# Patient Record
Sex: Female | Born: 1935
Health system: Southern US, Community
[De-identification: ages and names within clinical notes are randomized; demographics above are authoritative.]

## PROBLEM LIST (undated history)

## (undated) DIAGNOSIS — N1330 Unspecified hydronephrosis: Secondary | ICD-10-CM

## (undated) DIAGNOSIS — K922 Gastrointestinal hemorrhage, unspecified: Secondary | ICD-10-CM

## (undated) DIAGNOSIS — M549 Dorsalgia, unspecified: Secondary | ICD-10-CM

## (undated) DIAGNOSIS — I10 Essential (primary) hypertension: Secondary | ICD-10-CM

## (undated) DIAGNOSIS — I471 Supraventricular tachycardia, unspecified: Secondary | ICD-10-CM

## (undated) DIAGNOSIS — F32A Depression, unspecified: Secondary | ICD-10-CM

## (undated) DIAGNOSIS — G43909 Migraine, unspecified, not intractable, without status migrainosus: Secondary | ICD-10-CM

## (undated) DIAGNOSIS — E785 Hyperlipidemia, unspecified: Secondary | ICD-10-CM

## (undated) DIAGNOSIS — R159 Full incontinence of feces: Secondary | ICD-10-CM

## (undated) DIAGNOSIS — R918 Other nonspecific abnormal finding of lung field: Secondary | ICD-10-CM

## (undated) DIAGNOSIS — R51 Headache: Secondary | ICD-10-CM

## (undated) DIAGNOSIS — F419 Anxiety disorder, unspecified: Secondary | ICD-10-CM

## (undated) DIAGNOSIS — M25569 Pain in unspecified knee: Secondary | ICD-10-CM

## (undated) DIAGNOSIS — R519 Headache, unspecified: Secondary | ICD-10-CM

## (undated) DIAGNOSIS — F329 Major depressive disorder, single episode, unspecified: Secondary | ICD-10-CM

## (undated) DIAGNOSIS — L821 Other seborrheic keratosis: Secondary | ICD-10-CM

## (undated) DIAGNOSIS — M810 Age-related osteoporosis without current pathological fracture: Secondary | ICD-10-CM

## (undated) HISTORY — DX: Full incontinence of feces: R15.9

## (undated) HISTORY — DX: Depression, unspecified: F32.A

## (undated) HISTORY — PX: ABDOMINAL HYSTERECTOMY: SHX81

## (undated) HISTORY — DX: Dorsalgia, unspecified: M54.9

## (undated) HISTORY — PX: TOTAL KNEE ARTHROPLASTY: SHX125

## (undated) HISTORY — DX: Supraventricular tachycardia, unspecified: I47.10

## (undated) HISTORY — DX: Unspecified hydronephrosis: N13.30

## (undated) HISTORY — DX: Gastrointestinal hemorrhage, unspecified: K92.2

## (undated) HISTORY — DX: Other seborrheic keratosis: L82.1

## (undated) HISTORY — DX: Headache: R51

## (undated) HISTORY — DX: Major depressive disorder, single episode, unspecified: F32.9

## (undated) HISTORY — DX: Essential (primary) hypertension: I10

## (undated) HISTORY — PX: OTHER SURGICAL HISTORY: SHX169

## (undated) HISTORY — DX: Age-related osteoporosis without current pathological fracture: M81.0

## (undated) HISTORY — DX: Pain in unspecified knee: M25.569

## (undated) HISTORY — DX: Anxiety disorder, unspecified: F41.9

## (undated) HISTORY — DX: Hyperlipidemia, unspecified: E78.5

## (undated) HISTORY — PX: BLADDER REPAIR: SHX76

## (undated) HISTORY — DX: Other nonspecific abnormal finding of lung field: R91.8

## (undated) HISTORY — PX: BASAL CELL CARCINOMA EXCISION: SHX1214

## (undated) HISTORY — PX: CHOLECYSTECTOMY: SHX55

## (undated) HISTORY — DX: Headache, unspecified: R51.9

## (undated) HISTORY — DX: Supraventricular tachycardia: I47.1

## (undated) HISTORY — PX: APPENDECTOMY: SHX54

## (undated) HISTORY — DX: Migraine, unspecified, not intractable, without status migrainosus: G43.909

## (undated) HISTORY — PX: TONSILLECTOMY: SUR1361

---

## 2002-11-02 ENCOUNTER — Encounter: Payer: Self-pay | Admitting: Internal Medicine

## 2002-11-02 ENCOUNTER — Encounter: Admission: RE | Admit: 2002-11-02 | Discharge: 2002-11-02 | Payer: Self-pay | Admitting: Internal Medicine

## 2002-11-19 ENCOUNTER — Inpatient Hospital Stay (HOSPITAL_COMMUNITY): Admission: EM | Admit: 2002-11-19 | Discharge: 2002-11-23 | Payer: Self-pay

## 2002-11-20 ENCOUNTER — Encounter: Payer: Self-pay | Admitting: Surgery

## 2002-11-21 ENCOUNTER — Encounter: Payer: Self-pay | Admitting: Surgery

## 2002-11-22 ENCOUNTER — Encounter: Payer: Self-pay | Admitting: Surgery

## 2002-12-03 ENCOUNTER — Encounter: Admission: RE | Admit: 2002-12-03 | Discharge: 2002-12-03 | Payer: Self-pay | Admitting: Surgery

## 2002-12-03 ENCOUNTER — Encounter: Payer: Self-pay | Admitting: Surgery

## 2003-03-05 ENCOUNTER — Encounter: Payer: Self-pay | Admitting: Gastroenterology

## 2003-03-05 ENCOUNTER — Encounter: Payer: Self-pay | Admitting: Internal Medicine

## 2003-03-18 ENCOUNTER — Ambulatory Visit (HOSPITAL_COMMUNITY): Admission: RE | Admit: 2003-03-18 | Discharge: 2003-03-18 | Payer: Self-pay | Admitting: Gastroenterology

## 2003-04-04 ENCOUNTER — Ambulatory Visit (HOSPITAL_COMMUNITY): Admission: RE | Admit: 2003-04-04 | Discharge: 2003-04-04 | Payer: Self-pay | Admitting: Gastroenterology

## 2003-04-04 ENCOUNTER — Encounter: Payer: Self-pay | Admitting: Gastroenterology

## 2003-05-04 ENCOUNTER — Inpatient Hospital Stay (HOSPITAL_COMMUNITY): Admission: EM | Admit: 2003-05-04 | Discharge: 2003-05-06 | Payer: Self-pay | Admitting: Emergency Medicine

## 2003-05-20 ENCOUNTER — Ambulatory Visit (HOSPITAL_COMMUNITY): Admission: RE | Admit: 2003-05-20 | Discharge: 2003-05-20 | Payer: Self-pay | Admitting: Internal Medicine

## 2003-07-05 ENCOUNTER — Ambulatory Visit (HOSPITAL_COMMUNITY): Admission: RE | Admit: 2003-07-05 | Discharge: 2003-07-05 | Payer: Self-pay | Admitting: Gastroenterology

## 2003-07-08 ENCOUNTER — Inpatient Hospital Stay (HOSPITAL_COMMUNITY): Admission: AD | Admit: 2003-07-08 | Discharge: 2003-07-24 | Payer: Self-pay | Admitting: Surgery

## 2003-07-11 ENCOUNTER — Encounter (INDEPENDENT_AMBULATORY_CARE_PROVIDER_SITE_OTHER): Payer: Self-pay | Admitting: Specialist

## 2003-08-27 ENCOUNTER — Emergency Department (HOSPITAL_COMMUNITY): Admission: EM | Admit: 2003-08-27 | Discharge: 2003-08-28 | Payer: Self-pay | Admitting: Emergency Medicine

## 2003-10-07 ENCOUNTER — Inpatient Hospital Stay (HOSPITAL_COMMUNITY): Admission: EM | Admit: 2003-10-07 | Discharge: 2003-10-08 | Payer: Self-pay | Admitting: Emergency Medicine

## 2003-11-04 ENCOUNTER — Encounter: Admission: RE | Admit: 2003-11-04 | Discharge: 2003-11-04 | Payer: Self-pay | Admitting: Internal Medicine

## 2003-11-10 ENCOUNTER — Ambulatory Visit (HOSPITAL_COMMUNITY): Admission: RE | Admit: 2003-11-10 | Discharge: 2003-11-10 | Payer: Self-pay | Admitting: Internal Medicine

## 2004-02-27 ENCOUNTER — Ambulatory Visit: Payer: Self-pay | Admitting: Internal Medicine

## 2004-03-13 ENCOUNTER — Ambulatory Visit: Payer: Self-pay | Admitting: Internal Medicine

## 2004-03-18 ENCOUNTER — Ambulatory Visit: Payer: Self-pay | Admitting: Cardiology

## 2004-04-07 ENCOUNTER — Ambulatory Visit: Payer: Self-pay | Admitting: Internal Medicine

## 2004-04-16 ENCOUNTER — Ambulatory Visit: Payer: Self-pay | Admitting: Internal Medicine

## 2004-04-22 ENCOUNTER — Encounter: Admission: RE | Admit: 2004-04-22 | Discharge: 2004-04-22 | Payer: Self-pay | Admitting: Internal Medicine

## 2004-04-26 ENCOUNTER — Encounter: Admission: RE | Admit: 2004-04-26 | Discharge: 2004-04-26 | Payer: Self-pay | Admitting: Neurology

## 2004-04-28 ENCOUNTER — Ambulatory Visit: Payer: Self-pay | Admitting: Internal Medicine

## 2004-06-26 ENCOUNTER — Ambulatory Visit: Payer: Self-pay | Admitting: Internal Medicine

## 2004-07-12 ENCOUNTER — Inpatient Hospital Stay (HOSPITAL_COMMUNITY): Admission: EM | Admit: 2004-07-12 | Discharge: 2004-07-14 | Payer: Self-pay | Admitting: *Deleted

## 2004-07-23 ENCOUNTER — Ambulatory Visit: Payer: Self-pay | Admitting: Gastroenterology

## 2004-09-04 ENCOUNTER — Ambulatory Visit: Payer: Self-pay | Admitting: Gastroenterology

## 2004-09-23 ENCOUNTER — Other Ambulatory Visit: Admission: RE | Admit: 2004-09-23 | Discharge: 2004-09-23 | Payer: Self-pay | Admitting: Obstetrics and Gynecology

## 2004-10-08 ENCOUNTER — Ambulatory Visit: Payer: Self-pay | Admitting: Internal Medicine

## 2004-10-22 ENCOUNTER — Ambulatory Visit: Payer: Self-pay | Admitting: Licensed Clinical Social Worker

## 2004-11-05 ENCOUNTER — Ambulatory Visit: Payer: Self-pay | Admitting: Licensed Clinical Social Worker

## 2004-11-19 ENCOUNTER — Ambulatory Visit: Payer: Self-pay | Admitting: Internal Medicine

## 2004-12-15 ENCOUNTER — Encounter: Admission: RE | Admit: 2004-12-15 | Discharge: 2004-12-15 | Payer: Self-pay | Admitting: Internal Medicine

## 2004-12-28 ENCOUNTER — Encounter: Admission: RE | Admit: 2004-12-28 | Discharge: 2004-12-28 | Payer: Self-pay | Admitting: Internal Medicine

## 2004-12-31 ENCOUNTER — Ambulatory Visit: Payer: Self-pay | Admitting: Internal Medicine

## 2005-01-21 ENCOUNTER — Ambulatory Visit: Payer: Self-pay | Admitting: Internal Medicine

## 2005-02-08 ENCOUNTER — Ambulatory Visit: Payer: Self-pay | Admitting: Internal Medicine

## 2005-02-10 ENCOUNTER — Inpatient Hospital Stay (HOSPITAL_COMMUNITY): Admission: AD | Admit: 2005-02-10 | Discharge: 2005-02-11 | Payer: Self-pay | Admitting: Gastroenterology

## 2005-02-10 ENCOUNTER — Ambulatory Visit: Payer: Self-pay | Admitting: Gastroenterology

## 2005-02-11 ENCOUNTER — Ambulatory Visit: Payer: Self-pay | Admitting: Internal Medicine

## 2005-02-23 ENCOUNTER — Ambulatory Visit: Payer: Self-pay | Admitting: Cardiology

## 2005-02-26 ENCOUNTER — Ambulatory Visit: Payer: Self-pay | Admitting: Gastroenterology

## 2005-03-25 ENCOUNTER — Ambulatory Visit: Payer: Self-pay | Admitting: Gastroenterology

## 2005-03-29 ENCOUNTER — Ambulatory Visit: Payer: Self-pay | Admitting: Internal Medicine

## 2005-04-01 ENCOUNTER — Ambulatory Visit: Payer: Self-pay | Admitting: Internal Medicine

## 2005-05-03 ENCOUNTER — Ambulatory Visit: Payer: Self-pay | Admitting: Internal Medicine

## 2005-06-04 ENCOUNTER — Ambulatory Visit: Payer: Self-pay | Admitting: Internal Medicine

## 2005-08-04 ENCOUNTER — Ambulatory Visit: Payer: Self-pay | Admitting: Internal Medicine

## 2005-08-12 ENCOUNTER — Ambulatory Visit: Payer: Self-pay | Admitting: Internal Medicine

## 2005-08-17 ENCOUNTER — Emergency Department (HOSPITAL_COMMUNITY): Admission: EM | Admit: 2005-08-17 | Discharge: 2005-08-17 | Payer: Self-pay | Admitting: Emergency Medicine

## 2005-09-20 ENCOUNTER — Ambulatory Visit: Payer: Self-pay | Admitting: Internal Medicine

## 2005-10-27 ENCOUNTER — Ambulatory Visit: Payer: Self-pay | Admitting: Internal Medicine

## 2005-12-17 ENCOUNTER — Ambulatory Visit: Payer: Self-pay | Admitting: Internal Medicine

## 2006-01-10 ENCOUNTER — Encounter: Admission: RE | Admit: 2006-01-10 | Discharge: 2006-01-24 | Payer: Self-pay | Admitting: Sports Medicine

## 2006-01-10 ENCOUNTER — Ambulatory Visit: Payer: Self-pay | Admitting: Internal Medicine

## 2006-01-17 ENCOUNTER — Encounter: Admission: RE | Admit: 2006-01-17 | Discharge: 2006-01-17 | Payer: Self-pay | Admitting: Internal Medicine

## 2006-01-18 ENCOUNTER — Ambulatory Visit: Payer: Self-pay | Admitting: Internal Medicine

## 2006-01-25 ENCOUNTER — Encounter: Admission: RE | Admit: 2006-01-25 | Discharge: 2006-02-24 | Payer: Self-pay | Admitting: Sports Medicine

## 2006-02-10 ENCOUNTER — Ambulatory Visit: Payer: Self-pay | Admitting: Internal Medicine

## 2006-02-10 LAB — CONVERTED CEMR LAB
Folate: >20 ng/mL
Glucose, Bld: 95 mg/dL (ref 70–99)
HCT: 36.5 % (ref 36.0–46.0)
Platelets: 370 10*3/uL (ref 150–400)
TSH: 1.29 microintl units/mL (ref 0.35–5.50)
Total CK: 25 units/L (ref 7–177)
WBC: 5.4 10*3/uL (ref 4.5–10.5)

## 2006-02-24 ENCOUNTER — Ambulatory Visit: Payer: Self-pay | Admitting: Internal Medicine

## 2006-02-25 ENCOUNTER — Encounter: Admission: RE | Admit: 2006-02-25 | Discharge: 2006-03-29 | Payer: Self-pay | Admitting: Sports Medicine

## 2006-03-24 ENCOUNTER — Ambulatory Visit: Payer: Self-pay | Admitting: Internal Medicine

## 2006-04-19 HISTORY — PX: CATARACT EXTRACTION: SUR2

## 2006-04-26 DIAGNOSIS — I471 Supraventricular tachycardia, unspecified: Secondary | ICD-10-CM | POA: Insufficient documentation

## 2006-04-26 DIAGNOSIS — R42 Dizziness and giddiness: Secondary | ICD-10-CM | POA: Insufficient documentation

## 2006-04-26 DIAGNOSIS — E785 Hyperlipidemia, unspecified: Secondary | ICD-10-CM | POA: Insufficient documentation

## 2006-04-26 DIAGNOSIS — Z96659 Presence of unspecified artificial knee joint: Secondary | ICD-10-CM | POA: Insufficient documentation

## 2006-04-28 ENCOUNTER — Ambulatory Visit: Payer: Self-pay | Admitting: Internal Medicine

## 2006-06-07 ENCOUNTER — Ambulatory Visit: Payer: Self-pay | Admitting: Internal Medicine

## 2006-06-09 ENCOUNTER — Ambulatory Visit: Payer: Self-pay | Admitting: Internal Medicine

## 2006-06-11 ENCOUNTER — Emergency Department (HOSPITAL_COMMUNITY): Admission: EM | Admit: 2006-06-11 | Discharge: 2006-06-12 | Payer: Self-pay | Admitting: Emergency Medicine

## 2006-06-30 ENCOUNTER — Encounter
Admission: RE | Admit: 2006-06-30 | Discharge: 2006-09-28 | Payer: Self-pay | Admitting: Physical Medicine and Rehabilitation

## 2006-07-01 ENCOUNTER — Ambulatory Visit: Payer: Self-pay | Admitting: Physical Medicine and Rehabilitation

## 2006-07-04 ENCOUNTER — Ambulatory Visit (HOSPITAL_COMMUNITY): Admission: RE | Admit: 2006-07-04 | Discharge: 2006-07-04 | Payer: Self-pay | Admitting: Internal Medicine

## 2006-07-07 ENCOUNTER — Ambulatory Visit: Payer: Self-pay | Admitting: Internal Medicine

## 2006-08-01 ENCOUNTER — Ambulatory Visit (HOSPITAL_COMMUNITY)
Admission: RE | Admit: 2006-08-01 | Discharge: 2006-08-01 | Payer: Self-pay | Admitting: Physical Medicine and Rehabilitation

## 2006-08-08 ENCOUNTER — Encounter: Admission: RE | Admit: 2006-08-08 | Discharge: 2006-08-08 | Payer: Self-pay | Admitting: Internal Medicine

## 2006-08-20 ENCOUNTER — Ambulatory Visit: Payer: Self-pay | Admitting: Family Medicine

## 2006-08-22 ENCOUNTER — Ambulatory Visit: Payer: Self-pay | Admitting: Internal Medicine

## 2006-08-23 ENCOUNTER — Ambulatory Visit: Payer: Self-pay | Admitting: Physical Medicine and Rehabilitation

## 2006-08-24 ENCOUNTER — Ambulatory Visit: Payer: Self-pay | Admitting: Internal Medicine

## 2006-08-30 ENCOUNTER — Ambulatory Visit: Payer: Self-pay | Admitting: Internal Medicine

## 2006-08-30 LAB — CONVERTED CEMR LAB
ALT: 9 units/L (ref 0–40)
AST: 15 units/L (ref 0–37)
Albumin: 3.9 g/dL (ref 3.5–5.2)
Calcium: 9.6 mg/dL (ref 8.4–10.5)
Chloride: 101 meq/L (ref 96–112)
Eosinophils Absolute: 0.1 10*3/uL (ref 0.0–0.6)
Eosinophils Relative: 1.4 % (ref 0.0–5.0)
GFR calc non Af Amer: 58 mL/min
Glucose, Bld: 83 mg/dL (ref 70–99)
MCV: 97.2 fL (ref 78.0–100.0)
Platelets: 333 10*3/uL (ref 150–400)
RBC: 4.08 M/uL (ref 3.87–5.11)
WBC: 7.5 10*3/uL (ref 4.5–10.5)

## 2006-09-07 ENCOUNTER — Encounter: Payer: Self-pay | Admitting: Internal Medicine

## 2006-09-08 ENCOUNTER — Ambulatory Visit (HOSPITAL_BASED_OUTPATIENT_CLINIC_OR_DEPARTMENT_OTHER): Admission: RE | Admit: 2006-09-08 | Discharge: 2006-09-08 | Payer: Self-pay | Admitting: Urology

## 2006-09-16 ENCOUNTER — Encounter: Payer: Self-pay | Admitting: Internal Medicine

## 2006-09-19 ENCOUNTER — Telehealth: Payer: Self-pay | Admitting: Internal Medicine

## 2006-09-21 ENCOUNTER — Encounter: Payer: Self-pay | Admitting: Internal Medicine

## 2006-09-21 DIAGNOSIS — N1330 Unspecified hydronephrosis: Secondary | ICD-10-CM | POA: Insufficient documentation

## 2006-10-06 ENCOUNTER — Ambulatory Visit: Payer: Self-pay | Admitting: Internal Medicine

## 2006-10-06 ENCOUNTER — Encounter
Admission: RE | Admit: 2006-10-06 | Discharge: 2006-10-06 | Payer: Self-pay | Admitting: Physical Medicine and Rehabilitation

## 2006-10-06 DIAGNOSIS — R930 Abnormal findings on diagnostic imaging of skull and head, not elsewhere classified: Secondary | ICD-10-CM | POA: Insufficient documentation

## 2006-10-06 DIAGNOSIS — M545 Low back pain, unspecified: Secondary | ICD-10-CM | POA: Insufficient documentation

## 2006-10-06 LAB — CONVERTED CEMR LAB
Bilirubin Urine: NEGATIVE
Specific Gravity, Urine: 1.018 (ref 1.005–1.03)
Urine Glucose: NEGATIVE mg/dL
WBC, UA: NONE SEEN cells/hpf (ref <3)
pH: 6 (ref 5.0–8.0)

## 2006-10-07 ENCOUNTER — Encounter: Payer: Self-pay | Admitting: Internal Medicine

## 2006-10-11 ENCOUNTER — Encounter
Admission: RE | Admit: 2006-10-11 | Discharge: 2007-01-09 | Payer: Self-pay | Admitting: Physical Medicine & Rehabilitation

## 2006-10-13 ENCOUNTER — Ambulatory Visit: Payer: Self-pay | Admitting: Physical Medicine & Rehabilitation

## 2006-10-18 ENCOUNTER — Ambulatory Visit: Payer: Self-pay | Admitting: Internal Medicine

## 2006-10-26 ENCOUNTER — Encounter: Admission: RE | Admit: 2006-10-26 | Discharge: 2006-10-26 | Payer: Self-pay | Admitting: Internal Medicine

## 2006-11-02 ENCOUNTER — Encounter (INDEPENDENT_AMBULATORY_CARE_PROVIDER_SITE_OTHER): Payer: Self-pay | Admitting: *Deleted

## 2006-11-25 ENCOUNTER — Telehealth: Payer: Self-pay | Admitting: Internal Medicine

## 2006-11-30 ENCOUNTER — Telehealth: Payer: Self-pay | Admitting: Internal Medicine

## 2006-12-05 ENCOUNTER — Ambulatory Visit: Payer: Self-pay | Admitting: Physical Medicine & Rehabilitation

## 2006-12-14 ENCOUNTER — Ambulatory Visit: Payer: Self-pay | Admitting: Physical Medicine and Rehabilitation

## 2007-01-10 ENCOUNTER — Encounter
Admission: RE | Admit: 2007-01-10 | Discharge: 2007-04-10 | Payer: Self-pay | Admitting: Physical Medicine and Rehabilitation

## 2007-01-19 ENCOUNTER — Encounter: Admission: RE | Admit: 2007-01-19 | Discharge: 2007-01-19 | Payer: Self-pay | Admitting: Internal Medicine

## 2007-01-24 ENCOUNTER — Telehealth: Payer: Self-pay | Admitting: Internal Medicine

## 2007-02-03 ENCOUNTER — Ambulatory Visit: Payer: Self-pay | Admitting: Internal Medicine

## 2007-02-03 DIAGNOSIS — F411 Generalized anxiety disorder: Secondary | ICD-10-CM | POA: Insufficient documentation

## 2007-02-03 DIAGNOSIS — F329 Major depressive disorder, single episode, unspecified: Secondary | ICD-10-CM

## 2007-02-03 DIAGNOSIS — M25569 Pain in unspecified knee: Secondary | ICD-10-CM | POA: Insufficient documentation

## 2007-02-03 DIAGNOSIS — F32A Depression, unspecified: Secondary | ICD-10-CM | POA: Insufficient documentation

## 2007-02-06 LAB — CONVERTED CEMR LAB
Cholesterol: 149 mg/dL (ref 0–200)
Direct LDL: 69.2 mg/dL

## 2007-02-09 ENCOUNTER — Ambulatory Visit: Payer: Self-pay | Admitting: Physical Medicine and Rehabilitation

## 2007-02-14 ENCOUNTER — Telehealth (INDEPENDENT_AMBULATORY_CARE_PROVIDER_SITE_OTHER): Payer: Self-pay | Admitting: *Deleted

## 2007-02-15 ENCOUNTER — Ambulatory Visit: Payer: Self-pay | Admitting: Internal Medicine

## 2007-02-15 LAB — CONVERTED CEMR LAB
Blood in Urine, dipstick: NEGATIVE
Glucose, Urine, Semiquant: NEGATIVE
Ketones, urine, test strip: NEGATIVE
WBC Urine, dipstick: NEGATIVE
pH: 6

## 2007-02-16 ENCOUNTER — Encounter: Payer: Self-pay | Admitting: Internal Medicine

## 2007-02-21 ENCOUNTER — Encounter
Admission: RE | Admit: 2007-02-21 | Discharge: 2007-03-09 | Payer: Self-pay | Admitting: Physical Medicine and Rehabilitation

## 2007-03-27 ENCOUNTER — Encounter: Payer: Self-pay | Admitting: Internal Medicine

## 2007-03-31 ENCOUNTER — Ambulatory Visit: Payer: Self-pay | Admitting: Physical Medicine and Rehabilitation

## 2007-04-25 ENCOUNTER — Ambulatory Visit: Payer: Self-pay | Admitting: Internal Medicine

## 2007-04-25 DIAGNOSIS — R918 Other nonspecific abnormal finding of lung field: Secondary | ICD-10-CM | POA: Insufficient documentation

## 2007-04-28 ENCOUNTER — Encounter
Admission: RE | Admit: 2007-04-28 | Discharge: 2007-07-27 | Payer: Self-pay | Admitting: Physical Medicine and Rehabilitation

## 2007-05-02 ENCOUNTER — Encounter: Payer: Self-pay | Admitting: Internal Medicine

## 2007-05-02 ENCOUNTER — Telehealth: Payer: Self-pay | Admitting: Internal Medicine

## 2007-05-03 ENCOUNTER — Encounter: Payer: Self-pay | Admitting: Internal Medicine

## 2007-06-12 ENCOUNTER — Telehealth: Payer: Self-pay | Admitting: Internal Medicine

## 2007-06-29 ENCOUNTER — Ambulatory Visit: Payer: Self-pay | Admitting: Physical Medicine and Rehabilitation

## 2007-07-04 ENCOUNTER — Encounter
Admission: RE | Admit: 2007-07-04 | Discharge: 2007-07-12 | Payer: Self-pay | Admitting: Physical Medicine and Rehabilitation

## 2007-07-24 ENCOUNTER — Encounter
Admission: RE | Admit: 2007-07-24 | Discharge: 2007-10-22 | Payer: Self-pay | Admitting: Physical Medicine and Rehabilitation

## 2007-07-25 ENCOUNTER — Ambulatory Visit: Payer: Self-pay | Admitting: Internal Medicine

## 2007-07-26 ENCOUNTER — Ambulatory Visit: Payer: Self-pay | Admitting: Physical Medicine & Rehabilitation

## 2007-07-31 ENCOUNTER — Encounter
Admission: RE | Admit: 2007-07-31 | Discharge: 2007-07-31 | Payer: Self-pay | Admitting: Physical Medicine & Rehabilitation

## 2007-08-10 ENCOUNTER — Ambulatory Visit: Payer: Self-pay | Admitting: Internal Medicine

## 2007-08-10 ENCOUNTER — Telehealth: Payer: Self-pay | Admitting: Internal Medicine

## 2007-08-10 DIAGNOSIS — I10 Essential (primary) hypertension: Secondary | ICD-10-CM | POA: Insufficient documentation

## 2007-08-10 DIAGNOSIS — R519 Headache, unspecified: Secondary | ICD-10-CM | POA: Insufficient documentation

## 2007-08-10 DIAGNOSIS — R51 Headache: Secondary | ICD-10-CM

## 2007-08-10 LAB — CONVERTED CEMR LAB
Bacteria, UA: NONE SEEN
Bilirubin Urine: NEGATIVE
Glucose, Urine, Semiquant: NEGATIVE
Protein, U semiquant: NEGATIVE
RBC / HPF: NONE SEEN (ref <3)
Specific Gravity, Urine: 1.015
WBC, UA: NONE SEEN cells/hpf (ref <3)

## 2007-08-11 ENCOUNTER — Encounter: Payer: Self-pay | Admitting: Internal Medicine

## 2007-08-11 ENCOUNTER — Telehealth: Payer: Self-pay | Admitting: Internal Medicine

## 2007-08-14 ENCOUNTER — Telehealth: Payer: Self-pay | Admitting: Internal Medicine

## 2007-08-15 ENCOUNTER — Ambulatory Visit: Payer: Self-pay | Admitting: Internal Medicine

## 2007-08-18 LAB — CONVERTED CEMR LAB
Chloride: 105 meq/L (ref 96–112)
Creatinine, Ser: 0.9 mg/dL (ref 0.4–1.2)
GFR calc Af Amer: 78 mL/min
GFR calc non Af Amer: 65 mL/min
TSH: 1.99 microintl units/mL (ref 0.35–5.50)

## 2007-08-23 ENCOUNTER — Ambulatory Visit: Payer: Self-pay | Admitting: Physical Medicine and Rehabilitation

## 2007-08-31 ENCOUNTER — Ambulatory Visit: Payer: Self-pay | Admitting: Internal Medicine

## 2007-09-18 ENCOUNTER — Ambulatory Visit: Payer: Self-pay | Admitting: Physical Medicine and Rehabilitation

## 2007-09-28 ENCOUNTER — Encounter
Admission: RE | Admit: 2007-09-28 | Discharge: 2007-09-28 | Payer: Self-pay | Admitting: Physical Medicine & Rehabilitation

## 2007-09-28 ENCOUNTER — Ambulatory Visit: Payer: Self-pay | Admitting: Physical Medicine & Rehabilitation

## 2007-10-03 ENCOUNTER — Encounter: Payer: Self-pay | Admitting: Internal Medicine

## 2007-10-16 ENCOUNTER — Ambulatory Visit: Payer: Self-pay | Admitting: Internal Medicine

## 2007-10-19 ENCOUNTER — Encounter (INDEPENDENT_AMBULATORY_CARE_PROVIDER_SITE_OTHER): Payer: Self-pay | Admitting: *Deleted

## 2007-10-19 LAB — CONVERTED CEMR LAB
Basophils Relative: 0.8 % (ref 0.0–1.0)
Eosinophils Absolute: 0.2 10*3/uL (ref 0.0–0.7)
Eosinophils Relative: 2.8 % (ref 0.0–5.0)
HCT: 37.7 % (ref 36.0–46.0)
HDL: 63.2 mg/dL (ref >39.0)
Hemoglobin: 12.6 g/dL (ref 12.0–15.0)
MCV: 97.9 fL (ref 78.0–100.0)
Monocytes Absolute: 0.5 10*3/uL (ref 0.1–1.0)
Monocytes Relative: 7.8 % (ref 3.0–12.0)
Neutro Abs: 4.1 10*3/uL (ref 1.4–7.7)
RBC: 3.85 M/uL — ABNORMAL LOW (ref 3.87–5.11)
Total CHOL/HDL Ratio: 2.6
WBC: 6.5 10*3/uL (ref 4.5–10.5)

## 2007-10-24 ENCOUNTER — Encounter
Admission: RE | Admit: 2007-10-24 | Discharge: 2007-12-01 | Payer: Self-pay | Admitting: Physical Medicine and Rehabilitation

## 2007-10-25 ENCOUNTER — Ambulatory Visit: Payer: Self-pay | Admitting: Physical Medicine and Rehabilitation

## 2007-11-20 ENCOUNTER — Ambulatory Visit: Payer: Self-pay | Admitting: Internal Medicine

## 2007-11-20 DIAGNOSIS — R002 Palpitations: Secondary | ICD-10-CM | POA: Insufficient documentation

## 2007-11-21 ENCOUNTER — Encounter (INDEPENDENT_AMBULATORY_CARE_PROVIDER_SITE_OTHER): Payer: Self-pay | Admitting: *Deleted

## 2007-12-01 ENCOUNTER — Ambulatory Visit: Payer: Self-pay | Admitting: Physical Medicine and Rehabilitation

## 2007-12-01 ENCOUNTER — Ambulatory Visit: Payer: Self-pay

## 2007-12-01 ENCOUNTER — Encounter: Payer: Self-pay | Admitting: Internal Medicine

## 2007-12-11 ENCOUNTER — Telehealth (INDEPENDENT_AMBULATORY_CARE_PROVIDER_SITE_OTHER): Payer: Self-pay | Admitting: *Deleted

## 2008-01-18 ENCOUNTER — Ambulatory Visit: Payer: Self-pay | Admitting: Internal Medicine

## 2008-01-22 ENCOUNTER — Encounter: Admission: RE | Admit: 2008-01-22 | Discharge: 2008-01-22 | Payer: Self-pay | Admitting: Internal Medicine

## 2008-01-22 ENCOUNTER — Encounter (INDEPENDENT_AMBULATORY_CARE_PROVIDER_SITE_OTHER): Payer: Self-pay | Admitting: *Deleted

## 2008-01-22 LAB — HM MAMMOGRAPHY: HM Mammogram: NEGATIVE

## 2008-01-23 ENCOUNTER — Encounter (INDEPENDENT_AMBULATORY_CARE_PROVIDER_SITE_OTHER): Payer: Self-pay | Admitting: *Deleted

## 2008-01-23 ENCOUNTER — Encounter
Admission: RE | Admit: 2008-01-23 | Discharge: 2008-01-24 | Payer: Self-pay | Admitting: Physical Medicine and Rehabilitation

## 2008-01-23 LAB — CONVERTED CEMR LAB
CO2: 25 meq/L (ref 19–32)
Calcium: 9.7 mg/dL (ref 8.4–10.5)
Chloride: 106 meq/L (ref 96–112)
Creatinine, Ser: 1 mg/dL (ref 0.4–1.2)
Glucose, Bld: 86 mg/dL (ref 70–99)

## 2008-01-24 ENCOUNTER — Ambulatory Visit: Payer: Self-pay | Admitting: Physical Medicine and Rehabilitation

## 2008-01-24 ENCOUNTER — Telehealth: Payer: Self-pay | Admitting: Internal Medicine

## 2008-02-07 ENCOUNTER — Encounter: Payer: Self-pay | Admitting: Internal Medicine

## 2008-02-07 ENCOUNTER — Ambulatory Visit: Payer: Self-pay | Admitting: Internal Medicine

## 2008-03-04 ENCOUNTER — Encounter
Admission: RE | Admit: 2008-03-04 | Discharge: 2008-03-08 | Payer: Self-pay | Admitting: Physical Medicine & Rehabilitation

## 2008-03-04 ENCOUNTER — Ambulatory Visit: Payer: Self-pay | Admitting: Physical Medicine & Rehabilitation

## 2008-03-08 ENCOUNTER — Ambulatory Visit: Payer: Self-pay | Admitting: Physical Medicine & Rehabilitation

## 2008-03-12 ENCOUNTER — Telehealth (INDEPENDENT_AMBULATORY_CARE_PROVIDER_SITE_OTHER): Payer: Self-pay | Admitting: *Deleted

## 2008-04-23 ENCOUNTER — Encounter
Admission: RE | Admit: 2008-04-23 | Discharge: 2008-07-22 | Payer: Self-pay | Admitting: Physical Medicine and Rehabilitation

## 2008-04-24 ENCOUNTER — Ambulatory Visit: Payer: Self-pay | Admitting: Physical Medicine and Rehabilitation

## 2008-04-26 ENCOUNTER — Ambulatory Visit: Payer: Self-pay | Admitting: Internal Medicine

## 2008-05-07 ENCOUNTER — Telehealth (INDEPENDENT_AMBULATORY_CARE_PROVIDER_SITE_OTHER): Payer: Self-pay | Admitting: *Deleted

## 2008-05-09 ENCOUNTER — Encounter (INDEPENDENT_AMBULATORY_CARE_PROVIDER_SITE_OTHER): Payer: Self-pay | Admitting: *Deleted

## 2008-05-15 ENCOUNTER — Encounter: Payer: Self-pay | Admitting: Internal Medicine

## 2008-05-22 ENCOUNTER — Ambulatory Visit: Payer: Self-pay | Admitting: Physical Medicine and Rehabilitation

## 2008-06-17 ENCOUNTER — Telehealth (INDEPENDENT_AMBULATORY_CARE_PROVIDER_SITE_OTHER): Payer: Self-pay | Admitting: *Deleted

## 2008-06-19 ENCOUNTER — Ambulatory Visit: Payer: Self-pay | Admitting: Physical Medicine and Rehabilitation

## 2008-07-17 ENCOUNTER — Ambulatory Visit: Payer: Self-pay | Admitting: Internal Medicine

## 2008-07-18 ENCOUNTER — Telehealth: Payer: Self-pay | Admitting: Internal Medicine

## 2008-07-23 ENCOUNTER — Ambulatory Visit: Payer: Self-pay | Admitting: Internal Medicine

## 2008-07-25 ENCOUNTER — Encounter
Admission: RE | Admit: 2008-07-25 | Discharge: 2008-07-29 | Payer: Self-pay | Admitting: Physical Medicine and Rehabilitation

## 2008-07-29 ENCOUNTER — Telehealth (INDEPENDENT_AMBULATORY_CARE_PROVIDER_SITE_OTHER): Payer: Self-pay | Admitting: *Deleted

## 2008-07-29 ENCOUNTER — Ambulatory Visit: Payer: Self-pay | Admitting: Physical Medicine and Rehabilitation

## 2008-08-01 ENCOUNTER — Encounter
Admission: RE | Admit: 2008-08-01 | Discharge: 2008-08-01 | Payer: Self-pay | Admitting: Physical Medicine & Rehabilitation

## 2008-08-01 ENCOUNTER — Ambulatory Visit: Payer: Self-pay | Admitting: Physical Medicine & Rehabilitation

## 2008-08-06 ENCOUNTER — Encounter: Payer: Self-pay | Admitting: Internal Medicine

## 2008-08-13 ENCOUNTER — Encounter: Admission: RE | Admit: 2008-08-13 | Discharge: 2008-08-13 | Payer: Self-pay | Admitting: Neurological Surgery

## 2008-08-16 ENCOUNTER — Telehealth (INDEPENDENT_AMBULATORY_CARE_PROVIDER_SITE_OTHER): Payer: Self-pay | Admitting: *Deleted

## 2008-08-20 ENCOUNTER — Telehealth (INDEPENDENT_AMBULATORY_CARE_PROVIDER_SITE_OTHER): Payer: Self-pay | Admitting: *Deleted

## 2008-08-28 ENCOUNTER — Encounter: Payer: Self-pay | Admitting: Internal Medicine

## 2008-09-10 ENCOUNTER — Telehealth (INDEPENDENT_AMBULATORY_CARE_PROVIDER_SITE_OTHER): Payer: Self-pay | Admitting: *Deleted

## 2008-09-24 ENCOUNTER — Encounter: Payer: Self-pay | Admitting: Internal Medicine

## 2008-10-01 ENCOUNTER — Encounter: Admission: RE | Admit: 2008-10-01 | Discharge: 2008-10-01 | Payer: Self-pay | Admitting: Neurosurgery

## 2008-10-10 ENCOUNTER — Telehealth: Payer: Self-pay | Admitting: Internal Medicine

## 2008-11-01 ENCOUNTER — Telehealth (INDEPENDENT_AMBULATORY_CARE_PROVIDER_SITE_OTHER): Payer: Self-pay | Admitting: *Deleted

## 2008-11-13 ENCOUNTER — Encounter: Payer: Self-pay | Admitting: Internal Medicine

## 2008-11-21 ENCOUNTER — Telehealth (INDEPENDENT_AMBULATORY_CARE_PROVIDER_SITE_OTHER): Payer: Self-pay | Admitting: *Deleted

## 2008-11-25 ENCOUNTER — Telehealth: Payer: Self-pay | Admitting: Internal Medicine

## 2008-12-02 ENCOUNTER — Telehealth: Payer: Self-pay | Admitting: Internal Medicine

## 2008-12-04 ENCOUNTER — Encounter: Payer: Self-pay | Admitting: Internal Medicine

## 2008-12-05 ENCOUNTER — Ambulatory Visit: Payer: Self-pay | Admitting: Internal Medicine

## 2008-12-11 ENCOUNTER — Ambulatory Visit: Payer: Self-pay | Admitting: Internal Medicine

## 2008-12-16 LAB — CONVERTED CEMR LAB
ALT: 12 units/L (ref 0–35)
AST: 21 units/L (ref 0–37)
CO2: 30 meq/L (ref 19–32)
Calcium: 9.1 mg/dL (ref 8.4–10.5)
Chloride: 105 meq/L (ref 96–112)
Creatinine, Ser: 1 mg/dL (ref 0.4–1.2)
Glucose, Bld: 81 mg/dL (ref 70–99)
HDL: 62.2 mg/dL (ref >39.00)
Total CHOL/HDL Ratio: 2
Triglycerides: 212 mg/dL — ABNORMAL HIGH (ref 0.0–149.0)

## 2008-12-21 ENCOUNTER — Encounter: Payer: Self-pay | Admitting: Internal Medicine

## 2009-01-07 ENCOUNTER — Inpatient Hospital Stay (HOSPITAL_COMMUNITY): Admission: RE | Admit: 2009-01-07 | Discharge: 2009-01-13 | Payer: Self-pay | Admitting: Neurosurgery

## 2009-01-07 HISTORY — PX: OTHER SURGICAL HISTORY: SHX169

## 2009-01-20 ENCOUNTER — Telehealth (INDEPENDENT_AMBULATORY_CARE_PROVIDER_SITE_OTHER): Payer: Self-pay | Admitting: *Deleted

## 2009-01-24 ENCOUNTER — Encounter: Payer: Self-pay | Admitting: Internal Medicine

## 2009-01-27 ENCOUNTER — Encounter: Payer: Self-pay | Admitting: Internal Medicine

## 2009-02-11 ENCOUNTER — Ambulatory Visit: Payer: Self-pay | Admitting: Internal Medicine

## 2009-02-20 ENCOUNTER — Telehealth (INDEPENDENT_AMBULATORY_CARE_PROVIDER_SITE_OTHER): Payer: Self-pay | Admitting: *Deleted

## 2009-03-03 ENCOUNTER — Encounter: Payer: Self-pay | Admitting: Internal Medicine

## 2009-03-07 ENCOUNTER — Telehealth: Payer: Self-pay | Admitting: Internal Medicine

## 2009-03-11 ENCOUNTER — Telehealth: Payer: Self-pay | Admitting: Internal Medicine

## 2009-03-31 ENCOUNTER — Telehealth (INDEPENDENT_AMBULATORY_CARE_PROVIDER_SITE_OTHER): Payer: Self-pay | Admitting: *Deleted

## 2009-04-07 ENCOUNTER — Encounter: Payer: Self-pay | Admitting: Internal Medicine

## 2009-04-08 ENCOUNTER — Encounter: Admission: RE | Admit: 2009-04-08 | Discharge: 2009-04-08 | Payer: Self-pay | Admitting: Internal Medicine

## 2009-04-14 ENCOUNTER — Encounter (INDEPENDENT_AMBULATORY_CARE_PROVIDER_SITE_OTHER): Payer: Self-pay | Admitting: *Deleted

## 2009-05-13 ENCOUNTER — Ambulatory Visit: Payer: Self-pay | Admitting: Internal Medicine

## 2009-06-04 ENCOUNTER — Encounter: Payer: Self-pay | Admitting: Internal Medicine

## 2009-06-10 ENCOUNTER — Ambulatory Visit: Payer: Self-pay | Admitting: Internal Medicine

## 2009-06-12 LAB — CONVERTED CEMR LAB
ALT: 11 units/L (ref 0–35)
AST: 17 units/L (ref 0–37)
BUN: 16 mg/dL (ref 6–23)
Basophils Absolute: 0 10*3/uL (ref 0.0–0.1)
Calcium: 9.4 mg/dL (ref 8.4–10.5)
Cholesterol: 173 mg/dL (ref 0–200)
Eosinophils Relative: 1.3 % (ref 0.0–5.0)
GFR calc non Af Amer: 57.09 mL/min (ref >60)
Glucose, Bld: 86 mg/dL (ref 70–99)
HCT: 37.6 % (ref 36.0–46.0)
Lymphocytes Relative: 16.9 % (ref 12.0–46.0)
Monocytes Relative: 7.8 % (ref 3.0–12.0)
Platelets: 291 10*3/uL (ref 150.0–400.0)
Potassium: 4.4 meq/L (ref 3.5–5.1)
RDW: 14.2 % (ref 11.5–14.6)
Sodium: 142 meq/L (ref 135–145)
TSH: 1.53 microintl units/mL (ref 0.35–5.50)
Triglycerides: 159 mg/dL — ABNORMAL HIGH (ref 0.0–149.0)
WBC: 6.6 10*3/uL (ref 4.5–10.5)

## 2009-06-18 ENCOUNTER — Encounter: Payer: Self-pay | Admitting: Internal Medicine

## 2009-06-30 ENCOUNTER — Encounter: Payer: Self-pay | Admitting: Internal Medicine

## 2009-06-30 ENCOUNTER — Ambulatory Visit: Payer: Self-pay | Admitting: Family Medicine

## 2009-08-05 ENCOUNTER — Encounter (INDEPENDENT_AMBULATORY_CARE_PROVIDER_SITE_OTHER): Payer: Self-pay | Admitting: *Deleted

## 2009-08-14 ENCOUNTER — Encounter (INDEPENDENT_AMBULATORY_CARE_PROVIDER_SITE_OTHER): Payer: Self-pay | Admitting: *Deleted

## 2009-09-02 ENCOUNTER — Ambulatory Visit: Payer: Self-pay | Admitting: Gastroenterology

## 2009-09-02 DIAGNOSIS — R159 Full incontinence of feces: Secondary | ICD-10-CM | POA: Insufficient documentation

## 2009-09-02 DIAGNOSIS — Z860100 Personal history of colon polyps, unspecified: Secondary | ICD-10-CM | POA: Insufficient documentation

## 2009-09-02 DIAGNOSIS — K5730 Diverticulosis of large intestine without perforation or abscess without bleeding: Secondary | ICD-10-CM | POA: Insufficient documentation

## 2009-09-05 ENCOUNTER — Ambulatory Visit: Payer: Self-pay | Admitting: Internal Medicine

## 2009-09-12 ENCOUNTER — Ambulatory Visit: Payer: Self-pay | Admitting: Gastroenterology

## 2009-09-19 ENCOUNTER — Encounter: Admission: RE | Admit: 2009-09-19 | Discharge: 2009-09-19 | Payer: Self-pay | Admitting: Sports Medicine

## 2009-10-07 ENCOUNTER — Ambulatory Visit: Payer: Self-pay | Admitting: Internal Medicine

## 2009-10-07 DIAGNOSIS — L259 Unspecified contact dermatitis, unspecified cause: Secondary | ICD-10-CM | POA: Insufficient documentation

## 2009-10-15 ENCOUNTER — Ambulatory Visit: Payer: Self-pay | Admitting: Gastroenterology

## 2009-10-15 DIAGNOSIS — K5909 Other constipation: Secondary | ICD-10-CM | POA: Insufficient documentation

## 2009-10-17 ENCOUNTER — Telehealth: Payer: Self-pay | Admitting: Internal Medicine

## 2009-10-22 ENCOUNTER — Encounter: Payer: Self-pay | Admitting: Internal Medicine

## 2009-11-25 ENCOUNTER — Telehealth (INDEPENDENT_AMBULATORY_CARE_PROVIDER_SITE_OTHER): Payer: Self-pay | Admitting: *Deleted

## 2009-12-04 ENCOUNTER — Ambulatory Visit: Payer: Self-pay | Admitting: Internal Medicine

## 2009-12-10 ENCOUNTER — Encounter: Payer: Self-pay | Admitting: Internal Medicine

## 2009-12-18 HISTORY — PX: ROTATOR CUFF REPAIR: SHX139

## 2010-01-02 ENCOUNTER — Encounter: Payer: Self-pay | Admitting: Internal Medicine

## 2010-01-20 ENCOUNTER — Telehealth: Payer: Self-pay | Admitting: Gastroenterology

## 2010-01-21 ENCOUNTER — Ambulatory Visit: Payer: Self-pay | Admitting: Gastroenterology

## 2010-01-21 ENCOUNTER — Telehealth: Payer: Self-pay | Admitting: Gastroenterology

## 2010-01-21 DIAGNOSIS — K5900 Constipation, unspecified: Secondary | ICD-10-CM | POA: Insufficient documentation

## 2010-01-21 LAB — CONVERTED CEMR LAB
BUN: 15 mg/dL (ref 6–23)
Basophils Absolute: 0.2 10*3/uL — ABNORMAL HIGH (ref 0.0–0.1)
CO2: 31 meq/L (ref 19–32)
Chloride: 102 meq/L (ref 96–112)
Creatinine, Ser: 0.9 mg/dL (ref 0.4–1.2)
Eosinophils Absolute: 0.3 10*3/uL (ref 0.0–0.7)
MCHC: 34.5 g/dL (ref 30.0–36.0)
MCV: 98.6 fL (ref 78.0–100.0)
Monocytes Absolute: 0.7 10*3/uL (ref 0.1–1.0)
Neutrophils Relative %: 65 % (ref 43.0–77.0)
Platelets: 523 10*3/uL — ABNORMAL HIGH (ref 150.0–400.0)

## 2010-02-06 ENCOUNTER — Encounter: Payer: Self-pay | Admitting: Internal Medicine

## 2010-02-09 ENCOUNTER — Telehealth: Payer: Self-pay | Admitting: Gastroenterology

## 2010-02-09 ENCOUNTER — Ambulatory Visit: Payer: Self-pay | Admitting: Family Medicine

## 2010-02-10 LAB — CONVERTED CEMR LAB
BUN: 13 mg/dL (ref 6–23)
Basophils Relative: 0.3 % (ref 0.0–3.0)
Chloride: 100 meq/L (ref 96–112)
Eosinophils Absolute: 0.3 10*3/uL (ref 0.0–0.7)
Eosinophils Relative: 4.5 % (ref 0.0–5.0)
Glucose, Bld: 93 mg/dL (ref 70–99)
Lymphocytes Relative: 22.5 % (ref 12.0–46.0)
Neutrophils Relative %: 61.7 % (ref 43.0–77.0)
Potassium: 5 meq/L (ref 3.5–5.1)
RBC: 3.53 M/uL — ABNORMAL LOW (ref 3.87–5.11)
WBC: 6.4 10*3/uL (ref 4.5–10.5)

## 2010-03-10 ENCOUNTER — Ambulatory Visit: Payer: Self-pay | Admitting: Internal Medicine

## 2010-03-16 ENCOUNTER — Telehealth (INDEPENDENT_AMBULATORY_CARE_PROVIDER_SITE_OTHER): Payer: Self-pay | Admitting: *Deleted

## 2010-03-16 ENCOUNTER — Ambulatory Visit: Payer: Self-pay | Admitting: Family Medicine

## 2010-03-16 LAB — CONVERTED CEMR LAB
Bilirubin Urine: NEGATIVE
Ketones, urine, test strip: NEGATIVE

## 2010-04-09 ENCOUNTER — Encounter
Admission: RE | Admit: 2010-04-09 | Discharge: 2010-04-09 | Payer: Self-pay | Source: Home / Self Care | Attending: Internal Medicine | Admitting: Internal Medicine

## 2010-05-09 ENCOUNTER — Encounter: Payer: Self-pay | Admitting: Gastroenterology

## 2010-05-10 ENCOUNTER — Encounter: Payer: Self-pay | Admitting: Internal Medicine

## 2010-05-17 LAB — CONVERTED CEMR LAB
Basophils Relative: 0.7 % (ref 0.0–3.0)
Bilirubin Urine: NEGATIVE
Blood in Urine, dipstick: NEGATIVE
CO2: 30 meq/L (ref 19–32)
Calcium: 9.8 mg/dL (ref 8.4–10.5)
Eosinophils Absolute: 0.1 10*3/uL (ref 0.0–0.7)
Glucose, Urine, Semiquant: NEGATIVE
HCT: 37.9 % (ref 36.0–46.0)
Hemoglobin: 12.8 g/dL (ref 12.0–15.0)
Ketones, urine, test strip: NEGATIVE
Lymphocytes Relative: 19.5 % (ref 12.0–46.0)
Lymphs Abs: 1.5 10*3/uL (ref 0.7–4.0)
MCHC: 33.8 g/dL (ref 30.0–36.0)
Monocytes Relative: 8.4 % (ref 3.0–12.0)
Neutro Abs: 5.3 10*3/uL (ref 1.4–7.7)
Potassium: 4.2 meq/L (ref 3.5–5.1)
Protein, U semiquant: NEGATIVE
RBC: 3.78 M/uL — ABNORMAL LOW (ref 3.87–5.11)
Sodium: 139 meq/L (ref 135–145)
Urobilinogen, UA: NEGATIVE
pH: 6

## 2010-05-19 NOTE — Letter (Signed)
Summary: [facility]  [facility]   Imported By: Lanelle Bal 05/06/2009 11:34:19  _____________________________________________________________________  External Attachment:    Type:   Image     Comment:   External Document

## 2010-05-19 NOTE — Assessment & Plan Note (Signed)
Summary: discuss bp feel too low, falls asleep/alr   Vital Signs:  Patient profile:   77 year old female Height:      64 inches Weight:      155.2 pounds O2 Sat:      97 % on Room air Pulse rate:   83 / minute BP sitting:   142 / 70  Vitals Entered By: Shary Decamp (Sep 05, 2009 1:27 PM)  O2 Flow:  Room air CC: pt states that bp has been low (110-112/62-65), states that she was sitting @ table & just "fell asleep" Comments  - requesting refill on ultracet 37.5/325 1 qid (org rx'd by [employer] - pt states she does not go there) --- 90 day supply  - tramadol 50 1 every 6 hours --- 90 day supply ...Marland KitchenMarland KitchenShary Decamp  Sep 05, 2009 1:29 PM    History of Present Illness: occasionally she falls asleep when she plays cards with her friends, wonders if that is related with her blood pressure. Her ambulatory BP is usually 110-112/60  Pain management, she rarely needs any pain medication, sometimes take Tylenol. From time to time needs something stronger but she is confused about what she should take : ?ultracet   ? tramadol 50  She is confused because in the past she had 3 doctors prescribing medications for her  Current Medications (verified): 1)  Micardis 40 Mg  Tabs (Telmisartan) .Marland Kitchen.. 1 By Mouth Once Daily 2)  Cartia Xt 180 Mg Cp24 (Diltiazem Hcl Coated Beads) .... Take 1 Capsule By Mouth Once A Day 3)  Lipitor 40 Mg Tabs (Atorvastatin Calcium) .Marland Kitchen.. 1 By Mouth At Bedtime 4)  Premarin 0.3 Mg Tabs (Estrogens Conjugated) .... Take 1 Tablet By Mouth Once A Day 5)  Adult Aspirin Low Strength 81 Mg  Tbdp (Aspirin) .Marland Kitchen.. 1 Tab Once Daily 6)  Lidoderm 5 %  Ptch (Lidocaine) .Marland Kitchen.. 1 Patch As Directed (No More Than 3 Daily) - Do Not Use For More Than 12 Hours (For Knee Pain) 7)  Neurontin 100 Mg Caps (Gabapentin) .Marland Kitchen.. 1-2 By Mouth Three Times A Day and 3 At Bedtime As Needed 8)  Miralax  Powd (Polyethylene Glycol 3350) .... Per Pt Not Taking 9)  Citrucel 500 Mg  Tabs (Methylcellulose (Laxative))  .... As Needed Daily 10)  Calcium Carbonate-Vitamin D 600-400 Mg-Unit  Tabs (Calcium Carbonate-Vitamin D) .Marland Kitchen.. 1 Tab By Mouth  Every Morning and 2 By Mouth Every Evening 11)  One-Daily Multivitamins   Tabs (Multiple Vitamin) .... Take 1 Tablet By Mouth Once A Day 12)  Vitamin C 500 Mg Tabs (Ascorbic Acid) .... Take 1/2 Tablet By Mouth Once A Day 13)  Ginkgo Biloba 120 Mg Tabs (Ginkgo Biloba) .... Take 1 Tablet By Mouth Once A Day 14)  Zyrtec Allergy 10 Mg Tabs (Cetirizine Hcl) .... Take 1 Tablet By Mouth Once A Day As Needed 15)  Anusol-Hc 25 Mg Supp (Hydrocortisone Acetate) .... Take One Suppository Q.h.s.  Allergies (verified): 1)  ! Codeine 2)  ! Nortriptyline Hcl 3)  ! Amitriptyline Hcl 4)  ! Carisoprodol 5)  ! * Bextra 6)  ! Daypro 7)  ! Imitrex 8)  ! Mobic 9)  ! Vioxx 10)  ! Phenergan 11)  ! Metronidazole  Past History:  Past Medical History: HYPERLIPIDEMIA  HTN ANXIETY and DEPRESSION  PULMONARY NODULES.......................................................................Marland KitchenWert     - First seen by CT only 05/04/03, no change on f/u 08/08/06     - Macroscopic changes rml 04/27/07 > resolved July 23, 2008      - no further w/u  KNEE PAIN, RIGHT, CHRONIC  h/o SVT--sees Dr Clarene Reamer PAIN, LUMBAR, CHRONIC  H/O HYDRONEPHROSIS, RIGHT 2008 s/p urology eval/CT/cysto: observation/monitor Headache GI-----Hx of colon polyps 2004, Diverticulosis, Internal Hemorrhoids  Past Surgical History: Reviewed history from 09/02/2009 and no changes required. Hysterectomy TKR--R Cataract extraction 2008 Dr Hazle Quant exploratory lap 2005, R ovary removed Lumbar reconstructive surgery 01/07/2009 Appendectomy Cholecystectomy  Social History: Reviewed history from 06/10/2009 and no changes required. widow 1 son moved to a retirement home (05-2008) [place] , has 3 meals a day, has a cleaning service  Patient never smoked  ETOH-- not in 2 years  Review of Systems       denies  dizziness when she stands up No recent change on her BP meds the patient is eating and drinking fluids normally, she is actually eating healthier lately. She sleeps well no anxiety or depression  Physical Exam  General:  alert and well-developed.   Lungs:  normal respiratory effort, no intercostal retractions, no accessory muscle use, and normal breath sounds.   Heart:  normal rate, regular rhythm, no murmur, and no gallop.   Psych:  not anxious appearing and not depressed appearing.     Impression & Recommendations:  Problem # 1:  KNEE PAIN, RIGHT, CHRONIC (ICD-719.46) she rarely needs any pain medication, sometimes take Tylenol. From time to time needs something stronger but she is confused about what she should take : ?ultracet   ? tramadol 50  She is confused because in the past she had 3 doctors prescribing medications for her Plan: tylenol, see instructions if pain is sevre she will stick to tramadol for now, has several tablets left over, no prescription given today but she will call if she needs more. knows  not to get the medication from anybody else, pain management contract signed Her updated medication list for this problem includes:    Adult Aspirin Low Strength 81 Mg Tbdp (Aspirin) .Marland Kitchen... 1 tab once daily    Tylenol Extra Strength 500 Mg Tabs (Acetaminophen) .Marland Kitchen... As directed    Tramadol Hcl 50 Mg Tabs (Tramadol hcl) ..... One or half tablet every 6 hours as needed  Problem # 2:  HYPERTENSION (ICD-401.9) doubt her BP is over controlled, nevertheless I asked her to monitor her BP Her updated medication list for this problem includes:    Micardis 40 Mg Tabs (Telmisartan) .Marland Kitchen... 1 by mouth once daily    Cartia Xt 180 Mg Cp24 (Diltiazem hcl coated beads) .Marland Kitchen... Take 1 capsule by mouth once a day  BP today: 142/70 Prior BP: 100/62 (09/02/2009)  Labs Reviewed: K+: 4.4 (06/10/2009) Creat: : 1.0 (06/10/2009)   Chol: 173 (06/10/2009)   HDL: 79.80 (06/10/2009)   LDL: 61  (06/10/2009)   TG: 159.0 (06/10/2009)  Problem # 3:  BACK PAIN, LUMBAR, CHRONIC (ICD-724.2) see  #1 Her updated medication list for this problem includes:    Adult Aspirin Low Strength 81 Mg Tbdp (Aspirin) .Marland Kitchen... 1 tab once daily    Tylenol Extra Strength 500 Mg Tabs (Acetaminophen) .Marland Kitchen... As directed    Tramadol Hcl 50 Mg Tabs (Tramadol hcl) ..... One or half tablet every 6 hours as needed  Complete Medication List: 1)  Micardis 40 Mg Tabs (Telmisartan) .Marland Kitchen.. 1 by mouth once daily 2)  Cartia Xt 180 Mg Cp24 (Diltiazem hcl coated beads) .... Take 1 capsule by mouth once a day 3)  Lipitor 40 Mg Tabs (Atorvastatin calcium) .Marland KitchenMarland KitchenMarland Kitchen  1 by mouth at bedtime 4)  Premarin 0.3 Mg Tabs (Estrogens conjugated) .... Take 1 tablet by mouth once a day 5)  Adult Aspirin Low Strength 81 Mg Tbdp (Aspirin) .Marland Kitchen.. 1 tab once daily 6)  Lidoderm 5 % Ptch (Lidocaine) .Marland Kitchen.. 1 patch as directed (no more than 3 daily) - do not use for more than 12 hours (for knee pain) 7)  Neurontin 100 Mg Caps (Gabapentin) .Marland Kitchen.. 1-2 by mouth three times a day and 3 at bedtime as needed 8)  Miralax Powd (Polyethylene glycol 3350) .... Per pt not taking 9)  Citrucel 500 Mg Tabs (Methylcellulose (laxative)) .... As needed daily 10)  Calcium Carbonate-vitamin D 600-400 Mg-unit Tabs (Calcium carbonate-vitamin d) .Marland Kitchen.. 1 tab by mouth  every morning and 2 by mouth every evening 11)  One-daily Multivitamins Tabs (Multiple vitamin) .... Take 1 tablet by mouth once a day 12)  Vitamin C 500 Mg Tabs (Ascorbic acid) .... Take 1/2 tablet by mouth once a day 13)  Ginkgo Biloba 120 Mg Tabs (Ginkgo biloba) .... Take 1 tablet by mouth once a day 14)  Zyrtec Allergy 10 Mg Tabs (Cetirizine hcl) .... Take 1 tablet by mouth once a day as needed 15)  Anusol-hc 25 Mg Supp (Hydrocortisone acetate) .... Take one suppository q.h.s. 16)  Tylenol Extra Strength 500 Mg Tabs (Acetaminophen) .... As directed 17)  Tramadol Hcl 50 Mg Tabs (Tramadol hcl) .... One or half tablet  every 6 hours as needed  Patient Instructions: 1)  if you have mild to moderate pain, take Tylenol 500 mg one or 2 tablets every 6 hours as needed 2)  If the pain is more severe you can take tramadol 50mg  half or one tablet every 6 hours. 3)  Watch for drowsiness with the tramadol 4)  Continue checking her BP from time to time, call  if it is consistently lower than  110/60 or more than 140/85 Prescriptions: CARTIA XT 180 MG CP24 (DILTIAZEM HCL COATED BEADS) Take 1 capsule by mouth once a day  #90 x 1   Entered by:   Shary Decamp   Authorized by:   Nolon Rod. Girolamo Lortie MD   Signed by:   Shary Decamp on 09/05/2009   Method used:   Electronically to        [facility] # [phone]* (retail)       [address]       [place], [place]  [address]       Ph: [phone]       Fax: [phone]   RxID:   [id number]

## 2010-05-19 NOTE — Procedures (Signed)
Summary: Flexible Sigmoidoscopy   Flexible Sigmoidoscopy  Procedure date:  04/04/2003  Findings:      Results: Hemorrhoids.   Comments:      Location: [facility]  Patient Name: [name], [name] MRN: [id number] Procedure Procedures: Flexible Proctosigmoidoscopy CPT: [phone].    with Hemorrhoidal Banding.  Personnel: Endoscopist: Barbette Hair. Arlyce Dice, MD.  Indications  Therapeutic Intervention Reason for exam: Hemorrhoidal Banding.  History  Current Medications: Patient is not currently taking Coumadin.  Pre-Exam Physical: Performed Apr 04, 2003.  Exam Exam: Extent visualized: Sigmoid Colon. Extent of exam: 25 cm. ASA Classification: I. Tolerance: good.  Sedation Meds: Fentanyl 75 mcg. given IV. Versed 5 mg. given IV.  Findings HEMORRHOIDS: Internal. ICD9: Hemorrhoids, Internal: 455.0.  - Banding: Rectum. 5 bands were placed,  successful.   Assessment Abnormal examination, see findings above.  Diagnoses: 455.0: Hemorrhoids, Internal.   Events  Unplanned Intervention: No intervention was required.  Plans Patient Education: Patient given standard instructions for: Hemorrhoids.  Scheduling: Office Visit, to [employer]. Arlyce Dice, MD, around May 16, 2003.    This report was created from the original endoscopy report, which was reviewed and signed by the above listed endoscopist.

## 2010-05-19 NOTE — Letter (Signed)
Summary: CMN for [facility]  CMN for [facility]   Imported By: Sherian Rein 01/09/2010 15:15:07  _____________________________________________________________________  External Attachment:    Type:   Image     Comment:   External Document

## 2010-05-19 NOTE — Progress Notes (Signed)
Summary: BP elevated  Phone Note Call from Patient Call back at Home Phone [phone]   Caller: Patient Summary of Call: Pt states that she was told to call you if her BP was over 140/85. She states today it is 154/87 and she has very bad HA. Please advise if there is any change to be made. She has taken her BP med today. Army Fossa CMA  October 17, 2009 3:32 PM   Follow-up for Phone Call        continue monitoring her BP If her BP is consistently more than 140/85 or  if  more than 180/100 at any time: she is to let us know. Also to call us if the headache is intense or does not subside Destynee Stringfellow E. Delailah Spieth MD  October 17, 2009 4:27 PM   Additional Follow-up for Phone Call Additional follow up Details #1::        Spoke with pt she is aware of all instructions .Army Fossa CMA  October 17, 2009 4:29 PM

## 2010-05-19 NOTE — Letter (Signed)
Summary: doing well-----[facility]  [facility]   Imported By: Sherian Rein 12/25/2009 13:29:50  _____________________________________________________________________  External Attachment:    Type:   Image     Comment:   External Document

## 2010-05-19 NOTE — Assessment & Plan Note (Signed)
Summary: incontinence of bowels...as.    History of Present Illness Visit Type: Initial Visit Primary Provider: [name], MD Chief Complaint: Patient c/o years fecal incontinence and also c/o frequent dark black stools. She has alternating constipation and diarrhea as well as rectal discomfort.  History of Present Illness:   [name] is a pleasant 77 year old white female referred at the request of Dr. Drue Novel for evaluation of stool incontinence.  She had a similar problem 7 years ago for which she underwent band ligation.  This was successful in treating her symptoms.  She has a history of an adenomatous polyp that was  removed in 2004.  Followup colonoscopy at [facility] in January 2010 demonstrated internal hemorrhoids and diverticulosis.  She is taking fiber daily and MiraLax.  She complains of incontinence of stools requiring   a protective diaper.  She has difficulty cleaning herself and always has stool in the rectum when she urinates.  She does not have a spontaneous bowel movement.  She is without abdominal or rectal pain.  There is no history of bleeding though she claims that her stools are black.   GI Review of Systems    Reports acid reflux.      Denies abdominal pain, belching, bloating, chest pain, dysphagia with liquids, dysphagia with solids, heartburn, loss of appetite, nausea, vomiting, vomiting blood, weight loss, and  weight gain.      Reports black tarry stools, constipation, diarrhea, diverticulosis, hemorrhoids, irritable bowel syndrome, and  rectal pain.     Denies anal fissure, change in bowel habit, fecal incontinence, heme positive stool, jaundice, light color stool, liver problems, and  rectal bleeding.    Current Medications (verified): 1)  Micardis 40 Mg  Tabs (Telmisartan) .Marland Kitchen.. 1 By Mouth Once Daily 2)  Cartia Xt 180 Mg Cp24 (Diltiazem Hcl Coated Beads) .... Take 1 Capsule By Mouth Once A Day 3)  Lipitor 40 Mg Tabs (Atorvastatin Calcium) .Marland Kitchen.. 1 By Mouth At  Bedtime 4)  Premarin 0.3 Mg Tabs (Estrogens Conjugated) .... Take 1 Tablet By Mouth Once A Day 5)  Adult Aspirin Low Strength 81 Mg  Tbdp (Aspirin) .Marland Kitchen.. 1 Tab Once Daily 6)  Lidoderm 5 %  Ptch (Lidocaine) .Marland Kitchen.. 1 Patch As Directed (No More Than 3 Daily) - Do Not Use For More Than 12 Hours (For Knee Pain) 7)  Neurontin 100 Mg Caps (Gabapentin) .Marland Kitchen.. 1-2 By Mouth Three Times A Day and 3 At Bedtime As Needed 8)  Miralax  Powd (Polyethylene Glycol 3350) .Marland Kitchen.. 17grams in Liquid Once Daily 9)  Citrucel 500 Mg  Tabs (Methylcellulose (Laxative)) .... As Needed Daily 10)  Calcium Carbonate-Vitamin D 600-400 Mg-Unit  Tabs (Calcium Carbonate-Vitamin D) .Marland Kitchen.. 1 Tab By Mouth  Every Morning and 2 By Mouth Every Evening 11)  One-Daily Multivitamins   Tabs (Multiple Vitamin) .... Take 1 Tablet By Mouth Once A Day 12)  Vitamin C 500 Mg Tabs (Ascorbic Acid) .... Take 1/2 Tablet By Mouth Once A Day 13)  Ginkgo Biloba 120 Mg Tabs (Ginkgo Biloba) .... Take 1 Tablet By Mouth Once A Day 14)  Zyrtec Allergy 10 Mg Tabs (Cetirizine Hcl) .... Take 1 Tablet By Mouth Once A Day As Needed  Allergies (verified): 1)  ! Codeine 2)  ! Nortriptyline Hcl 3)  ! Amitriptyline Hcl 4)  ! Carisoprodol 5)  ! * Bextra 6)  ! Daypro 7)  ! Imitrex 8)  ! Mobic 9)  ! Vioxx 10)  ! Phenergan 11)  ! Metronidazole  Past History:  Past Medical History: Reviewed history from 08/28/2009 and no changes required. HYPERLIPIDEMIA  HTN ANXIETY and DEPRESSION  PULMONARY NODULES.......................................................................Marland KitchenWert     - First seen by CT only 05/04/03, no change on f/u 08/08/06     - Macroscopic changes rml 04/27/07 > resolved July 23, 2008      - no further w/u  KNEE PAIN, RIGHT, CHRONIC  h/o SVT--sees Dr Clarene Reamer PAIN, LUMBAR, CHRONIC  H/O HYDRONEPHROSIS, RIGHT 2008 s/p urology eval/CT/cysto: observation/monitor Headache Hx of colon polyps 2004 Diverticulosis Internal Hemorrhoids  Past Surgical  History: Hysterectomy TKR--R Cataract extraction 2008 Dr Hazle Quant exploratory lap 2005, R ovary removed Lumbar reconstructive surgery 01/07/2009 Appendectomy Cholecystectomy  Family History: colon ca--no breast ca-- mother , late in life father had lung cancer CORRECTION apparently had TB DM--M late in life bladder ca--Brother Family History of Heart Disease: Mother, Father Family History of Prostate Cancer: Father Family History of Colitis: Brother  Social History: Reviewed history from 06/10/2009 and no changes required. widow 1 son moved to a retirement home (05-2008) [place] , has 3 meals a day, has a cleaning service  Patient never smoked  ETOH-- not in 2 years  Review of Systems       The patient complains of arthritis/joint pain, back pain, skin rash, and urine leakage.  The patient denies allergy/sinus, anemia, anxiety-new, blood in urine, breast changes/lumps, change in vision, confusion, cough, coughing up blood, depression-new, fainting, fatigue, fever, headaches-new, hearing problems, heart murmur, heart rhythm changes, itching, menstrual pain, muscle pains/cramps, night sweats, nosebleeds, pregnancy symptoms, shortness of breath, sleeping problems, sore throat, swelling of feet/legs, swollen lymph glands, thirst - excessive , urination - excessive , urination changes/pain, vision changes, and voice change.         All other systems were reviewed and were negative   Vital Signs:  Patient profile:   77 year old female Height:      64 inches Weight:      154.50 pounds BMI:     26.62 BSA:     1.75 Pulse rate:   72 / minute Pulse rhythm:   regular BP sitting:   100 / 62  (left arm)  Vitals Entered By: Lamona Curl CMA Duncan Dull) (Sep 02, 2009 11:20 AM)  Physical Exam  Additional Exam:  On physical exam she is a well-developed well-nourished female  skin: anicter  HEENT: normocephalic; PEERLA; no nasal or orpharyngeal abnormalities neck:  supple nodes: no cervical adenopathy chest: clear cor:  no murmurs, gallops or rubs abd:  bowel sounds normoactive; no abdominal masses, tenderness, organomegaly rectal: no masses; stool heme negative; rectal tone is normal ext: no cyanosis, clubbing, or edema skeletal: no gross skeletal abnormalities neuro: alert, oriented x 3; no focal abnormalities    Impression & Recommendations:  Problem # 1:  FULL INCONTINENCE OF FECES (ICD-787.60) symptoms could be due to recurrent internal hemorrhoids.  At the same time, she is taking MiraLax daily which is causing loose stools.  Recommendations #1 discontinue MiraLax and continue fiber supplementation daily #2 Anusol HC suppositories #3 to consider repeat sigmoidoscopy and hemorrhoidal banding if symptoms have not improved on the above regimen  Problem # 2:  PERSONAL HISTORY OF COLONIC POLYPS (ICD-V12.72) Iinitial polypectomy 2004.  Colonoscopy 2010 negative for polyps.  No further surveillance required.  Problem # 3:  DIVERTICULOSIS OF COLON (ICD-562.10) Assessment: Comment Only  Patient Instructions: 1)  Copy sent to : [facility] Paz,MD 2)  Please schedule a follow up appointment  for 4 weeks 3)  The medication list was reviewed and reconciled.  All changed / newly prescribed medications were explained.  A complete medication list was provided to the patient / caregiver. Prescriptions: ANUSOL-HC 25 MG SUPP (HYDROCORTISONE ACETATE) take one suppository q.h.s.  #14 x 1   Entered and Authorized by:   Louis Meckel MD   Signed by:   Louis Meckel MD on 09/02/2009   Method used:   Electronically to        [facility] # [phone]* (retail)       [address]       [place], [place]  [address]       Ph: [phone]       Fax: [phone]   RxID:   [id number]

## 2010-05-19 NOTE — Assessment & Plan Note (Signed)
Summary: NP follow up - pulm nodules   Primary Provider/Referring Provider:  Drue Novel  CC:  yearly folow up for pulm nodules - states breathing is doing well overall. Marland Kitchen  History of Present Illness:  77 -year-old white female never smoker in for bilateral pulmonary nodules documented since 2005 and two left lower lobe nodules that could only be seen on CT scan dated April of 2008.    July 23, 2008 ov no new symptoms, no sob, cough,    May 13, 2009--Presents for yearly folow up for pulm nodules - states breathing is doing well overall .  She was seen last in 4/10 w/ no nodules seen on xray. Also she was in hospital during 9/10 , I reviewed this chest xray which also was neg for nodules. It has been felt since these nodules are not visible on plain film now 5 years since first detected  so most likely benign and no furhter cxr's needed. She has had no cough, hemoptysis, weight loss . Denies chest pain, dyspnea, orthopnea, hemoptysis, fever, n/v/d, edema, headache.    Medications Prior to Update: 1)  Micardis 40 Mg  Tabs (Telmisartan) .Marland Kitchen.. 1 By Mouth Once Daily 2)  Cartia Xt 180 Mg Cp24 (Diltiazem Hcl Coated Beads) .... Take 1 Capsule By Mouth Once A Day 3)  Lipitor 40 Mg Tabs (Atorvastatin Calcium) .Marland Kitchen.. 1 By Mouth At Bedtime 4)  Premarin 0.3 Mg Tabs (Estrogens Conjugated) .... Take 1 Tablet By Mouth Once A Day 5)  Adult Aspirin Low Strength 81 Mg  Tbdp (Aspirin) .Marland Kitchen.. 1 Tab Once Daily 6)  Lidoderm 5 %  Ptch (Lidocaine) .Marland Kitchen.. 1 Patch As Directed (No More Than 3 Daily) - Do Not Use For More Than 12 Hours 7)  Polyethylene Glycol 3350   Powd (Polyethylene Glycol 3350) .Marland KitchenMarland KitchenMarland Kitchen 17 Gms in Liquid Each Day 8)  Citrucel 500 Mg  Tabs (Methylcellulose (Laxative)) .... As Needed Daily 9)  Calcium 500/d   Tabs (Calcium Carbonate-Vitamin D Tabs) .Marland Kitchen.. 1 Qam 2 Qpm 10)  One-Daily Multivitamins   Tabs (Multiple Vitamin) 11)  Vitamin C 12)  Neurontin 100 Mg Caps (Gabapentin) .Marland Kitchen.. 1-2 By Mouth Three Times A Day 13)   Percocet 10-650 Mg Tabs (Oxycodone-Acetaminophen) .... One By Mouth Every 6 Hours P.r.n.  Current Medications (verified): 1)  Micardis 40 Mg  Tabs (Telmisartan) .Marland Kitchen.. 1 By Mouth Once Daily 2)  Cartia Xt 180 Mg Cp24 (Diltiazem Hcl Coated Beads) .... Take 1 Capsule By Mouth Once A Day 3)  Lipitor 40 Mg Tabs (Atorvastatin Calcium) .Marland Kitchen.. 1 By Mouth At Bedtime 4)  Premarin 0.3 Mg Tabs (Estrogens Conjugated) .... Take 1 Tablet By Mouth Once A Day 5)  Adult Aspirin Low Strength 81 Mg  Tbdp (Aspirin) .Marland Kitchen.. 1 Tab Once Daily 6)  Lidoderm 5 %  Ptch (Lidocaine) .Marland Kitchen.. 1 Patch As Directed (No More Than 3 Daily) - Do Not Use For More Than 12 Hours 7)  Miralax  Powd (Polyethylene Glycol 3350) .Marland Kitchen.. 17grams in Liquid Once Daily 8)  Citrucel 500 Mg  Tabs (Methylcellulose (Laxative)) .... As Needed Daily 9)  Calcium Carbonate-Vitamin D 600-400 Mg-Unit  Tabs (Calcium Carbonate-Vitamin D) .Marland Kitchen.. 1 Tab By Mouth  Every Morning and 2 By Mouth Every Evening 10)  One-Daily Multivitamins   Tabs (Multiple Vitamin) .... Take 1 Tablet By Mouth Once A Day 11)  Vitamin C 500 Mg Tabs (Ascorbic Acid) .... Take 1/2 Tablet By Mouth Once A Day 12)  Neurontin 100 Mg Caps (Gabapentin) .Marland Kitchen.. 1-2  By Mouth Three Times A Day 13)  Ultracet 37.5-325 Mg Tabs (Tramadol-Acetaminophen) .Marland Kitchen.. 1 Tab By Mouth Every 6-8 Hours As Needed 14)  Ginkgo Biloba 120 Mg Tabs (Ginkgo Biloba) .... Take 1 Tablet By Mouth Once A Day 15)  Zyrtec Allergy 10 Mg Tabs (Cetirizine Hcl) .... Take 1 Tablet By Mouth Once A Day As Needed 16)  Glucosamine-Chondroitin   Caps (Glucosamine-Chondroit-Vit C-Mn) .... Take 1 Capsule By Mouth Once A Day 17)  Amitriptyline Hcl 10 Mg Tabs (Amitriptyline Hcl) .... Take 1 Tablet By Mouth Once A Day  Allergies (verified): 1)  ! Codeine 2)  ! Nortriptyline Hcl 3)  ! Amitriptyline Hcl 4)  ! Carisoprodol 5)  ! * Bextra 6)  ! Daypro 7)  ! Imitrex 8)  ! Mobic 9)  ! Vioxx 10)  ! Phenergan 11)  ! Metronidazole  Past History:  Past  Medical History: Last updated: 02/11/2009 HYPERLIPIDEMIA  HTN ANXIETY and DEPRESSION  PULMONARY NODULES.......................................................................Marland KitchenWert     - First seen by CT only 05/04/03, no change on f/u 08/08/06     - Macroscopic changes rml 04/27/07 > resolved July 23, 2008  KNEE PAIN, RIGHT, CHRONIC  h/o SVT--sees Dr Clarene Reamer PAIN, LUMBAR, CHRONIC  H/O HYDRONEPHROSIS, RIGHT 2008 s/p urology eval/CT/cysto: observation/monitor Headache  Past Surgical History: Last updated: 02/11/2009 Hysterectomy TKR--R Cataract extraction 2008 Dr Hazle Quant exploratory lap 2005, R ovary removed Lumbar reconstructive surgery 01/07/2009  Family History: Last updated: 01/18/2008 colon ca--no breast ca-- mother , late in life MI--no father had lung cancer CORRECTION apparently had TB DM--M late in life bladder ca--Brother  Social History: Last updated: 07/17/2008 widow 1 son moved to a retirement home (05-2008), has 3 meals a day, has a cleaning service  Patient never smoked.   Risk Factors: Smoking Status: never (04/25/2007)  Review of Systems      See HPI  Vital Signs:  Patient profile:   77 year old female Height:      64 inches Weight:      166.25 pounds BMI:     28.64 O2 Sat:      96 % on Room air Temp:     97.4 degrees F oral Pulse rate:   72 / minute BP sitting:   126 / 74  (left arm) Cuff size:   regular  Vitals Entered By: Boone Master CNA (May 13, 2009 4:10 PM)  O2 Flow:  Room air CC: yearly folow up for pulm nodules - states breathing is doing well overall.  Is Patient Diabetic? No Comments Medications reviewed with patient Daytime contact number verified with patient. Boone Master CNA  May 13, 2009 4:09 PM    Physical Exam  Additional Exam:  Ambulatory healthy appearing in no acute distress. Afeb with normal vital signs wt  157 April 26, 2008 > 155  July 23, 2008 >>166 05/13/09 HEENT: nl dentition, turbinates, and  orophanx. Nl external ear canals without cough reflex Neck without JVD/Nodes/TM Lungs clear to A and P bilaterally without cough on insp or exp maneuvers RRR no s3 or murmur or increase in P2 no edema Abd soft and benign with nl excursion in the supine position. No bruits or organomegaly Ext warm without calf tenderness, cyanosis clubbing     Impression & Recommendations:  Problem # 1:  PULMONARY NODULE (ICD-518.89)  Not visible  on plain film now 5 years since first detected,  so most likely benign and no furhter cxr's needed.  may follow up  with pulmonary on as needed basis.   Orders: Est. Patient Level II (50093)  Medications Added to Medication List This Visit: 1)  Miralax Powd (Polyethylene glycol 3350) .Marland Kitchen.. 17grams in liquid once daily 2)  Calcium Carbonate-vitamin D 600-400 Mg-unit Tabs (Calcium carbonate-vitamin d) .Marland Kitchen.. 1 tab by mouth  every morning and 2 by mouth every evening 3)  One-daily Multivitamins Tabs (Multiple vitamin) .... Take 1 tablet by mouth once a day 4)  Vitamin C 500 Mg Tabs (Ascorbic acid) .... Take 1/2 tablet by mouth once a day 5)  Ultracet 37.5-325 Mg Tabs (Tramadol-acetaminophen) .Marland Kitchen.. 1 tab by mouth every 6-8 hours as needed 6)  Ginkgo Biloba 120 Mg Tabs (Ginkgo biloba) .... Take 1 tablet by mouth once a day 7)  Zyrtec Allergy 10 Mg Tabs (Cetirizine hcl) .... Take 1 tablet by mouth once a day as needed 8)  Glucosamine-chondroitin Caps (Glucosamine-chondroit-vit c-mn) .... Take 1 capsule by mouth once a day 9)  Amitriptyline Hcl 10 Mg Tabs (Amitriptyline hcl) .... Take 1 tablet by mouth once a day  Complete Medication List: 1)  Micardis 40 Mg Tabs (Telmisartan) .Marland Kitchen.. 1 by mouth once daily 2)  Cartia Xt 180 Mg Cp24 (Diltiazem hcl coated beads) .... Take 1 capsule by mouth once a day 3)  Lipitor 40 Mg Tabs (Atorvastatin calcium) .Marland Kitchen.. 1 by mouth at bedtime 4)  Premarin 0.3 Mg Tabs (Estrogens conjugated) .... Take 1 tablet by mouth once a day 5)  Adult  Aspirin Low Strength 81 Mg Tbdp (Aspirin) .Marland Kitchen.. 1 tab once daily 6)  Lidoderm 5 % Ptch (Lidocaine) .Marland Kitchen.. 1 patch as directed (no more than 3 daily) - do not use for more than 12 hours 7)  Miralax Powd (Polyethylene glycol 3350) .Marland Kitchen.. 17grams in liquid once daily 8)  Citrucel 500 Mg Tabs (Methylcellulose (laxative)) .... As needed daily 9)  Calcium Carbonate-vitamin D 600-400 Mg-unit Tabs (Calcium carbonate-vitamin d) .Marland Kitchen.. 1 tab by mouth  every morning and 2 by mouth every evening 10)  One-daily Multivitamins Tabs (Multiple vitamin) .... Take 1 tablet by mouth once a day 11)  Vitamin C 500 Mg Tabs (Ascorbic acid) .... Take 1/2 tablet by mouth once a day 12)  Neurontin 100 Mg Caps (Gabapentin) .Marland Kitchen.. 1-2 by mouth three times a day 13)  Ultracet 37.5-325 Mg Tabs (Tramadol-acetaminophen) .Marland Kitchen.. 1 tab by mouth every 6-8 hours as needed 14)  Ginkgo Biloba 120 Mg Tabs (Ginkgo biloba) .... Take 1 tablet by mouth once a day 15)  Zyrtec Allergy 10 Mg Tabs (Cetirizine hcl) .... Take 1 tablet by mouth once a day as needed 16)  Glucosamine-chondroitin Caps (Glucosamine-chondroit-vit c-mn) .... Take 1 capsule by mouth once a day 17)  Amitriptyline Hcl 10 Mg Tabs (Amitriptyline hcl) .... Take 1 tablet by mouth once a day  Patient Instructions: 1)  Follow up as needed basis.

## 2010-05-19 NOTE — Assessment & Plan Note (Signed)
Summary: yearly  /KDC   Vital Signs:  Patient profile:   77 year old female Height:      64 inches Weight:      160.13 pounds BMI:     27.59 Pulse rate:   70 / minute BP sitting:   120 / 62  Vitals Entered By: Kandice Hams (June 10, 2009 9:59 AM) CC: [id number]   History of Present Illness: HYPERLIPIDEMIA -- good medication compliance   HTN--  ambulatory BPs 126/65, good medication compliance   ANXIETY-- symptoms well controlled , on no meds   PULMONARY NODULES-- saw pulmonary 04-2009, cleared, no further testing   yearly-- chart reviewed   Allergies: 1)  ! Codeine 2)  ! Nortriptyline Hcl 3)  ! Amitriptyline Hcl 4)  ! Carisoprodol 5)  ! * Bextra 6)  ! Daypro 7)  ! Imitrex 8)  ! Mobic 9)  ! Vioxx 10)  ! Phenergan 11)  ! Metronidazole  Past History:  Past Medical History: HYPERLIPIDEMIA  HTN ANXIETY and DEPRESSION  PULMONARY NODULES.......................................................................Marland KitchenWert     - First seen by CT only 05/04/03, no change on f/u 08/08/06     - Macroscopic changes rml 04/27/07 > resolved July 23, 2008      - no further w/u  KNEE PAIN, RIGHT, CHRONIC  h/o SVT--sees Dr Clarene Reamer PAIN, LUMBAR, CHRONIC  H/O HYDRONEPHROSIS, RIGHT 2008 s/p urology eval/CT/cysto: observation/monitor Headache  Past Surgical History: Reviewed history from 02/11/2009 and no changes required. Hysterectomy TKR--R Cataract extraction 2008 Dr Hazle Quant exploratory lap 2005, R ovary removed Lumbar reconstructive surgery 01/07/2009  Family History: Reviewed history from 01/18/2008 and no changes required. colon ca--no breast ca-- mother , late in life MI--no father had lung cancer CORRECTION apparently had TB DM--M late in life bladder ca--Brother  Social History: widow 1 son moved to a retirement home (05-2008) [place] , has 3 meals a day, has a cleaning service  Patient never smoked  ETOH-- not in 2 years  Review of Systems General:  Denies  fever and weight loss. CV:  Denies chest pain or discomfort, palpitations, and swelling of feet. Resp:  Denies cough and shortness of breath. GI:  Denies bloody stools, nausea, and vomiting.  Physical Exam  General:  alert and well-developed.   Neck:  no masses, no thyromegaly, and normal carotid upstroke.   Lungs:  normal respiratory effort, no intercostal retractions, no accessory muscle use, and normal breath sounds.   Heart:  normal rate, regular rhythm, no murmur, and no gallop.   Abdomen:  soft, non-tender, no distention, and no masses.   Msk:  gait without difficulty some discomfort when she lies down at the table and gets up (from back pain) Extremities:  no edema in the lower extremities Psych:  not anxious appearing and not depressed appearing.     Impression & Recommendations:  Problem # 1:  PALPITATIONS (ICD-785.1) asx   Orders: TLB-TSH (Thyroid Stimulating Hormone) (01093-ATF)  Problem # 2:  HEALTH MAINTENANCE EXAM (ICD-V70.0) Td 2004 shingles shot 2007 pneumonia shot 2004 and 2009 had a flu shot    Colonoscopy: last one Dr Arlyce Dice, + polyp 11-04  colonoscopy 1 --2010 @ [place] : Hemorrhoids, diverticuli.  No polyps  sees gynecology [facility]  12-10 neg PAP per gyn   DEXA 2004 and 2009 (normal)  counseled : diet (has improved already) ,  exercise when able (had back surgery last year)    Problem # 3:  HYPERTENSION (ICD-401.9)  at goal  Her updated medication list for this problem includes:    Micardis 40 Mg Tabs (Telmisartan) .Marland Kitchen... 1 by mouth once daily    Cartia Xt 180 Mg Cp24 (Diltiazem hcl coated beads) .Marland Kitchen... Take 1 capsule by mouth once a day    BP today: 120/62 Prior BP: 126/74 (05/13/2009)  Labs Reviewed: K+: 3.9 (12/11/2008) Creat: : 1.0 (12/11/2008)   Chol: 153 (12/11/2008)   HDL: 62.20 (12/11/2008)   LDL: 71 (10/16/2007)   TG: 212.0 (12/11/2008)  Orders: TLB-BMP (Basic Metabolic Panel-BMET) (80048-METABOL) TLB-CBC Platelet -  w/Differential (85025-CBCD) Prescription Created Electronically [phone])  Problem # 4:  HYPERLIPIDEMIA (ICD-272.4)  last fasting lipid profile showed high triglycerides, she has improved her diet lately. labs Her updated medication list for this problem includes:    Lipitor 40 Mg Tabs (Atorvastatin calcium) .Marland Kitchen... 1 by mouth at bedtime  Orders: Venipuncture (60454) TLB-ALT (SGPT) (84460-ALT) TLB-AST (SGOT) (84450-SGOT) TLB-Lipid Panel (80061-LIPID)  Problem # 5:  ANXIETY (ICD-300.00) asymptomatic The following medications were removed from the medication list:    Amitriptyline Hcl 10 Mg Tabs (Amitriptyline hcl) .Marland Kitchen... Take 1 tablet by mouth once a day  Problem # 6:  BACK PAIN, LUMBAR, CHRONIC (ICD-724.2) currently well controlled  Complete Medication List: 1)  Micardis 40 Mg Tabs (Telmisartan) .Marland Kitchen.. 1 by mouth once daily 2)  Cartia Xt 180 Mg Cp24 (Diltiazem hcl coated beads) .... Take 1 capsule by mouth once a day 3)  Lipitor 40 Mg Tabs (Atorvastatin calcium) .Marland Kitchen.. 1 by mouth at bedtime 4)  Premarin 0.3 Mg Tabs (Estrogens conjugated) .... Take 1 tablet by mouth once a day 5)  Adult Aspirin Low Strength 81 Mg Tbdp (Aspirin) .Marland Kitchen.. 1 tab once daily 6)  Lidoderm 5 % Ptch (Lidocaine) .Marland Kitchen.. 1 patch as directed (no more than 3 daily) - do not use for more than 12 hours (for knee pain) 7)  Neurontin 100 Mg Caps (Gabapentin) .Marland Kitchen.. 1-2 by mouth three times a day and 3 at bedtime 8)  Miralax Powd (Polyethylene glycol 3350) .Marland Kitchen.. 17grams in liquid once daily 9)  Citrucel 500 Mg Tabs (Methylcellulose (laxative)) .... As needed daily 10)  Calcium Carbonate-vitamin D 600-400 Mg-unit Tabs (Calcium carbonate-vitamin d) .Marland Kitchen.. 1 tab by mouth  every morning and 2 by mouth every evening 11)  One-daily Multivitamins Tabs (Multiple vitamin) .... Take 1 tablet by mouth once a day 12)  Vitamin C 500 Mg Tabs (Ascorbic acid) .... Take 1/2 tablet by mouth once a day 13)  Ginkgo Biloba 120 Mg Tabs (Ginkgo biloba) ....  Take 1 tablet by mouth once a day 14)  Zyrtec Allergy 10 Mg Tabs (Cetirizine hcl) .... Take 1 tablet by mouth once a day as needed 15)  Glucosamine-chondroitin Caps (Glucosamine-chondroit-vit c-mn) .... Take 1 capsule by mouth once a day  Patient Instructions: 1)  Please schedule a follow-up appointment in 6 months .  Prescriptions: MICARDIS 40 MG  TABS (TELMISARTAN) 1 by mouth once daily  #90 x 1   Entered and Authorized by:   Elita Quick E. Jonavin Seder MD   Signed by:   Nolon Rod. Mukesh Kornegay MD on 06/10/2009   Method used:   Electronically to        [facility] # [id number]* (retail)       [address]       [place], [place]  [address]       Ph: [phone]       Fax: [phone]   RxID:   [id number]

## 2010-05-19 NOTE — Assessment & Plan Note (Signed)
Summary: constipation/[name]    History of Present Illness Visit Type: Follow-up Visit Primary GI MD: Melvia Heaps MD [facility] Primary Provider: [name], MD  Requesting Provider: na Chief Complaint: Constipation and lower abd pain  History of Present Illness:   77 YO FEMALE KNOWN TO DR. KAPLAN. SHE HAD A COLONOSCOPY IN JAN. 2010 NEGATIVE EXCEPT FOR DIVERTICULOSIS,AND HEMORRHOIDS. HX OF ADENOMATOUS POLYP 2004.   SHE HAD ROTATOR CUFF SURGERY MID SEPTEMBER, AND REQUIRED PAIN MEDS. SHE CONTINUED ON HER USUAL STOOL SOFTENER BUT BECAME CONSTIPATED. SHE CALLED AND WAS STARTED ON MIRALAX DAILY WHICH HAS NOT BEEN HELPING. SHE THEN TRIED FLEETS ENEMAS FOR 3 DAYS WITHOUT ANY RESPONSE. SHE HAS HAD SOME BUBBLING OF STOOL/LIQUID-BUT NO FORMED STOOL. SHE C/O INTERMITTENT SHARP PAINS ACROSS HER LOWER ABDOMEN,WORSE WITH MOVEMENT. NO FEVER, NO NAUSE,VOMITING. HAS BEEN EATING LESS AS SHE FEELS FULL.SHE TRIED DULCOLAX SUPP THE PAST 2 DAYS-AGAIN NO CHANGE OR STOOL.SHE FEELS IT HAS BEEN AT LEAST A WEEK SINCE A BM.   GI Review of Systems    Reports abdominal pain.     Location of  Abdominal pain: lower abdomen.    Denies acid reflux, belching, bloating, chest pain, dysphagia with liquids, dysphagia with solids, heartburn, loss of appetite, nausea, vomiting, vomiting blood, weight loss, and  weight gain.      Reports constipation.     Denies anal fissure, black tarry stools, change in bowel habit, diarrhea, diverticulosis, fecal incontinence, heme positive stool, hemorrhoids, irritable bowel syndrome, jaundice, light color stool, liver problems, rectal bleeding, and  rectal pain.    Current Medications (verified): 1)  Micardis 40 Mg  Tabs (Telmisartan) .Marland Kitchen.. 1 By Mouth Once Daily 2)  Cartia Xt 180 Mg Cp24 (Diltiazem Hcl Coated Beads) .... Take 1 Capsule By Mouth Once A Day 3)  Lipitor 40 Mg Tabs (Atorvastatin Calcium) .Marland Kitchen.. 1 By Mouth At Bedtime 4)  Premarin 0.3 Mg Tabs (Estrogens Conjugated) .... Take 1 Tablet By  Mouth Once A Day 5)  Adult Aspirin Low Strength 81 Mg  Tbdp (Aspirin) .Marland Kitchen.. 1 Tab Once Daily 6)  Lidoderm 5 %  Ptch (Lidocaine) .Marland Kitchen.. 1 Patch As Directed (No More Than 3 Daily) - Do Not Use For More Than 12 Hours (For Knee Pain) 7)  Neurontin 100 Mg Caps (Gabapentin) .Marland Kitchen.. 1-2 By Mouth Three Times A Day and 3 At Bedtime As Needed 8)  Citrucel 500 Mg  Tabs (Methylcellulose (Laxative)) .... As Needed Daily 9)  Calcium Carbonate-Vitamin D 600-400 Mg-Unit  Tabs (Calcium Carbonate-Vitamin D) .Marland Kitchen.. 1 Tab By Mouth  Every Morning and 2 By Mouth Every Evening 10)  One-Daily Multivitamins   Tabs (Multiple Vitamin) .... Take 1 Tablet By Mouth Once A Day 11)  Vitamin C 500 Mg Tabs (Ascorbic Acid) .... Take 1/2 Tablet By Mouth Once A Day 12)  Ginkgo Biloba 120 Mg Tabs (Ginkgo Biloba) .... Take 1 Tablet By Mouth Once A Day 13)  Zyrtec Allergy 10 Mg Tabs (Cetirizine Hcl) .... Take 1 Tablet By Mouth Once A Day As Needed 14)  Dulcolax 10 Mg Supp (Bisacodyl) .... As Needed 15)  Miralax  Powd (Polyethylene Glycol 3350) .... As Needed 16)  Stool Softener 250 Mg Caps (Docusate Sodium) .... As Needed 17)  Methocarbamol 500 Mg Tabs (Methocarbamol) .... One By Mouth Every Six Hours As Needed 18)  Ondansetron Hcl 4 Mg Tabs (Ondansetron Hcl) .... One Tablet By Mouth Every Six Hours As Needed 19)  Osteo Bi-Flex Adv Triple St  Tabs (Misc Natural Products) .... One  Tablet By Mouth Once Daily  Allergies (verified): 1)  ! Codeine 2)  ! Nortriptyline Hcl 3)  ! Amitriptyline Hcl 4)  ! Carisoprodol 5)  ! * Bextra 6)  ! Daypro 7)  ! Imitrex 8)  ! Mobic 9)  ! Vioxx 10)  ! Phenergan 11)  ! Metronidazole  Past History:  Past Medical History: Reviewed history from 09/05/2009 and no changes required. HYPERLIPIDEMIA  HTN ANXIETY and DEPRESSION  PULMONARY NODULES.......................................................................Marland KitchenWert     - First seen by CT only 05/04/03, no change on f/u 08/08/06     - Macroscopic  changes rml 04/27/07 > resolved July 23, 2008      - no further w/u  KNEE PAIN, RIGHT, CHRONIC  h/o SVT--sees Dr Clarene Reamer PAIN, LUMBAR, CHRONIC  H/O HYDRONEPHROSIS, RIGHT 2008 s/p urology eval/CT/cysto: observation/monitor Headache GI-----Hx of colon polyps 2004, Diverticulosis, Internal Hemorrhoids  Past Surgical History: Hysterectomy RIGHT ROTATOR CUFF 9/11 TKR--R Cataract extraction 2008 Dr Hazle Quant exploratory lap 2005, R ovary removed Lumbar reconstructive surgery 01/07/2009 Appendectomy Cholecystectomy Shoulder Surgery-Left  Tonsillectomy Bladder Repair  Family History: Reviewed history from 09/02/2009 and no changes required. colon ca--no breast ca-- mother , late in life father had lung cancer CORRECTION apparently had TB DM--M late in life bladder ca--Brother Family History of Heart Disease: Mother, Father Family History of Prostate Cancer: Father Family History of Colitis: Brother  Social History: Reviewed history from 06/10/2009 and no changes required. widow 1 son moved to a retirement home (05-2008) [place] , has 3 meals a day, has a cleaning service  Patient never smoked  ETOH-- not in 2 years  Review of Systems       The patient complains of arthritis/joint pain and back pain.  The patient denies allergy/sinus, anemia, anxiety-new, blood in urine, breast changes/lumps, change in vision, confusion, cough, coughing up blood, depression-new, fainting, fatigue, fever, headaches-new, hearing problems, heart murmur, heart rhythm changes, itching, menstrual pain, muscle pains/cramps, night sweats, nosebleeds, pregnancy symptoms, shortness of breath, skin rash, sleeping problems, sore throat, swelling of feet/legs, swollen lymph glands, thirst - excessive , urination - excessive , urination changes/pain, urine leakage, vision changes, and voice change.         SEE HPI  Vital Signs:  Patient profile:   77 year old female Height:      64 inches Weight:       153 pounds BMI:     26.36 BSA:     1.75 Temp:     98.2 degrees F oral Pulse rate:   76 / minute Pulse rhythm:   regular BP sitting:   116 / 60  (left arm) Cuff size:   regular  Vitals Entered By: Ok Anis CMA (January 21, 2010 2:07 PM)  Physical Exam  General:  Well developed, well nourished, no acute distress. Head:  Normocephalic and atraumatic. Eyes:  PERRLA, no icterus. Lungs:  Clear throughout to auscultation. Heart:  Regular rate and rhythm; no murmurs, rubs,  or bruits. Abdomen:  SOMEWHAT PROTUBERANT, BS+, NO FOCAL TENDERNESS, MILD GENERALIZED TENDERNESS, NO MASS OR HSM, NO TYMPANY Rectal:  NO IMPACTION,PASTY STOOL IN VAULT Extremities:  No clubbing, cyanosis, edema or deformities noted. Neurologic:  Alert and  oriented x4;  grossly normal neurologically. Psych:  Alert and cooperative. Normal mood and affect.   Impression & Recommendations:  Problem # 1:  ABDOMINAL DISTENTION Assessment New 77 YO FEMALE WITH  ACUTE CONSTIPATION X 2 WEEKS-ONSET POST SHOULDER SURGERY, ASSOCIATED LOWER ABDOMINAL CRAMPY PAIN,AND BLOATING/DISTENTION.  R/O HIGH IMPACTION,R/O ILEUS,R/O REFRACTORY CONSTIPATION.   PLAIN ABDOMINAL FILMS TODAY CBC,BMET TODAY INCREASE MIRALAX TO 3 DOSES DAILY UNTIL BOWELS MOVING-MAY NEED TO PURGE BOWELS IF X RAYS NEGATIVE. TYLENOL OK FOR SHOULDER PAIN,SHE IS OFF THE NARCOTIC.  Problem # 2:  CONSTIPATION (ICD-564.00) Assessment: Comment Only SEE ABOVE  Problem # 3:  PERSONAL HISTORY OF COLONIC POLYPS (ICD-V12.72) Assessment: Comment Only  Problem # 4:  DIVERTICULOSIS OF COLON (ICD-562.10) Assessment: Comment Only  Other Orders: T-Abdomen 2-view (74020TC) TLB-BMP (Basic Metabolic Panel-BMET) (80048-METABOL) TLB-CBC Platelet - w/Differential (85025-CBCD)  Patient Instructions: 1)  Get your labs drawn today in the basement and also go to the x-ray department in the basement. 2)  You can take Tylenol. 3)  Continue Miralax.  4)  Copy sent to : [name],  MD 5)  The medication list was reviewed and reconciled.  All changed / newly prescribed medications were explained.  A complete medication list was provided to the patient / caregiver.

## 2010-05-19 NOTE — Progress Notes (Signed)
Summary: constipation  ``11 Phone Note Call from Patient Call back at Work Phone [phone]   Caller: Patient Call For: Dr. Arlyce Dice Reason for Call: Talk to Nurse Summary of Call: "severely constipated from pain meds" and would like to speak w/ a nurse about it Initial call taken by: Vallarie Mare,  January 20, 2010 8:13 AM  Follow-up for Phone Call        Patient  had rotator cuff surgery on 01/05/10.  She states the pain meds she was on caused severe constipation. Has been unable to have any BM for 5 days.   She has been taking stool softeners, Miralax q HS, and the last 3 nights she has used Fleets enema.  Patient  is advised to try dulcolax supp this am and at HS until BM, she is also asked to increase her Miralax to three times a day until she has a BM then can titrate as needed.  Dr Arlyce Dice please advise if needs additional orders. Follow-up by: Darcey Nora RN, CGRN,  January 20, 2010 9:24 AM  Additional Follow-up for Phone Call Additional follow up Details #1::        ok Additional Follow-up by: Louis Meckel MD,  January 20, 2010 12:22 PM

## 2010-05-19 NOTE — Letter (Signed)
Summary: stable-- [facility]  [facility]   Imported By: Lanelle Bal 02/19/2010 15:54:07  _____________________________________________________________________  External Attachment:    Type:   Image     Comment:   External Document

## 2010-05-19 NOTE — Miscellaneous (Signed)
Summary: BONE DENSITY  Clinical Lists Changes  Orders: Added new Test order of T-Bone Densitometry (77080) - Signed Added new Test order of T-Lumbar Vertebral Assessment (77082) - Signed 

## 2010-05-19 NOTE — Assessment & Plan Note (Signed)
Summary: rash in axilla//lch   Vital Signs:  Patient profile:   77 year old female Height:      64 inches Weight:      157 pounds Temp:     98.0 degrees F oral Pulse rate:   72 / minute BP sitting:   138 / 62  (left arm)  Vitals Entered By: Jeremy Johann CMA (October 07, 2009 3:52 PM) CC:  red rash under left arm x1 1/2 week Comments REVIEWED MED LIST, PATIENT AGREED DOSE AND INSTRUCTION CORRECT    History of Present Illness: Rush for 10 days at the left axillary area Has used Polysporin and is not helping Initially was light red and now is dark red and   is increasing in size  ROS no fever No rash anywhere else No discharge or pruritus at the axila As far as the shoulder pain, she is status post evaluation with MRI, it showed a rotator cuff problem, she is waiting for a second opinion from another orthopedic doctor  Allergies: 1)  ! Codeine 2)  ! Nortriptyline Hcl 3)  ! Amitriptyline Hcl 4)  ! Carisoprodol 5)  ! * Bextra 6)  ! Daypro 7)  ! Imitrex 8)  ! Mobic 9)  ! Vioxx 10)  ! Phenergan 11)  ! Metronidazole  Past History:  Past Medical History: Reviewed history from 09/05/2009 and no changes required. HYPERLIPIDEMIA  HTN ANXIETY and DEPRESSION  PULMONARY NODULES.......................................................................Marland KitchenWert     - First seen by CT only 05/04/03, no change on f/u 08/08/06     - Macroscopic changes rml 04/27/07 > resolved July 23, 2008      - no further w/u  KNEE PAIN, RIGHT, CHRONIC  h/o SVT--sees Dr Clarene Reamer PAIN, LUMBAR, CHRONIC  H/O HYDRONEPHROSIS, RIGHT 2008 s/p urology eval/CT/cysto: observation/monitor Headache GI-----Hx of colon polyps 2004, Diverticulosis, Internal Hemorrhoids  Past Surgical History: Reviewed history from 09/02/2009 and no changes required. Hysterectomy TKR--R Cataract extraction 2008 Dr Hazle Quant exploratory lap 2005, R ovary removed Lumbar reconstructive surgery  01/07/2009 Appendectomy Cholecystectomy  Review of Systems      See HPI  Physical Exam  General:  alert and well-developed.   Neck:  no lymphadenopathies  Skin:  right axillary area normal Left axillary area: Has an irregular shape, 3x2 cm rash, deep red, slightly scaly, borders were defined   Impression & Recommendations:  Problem # 1:  DERMATITIS (ICD-692.9)  fungal dermatitis versus eczema see  prescriptions and instructions Her updated medication list for this problem includes:    Zyrtec Allergy 10 Mg Tabs (Cetirizine hcl) .Marland Kitchen... Take 1 tablet by mouth once a day as needed    Hydrocortisone 2.5 % Crea (Hydrocortisone) .Marland Kitchen... Apply twice a day for 10 days  Orders: Prescription Created Electronically [id number])  Complete Medication List: 1)  Micardis 40 Mg Tabs (Telmisartan) .Marland Kitchen.. 1 by mouth once daily 2)  Cartia Xt 180 Mg Cp24 (Diltiazem hcl coated beads) .... Take 1 capsule by mouth once a day 3)  Lipitor 40 Mg Tabs (Atorvastatin calcium) .Marland Kitchen.. 1 by mouth at bedtime 4)  Premarin 0.3 Mg Tabs (Estrogens conjugated) .... Take 1 tablet by mouth once a day 5)  Adult Aspirin Low Strength 81 Mg Tbdp (Aspirin) .Marland Kitchen.. 1 tab once daily 6)  Lidoderm 5 % Ptch (Lidocaine) .Marland Kitchen.. 1 patch as directed (no more than 3 daily) - do not use for more than 12 hours (for knee pain) 7)  Neurontin 100 Mg Caps (Gabapentin) .Marland Kitchen.. 1-2 by mouth three  times a day and 3 at bedtime as needed 8)  Miralax Powd (Polyethylene glycol 3350) .... Per pt not taking 9)  Citrucel 500 Mg Tabs (Methylcellulose (laxative)) .... As needed daily 10)  Calcium Carbonate-vitamin D 600-400 Mg-unit Tabs (Calcium carbonate-vitamin d) .Marland Kitchen.. 1 tab by mouth  every morning and 2 by mouth every evening 11)  One-daily Multivitamins Tabs (Multiple vitamin) .... Take 1 tablet by mouth once a day 12)  Vitamin C 500 Mg Tabs (Ascorbic acid) .... Take 1/2 tablet by mouth once a day 13)  Ginkgo Biloba 120 Mg Tabs (Ginkgo biloba) .... Take 1 tablet by  mouth once a day 14)  Zyrtec Allergy 10 Mg Tabs (Cetirizine hcl) .... Take 1 tablet by mouth once a day as needed 15)  Tylenol Extra Strength 500 Mg Tabs (Acetaminophen) .... As directed 16)  Hydrocortisone 2.5 % Crea (Hydrocortisone) .... Apply twice a day for 10 days 17)  Ketoconazole 2 % Crea (Ketoconazole) .... Apply twice a day for 10 days  Patient Instructions: 1)  mix  both creams 50-50 and applly them  twice a day for 10 days at the area of redness 2)  Call if not better Prescriptions: KETOCONAZOLE 2 % CREA (KETOCONAZOLE) apply twice a day for 10 days  #1 x 0   Entered and Authorized by:   Nolon Rod. Durenda Pechacek MD   Signed by:   Nolon Rod. Chrissy Ealey MD on 10/07/2009   Method used:   Electronically to        [facility] [phone]* (retail)       [address]       [place], [place]  [address]       Ph: [phone]       Fax: [phone]   RxID:   [id number] HYDROCORTISONE 2.5 % CREA (HYDROCORTISONE) apply twice a day for 10 days  #1 x 0   Entered and Authorized by:   Nolon Rod. Golden Emile MD   Signed by:   Nolon Rod. Tameem Pullara MD on 10/07/2009   Method used:   Electronically to        [facility] [address] [address] [phone]* (retail)       [address]       [place], [place]  [address]       Ph: [phone]       Fax: [phone]   RxID:   [id number]

## 2010-05-19 NOTE — Assessment & Plan Note (Signed)
Summary: 4 WEEK OR 1ST AVAIL PER ROBIN    History of Present Illness Visit Type: Follow-up Visit Primary GI MD: Melvia Heaps MD [facility] Primary Provider: [name], MD Chief Complaint: constipation, hemorrhoids, rectal pain History of Present Illness:   [name] has returned for followup of her incontinence.  After stopping her MiraLax and using his rectal suppositories her incontinence has completely subsided.  At this time she is complaining of constipation.  Constipation has been a lifelong problem.   GI Review of Systems      Denies abdominal pain, acid reflux, belching, bloating, chest pain, dysphagia with liquids, dysphagia with solids, heartburn, loss of appetite, nausea, vomiting, vomiting blood, weight loss, and  weight gain.      Reports constipation, hemorrhoids, and  rectal pain.     Denies anal fissure, black tarry stools, change in bowel habit, diarrhea, diverticulosis, fecal incontinence, heme positive stool, irritable bowel syndrome, jaundice, light color stool, liver problems, and  rectal bleeding.    Current Medications (verified): 1)  Micardis 40 Mg  Tabs (Telmisartan) .Marland Kitchen.. 1 By Mouth Once Daily 2)  Cartia Xt 180 Mg Cp24 (Diltiazem Hcl Coated Beads) .... Take 1 Capsule By Mouth Once A Day 3)  Lipitor 40 Mg Tabs (Atorvastatin Calcium) .Marland Kitchen.. 1 By Mouth At Bedtime 4)  Premarin 0.3 Mg Tabs (Estrogens Conjugated) .... Take 1 Tablet By Mouth Once A Day 5)  Adult Aspirin Low Strength 81 Mg  Tbdp (Aspirin) .Marland Kitchen.. 1 Tab Once Daily 6)  Lidoderm 5 %  Ptch (Lidocaine) .Marland Kitchen.. 1 Patch As Directed (No More Than 3 Daily) - Do Not Use For More Than 12 Hours (For Knee Pain) 7)  Neurontin 100 Mg Caps (Gabapentin) .Marland Kitchen.. 1-2 By Mouth Three Times A Day and 3 At Bedtime As Needed 8)  Citrucel 500 Mg  Tabs (Methylcellulose (Laxative)) .... As Needed Daily 9)  Calcium Carbonate-Vitamin D 600-400 Mg-Unit  Tabs (Calcium Carbonate-Vitamin D) .Marland Kitchen.. 1 Tab By Mouth  Every Morning and 2 By Mouth Every  Evening 10)  One-Daily Multivitamins   Tabs (Multiple Vitamin) .... Take 1 Tablet By Mouth Once A Day 11)  Vitamin C 500 Mg Tabs (Ascorbic Acid) .... Take 1/2 Tablet By Mouth Once A Day 12)  Ginkgo Biloba 120 Mg Tabs (Ginkgo Biloba) .... Take 1 Tablet By Mouth Once A Day 13)  Zyrtec Allergy 10 Mg Tabs (Cetirizine Hcl) .... Take 1 Tablet By Mouth Once A Day As Needed 14)  Tylenol Extra Strength 500 Mg Tabs (Acetaminophen) .... As Directed 15)  Hydrocortisone 2.5 % Crea (Hydrocortisone) .... Apply Twice A Day For 10 Days 16)  Ketoconazole 2 % Crea (Ketoconazole) .... Apply Twice A Day For 10 Days  Allergies (verified): 1)  ! Codeine 2)  ! Nortriptyline Hcl 3)  ! Amitriptyline Hcl 4)  ! Carisoprodol 5)  ! * Bextra 6)  ! Daypro 7)  ! Imitrex 8)  ! Mobic 9)  ! Vioxx 10)  ! Phenergan 11)  ! Metronidazole  Past History:  Past Medical History: Reviewed history from 09/05/2009 and no changes required. HYPERLIPIDEMIA  HTN ANXIETY and DEPRESSION  PULMONARY NODULES.......................................................................Marland KitchenWert     - First seen by CT only 05/04/03, no change on f/u 08/08/06     - Macroscopic changes rml 04/27/07 > resolved July 23, 2008      - no further w/u  KNEE PAIN, RIGHT, CHRONIC  h/o SVT--sees Dr Clarene Reamer PAIN, LUMBAR, CHRONIC  H/O HYDRONEPHROSIS, RIGHT 2008 s/p urology eval/CT/cysto:  observation/monitor Headache GI-----Hx of colon polyps 2004, Diverticulosis, Internal Hemorrhoids  Past Surgical History: Reviewed history from 09/02/2009 and no changes required. Hysterectomy TKR--R Cataract extraction 2008 Dr Hazle Quant exploratory lap 2005, R ovary removed Lumbar reconstructive surgery 01/07/2009 Appendectomy Cholecystectomy  Family History: Reviewed history from 09/02/2009 and no changes required. colon ca--no breast ca-- mother , late in life father had lung cancer CORRECTION apparently had TB DM--M late in life bladder ca--Brother Family  History of Heart Disease: Mother, Father Family History of Prostate Cancer: Father Family History of Colitis: Brother  Social History: Reviewed history from 06/10/2009 and no changes required. widow 1 son moved to a retirement home (05-2008) [place] , has 3 meals a day, has a cleaning service  Patient never smoked  ETOH-- not in 2 years  Review of Systems       The patient complains of allergy/sinus, arthritis/joint pain, back pain, and skin rash.  The patient denies anemia, anxiety-new, blood in urine, breast changes/lumps, change in vision, confusion, cough, coughing up blood, depression-new, fainting, fatigue, fever, headaches-new, hearing problems, heart murmur, heart rhythm changes, itching, menstrual pain, muscle pains/cramps, night sweats, nosebleeds, pregnancy symptoms, shortness of breath, sleeping problems, sore throat, swelling of feet/legs, swollen lymph glands, thirst - excessive , urination - excessive , urination changes/pain, urine leakage, vision changes, and voice change.    Vital Signs:  Patient profile:   77 year old female Height:      64 inches Weight:      156.50 pounds BMI:     26.96 Pulse rate:   72 / minute Pulse rhythm:   regular BP sitting:   120 / 76  (left arm) Cuff size:   regular  Vitals Entered By: June McMurray CMA Duncan Dull) (October 15, 2009 10:23 AM)   Impression & Recommendations:  Problem # 1:  FULL INCONTINENCE OF FECES (ICD-787.60) Assessment Improved  Problem # 2:  OTHER CONSTIPATION (ICD-564.09) Plan to continue fiber supplementation daily.  She was instructed to use MiraLax approximately every 3 days if she has not had a bowel movement.  Problem # 3:  PERSONAL HISTORY OF COLONIC POLYPS (ICD-V12.72)  Iinitial polypectomy 2004.  Colonoscopy 2010  negative for polyps.  No further surveillance required.  Patient Instructions: 1)  Copy sent to : [name], MD 2)  Please continue current medications.  3)  Please schedule a follow-up  appointment as needed.  4)  The medication list was reviewed and reconciled.  All changed / newly prescribed medications were explained.  A complete medication list was provided to the patient / caregiver.

## 2010-05-19 NOTE — Progress Notes (Signed)
Summary: abd pain   Phone Note Call from Patient Call back at Work Phone [phone]   Caller: Patient Call For: Dr. Arlyce Dice Reason for Call: Talk to Nurse Summary of Call: pt has called back regarding yesterday's phone call... pt says she is now having very sharp pains in her abd Initial call taken by: Vallarie Mare,  January 21, 2010 8:55 AM  Follow-up for Phone Call        Patient  attempted two dulcolax suppositories last night and took 2 doses of Miralax.  No BM.  Patient is having sharp pains in her abdomen and rectum.  Patient  is asking to be seen today.  She will come in and see Mike Gip PA today at 2:00  Follow-up by: Darcey Nora RN, CGRN,  January 21, 2010 9:08 AM

## 2010-05-19 NOTE — Procedures (Signed)
Summary: colonoscopy   Colonoscopy  Procedure date:  03/05/2003  Findings:      Results: Polyp.  Results: Hemorrhoids.     Results: Diverticulosis.       Location:  [facility].   Patient Name: [name], [name] MRN: [id number] Procedure Procedures: Colonoscopy CPT: 91478.    with Hot Biopsy(s)CPT: [id number].  Personnel: Endoscopist: Barbette Hair. Arlyce Dice, MD.  Referred By: [name], M.D.  Indications Symptoms: Abdominal pain / bloating.  History  Current Medications: Patient is not currently taking Coumadin.  Pre-Exam Physical: Performed Mar 05, 2003. Entire physical exam was normal.  Exam Exam: Extent of exam reached: Hepatic Flexure, extent intended: Cecum.  Incomplete due to inability to intubate. The cecum was identified by IC valve. Colon retroflexion performed. ASA Classification: II. Tolerance: fair, adequate exam.  Monitoring: Pulse and BP monitoring, Oximetry used. Supplemental O2 given. at 2 Liters.  Colon Prep Used Miralax for colon prep. Prep results: poor.  Sedation Meds: Fentanyl 100 mcg. given IV. Versed 10 mg. given IV. Unasyn 1.5 given IV.  Findings POLYP: Hepatic Flexure, Maximum size: 3 mm. Procedure:  hot biopsy, ICD9: Colon Polyps: 211.3.  - NOT SEEN ON EXAM: Cecum to Ascending Colon.  - DIVERTICULOSIS: Descending Colon to Sigmoid Colon. ICD9: Diverticulosis: 562.10. Comments: Diffuse diverticular changes.  HEMORRHOIDS: Internal. ICD9: Hemorrhoids, Internal: 455.0.   Assessment Abnormal examination, see findings above.  Diagnoses: 211.3: Colon Polyps.  562.10: Diverticulosis.  455.0: Hemorrhoids, Internal.   Events  Unplanned Interventions: No intervention was required.  Unplanned Events: There were no complications. Plans Medication Plan: Continue current medications. Anti-diarrheal: Lactulose 15cc QD, starting Mar 05, 2003   Patient Education: Patient given standard instructions for: Polyps.  Diverticulosis. Hemorrhoids.  Scheduling/Referral: Office Visit, to [employer]. Arlyce Dice, MD, around Apr 04, 2003.  Barium Enema, around Mar 19, 2003.    This report was created from the original endoscopy report, which was reviewed and signed by the above listed endoscopist.

## 2010-05-19 NOTE — Assessment & Plan Note (Signed)
Summary: 3 MONTH FOLLOWUP///SPH   Vital Signs:  Patient profile:   77 year old female Weight:      151.13 pounds O2 Sat:      93 % on Room air Pulse rate:   95 / minute Pulse rhythm:   regular BP sitting:   122 / 72  (left arm) Cuff size:   regular  Vitals Entered By: Army Fossa CMA (March 10, 2010 9:48 AM)  O2 Flow:  Room air CC: 3 month f/u- fasting  Comments c/o having a coughing. 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 1 By Mouth Once Daily 2)  Cartia Xt 180 Mg Cp24 (Diltiazem Hcl Coated Beads) .... Take 1 Capsule By Mouth Once A Day 3)  Lipitor 40 Mg Tabs (Atorvastatin Calcium) .Marland Kitchen.. 1 By Mouth At Bedtime 4)  Premarin 0.3 Mg Tabs (Estrogens Conjugated) .... Take 1 Tablet By Mouth Once A Day 5)  Adult Aspirin Low Strength 81 Mg  Tbdp (Aspirin) .Marland Kitchen.. 1 Tab Once Daily 6)  Lidoderm 5 %  Ptch (Lidocaine) .Marland Kitchen.. 1 Patch As Directed (No More Than 3 Daily) - Do Not Use For More Than 12 Hours (For Knee Pain) 7)  Neurontin 100 Mg Caps (Gabapentin) .Marland Kitchen.. 1-2 By Mouth Three Times A Day and 3 At Bedtime As Needed 8)  Calcium Carbonate-Vitamin D 600-400 Mg-Unit  Tabs (Calcium Carbonate-Vitamin D) .Marland Kitchen.. 1 Tab By Mouth  Every Morning and 2 By Mouth Every Evening 9)  One-Daily Multivitamins   Tabs (Multiple Vitamin) .... Take 1 Tablet By Mouth Once A Day 10)  Vitamin C 500 Mg Tabs  (Ascorbic Acid) .... Take 1/2 Tablet By Mouth Once A Day 11)  Ginkgo Biloba 120 Mg Tabs (Ginkgo Biloba) .... Take 1 Tablet By Mouth Once A Day 12)  Zyrtec Allergy 10 Mg Tabs (Cetirizine Hcl) .... Take 1 Tablet By Mouth Once A Day As Needed 13)  Stool Softener 250 Mg Caps (Docusate Sodium) .... As Needed 14)  Osteo Bi-Flex Adv Triple St  Tabs (Misc Natural Products) .... One Tablet By Mouth Once Daily  Allergies (verified): 1)  ! Codeine 2)  ! Nortriptyline Hcl 3)  ! Amitriptyline Hcl 4)  ! Carisoprodol 5)  ! * Bextra 6)  ! Daypro 7)  ! Imitrex 8)  ! Mobic 9)  ! Vioxx 10)  ! Phenergan 11)  ! Metronidazole  Past History:  Past Medical History: HYPERLIPIDEMIA  HTN ANXIETY and DEPRESSION  PULMONARY NODULES.......................................................................Marland KitchenWert     - First seen by CT only 05/04/03, no change on f/u 08/08/06     - Macroscopic changes rml 04/27/07 > resolved July 23, 2008      - no further w/u  KNEE PAIN, RIGHT, CHRONIC  BACK PAIN, LUMBAR, CHRONIC  h/o SVT--sees Dr Tenny Craw  H/O HYDRONEPHROSIS, RIGHT 2008 s/p urology eval/CT/cysto: observation/monitor Headache  GI-----Hx of colon polyps 2004, Diverticulosis, Internal Hemorrhoids  Past Surgical History: Hysterectomy RIGHT ROTATOR CUFF 9/11 TKR--R Cataract extraction 2008 Dr Hazle Quant exploratory lap 2005, R ovary removed Lumbar reconstructive surgery 01/07/2009 Appendectomy Cholecystectomy Shoulder Surgery-Left  Tonsillectomy Bladder Repair R rottator cuff 9-11  Social History: Reviewed history from 06/10/2009 and no changes required. widow 1 son moved to a retirement home (05-2008) [place] , has 3 meals a day, has a cleaning service  Patient never smoked  ETOH-- not in 2 years  Review of Systems ENT:  Complains of postnasal drainage and sore throat. Resp:  Denies chest pain with inspiration and shortness of breath. GI:  Denies diarrhea, nausea, and vomiting; BMs daily .  Physical  Exam  General:  alert and well-developed.  nontoxic Head:  face symmetric Ears:  R ear normal and L ear normal.   Nose:  mild congestion Mouth:  no redness or discharge Lungs:  normal respiratory effort, no intercostal retractions, no accessory muscle use, and normal breath sounds.  some rhonchi with cough only Heart:  normal rate, regular rhythm, no murmur, and no gallop.   Extremities:  no pretibial edema bilaterally  Skin:  left from the anal area in the buttock, she has a 1 cm boil, he is already draining a little, there is no cellulitis around it   Impression & Recommendations:  Problem # 1:  BRONCHITIS- ACUTE (ICD-466.0) Assessment New see instructions Doxycycline  Her updated medication list for this problem includes:    Vibramycin 100 Mg Caps (Doxycycline hyclate) ..... One by mouth twice a day for one week  Problem # 2:  CARBUNCLE AND FURUNCLE OF BUTTOCK (ICD-680.5) Assessment: New she has a boil in the left buttock, in a sterile fashion, we  lancet the boil,  hoping to get more discharge but  we only got some blood. It was cultured anyway. see Instructions  Problem # 3:  HYPERTENSION (ICD-401.9) at goal Her updated medication list for this problem includes:    Micardis 40 Mg Tabs (Telmisartan) .Marland Kitchen... 1 by mouth once daily    Cartia Xt 180 Mg Cp24 (Diltiazem hcl coated beads) .Marland Kitchen... Take 1 capsule by mouth once a day  BP today: 122/72 Prior BP: 110/80 (02/09/2010)  Labs Reviewed: K+: 5.0 (02/09/2010) Creat: : 1.0 (02/09/2010)   Chol: 173 (06/10/2009)   HDL: 79.80 (06/10/2009)   LDL: 61 (06/10/2009)   TG: 159.0 (06/10/2009)  Problem # 4:  HYPERLIPIDEMIA (ICD-272.4) at goal  Her updated medication list for this problem includes:    Lipitor 40 Mg Tabs (Atorvastatin calcium) .Marland Kitchen... 1 by mouth at bedtime  Labs Reviewed: SGOT: 17 (06/10/2009)   SGPT: 11 (06/10/2009)   HDL:79.80 (06/10/2009), 62.20 (12/11/2008)  LDL:61 (06/10/2009), 71 (10/16/2007)  Chol:173  (06/10/2009), 153 (12/11/2008)  Trig:159.0 (06/10/2009), 212.0 (12/11/2008)  Problem # 5:  KNEE PAIN, RIGHT, CHRONIC (ICD-719.46) we're managing her symptoms with Tylenol, neurontin   and  lidocaine patches. 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 1 tab once daily  Problem # 6:  time spent w/ pt >> 25 min due to explaining plan of care   Complete Medication List: 1)  Micardis 40 Mg Tabs (Telmisartan) .Marland Kitchen.. 1 by mouth once daily 2)  Cartia Xt 180 Mg Cp24 (Diltiazem hcl coated beads) .... Take 1 capsule by mouth once a day 3)  Lipitor 40 Mg Tabs (Atorvastatin calcium) .Marland Kitchen.. 1 by mouth at bedtime 4)  Premarin 0.3 Mg Tabs (Estrogens conjugated) .... Take 1 tablet by mouth once a day 5)  Adult Aspirin Low Strength 81 Mg Tbdp (Aspirin) .Marland Kitchen.. 1 tab once daily 6)  Lidoderm 5 % Ptch (Lidocaine) .Marland Kitchen.. 1 patch as directed (no more than 3 daily) - do not use for more than 12 hours (for knee pain) 7)  Neurontin 100 Mg Caps (Gabapentin) .Marland Kitchen.. 1-2 by mouth three times a day and 3 at bedtime as needed 8)  Calcium Carbonate-vitamin D 600-400 Mg-unit Tabs (Calcium carbonate-vitamin d) .Marland Kitchen.. 1 tab by mouth  every morning and 2 by mouth every evening 9)  One-daily Multivitamins Tabs (Multiple vitamin) .... Take 1 tablet by mouth once a day 10)  Vitamin C 500 Mg Tabs (Ascorbic acid) .... Take 1/2 tablet by mouth once a day 11)  Ginkgo Biloba 120 Mg Tabs (Ginkgo biloba) .... Take 1 tablet by mouth once a day 12)  Zyrtec Allergy 10 Mg Tabs (Cetirizine hcl) .... Take 1 tablet by mouth once a day as needed 13)  Stool Softener 250 Mg Caps (Docusate sodium) .... As needed 14)  Osteo Bi-flex Adv Triple St Tabs (Misc natural products) .... One tablet by mouth once  daily 15)  Vibramycin 100 Mg Caps (Doxycycline hyclate) .... One by mouth twice a day for one week  Other Orders: T-Culture, Wound (87070/[id number])   Patient Instructions: 1)  for  bronchitis: rest, fluids, Robitussin-DM, doxycycline ( an  antibiotic) 2)  you have a boil as well : for pain take tylenol 3)  The boil  most likely will  bleed, apply pressure with a gause  and the bleeding should subside. The antibiotic will help you fight the infection 4)  Call or go to the urgent care if: the bronchitis  symptoms get worse or if the boil gets worse ( fever, the area of infection increase  in size) 5)  Please schedule a follow-up appointment in 3 months .  Prescriptions: LIDODERM 5 %  PTCH (LIDOCAINE) 1 patch as directed (no more than 3 daily) - do not use for more than 12 hours (for knee pain)  #14month x 3   Entered and Authorized by:   Nolon Rod. Paz MD   Signed by:   Nolon Rod. Paz MD on 03/10/2010   Method used:   Print then Give to Patient   RxID:   [id number] VIBRAMYCIN 100 MG CAPS (DOXYCYCLINE HYCLATE) one by mouth twice a day for one week  #14 x 0   Entered and Authorized by:   Nolon Rod. Paz MD   Signed by:   Nolon Rod. Paz MD on 03/10/2010   Method used:   Print then Give to Patient   RxID:   [id number] PREMARIN 0.3 MG TABS (ESTROGENS CONJUGATED) Take 1 tablet by mouth once a day  #90 x 1   Entered by:   Army Fossa CMA   Authorized by:   Nolon Rod. Paz MD   Signed by:   Army Fossa CMA on 03/10/2010   Method used:   Electronically to        [facility] # [id number]* (retail)       [address]       [place], [place]  [address]       Ph: [phone]  Fax: [phone]   RxID:   [id number] CARTIA XT 180 MG CP24 (DILTIAZEM HCL COATED BEADS) Take 1 capsule by mouth once a day  #90 x 2   Entered by:   Army Fossa CMA   Authorized by:   Nolon Rod. Paz MD   Signed by:   Army Fossa CMA on 03/10/2010   Method used:   Electronically to        [facility] [address] # [id number]* (retail)       [address]       [place], [place]  [address]       Ph: [phone]       Fax: [phone]   RxID:   [id number]    Orders Added: 1)  T-Culture, Wound [[id number]] 2)  Est. Patient Level IV [[address]]

## 2010-05-19 NOTE — Letter (Signed)
Summary: [facility]  [facility]   Imported By: Lanelle Bal 06/23/2009 09:49:46  _____________________________________________________________________  External Attachment:    Type:   Image     Comment:   External Document

## 2010-05-19 NOTE — Letter (Signed)
Summary: Results Follow up Letter  [facility] at [facility]  [address] [place], [place] [address]   Phone: [phone]  Fax: [phone]    08/14/2009 MRN: [id number]  [name] [address] [place], [place]  [address]  Dear [name],  The following are the results of your recent test(s):  Test         Result     BONE DENSITY TEST       NORMAL     Please call our office if you have any questions or concerns. Alena Bills [phone]

## 2010-05-19 NOTE — Assessment & Plan Note (Signed)
Summary: DIARRHEA FOR 5 DAYS///SPH   Vital Signs:  Patient profile:   77 year old female Weight:      152 pounds BMI:     26.19 Temp:     98.5 degrees F oral Pulse rate:   74 / minute BP sitting:   110 / 80  (left arm)  Vitals Entered By: Doristine Devoid CMA (February 09, 2010 2:47 PM) CC: Diarrhea x5 days explosive and very watery along w/ stomach pains and gas, Immodium and pepto w/o improvement   History of Present Illness: 77 yo woman here today for diarrhea.  reports she had ileus after R rotator cuff surgery.  'it took weeks to get over that'.  now for last 5 days she has had diarrhea.  last night had 2 watery stools.  2 watery stools today.  last 2 days was stooling every hour.  denies fever.  having epigastric pain that improves w/ belching.  having lower abd pain, 'not as much as when i had the ileus'.  reports she has not had 'normal' sized stool since prior to surgery.  hx of IBS.  denies N/V.  some anorexia.    Current Medications (verified): 1)  Micardis 40 Mg  Tabs (Telmisartan) .Marland Kitchen.. 1 By Mouth Once Daily 2)  Cartia Xt 180 Mg Cp24 (Diltiazem Hcl Coated Beads) .... Take 1 Capsule By Mouth Once A Day 3)  Lipitor 40 Mg Tabs (Atorvastatin Calcium) .Marland Kitchen.. 1 By Mouth At Bedtime 4)  Premarin 0.3 Mg Tabs (Estrogens Conjugated) .... Take 1 Tablet By Mouth Once A Day 5)  Adult Aspirin Low Strength 81 Mg  Tbdp (Aspirin) .Marland Kitchen.. 1 Tab Once Daily 6)  Lidoderm 5 %  Ptch (Lidocaine) .Marland Kitchen.. 1 Patch As Directed (No More Than 3 Daily) - Do Not Use For More Than 12 Hours (For Knee Pain) 7)  Neurontin 100 Mg Caps (Gabapentin) .Marland Kitchen.. 1-2 By Mouth Three Times A Day and 3 At Bedtime As Needed 8)  Citrucel 500 Mg  Tabs (Methylcellulose (Laxative)) .... As Needed Daily 9)  Calcium Carbonate-Vitamin D 600-400 Mg-Unit  Tabs (Calcium Carbonate-Vitamin D) .Marland Kitchen.. 1 Tab By Mouth  Every Morning and 2 By Mouth Every Evening 10)  One-Daily Multivitamins   Tabs (Multiple Vitamin) .... Take 1 Tablet By Mouth Once A  Day 11)  Vitamin C 500 Mg Tabs (Ascorbic Acid) .... Take 1/2 Tablet By Mouth Once A Day 12)  Ginkgo Biloba 120 Mg Tabs (Ginkgo Biloba) .... Take 1 Tablet By Mouth Once A Day 13)  Zyrtec Allergy 10 Mg Tabs (Cetirizine Hcl) .... Take 1 Tablet By Mouth Once A Day As Needed 14)  Dulcolax 10 Mg Supp (Bisacodyl) .... As Needed 15)  Miralax  Powd (Polyethylene Glycol 3350) .... As Needed 16)  Stool Softener 250 Mg Caps (Docusate Sodium) .... As Needed 17)  Methocarbamol 500 Mg Tabs (Methocarbamol) .... One By Mouth Every Six Hours As Needed 18)  Ondansetron Hcl 4 Mg Tabs (Ondansetron Hcl) .... One Tablet By Mouth Every Six Hours As Needed 19)  Osteo Bi-Flex Adv Triple St  Tabs (Misc Natural Products) .... One Tablet By Mouth Once Daily  Allergies (verified): 1)  ! Codeine 2)  ! Nortriptyline Hcl 3)  ! Amitriptyline Hcl 4)  ! Carisoprodol 5)  ! * Bextra 6)  ! Daypro 7)  ! Imitrex 8)  ! Mobic 9)  ! Vioxx 10)  ! Phenergan 11)  ! Metronidazole  Review of Systems      See HPI  Physical  Exam  General:  suprisingly well appearing given complaints Mouth:  MMM Heart:  normal rate, regular rhythm, no murmur, and no gallop.   Abdomen:  soft, mildly distended, hyperactive bowel sounds, no TTP or rebound/guarding. Pulses:  +2 radial, PT   Impression & Recommendations:  Problem # 1:  DIARRHEA (ICD-787.91) Assessment New pt appears surprisingly well given her complaints.  no TTP on exam- makes diverticulitis unlikely.  check labs to r/o infxn or electrolyte abnormality.  suspicious that pt may have large stool ball that she is stooling around causing her BMs to be forceful and watery.  pt's sxs appear to be resolving as she is stooling less today than previous.  check abd xray.  will follow. Orders: Venipuncture (81191) TLB-CBC Platelet - w/Differential (85025-CBCD) TLB-BMP (Basic Metabolic Panel-BMET) (80048-METABOL) T-1 View Abdomen (KUB) (74000TC) Specimen Handling (47829)  Complete  Medication List: 1)  Micardis 40 Mg Tabs (Telmisartan) .Marland Kitchen.. 1 by mouth once daily 2)  Cartia Xt 180 Mg Cp24 (Diltiazem hcl coated beads) .... Take 1 capsule by mouth once a day 3)  Lipitor 40 Mg Tabs (Atorvastatin calcium) .Marland Kitchen.. 1 by mouth at bedtime 4)  Premarin 0.3 Mg Tabs (Estrogens conjugated) .... Take 1 tablet by mouth once a day 5)  Adult Aspirin Low Strength 81 Mg Tbdp (Aspirin) .Marland Kitchen.. 1 tab once daily 6)  Lidoderm 5 % Ptch (Lidocaine) .Marland Kitchen.. 1 patch as directed (no more than 3 daily) - do not use for more than 12 hours (for knee pain) 7)  Neurontin 100 Mg Caps (Gabapentin) .Marland Kitchen.. 1-2 by mouth three times a day and 3 at bedtime as needed 8)  Citrucel 500 Mg Tabs (Methylcellulose (laxative)) .... As needed daily 9)  Calcium Carbonate-vitamin D 600-400 Mg-unit Tabs (Calcium carbonate-vitamin d) .Marland Kitchen.. 1 tab by mouth  every morning and 2 by mouth every evening 10)  One-daily Multivitamins Tabs (Multiple vitamin) .... Take 1 tablet by mouth once a day 11)  Vitamin C 500 Mg Tabs (Ascorbic acid) .... Take 1/2 tablet by mouth once a day 12)  Ginkgo Biloba 120 Mg Tabs (Ginkgo biloba) .... Take 1 tablet by mouth once a day 13)  Zyrtec Allergy 10 Mg Tabs (Cetirizine hcl) .... Take 1 tablet by mouth once a day as needed 14)  Dulcolax 10 Mg Supp (Bisacodyl) .... As needed 15)  Miralax Powd (Polyethylene glycol 3350) .... As needed 16)  Stool Softener 250 Mg Caps (Docusate sodium) .... As needed 17)  Methocarbamol 500 Mg Tabs (Methocarbamol) .... One by mouth every six hours as needed 18)  Ondansetron Hcl 4 Mg Tabs (Ondansetron hcl) .... One tablet by mouth every six hours as needed 19)  Osteo Bi-flex Adv Triple St Tabs (Misc natural products) .... One tablet by mouth once daily  Patient Instructions: 1)  Follow up as needed 2)  Go to [address] to get your xray 3)  I'll call you with your xray and lab results 4)  Make sure you are drinking plenty of fluids 5)  The good news is that this appears to  be slowing down on it's own 6)  Use the immodium sparingly b/c you don't want to get constipated again 7)  Please eat as you are able 8)  Hang in there!!   Orders Added: 1)  Venipuncture [56213] 2)  TLB-CBC Platelet - w/Differential [85025-CBCD] 3)  TLB-BMP (Basic Metabolic Panel-BMET) [80048-METABOL] 4)  T-1 View Abdomen (KUB) [74000TC] 5)  Specimen Handling [99000] 6)  Est. Patient Level III [08657]

## 2010-05-19 NOTE — Letter (Signed)
Summary: [facility]: Immunoassay Fecal Occult Blood (iFOB) Order [facility]  [address] [place], [place] [address]   Phone: [phone]  Fax: [phone]      [facility]: Immunoassay Fecal Occult Blood (iFOB) Order Form   Sep 02, 2009 MRN: [id number]   Zyrah Wynder Jun 22, 1929   Physicican Name:Crislyn Willbanks  Diagnosis Code: BLOOD IN STOOLS     Merri Ray CMA (AAMA)

## 2010-05-19 NOTE — Progress Notes (Signed)
Summary: work-in request   Phone Note Call from Patient Call back at Work Phone [phone]   Caller: Patient Call For: Dr. Arlyce Dice Reason for Call: Talk to Nurse Summary of Call: pt reporting watery diarrhea for several days and she is supposed to go out of town tomorrow... will not wait to see Dr. Arlyce Dice... wants to see Amy today Initial call taken by: Vallarie Mare,  February 09, 2010 8:11 AM  Follow-up for Phone Call        Patient c/o several day so fdiarrhea.  She is leaving town in the am and wants to be seen today by Mike Gip PA.  I have advised the patient that there are no openings here today.  Amy Esterwood PA is not in the office this week.  Patient states that she will try her primary care she thanked me and hung up.   Follow-up by: Darcey Nora RN, CGRN,  February 09, 2010 8:24 AM

## 2010-05-19 NOTE — Progress Notes (Signed)
Summary: Inflammation on Vaginia  Phone Note Call from Patient Call back at Home Phone [phone]   Caller: Patient Summary of Call: Pt states that today is the last day of her ATB. She is having irriatition on the outside of vagina. She would like to know if there something she can take it for it. She also is still having the the chest congestion. She also states that she is having (L) Kidney pain. Pt would like to come in today. Marisue Ivan spoke with pt and is making her an appt to be seen with one of the physcians here.  Initial call taken by: Army Fossa CMA,  March 16, 2010 10:45 AM  Follow-up for Phone Call        Patient is coming to see Dr. Laury Axon @ 11:45. Follow-up by: Harold Barban,  March 16, 2010 11:36 AM

## 2010-05-19 NOTE — Progress Notes (Signed)
Summary: appt for  clearence [id number]  Phone Note Outgoing Call   Summary of Call: [place] [facility] request clearance for a right shoulder rotator cuff repair due for a ROV, please arrange for this week Jose E. Paz MD  November 25, 2009 9:13 AM   Follow-up for Phone Call        lmom ro schedule appt for clearance .Marland KitchenOkey Regal [place]  November 26, 2009 11:24 AM   patient returned call - patient scheduled appt [id number] .Marland KitchenOkey Regal [place]  November 28, 2009 11:10 AM

## 2010-05-19 NOTE — Letter (Signed)
Summary: [facility]  [facility]   Imported By: Lanelle Bal 06/26/2009 09:38:27  _____________________________________________________________________  External Attachment:    Type:   Image     Comment:   External Document

## 2010-05-19 NOTE — Letter (Signed)
Summary: [facility]  [facility]   Imported By: Lanelle Bal 10/28/2009 13:47:50  _____________________________________________________________________  External Attachment:    Type:   Image     Comment:   External Document

## 2010-05-19 NOTE — Assessment & Plan Note (Signed)
Summary: kidney,inflammed around vaginia//lch   Vital Signs:  Patient profile:   77 year old female Weight:      152.2 pounds Temp:     98.1 degrees F oral Pulse rate:   88 / minute Pulse rhythm:   regular BP sitting:   122 / 68  (right arm) Cuff size:   regular  Vitals Entered By: Almeta Monas CMA Duncan Dull) (March 16, 2010 11:52 AM) CC: x 1 day c/o left kidney pain and irritation to her vagina-no discharge   History of Present Illness: Pt here c/o vaginal itching since being on doxy and she still has bronchitis ---see last visit.   Pt also c/o pain L flank.   No dysuria or frequency.   No vaginal d/c.    Current Medications (verified): 1)  Micardis 40 Mg  Tabs (Telmisartan) .Marland Kitchen.. 1 By Mouth Once Daily 2)  Cartia Xt 180 Mg Cp24 (Diltiazem Hcl Coated Beads) .... Take 1 Capsule By Mouth Once A Day 3)  Lipitor 40 Mg Tabs (Atorvastatin Calcium) .Marland Kitchen.. 1 By Mouth At Bedtime 4)  Premarin 0.3 Mg Tabs (Estrogens Conjugated) .... Take 1 Tablet By Mouth Once A Day 5)  Adult Aspirin Low Strength 81 Mg  Tbdp (Aspirin) .Marland Kitchen.. 1 Tab Once Daily 6)  Lidoderm 5 %  Ptch (Lidocaine) .Marland Kitchen.. 1 Patch As Directed (No More Than 3 Daily) - Do Not Use For More Than 12 Hours (For Knee Pain) 7)  Neurontin 100 Mg Caps (Gabapentin) .Marland Kitchen.. 1-2 By Mouth Three Times A Day and 3 At Bedtime As Needed 8)  Calcium Carbonate-Vitamin D 600-400 Mg-Unit  Tabs (Calcium Carbonate-Vitamin D) .Marland Kitchen.. 1 Tab By Mouth  Every Morning and 2 By Mouth Every Evening 9)  One-Daily Multivitamins   Tabs (Multiple Vitamin) .... Take 1 Tablet By Mouth Once A Day 10)  Vitamin C 500 Mg Tabs (Ascorbic Acid) .... Take 1/2 Tablet By Mouth Once A Day 11)  Ginkgo Biloba 120 Mg Tabs (Ginkgo Biloba) .... Take 1 Tablet By Mouth Once A Day 12)  Zyrtec Allergy 10 Mg Tabs (Cetirizine Hcl) .... Take 1 Tablet By Mouth Once A Day As Needed 13)  Stool Softener 250 Mg Caps (Docusate Sodium) .... As Needed 14)  Osteo Bi-Flex Adv Triple St  Tabs (Misc Natural Products)  .... One Tablet By Mouth Once Daily 15)  Vibramycin 100 Mg Caps (Doxycycline Hyclate) .... One By Mouth Twice A Day For One Week 16)  Levaquin 500 Mg Tabs (Levofloxacin) .Marland Kitchen.. 1 By Mouth Once Daily 17)  Fluconazole 150 Mg Tabs (Fluconazole) .Marland Kitchen.. 1 By Mouth Once Daily X1 ,  May Repeat in 3 Days As Needed  Allergies (verified): 1)  ! Codeine 2)  ! Nortriptyline Hcl 3)  ! Amitriptyline Hcl 4)  ! Carisoprodol 5)  ! * Bextra 6)  ! Daypro 7)  ! Imitrex 8)  ! Mobic 9)  ! Vioxx 10)  ! Phenergan 11)  ! Metronidazole  Past History:  Past Medical History: Last updated: 03/10/2010 HYPERLIPIDEMIA  HTN ANXIETY and DEPRESSION  PULMONARY NODULES.......................................................................Marland KitchenWert     - First seen by CT only 05/04/03, no change on f/u 08/08/06     - Macroscopic changes rml 04/27/07 > resolved July 23, 2008      - no further w/u  KNEE PAIN, RIGHT, CHRONIC  BACK PAIN, LUMBAR, CHRONIC  h/o SVT--sees Dr Tenny Craw  H/O HYDRONEPHROSIS, RIGHT 2008 s/p urology eval/CT/cysto: observation/monitor Headache GI-----Hx of colon polyps 2004, Diverticulosis, Internal Hemorrhoids  Past Surgical History:  Last updated: 03/10/2010 Hysterectomy RIGHT ROTATOR CUFF 9/11 TKR--R Cataract extraction 2008 Dr Hazle Quant exploratory lap 2005, R ovary removed Lumbar reconstructive surgery 01/07/2009 Appendectomy Cholecystectomy Shoulder Surgery-Left  Tonsillectomy Bladder Repair R rottator cuff 9-11  Family History: Last updated: 09/02/2009 colon ca--no breast ca-- mother , late in life father had lung cancer CORRECTION apparently had TB DM--M late in life bladder ca--Brother Family History of Heart Disease: Mother, Father Family History of Prostate Cancer: Father Family History of Colitis: Brother  Social History: Last updated: 06/10/2009 widow 1 son moved to a retirement home (05-2008) [place] , has 3 meals a day, has a cleaning service  Patient never smoked    ETOH-- not in 2 years  Risk Factors: Smoking Status: never (04/25/2007)  Family History: Reviewed history from 09/02/2009 and no changes required. colon ca--no breast ca-- mother , late in life father had lung cancer CORRECTION apparently had TB DM--M late in life bladder ca--Brother Family History of Heart Disease: Mother, Father Family History of Prostate Cancer: Father Family History of Colitis: Brother  Social History: Reviewed history from 06/10/2009 and no changes required. widow 1 son moved to a retirement home (05-2008) [place] , has 3 meals a day, has a cleaning service  Patient never smoked  ETOH-- not in 2 years  Review of Systems      See HPI  Physical Exam  General:  Well-developed,well-nourished,in no acute distress; alert,appropriate and cooperative throughout examination Neck:  supple and full ROM.   Lungs:  R decreased breath sounds and L decreased breath sounds.   Heart:  normal rate.   Abdomen:  Bowel sounds positive,abdomen soft and non-tender without masses, organomegaly or hernias noted. Genitalia:  + ext irritation + white d/c Psych:  Oriented X3 and normally interactive.     Impression & Recommendations:  Problem # 1:  UTI (ICD-599.0)  The following medications were removed from the medication list:    Vibramycin 100 Mg Caps (Doxycycline hyclate) ..... One by mouth twice a day for one week Her updated medication list for this problem includes:    Levaquin 500 Mg Tabs (Levofloxacin) .Marland Kitchen... 1 by mouth once daily  Orders: T-Culture, Urine ([id number]) UA Dipstick w/o Micro (manual) (59563)  Encouraged to push clear liquids, get enough rest, and take acetaminophen as needed. To be seen in 10 days if no improvement, sooner if worse.  Problem # 2:  CANDIDIASIS, VAGINAL (ICD-112.1)  Her updated medication list for this problem includes:    Fluconazole 150 Mg Tabs (Fluconazole) .Marland Kitchen... 1 by mouth once daily x1 ,  may repeat in 3 days as  needed  Discussed treatment regimen and preventive measures.   Orders: UA Dipstick w/o Micro (manual) (87564)  Problem # 3:  BRONCHITIS- ACUTE (ICD-466.0) Assessment: Improved  The following medications were removed from the medication list:    Vibramycin 100 Mg Caps (Doxycycline hyclate) ..... One by mouth twice a day for one week Her updated medication list for this problem includes:    Levaquin 500 Mg Tabs (Levofloxacin) .Marland Kitchen... 1 by mouth once daily  Take antibiotics and other medications as directed. Encouraged to push clear liquids, get enough rest, and take acetaminophen as needed. To be seen in 5-7 days if no improvement, sooner if worse.  Orders: UA Dipstick w/o Micro (manual) (33295)  Problem # 4:  CARBUNCLE AND FURUNCLE OF BUTTOCK (ICD-680.5) Assessment: Improved  Orders: UA Dipstick w/o Micro (manual) (18841)  Complete Medication List: 1)  Micardis 40 Mg Tabs (Telmisartan) .Marland KitchenMarland KitchenMarland Kitchen  1 by mouth once daily 2)  Cartia Xt 180 Mg Cp24 (Diltiazem hcl coated beads) .... Take 1 capsule by mouth once a day 3)  Lipitor 40 Mg Tabs (Atorvastatin calcium) .Marland Kitchen.. 1 by mouth at bedtime 4)  Premarin 0.3 Mg Tabs (Estrogens conjugated) .... Take 1 tablet by mouth once a day 5)  Adult Aspirin Low Strength 81 Mg Tbdp (Aspirin) .Marland Kitchen.. 1 tab once daily 6)  Lidoderm 5 % Ptch (Lidocaine) .Marland Kitchen.. 1 patch as directed (no more than 3 daily) - do not use for more than 12 hours (for knee pain) 7)  Neurontin 100 Mg Caps (Gabapentin) .Marland Kitchen.. 1-2 by mouth three times a day and 3 at bedtime as needed 8)  Calcium Carbonate-vitamin D 600-400 Mg-unit Tabs (Calcium carbonate-vitamin d) .Marland Kitchen.. 1 tab by mouth  every morning and 2 by mouth every evening 9)  One-daily Multivitamins Tabs (Multiple vitamin) .... Take 1 tablet by mouth once a day 10)  Vitamin C 500 Mg Tabs (Ascorbic acid) .... Take 1/2 tablet by mouth once a day 11)  Ginkgo Biloba 120 Mg Tabs (Ginkgo biloba) .... Take 1 tablet by mouth once a day 12)  Zyrtec  Allergy 10 Mg Tabs (Cetirizine hcl) .... Take 1 tablet by mouth once a day as needed 13)  Stool Softener 250 Mg Caps (Docusate sodium) .... As needed 14)  Osteo Bi-flex Adv Triple St Tabs (Misc natural products) .... One tablet by mouth once daily 15)  Levaquin 500 Mg Tabs (Levofloxacin) .Marland Kitchen.. 1 by mouth once daily 16)  Fluconazole 150 Mg Tabs (Fluconazole) .Marland Kitchen.. 1 by mouth once daily x1 ,  may repeat in 3 days as needed Prescriptions: FLUCONAZOLE 150 MG TABS (FLUCONAZOLE) 1 by mouth once daily x1 ,  may repeat in 3 days as needed  #2 x 0   Entered and Authorized by:   Loreen Freud DO   Signed by:   Loreen Freud DO on 03/16/2010   Method used:   Electronically to        [facility] [address] [facility] [phone]* (retail)       [address]       [place], [place]  [address]       Ph: [phone]       Fax: [phone]   RxID:   [id number] LEVAQUIN 500 MG TABS (LEVOFLOXACIN) 1 by mouth once daily  #10 x 0   Entered and Authorized by:   Loreen Freud DO   Signed by:   Loreen Freud DO on 03/16/2010   Method used:   Electronically to        [facility] [address] [phone]* (retail)       [address]       [place], [place]  [address]       Ph: [phone]       Fax: [phone]   RxID:   [id number]    Orders Added: 1)  T-Culture, Urine [[id number]] 2)  Est. Patient Level III [69678] 3)  UA Dipstick w/o Micro (manual) [81002]    Laboratory Results   Urine Tests    Routine Urinalysis   Color: yellow Appearance: Cloudy Glucose: negative   (Normal Range: Negative) Bilirubin: negative   (Normal Range: Negative) Ketone: negative   (Normal Range: Negative) Spec. Gravity: 1.010   (Normal Range: 1.003-1.035) Blood: large   (Normal Range: Negative) pH: 5.0   (Normal Range: 5.0-8.0) Protein: 30   (Normal Range: Negative) Urobilinogen: 0.2   (Normal Range: 0-1) Nitrite: negative   (Normal Range:  Negative) Leukocyte Esterace: large   (Normal Range: Negative)

## 2010-05-19 NOTE — Letter (Signed)
Summary: New Patient letter  [facility]  [address] [place], [place] [address]   Phone: [phone]  Fax: [phone]       08/05/2009 MRN: [id number]  [name] [address] [place], [place]  [address]  Dear [name],  Welcome to the Gastroenterology Division at [facility].    You are scheduled to see Dr. Arlyce Dice on 09/02/2009 at 10:45AM on the 3rd floor at [facility], [address]  We ask that you try to arrive at our office 15 minutes prior to your appointment time to allow for check-in.  We would like you to complete the enclosed self-administered evaluation form prior to your visit and bring it with you on the day of your appointment.  We will review it with you.  Also, please bring a complete list of all your medications or, if you prefer, bring the medication bottles and we will list them.  Please bring your insurance card so that we may make a copy of it.  If your insurance requires a referral to see a specialist, please bring your referral form from your primary care physician.  Co-payments are due at the time of your visit and may be paid by cash, check or credit card.     Your office visit will consist of a consult with your physician (includes a physical exam), any laboratory testing he/she may order, scheduling of any necessary diagnostic testing (e.g. x-ray, ultrasound, CT-scan), and scheduling of a procedure (e.g. Endoscopy, Colonoscopy) if required.  Please allow enough time on your schedule to allow for any/all of these possibilities.    If you cannot keep your appointment, please call [phone] to cancel or reschedule prior to your appointment date.  This allows [place] the opportunity to schedule an appointment for another patient in need of care.  If you do not cancel or reschedule by 5 p.m. the business day prior to your appointment date, you will be charged a $50.00 late cancellation/no-show fee.    Thank you for  choosing [facility] for your medical needs.  We appreciate the opportunity to care for you.  Please visit [place] at our website  to learn more about our practice.                     Sincerely,                                                             The Gastroenterology Division

## 2010-05-19 NOTE — Assessment & Plan Note (Signed)
Summary: surgical clearance//lch   Vital Signs:  Patient profile:   77 year old female Weight:      153 pounds Pulse rate:   63 / minute Pulse rhythm:   regular BP sitting:   124 / 86  (left arm) Cuff size:   regular  Vitals Entered By: Army Fossa CMA (December 04, 2009 10:18 AM) CC: Surgical Clearance (Rotator Cuff) Comments Having pain in left hip when she is getting up and down.   History of Present Illness: here for pre-operative clearance to have  right shoulder surgery with Dr.Norrins--->  rotator cuff repair Overall feels well as far as her heart history, at some point she had  palpitation and a mitral valve prolapse, she used to see cardiology.   Current Medications (verified): 1)  Micardis 40 Mg  Tabs (Telmisartan) .Marland Kitchen.. 1 By Mouth Once Daily 2)  Cartia Xt 180 Mg Cp24 (Diltiazem Hcl Coated Beads) .... Take 1 Capsule By Mouth Once A Day 3)  Lipitor 40 Mg Tabs (Atorvastatin Calcium) .Marland Kitchen.. 1 By Mouth At Bedtime 4)  Premarin 0.3 Mg Tabs (Estrogens Conjugated) .... Take 1 Tablet By Mouth Once A Day 5)  Adult Aspirin Low Strength 81 Mg  Tbdp (Aspirin) .Marland Kitchen.. 1 Tab Once Daily 6)  Lidoderm 5 %  Ptch (Lidocaine) .Marland Kitchen.. 1 Patch As Directed (No More Than 3 Daily) - Do Not Use For More Than 12 Hours (For Knee Pain) 7)  Neurontin 100 Mg Caps (Gabapentin) .Marland Kitchen.. 1-2 By Mouth Three Times A Day and 3 At Bedtime As Needed 8)  Citrucel 500 Mg  Tabs (Methylcellulose (Laxative)) .... As Needed Daily 9)  Calcium Carbonate-Vitamin D 600-400 Mg-Unit  Tabs (Calcium Carbonate-Vitamin D) .Marland Kitchen.. 1 Tab By Mouth  Every Morning and 2 By Mouth Every Evening 10)  One-Daily Multivitamins   Tabs (Multiple Vitamin) .... Take 1 Tablet By Mouth Once A Day 11)  Vitamin C 500 Mg Tabs (Ascorbic Acid) .... Take 1/2 Tablet By Mouth Once A Day 12)  Ginkgo Biloba 120 Mg Tabs (Ginkgo Biloba) .... Take 1 Tablet By Mouth Once A Day 13)  Zyrtec Allergy 10 Mg Tabs (Cetirizine Hcl) .... Take 1 Tablet By Mouth Once A Day As  Needed 14)  Hydrocortisone 2.5 % Crea (Hydrocortisone) .... Apply Twice A Day For 10 Days 15)  Ketoconazole 2 % Crea (Ketoconazole) .... Apply Twice A Day For 10 Days 16)  Advil 200 Mg Caps (Ibuprofen) .... As Needed  Allergies: 1)  ! Codeine 2)  ! Nortriptyline Hcl 3)  ! Amitriptyline Hcl 4)  ! Carisoprodol 5)  ! * Bextra 6)  ! Daypro 7)  ! Imitrex 8)  ! Mobic 9)  ! Vioxx 10)  ! Phenergan 11)  ! Metronidazole  Past History:  Past Medical History: Reviewed history from 09/05/2009 and no changes required. HYPERLIPIDEMIA  HTN ANXIETY and DEPRESSION  PULMONARY NODULES.......................................................................Marland KitchenWert     - First seen by CT only 05/04/03, no change on f/u 08/08/06     - Macroscopic changes rml 04/27/07 > resolved July 23, 2008      - no further w/u  KNEE PAIN, RIGHT, CHRONIC  h/o SVT--sees Dr Clarene Reamer PAIN, LUMBAR, CHRONIC  H/O HYDRONEPHROSIS, RIGHT 2008 s/p urology eval/CT/cysto: observation/monitor Headache GI-----Hx of colon polyps 2004, Diverticulosis, Internal Hemorrhoids  Past Surgical History: Reviewed history from 09/02/2009 and no changes required. Hysterectomy TKR--R Cataract extraction 2008 Dr Hazle Quant exploratory lap 2005, R ovary removed Lumbar reconstructive surgery 01/07/2009 Appendectomy Cholecystectomy  Social History:  Reviewed history from 06/10/2009 and no changes required. widow 1 son moved to a retirement home (05-2008) [place] , has 3 meals a day, has a cleaning service  Patient never smoked  ETOH-- not in 2 years  Review of Systems General:  in general she is active, does not have a routine exercise program She is able to walk a long hallway at the place she lives several times a day without getting chest pain or dyspnea on exertion. She does not go upstairs mostly due to knee problems. CV:  Denies chest pain or discomfort, palpitations, and swelling of feet; no orthopnea. GI:  Denies abdominal  pain, bloody stools, nausea, and vomiting.  Physical Exam  General:  alert, well-developed, and well-nourished.   Neck:  no thyromegaly and normal carotid upstroke.   Lungs:  normal respiratory effort, no intercostal retractions, no accessory muscle use, and normal breath sounds.   Heart:  normal rate, regular rhythm, no murmur, and no gallop.   Abdomen:  soft, non-tender, no distention, and no masses.   Extremities:  no pretibial edema bilaterally  Psych:  Cognition and judgment appear intact. Alert and cooperative with normal attention span and concentration , not anxious appearing and not depressed appearing.     Impression & Recommendations:  Problem # 1:  PREOPERATIVE EXAMINATION (ICD-V72.84) the patient has history of DJD, needs surgery in her right shoulder The last objective evaluation of her heart was in 2003, at times she was having palpitation, Myoview was negative. Echocardiogram showed ejection fraction 65%, slightly decreased LV compliance. She has tolerated back surgery well last year plan: EKG today is at baseline Labs we'll clear her for surgery,to  continue with all her medications during the perioperative time    Orders: EKG w/ Interpretation (93000)  Problem # 2:  HYPERTENSION (ICD-401.9) at goal  Her updated medication list for this problem includes:    Micardis 40 Mg Tabs (Telmisartan) .Marland Kitchen... 1 by mouth once daily    Cartia Xt 180 Mg Cp24 (Diltiazem hcl coated beads) .Marland Kitchen... Take 1 capsule by mouth once a day  Orders: Venipuncture (04540) TLB-BMP (Basic Metabolic Panel-BMET) (80048-METABOL) TLB-CBC Platelet - w/Differential (85025-CBCD) Specimen Handling (98119) UA Dipstick w/o Micro (manual) (81002)  BP today: 124/86 Prior BP: 120/76 (10/15/2009)  Labs Reviewed: K+: 4.4 (06/10/2009) Creat: : 1.0 (06/10/2009)   Chol: 173 (06/10/2009)   HDL: 79.80 (06/10/2009)   LDL: 61 (06/10/2009)   TG: 159.0 (06/10/2009)  Problem # 3:  has some urinary  frecuency requests a UA  Complete Medication List: 1)  Micardis 40 Mg Tabs (Telmisartan) .Marland Kitchen.. 1 by mouth once daily 2)  Cartia Xt 180 Mg Cp24 (Diltiazem hcl coated beads) .... Take 1 capsule by mouth once a day 3)  Lipitor 40 Mg Tabs (Atorvastatin calcium) .Marland Kitchen.. 1 by mouth at bedtime 4)  Premarin 0.3 Mg Tabs (Estrogens conjugated) .... Take 1 tablet by mouth once a day 5)  Adult Aspirin Low Strength 81 Mg Tbdp (Aspirin) .Marland Kitchen.. 1 tab once daily 6)  Lidoderm 5 % Ptch (Lidocaine) .Marland Kitchen.. 1 patch as directed (no more than 3 daily) - do not use for more than 12 hours (for knee pain) 7)  Neurontin 100 Mg Caps (Gabapentin) .Marland Kitchen.. 1-2 by mouth three times a day and 3 at bedtime as needed 8)  Citrucel 500 Mg Tabs (Methylcellulose (laxative)) .... As needed daily 9)  Calcium Carbonate-vitamin D 600-400 Mg-unit Tabs (Calcium carbonate-vitamin d) .Marland Kitchen.. 1 tab by mouth  every morning and 2 by mouth  every evening 10)  One-daily Multivitamins Tabs (Multiple vitamin) .... Take 1 tablet by mouth once a day 11)  Vitamin C 500 Mg Tabs (Ascorbic acid) .... Take 1/2 tablet by mouth once a day 12)  Ginkgo Biloba 120 Mg Tabs (Ginkgo biloba) .... Take 1 tablet by mouth once a day 13)  Zyrtec Allergy 10 Mg Tabs (Cetirizine hcl) .... Take 1 tablet by mouth once a day as needed 14)  Hydrocortisone 2.5 % Crea (Hydrocortisone) .... Apply twice a day for 10 days 15)  Ketoconazole 2 % Crea (Ketoconazole) .... Apply twice a day for 10 days 16)  Advil 200 Mg Caps (Ibuprofen) .... As needed  Patient Instructions: 1)  Please schedule a follow-up appointment in 3 months .   Laboratory Results   Urine Tests   Date/Time Reported: December 04, 2009 12:57 PM   Routine Urinalysis   Color: yellow Appearance: Clear Glucose: negative   (Normal Range: Negative) Bilirubin: negative   (Normal Range: Negative) Ketone: negative   (Normal Range: Negative) Spec. Gravity: <1.005   (Normal Range: 1.003-1.035) Blood: negative   (Normal Range:  Negative) pH: 6.0   (Normal Range: 5.0-8.0) Protein: negative   (Normal Range: Negative) Urobilinogen: negative   (Normal Range: 0-1) Nitrite: negative   (Normal Range: Negative) Leukocyte Esterace: negative   (Normal Range: Negative)    Comments: Floydene Flock  December 04, 2009 12:57 PM

## 2010-06-09 ENCOUNTER — Other Ambulatory Visit: Payer: Self-pay | Admitting: Internal Medicine

## 2010-06-09 ENCOUNTER — Encounter: Payer: Self-pay | Admitting: Internal Medicine

## 2010-06-09 ENCOUNTER — Ambulatory Visit (INDEPENDENT_AMBULATORY_CARE_PROVIDER_SITE_OTHER): Payer: Medicare Other | Admitting: Internal Medicine

## 2010-06-09 DIAGNOSIS — I10 Essential (primary) hypertension: Secondary | ICD-10-CM

## 2010-06-09 DIAGNOSIS — E785 Hyperlipidemia, unspecified: Secondary | ICD-10-CM

## 2010-06-09 DIAGNOSIS — R51 Headache: Secondary | ICD-10-CM

## 2010-06-09 LAB — LIPID PANEL
HDL: 63.7 mg/dL (ref >39.00)
Total CHOL/HDL Ratio: 2

## 2010-06-09 LAB — AST: AST: 20 U/L (ref 0–37)

## 2010-06-09 LAB — ALT: ALT: 11 U/L (ref 0–35)

## 2010-06-10 ENCOUNTER — Telehealth: Payer: Self-pay | Admitting: Internal Medicine

## 2010-06-15 ENCOUNTER — Telehealth: Payer: Self-pay | Admitting: Internal Medicine

## 2010-06-16 NOTE — Progress Notes (Signed)
Summary: Ultram RX  Phone Note Call from Patient Call back at Home Phone [phone] Call back at Work Phone [phone]   Summary of Call: Patient left message on triage that she was seen yesterday and was to have prescription for Ultram sent to [facility] on [address]. She has checked with the pharmacy and they do not have it. Please advise. Initial call taken by: Lucious Groves CMA,  June 10, 2010 9:51 AM  Follow-up for Phone Call        i don't recall sending a Rx but if she needs tramadol 50mg  1 by mouth qid as needed ok to call #[phone], no RF. Ask about allergies to tramadol Jaquala Fuller E. Marimar Suber MD  June 10, 2010 2:06 PM   Additional Follow-up for Phone Call Additional follow up Details #1::        I spoke with the patient and she denies allergy to Tramadol. Additional Follow-up by: Lucious Groves CMA,  June 10, 2010 2:12 PM    New/Updated Medications: TRAMADOL HCL 50 MG TABS (TRAMADOL HCL) 1 by mouth qid as needed pain Prescriptions: TRAMADOL HCL 50 MG TABS (TRAMADOL HCL) 1 by mouth qid as needed pain  #60 x 0   Entered by:   Lucious Groves CMA   Authorized by:   Nolon Rod. Kyandra Mcclaine MD   Signed by:   Lucious Groves CMA on 06/10/2010   Method used:   Telephoned to ...       [facility] [facility] [phone]* (retail)       [address]       [place], [place]  [address]       Ph: [phone]       Fax: [phone]   RxID:   [id number]

## 2010-06-16 NOTE — Assessment & Plan Note (Signed)
Summary: 3 MONTH FU/SPH   Vital Signs:  Patient profile:   77 year old female Weight:      153.38 pounds Pulse rate:   81 / minute Pulse rhythm:   regular BP sitting:   124 / 84  (left arm) Cuff size:   regular  Vitals Entered By: Army Fossa CMA (June 09, 2010 10:12 AM) CC: 4 month f/u- fasting  Comments states that once a month her BP is elevated and she will have a HA. [facility]    History of Present Illness:  4 month f/u- fasting   HTN-- BP most of the time   ~124/65, sometimes gets elevated, never more than 140 good medication compliance   HA-- moderate to severe HAs  ~ 1 month, last x 1 -2 days, location is B (global) worse on the R? no associated photo or phonphobia sometimes has nausea , mild  recent HAs not the worse of her life, last HA 2 weeks ago no associated dizzines, diplopia, slurred speach   HYPERLIPIDEMIA -- good medication compliance   KNEE PAIN, BACK PAIN, LUMBAR, CHRONIC--- 0n neurontin, tylenol, lidoderm now also w/  shoulder poain, plans to see ortho      Current Medications (verified): 1)  Micardis 40 Mg  Tabs (Telmisartan) .Marland Kitchen.. 1 By Mouth Once Daily 2)  Cartia Xt 180 Mg Cp24 (Diltiazem Hcl Coated Beads) .... Take 1 Capsule By Mouth Once A Day 3)  Lipitor 40 Mg Tabs (Atorvastatin Calcium) .Marland Kitchen.. 1 By Mouth At Bedtime 4)  Premarin 0.3 Mg Tabs (Estrogens Conjugated) .... Take 1 Tablet By Mouth Once A Day 5)  Adult Aspirin Low Strength 81 Mg  Tbdp (Aspirin) .Marland Kitchen.. 1 Tab Once Daily 6)  Lidoderm 5 %  Ptch (Lidocaine) .Marland Kitchen.. 1 Patch As Directed (No More Than 3 Daily) - Do Not Use For More Than 12 Hours (For Knee Pain) 7)  Neurontin 100 Mg Caps (Gabapentin) .... 3 At Bedtime As Needed 8)  Calcium Carbonate-Vitamin D 600-400 Mg-Unit  Tabs (Calcium Carbonate-Vitamin D) .Marland Kitchen.. 1 Tab By Mouth  Every Morning and 2 By Mouth Every Evening 9)  One-Daily Multivitamins   Tabs (Multiple Vitamin) .... Take 1 Tablet By Mouth Once A Day 10)  Ginkgo Biloba  120 Mg Tabs (Ginkgo Biloba) .... Take 1 Tablet By Mouth Once A Day 11)  Zyrtec Allergy 10 Mg Tabs (Cetirizine Hcl) .... Take 1 Tablet By Mouth Once A Day As Needed 12)  Stool Softener 250 Mg Caps (Docusate Sodium) .... As Needed 13)  Osteo Bi-Flex Adv Triple St  Tabs (Misc Natural Products) .... One Tablet By Mouth Once Daily 14)  Fluconazole 150 Mg Tabs (Fluconazole) .Marland Kitchen.. 1 By Mouth Once Daily X1 ,  May Repeat in 3 Days As Needed  Allergies (verified): 1)  ! Codeine 2)  ! Nortriptyline Hcl 3)  ! Amitriptyline Hcl 4)  ! Carisoprodol 5)  ! * Bextra 6)  ! Daypro 7)  ! Imitrex 8)  ! Mobic 9)  ! Vioxx 10)  ! Phenergan 11)  ! Metronidazole  Past History:  Past Medical History: HYPERLIPIDEMIA  HTN ANXIETY and DEPRESSION  PULMONARY NODULES.......................................................................Marland KitchenWert     - First seen by CT only 05/04/03, no change on f/u 08/08/06     - Macroscopic changes rml 04/27/07 > resolved July 23, 2008      - no further w/u  KNEE PAIN, RIGHT, CHRONIC  BACK PAIN, LUMBAR, CHRONIC  h/o SVT--sees Dr Tenny Craw  H/O HYDRONEPHROSIS, RIGHT 2008 s/p  urology eval/CT/cysto: observation/monitor Headache GI-----Hx of colon polyps 2004, Diverticulosis, Internal Hemorrhoids  Past Surgical History: Reviewed history from 03/10/2010 and no changes required. Hysterectomy RIGHT ROTATOR CUFF 9/11 TKR--R Cataract extraction 2008 Dr Hazle Quant exploratory lap 2005, R ovary removed Lumbar reconstructive surgery 01/07/2009 Appendectomy Cholecystectomy Shoulder Surgery-Left  Tonsillectomy Bladder Repair R rottator cuff 9-11  Social History: Reviewed history from 06/10/2009 and no changes required. widow 1 son moved to a retirement home (05-2008) [place] , has 3 meals a day, has a cleaning service  Patient never smoked  ETOH-- not in 2 years  Review of Systems CV:  Denies chest pain or discomfort and swelling of feet. Resp:  Denies cough and wheezing. GI:   Denies bloody stools, diarrhea, nausea, and vomiting.  Physical Exam  General:  alert, well-developed, and well-nourished.   Head:  face symetric , no TTP @ sinus  Lungs:  normal respiratory effort, no intercostal retractions, and no accessory muscle use.  slightly  decreased BS Heart:  normal rate, regular rhythm, and no murmur.   Extremities:  no pretibial edema bilaterally  Neurologic:  alert & oriented X3.   speach fluent and clear face and motor symetric DTRs symetric  gait normal EOMI, pupils hyporeactive (had surgery)   Impression & Recommendations:  Problem # 1:  HYPERTENSION (ICD-401.9) at goal, occasionally SBP goes to 140 no change on meds Her updated medication list for this problem includes:    Micardis 40 Mg Tabs (Telmisartan) .Marland Kitchen... 1 by mouth once daily    Cartia Xt 180 Mg Cp24 (Diltiazem hcl coated beads) .Marland Kitchen... Take 1 capsule by mouth once a day  BP today: 124/84 Prior BP: 122/68 (03/16/2010)  Labs Reviewed: K+: 5.0 (02/09/2010) Creat: : 1.0 (02/09/2010)   Chol: 173 (06/10/2009)   HDL: 79.80 (06/10/2009)   LDL: 61 (06/10/2009)   TG: 159.0 (06/10/2009)  Problem # 2:  HEADACHE (ICD-784.0) i'm concerned about her repetitive HAs, dx w/  migraines years ago (in [place]) migraines? thunderclap? last CT head 2009, exssentially neg plan: neuro referal, labs , ER if symptoms severe  Her updated medication list for this problem includes:    Adult Aspirin Low Strength 81 Mg Tbdp (Aspirin) .Marland Kitchen... 1 tab once daily  Orders: TLB-Sedimentation Rate (ESR) (85652-ESR) Specimen Handling (16109) Neurology Referral (Neuro)  Problem # 3:  HYPERLIPIDEMIA (ICD-272.4) due for labs  Her updated medication list for this problem includes:    Lipitor 40 Mg Tabs (Atorvastatin calcium) .Marland Kitchen... 1 by mouth at bedtime    Labs Reviewed: SGOT: 17 (06/10/2009)   SGPT: 11 (06/10/2009)   HDL:79.80 (06/10/2009), 62.20 (12/11/2008)  LDL:61 (06/10/2009), 71 (10/16/2007)  Chol:173  (06/10/2009), 153 (12/11/2008)  Trig:159.0 (06/10/2009), 212.0 (12/11/2008)  Orders: Venipuncture (60454) TLB-Lipid Panel (80061-LIPID) TLB-ALT (SGPT) (84460-ALT) TLB-AST (SGOT) (84450-SGOT) Specimen Handling (09811)  Complete Medication List: 1)  Micardis 40 Mg Tabs (Telmisartan) .Marland Kitchen.. 1 by mouth once daily 2)  Cartia Xt 180 Mg Cp24 (Diltiazem hcl coated beads) .... Take 1 capsule by mouth once a day 3)  Lipitor 40 Mg Tabs (Atorvastatin calcium) .Marland Kitchen.. 1 by mouth at bedtime 4)  Premarin 0.3 Mg Tabs (Estrogens conjugated) .... Take 1 tablet by mouth once a day 5)  Adult Aspirin Low Strength 81 Mg Tbdp (Aspirin) .Marland Kitchen.. 1 tab once daily 6)  Lidoderm 5 % Ptch (Lidocaine) .Marland Kitchen.. 1 patch as directed (no more than 3 daily) - do not use for more than 12 hours (for knee pain) 7)  Neurontin 100 Mg Caps (Gabapentin) .... 3 at  bedtime as needed 8)  Calcium Carbonate-vitamin D 600-400 Mg-unit Tabs (Calcium carbonate-vitamin d) .Marland Kitchen.. 1 tab by mouth  every morning and 2 by mouth every evening 9)  One-daily Multivitamins Tabs (Multiple vitamin) .... Take 1 tablet by mouth once a day 10)  Ginkgo Biloba 120 Mg Tabs (Ginkgo biloba) .... Take 1 tablet by mouth once a day 11)  Zyrtec Allergy 10 Mg Tabs (Cetirizine hcl) .... Take 1 tablet by mouth once a day as needed 12)  Stool Softener 250 Mg Caps (Docusate sodium) .... As needed 13)  Osteo Bi-flex Adv Triple St Tabs (Misc natural products) .... One tablet by mouth once daily 14)  Fluconazole 150 Mg Tabs (Fluconazole) .Marland Kitchen.. 1 by mouth once daily x1 ,  may repeat in 3 days as needed  Patient Instructions: 1)  Please schedule a follow-up appointment in 4 months . Physical exam     Orders Added: 1)  Venipuncture [36415] 2)  TLB-Lipid Panel [80061-LIPID] 3)  TLB-ALT (SGPT) [84460-ALT] 4)  TLB-AST (SGOT) [84450-SGOT] 5)  TLB-Sedimentation Rate (ESR) [85652-ESR] 6)  Specimen Handling [99000] 7)  Est. Patient Level IV [84166] 8)  Neurology Referral [Neuro]

## 2010-06-25 NOTE — Progress Notes (Signed)
Summary: Tramadol and referral  Phone Note Call from Patient   Summary of Call: Patient left message on triage not to worry about her headaches. She notes that she does not want referral. She also notes that she has taken Tramadol in 2011 and it made her heart race. Patient notes that she will not be picking up that prescription. Per the patient, no further action required.   Please advise. Initial call taken by: Lucious Groves CMA,  June 15, 2010 10:11 AM  Follow-up for Phone Call         I still thinks that she needs to see neurology. Please go ahead with a referral if patient is in agreement Follow-up by: Jose E. Paz MD,  June 15, 2010 12:34 PM  Additional Follow-up for Phone Call Additional follow up Details #1::        Left message on voicemail to call back to office. Lucious Groves CMA  June 15, 2010 12:38 PM   I spoke with the patient and she will reconsider referral, and decided that she will go. Referral entered. Lucious Groves CMA  June 16, 2010 9:09 AM

## 2010-07-10 ENCOUNTER — Other Ambulatory Visit: Payer: Self-pay | Admitting: Specialist

## 2010-07-10 DIAGNOSIS — R51 Headache: Secondary | ICD-10-CM

## 2010-07-13 ENCOUNTER — Ambulatory Visit
Admission: RE | Admit: 2010-07-13 | Discharge: 2010-07-13 | Disposition: A | Discharge status: Home / Self Care | Payer: Medicare Other | Source: Ambulatory Visit | Attending: Specialist | Admitting: Specialist

## 2010-07-13 DIAGNOSIS — R51 Headache: Secondary | ICD-10-CM

## 2010-07-16 ENCOUNTER — Other Ambulatory Visit: Payer: Medicare Other

## 2010-07-24 LAB — ABO/RH: ABO/RH(D): O POS

## 2010-07-24 LAB — TYPE AND SCREEN
ABO/RH(D): O POS
Antibody Screen: NEGATIVE

## 2010-07-24 LAB — CBC
HCT: 33.9 % — ABNORMAL LOW (ref 36.0–46.0)
Hemoglobin: 11.7 g/dL — ABNORMAL LOW (ref 12.0–15.0)
MCV: 97.2 fL (ref 78.0–100.0)
RBC: 3.81 MIL/uL — ABNORMAL LOW (ref 3.87–5.11)
WBC: 11.1 10*3/uL — ABNORMAL HIGH (ref 4.0–10.5)
WBC: 8 10*3/uL (ref 4.0–10.5)

## 2010-07-24 LAB — CROSSMATCH

## 2010-07-24 LAB — BASIC METABOLIC PANEL
Chloride: 99 mEq/L (ref 96–112)
GFR calc non Af Amer: 53 mL/min — ABNORMAL LOW (ref >60)
Potassium: 3.9 mEq/L (ref 3.5–5.1)
Sodium: 133 mEq/L — ABNORMAL LOW (ref 135–145)

## 2010-08-20 ENCOUNTER — Other Ambulatory Visit: Payer: Self-pay | Admitting: Internal Medicine

## 2010-08-20 NOTE — Telephone Encounter (Signed)
Ok 90 and 1 RF 

## 2010-08-27 ENCOUNTER — Encounter: Payer: Self-pay | Admitting: Internal Medicine

## 2010-08-28 ENCOUNTER — Encounter: Payer: Self-pay | Admitting: Internal Medicine

## 2010-08-28 ENCOUNTER — Ambulatory Visit (INDEPENDENT_AMBULATORY_CARE_PROVIDER_SITE_OTHER)
Admission: RE | Admit: 2010-08-28 | Discharge: 2010-08-28 | Disposition: A | Discharge status: Home / Self Care | Payer: Medicare Other | Source: Ambulatory Visit | Attending: Internal Medicine | Admitting: Internal Medicine

## 2010-08-28 ENCOUNTER — Ambulatory Visit (INDEPENDENT_AMBULATORY_CARE_PROVIDER_SITE_OTHER): Payer: Medicare Other | Admitting: Internal Medicine

## 2010-08-28 VITALS — BP 108/62 | HR 69 | Wt 153.4 lb

## 2010-08-28 DIAGNOSIS — I10 Essential (primary) hypertension: Secondary | ICD-10-CM

## 2010-08-28 DIAGNOSIS — R0989 Other specified symptoms and signs involving the circulatory and respiratory systems: Secondary | ICD-10-CM

## 2010-08-28 DIAGNOSIS — R0600 Dyspnea, unspecified: Secondary | ICD-10-CM

## 2010-08-28 DIAGNOSIS — R0609 Other forms of dyspnea: Secondary | ICD-10-CM | POA: Insufficient documentation

## 2010-08-28 DIAGNOSIS — R0602 Shortness of breath: Secondary | ICD-10-CM

## 2010-08-28 DIAGNOSIS — R5383 Other fatigue: Secondary | ICD-10-CM

## 2010-08-28 DIAGNOSIS — R5381 Other malaise: Secondary | ICD-10-CM

## 2010-08-28 LAB — BASIC METABOLIC PANEL
BUN: 19 mg/dL (ref 6–23)
GFR: 58.25 mL/min — ABNORMAL LOW (ref >60.00)
Potassium: 4 mEq/L (ref 3.5–5.1)
Sodium: 142 mEq/L (ref 135–145)

## 2010-08-28 LAB — CBC WITH DIFFERENTIAL/PLATELET
Eosinophils Absolute: 0.1 10*3/uL (ref 0.0–0.7)
MCHC: 34.3 g/dL (ref 30.0–36.0)
MCV: 99.8 fl (ref 78.0–100.0)
Monocytes Absolute: 0.6 10*3/uL (ref 0.1–1.0)
Neutrophils Relative %: 66.1 % (ref 43.0–77.0)
Platelets: 286 10*3/uL (ref 150.0–400.0)
RDW: 13.5 % (ref 11.5–14.6)

## 2010-08-28 NOTE — Patient Instructions (Signed)
call anytime if your symptoms become severe, you have chest pain, swelling, fever, cough or severe palpitations.

## 2010-08-28 NOTE — Assessment & Plan Note (Signed)
Overcontrolled? Decrease micardis to 20mg  qd

## 2010-08-28 NOTE — Assessment & Plan Note (Addendum)
3-4 weeks history of dyspnea on exertion. She also has generalized fatigue/low energy Chart is reviewed, history of SVTs, abnormal CT of the chest before. She had a negative echocardiogram in 2009. Cardiovascular and lung exam normal, she's not volume overload. She reports blood pressure to be in the low side.  EKG today essentially normal and no different to previous EKGs. Symptoms happening in the setting of starting Topamax. Up-to-date is reviewed----> Topamax may cause fatigue, and actually dyspnea in 1-3% of patients. Differential diagnosis is large, including possible side effects from Topamax. Plan: Adjust blood pressure medicines Labs (unable to check a B12 or vitamin D per coding restrictions ) Chest x-ray Echocardiogram Reassess in 2 weeks, if not better, will see their discontinued Topamax (we'll have to discuss that with neurology)  and cardiology referral Recommend to call anytime if her symptoms become severe, she has chest pain, edema, fever, cough or severe palpitations.

## 2010-08-28 NOTE — Progress Notes (Signed)
  Subjective:    Patient ID: [name], female    DOB: 12-06-1929, 77 y.o.   MRN: [id number]  HPI She went to neurology for treatment of headaches, she started Flexeril and Topamax, they discontinue tramadol , Tylenol and any pain medications. They gave her local injections in the head. After all of the above approximately 3 weeks ago she started to notice dyspnea on exertion. States that she get short of breath and agitated with going up the stairs or even walking on her small apartment. Previously, she was able to do that without difficulty. She recognizes that she is having side effects from Topamax including "tingling all over", dizziness, occasional confusion and decreased concentration. Per neurology's recommendation she continue with Topamax and they are even increasing the dose. In the last few days, she also recognizes that the side effects are decreasing. Topamax is actually helping quite a bit with headaches.  Past Medical History  Diagnosis Date  . Hyperlipidemia   . Hypertension   . Anxiety and depression   . Pulmonary nodules     Wert. First seen by CT only 05/04/03, no change on f/u 08/08/06 -Macroscopic changes rml 04/27/07 > resolved april 6,2010 no further w/u   . Knee pain     right chronic  . Back pain     lumbar  . SVT (supraventricular tachycardia)     sees Dr.Ross  . Hydronephrosis     Right 2008 s/p urology eval CT/cysto: observation/monitor  . Headache   . GI (gastrointestinal bleed)     hx of colon polyps 2004, diverticulosis, internal hemmorhids   Past Surgical History  Procedure Date  . Abdominal hysterectomy   . Rotator cuff repair 9/11  . Total knee arthroplasty     right  . Cataract extraction 2008    Dr.Digby  . R ovary removed   . Lumbar reconstructive surgery 01/07/2009  . Appendectomy   . Cholecystectomy   . Shoulder surgery     lefet  . Tonsillectomy   . Bladder repair      Review of Systems No chest pain, some subjective  palpitations but when she checked her pulse is normal. No orthopnea.  No lower extremity edema, no weight gain. In fact she has lost some weight because she is eating better. No nausea, vomiting, diarrhea or blood in the stools. Occasionally he has a mild cough she thinks due to allergies, no fever.     Objective:   Physical Exam  Constitutional: She is oriented to person, place, and time. She appears well-developed and well-nourished. No distress.  HENT:  Head: Normocephalic and atraumatic.  Neck: Normal range of motion. Neck supple. No JVD present. No thyromegaly present.  Cardiovascular: Normal rate, regular rhythm and normal heart sounds.   No murmur heard. Pulmonary/Chest: Effort normal and breath sounds normal. No respiratory distress. She has no wheezes. She has no rales.  Abdominal: Soft. She exhibits no distension. There is no rebound.       Mild tenderness to palpation of the lower abdomen which is not uncommon for the patient.  Musculoskeletal: She exhibits no edema.  Neurological: She is alert and oriented to person, place, and time.  Skin: Skin is warm and dry.  Psychiatric:       Mildly anxious, at baseline          Assessment & Plan:

## 2010-08-31 ENCOUNTER — Telehealth: Payer: Self-pay | Admitting: *Deleted

## 2010-08-31 DIAGNOSIS — R0600 Dyspnea, unspecified: Secondary | ICD-10-CM

## 2010-08-31 NOTE — Telephone Encounter (Signed)
Message copied by Army Fossa on Mon Aug 31, 2010  2:50 PM ------      Message from: Donzetta Kohut      Created: Mon Aug 31, 2010  2:45 PM       Below is a request for an Echo that was sent as a message to me, but there are no Orders for this and need to be.            ----- Message -----         From: Wanda Plump, MD         Sent: 08/28/2010   5:29 PM           To: Donzetta Kohut            Please schedule a ECHOCARDIOGRAM   Dx DOE, see OV

## 2010-09-01 ENCOUNTER — Telehealth: Payer: Self-pay | Admitting: *Deleted

## 2010-09-01 DIAGNOSIS — R0600 Dyspnea, unspecified: Secondary | ICD-10-CM

## 2010-09-01 NOTE — Procedures (Signed)
NAMEBESSIE, BOYTE NO.:  [id number]   MEDICAL RECORD NO.:  [id number]           PATIENT TYPE:   LOCATION:                                 FACILITY:   PHYSICIAN:  Erick Colace, M.D.DATE OF BIRTH:  October 28, 1929   DATE OF PROCEDURE:  08/01/2008  DATE OF DISCHARGE:                               OPERATIVE REPORT   PROCEDURE:  This is a left L4-5 transforaminal lumbar epidural steroid  injection under fluoroscopic guidance.   INDICATIONS:  Lumbar radiculitis, left lower extremity L4.  She has had  good relief in the past with L4-5 transforaminal injection, last  performed in November 2009.  It started wear-off in the last month or  two.  The pain only partially relieved by medication management and  interfering with self-care and mobility, rated as 7/10.   DESCRIPTION:  Informed consent was obtained after describing risks and  benefits of the procedure with the patient.  These include bleeding,  bruising, infection, temporary or permanent paralysis.  She elects  proceed and has given written consent.  The patient was placed prone on  fluoroscopy table.  Betadine prep, sterile drape, 25-gauge 1-1/2-inch  needle was used to anesthetize the skin and subcutaneous tissue, 1%  lidocaine x2 mL and a 22-gauge 2-1/2 inch spinal needle was inserted  under fluoroscopic guidance, starting left L4-5 intervertebral foramen.  AP, lateral, and oblique images were utilized.  Omnipaque 180 times 1 mL  demonstrated no intravascular take and good epidural nerve root spread  followed by injection of 1 mL of 1% MPF lidocaine plus 1 mL of 10 mg/mL  dexamethasone.  The patient tolerated the procedure well.  Pre and post  injection vitals stable.  Post injection instructions given.  Followup  with Dr. Pamelia Hoit next month.      Erick Colace, M.D.  Electronically Signed     AEK/MEDQ  D:  08/01/2008 12:26:25  T:  08/02/2008 04:48:50  Job:  [id number]

## 2010-09-01 NOTE — Assessment & Plan Note (Signed)
Ms. Baptista is a 77 year old who was seen today in our [facility]. She was last seen by me on Aug 25, 2006 and she is  a patient of Dr. [name].   She is back in today for a refill of her medications. Since she was seen  last she has undergone multiple diagnostic tests as well as a procedure  to open up her right ureter which apparently remains stenotic at this  point.   She is currently following up with her primary care doctor as well as a  nephrologist.   She is back in today for management of her low back and leg pain.   She states her average pain is about an 8 on a scale of 10 without  medications. It gets down to about a 6 with the medications that she is  on.   At the last visit she was started on some Neurontin 100 mg up to 3 times  a day, as well as some Ultracet. She states that these seemed to have  helped quite a bit.   She does report her nephrologist would like her to discontinue her  Celebrex which has been prescribed through Dr. Leta Jungling office and she will  be discussing this further with Dr. Drue Novel next week. She does have  concerns about going off of this medication.   Overall her sleep is quite good.   Regarding her functional status she is able to stand and walk less than  typically 5 minutes at a time. She has noted over the last 3 years that  she is really unable to performed many tasks in the kitchen for any  length of time. She is tending to use frozen microwavable meals rather  than prepare them herself because she has difficultly standing for any  length of time due to the pain in her low back and lower extremities.   She has noticed some mild problems with forgetfulness which she  attributes possibly due to the pain medications and a slightly increased  unsteadiness which at this point is really not bothersome for her.   She has not had any falls.   Social and family history otherwise unchanged from last visit.   MEDICATIONS:   Provided by this clinic include;  1. Lidoderm on a p.r.n. basis 12 hours on 12 hours off.  2. Neurontin 100 mg up to 3 times a day.  3. Ultracet up to 3 times a day p.r.n.   PHYSICAL EXAMINATION:  Today, her blood pressure is 119/56, pulse 63,  respirations 16, 95% saturated on room air.  Well-developed, well-nourished female who appears her stated age and who  does not appear in any distress. She is oriented x3. Her speech is  clear. She follows commands without difficultly. Her affect is bright,  alert, cooperative.   She transitions from sitting-to-standing without much difficultly. Her  gait displays good balance overall. She has some difficultly with tandem  gait. She is able to perform a Romberg's test adequately.   She has limitations in lumbar motion, just mildly especially with  lateral flexion.  Lumbar extension, and flexion are actually quite good  for her age.   Her reflexes are 2 + at the patellar tendons and Achilles tendons. Her  motor strength is quite good with the exception of bilateral weakness in  her EHLs.   No sensory deficits are appreciated other than over the right thigh and  knee area.   No abnormal tone  is noted.   IMPRESSION:  1. Right L5 root encroachment.  2. Left L4 and potentially left L3 root encroachment.  3. A history of L5, S1 decompressive laminectomy and discectomy.  4. Bilateral lower extremity pain.  5. Status post right knee replacement.  6. Chronic low back pain.  7. Symptoms of stenosis.   PLAN:  We discussed various options in treating her pain. She has found  that the Neurontin has been quite beneficial. She is requesting 1 extra  dose a day. She is currently on 100 mg 3 time a day, and we will allow  her to take up to 4, but no more than that due to concern for problems  with dizziness and or gait instability.   We will refill her Ultracet as well up to 3 times per day for back and  leg pain.   We will also recommend she  follow up in physical therapy for a TENS unit  evaluation and as she is set up for an epidural steroid injection on  October 13, 2006. If she does well with this we may be able to discontinue  her Ultracet and Neurontin all together. We will continue to monitor her  balance, assess for assisted devices as needed. At this point she is  stable on these medications and is getting good relief with their use.   I will see her back in a month.           ______________________________  Brantley Stage, M.D.     DMK/MedQ  D:  09/28/2006 13:49:47  T:  09/28/2006 17:25:17  Job #:  [id number]   cc:   [name], MD  [phone] [address] [place], [place] [address]

## 2010-09-01 NOTE — Procedures (Signed)
[name], [name]             ACCOUNT NO.:  [id number]   MEDICAL RECORD NO.:  [id number]          PATIENT TYPE:  REC   LOCATION:  [facility]                          FACILITY:  [facility]   PHYSICIAN:  Erick Colace, M.D.DATE OF BIRTH:  [birth year]   DATE OF PROCEDURE:  07/31/2007  DATE OF DISCHARGE:                               OPERATIVE REPORT   PROCEDURE:  Bilateral L5 dorsal ramus injection, bilateral L4 medial  branch block, bilateral L3 medial branch block under fluoroscopic  guidance.  Lumbar fluoroscopic guidance and sacral fluoroscopic  guidance.   INDICATIONS:  Lumbar facet mediated pain, lumbar spondylosis.  Pain is  only partially responsive to medication management and other  conservative care and interferes with activities of daily living and  mobility.  She has had previous injection corresponding levels January 02, 2007 with 6 months improvement of symptoms.   Informed consent was obtained after describing risks and benefits of the  procedure with the patient.  These include bleeding, bruising,  infection, temporary or permanent paralysis.  She elects to proceed and  has given written consent.  The patient placed prone on fluoroscopy  table.  Betadine prep, sterile drape, 25-gauge inch and a half needle  was used to anesthetize the skin and subcutaneous tissue, 1% lidocaine  x2 mL.  A 22 gauge 3-1/2 inch spinal needle was inserted under  fluoroscopic guidance, first charting the left S1 SAP sacral ala  junction, bone contact made, confirmed with lateral imaging.  Omnipaque  180 x 0.5 mL demonstrated no intravascular uptake.  Then 0.5 mL with a  solution containing 1 mL of 40 mg/mL dexamethasone and 2 mL of 2% MPF  lidocaine were injected.  Then the left L5 SAP transverse junction  targeted bone contact made, confirmed with lateral imaging.  Omnipaque  180 x 0.5 mL demonstrated no intravascular uptake and 0.5 mL of  dexamethasone lidocaine solution were injected.   Then the C-arm was  moved into the lumbar region, left L4 SAP transverse junction targeted,  bone contact made confirmed with lateral imaging.  Omnipaque 180 x 0.5  mL demonstrated no intravascular uptake, then 0.5 mL of dexamethasone  lidocaine solution was injected.  The same procedure was done on the  right side at corresponding levels using same needle technique and  injectate.      Erick Colace, M.D.  Electronically Signed     AEK/MEDQ  D:  07/31/2007 11:26:24  T:  07/31/2007 12:07:51  Job:  [id number]   cc:   [name], MD  [phone] [address]  [place], [place] [address]   Brantley Stage, M.D.  Fax: [phone]

## 2010-09-01 NOTE — Assessment & Plan Note (Signed)
[facility]                             PULMONARY OFFICE NOTE   NAME:[name], [name]                    MRN:          [id number]  DATE:10/18/2006                            DOB:          May 30, 1929    REFERRING PHYSICIAN:  [name], MD   REASON FOR CONSULTATION:  Pulmonary nodules.   HISTORY:  This is a 77 year old white female never-smoker with chronic  back pain for whom a CT scan of the spine was done in 2005 indicating a  right middle lobe nodule.  This was repeated on April 21 with  documentation of no change on CT scan of the right middle lobe nodules,  but 2 tiny left lower lobe nodules were detected with recommendation  that followup CT scan be done in 6 to 12 months.  The patient denies any  pleuritic pain, fevers,chills, sweats, significant rheumatologic  disease, history of cancer, unintended weight loss, or dyspnea.   PAST MEDICAL HISTORY:  Significant for the problems as noted above.  She  is status post cholecystectomy and remote hysterectomy all for benign  reasons.   ALLERGIES:  An impressively long list of allergies, all inventoried on  our medication inventory face sheet.   MEDICATIONS:  Also recorded on the medication face sheet dated October 18, 2006, reviewed with the patient and correct as listed.   SOCIAL HISTORY:  She has never smoked.  She did grow up exposed to some  cigar smoke by her father.   FAMILY HISTORY:  Positive for emphysema in her brother, who was a  smoker, and also bladder cancer in her brother.  No lung cancer.   REVIEW OF SYSTEMS:  Taken in detail on the worksheet and negative,  except as outlined above.   PHYSICAL EXAMINATION:  This is a very healthy-appearing slightly anxious  white female in no acute distress.  VITAL SIGNS:  Stable vital signs.  HEENT:  Unremarkable.  Nasal turbinates normal.  Ear canals clear  bilaterally.  NECK:  Supple without cervical adenopathy or tenderness. Trachea is  midline.  No thyromegaly.  LUNGS:  The lung fields are perfectly clear bilaterally to auscultation  and percussion.  CARDIAC:  Regular rate and rhythm without murmur, gallop, or rub.  ABDOMEN:  Soft and benign.  EXTREMITIES:  Warm without calf tenderness, cyanosis, clubbing, or  edema.   Chest x-ray was reviewed from June 11, 2006 and is normal.  CT scan  of the abdomen with touch to the lower chest was reviewed from April 21  with the above findings.   IMPRESSION:  Multiple pulmonary nodules.  The 1 on the right was present  in 2005 and, therefore, can be declared radiographically benign.  The 2  on the left are so small they very well could have been missed depending  on how the slices were done in 2005.  This is a very low-risk situation  in this patient with minimum smoking exposure, and since there are  several different nodules to be concerned about, all I would do is  recommend a chest x-ray at 6 months and then yearly  thereafter.  If, in  the meantime, she needs to be scanned for other reasons, then certainly  I would include the lower lobes in the CT scan, but advised the patient  that it is not cost effective, nor worth the radiation exposure, to have  multiple CT scans done for the purpose of following up multiple nodules,  and explained why that is (multiple nodules by definition would not be  an early sign of lung cancer, which was the main concern that she had  today).     [name]. Lauren Sires, MD, [facility]  Electronically Signed    MBW/MedQ  DD: 10/18/2006  DT: 10/18/2006  Job #: [id number]   cc:   Lauren Ora, MD

## 2010-09-01 NOTE — Assessment & Plan Note (Signed)
[name] is a pleasant 77 year old widowed female who is followed in  our [facility] and Rehabilitative [facility] for chronic low back pain, bilateral  knee pain, shoulder pain, and left wrist pain.   She has arthritis in multiple joints.   She is status post right knee replacement as well and has had chronic  knee pain since then.   She is back in today for a brief recheck.  She has been using p.r.n.  Lidoderm, which she feels makes her heart rate intermittently, so she  has discontinued this.  She also use this Ultracet twice a day and  Neurontin up to twice a day.   Her average pain is about 3-4 on a scale of 10, moderately interfering  with activity levels.  Sleep is fair.  Pain is worse initially in the  morning and towards the end of the day.  Pain is worse with activities,  improves with rest, heat, exercise, pacing her activities, medications,  TENS unit, and knee injections.   She reports fair relief with the medications.   FUNCTIONAL STATUS:  She is able to walk about 30 minutes at a time.  She  is able to climb stairs and drive.  She is independent with self care  and needs assistance with heavier household tasks.   Denies problems controlling bowel or bladder.  Occasionally, has  problems with irritable bowel syndromes and abdominal pain related to  this, otherwise no changes.   PAST MEDICAL, SOCIAL, AND FAMILY HISTORY:  Unchanged.   She has had some recent mammograms done in annual check up Dr. Drue Novel.   PHYSICAL EXAMINATION:  VITAL SIGNS:  On exam today, her blood pressure  is 122/72, pulse 74, respirations 18, and 98% saturated on room air.  GENERAL:  She is a well-developed elderly female who does not appear in  any distress.  She is oriented x3.  Speech is clear.  Affect is bright.  She is alert, cooperative, and pleasant.  She follows commands easily.  NEURO:  Cranial nerves are grossly intact.  Coordination is intact.  Reflexes are 1+ at the patellar and Achilles  tendons.  Sensation is  intact.  Motor strength is 5/5 without focal deficit.  She has limitations in lumbar motion in all planes and very limited  extension with flat and lumbar lordosis.   IMPRESSION:  1. Lumbar and cervical spondylosis.  2. Lumbago.  3. Status post laminectomy at L5-S1 remotely.  4. History of facet arthropathy.  5. Right knee pain status post total knee replacement.  6. Intermediate bilateral lower extremity pain, which is most likely      neuropathic in nature, which has responded to epidural steroid      injection multiple times.  7. Bilateral shoulder pain and intermittent left wrist pain secondary      to degenerative joint disease currently followed by Dr. Josephine Igo for this.   PLAN:  She does not need refills on her medications today.  She will  give [place] a call when she needs refills, which should be in November.  We  will see her back her in 3 months.  Her last epidural injection was back  in June.  She  has been stable on these medications.  She has not had any significant  side effects and reports overall improvement in function and pain scores  with their use.           ______________________________  Brantley Stage, M.D.  DMK/MedQ  D:  01/24/2008 11:35:13  T:  01/25/2008 04:12:39  Job #:  [id number]   cc:   [name], MD  [phone] [address] [place], [place] [address]

## 2010-09-01 NOTE — Assessment & Plan Note (Signed)
[facility]                            CARDIOLOGY OFFICE NOTE   NAME:[name], [name]                    MRN:          [id number]  DATE:08/31/2007                            DOB:          1929-08-04    IDENTIFICATION:  The patient is a 76 year old woman who I have followed  periodically in clinic.  She was last seen back in May 2008.  She has a  history of palpitations and reported mitral valve prolapse.   Since seen, her biggest complaint was several weeks ago she was  complaining of some headaches.  She was seen by Dr. Drue Novel and found to be  hypertensive and placed on Micardis; she is actually on 40 per day and  feeling better now at her blood pressure is under better control.  She  did have some x-ray or radiologic evaluation done and told she had only  1 functioning kidney and blood work on [employer], though she said it  was okay.   She denies palpitations.  No dizziness.  No chest pressure.   CURRENT MEDICINES:  1. Aspirin 81.  2. Xanax 0.5.  3. Lipitor 40.  4. Premarin 0.3.  5. Cartia 180/240?  6. MiraLax.  7. Neurontin.  8. Calcium.  9. Multivitamin.  10.Vitamin C.  11.Ginkgo biloba.  12.Iron.  13.Omega-3.  14.Micardis.   PHYSICAL EXAM  The patient is in no distress.  Blood pressure 122/77.  Pulse is 81 and  regular.  Weight 154.  LUNGS:  Clear.  CARDIAC:  Regular rate rhythm.  S1 and S2.  No S3.  No significant  murmurs.  ABDOMEN:  Benign.  EXTREMITIES:  No edema.   TWELVE-LEAD EKG:  Normal sinus rhythm, 69 beats per minute.   IMPRESSION:  1. Palpitations.  Patient asymptomatic.  She has had them in the past,      does well on the [place], not really clear in the dose though.  Last      Holter monitor showed rare premature ventricular contractions and      premature atrial contractions; this was back in May 2008.  I would      keep on same regimen.  2. Hypertension with good control.  Followup per Dr. Drue Novel.  3.  Dyslipidemia.  Again, being followed by Dr. Drue Novel.  Her last LDL that      I have was excellent at 69.  Again, I will defer to Dr. [name].   I would not set a definite followup unless her symptoms change or  worsen.  She seems comfortable with this.     Pricilla Riffle, MD, [facility]  Electronically Signed    PVR/MedQ  DD: 08/31/2007  DT: 08/31/2007  Job #: [id number]   cc:   [name], MD

## 2010-09-01 NOTE — Assessment & Plan Note (Signed)
[name] is back in today and she states my back has been much  better, on October 14th it started feeling a lot better. She states  that she does continue to use her TENS, but is not taking as much  medication and she is not really even using the Flexeril at this point.   She underwent a medial branch block of her lumbar spine on 01/02/2007 by  Dr. Jess [name], and her pain scores have significantly improved since the  block. In clinic the month before, her pain scores were 7s and 8s, today  she is down to a 4 or 5 on a scale of 10, 4 in clinic at this time.   The pain is located today in the left low back region. It is described  as constant, aching, but not as intense as it had been. She does have  bilateral shoulder pain and right knee pain. She is status post right  knee replacement. She also complains of some hand osteoarthritis as well  today. The pain in the DIP of her right hand and somewhat at the [facility]  joint in the left hand.   The pain is typically worse with activities. It improves with rest,  pacing her activities, medications, TENS unit and injections.   She reports good relief with her current medications.   Medications prescribed this by clinic include: Ultracet 1 t.i.d. to  q.i.d. p.r.n. She states that she did not need to refill this  prescription last month. She takes it maybe once or twice a day now. She  does take Neurontin 100 mg about three times daily. She uses Lidoderm on  a p.r.n. basis and Flexeril. She had been on 5 mg up to 2 to 3 times a  day, however since the injections, she is really not using Flexeril at  all. However, when she was in more pain, she found it to somewhat  helpful.   Regarding her mobility, she is able to walk 20 to 30 minutes at a time.  She is independent with her self-care. She was able to have a 77th  birthday party. She invited 18 people to her home and was able to manage  that.   Her review of systems is otherwise  non-contributory. At this time, she  does admit to some anxiety and depression, but denies suicidal  ideations.   Past medical, social, and family history is otherwise unchanged. She  will be following up with Dr. Drue [name] for her medical problems. She saw him  last week and had some blood drawn. Apparently, there are some nodules  on her lung that will require x-rays every six months.   PHYSICAL EXAMINATION:  VITAL SIGNS:  Blood pressure 121/73, pulse 86,  respirations 18, 96% saturated on room.  GENERAL:  She is well developed, well nourished elderly female who  appears her stated age. She is oriented x3. Speech is clear. Affect is  bright. She is alert, cooperative, and pleasant. She follows commands  without any difficulty.   Transitioning from sit to stand is done with ease. Her gait in the room  is normal. Tandem gait, she has a little bit of trouble with. However,  she performs an adequate Romberg's test.   Limitations are noted in lumbar motion in all plains. Reflexes are  present at the Achilles tendon, slightly diminished at the patellar  tendon. She has a knee replacement on the right with a well healed scar.   Motor strength in the lower  extremities is good without focal deficit. A  sensory exam was not assessed today.   Examination of her hands bilaterally, she has some minimal tenderness at  the carpal metacarpal joint on the left. It has some exquisite  tenderness, especially in the right DIP of her index finger.   IMPRESSION:  1. Right L5 root encroachment per MRI.  2. Left L4 and potentially left L3 root encroachment.  3. Overall improvement in right leg pain continues to have some      achiness around the hip replacement, however she will follow back      up with her orthopedist for further evaluation.  4. Symptoms of lumbar stenosis.  5. Hand osteoarthritis.   PLAN:  We will set her up for occupational therapy to educate her on  some joint protection techniques  and equipment evaluations. Consider  modality such as paraffin back to help alleviate some of her hand pain.   [name] has done quite well after medial branch block on 9/15. She  has reduced the amount of Ultracet that she takes. She no longer takes  the Flexeril. In fact, she did not even fill her prescription for  Ultracet last visit. She has been engage in activities socially, having  a birthday party last month inviting 18 people. She has been able to  stay quite active despite some significant physical impairment.   She has not displayed any aberrant behavior in our clinic. She takes her  medications appropriately. We will see her back in a month. If she  continues to do well, I anticipate we can stretch our visits out to  about every 2 or 3 months.           ______________________________  [name], M.D.     DMK/MedQ  D:  02/10/2007 10:44:38  T:  02/11/2007 03:40:57  Job #:  [id number]   cc:   Lauren Ora, MD  [phone] [address] [place], [place] [address]

## 2010-09-01 NOTE — Assessment & Plan Note (Signed)
[name] is a 77 year old pleasant female who has been seen in our  pain and rehabilitative clinic for chronic low back pain and right knee  pain.   She states that she has recently seen a orthopedist, who told her that  she had a metal allergy to her prosthesis. Apparently, she saw Dr. Freddi Che.   She states that her average pain is about a 4 on a scale of 10. The pain  is localized to the right leg laterally and posteriorly around in the  distal right thigh, and also pain goes down through the knee to the  middle of the right calf.   Average pain is about a 4 on a scale of 10. It is worse with activity.  It improves at rest, ice, medication, TENS unit injections, and she is  getting good relief with her current medications.   Medications prescribed by our clinic include:  1. Ultracet 3 to 4 daily on a p.r.n. basis.  2. Neurontin 100 mg 3 to 4 daily.  3. Lidoderm p.r.n.  4. Flexeril p.r.n.   She basically takes her 1 Ultracet in the morning and 1 Ultracet after  dinner in the evening, and she takes a Neurontin in the morning, and 1  Neurontin before bed.   Her functional status is unchanged. She is able to walk about 20 minutes  at a time. She is able to climb stairs. She drives. She is independent  with her self-care. She needs some assistance with higher level of  activities.   Review of systems and health and history form are non-contributory.   There are no changes in past medical, social, or family history since  last visit.   PHYSICAL EXAMINATION:  VITAL SIGNS:  Blood pressure 114/77, pulse 67,  respirations 18, 97% saturated on room air.  GENERAL:  She is a well developed elderly female who appears her stated  age. She is oriented x3. Speech is clear. Affect is bright and alert.  She is cooperative and pleasant. She follows commands without  difficulty.   Transitioning from sit to stand is done without difficulty. Romberg's  test and tandem gait are slightly  compromised.   Her reflexes are 2+ at the patellar tendons, 0 to 1 at the Achilles  tendons bilaterally. Motor strength is in the 5/5 range. No focal  deficits are appreciated. She has intact sensation.   She does complain of left hand pain. She has tenderness at the carpal  metacarpal joint on the left.   There is some tenderness in the right DIP of her index finger as well.   IMPRESSION:  1. Right hand pain, most likely OA.  2. Right L5 root encroachment per MRI.  3. Possible left L4 and potential left L3 root encroachment as well.      May contribute intermittently to her left leg pain.  4. Right knee pain, may have some achiness around the knee      replacement.  5. Lumbar stenosis.  6. Hand osteoarthritis.   PLAN:  I would like her to follow up with one of our local hand  specialists for further evaluation. She recently participated in some  physical therapy for education, joint protection techniques, and  modalities. She would like to see a specialist at this point since she  is still having some discomfort.   We will refill her medications today as well. She is given Ultracet 1  p.o. t.i.d. to q.i.d. p.r.n., number of 120 with  1 refill and Flexeril 5  mg 1 p.o. b.i.d. to t.i.d. for back spasm, number of 80 with 1 refill.   We will see her back in two months.           ______________________________  Brantley Stage, M.D.     DMK/MedQ  D:  03/10/2007 13:46:05  T:  03/11/2007 02:17:18  Job #:  [id number]

## 2010-09-01 NOTE — Assessment & Plan Note (Signed)
[facility]                            CARDIOLOGY OFFICE NOTE   NAME:[name], [name]                    MRN:          [id number]  DATE:08/22/2006                            DOB:          December 28, 1929    IDENTIFICATION:  [name] is a 77 year old woman who I saw back a year  ago.  She has a history of palpitations and a history of mitral valve  prolapse.   In the interval, she notes some dizziness, no real palpitations.  She  attributes it to her Tramadol and is being followed by Dr. Pamelia [name]  for back pain.   The patient has knee surgery x3 last summer, still having knee problems.  Note is being evaluated for kidney abnormality, has urology appointment  tomorrow.   CURRENT MEDICATIONS:  1. Cartia XT 180.  2. Iron daily.  3. Omega-3 gel 1200 b.i.d.  4. Celebrex 200 daily.  5. Xanax daily.  6. Zoloft 50 daily.  7. Lipitor 40 daily.  8. Percocet p.r.n.  9. Tramadol daily.  10.Ciprofloxacin for UTI.   PHYSICAL EXAMINATION:  GENERAL:  The patient is in no distress.  VITAL SIGNS:  Blood pressure on arrival 138/78, orthostatics include  blood pressure laying 153/76 and pulse 50; sitting 149/76 and pulse 51;  standing at zero minutes 143/77 and pulse 54, at two minutes 140/78 and  pulse 56, at five minutes 141/77 and pulse 57.  The patient is  asymptomatic.  NECK:  JVP is normal.  No bruits.  LUNGS:  Clear.  CARDIAC:  Regular rate and rhythm.  S1 S2.  No S3, S4, murmurs, or  clicks.  ABDOMEN:  Benign.  EXTREMITIES:  No edema.   IMPRESSION:  1. Palpations, relatively asymptomatic.  Note, she has some dizziness,      does not meet criteria for orthostasis today.  Heart rate is a      little on the low side and I will make sure she does not have      significant bradycardia and set her up for a 24-hour Holter      monitor.  We did an EKG today that showed a sinus bradycardia at 50      beats per minute with normal conduction intervals.   Tentatively, I      will set followup for a year.  Note, again we will need to follow      up closely with her blood pressure through her primary physician.  2. History of mitral valve prolapse.  Again, no murmur on exam today      if any must be mild or intermittent.     Pricilla Riffle, MD, [facility]  Electronically Signed    PVR/MedQ  DD: 08/22/2006  DT: 08/22/2006  Job #: [id number]   cc:   Lauren Ora, MD

## 2010-09-01 NOTE — Assessment & Plan Note (Signed)
HISTORY:  [name] is a 77 year old elderly female who is followed in  our [facility].  She is back in today for a recheck.  She recently has undergone medial branch blocks by Dr. Erick Colace on July 31, 2007.  She reports some improvement in her  overall low back pain.  Her average pain is about 6/10.  The pain is  described as constant, tingling, aching, sharp and stabbing in nature.  She is, however, complaining of bilateral leg pain, worse on the left,  which radiates down the back of the leg to the foot.  Her sleep is fair.  Pain is typically with walking, bending, sitting and standing and  improves with rest, heat, pacing her activities, a TENS unit and  medications.  She gets some relief with her current medications that she  is on.   MEDICATIONS:  1. Provided by this clinic include Ultracet between one and four      daily.  2. Neurontin (she has discontinued last month because of headaches;      however, may not have been related to headaches).  Apparently she      had an increase in blood pressure for which she received a new      blood pressure medicine per Dr. Willow [name].  3. She also uses Lidoderm on a p.r.n. basis.  4. Flexeril 5 mg on a p.r.n. basis.   FUNCTIONAL STATUS:  She currently is living independently.  Is able to  walk 25 minutes at a time.  She is independent with stairs.  Is able to  drive.  She is independent with her self-care.  She does have some help  coming in to help clean her home.  She is considering moving to a  retirement home next summer.   REVIEW OF SYSTEMS:  She does report some tingling down her left leg.  Occasional dizziness.  No other significant changes in the review of  systems is noted.   PAST MEDICAL HISTORY:  Elevated blood pressure.  The patient reports  that Dr. Drue [name] recorded her blood pressure at 198/102 and she was placed  on Micardis a couple of weeks ago.   SOCIAL/FAMILY HISTORY:  Otherwise  unchanged.   PHYSICAL EXAMINATION:  VITAL SIGNS:  Blood pressure 150/84, pulse 65,  respirations 20, 96% saturation on room air.  GENERAL:  A well-developed elderly female, who does not appear in any  distress.  She does seem somewhat tired today.  She is oriented x3.  NEUROLOGIC:  Speech is clear.  Affect is bright.  She is alert,  cooperative and pleasant.  She follows commands without difficulty.  On  transitioning from sitting to standing is done with ease today.  Her  gait is normal in the room.  She has mild limitations in lumbar motion.  Her reflexes are diminished overall and her tone is normal.  No clonus  was noted.  She has good strength in the lower extremities.  She has  some tenderness in the left lumbar paraspinal muscles.   IMPRESSION:  1. Cervical and lumbar spondylosis.  2. Status post laminectomy at L5-S1 remotely.  3. History of lumbar facet arthropathy.  4. Right knee pain, status post replacement.  5. Intermittent bilateral lower extremity pain, most likely      neuropathic in nature.  6. Bilateral shoulder pain.  7. Left wrist pain, secondary to degenerative joint disease, currently      followed by Dr. Katy [name].  Sypher for this.   PLAN:  She apparently had done better on Neurontin with respect to her  pain.  She would like to go back on it.  She will let us know if she has  any further problems with her headaches regarding increasing her  Neurontin again.  I discussed how to titrate it back up.  She will  continue to use Ultracet on a p.r.n. basis, not more than one to three  tab daily.  Flexeril she uses on a p.r.n. basis as well.  For nights  that she cannot sleep very well, she will use 5 mg in the evening.  Will  tend to avoid NSAIDs with her, with her history of one kidney.  We  discussed possibly having her undergo epidural steroid injection next  month.  Will reassess her next month to see how she is doing.  The  Neurontin may help her with the leg  pain; however, will reassess in  another four to six weeks.  She is currently stable on the above  medications.  She takes her medications as prescribed.  No aberrant  behavior has been observed.  She is able to maintain a relatively  functional life style despite multiple physical impairments.   I encouraged her to continue to maintain contact with her primary care  physician, Dr. Drue [name].           ______________________________  Brantley Stage, M.D.     DMK/MedQ  D:  08/23/2007 12:08:52  T:  08/23/2007 12:36:43  Job #:  [id number]   cc:   Lauren Ora, MD  [phone] [address] [place], [place] [address]

## 2010-09-01 NOTE — Assessment & Plan Note (Signed)
[name] is a 77 year old woman who lives independently.  She was last  seen in our [facility] on May 21, 2008.  She is  seen for chronic pain complaints related to low back pain and status  post right knee replacement.   She is back in today requesting refill on her Ultracet and Neurontin.  She is requesting a slight escalation in her Ultracet from 3 times a day  to 4 times a day.   She states that she recently moved into a new apartment and over the  last month has been quite active with the move.  Her low back is  bothering her a bit more than it has in previous months.  She initially  was inquiring about a lumbar epidural steroid injection; however, she  states that her leg pain really is not her main problem at this time.   Her average pain is about 6 on a scale of 10, described as constant,  sharp, and achy in nature.  Pain is worse with walking, bending,  prolonged sitting, and standing; improves with rest, heat, pacing her  activities, medications, TENS unit, and injections.  She gets fair  relief with current medications.   Medications from this clinic include:  1. Lidoderm on a p.r.n. basis.  2. Ultracet 1 p.o. t.i.d. p.r.n. back pain or knee pain.  3. Neurontin 100 mg 1 p.o. t.i.d.   FUNCTIONAL STATUS:  __________.   She is able to walk about 20 minutes at a time.  She is able to climb  stairs and drive.  She is independent with self-care.   REVIEW OF SYSTEMS:  Positive for slight weight loss of 5 pounds with  increased activity over the last month.   Past medical, social, family history otherwise unchanged other than that  previously noted.   PHYSICAL EXAMINATION:  Blood pressure is 124/79, pulse 75, respiration  18, 98% saturated on room air.  She is a well-developed, well-nourished elderly female who does not  appear in any distress.  She is oriented x3.  Speech is clear.  Affect is bright.  She is alert,  cooperative, and pleasant.   She follows commands without difficulty and  answers questions appropriately.  Cranial nerves and coordination are grossly intact.  Reflexes are  slightly diminished in the lower extremities.  No abnormal tone is  noted.  No clonus is noted.  No tremors are appreciated.  She has normal  muscle bulk in the lower extremities.  Transitioning from sitting to standing is done with ease.  Gait is not  antalgic.  Tandem gait, Romberg's test are performed adequately.   IMPRESSION:  1. Lumbar and cervical spondylosis.  2. Lumbago.  3. Status post lumbar laminectomy L5-S1 approximately 40 years ago.      History of facet arthropathy.  4. Status post total knee replacement on the right with chronic right      knee pain.  5. Bilateral lower extremity neuropathic pain.   PLAN:  She reports overall improvement with the use of the Neurontin.  I  will continue that at 100 mg up to 3 times a day.  We will refill her  Ultracet 1 p.o. up to 4 times a day this month, anticipate back down to  3 times a day next month.   She takes her medications as prescribed.  She does not exhibit any  aberrant behavior with their use and reports overall good relief with  their use.  She continues  to be a relatively active 77 year old woman  despite significant arthritis.  We will see her back in a month.           ______________________________  Brantley Stage, M.D.     DMK/MedQ  D:  06/19/2008 12:45:02  T:  06/20/2008 03:09:47  Job #:  [id number]

## 2010-09-01 NOTE — Assessment & Plan Note (Signed)
[name] is a pleasant 77 year old woman who is followed in our [facility] for multiple chronic pain complaints including  low back pain, leg pain, right knee pain, and she has a history of hand  arthritis as well for which she sees Dr. Teressa Senter for intermittently.   She was last seen by me on January 24, 2008, and in the interim she has  undergone epidural steroid injection on March 04, 2008.  She reported  1/10 pain after the injection and seems to have done fairly well with  the exception of some possible gastroenteritis up until she returned  from a vacation out to [place] on March 30, 2008.  She also has  discontinued Neurontin in the interim as well.   She is back in today and complains of increased low back pain and  bilateral leg pain.  Her average pain is about 6 or 7 on a scale of 10.  Pain is worse in the morning and improves throughout the day.  She gets  fair relief with current meds.  Pain is worse with walking, bending,  sitting, and standing, is described as constant, tingling, aching  predominately in the left low back radiating down the left leg to the  ankle, also some right leg pain is noted as well in the same  distribution.   FUNCTIONAL STATUS:  She is able to walk 10-15 minutes at a time.  She  does use a cane but can walk without a cane as well.  She is independent  with all of her self care and is in the midst of attempting to sell her  house at this time as well.   Denies problems controlling bowel or bladder.  Denies depression,  anxiety, or suicidal ideation.   Denies any problems with limb swelling or any other constitutional, GI,  GU, cardiorespiratory symptoms.   Past medical, social, and family history are otherwise unchanged from  last visit.   MEDICATIONS:  She takes through this clinic include Ultracet up to 3  times a day.  She had discontinued her Neurontin a couple of months ago.   PHYSICAL EXAMINATION:  VITAL  SIGNS:  On exam today, her blood pressure  is 115/60, pulse 75, respirations 18, and 97% saturated on room air.  GENERAL:  She is a well-developed, well-nourished elderly female who  appears her stated age.  She is oriented x3.  Her speech is clear.  Her  affect is bright.  She is alert, cooperative, and pleasant.  She answers  questions appropriately.   Her cranial nerves and coordination are intact.  Her reflexes are  slightly diminished in both upper and lower extremities.  No abnormal  tone is noted.  No clonus is noted.  Sensation is intact in both upper  and lower extremities.  Her motor strength is excellent, 5/5, in hip  flexors, knee extensors, dorsiflexors, and plantar flexors.   Straight leg raise is negative.   Internal and external rotation of the hip is not painful for her.   Knee replacement is noted on the right.   Flexion, extension, and lateral flexion are limited and mildly increased  her low back pain with execution of these activity.   IMPRESSION:  1. Lumbar and cervical spondylosis.  2. Lumbago.  3. Status post laminectomy at L5-S1 remotely.  4. History of facet arthropathy.  5. Right knee status post total knee replacement with chronic pain.  6. Bilateral lower extremity neuropathic pain which initially  responded to left epidural steroid injection; however, seems to      have returned over the last couple of weeks now.  7. Chronic left wrist pain secondary to degenerative joint disease      currently followed by Dr. Josephine Igo.   PLAN:  We refilled the following medications for her today, Ultracet 1  p.o. t.i.d. p.r.n. back or leg pain #90 and we will also refill her  Neurontin 100 mg one p.o. b.i.d. for 5 days and then increase to t.i.d.  #90 with refill.  I will see her back in a month.  She has been stable  on the above medications in the past.  She has not had any problems with  oversedation or constipation from the use.  She takes her  medications as  prescribed and no aberrant behavior has been observed.  In the past, she  has done well on these medications and has reported overall improvement  in function and pain .  I will see her back in a month.           ______________________________  Brantley Stage, M.D.     DMK/MedQ  D:  04/24/2008 11:37:27  T:  04/25/2008 03:24:05  Job #:  [id number]   cc:   [name], MD  [phone] [address] [place], [place] [address]

## 2010-09-01 NOTE — Assessment & Plan Note (Signed)
[name] is a 77 year old female who is followed in our [facility].  She is back in today for a brief recheck and a  refill of her medications.  She is being followed in our [facility] for multiple pain complaints including occasional  cervicalgia and lumbago.  She is known to have cervical as well as  lumbar spondylosis.  She is status post laminectomy at L5-S1 remotely  and has a history of facet arthropathy, and is status post right knee  replacement and has had a chronic pain in the knee since the  replacement, as well as prior to the replacement.   She also has intermittent shoulder pain, as well as left wrist pain  secondary to degenerative joint disease and is followed by Dr. Teressa Senter  for this particular problem.   She was last seen by me on September 18, 2007.   In the interim, she has had an epidural steroid injection which she  states has helped her leg pain significantly.  The injection was done on  July 31, 2007.  At that time, she had an L4-5 translaminar epidural  steroid injection with right paramedian approach.  This has helped her  significantly.   She states she just returned back from a trip out of state to a wedding.   This is a big trip for her, and overall, she feels she did fairly well  with her traveling.   Her average pain today is about a 2 in the right knee and 4/10 in the  back.  Pain in the back is worse with flexing past 45 degrees.  Pain  interferes significantly with activity.  Her sleep is, however, fairly  good.  Pain improves with rest, ice, pacing her activities, medication,  her TENS unit, and injections.   She gets fair relief with current meds, which are prescribed by this  clinic.   MEDICATIONS:  Which are prescribed by our clinic include:  1. Ultracet, she takes 1-2 tablets a day.  2. Neurontin 100 mg, she takes 2-3 tablets per day.  3. P.r.n. Lidoderm and Flexeril.  She occasionally takes as well  not      more than once a day, however.   FUNCTIONAL STATUS:  She is walking at least 30 minutes either with or  without a cane.  She is able to climb stairs, and she does drive.  She  is independent with her self-care.   REVIEW OF SYSTEMS:  Negative.  She denies depression, anxiety, or  suicidal ideation.  Denies problems controlling bowel or bladder.  Review of systems is negative.   Past medical, social, and family history unchanged since last visit.   PHYSICAL EXAMINATION:  VITAL SIGNS:  Blood pressure is 124/57, pulse 79,  respiration 18, and 96% saturated on room air.  GENERAL:  She is a well-developed, well-nourished elderly female who  appears her stated age and does not appear in any distress.  She is  oriented x3.  Speech is clear.  Affect is bright, alert, and  cooperative.  NEUROLOGIC:  Cranial nerves are grossly intact.  EXTREMITIES:  Motor strength is 5/5 without focal deficits in upper or  lower extremities.  There is no abnormal tone.  No clonus is noted.  Reflexes are symmetric and intact in the upper and lower extremities.  Right knee examination reveals full extension and flexion to at least 90  degrees.  No significant tenderness around the right knee today.  Lumbar  spine is without tenderness as well.  She has approximately 45 degrees  of forward flexion.  She reports pain at end range.  She has very  limited extension.   IMPRESSION:  1. Lumbar and cervical spondylosis.  2. Lumbago.  3. Status post laminectomy at L5-S1 remotely.  4. History of facet arthropathy.  5. Right knee pain, status post total knee replacement.  6. Intermediate bilateral lower extremity pain, which is most likely      neuropathic in nature.  7. Bilateral shoulder pain and intermittent left wrist pain secondary      to degenerative joint disease currently followed by Dr. Josephine Igo for this.   PLAN:  We will refill her Ultracet today 1 tablet p.o. b.i.d. to t.i.d.,  #90.   She has had some complaints of some dizziness recently.  We will  have her decrease from 3 Ultracet a day down to 2, and we will also  decrease her Neurontin from 3 times a day down to twice a day as well.  If she continues to have problems with dizziness, we would like her to  follow back up with Dr. Drue Novel.  Apparently, she also was on some blood  pressure medications, which have been changed possibly about 2 months  ago per [name]'s statements.  We will see her back in a month.  She  has been stable, otherwise, on medications, which are prescribed by this  clinic.           ______________________________  Brantley Stage, M.D.     DMK/MedQ  D:  10/25/2007 12:42:01  T:  10/26/2007 08:35:14  Job #:  [id number]

## 2010-09-01 NOTE — Assessment & Plan Note (Signed)
[name] is a 77 year old woman who is followed in our [facility] for multiple chronic pain complaints including  lumbago, right knee pain, and bilateral hand pain.   She is back in today for a brief recheck.  She is currently using  Lidoderm, Flexeril, Neurontin, and Ultracet to help manage her pain.   She states her average pain is about a 4 or 5 on a scale of 10 and  overall she has been fairly functional.  Pain is described as dull,  aching, and constant in these areas.  She states her knee is about a 2  on a scale of 10.  At this point, her low back is the biggest problem  for her, which is about a 4.   She reports overall good sleep.  She reports that her pain moderately  interferes with her activity level.   Pain is typically exacerbated by walking, bending, sitting, standing,  and improves with rest, ice, pacing her activities, medications, TENS  unit, and knee injections.   She reports fair-to-good relief with current medications that are  prescribed through this clinic.   Functional status is as follows.  She is able to walk 30 minutes at a  time.  She is able to climb stairs and drive.  She is independent with  her self care, which includes feeding, dressing, bathing, toileting, and  meal prep.  She requires some assistance with heavier household tasks.   Regarding bowel and bladder status, she reports occasional incontinence  may be one per month with respect to bladder and once every 2 months  with respect to bowel.  She has a history of her bladder being tacked up  remotely.   REVIEW OF SYSTEMS:  Positive for occasional reflux.   Past medical, social, or family history otherwise unchanged.   MEDICATIONS:  1. Ultracet 1-2 tablets per day.  2. Neurontin 100 mg twice a day.  3. Lidoderm p.r.n.  4. Flexeril p.r.n.   PHYSICAL EXAMINATION:  VITAL SIGNS:  Blood pressure is 117/71, pulse 82,  respiration 18, and 96% saturated on room air.  GENERAL:  She is a well-developed, well-nourished elderly female who  does not appear in any distress.  She is oriented x3.  Her speech is  clear.  Her affect is bright.  She is alert, cooperative, and pleasant.   Her cranial nerves are grossly intact.  Her coordination is grossly  intact.   Intact sensory exam in the upper extremities, reflexes are 2+ at the  biceps, triceps, brachioradialis bilaterally, 2+ at the patellar tendons  bilaterally, and 0 at the ankles bilaterally.   Motor strength is good in the upper extremities without focal deficits  and also 5/5 strength in the lower extremities without focal deficits.   She transitioned without difficulty from sitting to standing.  Her gait  in the room is nonantalgic.  She has difficulty with tandem gait.  She  is able to perform Romberg test adequately.   She has full range of motion in her shoulder and neck.  Limitations are  noted in lumbar range of motion, mild tenderness in the lumbar  paraspinal musculature is noted as well.  She has crepitus at the right  knee, but full range of motion at this joint.   IMPRESSION:  1. Lumbar and cervical spondylosis.  2. Lumbago.  3. Status post laminectomy at L5-S1 remotely.  4. History of facet arthropathy.  5. Right knee pain, status post total knee replacement.  6. Intermittent bilateral lower extremity pain, most likely      neuropathic in nature.  Overall, improved since recent epidural.  7. Bilateral shoulder pain and intermittent left wrist pain secondary      to degenerative joint disease, currently followed by Dr. Josephine Igo for this.   PLAN:  During history, the patient also mentioned some issues being  evaluated by Dr. Tenny Craw, cardiologist for possible irregular heart beat.  In the interim, we will hold her Lidoderm until this is further  evaluated and okayed by her cardiologist.   We will refill her Ultracet 1 p.o. t.i.d. p.r.n. neck or back pain, #90  with 3  refills and Neurontin 100 mg 1 p.o. b.i.d., #60, 3 refills.  We  will see her back in 2 months.  She has been stable on the above  medications.  She has not had any complaints of oversedation or  constipation.  She has remained quite functional and active.  She has  not displayed any aberrant behavior with her pain medications and uses  them appropriately.           ______________________________  Brantley Stage, M.D.     DMK/MedQ  D:  12/01/2007 12:47:00  T:  12/02/2007 03:43:53  Job #:  [id number]

## 2010-09-01 NOTE — Assessment & Plan Note (Signed)
[name] is a 77 year old pleasant female who is followed in our pain  and rehabilitative clinic for chronic low back pain, right knee pain,  and bilateral hand pain.   She is back in today, states she has been following up with Dr. Teressa Senter,  she received an injection into her left wrist.  The pain did not improve  with the injection.   She states the Lidoderm seems to help the best.  She does use a short  splint as well as long-arm splint at night to assist in managing her  pain in her wrist and thumb.   Her average pain is about a 6 to an 8 on a scale of 10.  Her low back  bothers her as well, especially with activities, walking, bending,  sitting, standing.  It improves with rest, heat, pacing her activities,  medications and the TENS unit.   Pain is moderately affecting relationships with others, significantly  affecting enjoyment of life, and also significantly interferes with  general activities.   She is able to walk about 30 minutes at a time.  She is able to climb  stairs and drive.   FUNCTIONAL STATUS:  She is independent with her self care.  She needs  some assistance with higher level household tasks.  She admits to  anxiety, but denies depression or suicidal ideations.   She continues to follow up with her pulmonary doctor, her renal doctor,  as well as Dr. Drue Novel, her primary care physician.  No other changes in  past medical, social, or family history since last visit.   MEDICATIONS PROVIDED BY OUR CLINIC:  1. Ultracet which she takes once in the morning and once after dinner.  2. Neurontin 100 mg 1 p.o. nightly.  3. She uses [employer] to affected areas on a p.r.n. basis as well.   PHYSICAL EXAMINATION:  Blood pressure is 136/79, pulse 66, respirations  18, 94% saturated on room air.  She is a well-developed female who appears her stated age.  She is  oriented x3.  Speech is clear and her affect is bright.  She is alert,  cooperative and pleasant.  She follows  commands without difficulty.  Transitioning from sitting to standing is done with ease.  Gait in the  room is stable, non antalgic.  Limitations noted in lumbar motion in all  planes.  She has between 30 and 40 degrees of internal and external rotation in  both hips without discomfort.  She has intact reflexes in the patellar  and Achilles tendons.  No abnormal tone is noted.  No clonus is noted.  Motor strength is 5/5.   IMPRESSION:  1. Lumbago.  2. Bilateral hand osteoarthritis.  3. __________ left L4 and potential left L3 neural root encroachment.      May be contributing to her intermittent left leg pain.  4. Right knee pain, status post replacement.  5. Lumbar stenosis.   PLAN:  She does not need refills on her Ultracet which she takes twice a  day, nor her Neurontin which she takes once a day, 100 mg at night.  She  continues to use [employer] on her back and her knee intermittently for  relief of pain.  She also is using built up handles in some of her  utensils in the kitchen to assist in helping manage the pain in her  hands.  She ices her knee intermittently as well for knee pain.  She has  recently acquired a  back brace which she is going to start using as well  to help modify her low back pain.  She does not need refills on any of  her medications at this time.  She is, overall, staying functional.  Goals here are to continue to have her remain functional, living  independently without undue side effects from pain medications.  She is  tolerating the above medications without any problems.  She denies over  sedation or constipation or balance problems.  We will see her back in 2  months.           ______________________________  Brantley Stage, M.D.     DMK/MedQ  D:  05/01/2007 14:12:03  T:  05/01/2007 18:53:20  Job #:  [id number]   cc:   [name], MD  [phone] [address] [place], [place] [address]

## 2010-09-01 NOTE — Telephone Encounter (Signed)
Entered in correct order for ECHO

## 2010-09-01 NOTE — Assessment & Plan Note (Signed)
[name] is a 77 year old woman who has recently moved to a  retirement facility.  She has been rather active, doing some activities  related to her recent move quite a bit of bending and stooping.  She was  last seen by me on June 19, 2008.   She states that in the last 2 weeks, she has developed significant  bilateral leg pain now.  She initially was having problems mostly with  the right leg; however, now the left leg is involved.  She has pain  going down the left leg through the buttock, posterior thigh, and into  the calf and foot.  She states she is miserable, she is sleeping poorly,  and average pain is now about a 7 on a scale of 10.  Today in clinic,  she states she is at about an 8.  Pain is described as sharp, constant,  stabbing, and aching in nature.  Pain is typically worse with standing  and bending, improves with rest.  Medication and TENS unit helps a  little bit.  She has had some relief with injections in the past,  however, has not had an epidural steroid injection now since March 04, 2008.   FUNCTIONAL STATUS:  She uses a cane to walk.  She can walk about 20  minutes at a time.  She is able to climb stairs and drive.  She is  independent with self-care.   Denies any problems with respect to review of systems today.   Medications prescribed throughout our clinic include;  1. Neurontin 100 mg 3 times a day.  2. Ultracet 1 p.o. q.i.d.  3. Flexeril 5 mg 1 p.o. b.i.d.  4. P.r.n. Lidoderm.   No changes in past medical, social, or family history since last visit.  She did state that she has an appointment coming up with Dr. Danielle [name].  She was referred by Dr. Drue [name] for evaluation.   PHYSICAL EXAMINATION:  Today, blood pressure is 123/78, pulse 66,  respirations 18, 96% saturated on room air.  She is well-developed, well-  nourished elderly female who appears somewhat more restless today.  She  is sitting and standing throughout our interview.   She is  oriented x3.  Speech is clear.  Affect is overall bright.  She is  alert, cooperative, and pleasant.  Follows commands without difficulty  and answers my questions appropriately.   Cranial nerves and coordination are grossly intact.  Her reflexes are 2+  at the patellar tendons, 1+ at the Achilles tendon.  Sensation decreased  in area over the right anterior thigh.   Motor strength is good in both lower extremities without obvious focal  deficit today.  Straight leg raise is negative.   She has limitations in lumbar motion in all planes.   IMPRESSION:  1. New left lower extremity symptoms consistent with neuropathic      pain/sciatica.  2. History of lumbar and cervical spondylosis.  3. Lumbago.  4. Status post lumbar laminectomy at L5-S1 approximately 4 years ago.      Last MRI showed evidence of right L5 root irritation, history of L3-      L4 disk protrusion and Facet disease and compression of left L3 and      L4 root.  Although MRI noted some left-sided involvement, she had      not had much problem with left lower extremity pain until the last      couple of weeks.   PLAN:  We will refill her Neurontin today at 100 mg 1-2 p.o. t.i.d.  She  does not need a refill on her Ultracet at this time.  She continues to  use Lidoderm and Flexeril on a p.r.n. basis as well.   I reviewed the risks and benefits of using Neurontin with her.  She has  been on it for a while.  I have increased her dose slightly in light of  her leg pain.   She has an appointment with Dr. Danielle [name] in the next week or so.  We will  also get her set up for an epidural steroid injection which is at her  request today.  We will see her back next month.           ______________________________  Brantley Stage, M.D.     DMK/MedQ  D:  07/31/2008 08:22:16  T:  07/31/2008 23:41:08  Job #:  [id number]   cc:   [name], MD  [phone] [address] [place], [place] [address]

## 2010-09-01 NOTE — Assessment & Plan Note (Signed)
REASON FOR VISIT:  Ms. Boak is a pleasant, 77 year old, widowed female  who is being followed in our Pain and Rehabilitative Clinic for multiple  pain complaints.  She is a patient of Dr. [name].  She is back in  today for refill of her medications.  She is being seen in our clinic  for bilateral shoulder pain, low back pain, leg pain and right knee  pain.  Her average pain is about a 6/10.  Her legs have significantly  improved.  Her main complaint now is just some left buttock pain and  some central low back pain.   She recently underwent medial branch block on January 02, 2007, and  notes an overall improvement.  The pain score prior to the block were in  the 7 and 8 range.  After the block, we were between 3 and 4  approximately 3 days after her block.   She reports overall good sleep.  Her pain is typically worse in the  morning and limits her from standing for much more than 20 minutes at a  time.  Pain is particularly worse with prolonged sitting, bending or  standing.  It improves with rest, ice, medications, TENS unit,  injections as well as pacing her activities.   She is able to climb stairs and drive.  She would like to be able to  stand longer to prepare better meals for herself.  She is otherwise  independent with her self-care and enjoys playing bridge with her  friends.  She admits to some depression.  She denies suicidal ideation.   Past medical history, social history and family history otherwise  noncontributory at this visit.   MEDICATIONS:  (Provided by the clinic)  1. Lidoderm on a p.r.n. basis.  2. Ultracet up to 3 tablets a day.  3. Neurontin up to 100 mg three to four times a day.   PHYSICAL EXAMINATION:  VITAL SIGNS:  Blood pressure 115/63, pulse 73,  respirations 16, O2 saturations 97% on room air.  GENERAL:  She is well-developed, well-nourished female who appears her  stated age.  Her speech is clear.  Her affect is bright.  She is alert,  cooperative and pleasant.  She is oriented x3.  She follows commands  without difficulty.  NEUROLOGIC:  She transitions from sitting to standing slowly.  Her gait  in the room is stable.  Tandem gait is performed with a little bit of  difficulty, however, Romberg test is performed adequately.  She has  limitations in forward flexion which is somewhat painful for her.  Extension does not bother her at this point.  Reflexes are 2+ at the  patellar tendon, 1+ at Achilles tendon.  No new sensory deficits are  appreciated.  Motor strength is in the 5/5 range without focal deficits.  She has some tenderness over the medial side of the right knee.   IMPRESSION:  1. Right L5 root encroachment for magnetic resonance imaging.  2. Left L4 and potentially left L3 root encroachment.  3. Overall improvement in right leg pain.  Left leg also improved.  4. Status post right knee replacement with some medial knee pain      persistent.  5. Symptoms of lumbar stenosis.   PLAN:  Will refill the above medications for her.  I encouraged her to  continue to use her TENS unit.  The Ultracet and Neurontin have not made  her sedated.  She does notice as she is not busy, she  is able to take a  nap easier now, however, she does not feel that she is impaired at all.  She also has been trialed in the last month on a little Flexeril.  She  finds it to be quite helpful with her low back.  She is on 5 mg up to  two times a day.   At this point, will hold off on any further injection.  Her pain scores  have improved.  Her leg pain currently is not as big an issue as it had  been.  She still has some residual low back pain which I believe may be  partly related to some discogenic issues.  She has been stable on the  above medications and has not had any significant side-effects from them  and is able to maintain a functional lifestyle independently and  engaging in social activities as well.  We will see her back in 1  month.           ______________________________  Brantley Stage, M.D.     DMK/MedQ  D:  01/13/2007 14:03:39  T:  01/13/2007 19:29:39  Job #:  [id number]   cc:   [name], MD  [phone] [place], [place] [address]

## 2010-09-01 NOTE — Op Note (Signed)
NAMERYNN, MARKIEWICZ             ACCOUNT NO.:  [id number]   MEDICAL RECORD NO.:  [id number]          PATIENT TYPE:  AMB   LOCATION:  [facility]                         [facility]ACILITY[facility]  [facility]   PHYSICIAN:  Excell Seltzer. Annabell Howells, M.D.    DATE OF BIRTH:  1930/03/11   DATE OF PROCEDURE:  09/08/2006  DATE OF DISCHARGE:                               OPERATIVE REPORT   PROCEDURE:  Cystoscopy with right retrograde pyelogram and right  ureteroscopy.   PREOPERATIVE DIAGNOSIS:  Right hydronephrosis.   POSTOPERATIVE DIAGNOSIS:  Right hydronephrosis secondary to ureteral  strictures/possible ureteral ligation.   SURGEON:  Excell Seltzer. Annabell Howells, M.D.   ANESTHESIA:  General.   SPECIMENS:  None.   COMPLICATIONS:  None.   INDICATIONS:  Mrs. Stull  is a 77 year old white female who was being  evaluated for pain complaint in the left back.  She had a CT scan as  part of the workup and was found to have right hydronephrosis with  marked renal atrophy, with dilation of the ureter into the pelvis.  A  previous CT scan in 2005 did not reveal this finding.  No obvious  evidence of stone or extrinsic lesions were noted on the CT scan and  since 2005, she had undergone a right ovarian cyst removal since the CT  in 2005.  She undergone a right ovarian cyst removal exploratory  laparotomy for adhesiolysis, but no other intra-abdominal surgery.  It  was felt that maybe a possible cause of her obstruction, but it was felt  that we need to rule out a neoplastic process   FINDINGS OF THE PROCEDURE:  The patient was taken operating room where a  general anesthetic was induced.  She was given Cipro preoperatively.  She was placed in the dorsolithotomy position.  Her perineum and  genitalia were prepped with Betadine solution.  The usual sterile  fashion.  Cystoscopy was performed using the 22-French scope and 12- and  70-degree lenses.  Examination revealed a normal urethra with the  exception of a couple of small benign polyps  at the bladder neck.  The  examination of bladder revealed some changes in the trigone consistent  with cystitis cystica.  No tumors or stones were seen.  Ureteral  orifices were normal anatomic position.   The right ureteral orifice was cannulated with a 5-French open-end  catheter and contrast was instilled in retrograde fashion.  This  demonstrated a blind ending ureteral segment, approximately 5-6 cm in  length.  No contrast was noted to flow into the dilated proximal ureter.   After the retrograde ureteroscopy was performed, the 6-French short  ureteroscope was advanced over a wire into the ureter and then up to the  level of obstruction.  At this point a small dimple could be identified  in the blind in the ureter that was likely the lumen.  However, there  was otherwise complete obliteration.  No evidence of neoplastic changes  or stones were seen.     After completion of ureteroscopy, the bladder was drained.  The patient  was taken down from lithotomy position.  Her anesthetic was  reversed and  she was moved recovery room in stable condition.   At this point she is asymptomatic because of atrophied hydronephrotic  kidney and no further intervention is indicated unless she  develops  pain or infection at which point a percutaneous approach to drain the  kidney would be indicated.  She will return to see me 2-3 weeks for  evaluation.      Excell Seltzer. Annabell Howells, M.D.  Electronically Signed     JJW/MEDQ  D:  09/08/2006  T:  09/08/2006  Job:  [id number]   cc:   [name], MD  [phone] [address] [place], [place] [address]   Barbette Hair. Arlyce Dice, MD,FACG  [address]  [place]  [place] [address]

## 2010-09-01 NOTE — Procedures (Signed)
NAMEZAYLYNN, [name]             ACCOUNT NO.:  [id number]   MEDICAL RECORD NO.:  [id number]          PATIENT TYPE:  REC   LOCATION:  [facility]                          FACILITY:  [facility]   PHYSICIAN:  Erick Colace, M.D.DATE OF BIRTH:  15-May-1929   DATE OF PROCEDURE:  DATE OF DISCHARGE:                               OPERATIVE REPORT   PROCEDURE PERFORMED:  Medial branch blocks under fluoroscopic guidance;  bilateral lumbar 5 dorsal ramus injection, bilateral lumbar 4 medial  branch block and bilateral lumbar 3 medial branch block under  fluoroscopic guidance.   PHYSICIAN:  Erick Colace, M.D.   INDICATIONS FOR THE PROCEDURE:  Lumbar facet medial pain.  The pain is  only partially responsive to medication management as well as other  conservative care, and interferes with ADLs and mobility.   DESCRIPTION OF THE PROCEDURE:  Informed consent was obtained after  describing the risks and benefits to the patient; these include  bleeding, bruising, infection, loss of bowel or bladder function, and/or  temporary or permanent paralysis.  She elected to proceed and has given  written consent.   The patient was placed prone on the fluoroscopy table; and a Betadine  prep and  sterile drape were done.  A 25-gauge inch-and-a-half needle  was used to anesthetize the skin and subcu tissue.  One percent  Lidocaine x2 cc was given.  Then a 22-gauge 3.5-inch spinal needle was  inserted.  First starting in the left S1 scaphocephalic sacral ala  junction bone contact was made confirmed with lateral imaging.  Omnipaque 180 x 0.5 mL demonstrated no intravascular uptake.  Then 0.5  mL of the solution containing 4 mg/cc of dexamethasone and 2 mL of 2%  lidocaine was injected.  Next, the left L5 scaphalocephalic transverse  process junction was targeted and bone was contact made.  This was  confirmed with lateral imaging.  Omnipaque 180 x 0.5 mL demonstrated no  intravascular uptake.  Then 0.5 mL  of the dexamethasone-lidocaine  solution was injected, but in the left L4 scaphalocephalic transverse  process junction was targeted and bone contact made.  This was confirmed  with lateral imaging with Omnipaque 180 x 0.5 mL demonstrating no  intravascular uptake, then 0.5 mL of the dexamethasone-lidocaine  solution was injected.  Preinjection pain level was 8/10 postinjection  it was 0/10.   The patient is to return to see Dr. Pamelia Hoit, who will make follow up  assessment and possibly order repeat medial branch blocks as needed.      Erick Colace, M.D.  Electronically Signed     AEK/MEDQ  D:  01/02/2007 14:16:04  T:  01/03/2007 03:01:30  Job:  [id number]   cc:   Brantley Stage, M.D.  Fax: [phone]   [name], MD  [phone] [address] [place], [place] [address]

## 2010-09-01 NOTE — Assessment & Plan Note (Signed)
[name] is a 77 year old woman who lives independently.  She has been  followed in our Pain and Rehabilitative Clinic for multiple chronic  pain, complaints including lumbago, status post knee replacement with  chronic pain.   She was last seen by me on April 24, 2008.  She is back in today for a  brief recheck.  She states she does not need any refills on her pain  medication at this time.   Her average pain is about 4 on a scale of 10, moderately interfering  with activity.  Sleep is fair.  Pain is worse with walking, bending, and  standing.  Improves with rest, heat, pacing her activities, medication,  and TENS unit.  Pain is localized to the low back and radiates down both  lower extremities posteriorly, and she does have bilateral knee pain as  well.  Pain is described as constant, sharp, tingling, aching, variably  in nature.   She gets fair relief with current meds.   Medications prescribed through this clinic include the following; p.r.n.  Lidoderm patches, Flexeril 5 mg few days to t.i.d. p.r.n., Ultracet 1  p.o. t.i.d., and Neurontin 100 mg 1 p.o. t.i.d.  However, she states  that she has been taking it only twice a day.   Functional status is as follows.  She is able to walk 20 minutes at a  time.  She can climb stairs and drives.  She is independent with self-  care.  She is planning to move [place], which is retirement  home on February 16.   She occasionally gets some tingling down her left leg; otherwise, denies  any problems with respect to her review of systems.   Reports that she recently had colonoscopy within the last month over at  [facility], Dr. Mechele Claude.   She has also has some basal-cell carcinoma removed from her face by Dr.  Irene Limbo about 1 week ago.   No other changes in the past medical, social, or family history.   PHYSICAL EXAMINATION:  Blood pressure is 122/75, pulse 74, respiration  18, and 96% saturated on room air.   She is a well developed, well  nourished elderly female, who has few small bandages on her face, one  over just laterally to her right naris and one just anterior to her  right ear.   She is oriented x3.  Her speech is clear.  Her affect is bright.  She is  alert, cooperative, and pleasant.  She follows commands without any  difficulty and answers questions appropriately.   Her cranial nerves and coordination are grossly intact.  Her reflexes  are diminished in the lower extremities.  Her motor strength is 5/5  without focal deficits.  She reports no sensory deficits with light  touch in the lower extremities today.   She has limitations in lumbar motion in all planes.  She has no pain  with internal or external rotation at the hips.  She does have some  tenderness along the right knee, especially along the right medial  aspect of the right knee along the joint line and into the proximal  aspect of the anterior tibia.  She has a well-healed anterior scar over  the right knee status post replacement.  She does have crepitus with  flexion and extension of the right knee joint.  She has some mild  tenderness on the left less so than on the right however.  No effusion  is noted  in either knee.  No instability is appreciated in either knee.   IMPRESSION:  1. Lumbar and cervical spondylosis.  2. Lumbago.  3. Status post laminectomy at L5-S1 remotely.  4. History of facet arthropathy.  5. Right knee status post total knee replacement with chronic right      knee pain.  6. Bilateral lower extremity neuropathic pain, which has responded      briefly in the past to a bit of epidural steroid injection.  7. Chronic left wrist pain secondary to degenerative joint disease.      It is currently followed by Dr. Teressa Senter, hand surgeon.   PLAN:  At the last month, I had written her Neurontin for her to take 1  p.o. b.i.d. for 5 days and increasing it to t.i.d.  However, she stayed  on a b.i.d.  dosing schedule.  Apparently, she misread the prescription  information.  She takes her medication currently at 9:15 a.m. and about  10:30 p.m.  We would like her to take this extra dose sometime after  lunch and may next month add one more dose if she seems to be tolerating  the medication without problems of oversedation or gait instability or  edema.   She does not need a refill on her Ultracet today.  She is tolerating  this well without problems of constipation, gait instability, and  dizziness.  I will not refill her Flexeril.  We will hold off on this  particular medication unless she has some acute muscle spasms that we  might need to address   She continues to use Lidoderm on a p.r.n. basis as well.   Ms. Tweten has appropriate pill counts with her Ultracet and has taken  this medication appropriately, and no evidence of aberrant behavior with  its use.  She reports, overall, fair relief with the use of the  medication.   We will see her back in 1 month, may consider titrating her Neurontin up  1 more pills to 100 mg 4 times a day at the next visit.           ______________________________  Brantley Stage, M.D.     DMK/MedQ  D:  05/22/2008 11:28:34  T:  05/23/2008 00:51:47  Job #:  [id number]

## 2010-09-01 NOTE — Procedures (Signed)
NAMEKERSTON, LANDECK NO.:  [id number]   MEDICAL RECORD NO.:  [id number]          PATIENT TYPE:  REC   LOCATION:  [facility]                          FACILITY:  [facility]   PHYSICIAN:  Erick Colace, M.D.DATE OF BIRTH:  [birth year]   DATE OF PROCEDURE:  DATE OF DISCHARGE:  07/31/2007                               OPERATIVE REPORT   PROCEDURE:  This is L4-5 translaminar lumbar epidural steroid injection,  right paramedian approach.   INDICATIONS:  Lumbar radiculitis, history of L5-S1 laminectomy in the  past, has had good results with prior L4-5 epidural last done on August  2008.  Pain is only partially responsive to medication management and  other conservative care and interferes with ADLs and mobility.   Informed consent was obtained after describing the risks and benefits of  the procedure with the patient.  These include bleeding, bruising,  infection, temporary or permanent paralysis.  She elects to proceed and  has given written consent.  The patient placed prone on the fluoroscopy  table.  Betadine prep, sterilely draped.  A 25-gauge instep needle was  used to anesthetize the skin and subcutaneous tissue, 1% lidocaine x2  mL, then an 18-gauge [place] needle was inserted under fluoroscopic  guidance targeting the inferior lamina of right L4.  Bone contact made,  needle redirected inferiorly and medially, and loss-of-resistance  technique employed using 50% saline water mix.  Under lateral imaging,  positive loss of resistance was obtained, confirmed with Omnipaque 180  under live fluoro and demonstrated good epidural spread followed by  injection of 2 mL of 1% lidocaine plus 2 mL of 40 mg/mL Depo-Medrol.  The patient tolerated the procedure well.  Pre and post-injection vitals  were stable.  Post-injection instructions were given.  She will follow  up with Dr. Pamelia Hoit in 1 month.      Erick Colace, M.D.  Electronically Signed     AEK/MEDQ  D:   09/28/2007 11:41:41  T:  09/29/2007 01:37:36  Job:  [id number]

## 2010-09-01 NOTE — Assessment & Plan Note (Signed)
HISTORY:  The patient is a 77 year old widowed white female who is being  seen in our pain and rehabilitative clinic for multiple pain complaints  including bilateral shoulder pain, low back pain, leg pain, right knee  pain, and some new bilateral intermittent flank pain.  She states her  average pain in all these areas is about a 5 on a scale of 10.  When she  is specifically asked about various areas, she states her low back is  about a 7 on a scale of 10, and her legs are about a 3 on a scale of 10.   She recently underwent epidural steroid injection and has noted overall  improvement in her legs; however, she continues to have low back pain  complaints as her chief complaint today.   She states that the back interferes quite a bit with her activity level  and also in her enjoyment of life.  She sleeps well; however, she  recently had a TENS unit evaluation and she found the TENS unit to be  quite helpful and she would like to continue to use this unit to help  manage her pain.   She gets fair to good relief with the current medications that she is  on.  She is able to walk about 25 minutes at a time.  She is able to  climb stairs.  She is independent with her self-care and she does have  some help coming into the home to help with cleaning.  She denies any  problems with suicidal ideation.  Admits to some depression, trouble  walking.  She reports some dizziness when she turns quickly and some  intermittent abdominal pain.  I referred back to her primary care  physician for this issue.   Past medical, social and family history otherwise unchanged since last  visit.   CURRENT MEDICATIONS:  1. Lidoderm 5% up to three patches, 12 hours on and 12 hours off.  2. Ultracet one p.o. t.i.d.  3. Neurontin 100 mg three to four times a day.   PHYSICAL EXAMINATION:  On exam today, her blood pressure is 142/83,  pulse 66, respirations 18, 96% saturate on room air.  She is a well-  developed, well-nourished female who appears her stated age.  She does  not appear in any distress.  She is oriented x 3.  Her speech is clear.  Her affect is bright.  She is alert, cooperative and pleasant.  She  follows commands without difficulty.  She transitions from sitting to  standing easily.  Gait in the room is nonantalgic.  Romberg's test is  performed adequately.  She has just a small amount of trouble with  tandem gait.  She reports discomfort with forward flexion as well as  mildly with extension.  Pinprick sensation down her right flank and left  flank reveals decreased sensation on the right at approximately T11.  Her motor strength; however, is quite good in upper and lower  extremities.  Sensation otherwise is grossly intact.  Reflexes are  symmetric and intact,  2+ at patellar and Achilles tendons.  No abnormal tone is appreciated.   IMPRESSION:  1. Right L5 root encroachment per magnetic resonance imaging.  2. Left L4 and potentially left L3 root encroachment.  3. History of L5-S1 decompression laminectomy and diskectomy.  4. Bilateral lower extremity pain, appears to be overall improved      after epidural injection down to a 3 on a scale of 10.  5.  Status post right knee replacement.  6. Chronic low back pain, 7 on a scale of 10 currently.  7. Some symptoms of lumbar stenosis.   PLAN:  With respect to her treatment plan, we will continue Ultracet,  Neurontin t.i.d., and Lidoderm.  She has done quite well TENS unit.  We  will have her continue to use that.  She does use topical cream on her  shoulders intermittently.  We will hold off on further epidural  injections at this point, may consider medial branch block to help with  the low back pain now that the leg pain seems to be somewhat overall  improved.  She would like to pursue this after being given multiple  options with respect to her back.  We will have her follow up with Dr.  Wynn Banker for medial branch  block trial and I will see her back in a  month.           ______________________________  Brantley Stage, M.D.     DMK/MedQ  D:  12/15/2006 13:27:14  T:  12/16/2006 10:20:58  Job #:  [id number]

## 2010-09-01 NOTE — Telephone Encounter (Signed)
Removed order

## 2010-09-01 NOTE — Assessment & Plan Note (Signed)
[name] is a 77 -year-old pleasant female who lives  independently, is followed in our pain and rehabilitative clinic for  multiple chronic pain complaints which include lumbago, right knee pain  status post right knee replacement, bilateral hand pain worse in the  left wrist, and her chief complaint today is increased bilateral  shoulder pain.   She states the pain in her left hand is fairly constant.  It bothers her  at night as well as during the day.  She has had two injections through  Dr. Stark [name] office which have really not given her much relief.   She states her average pain is between a 5-6 on a scale of 10.  The pain  is described as sharp, dull, stabbing in nature.  Her pain interferes  moderately to occasionally significantly with her activity level.  Back  is worse in the morning for her.  Her right knee aches more toward the  end of the day and her left wrist aches a good part of the day.  It  seems to be a little bit worse with activity.  It seems activity may  provoke most of her pain, although she does have some pain even with  rest.  She uses moist heat and tries to pace her activities.  Intermittently uses a TENS unit and also the medications to help control  her pain.  She reports between fair a nd good relief most of the time  with these interventions.   MEDICATIONS:  Provided by our clinic include:  1. Ultracet which she is able to take between 1-4 times a day.  She      typically averages 1-2 a day.  2. Neurontin had been prescribed up to 3-4 times a day, however, she      is only taking 100 mg at night currently.  3. She also uses Lidoderm on a p.r.n. basis and occasionally takes a      Flexeril but takes this rarely.   FUNCTIONAL STATUS:  She lives independently and is able to walk between  30-40 minutes.  She is able to climb stairs and she drives.  She is  independent with her self care and also some higher level activities.  She is active  socially and plays bridge with a group of other women.  She admits to some occasional muscle spasm in her back.  She denies  numbness, tingling, or weakness.  She denies depression, anxiety, or  suicidal ideation.  She apparently has been weaned down off of her  antidepressant and has been switched to Xanax instead and she feels  overall better from this medication change.   PAST MEDICAL HISTORY:  Otherwise no other changes.   SOCIAL HISTORY:  Unchanged from our last visit.   FAMILY HISTORY:  Unchanged from our last visit.   She was last seen May 01, 2007.   PHYSICAL EXAMINATION:  VITAL SIGNS:  Her blood pressure is 125/77.  She  has a pulse of 6 8, respirations 18, 97% saturated on room air.  GENERAL:  She is an elderly female who appears her stated age and does  not appear in any distress.  She is well developed and well nourished.  MENTAL STATUS:  She is oriented x3 and her speech is clear.  Her affect  is bright.  She is alert.  She is cooperative and pleasant and she  follows commands easily.  MUSCULOSKELETAL:  She transitions without difficulty from sitting to  standing.  Her gait in the room is stable and non-antalgic.  Tandem gait  and Romberg test give her some trouble.  She has limitations in cervical  range of motion mildly as well as lumbar range.  She has full shoulder  range.  NEUROLOGIC:  Reveals intact reflexes which are symmetric in the upper  and lower extremities.  There is no abnormal tone.  No clonus is noted.  She has diminished sensation over the left region of the deltoid  otherwise intact in both upper extremities.  Her motor strength is quite  good without focal deficit.  She has some tenderness along the dorsal  aspect of her right wrist on the radial side.   IMPRESSION:  1. Cervical and lumbar spondylosis.  2. Status post laminectomy L5-S1 remotely.  3. History of facet arthropathy.  4. Right knee pain, status post replacement.  5. Bilateral  shoulder pain.  6. Left wrist pain most likely degenerative in nature, currently      followed by Dr. Teressa [name] for this, however, given that she has      continued pain in the evening as well as throughout the day and      diminished sensation over the left deltoid in the C5-6 dermatome,      may consider neuropathic element to this pain as well.   Would like her to increase her Neurontin slightly over the next few  weeks.  She currently takes just 100 mg at night, would like to see her  slowly increase it to up to 3-4 times a day and next month I may give  her a little bigger dose in the evening to see if we can improve her  pain at night.   No prescriptions were written for her today.  She does not need refills  on Ultracet, Neurontin, Lidoderm, nor Flexeril.   She has taken her medications as prescribed.  She does not display  aberrant behavior with their use.  She is monitored for problems with  over sedation or constipation and these have not been an issue for her  so far.   I encouraged her to continue to maintain contact with her primary care  doctor and I will see her back in a month.   She is able to maintain an independent lifestyle and remains relatively  active despite multiple pain complaints.           ______________________________  [name], M.D.     DMK/MedQ  D:  06/30/2007 12:21:06  T:  06/30/2007 19:21:32  Job #:  [id number]   cc:   Lauren Ora, MD  [phone] [address] [place], [place] [address]

## 2010-09-01 NOTE — Assessment & Plan Note (Signed)
[name] is a 77 year old white widowed female who is the patient of  Dr. Willow [name].  She was last seen on July 28, 2006.  She is being seen  in our pain and rehabilitative clinic for chronic low back pain, and  bilateral lower extremity pain.  At the last clinic visit an MRI was  ordered to evaluate her lumbar spine is a source for her bilateral leg  pain; and I received a call from Dr. Benard [name] stating that the MRI showed  massive hydronephrosis on the right, and hydroureter recommending  further evaluation.  A call was placed over to Dr. Leta [name] office, and I  spoke with his nurse, [name], who notified Dr. Drue [name].   [name] states that she has been referred to her urologist and is  currently undergoing a workup to determine the cause for her right-sided  hydronephrosis.  I do not have any notes regarding this current workup.  She is back in today stating that her pain is about an 8 to a 6 on a  scale of 10; predominately in the left lobe, back, and buttocks region,  down the posterior thigh and the left leg, and down the right leg and  left leg in the calf to his feet.  She is able to walk between 30 and 40  minutes of the time.  She describes her pain as dull and aching in  nature, worse with walking, bending, standing, and occasionally sitting;  improves with rest, getting some relief with the current meds that have  been prescribed.   She is able to climb stairs.  She is able to drive.  She admits to some  depression and anxiety.  Denies suicidal ideation.  Admits to some bowel  and bladder problems.   SOCIAL AND FAMILY HISTORY:  Otherwise unchanged.   MEDICATIONS CURRENTLY PRESCRIBED FROM THIS CLINIC:  1. Lidoderm on the p.r.n. basis.  2. Ultracet 1-2 p.o. q.p.m.   PHYSICAL EXAMINATION:  VITAL SIGNS:  On exam today, her blood pressure  is 128/78, pulse 64, respirations 16, O2 saturation 97% on room air.  GENERAL:  She is a well-developed, well-nourished female who appears  her  stated age.  NEUROLOGIC:  Her affect is bright and alert.  She is cooperative and  pleasant.  She is oriented times three.  Her speech is clear.  She  follows commands without any problems.  She transitions from sit-to-  stand somewhat stiffly.  Her gait in the room is stable, however, she is  able to perform Robert's test adequately.  Reflexes are 2+ of the  patellar tendons, 1+ at the Achilles' tendon on the right, and 2+ at the  Achilles' tendon on the left.  Decreased sensation was noted in the L2-3  dermatome.  Motor strength, however, for the most part is good in both  lower extremities.  EHL slightly weaker in both lower extremities.   MRI done on August 01, 2006, of the lumbar spine without contrast showed  an L3 for left-ward protrusion extending into the foramina with facette  arthropathy left L4 and potentially left L3 root encroachment seen, as  well as right L5 nerve root encroachment.  Right L4 nerve root exits  without difficulty.  She has post surgical changes at L5 and S1 with the  compressive laminectomy and diskectomy.  The results of this was  reviewed with her as well today.   IMPRESSION:  1. Right L5 root encroachment.  2. Left L4 and potentially  left L3 root encroachment.  3. History of L5-S1 the compressive laminectomy and diskectomy.  4. Bilateral lower extremity pain.  5. Chronic low back pain.  6. Occasional symptoms of stenosis.   PLAN:  I encouraged her to follow up with her family doctor as well as  urologist regarding her hydronephrosis.  For her leg pain and low back  pain.  We will continue her on some Ultracet 1 p.o.  t.i.d. #90 and will  trial her on Neurontin starting with 100 mg at night for one week, then  b.i.d. x1 week, and then t.i.d.  She will let me know she  has any problems regarding this.  I reviewed the risk and benefits of  these medications with her.  She would like to trial them.  I will see  her back in the month.            ______________________________  [name], M.D.     DMK/MedQ  D:  08/25/2006 13:43:25  T:  08/25/2006 14:47:35  Job #:  [id number]   cc:   Lauren Ora, MD  [phone] [address] [place], [place] [address]

## 2010-09-01 NOTE — Procedures (Signed)
NAMEHILDRETH, ORSAK NO.:  [id number]   MEDICAL RECORD NO.:  [id number]          PATIENT TYPE:  REC   LOCATION:  [facility]                          FACILITY:  [facility]   PHYSICIAN:  Erick Colace, M.D.DATE OF BIRTH:  1929/05/13   DATE OF PROCEDURE:  12/05/2006  DATE OF DISCHARGE:                               OPERATIVE REPORT   PROCEDURE:  L4-5 translaminar epidural steroid injection.   INDICATION:  Lower extremity pain due to lumbar radiculitis.   The pain is only partially responsive to medication management.   Informed consent was obtained after describing risks and benefits of the  procedure to patient.  These include bleeding, bruising, infection.  She  elects to proceed and has given written consent.  Patient states her  pain is averaging 6/10.   Patient placed prone on the fluoroscopy table, Betadine prep, sterile  drape.  A 25-gauge 1-1/2-inch needle was used to anesthetize the skin  and subcu tissue.  Lidocaine 1% x2 mL.  Then an 18 gauge 3-1/2-inch  Hustead needle was inserted under fluoroscopic guidance.  Inferior  lamina of L4.  Contacted needle, redirected inferiorly followed by loss  of resistance technique which was obtained.  A small amount of heme.  Needle was slightly withdrawn and then Omnipaque 180 x1 mL demonstrated  good epidural spread, no further heme and then a solution containing 1.5  mL of 40 mg/mL Depo-Medrol plus 1.5 mL of 1% lidocaine was injected.  Patient tolerated the procedure well.  Pre/post injection vitals stable.  Post injection instructions given.  Post injection pain level 0/10.  Patient ambulated without difficulty and will be seen again in 1 month.      Erick Colace, M.D.  Electronically Signed     AEK/MEDQ  D:  12/05/2006 09:06:19  T:  12/05/2006 11:59:45  Job:  [id number]

## 2010-09-01 NOTE — Assessment & Plan Note (Signed)
[name] follows up today.  I performed a left L4-L5 transforaminal  lumbar epidural steroid injection on March 04, 2008.  She had marked  relief in the left lower extremity pain.  She went from a 5/10 to 0/10  and this lasted for about 2 days when her pain went up to 1/10.  She  did, however, complain of some generalized weakness starting about 2  days post injection.  She has had no falls.  She did have some cramping  and diarrhea.  She does not have any fever.  She has not taken any new  medicines nor has she abruptly stopped any medications.   She has not had any problems with self care but because she felt a bit  unsteady, she has been using a cane.   She has not had any episodes like this with prior injections.  The  previous injection was a translaminar injection.  We used 10 mg of  dexamethasone, which is equivalent to 40 of Depo-Medrol compared to the  80 of Depo-Medrol used during the last injection.   PHYSICAL EXAMINATION:  VITAL SIGNS:  Blood pressure 126/61, pulse 80,  respiratory rate 18, and O2 sat 97% on room air.  GENERAL:  In no acute distress.  Mood and affect appropriate.  MUSCULOSKELETAL:  Her lower extremity strength is 5/5 bilateral hip  flexors, knee extensors, and ankle dorsiflexors.  Her gait is normal.  She has a negative Romberg.  She has full range of motion in the lower  extremities.  She has good deep tendon reflexes.  Upper extremity  strength is normal.  Neck range of motion is normal.  Her back range of  motion is 75% forward flexion and 50% of extension.   IMPRESSION:  1. Lumbosacral neuritis.  Radiculitis improved after transforaminal      injection.  She had better results with the transforaminal approach      rather than translaminar approach.  2. Generalized weakness.  No focal neurologic deficits.  She does not      look acutely ill.  I question whether she may have had a      gastrointestinal virus given she was having some abdominal  cramping      and frequent bowel movements.  I have instructed her, if this      persists, to follow up with her primary physician.  She states that      her gastrointestinal symptoms have improved.  She still feels a bit      weak though.   I did check her injection site.  There was no evidence of infection.  I  do not think that this represents a complication from her injection.  She will follow with Dr. Pamelia Hoit next visit and also follow up with  Dr. Drue Novel if need be.      Erick Colace, M.D.  Electronically Signed     AEK/MedQ  D:  03/08/2008 14:59:21  T:  03/09/2008 02:52:06  Job #:  [id number]   cc:   Brantley Stage, M.D.  Fax: [phone]   [name], MD  [phone] [address] [place], [place] [address]

## 2010-09-01 NOTE — Assessment & Plan Note (Signed)
[name] is a 77 year old elderly female who is followed in  our pain and rehabilitative clinic.  She is back in today for a brief  recheck and does not need refill on any of her medications at this time.  She is requesting an epidural steroid injection.  She has recently  undergone some medial branch blocks on July 31, 2007, which improved  her back pain overall.  However, she has been having quite a bit of leg  pain, especially on the right recently.   She is known to have a knee replacement on that side but also has  significant lumbar spondylosis and is status post a laminectomy  remotely.   Pain radiates from the low back down to the right knee and the area does  throb.  She also has bilateral lower extremity pain which radiates down  the back of both legs to the feet.  The pain is constant in this area,  especially in the low back and buttock region, and she gets tingling in  both lower extremities and the pain can be stabbing in nature.   Her average pain is about 5 on a scale of 10.  Currently in the clinic  it is a 4.  She states that it interferes significantly with her  activity level.  Pain is worse in the morning and worse with walking,  bending, sitting.  Better with rest, ice, pacing her activities,  medications and the TENS unit.  She gets fair relief with current  medications.   FUNCTIONAL STATUS:  She is able to walk about 30 minutes at a time.  She  is able to climb stairs and drive.  She does use a cane for longer  distances and when she first wakes up in the morning.  She is  independent with her self-care and is currently living independently.   REVIEW OF SYSTEMS:  Denies depression/anxiety.  Denies suicidal  ideation.  Has intermittent problems with bowel and bladder.  Does  report today some right foot itching.   SOCIAL AND FAMILY HISTORY:  Are otherwise unchanged.   MEDICATIONS:  Which are prescribed by this clinic include:  1. Ultracet which  she takes currently one tablet daily.  2. Neurontin 100 mg which she takes three times a day.  3. Lidoderm 5% p.r.n.  4. Flexeril 5 mg once a day at night.   EXAMINATION:  Blood pressure is 114/71, pulse 68, respirations 20, 96%  saturated on room air.   She is a well-developed elderly female who does not appear in any  distress.  She is oriented x3.  Her speech is clear, her affect is  bright. She is alert, cooperative, and pleasant and she follows commands  without any difficulty.   She transitions easily from sitting to standing.  Her gait is stable in  the room.  Tandem gait, Romberg's test are performed adequately.   Her reflexes are diminished at the right knee, 1+ at the left knee, and  1+ at each ankle.  Her strength is good in both lower extremities, 5/5  at hip flexors, knee extensors, dorsiflexors, plantar flexors, EHL.   IMPRESSION:  1. Cervical and lumbar spondylosis.  2. Lumbago.  3. Status post laminectomy at L5-S1 remotely.  4. History of facet arthropathy.  5. Right knee pain status post replacement.  6. Intermittent bilateral lower extremity pain which is most likely      neuropathic in nature.  7. Bilateral shoulder pain intermittently and intermittent  left wrist      pain secondary to degenerative joint disease followed by Dr. Josephine Igo for this.   PLAN:  She currently is only taking one Ultracet a day.  I suggested she  take it a little more often, up to two or three times, even four times a  day if she has significant pain which is interfering with her function.   She does not need refills on any of her medications at this time.  She  is requesting to be set up for an epidural steroid injection with Dr.  Wynn Banker again.  We will go ahead and do that for her, see if we can  help her out with some of this bilateral lower extremity pain that she  experiences.  Will see her back in a month.   She does plan to be traveling over the next month as  well.           ______________________________  Brantley Stage, M.D.     DMK/MedQ  D:  09/18/2007 13:15:56  T:  09/18/2007 13:53:22  Job #:  [id number]

## 2010-09-01 NOTE — Assessment & Plan Note (Signed)
[name] is a 77 year old pleasant female who is followed in our Pain  and Rehabilitation Clinic for chronic low back pain, right knee pain and  bilateral thumb pain.   She recently followed up with Dr. Teressa Senter who confirmed she has  degenerative changes in her wrist as well as her [facility] joint.  Apparently  he felt she had [place] stage 4 CMC arthrosis.  He recommended for  increase pain a thumb injection.   Today she is requesting another back injection however on further  discussion she states her back is about a 2 or 3 on a scale of 10 and  actually overall is doing fairly well with respect to her back.  She  does get some pain mainly when she is flexing forward, making the bed,  unloading the dishwasher and doing laundry when she is in more of a  forward flex position.  Extension really does not bother her.   Right knee pain is between a 5 and a 6 on a scale of 10.  Her thumb pain  is about a 7 or an 8 which is her biggest problem today.   Her sleep is good.  She is getting good relief with the medications  prescribed by our clinic.  Pain is typically worse with walking,  bending, activity, standing.  Improves with rest, heat, medications,  TENS unit and injections.   Medications provided by our clinic include Ultracet, she takes 1 in the  morning and 1 in the afternoon.  Neurontin 100 mg which she takes once  at night 100 mg tablet.  She uses Lidoderm as needed and Flexeril as  needed not more than once a day.   She is able to walk between 20 and 30 minutes at a time.  She is  independent in her ability to climb stairs and drive.  She is  independent with her self care and is able to perform higher level  household tasks including grocery shopping and mail prep and she also  engages in social activity such as playing bridge and she complains  bitterly that her thumb pain is making it difficult for her to hold her  cards.   She admits to some depression but denies suicidal  ideation.  She has  some intermittent abdominal complaints which she follows up with Dr.  [name] for.   Past medical, social, family history otherwise unchanged.  She is on an  aspirin a day.   EXAMINATION:  Her blood pressure is 131/74, pulse 70, respirations 16,  94% saturated on room air.  She is a well developed, well nourished  female who appears her stated age and does not appear in any distress.  She is oriented x 3.  Speech is clear, affect is bright.  She is alert,  cooperative and pleasant.  Follows commands without any difficulty.  Transitioning from sitting to standing is done quite easily.  No pain  behaviors are displayed in clinic.  She does however appear a little  stiff when she first gets up however her gait deepens out quickly.  Gait  is stable.  She is able to perform tandem gait as well as Romberg's test  adequately.  Lumbar motion is actually quite good with respect to  forward flexion and extension today.  She really does not complain of  pain with extension.  She has some discomfort at end range with forward  flexion.  Lateral flexion does not bother her as well.  Her reflexes  are  symmetric and intact at the patellar and Achilles tendon.  No abnormal  tone is noted.  Motor strength is 5/5 at hip flexors, knee extensors,  dorsiflexors and plantar flexors.  She does continue to have tenderness  over her [facility] joint bilaterally.  A little bit of fluid is noted possibly  as well.   IMPRESSION:  1. Left CMC hand OA.  Plan:  We will have her follow-up with Dr.      Teressa Senter.  Apparently they had planned a possible injection for her.  2. Right L5 root encroachment per MRI.  3. Possible left L4 and potential left L3 root encroachment which is      probably contributing somewhat to her intermittent left leg pain.  4. Right knee pain.  Some achiness especially around the knee      replacement.  5. Lumbar stenosis.   PLAN:  She does not need a refill on her Ultracet,  Neurontin or Lidoderm  today.  She has been using these medications sparingly and  appropriately.  Ultracet not more than twice a day, Neurontin once a day  at night and Lidoderm on an as needed basis.  She is using her left hand  splint at night which does provide some stability around the [facility] joint  for her which was fabricated over at [facility].  We will have her follow-up with Dr. Teressa Senter as planned.  In  her case she is lacking kidney function in one kidney and I inclined to  minimize amount of medications she takes orally and I think a small  amount of steroid to help manage her thumb arthritis is appropriate.           ______________________________  Brantley Stage, M.D.     DMK/MedQ  D:  04/03/2007 10:51:22  T:  04/03/2007 13:34:09  Job #:  [id number]   cc:   [name], MD  [phone] [address] [place], [place] [address]

## 2010-09-01 NOTE — Procedures (Signed)
Lauren, [name] NO.:  [id number]   MEDICAL RECORD NO.:  [id number]          PATIENT TYPE:  REC   LOCATION:  [facility]PC                          FACILITY:  [facility]   PHYSICIAN:  Erick Colace, M.D.DATE OF BIRTH:  1930-04-01   DATE OF PROCEDURE:  DATE OF DISCHARGE:                               OPERATIVE REPORT   DATE 10/27/06 This is L4-5 translaminar epidural steroid injection, right  paramedian approach under fluoroscopic guidance.   INDICATIONS:  Bilateral lower extremity radicular pain due to lumbar  spinal stenosis.   Pain is only partial response to medication management.   Informed consent was obtained after describing risks and benefits of  procedure to the patient.  These include bleeding, bruising, infection,  loss of bowel or bladder function, temporary or permanent paralysis.  She elects to proceed and has given written consent.  The patient placed  prone on fluoroscopy table.  Betadine prep, sterile drape.  25 gauge  inch and half needle was used to anesthetize skin and subcu tissue 1%  lidocaine x2 ccs.  Then an 18 gauge Hustead needle was inserted under  fluoroscopic guidance right paramedian L4-5 approach.  Loss of  resistance obtained confirmed.  Omnipaque 180 x 0.5 mL under live  fluoro, followed by injection of 2 mL of 1% MPF lidocaine, 2 mL of 40 mg  were per mL Depo-Medrol.  The patient tolerated the procedure well.  Pre  injection pain level 3/10, she has pain up to 7/10 with activity.  Post  injection pain level 0/10.  She will follow up with Dr. Pamelia Hoit.      Erick Colace, M.D.  Electronically Signed     AEK/MEDQ  D:  10/27/2006 14:52:16  T:  10/28/2006 10:00:14  Job:  [id number]   cc:   Brantley Stage, M.D.  Fax: [phone]

## 2010-09-01 NOTE — Assessment & Plan Note (Signed)
[name] is a 77 year old elderly female who is followed in our pain  and rehabilitative clinic for multiple chronic pain complaints including  lumbago, right knee pain status post right knee replacement, bilateral  hand pain worse on the left, and her chief complaint today is low back  and leg pain, and she is requesting another epidural steroid injection.   Her last epidural steroid injection was done November 25, 2006, by Dr.  Wynn Banker.   She states her average pain is between a 3 and a 5 on a scale of 10,  moderately interferes with her general activity.  Sleep is good.  She  gets fair relief with current medications that she is on.  Pain is  localized to the shoulders, the right knee especially.  She has some  mild groin pain, mostly on the right, and some pain that radiates from  the low back down the backs of both legs.   She is getting fair relief with the current medications.   Medications provided by our clinic include:  1. Ultracet.  She is currently taking one p.o. nightly.  2. Neurontin, she is taking 100 mg three times a day.  3. Lidoderm p.r.n.  4. Flexeril on a p.r.n. basis as well.   FUNCTIONAL STATUS:  She is able to walk 30 minutes at a time.  She  is  able to drive.  She is able to climb stairs.  She is independent with  her self-care, including meal prep, shopping.   Admits to intermittent tingling in the lower extremities.  Denies  depression, anxiety or suicidal ideation.  Denies problems controlling  bladder.  Admits to occasional diarrhea and constipation, occasional  abdominal pain.   She does complain of some intermittent nasal drainage.   PAST MEDICAL HISTORY:  Unchanged.  She has one functioning kidney.   Social and family history are unchanged.   EXAM TODAY:  Blood pressure is 130/80, pulse 70, respirations 18, 97%  saturated on room air.  She is a well-developed, well-nourished, elderly female who appears a  bit younger than her stated age.   She is oriented x3.  Speech is clear.  Affect is bright.  She is alert, cooperative and pleasant.  She follows  commands without any difficulty.  Transitioning from sitting to standing is done with ease.  Gait in the  room is non-antalgic.  She has limitations in lumbar motion in all  planes.  Reflexes are 1+ at the patellar and Achilles tendons.  Her strength is  good without focal deficits.   IMPRESSION:  1. Cervical and lumbar spondylosis.  2. Status post laminectomy at L5-S1 remotely.  3. History of facet arthropathy.  4. Right knee pain, status post replacement.  5. Intermittent bilateral lower extremity pain.  6. Bilateral shoulder pain.  7. Left wrist pain secondary to degenerative joint disease, currently      followed by Dr. Teressa Senter for this.   PLAN:  Since she increased her Neurontin she has not noticed a  significant improvement in the pain in her left wrist.  She believes she  has done a bit better with the Ultracet.  In light of that we will  switch her medications to Neurontin 100 mg two tablets p.o. nightly and  Ultracet one tablet p.o. t.i.d., as well as the p.r.n. Lidoderm.  She is  set up for an epidural injection within the next week.  We will see her  back in a month as she has been stable  on the above medications, she is  not experiencing any problems with oversedation or constipation from  these medications, and she is able to maintain a relatively functional  lifestyle despite multiple physical impairments.           ______________________________  Brantley Stage, M.D.     DMK/MedQ  D:  07/26/2007 15:00:27  T:  07/26/2007 15:17:02  Job #:  [id number]

## 2010-09-01 NOTE — Telephone Encounter (Signed)
Addended by: Army Fossa on: 09/01/2010 03:14 PM   Modules accepted: Orders

## 2010-09-01 NOTE — Procedures (Signed)
NAMESHARY, LAMOS NO.:  [id number]   MEDICAL RECORD NO.:  [id number]         PATIENT TYPE:  AECP   LOCATION:                                 FACILITY:   PHYSICIAN:  Erick Colace, M.D.DATE OF BIRTH:  09/18/1929   DATE OF PROCEDURE:  03/03/2008  DATE OF DISCHARGE:                               OPERATIVE REPORT   PROCEDURE:  Left L4-L5 transforaminal lumbar epidural steroid injection  under fluoroscopic guidance.   INDICATIONS:  Left lumbosacral radiculitis.  Pain is only partially  responsive to medication management and other conservative care.  Pain  interferes with ambulation and household duties.   Informed consent was obtained after describing risks and benefits of the  procedure with the patient.  These include bleeding, bruising, and  infection.  She elects to proceed and has given written consent.  The  patient placed prone on fluoroscopy table.  Betadine prep, sterilely  draped.  A 25-gauge, 1-1/2-inch needle was used to anesthetize the skin  and subcutaneous tissue, 1% lidocaine x2 mL, and 22-gauge, 3-1/2-inch  spinal needle was inserted under fluoroscopic guidance first starting at  the left L4-L5 intervertebral foramen.  AP, lateral, and oblique imaging  utilized.  Omnipaque 180 under live fluoro demonstrated good nerve root  and epidural spread followed by injection of 1 mL of 10 mg/mL  dexamethasone and 2 mL of 1% MPF lidocaine.  The patient tolerated the  procedure well.  Pre and post injection vitals stable.  Follow up with  Dr. Pamelia Hoit.      Erick Colace, M.D.  Electronically Signed     AEK/MEDQ  D:  03/04/2008 13:33:45  T:  03/05/2008 03:40:55  Job:  [id number]   cc:   [name], MD  [phone] [place], [place] [address]

## 2010-09-04 NOTE — Discharge Summary (Signed)
NAME:  [name], [name]                       ACCOUNT NO.:  [id number]   MEDICAL RECORD NO.:  [id number]                   PATIENT TYPE:  INP   LOCATION:  0442                                 FACILITY:  [facility]   PHYSICIAN:  Anselm Pancoast. Zachery Dakins, M.D.          DATE OF BIRTH:  21-May-1929   DATE OF ADMISSION:  10/07/2003  DATE OF DISCHARGE:  10/08/2003                                 DISCHARGE SUMMARY   DISCHARGING DIAGNOSES:  Chronic constipation, partial small bowel  obstruction relieved with enemas and NG suction.   HISTORY:  [name] is a 77 year old female, who was operated on by  Dr. Vedia Coffer approximately 3 months earlier with a laparotomy and lysis of  adhesions for reoccurring chronic symptoms of partial small bowel  obstruction.  At surgery, a cyst was removed, but she did not have dense  adhesions and started having bowel function the first postop day.  She is on  multiple chronic medications for cardiac __________ type symptoms and over  the last 3-4 days has had symptoms of cramping, bloating and presented to  the emergency room where she was seen by the ER physician.  They obtained a  flat and upright abdominal film that showed significant stool throughout the  colon and had distended small bowel and thought this was possibly a small  bowel recurring obstruction.  I was on-call and asked to see her in Dr.  Eliberto Ivory absence.  On examination she was not acutely tender.  A review of  the abdomen showed that she had obvious solid stool showed that she had  obvious solid stool throughout her colon and a little bit of small bowel  dilatation.  I recommended that we start her on intravenous fluids, and I  sent her to the floor for a for a couple of enemas, thinking that we would  need to place an NG tube.  When she got to the floor she had had the enemas  and she was not more nauseated and tried to talk me out of putting an NG.  With the good results with the enemas and she  had not vomited the following  morning, we started her on a liquid diet, which she tolerated and desired to  be released that evening.  I repeated a KUB the morning of the 21st and this  was improved and it was thought that she could be released, but instructed  her to continue with basically a full liquid diet for approximately 48  hours.  She has multiple medications; one is Skelaxin 800 mg for headaches  and muscle spasms, and I think that is causing her to have very sluggish  bowels and would recommend that she discuss this with her medical doctor,  and will need chronic laxatives to prevent her from becoming constipated or  possibly decreasing the Skelaxin, which is for headaches and muscle spasm.  She will see her doctor, who I think is Dr.  __________ , and she is also  seeing a gastroenterologist and I think it is really more of a chronic  motility problem than actually due to adhesions.  She will return to see Dr.  Magnus Ivan if she is having other problems,  but she is over three months from  her surgery and actually had been released by him.                                               Anselm Pancoast. Zachery Dakins, M.D.    WJW/MEDQ  D:  10/22/2003  T:  10/22/2003  Job:  [id number]

## 2010-09-04 NOTE — Assessment & Plan Note (Signed)
[facility]                                   ON-CALL NOTE   NAME:HENSONCleda, Lauren                    MRN:          [id number]  DATE:01/15/2006                            DOB:          [birth year]    PHONE CALL NOTE.   PHONE NUMBER:  [phone]   PRIMARY CARE DOCTOR:  [name], MD   The patient is a 77 year old female who underwent total knee replacement in  March 2007.  Apparently she has done very well since that time and has had  significant pain.  She has also had lots of nausea, vomiting, and dizziness  on narcotics.  She recently saw Dr. Drue Novel and her oxycodone  was decreased  from 20 to 10; however, today her nausea and vomiting continued to get  worse.  Tonight she started having significant shortness of breath which she  described as worse lying flat.  She had not noticed a lot of edema.   Over the phone it was difficult to assess the severity of the patient's  illness.  It sounds like she has had multiple visits with her primary care  doctor since this surgery for somatic complaints.  However, the new  shortness of breath with orthopnea was relatively concerning. I think both  heart failure, pulmonary embolus, and even potentially pneumonia should be  considered.  I told the patient that I would recommend that she be seen in  the emergency department.  It is currently 5 a.m. and I heard from her on  September 29 at roughly 1 a.m.  We will see if she comes to the emergency  department, ultimately.   Regardless, she should have followup with Dr. Drue Novel, in the near future in the  outpatient setting.            ______________________________  Marrian Salvage. Freida Busman, MD      LAA/MedQ  DD:  01/15/2006  DT:  01/17/2006  Job #:  [id number]   cc:   [name], MD

## 2010-09-04 NOTE — H&P (Signed)
NAMEVENERA, [name] NO.:  [id number]   MEDICAL RECORD NO.:  [id number]          PATIENT TYPE:  INP   LOCATION:  0103                         FACILITY:  [facility]   PHYSICIAN:  Angelia Mould. Derrell Lolling, M.D.DATE OF BIRTH:  1929-07-30   DATE OF ADMISSION:  07/12/2004  DATE OF DISCHARGE:                                HISTORY & PHYSICAL   CHIEF COMPLAINT:  Abdominal distention, abdominal pain, vomiting.   HISTORY OF PRESENT ILLNESS:  This is a 77 year old white female who presents  to the emergency room with a 12-16 hour history of abdominal distention,  three episodes of brownish bilious vomiting, and some diarrhea.  She states  that after she ate lunch on Saturday, she became distended.  She was  somewhat anorexia but had a peanut butter sandwich and some chicken and rice  soup for supper and after that, she vomited three times and became more  distended.  She stated that she was having steady, centralized abdominal  pain which got worse.  She had had some solid bowel movements earlier in the  day on Saturday and then had 2 or 3 loose stools after 8:00 p.m. last night.  She came to the emergency room and has been evaluated by Dr. Mariel Aloe.  He suspects she has a small bowel obstruction and I was called to see her.   At this time, she states that her pain has completely resolved and she feels  fine but she is still distended.   PAST MEDICAL HISTORY:  1.  Total abdominal hysterectomy for endometriosis through a Pfannenstiel      incision.  2.  Laparoscopic cholecystectomy in the 1980s in [place], [place].  3.  Hospitalization August 2004 for small bowel obstruction which resolved.  4.  Hospitalization January 2005 for small bowel obstruction which resolved.  5.  Laparotomy with lysis of adhesions by Dr. Carman Ching in March 2005      but she was told that she had very minimal adhesions.  A right ovarian      cyst was removed at that time.  6.  She saw Dr.  Zachery Dakins in June 2005 for abdominal distention which      resolved within 24 hours.  7.  She is followed by Dr. Melvia Heaps, has had several colonoscopies.  8.  Chronic constipation and motility disorder.  9.  Tonsillectomy.  10. Pilonidal cystectomy in the past.  11. Laminectomy in 1970.  12. Bladder suspension in 1993.  13. Surgery on her right thumb 1998.  14. Cervical lymph node biopsy in 1998.  15. She has had three skin cancers, four colonoscopies.  16. History of an irregular heartbeat.  17. History of fibrocystic breast disease.  18. History of TMJ syndrome.  19. History of diverticulosis.  20. Left carpal tunnel syndrome.   CURRENT MEDICATIONS:  1.  Omeprazole 20 mg a day.  2.  Etodolac (generic Lodine) 500 mg a day.  3.  Klonopin 0.5 mg at night as needed.  4.  Lipitor 40 mg a day.  5.  Cartia XT 180 mg caplet daily.  6.  MiraLax 17 ounces daily.  7.  Darvocet N-100 1-2 tablets twice daily as needed for knee pain.  8.  She takes amoxicillin when she has dental work.  9.  Calcium, vitamin D, multivitamins, fish oil.  10. Tavist Sinus.  11. Anusol suppositories.  12. Baby aspirin daily.   MEDICATION ALLERGIES:  CODEINE causes nausea and dizziness;  NORTRIPTYLINE  causes hallucinations; AMITRIPTYLINE minor hallucinations;  CARISOPRODOL  causes nightmares and skin rash;  Bextra causes pruritus;  DAYPRO causes  abdominal pain; IMITREX causes rash;  FLAGYL causes abdominal pain; MOBIC  causes facial redness; VIOXX causes a rash on the chest and elsewhere.   SOCIAL HISTORY:  The patient was recently widowed in October 2005.  Her  husband had been residing in a skilled nursing facility for some time.  She  lives in [place] and lives alone.  Denies the use of tobacco.  Drinks  alcohol rarely.   FAMILY HISTORY:  Mother deceased, had breast cancer, had a pacemaker, had  diabetes.  Father deceased, had coronary artery disease and lung cancer.  One brother has bladder  cancer.   REVIEW OF SYSTEMS:  Fifteen review of systems is performed and is  noncontributory except as described above.   PHYSICAL EXAMINATION:  Pleasant older woman who appears fit for her age.  She is no distress.  Temperature 97.2, blood pressure 125/67, pulse 69,  respirations 20, oxygen saturation 96-98% on room air.  EYES:  Sclerae clear.  Extraocular movements intact.  EARS, NOSE, MOUTH, THROAT:  Nose, throat , tongue, and oropharynx without  lesions.  NECK:  Supple, nontender.  No adenopathy.  No thyroid mass.  No jugular  venous distention.  LUNGS:  Clear to auscultation.  No chest wall tenderness.  BREASTS:  Not examined.  HEART:  Regular rate and rhythm.  No murmur.  Radial and femoral pulses are  palpable.  No peripheral edema.  ABDOMEN:  Clearly distended.  Bowel sounds are hypoactive, not obstructive  sounding in nature.  The abdomen is soft and really not tender.  There is  certainly no guarding or peritoneal signs.  Well-healed upper abdominal  laparoscopic scars.  Well-healed lower midline scar.  Well-healed  Pfannenstiel scar.  No hernia.  No masses noted.  No inguinal, mass, or  adenopathy either.  EXTREMITIES:  She moves all four extremities well without pain or deformity.  NEUROLOGIC:  No gross motor or sensory deficits.   ADMISSION DATA:  Chest x-ray shows no active disease.  Abdominal x-rays show  distended small bowel with differential air fluid levels suggesting a  partial small bowel obstruction.  There is a little bit of air in the colon  but not much.  Clips are noted in the right upper quadrant from previous  cholecystectomy.   Hemoglobin 12.6, white blood cell count 15,000.  Potassium 3.5.  Complete  metabolic panel otherwise normal.   ASSESSMENT:  1.  Recurrent abdominal distention.  Objectively, this is most likely a      partial small bowel obstruction although I cannot rule out that this is     a manifestation of a motility disorder.  2.   Relative hypokalemia.  3.  History of chronic constipation and motility disorder.  4.  History of laparotomy for lysis of adhesions for bowel obstruction.  5.  Status post cholecystectomy.  6.  Status post hysterectomy for endometriosis.  7.  Vague history of cardiac arrhythmias.  8.  History of hyperlipidemia.   PLAN:  1.  The  patient will be admitted, placed at bowel rest, IV hydration and      replacement of potassium.  2  2.  She has refused the nasogastric tube stating that it gave her nosebleed      last time.  Although I think it would be ideal to have a nasogastric      tube, I think that we can reasonably forego that at this time since she      does not really seem to have much abdominal tenderness.  3.  We will repeat her lab work and x-ray tomorrow morning and decide      whether she needs a laparotomy or not depending on clinical trends.      HMI/MEDQ  D:  07/12/2004  T:  07/12/2004  Job:  [id number]   cc:   Wanda Plump, MD [facility]  [phone] [place], [place] [address]   Barbette Hair. Arlyce Dice, M.D. [facility]

## 2010-09-04 NOTE — H&P (Signed)
[name], [name] NO.:  [id number]   MEDICAL RECORD NO.:  [id number]          PATIENT TYPE:  OUT   LOCATION:  XRAY                         FACILITY:  [facility]   PHYSICIAN:  Barbette Hair. Arlyce Dice, M.D. [facility] OF BIRTH:  [birth year]   DATE OF ADMISSION:  02/10/2005  DATE OF DISCHARGE:                                HISTORY & PHYSICAL   CHIEF COMPLAINT:  Abdominal pain.   [name] is a 77 year old white female admitted with small bowel  obstruction.  She has a history of recurrent small bowel obstructions and  was admitted several times in the past with spontaneous resolution.  She  underwent a laparoscopy on one occasion, though no adhesions were seen.  Over the last month she has been complaining of increasing abdominal  distention.  Over the last 12 hours she has had a worsening distention,  abdominal pain and vomiting.  Today an abdominal x-ray demonstrated a small  bowel obstruction.   PAST MEDICAL HISTORY:  Pertinent for arthritis and headaches.  She has  mitral valve regurgitation.  She is status post hysterectomy, appendectomy,  cholecystectomy, tonsillectomy, laminectomy, bladder suspension surgery and  right thumb tendon surgery.   FAMILY HISTORY:  Pertinent for mother with breast cancer.   MEDICATIONS:  Klonopin, Lipitor, Miralax, Claritin, baby aspirin, Prilosec,  Xanax, atenolol, Premarin and Lodine.   She is allergic to CELEBREX, CODEINE, NORTRIPTYLINE, AMITRIPTYLINE, BEXTRA,  DAYPRO, IMITREX, METRONIDAZOLE, MOBIC and VIOXX.   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   REVIEW OF SYSTEMS:  Positive for joint and back pain on exam.   PHYSICAL EXAMINATION:  VITAL SIGNS:  Pulse 70, blood pressure 130/70, weight  148.  HEENT:  Within normal limits.  CHEST:  Clear.  CARDIAC:  There is a 1/6 holosystolic murmur at the left sternal border.  ABDOMEN:  Distended and tympanitic.  Bowel sounds are decreased.  There is  mild left lower quadrant  tenderness without guarding or rebound.  There are  no abdominal masses or organomegaly.   IMPRESSION:  Small bowel obstruction.   RECOMMENDATIONS:  1.  Admit for NG tube decompression and IV hydration.  2.  Surgical consult.      Barbette Hair. Arlyce Dice, M.D. [facility]  Electronically Signed     RDK/MEDQ  D:  02/10/2005  T:  02/10/2005  Job:  [id number]   cc:   Wanda Plump, MD [facility]  [phone] [address] [place], [place] [address]

## 2010-09-04 NOTE — Assessment & Plan Note (Signed)
[facility]                                 ON-CALL NOTE   NAME:[name], [name]                    MRN:          [id number]  DATE:06/11/2006                            DOB:          1929/06/02    PHONE NUMBER: [phone].   Patient of Dr. Drue Novel.   Phone call came at 8:07 p.m. on June 11, 2006.   Ms. Sze has been on Tamiflu since Thursday from Dr. Drue Novel for apparent  flu. She now calls me because she has been having trouble breathing all  day, read on the package insert that breathing problems can be  associated with Tamiflu.  I am listening to her and she sounds like she  is having trouble speaking because of her breathing but she says she is  doing better now than she was earlier in the day. She asked if she  should just hold off on the Tamiflu.   PLAN:  I told her that it is unlikely that the Tamiflu is causing the  breathing problem and more likely that she has a more serious  respiratory infection. I told her that she needed emergency room  evaluation tonight. I suggested she call 911 and she was going to get a  neighbor to bring her. That may be reasonable but I told her if she  called 911 they could get some oxygen on her that may give her some  immediate relief so I think she was going to do that.     Karie Schwalbe, MD  Electronically Signed    RIL/MedQ  DD: 06/11/2006  DT: 06/11/2006  Job #: [id number]   cc:   [name], MD

## 2010-09-04 NOTE — H&P (Signed)
NAME:  [name], [name]                       ACCOUNT NO.:  [id number]   MEDICAL RECORD NO.:  [id number]                   PATIENT TYPE:  INP   LOCATION:  0356                                 FACILITY:  [facility]   PHYSICIAN:  Titus Dubin. Alwyn Ren, M.D. [facility]         DATE OF BIRTH:  March 04, 1930   DATE OF ADMISSION:  05/04/2003  DATE OF DISCHARGE:                                HISTORY & PHYSICAL   ADMISSION HISTORY & PHYSICAL   HISTORY:  [name] is a 77 year old white female, admitted with small  bowel obstruction clinically, with ileus on abdominal films.  She began to  have abdominal pain on January 12 which she described as diffuse throughout  the abdomen as if there were bricks in my belly.  This was associated with  bloating and generalized soreness. She had occasional lower abdominal pain  which was sharp.   She had had a root canal on January 10 and was placed on Darvocet. This  caused some dizziness, and she questions whether there was some relationship  of the Darvocet to the abdominal pain.  She took Darvocet intermittently  from January 10 to January 12 and then switched to Advil. She was having the  abdominal pain prior to starting the Advil.   On January 12, she had loose-to-watery stools x8.  She took Pepto Bismol  without relief.  She contacted Dr. Macon Large office and was told by the  nurse to take Mylanta.  Because of persistence of the pain, she came to the  emergency room and was admitted at 3 a.m. on January 15.   Significantly, she had a small bowel obstruction in July, 2004, and was  followed by Dr. Abigail Miyamoto.  He had recommended Milk of Magnesia as  needed for constipation at the time of discharge. She believes that he told  her that surgery would be necessary if there was recurrence.   PAST MEDICAL HISTORY:  Past medical history is complicated.  She has had a  total abdominal hysterectomy for endometriosis.  She has a history of an  ovarian cyst.  She  has had a tonsillectomy, pilonidal cystectomy,  cholecystectomy.  She had a laminectomy in 1970, bladder suspension in 1993.  She had surgery on the right thumb in 1998.  She had cervical lymph nodes  removed from the neck in 1998.  She had some  nodules  removed from her  right shoulder.  She has had three skin cancers.  She has had four  colonoscopies; the most recent one was November, 2004, and she is due for  repeat in 2009.   Medical problems include an irregular heartbeat, slight valvular  insufficiency.  She had fibrocystic breast disease.  She had an aspiration  of the left breast previously.  She has TMJ syndrome.  She has had  diverticulosis and diverticulitis.  She has been hospitalized on three  occasions, in 2000, 2001, and 2004, for abdominal pain  with small bowel  obstruction.  She had carpal tunnel syndrome on the left.  She has  __________  related TMJ, stress migraine.  She wears orthotics for foot  pain.   She is on:  1. Protonix 40 mg daily.  2. Lactulose 3 teaspoons daily.  3. Detrol LA 4 mg daily.  4. Generic Lodine 500 mg daily.  5. Cenestin 0.3 from her gynecologist.  6. Lipitor 40 mg daily.  7. Cartia XT 80 mg daily.  8. Skelaxin 800 mg daily.  9. She takes amoxicillin prior to dental work.  10.      She is on multivitamins, calcium, vitamin D, omega-3 fatty acids.  11.      Claritin.   She is intolerant or allergic to CELEBREX, CODEINE, NORTRIPTYLINE,  AMITRIPTYLINE, CARISOPRODOL, BEXTRA, DAYPRO,  IMITREX, METRONIDAZOLE, MOBIC,  VIOXX.   FAMILY HISTORY:  Pacemaker in her mother.  Her mother had breast cancer and  mastectomy and died at 68.  She may have had diabetes late in life.  Father  had coronary artery disease in his 10s, cancer of the lung.  One brother had  bladder cancer.   REVIEW OF SYMPTOMS:  As outlined above.   PHYSICAL EXAM:  At this time, temperature is 97.4, pulse 74, respiratory  rate 20, blood pressure 128/68, and O2 sat is 98% on  room air.  HEENT:  Tongue is slightly dry.  She has no lymphadenopathy about the head,  neck or axilla.  NECK:  She has no carotid bruits.  Thyroid is normal to palpation.  CHEST:  There are minimal rales, but chest is clear.  HEART:  She has a grade-1, AS systolic murmur, and rhythm is regular.  ABDOMEN:  Bowel sounds are hyperactive, and abdomen is distended but soft.  EXTREMITIES:  Pedal pulses intact.  There is no edema.  NEUROLOGIC:  There are no neural/neuropsychiatric findings.  BREASTS/PELVIC/RECTAL:  Deferred, but stool cards will be monitored in the  hospital.   White count was 13,000 with 88% neutrophils.  Lipase was 17.  Amylase was  normal.  Glucose 133.  Urine revealed a few bacteria.   At this time, she has recurrent small bowel obstruction which is resolving  clinically.  CT of the abdomen and chest will be performed. Urine culture  and sensitivity will also be collected.  Dr. Magnus Ivan will be consulted.   Dr. Karma Greaser will review the medications; specifically, if Lodine is  contraindicated if she is on Protonix.  There may be another blood pressure  pill which may be effective without potential constipation as might be seen  with the calcium channel blockers.                                               Titus Dubin. Alwyn Ren, M.D. [facility]    WFH/MEDQ  D:  05/04/2003  T:  05/04/2003  Job:  [id number]   cc:   Barbette Hair. Arlyce Dice, M.D. [facility]   Wanda Plump, MD [facility]  [phone] [place], [place] [address]   Abigail Miyamoto, M.D.  [address]  [place]  [place] [address]  Fax: [phone]

## 2010-09-04 NOTE — H&P (Signed)
NAME:  [name]                       ACCOUNT NO.:  [id number]   MEDICAL RECORD NO.:  [id number]                   PATIENT TYPE:  INP   LOCATION:  0442                                 FACILITY:  [facility]   PHYSICIAN:  Anselm Pancoast. Zachery Dakins, M.D.          DATE OF BIRTH:  Apr 29, 1929   DATE OF ADMISSION:  10/07/2003  DATE OF DISCHARGE:                                HISTORY & PHYSICAL   CHIEF COMPLAINT:  Abdominal pain, nausea and vomiting.   HISTORY:  [name] is a 77 year old female who was operated on by Dr.  Magnus Ivan about three months ago for laparotomy for lysis of adhesions for  chronic recurring symptoms of partial small bowel obstruction.  At surgery,  a cyst was removed, but she did not have extensive adhesions and started  having bowel function on the first postoperative day and was released  shortly afterwards.  She is on multiple chronic medications for cardiac and  kind of musculoskeletal type of symptoms.   MEDICATIONS:  1. Omeprazole 20 mg a day.  2. __________ generic 500 mg a day.  3. Senestren 0.3 mg hormone daily.  4. Lipitor 40 mg a day.  5. Cartia XT 180 mg tablets 1 per day.  6. Skelaxin 800 mg a day for headaches and muscle spasm.  7. Lactulose 1-2 tablespoons p.r.n. for chronic constipation.   Patient was seen in the emergency room.  X-rays are obtained which showed  significant stool throughout the colon and dilating the small bowel.  I was  on call and was asked to see the patient in Dr. Eliberto Ivory absence.   On examination, she was not acutely tender.  On review of the x-rays with  her, it was obvious that she has solid stool throughout her colon and a  little bit of small bowel dilatation.  I recommended starting her on IV  fluids, send her up on the floor for a couple of enemas, and then I would  reassess these before an NG tube was placed, which the ER physician had  recommended.   I have now seen her approximately eight hours later,  and she says that she  has passed a large amount of stool with the two enemas.  She is feeling  better.  She has not vomited since she has been here.  Her abdomen was a  little crampy but is definitely not acutely distended.  I think we can go  ahead and start a clear-liquid diet.  Dr. Drue Novel, her medical doctor, is going  to need to review her medications and see if any of these are attributed to  this kind of chronic constipation/motility problem.   PAST MEDICAL HISTORY:  Please refer to the old chart.   PAST SURGICAL HISTORY:  Please refer to the old chart.   ALLERGIES:  Please refer to the old chart.   PHYSICAL EXAMINATION:  VITAL SIGNS:  In the ER, temperature was 96.4, blood  pressure 150/84, pulse 69, respirations 18.  GENERAL:  She is an adequately hydrated female who appears maybe younger  than her stated age in no acute distress.  Good breath sounds bilaterally.  CARDIAC:  Normal sinus rhythm.  ABDOMEN:  A little bloated but not acutely distended.  A well-healed midline  incision and a moderate amount of stool in her rectum and throughout her  colon on her KUB.   PLAN:  Patient has been started on IVs with soapsuds enemas, and I am going  to plan on repeating a KUB and upright abdomen in the morning.  Hopefully,  if we can clear out the constipation, her bowel function will function, and  she can be released on a liquid diet.  After her colon is completely  cleansed out and reassessed, the laxatives that are needed, I would fear  that the Skelaxin, which is a muscle relaxer, is probably contributing to  her motility problem.                                               Anselm Pancoast. Zachery Dakins, M.D.    WJW/MEDQ  D:  10/07/2003  T:  10/07/2003  Job:  [id number]

## 2010-09-04 NOTE — H&P (Signed)
NAME:  [name], [name]                       ACCOUNT NO.:  [id number]   MEDICAL RECORD NO.:  [id number]                   PATIENT TYPE:  INP   LOCATION:  5725                                 FACILITY:  [facility]   PHYSICIAN:  Abigail Miyamoto, M.D.              DATE OF BIRTH:  October 10, 1929   DATE OF ADMISSION:  07/08/2003  DATE OF DISCHARGE:                                HISTORY & PHYSICAL   CHIEF COMPLAINT:  Abdominal distention.   HISTORY:  [name] is a 77 year old female who has had a history  over the past five years of multiple small bowel obstructions.  They have  always improved with conservative management and NG tube placement, however,  the episodes have been coming on more frequently.  She was admitted in  January of this year and the NG tube was placed at that time, it resolved.  Now she presents again with nausea, vomiting and abdominal distention.  It  has been slowly getting worse but she is still passing some flatus.  She is  here today at the office for evaluation.  She denies chest pain or shortness  of breath.  She is able to keep some liquids down and has not thrown up  since approximately three to four days ago.  Her pain is crampy in nature.   PAST MEDICAL HISTORY:  1. Irregular heart beats.  2. Fibrocystic breast disease  3. A history of an ovarian cyst.   PAST SURGICAL HISTORY:  1. Tonsillectomy.  2. Pilonidal cystectomy.  3. Hysterectomy.  4. Appendectomy.  5. Surgery for endometriosis.  6. Bladder suspension.  7. Cholecystectomy.  8. Surgery on her right thumb.  9. Surgery on her back.  10.      Removal of skin cancer.   MEDICATIONS:  Protonix, etodolac, Cenestin, Lipitor, Cartia XT, Skelaxin,  Ultracet and Analpram.   ALLERGIES:  1. FLAGYL  2. CODEINE.  3. VIOXX.  4. IMITREX.  5. CELEBREX.  6. DAYPRO.  7. NORTRIPTYLINE.  8. __________ .  9. BEXTRA.   SOCIAL HISTORY:  She does not smoke, does not drink alcohol.   FAMILY  HISTORY:  Again, she currently lives alone.   REVIEW OF SYSTEMS:  Otherwise, unremarkable, as sited on the chart.   PHYSICAL EXAMINATION:  CONSTITUTIONAL: Reveals a thin, well-developed, well-  nourished female, in no acute distress.  VITAL SIGNS: Weight is 146 pounds, blood pressure 102/69, pulse is 84.  GENERALLY: She is minimally uncomfortable.  LUNGS: Clear to auscultation bilaterally with normal respiratory effort.  CARDIOVASCULAR: Regular rate and rhythm with no murmurs.  There is no  peripheral edema.  ABDOMEN: Diffusely distended and mildly tender.  There are minimal bowel  sounds.  There are no hernias.  EXTREMITIES: Warm and well perfused.   X-RAY DATA:  The patient has x-rays from last week which shows her to have a  diffusely distended small bowel.   IMPRESSION/PLAN:  This  is a patient with recurrent partial small bowel  obstruction and at this point she continues to remain diffusely distended.  She is feeling somewhat better but is still diffusely distended so the plan  at this point will to be to admit her to the hospital for NG decompression  and planned exploratory laparotomy later this week to lyse adhesions.                                                Abigail Miyamoto, M.D.    DB/MEDQ  D:  07/08/2003  T:  07/09/2003  Job:  [id number]

## 2010-09-04 NOTE — Group Therapy Note (Signed)
[name] is a 77 year old widowed white female who was referred by Dr.  [name] for management of right knee pain.   [name] states she has a history of right knee pain dating back to  2005 or 2006.  She has had multiple evaluations over the years with  rheumatologist and orthopedic surgeons, including Dr. Lequita Halt.  She  eventually underwent right total knee replacement by Dr. Freddi Che on  November 15, 2005.   Prior to her surgery, she states her knee pain was fairly constant, and  she continues to have right knee pain, which is a bit more intermittent  since the surgery.  She states that her pain in the morning is about a  0/10 to 1/10.  Toward the evening, it gets up to about a 5/10.  Activity  seems to increase her discomfort.  She also reports bilateral calf  discomfort, which comes and goes as well.  She states the pain seems  like a shooting pain.  With prolonged sitting, she feels a stiffness in  the knee.  The pain interferes a lot with her general activity and  moderately with enjoyment of life.   Her sleep is fair.   She is able to walk about 20 minutes at a time.  She is able to drive.  She is able to climb stairs.  She is independent with her self care.  She lives independently.  She is able to grocery shop and do higher  level household activities.   Admits to intermittent bladder and bowel problems.  Admits to some  depression.  Denies suicidal ideation.   For pain management in the past, she has used Percocet and OxyContin as  well as Tylenol.  She has reduced her narcotics over the last month or  so and has been able to manage her pain with just Extra Strength  Tylenol.   She brings in an extensive medical history, which is attached to the  chart today.  Her past medical history is remarkable for some cardiac  problems, recently treated for depression.   PAST SURGICAL HISTORY:  Remarkable for tonsillectomy, pilonidal cyst  removal, endometriosis,  hysterectomy, appendectomy, laminectomy in 1970,  bladder suspension in 1993, cholecystectomy in 1994.  Right thumb tendon  operation in 1998.  Nodules removed from left side of the neck in 1998.  Four skin cancers removed in 2001, 2002, and 2005, all basal cell  cancers.  She had adhesions from her intestines.  Right ovary with cyst  removed in 2005.  History of phlebitis in right wrist.  Status post  arthroscopic surgery to the right knee January of 2006 and February  2007.  Cataracts removed March 2007 in the right, January 2008 in the  left eye.  Right total knee replacement in July 2007.   Apparently, she has had a stress test in 2003, which is okay.  Colonoscopy, last 1 was in 2004 with non-cancerous polyps.  Bone density  in 2005 was normal and in 2008 was normal.   MEDICATIONS:  1. Celebrex 200 mg 1 to 2 per day.  2. Xanax 0.5 one per day.  3. Zoloft 50 mg 1 per day.  4. Lipitor 40 mg 1 per day.  5. Premarin 0.3 per day.  6. Diltiaz 1 per day.  7. MiraLax p.r.n.   She takes multiple over-the-counter medicines including  1. An aspirin a day.  2. Citrucel.  3. Calcium.  4. Multivitamin.  5. Claritin.  6. Gingko biloba.  7. Ben gay.  8. Iron supplementation.   The patient is widowed.  She lives alone.   FAMILY HISTORY:  History of heart disease, lung disease, diabetes.  Brother with alcohol abuse.  Father with tuberculosis.   EXAM:  Blood pressure is 133/68, pulse 70, respirations 16, 97%  saturation on room air.  She is well-developed, well-nourished elderly female who appears her  stated age.  She does not appear in any distress.  She is oriented x3.  Her affect is bright, alert.  She is cooperative and pleasant.  Her  speech is clear.  She follows commands without any difficulty.  She transitions from sitting to standing independently.  No pain  behaviors are displayed.  She does not have an antalgic gait.  She has a  normal stride length.  Tandem gait and  Romberg test are performed  adequately.  Lumbar motion is assessed.  She has fairly good range of  motion with forward flexion as well as extension without pain.  Plantar  flexion is performed adequately as well.  She has a well-healed scar  over her lumbar spine.  Reflexes are 2+ at the patellar tendons, 1+ at the right Achilles  tendon, 2+ at the left Achilles tendon.  EHL strength is diminished  bilaterally.  The rest of her motor exam of the lower extremity is  within normal limits without pain.  Internal and external rotation at  both hips does not increase the pain in her hip or in her low back.  She  does have some crepitus with flexion and extension of the right knee.  She has good range of motion in the right knee status post knee  replacement.  She has a well-healed scar over the anterior patellar  region.  No abnormal tone is noted in the lower extremities.  No abnormal  movements are appreciated.   IMPRESSION:  1. A several year history of right knee pain, now status post total      knee replacement on July of 2007 by Dr. Wyline Mood.  2. Bilateral calf pain, which is intermittent, appearing to be worse      with walking.  3. Status post laminectomy in the 1970s.   PLAN:  [name]'s pain for the most part is fairly well-controlled at  this time.  The mornings are quite good.  Her pain scores are 0-1.  Toward the afternoon, she starts to increase her pain scores a bit and,  in the early evening, she is about a 5/10.  Her pain does vary from day  to day.  Some days she has less pain.  It seems to get worse when she is  more active.  She feels it is fairly well-controlled for the most part  with Extra Strength Tylenol.  Nursing staff counseled her today on  appropriate doses of Tylenol.  Would like to trial her on Ultracet 1 to  2 tablets in the late afternoon and early evening for her bad days.  We went over topical agents, which she may trial, such as Biofreeze and   Lidoderm.  Apparently, she has tried Lidoderm in the past, but would  like to trial again.  She is currently not interested in strong narcotic  medication at this time.  She is using ice and heating pads  appropriately.   In light of [name] previous and remote low back surgery, calf  pain, would like to obtain lumbar flexion and extension to rule out any  type  of a nerve root irritation here.  May consider imaging studies.  However, her pain  scores are fairly low at this time.  Will obtain flexion and extension  films of her lumbar spine.  She states she has had a disk removed in the  past.  Would like to plain radiographs initially.  Will see her back in  a month.           ______________________________  Brantley Stage, M.D.     DMK/MedQ  D:  07/01/2006 11:59:37  T:  07/02/2006 16:27:05  Job #:  [id number]   cc:   [name], MD  [phone] [address] [place], [place] [address]

## 2010-09-04 NOTE — Assessment & Plan Note (Signed)
[name] is a 77 year old, widowed, white female who is a patient of  Dr. [name].  She was sent for management of right leg pain.  She was  seen for the first time on July 01, 2006 in our [facility].  She was trialed on Lidoderm and 1-2 tablets of Ultracet in the  evening for her leg pain.   She is back in today and states that the Lidoderm did seem to help quite  a bit.  However, she is not sure whether or not this may caused some  heart racing.  She states the Ultracet was quite helpful at night.  Her  average pain is about a 6 on a scale of 10.  She states that this  morning she woke up with some increased back pain at about 4:30 and some  radiation down her left leg which is new for her.   Her ambulation is limited to about 1/2 of an hour.  She remains  frustrated with her limitations.  She would like to be able to shop and  stand longer than that.  She reports pain gets worse with activities  such as bending and standing, typically improves with rest, heat, and  pacing her activities as well as medication.  She is getting a little  relief with the meds.  She is independent with her self-care.  She  admits to some numbness around the area of the joint replacement.   PAST MEDICAL HISTORY:  No changes since last visit.   SOCIAL HISTORY:  No changes since last visit.   FAMILY HISTORY:  No changes since last visit.   OTHER INFORMATION:  She does state she had a fall at home, she tripped  on something on the floor and bruised her right shoulder.  No loss of  consciousness.  She feels it is getting better.  She did not get an x-  ray.  Overall, she is not having any problems with the shoulder.   MEDICATIONS:  Medications prescribed by this clinic include:  1. Lidoderm 5%, 12 hours on, 12 hours off.  2. Ultracet 1-2 tablets p.o. q. afternoon as needed.   EXAM:  Blood pressure is 136/59, pulse 65, respirations 16, 96% saturate  on room air.  She is a well  developed, well nourished female who appears  her stated age and does not appear in any distress.  She is oriented x3.  Her speech is clear.  Her affect is bright, alert, cooperative, and  pleasant.  She follows commands without difficulty.  She transitions  from sitting to standing without difficulty.  Her gait in the room is  fairly symmetric.  She is able to tandem walk several steps.  She is  able to perform a Romberg's test without difficulty.  Forward flexion  and extension do not exacerbate any pain at this point.  She has a  healing bruise over her right shoulder.  She has good range of motion  and without pain.  Cervical range of motion is unchanged and without  discomfort as well.  Reflexes in the lower extremities are 2+ at patella  tendons and 1+ at the right Achilles, 2+ at the Achilles tendon.  She  has decreased sensation in the right thigh in the L2-3 dermatome  starting at the upper thigh region to the knee.  Motor strength is well  preserved in both lower extremities, 5/5 at the hip flexors and the  extensors, dorsiflexors, plantar flexors.  EHL with slightly weak in  both lower extremities.   X-RAYS:  X-rays of her lumbar spine were reviewed with her.  These were  done on July 04, 2006; show previous laminectomy defect at L5-S1 as  well as mild degenerative disc disease at L2-3, L3-4, L5-S1.  A bony  spurring particularly was noted at L2-3.   IMPRESSION:  1. Several-year history of right lower extremity pain, status post      total knee replacement, July 2007, Dr. Wyline Mood.  2. Bilateral calf pain which is intermittent, worse with walking.  3. Status post laminectomy in the 1970s.   PLAN:  We will obtain a lumbar MRI to rule out this is the cause for  lower extremity pain, possibly disc involvement or lumbar spurring.  May  consider trialing her on Neurontin to improve right lower extremity  pain.  We will see her back in a month and review treatment options and   review the MRI with her at that time.           ______________________________  Brantley Stage, M.D.     DMK/MedQ  D:  07/28/2006 13:15:48  T:  07/28/2006 14:12:02  Job #:  [id number]

## 2010-09-04 NOTE — Op Note (Signed)
NAME:  [name], [name]                       ACCOUNT NO.:  [id number]   MEDICAL RECORD NO.:  [id number]                   PATIENT TYPE:  INP   LOCATION:  5732                                 FACILITY:  [facility]   PHYSICIAN:  Abigail Miyamoto, M.D.              DATE OF BIRTH:  Jan 15, 1930   DATE OF PROCEDURE:  07/11/2003  DATE OF DISCHARGE:                                 OPERATIVE REPORT   PREOPERATIVE DIAGNOSIS:  Small-bowel obstruction.   POSTOPERATIVE DIAGNOSIS:  Small-bowel obstruction.   PROCEDURE:  1. Exploratory laparotomy.  2. Right oophorectomy.   SURGEON:  Abigail Miyamoto, M.D.   ASSISTANT:  Sheppard Plumber. Earlene Plater, M.D.   ANESTHESIA:  General endotracheal anesthesia.   ESTIMATED BLOOD LOSS:  Minimal.   INDICATIONS:  [name] is a 77 year old female who keeps presenting  with recurrent small-bowel obstructions. They have continued to be relieved  with conservative management however, because of their continued frequency,  the decision has been made to proceed with exploratory laparotomy and lysis  of adhesions.  The patient also has been known to have right ovarian cyst,  therefore we will remove her right ovary.   FINDINGS:  The patient was found to have minimal adhesions.  No definite  point of stricturing was identified.  No other abnormalities were seen in  the entire small or large bowel.  The right ovary did contain a very large  cyst and was removed.   PROCEDURE IN DETAIL:  The patient was brought to the operating room,  identified as [name].  She was placed supine on the operating room  table and general anesthesia was induced.  Her abdomen was then prepped and  draped in the usual sterile fashion.  Using a #10 blade, a midline incision  was then created below the umbilicus.  The incision was carried down through  the fascia with the electrocautery.  The peritoneum was then opened the  entire length of the incision.  Upon entering the abdomen,  the small bowel  was completely eviscerated.  This was easily done and an adhesion may have  been snapped, eviscerating the bowel but none was identified.  The small  bowel was run from the ligament of Treitz to the terminal ileum and cecum  which were easily identified and the bowel was mildly injected proximally  but normal throughout with no evidence of obstructing lesions, masses or  other adhesions.  The colon was likewise examined and although she had some  mild adhesions in the left lower quadrant, no obstructions were identified  as well.  The patient's right ovary was examined and found to contain a very  large cyst.  The ovary was dissected free and several adhesions were taken  down with the electrocautery.  The vessels to the ovary were identified and  clipped with, were clamped with Kelly clamps, cut and tied with 2-0 silk  ties.  The entire ovary was then  completely removed and sent to pathology  for identification.  The pedicle was closed with a 2-0 Vicryl suture  ligature.  The pelvis was then thoroughly irrigated with normal saline.  Again, the small bowel was run from the ligament of Treitz to the terminal  ileum and found to be patent without evidence of any abnormalities.  At this point, the midline fascia was closed with a running #1 PDS suture.  The skin was irrigated and closed with skin staples.  The patient tolerated  the procedure well.  All counts were correct at the end of the procedure.  The patient was then extubated in the operating room and taken in stable  condition to the recovery room.                                               Abigail Miyamoto, M.D.    DB/MEDQ  D:  07/11/2003  T:  07/12/2003  Job:  [id number]

## 2010-09-04 NOTE — H&P (Signed)
NAMESHAMICKA, INGA NO.:  [id number]   MEDICAL RECORD NO.:  [id number]                   PATIENT TYPE:  EMS   LOCATION:  ED                                   [facility]ACILITY[facility]   PHYSICIAN:  Abigail Miyamoto, M.D.              DATE OF BIRTH:  1929-09-04   DATE OF ADMISSION:  11/19/2002  DATE OF DISCHARGE:                                HISTORY & PHYSICAL   CHIEF COMPLAINT:  Abdominal pain, nausea, and vomiting.   HISTORY:  Ms. Pal is a very pleasant 77 year old patient of Wanda Plump,  MD [facility] who presents with less than 24 hours of crampy abdominal pain,  nausea, and vomiting.  She reports pain came on early this morning.  She did  have four bowel movements and also had nausea and throwing up.  She reports  the pain as crampy and diffuse in her abdomen.  Since coming to the  emergency room she has not passed flatus.  She denies any chest pain,  fevers, or shortness of breath.  She has had no dysuria.  She also denies  jaundice.   REVIEW OF SYSTEMS:  Otherwise unremarkable.   PAST MEDICAL HISTORY:  1. Hypercholesterolemia.  2. Leaky heart valves by her report.  3. Irregular heartbeat.   PAST SURGICAL HISTORY:  1. Tonsillectomy.  2. Pilonidal cystectomy.  3. Hysterectomy.  4. Appendectomy.  5. Surgery for endometriosis.  6. Bladder suspension.  7. Cholecystectomy.  8. Surgery on her right thumb.  9. Surgery on her back.  10.      Removal of skin cancers.   MEDICATIONS:  1. Protonix.  2. Etodolac.  3. Cenestin.  4. Lipitor.  5. Cartia XT.  6. Skelaxin.  7. Ultracet.  8. Analpram.   ALLERGIES:  She reports intolerance to multiple medications including  FLAGYL, CODEINE, VIOXX, IMITREX, CELEBREX, DAYPRO, NORTRIPTYLINE,  CARISOPRODOL, BEXTRA.   SOCIAL HISTORY:  She does not smoke.  She does not drink alcohol.   FAMILY HISTORY:  She currently lives alone.  She does not smoke, does not  drink alcohol.   REVIEW OF SYSTEMS:   Otherwise unremarkable.   PHYSICAL EXAMINATION:  GENERAL:  Well-developed, well-nourished female in no  acute distress.  VITAL SIGNS:  Temperature 97.1, blood pressure 143/87, heart rate 86,  respiratory rate 18.  HEENT:  Eyes:  She is anicteric.  Pupils are reactive bilaterally.  Ear,  nose, mouth, and throat:  Her external ears and nose are normal.  Hearing  appears normal.  Oropharynx is clear.  NECK:  Supple.  There is no adenopathy.  There is no thyromegaly.  LUNGS:  Clear to auscultation bilaterally with normal effort.  CARDIOVASCULAR:  Regular rate and rhythm without murmur.  There is no  peripheral edema.  ABDOMEN:  Mildly distended, soft.  There is mild to moderate tenderness  throughout.  There are no hernias.  No rebound or frank  peritonitis.  EXTREMITIES:  Warm and well perfused.   LABORATORY DATA:  The patient has an elevated white count of 13.5 with 91%  neutrophils, hemoglobin 13.8, hematocrit 46.2.  PT and PTT are normal.  She  has normal amylase and lipase, normal liver function tests.  BUN and  creatinine are 15 and 0.9.  X-rays:  The patient has abdominal x-rays  showing dilated loops of small bowel with positive air fluid levels.  There  is no free air.  There is air in the colon.   ASSESSMENT/PLAN:  This is a 77 year old female with a partial small bowel  obstruction based on abdominal x-rays and history.  I suspect this is  secondary to adhesions given her previous surgical history.  At this point  we will attempt conservative management with a nasogastric tube  decompression and bowel rest.  We will get x-rays in the morning and repeat  her laboratory data.  If she worsens or does not improve on x-ray  examination we will proceed to the operating room for exploratory  laparotomy.                                               Abigail Miyamoto, M.D.    DB/MEDQ  D:  11/19/2002  T:  11/20/2002  Job:  [id number]   cc:   Wanda Plump, MD [facility]  [phone] [address]  [place], [place] [address]

## 2010-09-04 NOTE — Discharge Summary (Signed)
   [name], [name]                         ACCOUNT NO.:  [id number]   MEDICAL RECORD NO.:  [id number]                   PATIENT TYPE:  INP   LOCATION:  0357                                 FACILITY:  [facility]   PHYSICIAN:  Abigail Miyamoto, M.D.              DATE OF BIRTH:  1930-03-17   DATE OF ADMISSION:  11/19/2002  DATE OF DISCHARGE:  11/23/2002                                 DISCHARGE SUMMARY   DISCHARGE DIAGNOSIS:  Small bowel obstruction.   OTHER DIAGNOSES:  1. Nausea.  2. History of hypercholesterolemia.   HISTORY:  [name] is a 77 year old female who presented to the  emergency department with a less the 24 hour history of crampy abdominal  pain, nausea and vomiting.  She had some bowel movements prior to admission.  She has had previous small bowel obstructions in the past.   PHYSICAL EXAMINATION:  Soft abdomen which was distended and with mild  diffuse tenderness.  She also has a mildly elevated white count, and  abdominal x-ray showing her to have distended loops of small bowel and air  fluid levels.   HOSPITAL COURSE:  The patient was admitted and a nasogastric tube was  placed.  The decision was made to proceed with conservative management and  IV rehydration.  Over the next several days she began feeling better.  Her  abdominal x-ray showed decrease in the bowel obstruction.  Fleet's enemas  did help clear stool out of the colon.  By her third hospital day, she began  having regular bowel movements and the x-ray showed the small bowel  obstruction to be resolving.  Therefore, the NG tube was removed.  She was  started on clear liquids which she tolerated well.  By November 23, 2002, she  was doing well.  She had no abdominal pain.  She was passing flatus.  Her  abdominal x-ray showed resolved obstruction.  Therefore, the decision was  made to discharge her home.   DISCHARGE DIET:  She will stay on liquids for several days.   DISCHARGE MEDICATIONS:  1. She will resume her home medications.  2. She will take milk of magnesia as needed for constipation.   She may shower.   She will follow up in my office in one week for re-check.                                               Abigail Miyamoto, M.D.    DB/MEDQ  D:  11/30/2002  T:  11/30/2002  Job:  [id number]

## 2010-09-04 NOTE — Discharge Summary (Signed)
NAME:  [name], [name]                       ACCOUNT NO.:  [id number]   MEDICAL RECORD NO.:  [id number]                   PATIENT TYPE:  INP   LOCATION:  0356                                 FACILITY:  [facility]   PHYSICIAN:  Rene Paci, M.D. [facility]          DATE OF BIRTH:  11/18/1929   DATE OF ADMISSION:  05/04/2003  DATE OF DISCHARGE:  05/06/2003                                 DISCHARGE SUMMARY   DISCHARGE DIAGNOSES:  1. Abdominal pain, nausea, vomiting, probable early small-bowel obstruction,     clinically resolved.  2. History of small-bowel obstruction, July 2004.  3. Hypertension.  4. Hypercholesterolemia.  5. History of gastroesophageal reflux disease.   DISCHARGE MEDICATIONS:  1. Protonix 40 mg p.o. daily.  2. Cenestin 0.3 mg p.o. daily.  3. Lipitor 40 mg p.o. daily.  4. Cartia XT 180 mg p.o. daily.  5. Darvocet or Ultram 1-2 p.o. q.4-6h. p.r.n. pain.  6. The patient is instructed to discontinue her Lodine as taken prior to     admission secondary to questionable history of ulcer disease per patient     report.  7. The patient is also instructed to hold lactulose when having diarrhea.   HOSPITAL FOLLOW-UP:  With her GI physician, Dr. Barnet Pall, [facility], as  previously scheduled for the end of this month.  She is also to contact her  primary care physician, Dr. Wanda Plump for follow-up in the next 2-3 months  or as previously scheduled.   CONDITION ON DISCHARGE:  Medically stable, tolerating p.o., minimized  abdominal pain.  Stable for discharge home.   HOSPITAL COURSE:  #1 - ABDOMINAL PAIN WITH NAUSEA AND VOMITING:  The patient  is a pleasant 77 year old white female who was having increased abdominal  distention with nausea and pain several days before admission and developed  vomiting on the day of admission.  A telephone call to her GI physician  recommended Mylanta over the telephone, but as the patient had no relief  with this she came to the emergency  room for evaluation of her pain.  Abdominal films were unremarkable but clinically by symptoms she was felt to  have an early partial small-bowel obstruction.  She was made n.p.o. for  several hours and then slowly advanced on clears which she tolerated without  problem.  Her pain was very well controlled with a single dose of Demerol  and then with Ultram as needed.  She was advanced to a regular diet and  within 48 hours of admission she felt back to her baseline with minimized  abdominal pain, resolution of her nausea, vomiting, and distention, though  still with occasional diarrhea which she reports is chronic.  She follows  with Dr. Arlyce Dice for these issues and has scheduled follow-up with him in  the next 2 weeks.  No surgical consultations were obtained as the patient  never developed full evidence of obstruction.  All of the patient's  other  medical issues remained stable during this hospitalization, and medications  were not changed except as listed above.                                               Rene Paci, M.D. [facility]    VL/MEDQ  D:  05/06/2003  T:  05/06/2003  Job:  [id number]

## 2010-09-04 NOTE — Discharge Summary (Signed)
NAME:  [name], [name]                       ACCOUNT NO.:  [id number]   MEDICAL RECORD NO.:  [id number]                   PATIENT TYPE:  INP   LOCATION:  5732                                 FACILITY:  [facility]   PHYSICIAN:  Abigail Miyamoto, M.D.              DATE OF BIRTH:  July 18, 1929   DATE OF ADMISSION:  07/08/2003  DATE OF DISCHARGE:  07/24/2003                                 DISCHARGE SUMMARY   DISCHARGE DIAGNOSES:  1. Small-bowel obstruction status post exploratory laparotomy and right     salpingo-oophorectomy for right ovarian cyst.  2. Atelectasis.  3. Phlebitis.   SUMMARY OF HISTORY:  [name] is a 77 year old female with a past  history of small-bowel obstruction with multiple episodes of nausea and  vomiting, which resolved and subsequently presented back with extended  abdomen.  She was seen by Dr. Melvia Heaps who ordered x-rays showing  abdominal distention.  She reported she was having crampy abdominal pain and  extension.  After seeing her in my office, decided to admit her to the  hospital.   HOSPITAL COURSE:  The patient was admitted.  A nasogastric tube was placed,  and she was started on IV rehydration.  Over the next several days, her  distention decreased.  She began having bowel movements and remained  nontender.  Given her multiple recurrent episodes, decision was made to  proceed to operating room for exploratory laparotomy.  She also had known  complex ovarian cyst, and decision made at the time to remove her ovaries.   The patient was taken to the operating room on July 11, 2003, where she  underwent exploratory laparotomy and right oophorectomy.  She was found to  have non-dilated bowel with minimal adhesions.  Postoperatively she was  taken to a regular surgical floor.  She remained there the rest of  hospitalization.   On postop day #1, her nasogastric tube was removed.  On postoperative day  #2, she was passing flatus.  She did have  a fever thought to be secondary to  atelectasis, and pulmonary toilet was initiated.  Over the next several  days, she did have some mild distention and postoperative ileus.  Her diet  was held at liquids, and Dulcolax suppositories were given .  On  postoperative day #5, her potassium was 3.7, creatinine 1.0.  A KUB did show  minimal atelectasis as well as an ileus.  The pathology just revealed a  benign cyst.  She continued to remain distended.  She had bowel movements,  so she was kept on liquids over the next several days.   By July 20, 2003, she continued to feel better, and her diet was advanced.  By July 22, 2003, she was tolerating a regular diet.  She was having some  mild hip pain, was having normal bowel movements.  I did add Toradol for her  pain.  We did check Dopplers  to rule out DVT.  Dopplers were negative.   By July 24, 2003, she was having multiple bowel movements.  Her abdomen was  soft and nontender.  She was tolerating a regular diet, and decision was  made to discharge her to home.   DISCHARGE DIET:  She will stay on a soft diet.   DISCHARGE ACTIVITY:  She is to do no heavy lifting greater than 20 pounds  for four weeks.   DISCHARGE WOUND CARE:  She may shower.   DISCHARGE MEDICATIONS:  She will resume her home medications. She will take  Vicodin and Advil for pain and Phenergan for nausea.   DISCHARGE FOLLOW UP:  She will follow up in my office in one week.                                                Abigail Miyamoto, M.D.    DB/MEDQ  D:  08/20/2003  T:  08/21/2003  Job:  [id number]

## 2010-09-04 NOTE — Discharge Summary (Signed)
NAMECLAUDELL, [name]             ACCOUNT NO.:  [id number]   MEDICAL RECORD NO.:  [id number]          PATIENT TYPE:  INP   LOCATION:  5729                         FACILITY:  [facility]   PHYSICIAN:  Iva Boop, M.D. LHCDATE OF BIRTH:  07-Feb-1930   DATE OF ADMISSION:  02/10/2005  DATE OF DISCHARGE:  02/11/2005                                 DISCHARGE SUMMARY   ADMISSION DIAGNOSES:  1.  Recurrent small bowel obstruction in a patient with history of      intermittent small bowel obstruction, status post exploratory laparotomy      in March 2005 with lysis of adhesions and removal of right ovarian cysts      by Dr. Rayburn Ma.  2.  Status post hysterectomy in 1964.  3.  Status post appendectomy in 1964.  4.  Exploratory surgery secondary to endometriosis.  5.  Status post bladder suspension in 1993.  6.  Status post cholecystectomy in 1994.  7.  Status post excision of skin cancers.  8.  Status post spinal laminectomy, lumbosacral spine, 1970.  9.  Status post tonsillectomy in the 1930s.  10. Status post right knee surgery.  11. Status post tendon surgery on the right thumb in 1998.  12. Status post removal of benign neck nodule.  13. History of hyperlipidemia.  14. Patient provided history of cardiac arrhythmia, though no electronic      documentation as to specific type.  15. Chronic constipation and intestinal hypomotility, status post multiple      screening colonoscopies.  16. History of TMJ syndrome.  17. Fibrocystic breast disease.  18. History of left carpal tunnel syndrome.  19. Multiple medication intolerances and possibly allergies. These include      METRONIDAZOLE, CODEINE, VIOXX, IMITREX, CELEBREX, DAYPRO, NORTRIPTYLINE,      BEXTRA, CARISOPRODOL, and MOBIC.   DISCHARGE DIAGNOSIS:  Small bowel obstruction, symptoms rapidly resolved.   CONSULTATIONS:  None.   PROCEDURES:  None.   BRIEF HISTORY:  [name] is a 77 year old lady who has had multiple small  bowel  obstructions that have generally been treated by the surgeons during  prior hospitalizations. She was seen in the office by Dr. Arlyce Dice on the 25th  of October with 12 hours of abdominal distention, pain and vomiting  consistent with bowel obstruction. X-ray confirmed a small bowel obstruction  and she was admitted for supportive care.   LABORATORIES:  Hemoglobin 12.5, hematocrit 37, MCV 97.2, platelet count  385,000. White blood cell count 9.3. Sodium 133, potassium 3.5, glucose 95,  BUN 17, creatinine 1.2. Chloride 100, CO2 26, calcium 8.9.  Urinalysis was  negative.   IMAGING STUDIES:  Initial acute abdominal series with chest film showed a  small bowel obstructive pattern with air-fluid levels, the small bowel and  decompressed colon. Also seen were mild bibasilar atelectasis. A follow-up  single view abdomen showed improving small bowel dilatation and gas noted in  non-dilated colon.   HOSPITAL COURSE:  The patient was admitted to the hospital. She was begged  not to have an NG tube placed and we complied with that. She was given a  tap  water enema that evening. It did not result in any bowel movement, but did  result in increased flatus. By the time she was admitted onto the floor, a  few hours after having been at Dr. Marzetta Board office, the nausea and pain was  somewhat improved and therefore we did hold off on placing the NG tube. Over  the course of that afternoon and into the evening, and into the following  morning, she had no recurrent nausea or vomiting. She was having some  persistent, albeit much diminished pain in the suprapubic region. She was  tolerating sips of clear liquids. Thus the NG tube was not required in order  to settle down her symptoms. Follow-up x-ray did confirm that the bowel  obstructive pattern was improving.   A urinalysis failed to confirm any evidence for a urinary tract infection.   DIET:  Advanced to full liquid and then to a heart healthy diet,  all of  which she tolerated. She was supported with IV fluids, but did not require  significant use of available antiemetics and IV analgesics.   The patient was in stable condition. Her symptoms had continued to improve  and was pretty much resolved by the afternoon of the 26th of October. She  felt that she was ready to go home and Dr. Leone Payor discharged her home. She  was advised to call Dr. Marzetta Board office for a follow-up appointment within  the next two to four weeks, which would be late November.   MEDICATIONS AT DISCHARGE:  1.  Lodine 500 mg daily.  2.  Xanax 0.5 mg p.o. q.h.s.  3.  Lipitor 40 mg daily.  4.  Atenolol 50 mg one-half tablet daily.  5.  Premarin 0.3 mg daily.  6.  MiraLax 17 ounces once or two daily.  7.  Darvocet one to two tablets up to four times a day as needed.  8.  Citrucel one dose daily.  9.  Protonix 40 mg daily. This replaces Prilosec.  10. Calcium with vitamin D 500 mg t.i.d.      Jennye Moccasin, P.A. [facility]      Iva Boop, M.D. [facility]  Electronically Signed    SG/MEDQ  D:  03/30/2005  T:  03/31/2005  Job:  [id number]   cc:   Wanda Plump, MD [facility]  [phone] [address] [place], [place] [address]

## 2010-09-10 ENCOUNTER — Ambulatory Visit (HOSPITAL_COMMUNITY): Payer: Medicare Other | Attending: Internal Medicine | Admitting: Radiology

## 2010-09-10 DIAGNOSIS — R0600 Dyspnea, unspecified: Secondary | ICD-10-CM

## 2010-09-10 DIAGNOSIS — I059 Rheumatic mitral valve disease, unspecified: Secondary | ICD-10-CM | POA: Insufficient documentation

## 2010-09-10 DIAGNOSIS — R0602 Shortness of breath: Secondary | ICD-10-CM

## 2010-09-10 DIAGNOSIS — E785 Hyperlipidemia, unspecified: Secondary | ICD-10-CM | POA: Insufficient documentation

## 2010-09-10 DIAGNOSIS — I10 Essential (primary) hypertension: Secondary | ICD-10-CM | POA: Insufficient documentation

## 2010-09-10 DIAGNOSIS — I079 Rheumatic tricuspid valve disease, unspecified: Secondary | ICD-10-CM | POA: Insufficient documentation

## 2010-09-21 ENCOUNTER — Encounter: Payer: Self-pay | Admitting: Internal Medicine

## 2010-09-21 ENCOUNTER — Ambulatory Visit (INDEPENDENT_AMBULATORY_CARE_PROVIDER_SITE_OTHER): Payer: Medicare Other | Admitting: Internal Medicine

## 2010-09-21 DIAGNOSIS — R0600 Dyspnea, unspecified: Secondary | ICD-10-CM

## 2010-09-21 DIAGNOSIS — M2550 Pain in unspecified joint: Secondary | ICD-10-CM

## 2010-09-21 DIAGNOSIS — R0609 Other forms of dyspnea: Secondary | ICD-10-CM

## 2010-09-21 DIAGNOSIS — I10 Essential (primary) hypertension: Secondary | ICD-10-CM

## 2010-09-21 DIAGNOSIS — G43909 Migraine, unspecified, not intractable, without status migrainosus: Secondary | ICD-10-CM | POA: Insufficient documentation

## 2010-09-21 NOTE — Assessment & Plan Note (Signed)
micardis was decreased, BP remains at goal. No change

## 2010-09-21 NOTE — Progress Notes (Signed)
  Subjective:    Patient ID: [name], female    DOB: 04/02/1930, 77 y.o.   MRN: [id number]  HPI Followup from last office visit, she was seen with shortness of breath, workup negative. Overall feels about the same, slightly better?.  Past Medical History  Diagnosis Date  . Hyperlipidemia   . Hypertension   . Anxiety and depression   . Pulmonary nodules     Wert. First seen by CT only 05/04/03, no change on f/u 08/08/06 -Macroscopic changes rml 04/27/07 > resolved april 6,2010 no further w/u   . Knee pain     right chronic  . Back pain     lumbar  . SVT (supraventricular tachycardia)     sees Dr.Ross  . Hydronephrosis     Right 2008 s/p urology eval CT/cysto: observation/monitor  . Headache   . GI (gastrointestinal bleed)     hx of colon polyps 2004, diverticulosis, internal hemmorhids   Past Surgical History  Procedure Date  . Abdominal hysterectomy   . Rotator cuff repair 9/11  . Total knee arthroplasty     right  . Cataract extraction 2008    Dr.Digby  . R ovary removed   . Lumbar reconstructive surgery 01/07/2009  . Appendectomy   . Cholecystectomy   . Shoulder surgery     lefet  . Tonsillectomy   . Bladder repair       Review of Systems Developed bilateral-posterior  neck pain, right shoulder pain ~2 days ago. Denies fevers, headaches, injury. No bladder or bowel incontinence No paresthesias although she has some tingling in the feet and hand since she started Topamax    Objective:   Physical Exam Alert, oriented, no apparent distress Lungs clear consultation bilateral Range of motion of the neck slightly limited due to pain. Palpation of the cervical spine causes no pain. Palpation of the shoulders and trapezoid areas caused no distress, actually made the pain feel better. Shoulder range of motion normal Neurological exam extremities his strength and DTRs normal. No lower extremity edema        Assessment & Plan:

## 2010-09-21 NOTE — Assessment & Plan Note (Signed)
Sx well controlled at pressent , actually better w/  topamax, f/u w/ Dr Neale Burly

## 2010-09-21 NOTE — Assessment & Plan Note (Signed)
Long h/o chronic pain (back, knee) Presents today w/ neck pain, no red flag sx Recent CBC normal Plans to see ortho: agree, to call me if F, HA

## 2010-09-21 NOTE — Assessment & Plan Note (Signed)
See previous entry, w/u neg;deconditioning?   pt wonders if needs to see pulmonary instead of cards; I agreed to refer to Pulmonary

## 2010-09-25 ENCOUNTER — Ambulatory Visit (INDEPENDENT_AMBULATORY_CARE_PROVIDER_SITE_OTHER): Payer: Medicare Other | Admitting: Internal Medicine

## 2010-09-25 ENCOUNTER — Encounter: Payer: Self-pay | Admitting: Internal Medicine

## 2010-09-25 DIAGNOSIS — J984 Other disorders of lung: Secondary | ICD-10-CM

## 2010-09-25 DIAGNOSIS — R05 Cough: Secondary | ICD-10-CM | POA: Insufficient documentation

## 2010-09-25 DIAGNOSIS — R059 Cough, unspecified: Secondary | ICD-10-CM | POA: Insufficient documentation

## 2010-09-25 DIAGNOSIS — R51 Headache: Secondary | ICD-10-CM

## 2010-09-25 NOTE — Progress Notes (Deleted)
Subjective:     Patient ID: [name], female   DOB: Dec 24, 1929, 77 y.o.   MRN: [id number]  HPI       - Assessment & Plan Note           Review of Systems     Objective:   Physical Exam     Assessment:     ***    Plan:     ***

## 2010-09-25 NOTE — Progress Notes (Signed)
Subjective:     Patient ID: [name], female   DOB: 1929-08-05, 77 y.o.   MRN: [id number]  HPI  75 yowf  never smoker in for bilateral pulmonary nodules documented since 2005 and two left lower lobe nodules that could only be seen on CT scan dated April of 2008.   April 26, 2008 ov with indolent onset intermittent new cough assoc with nasal congestion and sensaton of pnds with some itching sneezing but no wheezing some better with clariton. Imp was upper airway cough syndrome rec  1) try zyrtec at bedtime to see if that works better for your cough  2) GERD diet 4) Delsym works great for cough  5) next step is to add prilosec for up to 6 weeks Take one 30-60 min before first meal of the day  6) CHANGE FOLLOW UP TO 3 MONTHS WITH CXR   Improved so did not return  09/25/2010 ov/Thaxton Pelley  Cc cough recurred off zyrtec x 3 months due to concern re ha  But w/in a week of so of stopping zyrtec cough all day,  Mostly dry, does not awaken her at bedtime or exac early in am, mild nasal congestion but no purulent nasal secretions.  Pt denies any significant sore throat, dysphagia, itching, sneezing,  fever, chills, sweats, unintended wt loss, pleuritic or exertional cp, hempoptysis, orthopnea pnd or leg swelling.    Also denies any obvious fluctuation of symptoms with weather or environmental changes or other aggravating or alleviating factors.      Current Allergies   ! CODEINE  ! NORTRIPTYLINE HCL  ! AMITRIPTYLINE HCL  ! CARISOPRODOL  ! * BEXTRA  ! DAYPRO  ! IMITREX  ! MOBIC  ! VIOXX  ! PHENERGAN  ! METRONIDAZOLE   Past Medical History:  HYPERLIPIDEMIA  HTN  ANXIETY and DEPRESSION  PULMONARY NODULES.......................................................................Marland KitchenWert  - First seen by CT only 05/04/03, no change on f/u 08/08/06 and nl cxr 08/28/10 - Macroscopic changes rml 04/27/07  KNEE PAIN, RIGHT, CHRONIC  h/o SVT--sees Dr Clarene Reamer PAIN, LUMBAR, CHRONIC  H/O  HYDRONEPHROSIS, RIGHT 2008 s/p urology eval/CT/cysto: observation/monitor  Headache .............................................................Marland KitchenNeale Burly  Family History:  colon ca--no  breast ca-- mother , late in life  MI--no  father had lung cancer CORRECTION apparently had TB  DM--M late in life  bladder ca--Brother    Social History:   widow  1 son  lives by herself  Patient never smoked.          Review of Systems     Objective:   Physical Exam Ambulatory healthy appearing in no acute distress.  Afeb with normal vital signs wt 157 > 157 April 26, 2008 > 157  09/25/2010  HEENT: nl dentition, turbinates, and orophanx. Nl external ear canals without cough reflex  Neck without JVD/Nodes/TM  Lungs clear to A and P bilaterally without cough on insp or exp maneuvers  RRR no s3 or murmur or increase in P2  Abd soft and benign with nl excursion in the supine position. No bruits or organomegaly  Ext warm without calf tenderness, cyanosis clubbing or edema  Skin warm and dry without lesions       Comparison: 01/09/2009 and earlier.  Findings: Interval postoperative changes to the lumbar spine.  Chronic mild scarring or atelectasis at the lung bases, more so on  the right. Stable lung volumes and clear lungs otherwise. No  pneumothorax, pulmonary edema or pleural effusion. Stable mild  cardiomegaly. Other mediastinal contours are within  normal limits.  Chronic degenerative or post traumatic deformity to the distal  right clavicle. Surgical clips the right upper quadrant.  IMPRESSION:  No acute cardiopulmonary abnormality.  Assessment:         Plan:

## 2010-09-25 NOTE — Assessment & Plan Note (Signed)
Cxr reviewed from 08/28/10 > no nodules,  No further f/u planned

## 2010-09-25 NOTE — Assessment & Plan Note (Signed)
Classic Upper airway cough syndrome, so named because it's frequently impossible to sort out how much is  CR/sinusitis with freq throat clearing (which can be related to primary GERD)   vs  causing  secondary (" extra esophageal")  GERD from wide swings in gastric pressure that occur with throat clearing, often  promoting self use of mint and menthol lozenges that reduce the lower esophageal sphincter tone and exacerbate the problem further in a cyclical fashion.   These are the same pts who not infrequently have failed to tolerate ace inhibitors,  dry powder inhalers or biphosphonates or report having reflux symptoms that don't respond to standard doses of PPI , and are easily confused as having aecopd or asthma flares,   For now makes the most sense to restart Zyrtec to see if HA worse and if so return for alternative rx.  Genella Rife rx also reviewed as this is frequently assoc with UACS though not necessarily the cause  F/u can be prn

## 2010-09-25 NOTE — Patient Instructions (Signed)
1) try zyrtec at bedtime to see if that works better for your cough  2) GERD (REFLUX)  is an extremely common cause of respiratory symptoms, many times with no significant heartburn at all.    It can be treated with medication, but also with lifestyle changes including avoidance of late meals, excessive alcohol, smoking cessation, and avoid fatty foods, chocolate, peppermint, colas, red wine, and acidic juices such as orange juice.  NO MINT OR MENTHOL PRODUCTS SO NO COUGH DROPS  USE SUGARLESS CANDY INSTEAD (jolley ranchers or Stover's)  NO OIL BASED VITAMINS  4) Delsym works great for cough  5) Acid reflux is to cough what oxygen is to fire so take prilosec Take 30-60 min before first meal of the day until cough is completely better for at least a couple of weeks   If you are satisfied with your treatment plan let your doctor know and he/she can either refill your medications or you can return here when your prescription runs out.     If in any way you are not 100% satisfied,  please tell us.  If 100% better, tell your friends!

## 2010-09-25 NOTE — Assessment & Plan Note (Signed)
Strongly doubt zyrtec contributing to the pattern/ severity/frequency of headaches but if worsens on this next step from our perspective is trial of chlortrimeton on nasal steroids vs astepro

## 2010-10-01 ENCOUNTER — Other Ambulatory Visit: Payer: Self-pay | Admitting: Internal Medicine

## 2010-10-12 ENCOUNTER — Encounter: Payer: Medicare Other | Admitting: Internal Medicine

## 2010-10-26 ENCOUNTER — Ambulatory Visit: Payer: Medicare Other | Admitting: Internal Medicine

## 2010-10-27 ENCOUNTER — Encounter: Payer: Medicare Other | Admitting: Internal Medicine

## 2010-11-16 ENCOUNTER — Telehealth: Payer: Self-pay | Admitting: *Deleted

## 2010-11-16 NOTE — Telephone Encounter (Signed)
I spoke w/ pt she states that Dr.Freeman gave her the results of the NCS and said he had no further recommendations. She has been using gabapentin-- she states that it helps for the most part.

## 2010-11-16 NOTE — Telephone Encounter (Signed)
THX!

## 2010-11-19 ENCOUNTER — Other Ambulatory Visit: Payer: Self-pay | Admitting: Internal Medicine

## 2010-11-23 ENCOUNTER — Encounter (HOSPITAL_BASED_OUTPATIENT_CLINIC_OR_DEPARTMENT_OTHER): Payer: Self-pay | Admitting: *Deleted

## 2010-11-23 ENCOUNTER — Emergency Department (INDEPENDENT_AMBULATORY_CARE_PROVIDER_SITE_OTHER): Payer: Medicare Other

## 2010-11-23 ENCOUNTER — Encounter: Payer: Self-pay | Admitting: Family

## 2010-11-23 ENCOUNTER — Other Ambulatory Visit: Payer: Self-pay

## 2010-11-23 ENCOUNTER — Emergency Department (HOSPITAL_BASED_OUTPATIENT_CLINIC_OR_DEPARTMENT_OTHER)
Admission: EM | Admit: 2010-11-23 | Discharge: 2010-11-23 | Disposition: A | Discharge status: Other Acute Inpatient Hospital | Payer: Medicare Other | Source: Home / Self Care | Attending: Emergency Medicine | Admitting: Emergency Medicine

## 2010-11-23 ENCOUNTER — Telehealth: Payer: Self-pay | Admitting: Internal Medicine

## 2010-11-23 ENCOUNTER — Ambulatory Visit (INDEPENDENT_AMBULATORY_CARE_PROVIDER_SITE_OTHER): Payer: Medicare Other | Admitting: Family

## 2010-11-23 ENCOUNTER — Inpatient Hospital Stay (HOSPITAL_COMMUNITY)
Admission: AD | Admit: 2010-11-23 | Discharge: 2010-11-25 | DRG: 312 | Disposition: A | Discharge status: Home / Self Care | Payer: Medicare Other | Source: Other Acute Inpatient Hospital | Attending: Family Medicine | Admitting: Family Medicine

## 2010-11-23 DIAGNOSIS — I959 Hypotension, unspecified: Secondary | ICD-10-CM | POA: Insufficient documentation

## 2010-11-23 DIAGNOSIS — I10 Essential (primary) hypertension: Secondary | ICD-10-CM | POA: Diagnosis present

## 2010-11-23 DIAGNOSIS — R51 Headache: Secondary | ICD-10-CM

## 2010-11-23 DIAGNOSIS — E876 Hypokalemia: Secondary | ICD-10-CM | POA: Diagnosis present

## 2010-11-23 DIAGNOSIS — R110 Nausea: Secondary | ICD-10-CM | POA: Insufficient documentation

## 2010-11-23 DIAGNOSIS — E785 Hyperlipidemia, unspecified: Secondary | ICD-10-CM | POA: Insufficient documentation

## 2010-11-23 DIAGNOSIS — R42 Dizziness and giddiness: Secondary | ICD-10-CM | POA: Insufficient documentation

## 2010-11-23 DIAGNOSIS — T465X5A Adverse effect of other antihypertensive drugs, initial encounter: Secondary | ICD-10-CM | POA: Diagnosis present

## 2010-11-23 DIAGNOSIS — Z79899 Other long term (current) drug therapy: Secondary | ICD-10-CM | POA: Insufficient documentation

## 2010-11-23 DIAGNOSIS — D649 Anemia, unspecified: Secondary | ICD-10-CM | POA: Diagnosis present

## 2010-11-23 DIAGNOSIS — Z96659 Presence of unspecified artificial knee joint: Secondary | ICD-10-CM

## 2010-11-23 DIAGNOSIS — I9589 Other hypotension: Principal | ICD-10-CM | POA: Diagnosis present

## 2010-11-23 DIAGNOSIS — F341 Dysthymic disorder: Secondary | ICD-10-CM | POA: Diagnosis present

## 2010-11-23 DIAGNOSIS — R609 Edema, unspecified: Secondary | ICD-10-CM | POA: Insufficient documentation

## 2010-11-23 DIAGNOSIS — M25569 Pain in unspecified knee: Secondary | ICD-10-CM | POA: Insufficient documentation

## 2010-11-23 LAB — COMPREHENSIVE METABOLIC PANEL
ALT: 7 U/L (ref 0–35)
CO2: 22 mEq/L (ref 19–32)
Calcium: 9.6 mg/dL (ref 8.4–10.5)
Creatinine, Ser: 1 mg/dL (ref 0.50–1.10)
GFR calc Af Amer: >60 mL/min (ref >60)
GFR calc non Af Amer: 54 mL/min — ABNORMAL LOW (ref >60)
Glucose, Bld: 100 mg/dL — ABNORMAL HIGH (ref 70–99)

## 2010-11-23 LAB — TROPONIN I: Troponin I: <0.3 ng/mL (ref <0.30)

## 2010-11-23 LAB — DIFFERENTIAL
Eosinophils Relative: 3 % (ref 0–5)
Lymphocytes Relative: 26 % (ref 12–46)
Lymphs Abs: 1.5 10*3/uL (ref 0.7–4.0)
Monocytes Absolute: 0.6 10*3/uL (ref 0.1–1.0)
Monocytes Relative: 11 % (ref 3–12)

## 2010-11-23 LAB — URINALYSIS, ROUTINE W REFLEX MICROSCOPIC
Glucose, UA: NEGATIVE mg/dL
Ketones, ur: NEGATIVE mg/dL
Leukocytes, UA: NEGATIVE
Specific Gravity, Urine: 1.013 (ref 1.005–1.030)
pH: 6 (ref 5.0–8.0)

## 2010-11-23 LAB — CBC
HCT: 34.9 % — ABNORMAL LOW (ref 36.0–46.0)
Hemoglobin: 11.6 g/dL — ABNORMAL LOW (ref 12.0–15.0)
MCV: 96.9 fL (ref 78.0–100.0)
RBC: 3.6 MIL/uL — ABNORMAL LOW (ref 3.87–5.11)
WBC: 5.7 10*3/uL (ref 4.0–10.5)

## 2010-11-23 LAB — PHOSPHORUS: Phosphorus: 4.1 mg/dL (ref 2.3–4.6)

## 2010-11-23 MED ORDER — SODIUM CHLORIDE 0.9 % IV BOLUS (SEPSIS)
500.0000 mL | Freq: Once | INTRAVENOUS | Status: AC
  Administered 2010-11-23: 500 mL via INTRAVENOUS

## 2010-11-23 MED ORDER — SODIUM CHLORIDE 0.9 % IV SOLN: Freq: Once | INTRAVENOUS | Status: DC

## 2010-11-23 NOTE — ED Notes (Signed)
Pt sts she has "felt swimmy headed since Saturday" and c/o posterior headache 5/10. Pt denies visual disturbances.

## 2010-11-23 NOTE — Assessment & Plan Note (Signed)
77 yr old female with 3 day history of dizziness, nausea and hypotension.  She appears clinically dehydrated.  She will need iv hydration and evaluation for any possible underlying source of infection as her hypotension could be due to an early sepsis picture.  She has abdominal bloating an a history of hydronephrosis, so a GU etiology is possible although she is asymptomatic for a urinary standpoint.  She does not have any transportation available.  I have advised her to go to the ED downstairs.  Report was provided to Dr. Preston Fleeting- covering MD in the ER.  25 minutes spent with patient and on coordination of care.

## 2010-11-23 NOTE — ED Notes (Signed)
Report given to Celso Sickle at [facility] bed [phone]. [facility] arrived department and are readying pt for transport.

## 2010-11-23 NOTE — Progress Notes (Signed)
Subjective:    Patient ID: [name], female    DOB: Feb 28, 1930, 77 y.o.   MRN: [id number]  HPI  Ms. Sellin is a 77 yr old female who presents today with chief complaint of hypotension.  Pt reports that she has not taken any blood pressure medication in 3 days due to hypotension and dizziness.  She reports associated + nausea x 3 days.    She took one zofran several hours ago without improvement.  Denies vomitting.  She had diarrhea on Friday but none since.  Denies fever, dysuria, urinary frequency, abdominal pain or low back pain.  She does have + HA.  Denies sick contacts.  She reports that she is appetite is fair and drinking (sips of water).  Denies chest pain or shortness of breath.    Review of Systems See HPI  Past Medical History  Diagnosis Date  . Hyperlipidemia   . Hypertension   . Anxiety and depression   . Pulmonary nodules     Wert. First seen by CT only 05/04/03, no change on f/u 08/08/06 -Macroscopic changes rml 04/27/07 > resolved april 6,2010 no further w/u   . Knee pain     right chronic  . Back pain     lumbar  . SVT (supraventricular tachycardia)     sees Dr.Ross  . Hydronephrosis     Right 2008 s/p urology eval CT/cysto: observation/monitor  . Headache   . GI (gastrointestinal bleed)     hx of colon polyps 2004, diverticulosis, internal hemmorhids    History   Social History  . Marital Status: Widowed    Spouse Name: N/A    Number of Children: N/A  . Years of Education: N/A   Occupational History  . Not on file.   Social History Main Topics  . Smoking status: Never Smoker   . Smokeless tobacco: Never Used  . Alcohol Use: No  . Drug Use: Not on file  . Sexually Active: Not on file   Other Topics Concern  . Not on file   Social History Narrative   Moved to a retirement home 05/2008 [place], has 3 meals a day, has a cleaning service    Past Surgical History  Procedure Date  . Abdominal hysterectomy   . Rotator cuff repair 9/11    . Total knee arthroplasty     right  . Cataract extraction 2008    Dr.Digby  . R ovary removed   . Lumbar reconstructive surgery 01/07/2009  . Appendectomy   . Cholecystectomy   . Shoulder surgery     lefet  . Tonsillectomy   . Bladder repair     Family History  Problem Relation Age of Onset  . Colon cancer Neg Hx   . Breast cancer Mother     later in life  . Lung cancer Father     apparently had TB  . Diabetes Mother     late in life  . Heart disease Mother   . Heart disease Father   . Prostate cancer Father   . Colitis Brother     Allergies  Allergen Reactions  . Amitriptyline Hcl     REACTION: hallucinations  . Carisoprodol     REACTION: nightmares,rash,tachycardia  . Codeine     REACTION: "deathly ill" nauesa, dizzy,weak  . Meloxicam     REACTION: sensitive to sun; facial redness; may cause severe abd pain  . Metronidazole     REACTION: abd pain  . Nortriptyline  Hcl     REACTION: hallucinations  . Oxaprozin     REACTION: abd pain  . Promethazine Hcl     REACTION: spastic arms and legs flailing while awake and unusual behavior while sleep - walking all over house during night  . Rofecoxib     REACTION: rash  . Sumatriptan     REACTION: rash    Current Outpatient Prescriptions on File Prior to Visit  Medication Sig Dispense Refill  . aspirin 81 MG tablet Take 81 mg by mouth daily.        Marland Kitchen atorvastatin (LIPITOR) 40 MG tablet TAKE 1 TABLET BY MOUTH EVERY NIGHT AT BEDTIME  90 tablet  1  . BEPREVE 1.5 % SOLN as needed.      . Calcium Carbonate-Vitamin D (CALCIUM 600+D) 600-400 MG-UNIT per tablet Take 1 tablet by mouth daily.        Jennette Banker Sodium 30-100 MG CAPS Take by mouth.        . diltiazem (CARDIZEM CD) 180 MG 24 hr capsule Take 180 mg by mouth daily.        Marland Kitchen gabapentin (NEURONTIN) 100 MG capsule 3 at bedtime.       . Ginkgo Biloba 120 MG TABS Take by mouth daily.        Marland Kitchen lidocaine (LIDODERM) 5 % Place 1 patch onto the skin daily.  Remove & Discard patch within 12 hours or as directed by MD       . methocarbamol (ROBAXIN) 500 MG tablet Take 1 tablet by mouth Every 8 hours.      . Misc Natural Products (OSTEO BI-FLEX ADV DOUBLE ST PO) Take by mouth.        . multivitamin (THERAGRAN) per tablet Take 1 tablet by mouth daily.        Marland Kitchen PREMARIN 0.3 MG tablet TAKE 1 TABLET BY MOUTH DAILY  90 tablet  0  . topiramate (TOPAMAX) 100 MG tablet Take 100 mg by mouth daily.        Marland Kitchen MICARDIS 40 MG tablet TAKE 1 TABLET BY MOUTH EVERY DAY  90 tablet  1    BP 86/56  Pulse 66  Temp(Src) 97.6 F (36.4 C) (Oral)  Resp 16  Wt 159 lb 0.6 oz (72.14 kg)       Objective:   Physical Exam  Constitutional: She is oriented to person, place, and time.       Pale appearing elderly woman- looks tired and weak but in NAD.  HENT:  Head: Normocephalic and atraumatic.  Cardiovascular: Normal rate, regular rhythm, S1 normal and S2 normal.   No murmur heard. Pulmonary/Chest: Effort normal and breath sounds normal. No respiratory distress. She has no wheezes. She has no rales. She exhibits no tenderness.  Abdominal: She exhibits distension. There is no tenderness. There is no guarding.       Hyperactive bowel sounds   Musculoskeletal: She exhibits no edema.  Neurological: She is alert and oriented to person, place, and time. Coordination normal.       Steady even gait.  Skin: Skin is warm and dry.  Psychiatric: She has a normal mood and affect. Her behavior is normal. Judgment and thought content normal.          Assessment & Plan:

## 2010-11-23 NOTE — Patient Instructions (Signed)
Please go directly downstairs to the ED- they are expecting you.  Continue to hold your micardis until further instructions. Follow up with Dr. Drue Novel after you are sent home from the ED/hospital.

## 2010-11-23 NOTE — Telephone Encounter (Signed)
spoke with patient and she stated her BP has been 112/88 and she stated she feels like it is too low since she is  having dizziness, she wanted to be seen today if possible...Marland KitchenMarland KitchenScheduled appt with Melissa today at 3:15 to evaluate the patients BP.    KP

## 2010-11-23 NOTE — ED Notes (Signed)
IV was charted "removed" per documentation purposes only. IV is infact intact upon transfer to [facility].

## 2010-11-23 NOTE — ED Notes (Signed)
Sent from her MD's office with hypotension. Pt c.o headache, dizzy and nauseated x 2 days.

## 2010-11-23 NOTE — ED Provider Notes (Signed)
History    Scribed for Forbes Cellar, MD, the patient was seen in room MH07/MH07. This chart was scribed by Clarita Crane. This patient's care was started at 4:58PM.  CSN: [id number] Arrival date & time: 11/23/2010  4:24 PM  Chief Complaint  Patient presents with  . Hypotension   HPI Patient is a female referred to the ED by PCP with c/o hypotension, constant dizziness described as lightheadedness, nausea and HA which she localizes to occipital region onset 2 days ago and persistent since with associated mild weakness and mild congestion which she attributes to allergies. Denies vomiting, chest pain, SOB, fever, chills, abdominal pain, diarrhea, dysuria, urinary frequency. States current symptoms are similar to those experienced with previous episodes of hypertension and hypotension. Patient repots her blood pressure is usually measured in 120's/50's but at today's visit blood pressure was measured significantly lower than normal. Reports she has been compliant with medications and denies any recent changes in medications. States she is careful taking appropriate amount of her medications. Reports h/o multiple back surgeries, left knee replacement, abdominal hysterectomy, rotator cuff repair, cholecystectomy, hyperlipidemia, anxiety. States she is an occasional drinker, non-smoker and denies drug abuse.  PCP- Dr.Paz- [place]  PAST MEDICAL HISTORY:  Past Medical History  Diagnosis Date  . Hyperlipidemia   . Hypertension   . Anxiety and depression   . Pulmonary nodules     [place]ert. First seen by CT only 05/04/03, no change on f/u 08/08/06 -Macroscopic changes rml 04/27/07 > resolved april 6,2010 no further w/u   . Knee pain     right chronic  . Back pain     lumbar  . SVT (supraventricular tachycardia)     sees Dr.Ross  . Hydronephrosis     Right 2008 s/p urology eval CT/cysto: observation/monitor  . Headache   . GI (gastrointestinal bleed)     hx of colon polyps 2004, diverticulosis, internal  hemmorhids    PAST SURGICAL HISTORY:  Past Surgical History  Procedure Date  . Abdominal hysterectomy   . Rotator cuff repair 9/11  . Total knee arthroplasty     right  . Cataract extraction 2008    Dr.Digby  . R ovary removed   . Lumbar reconstructive surgery 01/07/2009  . Appendectomy   . Cholecystectomy   . Shoulder surgery     lefet  . Tonsillectomy   . Bladder repair     MEDICATIONS:  Previous Medications   ASPIRIN 81 MG TABLET    Take 81 mg by mouth daily.     ATORVASTATIN (LIPITOR) 40 MG TABLET    TAKE 1 TABLET BY MOUTH EVERY NIGHT AT BEDTIME   BEPREVE 1.5 % SOLN    Place 1 drop into both eyes as needed. Itchy eyes   CALCIUM CARBONATE-VITAMIN D (CALCIUM 600+D) 600-400 MG-UNIT PER TABLET    Take 1.5 tablets by mouth 2 (two) times daily.    CASANTHRANOL-DOCUSATE SODIUM 30-100 MG CAPS    Take 2 capsules by mouth daily.    CETIRIZINE (ZYRTEC) 10 MG TABLET    Take 10 mg by mouth daily.     DILTIAZEM (CARDIZEM CD) 180 MG 24 HR CAPSULE    Take 180 mg by mouth daily.     GABAPENTIN (NEURONTIN) 100 MG CAPSULE    Take 200-300 mg by mouth 3 (three) times daily as needed.    GINKGO BILOBA 120 MG TABS    Take 1 tablet by mouth daily.    LIDOCAINE (LIDODERM) 5 %    Place  1 patch onto the skin daily. Remove & Discard patch within 12 hours or as directed by MD    METHOCARBAMOL (ROBAXIN) 500 MG TABLET    Take 500 mg by mouth 2 (two) times daily.    MICARDIS 40 MG TABLET    TAKE 1 TABLET BY MOUTH EVERY DAY   MISC NATURAL PRODUCTS (OSTEO BI-FLEX ADV DOUBLE ST PO)    Take 1 capsule by mouth daily.    MULTIVITAMIN (THERAGRAN) PER TABLET    Take 1 tablet by mouth daily.     PREMARIN 0.3 MG TABLET    TAKE 1 TABLET BY MOUTH DAILY   SODIUM CHLORIDE (OCEAN) 0.65 % NASAL SPRAY    Place 1 spray into the nose as needed. Dry nose    TOPIRAMATE (TOPAMAX) 100 MG TABLET    Take 100 mg by mouth at bedtime.      ALLERGIES:  Allergies as of 11/23/2010 - Review Complete 11/23/2010  Allergen Reaction  Noted  . Amitriptyline hcl  10/07/2006  . Carisoprodol  10/07/2006  . Codeine  10/07/2006  . Meloxicam  10/07/2006  . Metronidazole  10/07/2006  . Nortriptyline hcl  10/07/2006  . Oxaprozin  10/07/2006  . Promethazine hcl  10/07/2006  . Rofecoxib  10/07/2006  . Sumatriptan  10/07/2006     FAMILY HISTORY:  Family History  Problem Relation Age of Onset  . Colon cancer Neg Hx   . Breast cancer Mother     later in life  . Lung cancer Father     apparently had TB  . Diabetes Mother     late in life  . Heart disease Mother   . Heart disease Father   . Prostate cancer Father   . Colitis Brother      SOCIAL HISTORY: History   Social History  . Marital Status: Widowed    Spouse Name: N/A    Number of Children: N/A  . Years of Education: N/A   Social History Main Topics  . Smoking status: Never Smoker   . Smokeless tobacco: Never Used  . Alcohol Use: No  . Drug Use: None  . Sexually Active: None   Other Topics Concern  . None   Social History Narrative   Moved to a retirement home 05/2008 [place], has 3 meals a day, has a cleaning service     Review of Systems 10 Systems reviewed and are negative for acute change except as noted in the HPI.  Physical Exam  BP 135/59  Pulse 57  Temp(Src) 97.7 F (36.5 C) (Oral)  Resp 20  SpO2 98%  Physical Exam  Nursing note and vitals reviewed. Constitutional: She is oriented to person, place, and time. She appears well-developed and well-nourished.       Bradycardic.   HENT:  Head: Normocephalic and atraumatic.       Mm dry  Eyes: Conjunctivae are normal. Pupils are equal, round, and reactive to light.  Neck: Neck supple.  Cardiovascular: Normal rate and regular rhythm.  Exam reveals no gallop and no friction rub.   No murmur heard. Pulmonary/Chest: Effort normal and breath sounds normal. She has no wheezes. She has no rales.       No rhonchi  Abdominal: Soft. Bowel sounds are normal. She exhibits no  distension. There is no tenderness. There is no rebound and no guarding.  Musculoskeletal: Normal range of motion. She exhibits edema (1+ pitting, bilaterally).  Neurological: She is alert and oriented to person, place, and time.  No cranial nerve deficit or sensory deficit. Coordination normal.       No pronator drift. No facial droop.   Skin: Skin is warm and dry. There is pallor.  Psychiatric: She has a normal mood and affect. Her behavior is normal.    ED Course  Procedures  OTHER DATA REVIEWED: Nursing notes, vital signs, and past medical records reviewed. Previous medical records reviewed and considered All labs/vitals reviewed and considered  MRI HEAD WITHOUT CONTRAST  (Performed on 07/13/2010 and read by Dr. Dineen Kid. Clark) Comparison: MRI 04/26/2004  Findings: Ventricle size is normal. Mild hyperintensity in the  frontal white matter bilaterally is unchanged and likely related to  chronic ischemic change. No acute infarct.  Ventricle size is normal. Negative for mass or edema. Negative  for hemorrhage or fluid collection.  Paranasal sinuses and mastoid sinuses are clear.  IMPRESSION:  Age appropriate atrophy and changes in the white matter. No acute  abnormality and no cause for headaches is identified.   DIAGNOSTIC STUDIES:   LABS / RADIOLOGY: Results for orders placed during the hospital encounter of 11/23/10  CBC      Component Value Range   WBC 5.7  4.0 - 10.5 (K/uL)   RBC 3.60 (*) 3.87 - 5.11 (MIL/uL)   Hemoglobin 11.6 (*) 12.0 - 15.0 (g/dL)   HCT 44.0 (*) 10.2 - 46.0 (%)   MCV 96.9  78.0 - 100.0 (fL)   MCH 32.2  26.0 - 34.0 (pg)   MCHC 33.2  30.0 - 36.0 (g/dL)   RDW 72.5  36.6 - 44.0 (%)   Platelets 222  150 - 400 (K/uL)  DIFFERENTIAL      Component Value Range   Neutrophils Relative 59  43 - 77 (%)   Neutro Abs 3.3  1.7 - 7.7 (K/uL)   Lymphocytes Relative 26  12 - 46 (%)   Lymphs Abs 1.5  0.7 - 4.0 (K/uL)   Monocytes Relative 11  3 - 12 (%)   Monocytes  Absolute 0.6  0.1 - 1.0 (K/uL)   Eosinophils Relative 3  0 - 5 (%)   Eosinophils Absolute 0.2  0.0 - 0.7 (K/uL)   Basophils Relative 1  0 - 1 (%)   Basophils Absolute 0.0  0.0 - 0.1 (K/uL)  COMPREHENSIVE METABOLIC PANEL      Component Value Range   Sodium 141  135 - 145 (mEq/L)   Potassium 3.6  3.5 - 5.1 (mEq/L)   Chloride 108  96 - 112 (mEq/L)   CO2 22  19 - 32 (mEq/L)   Glucose, Bld 100 (*) 70 - 99 (mg/dL)   BUN 22  6 - 23 (mg/dL)   Creatinine, Ser 3.47  0.50 - 1.10 (mg/dL)   Calcium 9.6  8.4 - 42.5 (mg/dL)   Total Protein 6.1  6.0 - 8.3 (g/dL)   Albumin 3.5  3.5 - 5.2 (g/dL)   AST 18  0 - 37 (U/L)   ALT 7  0 - 35 (U/L)   Alkaline Phosphatase 87  39 - 117 (U/L)   Total Bilirubin 0.2 (*) 0.3 - 1.2 (mg/dL)   GFR calc non Af Amer 54 (*) >60 (mL/min)   GFR calc Af Amer >60  >60 (mL/min)  MAGNESIUM      Component Value Range   Magnesium 1.8  1.5 - 2.5 (mg/dL)  PHOSPHORUS      Component Value Range   Phosphorus 4.1  2.3 - 4.6 (mg/dL)  TROPONIN I  Component Value Range   Troponin I <0.30  <0.30 (ng/mL)  CK TOTAL AND CKMB      Component Value Range   Total CK PENDING  7 - 177 (U/L)   CK, MB 2.1  0.3 - 4.0 (ng/mL)   Relative Index PENDING  0.0 - 2.5   URINALYSIS, ROUTINE W REFLEX MICROSCOPIC      Component Value Range   Color, Urine YELLOW  YELLOW    Appearance CLEAR  CLEAR    Specific Gravity, Urine 1.013  1.005 - 1.030    pH 6.0  5.0 - 8.0    Glucose, UA NEGATIVE  NEGATIVE (mg/dL)   Hgb urine dipstick NEGATIVE  NEGATIVE    Bilirubin Urine NEGATIVE  NEGATIVE    Ketones, ur NEGATIVE  NEGATIVE (mg/dL)   Protein, ur NEGATIVE  NEGATIVE (mg/dL)   Urobilinogen, UA 0.2  0.0 - 1.0 (mg/dL)   Nitrite NEGATIVE  NEGATIVE    Leukocytes, UA NEGATIVE  NEGATIVE    Dg Chest 2 View  11/23/2010  *RADIOLOGY REPORT*  Clinical Data: Dizziness.  CHEST - 2 VIEW  Comparison: Plain film chest 08/28/2010.  Findings: Mild linear atelectasis or scar in the right lung base is identified.  Lungs  otherwise clear.  Heart size is normal.  No pneumothorax or pleural effusion.  Postoperative change of resection of the distal right clavicle noted.  IMPRESSION: No acute disease.  Original Report Authenticated By: [id number]   Ct Head Wo Contrast  11/23/2010  *RADIOLOGY REPORT*  Clinical Data: Hypotension, occipital headache  CT HEAD WITHOUT CONTRAST  Technique:  Contiguous axial images were obtained from the base of the skull through the vertex without contrast.  Comparison: MRI brain dated 07/13/2010  Findings: No evidence of parenchymal hemorrhage or extra-axial fluid collection. No mass lesion, mass effect, or midline shift.  No CT evidence of acute infarction.  Mild global cortical atrophy.  No ventriculomegaly.  Subcortical white matter and periventricular small vessel ischemic changes.  Intracranial atherosclerosis.  The visualized paranasal sinuses are essentially clear. The mastoid air cells are unopacified.  No evidence of calvarial fracture.  IMPRESSION: No acute intracranial abnormality.  Atrophy with small vessel ischemic changes and intracranial atherosclerosis.  Original Report Authenticated By: Charline Bills, M.D.    Date: 11/23/2010  Rate: 50  Rhythm: sinus bradycardia  QRS Axis: normal  Intervals: normal  ST/T Wave abnormalities: nonspecific T wave changes  Conduction Disutrbances:none  Narrative Interpretation:   Old EKG Reviewed: unchanged old rate 68  PROCEDURES:  ED COURSE / COORDINATION OF CARE: 4:53PM-Patient vital signs noted and evaluated in room. Blood Pressure-116/72 mmHg, normal. Oxygen saturation 98% on room air, normal. 4:54PM-Explained plan of treatment in ED and patient consents to current plan. 6:38PM- Consult completed with patient's cardiologist- Dr. Drue Novel 6:46PM- Patient informed of information discussed with inpatient triad hospitalist Dr. Beverly Gust. Admit to tele. Informed of intent to admit to [facility]. Patient agrees with plan of action at this  time.   MDM: Differential Diagnosis: Broad ddx incl orthostasis, infectious, cardiac, neuro, medication effect. Suspect more dehydration/medication effects. Pt stable at this time s/p 500cc bolus of fluids. Admit for further evaluation and w/u  PLAN: Transfer The patient is to return the emergency department if there is any worsening of symptoms. I have reviewed the discharge instructions with the patient/family   CONDITION ON DISCHARGE: Gaurded   MEDICATIONS GIVEN IN THE E.D.  Medications  sodium chloride 0.9 % bolus 500 mL (500 mL Intravenous Given 11/23/10 1726)  sodium chloride (OCEAN) 0.65 % nasal spray (not administered)    I personally performed the services described in this documentation, which was scribed in my presence. The recorded information has been reviewed and considered. Forbes Cellar, MD        Forbes Cellar, MD 11/23/10 [phone]

## 2010-11-23 NOTE — ED Notes (Signed)
MD at bedside. 

## 2010-11-24 ENCOUNTER — Inpatient Hospital Stay (HOSPITAL_COMMUNITY): Payer: Medicare Other

## 2010-11-24 DIAGNOSIS — R42 Dizziness and giddiness: Secondary | ICD-10-CM

## 2010-11-24 LAB — DIFFERENTIAL
Basophils Absolute: 0 10*3/uL (ref 0.0–0.1)
Basophils Relative: 1 % (ref 0–1)
Eosinophils Absolute: 0.2 10*3/uL (ref 0.0–0.7)
Eosinophils Relative: 3 % (ref 0–5)
Lymphocytes Relative: 29 % (ref 12–46)

## 2010-11-24 LAB — COMPREHENSIVE METABOLIC PANEL
ALT: 7 U/L (ref 0–35)
AST: 16 U/L (ref 0–37)
Albumin: 2.9 g/dL — ABNORMAL LOW (ref 3.5–5.2)
Alkaline Phosphatase: 75 U/L (ref 39–117)
Chloride: 110 mEq/L (ref 96–112)
Potassium: 3.4 mEq/L — ABNORMAL LOW (ref 3.5–5.1)
Sodium: 140 mEq/L (ref 135–145)
Total Bilirubin: 0.3 mg/dL (ref 0.3–1.2)
Total Protein: 5.5 g/dL — ABNORMAL LOW (ref 6.0–8.3)

## 2010-11-24 LAB — CARDIAC PANEL(CRET KIN+CKTOT+MB+TROPI)
CK, MB: 2.4 ng/mL (ref 0.3–4.0)
Relative Index: INVALID (ref 0.0–2.5)
Relative Index: INVALID (ref 0.0–2.5)
Relative Index: INVALID (ref 0.0–2.5)
Total CK: 44 U/L (ref 7–177)
Troponin I: <0.3 ng/mL (ref <0.30)
Troponin I: <0.3 ng/mL (ref <0.30)
Troponin I: <0.3 ng/mL (ref <0.30)

## 2010-11-24 LAB — CBC
HCT: 32.6 % — ABNORMAL LOW (ref 36.0–46.0)
MCHC: 32.8 g/dL (ref 30.0–36.0)
Platelets: 202 10*3/uL (ref 150–400)
RDW: 12.7 % (ref 11.5–15.5)
WBC: 5.5 10*3/uL (ref 4.0–10.5)

## 2010-11-24 MED ORDER — GADOBENATE DIMEGLUMINE 529 MG/ML IV SOLN
14.0000 mL | Freq: Once | INTRAVENOUS | Status: AC | PRN
  Administered 2010-11-24: 14 mL via INTRAVENOUS

## 2010-11-24 NOTE — H&P (Signed)
[name], [name] NO.:  [id number]  MEDICAL RECORD NO.:  [id number]  LOCATION:  1443                         FACILITY:  [facility]  PHYSICIAN:  Talmage Nap, MD  DATE OF BIRTH:  11/18/1929  DATE OF ADMISSION:  11/23/2010 DATE OF DISCHARGE:                             HISTORY & PHYSICAL   PRIMARY CARE PHYSICIAN:  [place].  History obtainable from the patient.  CHIEF COMPLAINT:  Dizziness and headache for about 3 days' duration.  HISTORY OF PRESENT ILLNESS:  The patient is a 77 year old very pleasant Caucasian female with history of hypertension and hyperlipidemia as well as history of SVT, was [facility] [place] to [facility] with complaint of dizziness as well as headache and the patient was also found to be hypotensive.  The patient claimed that 3 days prior to presenting to the hospital, she had been in stable health until she developed dizziness, which according to the patient was generalized and that was associated with headache.  At the time, the patient experienced these symptoms.  She took her blood pressure and her blood pressure was on the low side of normal.  The dizziness and the headache have persisted.  She claims she repeated her blood pressure intake and systolic was 80 mmHg.  This time around, she felt nauseated, but she denied any vomiting.  She denied any chest pain.  She denied any shortness of breath.  She denied any systemic symptoms.  No fever.  No chills.  No rigor.  She also denied any history of slurred speech or unsteady gait.  She initially wanted to follow with her primary care physician, but she was unable to make an appointment and subsequently presented [facility].  At [facility], the patient was found to be hypotensive with blood pressure of 85/58.  She was given IV fluid and subsequently transferred to [facility] for further evaluation and stabilization.  At  the time the patient was seen by me at [facility], she had mild headache with dizzy spell, which has reduced more in intensity and also complained about nausea, but she denied any vomiting.  She denied any fever.  She denied any chills.  She denied any rigor.  She denied any history of slurred speech or unsteady gait. After evaluating the patient, she was advised to be admitted for further workup.  PAST MEDICAL HISTORY:  Positive for, 1. Hyperlipidemia. 2. Hypertension. 3. Anxiety disorder. 4. Depression. 5. Pulmonary nodule. 6. Knee pain. 7. Back pain. 8. History of SVT. 9. History of recurrent headaches. 10.Hydronephrosis. 11.History of GI bleed secondary to polyps and diverticulosis as well     as internal hemorrhoids.  PAST SURGICAL HISTORY: 1. Abdominal hysterectomy. 2. Rotator cuff repair. 3. Total knee arthroplasty. 4. Cataract extraction. 5. Right oophorectomy. 6. Lumbar reconstructive surgery. 7. Appendectomy. 8. Cholecystectomy. 9. Tonsillectomy. 10.Bladder repair surgery. 11.Shoulder surgery.  PREADMISSION MEDICATIONS: 1. Aspirin 81 mg 1 p.o. daily. 2. Atorvastatin 40 mg 1 p.o. at bedtime. 3. Bepreve 1.5% solution 1 drop both eyes p.r.n. for itchy eye. 4. Calcium carbonate with vitamin D (calcium 600 plus D) one and half     tablets taken  2 times daily. 5. Casanthranol-docusate sodium 30/100 two caps p.o. daily. 6. Cetirizine (Zyrtec) 10 mg 1 p.o. daily. 7. Diltiazem (Cardizem CD) 180 mg 1 p.o. daily. 8. Gabapentin (Neurontin) 100 mg 2-3 tablets daily p.r.n. 9. Ginkgo biloba 120 mg p.o. daily. 10.Lidoderm (5%) apply to the skin q.12 h. and subsequently removed. 11.Methocarbamol (Robaxin) 500 mg 1 p.o. twice a day. 12.Micardis 40 mg 1 p.o. every day. 13.Miscellaneous natural products (Osteo Bi-Flex ADV SSD) 1 p.o.     daily. 14.Multivitamin (Theragran) 1 p.o. daily. 15.Premarin 0.3 mg 1 p.o. daily. 16.Sodium chloride 0.65% nasal spray, 1 spray each  nostril p.r.n. 17.Topiramate (Topamax) 100 mg 1 p.o. daily at bedtime.  ALLERGIES: 1. AMITRIPTYLINE. 2. CARISOPRODOL. 3. CODEINE. 4. MELOXICAM. 5. METRONIDAZOLE. 6. NORTRIPTYLINE. 7. OXAPROZIN. 8. PROMETHAZINE. 9. ROFECOXIB. 10.SOMATROPIN.  SOCIAL HISTORY:  Negative for alcohol or tobacco use.  FAMILY HISTORY:  Mother had breast cancer and father had lung cancer, both had deceased.  Mother was also said to be diabetic and also had coronary artery disease.  Brother has colitis.  REVIEW OF SYSTEMS:  The patient still complains about slight headache with dizzy spell and nausea.  She denies any vomiting.  She denies any slurred speech.  She denies any cough.  No vomiting.  No fever.  No chills.  No rigor.  No chest pain or no shortness of breath.  Denies any PND or orthopnea.  No abdominal discomfort.  No diarrhea or hematochezia.  No dysuria or hematuria.  No swelling of the lower extremity.  Complained of excessive thirst.  No intolerance to heat or cold and no neuropsychiatric disorder.  PHYSICAL EXAMINATION:  GENERAL:  Very pleasant elderly lady, not in any respiratory distress, dehydrated. VITAL SIGNS:  Blood pressure is 126/80, temperature is 97.7, pulse 62, respiratory rate 20, saturating 99% on room air. HEENT:  Pupils are reactive to light and extraocular muscles are intact. NECK:  No jugular venous distention.  No carotid bruit.  No lymphadenopathy. CHEST:  Clear to auscultation. HEART:  Sounds are 1 and 2. ABDOMEN:  Soft, nontender.  Liver, spleen, kidney not palpable.  Bowel sounds are positive. EXTREMITIES:  No pedal edema. NEUROLOGIC:  Nonfocal. MUSCULOSKELETAL SYSTEM:  Show arthritic changes in the knees and the feet. NEUROPSYCHIATRIC EVALUATION:  Unremarkable. SKIN:  Shows decreased turgor.  LABORATORY DATA:  Chemistry shows sodium of 141, potassium of 3.6, chloride of 108 with a bicarb of 22, BUN is 22, creatinine is 1.00, glucose is 100, phosphorus is  4.1, magnesium level is 1.8.  LFT essentially normal.  First set of cardiac markers, troponin I less than 0.30.  Hematological indices showed WBC of 5.7, hemoglobin 11.6, hematocrit of 34.9, MCV of 96.9 with a platelet count of 222.  Normal differential.  Urinalysis unremarkable.  IMAGING STUDIES:  CT of the head without contrast and it showed no evidence of parenchymal hemorrhage or extra-axial fluid, no mass lesion or mass effect seen.  There is atrophy with small vessel ischemic changes and intracranial atherosclerosis.  Chest x-ray was essentially normal.  IMPRESSION: 1. Dizziness, etiology unknown. 2. Headache. 3. Hypotension, BP now stable. 4. Dehydration. 5. Hyperlipidemia. 6. Anxiety disorder. 7. Depression. 8. History of pulmonary nodule.  PLAN:  Admit the patient to Telemetry.  The patient will be slowly rehydrated with half-normal saline IV to go at rate of 65 cc an hour. She will be on aspirin 81 mg p.o. daily, morphine 2 mg IV q.4 p.r.n. for headache, and also be on Topamax 100 mg p.o.  at bedtime.  Since the patient's initial blood pressure was low at 86/56 and now stabilized at 126/80, the dose of Micardis will be reduced to 20 mg p.o. daily.  GI prophylaxis will be done with Protonix 40 mg IV q.24 and DVT prophylaxis with TED stockings.  Further workup to be done on this patient will include cardiac enzymes q.6 x3.  CBC, CMP, and magnesium will be repeated in the a.m.  Imaging studies to be ordered will include MRI and MRA of the head and neck, 2-D echo, and carotid duplex.  The patient will be reevaluated with lab result.  She will also be evaluated on a daily basis.     Talmage Nap, MD     CN/MEDQ  D:  11/23/2010  T:  11/24/2010  Job:  [id number]  Electronically Signed by Talmage Nap  on 11/24/2010 12:08:36 PM

## 2010-11-25 LAB — IRON AND TIBC
Iron: 94 ug/dL (ref 42–135)
UIBC: 134 ug/dL

## 2010-11-25 LAB — BASIC METABOLIC PANEL
BUN: 18 mg/dL (ref 6–23)
Calcium: 9.1 mg/dL (ref 8.4–10.5)
Chloride: 111 mEq/L (ref 96–112)
Creatinine, Ser: 1 mg/dL (ref 0.50–1.10)
GFR calc Af Amer: >60 mL/min (ref >60)

## 2010-11-25 LAB — FERRITIN: Ferritin: 41 ng/mL (ref 10–291)

## 2010-11-26 NOTE — Discharge Summary (Signed)
[name], LEMME NO.:  [id number]  MEDICAL RECORD NO.:  [id number]  LOCATION:  1443                         FACILITY:  [facility]  PHYSICIAN:  Brendia Sacks, MD    DATE OF BIRTH:  01-07-1930  DATE OF ADMISSION:  11/23/2010 DATE OF DISCHARGE:  11/25/2010                              DISCHARGE SUMMARY   PRIMARY CARE PHYSICIAN:  [name], MD, [name]  CONDITION ON DISCHARGE:  Improved.  DISPOSITION:  Return to independent living.  DISCHARGE DIAGNOSES: 1. Dizziness and hypotension, resolved. 2. Hypokalemia, resolved. 3. Headache, resolved.  HISTORY OF PRESENT ILLNESS:  This is a 77 year old woman who was seen at [facility] for dizziness and headache.  She was found to be hypotensive there and transferred to [facility] for further evaluation. Blood pressure at that time was 85/58.  She was given fluids and sent to [facility] for further evaluation and treatment.  HOSPITAL COURSE:  The patient was admitted and treated with IV fluids. Her hypotension has resolved.  It is felt to be multifactorial including dehydration as well as antihypertensive medications.  She notes that she just recently stopped her Micardis, as this had been causing her blood pressure to go too low.  Her orthostatics were negative today and she has been ambulating around the room without difficulty.  She was assessed by Physical Therapy and not felt to have any outpatient needs. Her workup here in the hospital was negative and she appears to be stable for discharge at this time.  Presumed etiology for her dizziness and hypotension was dehydration and antihypertensive medication.  Her headache has resolved during this hospitalization and she is stable for discharge home.  CONSULTATIONS:  None.  PROCEDURES: 1. CT of the head, August 6th:  No acute intracranial abnormality. 2. Chest x-ray, August 6th:  No acute disease. 3. MRI of the brain, August 7th:  No acute  infarct. 4. MRA of the head and neck:  No evidence for hemodynamically     significant stenosis involving either the carotid bifurcation or     involving the vertebral arteries.  ANCILLARY STUDIES:  Bilateral carotid ultrasound, preliminary report negative for significant ICA stenosis.  Bilateral vertebral flow was antegrade.  Review of 2-D echocardiogram done in May 2012 showed a left ventricular ejection fraction of 55% to 60%.  PERTINENT LABORATORY STUDIES: 1. TSH was within normal limits. 2. CBC was notable for a hemoglobin of 10.7. 3. Basic metabolic panel essentially unremarkable. 4. Cardiac enzymes were negative.  The patient had no evidence of     bleeding.  EKG independently reviewed showed no acute disease.  Initial EKG showed sinus bradycardia with a rate of 50 and repeat EKG showed sinus bradycardia with a rate of 51.  PHYSICAL EXAMINATION ON DISCHARGE:  GENERAL:  The patient is feeling well.  She has been ambulating in the room with no dizziness.  Her orthostatics are negative. VITAL SIGNS:  Temperature is 97.4, pulse 58, respirations 18, blood pressure 114/73, saturating 97% on room air. CARDIOVASCULAR:  Regular rhythm.  No murmur, rub, gallop.  No lower extremity edema. RESPIRATORY:  Clear to auscultation bilaterally.  No wheezes, rales, or rhonchi.  DISCHARGE INSTRUCTIONS:  The patient will be discharged home back to independent living today.  DIET:  Heart-healthy diet.  ACTIVITIES:  Unrestricted.  FOLLOWUP:  Follow up with Dr. Drue Novel in 1-2 weeks.  DISCHARGE MEDICATIONS: 1. Aspirin 81 mg p.o. daily. 2. Biofreeze gel as needed 4 times a day for knee pain. 3. Calcium carbonate and vitamin D 1-2 tablets p.o. b.i.d. 4. Cartia XT 180 mg p.o. daily. 5. Ginkgo biloba 120 mg p.o. daily. 6. Lidocaine patch applied to each knee and a half on the left thumb     at night, 12 hours on and 12 hours off. 7. Lipitor 40 mg p.o. daily. 8. Multivitamin half tablet p.o.  b.i.d. 9. Neurontin 100 mg 1-3 capsules p.o. q.i.d. as directed by primary     care physician. 10.Osteo Bi-Flex over-the-counter as directed. 11.Premarin 0.3 mg 1 tablet p.o. daily. 12.Psyllium husk 500 mg 2 tablets p.o. daily. 13.Robaxin 500 mg p.o. every 8 hours. 14.Topamax 100 mg p.o. daily. 15.Zyrtec 10 mg p.o. daily as needed.  Discontinue Micardis.  Things to follow up in the outpatient setting, 1. Continue to control blood pressure.  Discussing with the patient,     her heart rate is always in the 50s to 60s and she has been on     Cartia for greater than 8 years with her pulse being in this range.     At this point then I would continue her on this as she uses this to     treat SVT.  I discussed this with her and her husband.  Time coordinating discharge is 35 minutes.     Brendia Sacks, MD     DG/MEDQ  D:  11/25/2010  T:  11/26/2010  Job:  [id number]  cc:   [name], MD [phone] [place], [place] [address]  Electronically Signed by Brendia Sacks  on 11/26/2010 05:56:56 PM

## 2010-11-30 ENCOUNTER — Telehealth: Payer: Self-pay | Admitting: Internal Medicine

## 2010-11-30 NOTE — Telephone Encounter (Signed)
Pt will need separate appt.

## 2010-12-02 NOTE — Telephone Encounter (Signed)
Dr Drue Novel says patient will need separate appt for hosp followup---put her in for Tues 8/21 at 3:15---LMOM for her to call and resch if that is not a good time for her

## 2010-12-08 ENCOUNTER — Ambulatory Visit: Payer: Medicare Other | Admitting: Internal Medicine

## 2010-12-11 ENCOUNTER — Ambulatory Visit (INDEPENDENT_AMBULATORY_CARE_PROVIDER_SITE_OTHER): Payer: Medicare Other | Admitting: Internal Medicine

## 2010-12-11 ENCOUNTER — Encounter: Payer: Self-pay | Admitting: Internal Medicine

## 2010-12-11 DIAGNOSIS — Z8679 Personal history of other diseases of the circulatory system: Secondary | ICD-10-CM

## 2010-12-11 DIAGNOSIS — R51 Headache: Secondary | ICD-10-CM

## 2010-12-11 DIAGNOSIS — I10 Essential (primary) hypertension: Secondary | ICD-10-CM

## 2010-12-11 LAB — CBC WITH DIFFERENTIAL/PLATELET
Eosinophils Relative: 2 % (ref 0–5)
HCT: 37.7 % (ref 36.0–46.0)
Hemoglobin: 12 g/dL (ref 12.0–15.0)
Lymphocytes Relative: 25 % (ref 12–46)
Lymphs Abs: 1.5 10*3/uL (ref 0.7–4.0)
MCV: 99 fL (ref 78.0–100.0)
Monocytes Absolute: 0.6 10*3/uL (ref 0.1–1.0)
Monocytes Relative: 10 % (ref 3–12)
RBC: 3.81 MIL/uL — ABNORMAL LOW (ref 3.87–5.11)
RDW: 13.2 % (ref 11.5–15.5)
WBC: 6 10*3/uL (ref 4.0–10.5)

## 2010-12-11 NOTE — Assessment & Plan Note (Signed)
Cont w/ CCBs

## 2010-12-11 NOTE — Progress Notes (Signed)
Subjective:    Patient ID: [name], female    DOB: 21-Dec-1929, 77 y.o.   MRN: [id number]  HPI Admitted to the hospital for a couple of days, discharge 11-25-10. She presented with dizziness and headache for 3 days, at the ER was found to have a blood pressure of 85/58, wasRx IV fluids and then admitted. Discharge diagnoses: 1. Dizziness and hypotension, resolved.  2. Hypokalemia, resolved.  3. Headache, resolved.   PROCEDURES:   1. CT of the head, August 6th:  No acute intracranial abnormality.   2. Chest x-ray, August 6th:  No acute disease.   3. MRI of the brain, August 7th:  No acute infarct.   4. MRA of the head and neck:  No evidence for hemodynamically  significant stenosis involving either the carotid bifurcation or   involving the vertebral arteries.  Normal carotid ultrasound. TSH normal, anemia panel essentially negative, BMP, cardiac enzymes negative. Patient was recommended to discontinue Micardis   Past Medical History  Diagnosis Date  . Hyperlipidemia   . Hypertension   . Anxiety and depression   . Pulmonary nodules     Wert. First seen by CT only 05/04/03, no change on f/u 08/08/06 -Macroscopic changes rml 04/27/07 > resolved april 6,2010 no further w/u   . Knee pain     right chronic  . Back pain     lumbar  . SVT (supraventricular tachycardia)     sees Dr.Ross  . Hydronephrosis     Right 2008 s/p urology eval CT/cysto: observation/monitor  . Headache   . GI (gastrointestinal bleed)     hx of colon polyps 2004, diverticulosis, internal hemmorhids        Review of Systems Since she left the hospital, she is doing better. She did discontinue Micardis. Headache is essentially resolved Weakness is at baseline. Appetite is very good, she is doing her physical therapy without any problems. Denies any nausea, vomiting, diarrhea. No blood in the stools.    Objective:   Physical Exam  Constitutional: She is oriented to person, place, and time. She  appears well-developed and well-nourished.  HENT:  Head: Normocephalic and atraumatic.  Eyes:       Not pale   Cardiovascular: Normal rate, regular rhythm and normal heart sounds.   No murmur heard. Pulmonary/Chest: Effort normal. No respiratory distress. She has no wheezes. She has no rales.  Musculoskeletal: She exhibits no edema.  Neurological: She is alert and oriented to person, place, and time.  Psychiatric: She has a normal mood and affect. Her behavior is normal. Judgment and thought content normal.          Assessment & Plan:    aspirin 81 MG tablet (Taking)  Take 81 mg by mouth daily.    atorvastatin (LIPITOR) 40 MG tablet (Taking)  TAKE 1 TABLET BY MOUTH EVERY NIGHT AT BEDTIME   BEPREVE 1.5 % SOLN (Taking)  Place 1 drop into both eyes as needed. Itchy eyes   Calcium Carbonate-Vitamin D (CALCIUM 600+D) 600-400 MG-UNIT per tablet (Taking)  Take 1.5 tablets by mouth 2 (two) times daily.    Casanthranol-Docusate Sodium 30-100 MG CAPS (Taking)  Take 2 capsules by mouth daily.    cetirizine (ZYRTEC) 10 MG tablet (Taking)  Take 10 mg by mouth daily.    diltiazem (CARDIZEM CD) 180 MG 24 hr capsule (Taking)  Take 180 mg by mouth daily.    gabapentin (NEURONTIN) 100 MG capsule (Taking)  Take 200-300 mg by mouth 3 (three)  times daily as needed.    Ginkgo Biloba 120 MG TABS (Taking)  Take 1 tablet by mouth daily.    lidocaine (LIDODERM) 5 % (Taking)  Place 1 patch onto the skin daily. Remove & Discard patch within 12 hours or as directed by MD    methocarbamol (ROBAXIN) 500 MG tablet (Taking)  Take 500 mg by mouth 2 (two) times daily.    Misc Natural Products (OSTEO BI-FLEX ADV DOUBLE ST PO) (Taking)  Take 1 capsule by mouth daily.    multivitamin (THERAGRAN) per tablet (Taking)  Take 1 tablet by mouth daily.    PREMARIN 0.3 MG tablet (Taking)  TAKE 1 TABLET BY MOUTH DAILY   sodium chloride (OCEAN) 0.65 % nasal spray (Taking)  Place 1 spray into the nose as needed. Dry nose     topiramate (TOPAMAX) 100 MG tablet (Taking)  Take 50 mg by mouth at bedtime.

## 2010-12-11 NOTE — Assessment & Plan Note (Signed)
Essentially resolved. 

## 2010-12-11 NOTE — Assessment & Plan Note (Addendum)
S/p admission d/t hypotension, BP still in the low side despotre stopping ARBs Unclear why she is requiring less meds Hg was slt low @ the hospital  Plan: Cont w/ CCBs given h/o SVT CBC See instructions

## 2010-12-11 NOTE — Patient Instructions (Signed)
Check the  blood pressure daily , be sure it is between 105/60 and  140/85. If it is consistently higher or lower, let me know

## 2010-12-17 ENCOUNTER — Other Ambulatory Visit: Payer: Self-pay | Admitting: Internal Medicine

## 2010-12-17 NOTE — Telephone Encounter (Signed)
Rx Done . 

## 2010-12-23 ENCOUNTER — Encounter: Payer: Medicare Other | Admitting: Internal Medicine

## 2010-12-29 NOTE — Telephone Encounter (Signed)
Was seen on 12/11/2010

## 2010-12-31 ENCOUNTER — Other Ambulatory Visit: Payer: Self-pay | Admitting: Internal Medicine

## 2011-02-17 ENCOUNTER — Other Ambulatory Visit: Payer: Self-pay | Admitting: Internal Medicine

## 2011-03-09 ENCOUNTER — Other Ambulatory Visit: Payer: Self-pay | Admitting: Obstetrics and Gynecology

## 2011-03-09 DIAGNOSIS — N644 Mastodynia: Secondary | ICD-10-CM

## 2011-03-19 ENCOUNTER — Encounter: Payer: Self-pay | Admitting: Internal Medicine

## 2011-03-19 ENCOUNTER — Ambulatory Visit (INDEPENDENT_AMBULATORY_CARE_PROVIDER_SITE_OTHER): Payer: Medicare Other | Admitting: Internal Medicine

## 2011-03-19 DIAGNOSIS — M549 Dorsalgia, unspecified: Secondary | ICD-10-CM

## 2011-03-19 DIAGNOSIS — R0600 Dyspnea, unspecified: Secondary | ICD-10-CM

## 2011-03-19 DIAGNOSIS — E785 Hyperlipidemia, unspecified: Secondary | ICD-10-CM

## 2011-03-19 DIAGNOSIS — Z0000 Encounter for general adult medical examination without abnormal findings: Secondary | ICD-10-CM | POA: Insufficient documentation

## 2011-03-19 DIAGNOSIS — M1990 Unspecified osteoarthritis, unspecified site: Secondary | ICD-10-CM | POA: Insufficient documentation

## 2011-03-19 DIAGNOSIS — R0609 Other forms of dyspnea: Secondary | ICD-10-CM

## 2011-03-19 DIAGNOSIS — F329 Major depressive disorder, single episode, unspecified: Secondary | ICD-10-CM

## 2011-03-19 LAB — LIPID PANEL
HDL: 47 mg/dL (ref >39.00)
VLDL: 44.4 mg/dL — ABNORMAL HIGH (ref 0.0–40.0)

## 2011-03-19 LAB — LDL CHOLESTEROL, DIRECT: Direct LDL: 60.7 mg/dL

## 2011-03-19 NOTE — Progress Notes (Signed)
Subjective:    Patient ID: [name], female    DOB: 1930-03-25, 77 y.o.   MRN: [id number]  HPI Here for Medicare AWV: 1. Risk factors based on Past M, S, F history: reviewed 2. Physical Activities:active at home, no regular exercise  3. Depression/mood:  No current problems w/ her mood 4. Hearing:  No problems noted or reported 5. ADL's:  Independent, still drives  6. Fall Risk: no recent, counseled  7. home Safety: does feelsafe at home  8. Height, weight, &visual acuity: see VS, has cataract surgery, doing well  9. Counseling: provided 10. Labs ordered based on risk factors: if needed  11. Referral Coordination: if needed 12.  Care Plan, see assessment and plan  13.   Cognitive Assessment: cognition wnl  In addition, today we discussed the following: HAs-- well controlled w/ topamax Back pain, sciatica-- on neurontin, sx relatively well controlled  SVT--  Good med compliance w/ cartia   Past Medical History  Diagnosis Date  . Hyperlipidemia   . Hypertension   . Anxiety and depression   . Pulmonary nodules     [place]. First seen by CT only 05/04/03, no change on f/u 08/08/06 -Macroscopic changes rml 04/27/07 > resolved april 6,2010 no further w/u   . Knee pain     right chronic  . Back pain     lumbar  . SVT (supraventricular tachycardia)     sees Dr.Ross  . Hydronephrosis     Right 2008 s/p urology eval CT/cysto: observation/monitor  . Headache   . GI (gastrointestinal bleed)     hx of colon polyps 2004, diverticulosis, internal hemmorhids   Past Surgical History  Procedure Date  . Abdominal hysterectomy   . Rotator cuff repair 9/-2011  . Total knee arthroplasty     right  . Cataract extraction 2008    Dr.Digby  . R ovary removed   . Lumbar reconstructive surgery 01/07/2009  . Appendectomy   . Cholecystectomy   . Tonsillectomy   . Bladder repair    History   Social History  . Marital Status: Widowed    Spouse Name: N/A    Number of Children: 1  .  Years of Education: N/A   Occupational History  . retired     Social History Main Topics  . Smoking status: Never Smoker   . Smokeless tobacco: Never Used  . Alcohol Use: Yes     rarely  . Drug Use: No  . Sexually Active: Not on file   Other Topics Concern  . Not on file   Social History Narrative   Moved to a retirement home 05/2008 [place], has 3 meals a day, has a cleaning service---Son lives in [place]   Family History  Problem Relation Age of Onset  . Colon cancer Neg Hx   . Breast cancer Mother     later in life  . Lung cancer Father     apparently had TB  . Diabetes Mother     late in life  . Heart disease Mother   . Heart disease Father   . Prostate cancer Father   . Colitis Brother      Review of Systems No palpitations, chest pain or shortness of breath. Occasional dyspnea on exertion after singing  for one hour or after walking from one extreme of his assisted living facility to the other. No orthopnea, lower extremity edema. Normal echocardiogram 08/2010. No nausea, vomiting, diarrhea. No blood in the stools. She  does have chronic constipation.     Objective:   Physical Exam  Constitutional: She is oriented to person, place, and time. She appears well-developed and well-nourished. No distress.  HENT:  Head: Normocephalic and atraumatic.  Cardiovascular: Normal rate.   No murmur heard. Pulmonary/Chest: Breath sounds normal. No respiratory distress. She has no wheezes. She has no rales.  Abdominal: Soft. Bowel sounds are normal. She exhibits no distension. There is no tenderness. There is no rebound and no guarding.  Musculoskeletal: She exhibits no edema.  Neurological: She is alert and oriented to person, place, and time.  Skin: She is not diaphoretic.  Psychiatric: She has a normal mood and affect. Her behavior is normal. Judgment and thought content normal.       Assessment & Plan:  Occasional dyspnea on exertion, review of systems  negative, abnormal echocardiogram recently. Likely deconditioning. Recommend observation, keep active, will let me know if symptoms increase

## 2011-03-19 NOTE — Assessment & Plan Note (Signed)
Chronic, parking permit signed

## 2011-03-19 NOTE — Assessment & Plan Note (Addendum)
Td 2004 shingles shot 2007 pneumonia shot 2004 and 2009 had a flu shot this year  Colonoscopy w/  Dr Arlyce Dice, + polyp 11-04  colonoscopy 04-2008 @ [facility] : Hemorrhoids, diverticuli.  No polyps  sees gynecology, HRT was d/c , to have a MMG soon  DEXA 2004 and 2009 (normal), will order another in 2013  counseled : diet (has improved already) ,  exercise when able (had back surgery last year)

## 2011-03-20 ENCOUNTER — Other Ambulatory Visit: Payer: Self-pay | Admitting: Internal Medicine

## 2011-03-21 NOTE — Assessment & Plan Note (Signed)
Not an issue at present.  

## 2011-03-21 NOTE — Assessment & Plan Note (Signed)
good medication compliance  

## 2011-03-23 ENCOUNTER — Other Ambulatory Visit: Payer: Self-pay | Admitting: Obstetrics and Gynecology

## 2011-03-23 ENCOUNTER — Ambulatory Visit
Admission: RE | Admit: 2011-03-23 | Discharge: 2011-03-23 | Disposition: A | Discharge status: Home / Self Care | Payer: Medicare Other | Source: Ambulatory Visit | Attending: Obstetrics and Gynecology | Admitting: Obstetrics and Gynecology

## 2011-03-23 DIAGNOSIS — N644 Mastodynia: Secondary | ICD-10-CM

## 2011-03-26 ENCOUNTER — Other Ambulatory Visit: Payer: Self-pay | Admitting: Internal Medicine

## 2011-03-26 NOTE — Telephone Encounter (Signed)
Last filled 03-10-10 # 3 month 3 refills, last OV 03-19-11

## 2011-03-29 ENCOUNTER — Other Ambulatory Visit: Payer: Self-pay | Admitting: Internal Medicine

## 2011-03-30 NOTE — Telephone Encounter (Signed)
Duplicate message already sent for physician approval.

## 2011-03-30 NOTE — Telephone Encounter (Signed)
Last OV 03-19-11, last filled 03-10-10 3 month 3 refills.

## 2011-03-31 MED ORDER — LIDOCAINE 5 % EX PTCH: 1.0000 | MEDICATED_PATCH | CUTANEOUS | Status: DC

## 2011-03-31 NOTE — Telephone Encounter (Signed)
Done see other note 

## 2011-03-31 NOTE — Telephone Encounter (Signed)
Okay to refill six-month

## 2011-03-31 NOTE — Telephone Encounter (Signed)
Per dr Drue Novel ok with 6 refills

## 2011-06-07 ENCOUNTER — Other Ambulatory Visit: Payer: Self-pay | Admitting: Internal Medicine

## 2011-06-07 MED ORDER — DILTIAZEM HCL ER COATED BEADS 180 MG PO CP24: 180.0000 mg | ORAL_CAPSULE | Freq: Every day | ORAL | Status: DC

## 2011-06-07 NOTE — Telephone Encounter (Signed)
Refill done.  

## 2011-08-17 ENCOUNTER — Other Ambulatory Visit: Payer: Self-pay | Admitting: Internal Medicine

## 2011-08-18 NOTE — Telephone Encounter (Signed)
Refill done.  

## 2011-09-17 ENCOUNTER — Ambulatory Visit (INDEPENDENT_AMBULATORY_CARE_PROVIDER_SITE_OTHER): Payer: Medicare Other | Admitting: Internal Medicine

## 2011-09-17 VITALS — BP 140/80 | HR 61 | Temp 97.6°F | Wt 154.0 lb

## 2011-09-17 DIAGNOSIS — K649 Unspecified hemorrhoids: Secondary | ICD-10-CM

## 2011-09-17 DIAGNOSIS — R42 Dizziness and giddiness: Secondary | ICD-10-CM

## 2011-09-17 DIAGNOSIS — E785 Hyperlipidemia, unspecified: Secondary | ICD-10-CM

## 2011-09-17 DIAGNOSIS — I10 Essential (primary) hypertension: Secondary | ICD-10-CM

## 2011-09-17 MED ORDER — HYDROCORTISONE ACE-PRAMOXINE 1-1 % RE CREA: TOPICAL_CREAM | Freq: Two times a day (BID) | RECTAL | Status: AC

## 2011-09-17 MED ORDER — DILTIAZEM HCL ER COATED BEADS 120 MG PO CP24: 120.0000 mg | ORAL_CAPSULE | Freq: Every day | ORAL | Status: DC

## 2011-09-17 NOTE — Progress Notes (Signed)
  Subjective:    Patient ID: [name], female    DOB: March 03, 1930, 78 y.o.   MRN: [id number]  HPI Routine visit, several concerns. On 09/04/2011 she woke up dizzy, described as imbalance, not spinning. When she was talking with other people her "head felt cloudy".  Doesn't recall if the dizziness was worse with head motion. Symptoms lasted 24 hours, symptoms have not come back. Since the incident, she is feeling slightly more imbalance than her baseline.  Also complained of itching, burning and some soreness at the perianal area. She has a history of hemorrhoids, occasionally sees drops of blood in the toilet paper  She continue with shoulder pain on the left, already assessed by orthopedic surgery.   Past Medical History  Diagnosis Date  . Hyperlipidemia   . Hypertension   . Anxiety and depression   . Pulmonary nodules     Wert. First seen by CT only 05/04/03, no change on f/u 08/08/06 -Macroscopic changes rml 04/27/07 > resolved april 6,2010 no further w/u   . Knee pain     right chronic  . Back pain     lumbar  . SVT (supraventricular tachycardia)     sees Dr.Ross  . Hydronephrosis     Right 2008 s/p urology eval CT/cysto: observation/monitor  . Headache   . GI (gastrointestinal bleed)     hx of colon polyps 2004, diverticulosis, internal hemmorhids   Past Surgical History  Procedure Date  . Abdominal hysterectomy   . Rotator cuff repair 9/-2011  . Total knee arthroplasty     right  . Cataract extraction 2008    Dr.Digby  . R ovary removed   . Lumbar reconstructive surgery 01/07/2009  . Appendectomy   . Cholecystectomy   . Tonsillectomy   . Bladder repair      Review of Systems  denies any recent head injury. No headaches, diplopia, slurred speech, facial paresthesias, motor deficits. No chest pain or palpitations Denies abdominal pain. Ambulatory BPs under excellent control, in fact sometimes her systolic BP is around 110. Good compliance with all  medicines. On Topamax for migraines, migraines well-controlled     Objective:   Physical Exam General -- alert, well-developed, and well-nourished.   Neck --no thyromegaly , normal carotid pulse, no bruit Lungs -- normal respiratory effort, no intercostal retractions, no accessory muscle use, and normal breath sounds.   Heart-- normal rate, regular rhythm, no murmur, and no gallop.   Abdomen--soft, non-tender, no distention, no masses, no HSM, no guarding, and no rigidity.   Extremities-- no pretibial edema bilaterally Rectal--  external appearance normal, digital rectal exam show no mass; no stools ,mucus or blood found Neurologic-- alert & oriented X3 , speech and gait are normal. Memory normal. Strength and DTRs symmetric. External ocular movements are intact. Psych-- Cognition and judgment appear intact. Alert and cooperative with normal attention span and concentration.  not anxious appearing and not depressed appearing.      Assessment & Plan:  Today , I spent more than 45 min with the patient, >50% of the time counseling, and  reviewing the chart and labs

## 2011-09-17 NOTE — Assessment & Plan Note (Signed)
Will recheck labs 

## 2011-09-17 NOTE — Patient Instructions (Addendum)
analpram twice a day for one week, then as needed. Get the over-the-counter ointment NUPERCAINAL   and use it as needed for itching and discomfort Use baby wipes -------------------------- Please come back fasting: FLP, ast alt --- dx  high cholesterol BMP, CBC--- dx  hypertension ------------------------ Change Cartia to 120 mg daily. Check the  blood pressure 2 or 3 times a week, be sure it is between 110/60 and 140/85. If it is consistently higher or lower, let me know    Transient Ischemic Attack A transient ischemic attack (TIA) is a "warning stroke" that causes stroke-like symptoms. Unlike a stroke, a TIA does not cause permanent damage to the brain. The symptoms of a TIA can happen very fast and do not last long. It is important to know the symptoms of a TIA and what to do. This can help prevent a major stroke or death. CAUSES   A TIA is caused by a temporary blockage in an artery in the brain or neck (carotid artery). The blockage does not allow the brain to get the blood supply it needs and can cause different symptoms. The blockage can be caused by either:   A blood clot.   Fatty buildup (plaque) in a neck or brain artery.  SYMPTOMS  TIA symptoms are the same as a stroke but are temporary. Symptoms can include sudden:  Numbness or weakness on one side of the body. Especially to the:   Face.   Arm.   Leg.   Trouble speaking, thinking, or confusion.   Change in vision, such as trouble seeing in one or both eyes.   Dizziness, loss of balance, or difficulty walking.   Severe headache.  ANY OF THESE SYMPTOMS MAY REPRESENT A SERIOUS PROBLEM THAT IS AN EMERGENCY. Do not wait to see if the symptoms will go away. Get medical help at once. Call your local emergency services (911 in U[place] IMMEDIATELY. DO NOT drive yourself to the hospital. RISK FACTORS Risk factors can increase the risk of developing a TIA. These can include.   High blood pressure (hypertension).   High  cholesterol (hyperlipidemia).   Heart disease (atherosclerosis).   Smoking.   Diabetes.   Abnormal heart rhythm (atrial fibrillation).   Family history of a stroke or heart attack.   Use of oral contraceptives (especially when combined with smoking).  DIAGNOSIS   A TIA can be diagnosed based on your:   Symptoms.   History.   Risk factors.   Tests that can help diagnose the symptoms of a TIA include:   CT or MRI scan. These tests can provide detailed images of the brain.   Carotid ultrasound. This test looks to see if there are blockages in the carotid arteries of your neck.   Arteriography. A thin, small flexible tube (catheter) is inserted through a small cut (incision) in your groin. The catheter is threaded to your carotid or vertebral artery. A dye is then injected into the catheter. The dye highlights the arteries in your brain and allows your caregiver to look for narrowing or blockages that can cause a TIA.  TREATMENT  Based on the cause of a TIA, treatment options can vary. Treatment is important to help prevent a stroke. Treatment options can include:  Medication. Such as:   Clot-busting medicine.   Anti-platelet medicine.   Blood pressure medicine.   Blood thinner medicine.   Surgery:   Carotid endarterectomy. The carotid arteries are the arteries that supply the head and neck with oxygenated  blood. This surgery can help remove fatty deposits (plaque) in the carotid arteries.   Angioplasty and stenting. This surgery uses a balloon to dilate a blocked artery in the brain. A stent is a small, metal mesh tube that can help keep an artery open  HOME CARE INSTRUCTIONS   It is important to take all medicine as told by your caregiver. If the medicine has side effects that affect you negatively, tell your caregiver right away. Do not stop taking medicine unless told by your caregiver. Some medicines may need to be changed to better treat your condition.   Do not  smoke. Talk to your caregiver on how to quit smoking.   Eat a diet high in fruits, vegetables and lean meat. Avoid a high fat, high salt diet. A dietician can you help you make healthy food choices.   Maintain a healthy weight. Develop an exercise plan approved by your caregiver.  SEEK IMMEDIATE MEDICAL CARE IF:   You develop weakness or numbness on one side of your body.   You have problems thinking, speaking, or feel confused.   You have vision changes.   You feel dizzy, have trouble walking, or lose your balance.   You develop a severe headache.  MAKE SURE YOU:   Understand these instructions.   Will watch your condition.   Will get help right away if you are not doing well or get worse.  Document Released: 01/13/2005 Document Revised: 03/25/2011 Document Reviewed: 05/29/2009 [employer] Patient Information 2012 [place], [place].

## 2011-09-17 NOTE — Assessment & Plan Note (Addendum)
Episodes of dizziness, description fits TIA, other considerations are overtreatment of hypertension, bradycardia. Will adjust BP meds. MRA neck 8-12 was ok Plan: EKG--- bradycardia without acute changes Cardiovascular risk control CT head Echocardiogram ER if symptoms resurface

## 2011-09-17 NOTE — Assessment & Plan Note (Signed)
Perianal discomfort likely due to to hemorrhoids. She reports a history of internal hemorrhoids, last colonoscopy was one-2010. Plan: analpram, nupercainal

## 2011-09-17 NOTE — Assessment & Plan Note (Addendum)
BP today 140/80, patient reports ambulatory BPs as low as 110.  See dizziness--->  question of over treatment of hypertension and bradycardia.

## 2011-09-19 ENCOUNTER — Encounter: Payer: Self-pay | Admitting: Internal Medicine

## 2011-09-20 ENCOUNTER — Telehealth: Payer: Self-pay | Admitting: Internal Medicine

## 2011-09-20 NOTE — Telephone Encounter (Signed)
Noted , send my note from last week to Dr Neale Burly please

## 2011-09-20 NOTE — Telephone Encounter (Signed)
In reference to your Order for CT Head, patient just called and asked that I cancel that order.  Patient's states she already has a Insurance account manager, Dr. Neale Burly, and that she has an appointment with him today, and will have him order any necessary testing.

## 2011-09-21 ENCOUNTER — Other Ambulatory Visit: Payer: Medicare Other

## 2011-09-22 NOTE — Telephone Encounter (Signed)
Last office note was faxed to Dr. Neale Burly.

## 2011-09-29 ENCOUNTER — Ambulatory Visit (HOSPITAL_COMMUNITY): Payer: Medicare Other | Attending: Cardiovascular Disease | Admitting: Radiology

## 2011-09-29 DIAGNOSIS — I498 Other specified cardiac arrhythmias: Secondary | ICD-10-CM | POA: Insufficient documentation

## 2011-09-29 DIAGNOSIS — R42 Dizziness and giddiness: Secondary | ICD-10-CM | POA: Insufficient documentation

## 2011-09-29 DIAGNOSIS — I10 Essential (primary) hypertension: Secondary | ICD-10-CM | POA: Insufficient documentation

## 2011-09-29 DIAGNOSIS — G459 Transient cerebral ischemic attack, unspecified: Secondary | ICD-10-CM | POA: Insufficient documentation

## 2011-09-29 DIAGNOSIS — E785 Hyperlipidemia, unspecified: Secondary | ICD-10-CM | POA: Insufficient documentation

## 2011-09-29 NOTE — Progress Notes (Signed)
Echocardiogram performed.  

## 2011-10-22 ENCOUNTER — Other Ambulatory Visit (INDEPENDENT_AMBULATORY_CARE_PROVIDER_SITE_OTHER): Payer: Medicare Other

## 2011-10-22 DIAGNOSIS — E7800 Pure hypercholesterolemia, unspecified: Secondary | ICD-10-CM

## 2011-10-22 DIAGNOSIS — I10 Essential (primary) hypertension: Secondary | ICD-10-CM

## 2011-10-22 LAB — CBC WITH DIFFERENTIAL/PLATELET
Basophils Absolute: 0 10*3/uL (ref 0.0–0.1)
Eosinophils Absolute: 0.1 10*3/uL (ref 0.0–0.7)
HCT: 38.5 % (ref 36.0–46.0)
Hemoglobin: 12.8 g/dL (ref 12.0–15.0)
Lymphs Abs: 1.2 10*3/uL (ref 0.7–4.0)
MCHC: 33.3 g/dL (ref 30.0–36.0)
MCV: 98.9 fl (ref 78.0–100.0)
Neutro Abs: 3.2 10*3/uL (ref 1.4–7.7)
RDW: 14.1 % (ref 11.5–14.6)

## 2011-10-22 LAB — BASIC METABOLIC PANEL
CO2: 25 mEq/L (ref 19–32)
Calcium: 9.8 mg/dL (ref 8.4–10.5)
Chloride: 108 mEq/L (ref 96–112)
Sodium: 142 mEq/L (ref 135–145)

## 2011-10-22 LAB — LDL CHOLESTEROL, DIRECT: Direct LDL: 72.8 mg/dL

## 2011-10-22 LAB — ALT: ALT: 12 U/L (ref 0–35)

## 2011-10-22 NOTE — Progress Notes (Signed)
Labs only

## 2011-10-26 ENCOUNTER — Encounter: Payer: Self-pay | Admitting: *Deleted

## 2011-12-08 ENCOUNTER — Telehealth: Payer: Self-pay | Admitting: Internal Medicine

## 2011-12-08 NOTE — Telephone Encounter (Signed)
Please advise 

## 2011-12-08 NOTE — Telephone Encounter (Signed)
Pt states she is having outpatient shoulder surgery with Dr. Ranell Patrick at [facility] and she needs a letter from Dr. Drue Novel stating she is okay to have the surgery. I advised pt that she may need an OV for clearance, but that would be up to Dr. Drue Novel.

## 2011-12-09 NOTE — Telephone Encounter (Signed)
Discussed with pt. Please schedule pt on 8.27.13 @1pm  for surgical clearance appt.

## 2011-12-09 NOTE — Telephone Encounter (Signed)
Schedule a OV please

## 2011-12-14 ENCOUNTER — Ambulatory Visit (INDEPENDENT_AMBULATORY_CARE_PROVIDER_SITE_OTHER): Payer: Medicare Other | Admitting: Internal Medicine

## 2011-12-14 ENCOUNTER — Encounter: Payer: Self-pay | Admitting: Internal Medicine

## 2011-12-14 VITALS — BP 120/70 | HR 69 | Temp 98.0°F | Wt 157.0 lb

## 2011-12-14 DIAGNOSIS — E785 Hyperlipidemia, unspecified: Secondary | ICD-10-CM

## 2011-12-14 DIAGNOSIS — I10 Essential (primary) hypertension: Secondary | ICD-10-CM

## 2011-12-14 DIAGNOSIS — Z01818 Encounter for other preprocedural examination: Secondary | ICD-10-CM

## 2011-12-14 DIAGNOSIS — R42 Dizziness and giddiness: Secondary | ICD-10-CM

## 2011-12-14 NOTE — Assessment & Plan Note (Signed)
Well-controlled, no change 

## 2011-12-14 NOTE — Patient Instructions (Addendum)
We are clearing you for surgery, will let your surgeon know

## 2011-12-14 NOTE — Progress Notes (Signed)
  Subjective:    Patient ID: [name], female    DOB: 08-Nov-1929, 78 y.o.   MRN: [id number]  HPI Here for surgical clearance Patient needs left shoulder arthroscopy. Complains of a constant ache, motion of the shoulder make the pain quite severe; having a hard time sleeping Currently doing well, good compliance with medicines. She denies chest pain or shortness of breath, she has some dyspnea on exertion which is nothing new to her. No orthopnea. No edema. Denies any nausea, vomiting, diarrhea. Denies any cough or wheezing. She was seen with dizziness a few months ago, she is asymptomatic since, reports she saw neurology, got good reports, office visit notes not available at this time    Past Medical History  Diagnosis Date  . Hyperlipidemia   . Hypertension   . Anxiety and depression   . Pulmonary nodules     Wert. First seen by CT only 05/04/03, no change on f/u 08/08/06 -Macroscopic changes rml 04/27/07 > resolved april 6,2010 no further w/u   . Knee pain     right chronic  . Back pain     lumbar  . SVT (supraventricular tachycardia)     sees Dr.Ross  . Hydronephrosis     Right 2008 s/p urology eval CT/cysto: observation/monitor  . Headache   . GI (gastrointestinal bleed)     hx of colon polyps 2004, diverticulosis, internal hemmorhids   Past Surgical History  Procedure Date  . Abdominal hysterectomy   . Rotator cuff repair 9/-2011  . Total knee arthroplasty     right  . Cataract extraction 2008    Dr.Digby  . R ovary removed   . Lumbar reconstructive surgery 01/07/2009  . Appendectomy   . Cholecystectomy   . Tonsillectomy   . Bladder repair    History   Social History  . Marital Status: Widowed    Spouse Name: N/A    Number of Children: 1  . Years of Education: N/A   Occupational History  . retired     Social History Main Topics  . Smoking status: Never Smoker   . Smokeless tobacco: Never Used  . Alcohol Use: Yes     rarely  . Drug Use: No  .  Sexually Active: Not on file   Other Topics Concern  . 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 No apparent distress.  Neck --no JVD Lungs -- normal respiratory effort, no intercostal retractions, no accessory muscle use, and normal breath sounds.   Heart-- normal rate, regular rhythm, no murmur, and no gallop.   Abdomen--soft, non-tender, no distention, no masses, no HSM, no guarding, and no rigidity.   Extremities-- no pretibial edema bilaterally  Neurologic-- alert & oriented X3 and strength normal in all extremities. Psych-- Cognition and judgment appear intact. Alert and cooperative with normal attention span and concentration.  not anxious appearing and not depressed appearing.        Assessment & Plan:

## 2011-12-14 NOTE — Assessment & Plan Note (Addendum)
78 year old lady with history of hypertension high cholesterol, needs a left shoulder arthroscopy. Chart is reviewed, had a normal echocardiogram 09/2011. MRA of the neck 11-2010 was normal Recent labs normal EKG 08/2011 showed sinus bradycardia. I think she is an acceptable risk for surgery given the amount of pain she has. We'll clear her for surgery. Okay to stop aspirin few  days prior to the procedure if needed . She takes a supplement for  dry eye syndrome called Hydroeye, according to the patient it has some "blood thinner properties" so  I recommend to stop that before surgery. Will CC note to ortho

## 2011-12-14 NOTE — Assessment & Plan Note (Addendum)
Based on last cholesterol panel, she started niacin. Reports good compliance and tolerance

## 2011-12-14 NOTE — Assessment & Plan Note (Signed)
No further episodes, echocardiogram normal. Was seen by neurology, will try to get records

## 2011-12-19 HISTORY — PX: SHOULDER SURGERY: SHX246

## 2012-01-17 ENCOUNTER — Telehealth: Payer: Self-pay

## 2012-01-17 NOTE — Telephone Encounter (Signed)
Call pt back to advise pt what doctor recommended. Pt states understanding.    MW

## 2012-01-17 NOTE — Telephone Encounter (Signed)
Pt states been constipated since surgery on 01/03/12. Pt states has had a lot of percusets.Pt states taken stool softners, supositories, and a lot of prune juice and nothing is working. Pt states in a lot of pain and weak. Plz advise     MW

## 2012-01-17 NOTE — Telephone Encounter (Signed)
Needs to discuss pain with her surgeon,  Percocet may be causing the constipation. Recommend MiraLax 17 g with water everyday to help with constipation. If abdominal pain, nausea vomiting:   ER

## 2012-02-03 ENCOUNTER — Other Ambulatory Visit: Payer: Self-pay | Admitting: Internal Medicine

## 2012-02-04 NOTE — Telephone Encounter (Signed)
Refill done.  

## 2012-02-14 ENCOUNTER — Other Ambulatory Visit: Payer: Self-pay | Admitting: Obstetrics and Gynecology

## 2012-02-14 DIAGNOSIS — Z1231 Encounter for screening mammogram for malignant neoplasm of breast: Secondary | ICD-10-CM

## 2012-02-25 ENCOUNTER — Other Ambulatory Visit (HOSPITAL_COMMUNITY): Payer: Self-pay | Admitting: Orthopedic Surgery

## 2012-02-25 DIAGNOSIS — M25561 Pain in right knee: Secondary | ICD-10-CM

## 2012-02-29 ENCOUNTER — Ambulatory Visit (HOSPITAL_COMMUNITY): Payer: Medicare Other

## 2012-02-29 ENCOUNTER — Encounter (HOSPITAL_COMMUNITY): Payer: Medicare Other

## 2012-03-13 ENCOUNTER — Telehealth: Payer: Self-pay | Admitting: Internal Medicine

## 2012-03-13 MED ORDER — PSYLLIUM 0.52 G PO CAPS: ORAL_CAPSULE | ORAL | Status: DC

## 2012-03-13 NOTE — Telephone Encounter (Signed)
Patient left msg on triage line stating she would like rx for metamucil sent to [facility] on [address]. CB# [phone]

## 2012-03-13 NOTE — Telephone Encounter (Signed)
Refill done.  

## 2012-03-13 NOTE — Telephone Encounter (Signed)
Metamucil is not on pt's current med list. OK to send rx to pharmacy?

## 2012-03-13 NOTE — Telephone Encounter (Signed)
She probably needs a prescription to get this OTC covered by her insurance. Call: Metamucil : capsule 1 or 2  by mouth daily with lots of fluids, #100, 1RF

## 2012-03-23 ENCOUNTER — Ambulatory Visit (HOSPITAL_COMMUNITY)
Admission: RE | Admit: 2012-03-23 | Discharge: 2012-03-23 | Disposition: A | Discharge status: Home / Self Care | Payer: Medicare Other | Source: Ambulatory Visit | Attending: Orthopedic Surgery | Admitting: Orthopedic Surgery

## 2012-03-23 ENCOUNTER — Encounter (HOSPITAL_COMMUNITY)
Admission: RE | Admit: 2012-03-23 | Discharge: 2012-03-23 | Disposition: A | Discharge status: Home / Self Care | Payer: Medicare Other | Source: Ambulatory Visit | Attending: Orthopedic Surgery | Admitting: Orthopedic Surgery

## 2012-03-23 DIAGNOSIS — Z96659 Presence of unspecified artificial knee joint: Secondary | ICD-10-CM | POA: Insufficient documentation

## 2012-03-23 DIAGNOSIS — M25569 Pain in unspecified knee: Secondary | ICD-10-CM | POA: Insufficient documentation

## 2012-03-23 DIAGNOSIS — M25469 Effusion, unspecified knee: Secondary | ICD-10-CM | POA: Insufficient documentation

## 2012-03-23 DIAGNOSIS — M25561 Pain in right knee: Secondary | ICD-10-CM

## 2012-03-23 MED ORDER — TECHNETIUM TC 99M MEDRONATE IV KIT
25.0000 | PACK | Freq: Once | INTRAVENOUS | Status: AC | PRN
  Administered 2012-03-23: 25 via INTRAVENOUS

## 2012-03-27 ENCOUNTER — Other Ambulatory Visit: Payer: Self-pay | Admitting: Internal Medicine

## 2012-03-27 ENCOUNTER — Ambulatory Visit
Admission: RE | Admit: 2012-03-27 | Discharge: 2012-03-27 | Disposition: A | Discharge status: Home / Self Care | Payer: Medicare Other | Source: Ambulatory Visit | Attending: Obstetrics and Gynecology | Admitting: Obstetrics and Gynecology

## 2012-03-27 DIAGNOSIS — Z1231 Encounter for screening mammogram for malignant neoplasm of breast: Secondary | ICD-10-CM

## 2012-04-05 ENCOUNTER — Encounter: Payer: Self-pay | Admitting: *Deleted

## 2012-04-05 ENCOUNTER — Ambulatory Visit (INDEPENDENT_AMBULATORY_CARE_PROVIDER_SITE_OTHER)
Admission: RE | Admit: 2012-04-05 | Discharge: 2012-04-05 | Disposition: A | Discharge status: Home / Self Care | Payer: Medicare Other | Source: Ambulatory Visit | Attending: Internal Medicine | Admitting: Internal Medicine

## 2012-04-05 ENCOUNTER — Encounter: Payer: Self-pay | Admitting: Internal Medicine

## 2012-04-05 ENCOUNTER — Ambulatory Visit (INDEPENDENT_AMBULATORY_CARE_PROVIDER_SITE_OTHER): Payer: Medicare Other | Admitting: Internal Medicine

## 2012-04-05 VITALS — BP 110/70 | HR 73 | Temp 98.1°F | Wt 157.0 lb

## 2012-04-05 DIAGNOSIS — R609 Edema, unspecified: Secondary | ICD-10-CM

## 2012-04-05 DIAGNOSIS — R61 Generalized hyperhidrosis: Secondary | ICD-10-CM

## 2012-04-05 DIAGNOSIS — R062 Wheezing: Secondary | ICD-10-CM

## 2012-04-05 LAB — CBC WITH DIFFERENTIAL/PLATELET
Basophils Absolute: 0 10*3/uL (ref 0.0–0.1)
Eosinophils Relative: 2.8 % (ref 0.0–5.0)
MCV: 94.1 fl (ref 78.0–100.0)
Monocytes Absolute: 0.5 10*3/uL (ref 0.1–1.0)
Monocytes Relative: 7.6 % (ref 3.0–12.0)
Neutrophils Relative %: 68.1 % (ref 43.0–77.0)
Platelets: 501 10*3/uL — ABNORMAL HIGH (ref 150.0–400.0)
WBC: 6.9 10*3/uL (ref 4.5–10.5)

## 2012-04-05 NOTE — Progress Notes (Signed)
Subjective:    Patient ID: [name], female    DOB: Dec 07, 1929, 78 y.o.   MRN: [id number]  HPI Acute visit, several issues: --Complains of sweats, day or night, "every 6 hours",  different from previous hot flashes; she used to take Premarin up to a year ago. Symptoms are going on since about September, the only thing she is doing different is she is getting Ultram as needed. --10 days history of swelling in the ankles, worse at the end of the day, right slightly worse than left. --10 days history of wheezing, no history of asthma or tobacco abuse. --Long history of "urine odor" on and off for a while.  Past Medical History  Diagnosis Date  . Hyperlipidemia   . Hypertension   . Anxiety and depression   . Pulmonary nodules     Wert. First seen by CT only 05/04/03, no change on f/u 08/08/06 -Macroscopic changes rml 04/27/07 > resolved april 6,2010 no further w/u   . Knee pain     right chronic  . Back pain     lumbar  . SVT (supraventricular tachycardia)     sees Dr.Ross  . Hydronephrosis     Right 2008 s/p urology eval CT/cysto: observation/monitor  . Headache   . GI (gastrointestinal bleed)     hx of colon polyps 2004, diverticulosis, internal hemmorhids   Past Surgical History  Procedure Date  . Abdominal hysterectomy   . Rotator cuff repair 9/-2011  . Total knee arthroplasty     right  . Cataract extraction 2008    Dr.Digby  . R ovary removed   . Lumbar reconstructive surgery 01/07/2009  . Appendectomy   . Cholecystectomy   . Tonsillectomy   . Bladder repair   . Shoulder surgery 12-2011    LEFT   History   Social History  . Marital Status: Widowed    Spouse Name: N/A    Number of Children: 1  . Years of Education: N/A   Occupational History  . retired     Social History Main Topics  . Smoking status: Never Smoker   . Smokeless tobacco: Never Used  . Alcohol Use: Yes     Comment: rarely  . Drug Use: No  . Sexually Active: Not on file   Other  Topics Concern  . 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 No vaginal discharge, dysuria or gross hematuria. Did have surgery in her shoulder 12-2011.     Objective:   Physical Exam General -- alert, well-developed, and well-nourished.  Healthy-appearing, vital signs stable Neck --no LADs, no JVD at 45 degrees HEENT -- TMs normal, throat w/o redness, face symmetric and not tender to palpation, nose not congested Lungs -- normal respiratory effort, no intercostal retractions, no accessory muscle use, and normal breath sounds.   Heart-- normal rate, regular rhythm, no murmur, and no gallop.   Abdomen--soft, non-tender, no distention, no masses, no HSM, no guarding, and no rigidity.  No CVA tenderness Extremities-- trace peri-ankle edema bilaterally Psych-- Cognition and judgment appear intact. Alert and cooperative with normal attention span and concentration.  not anxious appearing and not depressed appearing.       Assessment & Plan:   Sweats, Healthy-appearing  78 year old lady with sweats on and off, no fever chills, no weight loss. Symptoms are temporarily related to Ultram. Plan: Chest x-ray  , CBC Hold Ultram, see instructions  UTI? Check a UA dip ---> (-)  Wheezing, lower extremity edema, Cardiovascular exam is normal, no evidence of volume overloaded, she does have mild  ankle edema. Plan:  chest x-ray (? Cardiac asthma ) Leg elevation Observation  Today , I spent more than 25  min with the patient, 3 acute issues

## 2012-04-05 NOTE — Patient Instructions (Addendum)
Please get your x-ray at the other [facility]  office located at: [address] [place], across from [facility].  Please go to the basement, this is a walk-in facility, they are open from 8:30 to 5:30 PM. Phone number [phone]. --- Leg elevation at least one hour in the morning and one of in the afternoon Low-salt diet ---- Hold ultram for 2 weeks, see if that to stop the sweats --- Come back in a month for re-evaluation

## 2012-04-07 ENCOUNTER — Encounter: Payer: Self-pay | Admitting: *Deleted

## 2012-04-20 ENCOUNTER — Encounter: Payer: Self-pay | Admitting: Internal Medicine

## 2012-04-20 ENCOUNTER — Ambulatory Visit (INDEPENDENT_AMBULATORY_CARE_PROVIDER_SITE_OTHER): Payer: Medicare Other | Admitting: Internal Medicine

## 2012-04-20 VITALS — BP 122/80 | HR 75 | Temp 98.0°F | Wt 154.0 lb

## 2012-04-20 DIAGNOSIS — E538 Deficiency of other specified B group vitamins: Secondary | ICD-10-CM

## 2012-04-20 DIAGNOSIS — R5381 Other malaise: Secondary | ICD-10-CM

## 2012-04-20 DIAGNOSIS — R5383 Other fatigue: Secondary | ICD-10-CM

## 2012-04-20 DIAGNOSIS — N390 Urinary tract infection, site not specified: Secondary | ICD-10-CM

## 2012-04-20 DIAGNOSIS — D649 Anemia, unspecified: Secondary | ICD-10-CM

## 2012-04-20 DIAGNOSIS — Z1321 Encounter for screening for nutritional disorder: Secondary | ICD-10-CM

## 2012-04-20 LAB — HEMOGLOBIN: Hemoglobin: 12.6 g/dL (ref 12.0–15.0)

## 2012-04-20 LAB — BASIC METABOLIC PANEL
BUN: 16 mg/dL (ref 6–23)
CO2: 25 mEq/L (ref 19–32)
Chloride: 109 mEq/L (ref 96–112)
Creatinine, Ser: 1 mg/dL (ref 0.4–1.2)

## 2012-04-20 LAB — URINALYSIS
Bilirubin Urine: NEGATIVE
Ketones, ur: NEGATIVE
Total Protein, Urine: NEGATIVE
pH: 5.5 (ref 5.0–8.0)

## 2012-04-20 LAB — FOLATE: Folate: >24.8 ng/mL (ref >5.9)

## 2012-04-20 LAB — TSH: TSH: 1.64 u[IU]/mL (ref 0.35–5.50)

## 2012-04-20 MED ORDER — PSYLLIUM 0.52 G PO CAPS: ORAL_CAPSULE | ORAL | Status: DC

## 2012-04-20 NOTE — Progress Notes (Addendum)
  Subjective:    Patient ID: [name], female    DOB: 10-18-1929, 79 y.o.   MRN: [id number]  HPI Acute visit, her chief complaint today is fatigue. She describes her symptoms as tiredness, "too tired to do anything, I didn't do much during the holidays", "dragging ". Symptoms started after she was last seen here 04/05/2012. She denies chest pain or shortness or breath. Only gets dyspnea on exertion if she overexert, she is okay with regular activities. She was also seen with edema, that is resolved after she discontinue tramadol. She was recently seen with sweats, see last visit, sweats are decreasing but is still there. No cough. No dysuria gross hematuria No claudication per se. Had one episode of diarrhea around 04/06/2012, no further GI symptoms  Past Medical History  Diagnosis Date  . Hyperlipidemia   . Hypertension   . Anxiety and depression   . Pulmonary nodules     Wert. First seen by CT only 05/04/03, no change on f/u 08/08/06 -Macroscopic changes rml 04/27/07 > resolved april 6,2010 no further w/u   . Knee pain     right chronic  . Back pain     lumbar  . SVT (supraventricular tachycardia)     sees Dr.Ross  . Hydronephrosis     Right 2008 s/p urology eval CT/cysto: observation/monitor  . Headache   . GI (gastrointestinal bleed)     hx of colon polyps 2004, diverticulosis, internal hemmorhids   Past Surgical History  Procedure Date  . Abdominal hysterectomy   . Rotator cuff repair 9/-2011  . Total knee arthroplasty     right  . Cataract extraction 2008    Dr.Digby  . R ovary removed   . Lumbar reconstructive surgery 01/07/2009  . Appendectomy   . Cholecystectomy   . Tonsillectomy   . Bladder repair   . Shoulder surgery 12-2011    LEFT     Review of Systems See HPI    Objective:   Physical Exam General -- alert, well-developed, and well-nourished.   Lungs -- normal respiratory effort, no intercostal retractions, no accessory muscle use, and normal  breath sounds.   Heart-- normal rate, regular rhythm, no murmur, and no gallop.   Extremities-- no pretibial edema bilaterally Neurologic-- alert & oriented X3 and strength normal in all extremities. PHQ 9---> 3 Psych-- Cognition and judgment appear intact. Alert and cooperative with normal attention span and concentration.  not anxious appearing and not depressed appearing.      Assessment & Plan:  Fatigue, Chief complaint today is fatigue. PHQ 9 showed minimal depression . Recent chest x-ray was normal. She had mild anemia. Etiology of fatigue not clear. Plan: Reassess anemia by checking a hemoglobin, iron, ferritin, B12-folic acid to r/o deficiency  History of sweats, slightly better.  UTI? Still thinks she has a UTI, "strong  Urine odor" we order a UA at the last visit but sample was not large enough. Recheck a UA

## 2012-04-21 ENCOUNTER — Encounter: Payer: Self-pay | Admitting: *Deleted

## 2012-05-03 ENCOUNTER — Other Ambulatory Visit: Payer: Self-pay | Admitting: Internal Medicine

## 2012-05-04 NOTE — Telephone Encounter (Signed)
done

## 2012-05-04 NOTE — Telephone Encounter (Signed)
Ok to refill? Last OV 1.2.14 Last filled 12.12.12

## 2012-05-08 ENCOUNTER — Ambulatory Visit: Payer: Medicare Other | Admitting: Internal Medicine

## 2012-05-12 ENCOUNTER — Encounter: Payer: Self-pay | Admitting: Internal Medicine

## 2012-05-19 ENCOUNTER — Encounter: Payer: Self-pay | Admitting: Internal Medicine

## 2012-05-19 ENCOUNTER — Ambulatory Visit (INDEPENDENT_AMBULATORY_CARE_PROVIDER_SITE_OTHER): Payer: Medicare Other | Admitting: Internal Medicine

## 2012-05-19 VITALS — BP 112/70 | HR 70 | Temp 98.1°F | Wt 154.0 lb

## 2012-05-19 DIAGNOSIS — R5383 Other fatigue: Secondary | ICD-10-CM

## 2012-05-19 DIAGNOSIS — R5381 Other malaise: Secondary | ICD-10-CM

## 2012-05-19 NOTE — Progress Notes (Signed)
  Subjective:    Patient ID: [name], female    DOB: 1929/06/03, 79 y.o.   MRN: [id number]  HPI Followup from previous visit. Since the last time she was here, she is feeling better. Medications are essentially the same. She feels more energetic. Sleeping well. Swelling is well-controlled with leg elevation.  Past Medical History  Diagnosis Date  . Hyperlipidemia   . Hypertension   . Anxiety and depression   . Pulmonary nodules     Wert. First seen by CT only 05/04/03, no change on f/u 08/08/06 -Macroscopic changes rml 04/27/07 > resolved april 6,2010 no further w/u   . Knee pain     right chronic  . Back pain     lumbar  . SVT (supraventricular tachycardia)     sees Dr.Ross  . Hydronephrosis     Right 2008 s/p urology eval CT/cysto: observation/monitor  . Headache   . GI (gastrointestinal bleed)     hx of colon polyps 2004, diverticulosis, internal hemmorhids   Past Surgical History  Procedure Date  . Abdominal hysterectomy   . Rotator cuff repair 9/-2011  . Total knee arthroplasty     right  . Cataract extraction 2008    Dr.Digby  . R ovary removed   . Lumbar reconstructive surgery 01/07/2009  . Appendectomy   . Cholecystectomy   . Tonsillectomy   . Bladder repair   . Shoulder surgery 12-2011    LEFT   History   Social History  . Marital Status: Widowed    Spouse Name: N/A    Number of Children: 1  . Years of Education: N/A   Occupational History  . retired     Social History Main Topics  . Smoking status: Never Smoker   . Smokeless tobacco: Never Used  . Alcohol Use: Yes     Comment: rarely  . Drug Use: No  . Sexually Active: Not on file   Other Topics Concern  . Not on file   Social History Narrative   Moved to a retirement home 05/2008 [place], has 3 meals a day, has a cleaning service---Son lives in [place]      Review of Systems     Objective:   Physical Exam General -- alert, well-developed, and well-nourished.     Lungs -- normal respiratory effort, no intercostal retractions, no accessory muscle use, and normal breath sounds.   Heart-- normal rate, regular rhythm, no murmur, and no gallop.    Psych-- Cognition and judgment appear intact. Alert and cooperative with normal attention span and concentration.  not anxious appearing and not depressed appearing.       Assessment & Plan:   Fatigue, Recent labs normal. She feels better. No further eval at this point. UTI? Recent UA was negative, she still complains of strong urine odor from time to time. Recommend observation

## 2012-05-19 NOTE — Patient Instructions (Addendum)
Please come back in 5-6 months fasting for a physical exam

## 2012-06-03 ENCOUNTER — Other Ambulatory Visit: Payer: Self-pay | Admitting: Internal Medicine

## 2012-06-05 NOTE — Telephone Encounter (Signed)
Refill done.  

## 2012-06-09 ENCOUNTER — Ambulatory Visit: Payer: Medicare Other | Admitting: Internal Medicine

## 2012-10-23 ENCOUNTER — Other Ambulatory Visit: Payer: Self-pay | Admitting: Internal Medicine

## 2012-10-23 NOTE — Telephone Encounter (Signed)
Refill done. Verified with Lindsay Carson CMA 

## 2012-10-31 ENCOUNTER — Encounter: Payer: Medicare Other | Admitting: Internal Medicine

## 2012-11-01 ENCOUNTER — Ambulatory Visit (INDEPENDENT_AMBULATORY_CARE_PROVIDER_SITE_OTHER): Payer: Medicare PPO | Admitting: Internal Medicine

## 2012-11-01 ENCOUNTER — Encounter: Payer: Self-pay | Admitting: Internal Medicine

## 2012-11-01 DIAGNOSIS — M79609 Pain in unspecified limb: Secondary | ICD-10-CM

## 2012-11-01 NOTE — Progress Notes (Addendum)
  Subjective:    Patient ID: [name], female    DOB: February 04, 1930, 79 y.o.   MRN: [id number]  HPI Acute visit: 2 months history of pain and swelling at the R 4th finger (PIP), 2 weeks history of similar symptoms at the L 2nd finger (PIP). She has not taken any specific medications but interestingly she will start Celebrex for an unrelated pain. Reports that she saw her kidney doctor recently and she was given the okay to take Celebrex.  Past Medical History  Diagnosis Date  . Hyperlipidemia   . Hypertension   . Anxiety and depression   . Pulmonary nodules     Wert. First seen by CT only 05/04/03, no change on f/u 08/08/06 -Macroscopic changes rml 04/27/07 > resolved april 6,2010 no further w/u   . Knee pain     right chronic  . Back pain     lumbar  . SVT (supraventricular tachycardia)     sees Dr.Ross  . Hydronephrosis     Right 2008 s/p urology eval CT/cysto: observation/monitor  . Headache   . GI (gastrointestinal bleed)     hx of colon polyps 2004, diverticulosis, internal hemmorhids   Past Surgical History  Procedure Laterality Date  . Abdominal hysterectomy    . Rotator cuff repair  9/-2011  . Total knee arthroplasty      right  . Cataract extraction  2008    Dr.Digby  . R ovary removed    . Lumbar reconstructive surgery  01/07/2009  . Appendectomy    . Cholecystectomy    . Tonsillectomy    . Bladder repair    . Shoulder surgery  12-2011    LEFT      Review of Systems Denies any injury. No fever or chills. She is scheduled to see Dr. Amanda Pea 11-16-2012 for consideration of surgery at the base of the right thumb. Denies trigger  phenomenon    Objective:   Physical Exam BP 135/70  Pulse 70  Temp(Src) 97.5 F (36.4 C) (Oral)  Wt 151 lb 12.8 oz (68.856 kg)  BMI 25.66 kg/m2  SpO2 97%  General -- alert, well-developed, NAD .   Extremities--  Hands and wrists: No synovitis on exam, changes and deformities consistent with DJD. The joints in question  showed no warmness-redness but they are slt more tender that other PIPs. also mild tenderness at the flexor tendons of those digits. Psych-- Cognition and judgment appear intact. Alert and cooperative with normal attention span and concentration.  not anxious appearing and not depressed appearing.      Assessment & Plan:  Finger pain. Pain likely a combination of DJD and tendinitis. I don't think she has a  inflammatory arthritis. Recommend conservative treatment, she will start celebrex, okay by her nephrologist. Recommend to discuss above symptoms with her hand surgeon Dr. Amanda Pea in the upcoming visit, she may benefit from a local injection.  Addendum: Note from Dr. Briant Cedar 10/31/2012 reviewed: He indeed ok  Celebrex, he also strongly recommended avoid ACE-ARBs  while taking Celebrex.

## 2012-11-01 NOTE — Patient Instructions (Addendum)
Please keep your appointment in few weeks

## 2012-11-20 ENCOUNTER — Encounter: Payer: Self-pay | Admitting: Nephrology

## 2012-11-28 ENCOUNTER — Other Ambulatory Visit: Payer: Self-pay | Admitting: Internal Medicine

## 2012-11-28 NOTE — Telephone Encounter (Signed)
Ok for one year supply per verbal order Dr. Drue Novel. Pt. Requests 160 capsules able to give 11 month supply with requested quantity. Orders placed per Dr. Drue Novel.

## 2012-12-02 ENCOUNTER — Other Ambulatory Visit: Payer: Self-pay | Admitting: Internal Medicine

## 2012-12-04 ENCOUNTER — Other Ambulatory Visit: Payer: Self-pay | Admitting: Internal Medicine

## 2012-12-04 NOTE — Telephone Encounter (Signed)
Refill done for one month per protocol.

## 2012-12-04 NOTE — Telephone Encounter (Signed)
Cleared with Dr. Drue Novel ok to send 90 day supply. Spoke with pharmacy asked to cancel 30 day supply sent in this morning. rx resent.

## 2012-12-05 ENCOUNTER — Other Ambulatory Visit (INDEPENDENT_AMBULATORY_CARE_PROVIDER_SITE_OTHER): Payer: Medicare PPO

## 2012-12-05 ENCOUNTER — Telehealth: Payer: Self-pay | Admitting: Internal Medicine

## 2012-12-05 DIAGNOSIS — R51 Headache: Secondary | ICD-10-CM

## 2012-12-05 NOTE — Telephone Encounter (Signed)
Orders entered, lmovm to return call to office.

## 2012-12-05 NOTE — Telephone Encounter (Signed)
Phone call from the PA at Dr. Randon Goldsmith office. Patient presents there for a routine eye check, she complained of headache x 1 month, she is tender at the temple areas. No visual disturbances, eye exam is essentially negative. They request further eval for headaches Plan: Enter a order for CBC, sedimentation rate, BMP--- dx HA,  she will walk in today for labs. Schedule a visit for tomorrow afternoon. Advise patient, ER if symptoms severe, fever, visual disturbances

## 2012-12-05 NOTE — Telephone Encounter (Signed)
Message left on voicemail, patient is returning call [phone], [phone]

## 2012-12-05 NOTE — Telephone Encounter (Signed)
Attempted to contact pt. Again on mobile and home phone line. lmovm to return call to office.

## 2012-12-06 ENCOUNTER — Ambulatory Visit (INDEPENDENT_AMBULATORY_CARE_PROVIDER_SITE_OTHER): Payer: Medicare PPO | Admitting: Internal Medicine

## 2012-12-06 ENCOUNTER — Encounter: Payer: Self-pay | Admitting: Internal Medicine

## 2012-12-06 VITALS — BP 110/60 | HR 62 | Temp 98.4°F | Wt 147.2 lb

## 2012-12-06 DIAGNOSIS — R51 Headache: Secondary | ICD-10-CM

## 2012-12-06 LAB — BASIC METABOLIC PANEL
CO2: 27 mEq/L (ref 19–32)
Calcium: 9.8 mg/dL (ref 8.4–10.5)
Creatinine, Ser: 1 mg/dL (ref 0.4–1.2)
GFR: 54.08 mL/min — ABNORMAL LOW (ref >60.00)
Sodium: 138 mEq/L (ref 135–145)

## 2012-12-06 LAB — CBC
HCT: 37.3 % (ref 36.0–46.0)
MCV: 97 fl (ref 78.0–100.0)
RBC: 3.84 Mil/uL — ABNORMAL LOW (ref 3.87–5.11)
RDW: 13.4 % (ref 11.5–14.6)

## 2012-12-06 LAB — SEDIMENTATION RATE: Sed Rate: 8 mm/hr (ref 0–22)

## 2012-12-06 NOTE — Patient Instructions (Addendum)
Please call if the headache gets intense or you have any problems with your vision. We will call you when labs become available.

## 2012-12-06 NOTE — Progress Notes (Signed)
Subjective:    Patient ID: [name], female    DOB: [birth year], 79 y.o.   MRN: [id number]  HPI Acute visit, went to see her ophthalmology yesterday and was referred for eval of headaches. The patient reports 2 months history of headaches, located in the right temple and behind the right eye, decreased with Tylenol, pain is steady and never more than 5/10 in intensity. Pain is different that her regular migraines, she takes Topamax regularly. No  left-sided symptoms. The eye exam yesterday is reportedly normal.  Past Medical History  Diagnosis Date  . Hyperlipidemia   . Hypertension   . Anxiety and depression   . Pulmonary nodules     Wert. First seen by CT only 05/04/03, no change on f/u 08/08/06 -Macroscopic changes rml 04/27/07 > resolved april 6,2010 no further w/u   . Knee pain     right chronic  . Back pain     lumbar  . SVT (supraventricular tachycardia)     sees Dr.Ross  . Hydronephrosis     Right 2008 s/p urology eval CT/cysto: observation/monitor  . Headache(784.0)   . GI (gastrointestinal bleed)     hx of colon polyps 2004, diverticulosis, internal hemmorhids   Past Surgical History  Procedure Laterality Date  . Abdominal hysterectomy    . Rotator cuff repair  9/-2011  . Total knee arthroplasty      right  . Cataract extraction  2008    Dr.Digby  . R ovary removed    . Lumbar reconstructive surgery  01/07/2009  . Appendectomy    . Cholecystectomy    . Tonsillectomy    . Bladder repair    . Shoulder surgery  12-2011    LEFT   History   Social History  . Marital Status: Widowed    Spouse Name: N/A    Number of Children: 1  . Years of Education: N/A   Occupational History  . retired     Social History Main Topics  . Smoking status: Never Smoker   . Smokeless tobacco: Never Used  . Alcohol Use: Yes     Comment: rarely  . Drug Use: No  . Sexual Activity: Not on file   Other Topics Concern  . Not on file   Social History Narrative   Moved  to a retirement home 05/2008 [place], has 3 meals a day, has a Pensions consultant    Son lives in [place]     Review of Systems Not taking any new medications No fever or chills. Some weight loss, on purpose, eating healthier . Denies visual disturbances specifically no diplopia, amaurosis fugax. Neck pain at baseline or better, Celebrex is helping. No sinus pain, mild sinus congestion and clear discharge from the nose from time to time. No facial numbness or slurred speech. No myalgias per se, occasionally pain in her buttocks/hips.    Objective:   Physical Exam General -- alert, well-developed, NAD.  Neck -- normal carotid pulse  HEENT-- Not pale. TMs normal, throat symmetric, no redness or discharge. Face symmetric, sinuses not tender to palpation. Nose not congested. Good temporal pulse bilaterally, R temple slt tender w/o swelling  Extremities-- no pretibial edema bilaterally , muscle mass no TTP on the extremities  Neurologic-- alert & oriented X3. Speech, gait normal.EOMI, strength normal in all extremities.  Psych-- Cognition and judgment appear intact. Alert and cooperative with normal attention span and concentration. not anxious appearing and not depressed appearing.  Assessment & Plan:

## 2012-12-06 NOTE — Assessment & Plan Note (Addendum)
Persisting headache as described as a history of present illness. Headache may represent tensional HA, T. Arteritis, cluster HA etc PMR is another possible issue. CBC, sedimentation rate and BMP pending. Further advice w/  result

## 2012-12-06 NOTE — Telephone Encounter (Signed)
discussed with patient, verbalized understanding.  

## 2012-12-07 ENCOUNTER — Encounter: Payer: Self-pay | Admitting: Internal Medicine

## 2012-12-20 ENCOUNTER — Encounter: Payer: Self-pay | Admitting: Internal Medicine

## 2012-12-20 ENCOUNTER — Ambulatory Visit (INDEPENDENT_AMBULATORY_CARE_PROVIDER_SITE_OTHER): Payer: Medicare PPO | Admitting: Internal Medicine

## 2012-12-20 VITALS — BP 116/72 | HR 60 | Temp 98.5°F | Wt 146.8 lb

## 2012-12-20 DIAGNOSIS — I10 Essential (primary) hypertension: Secondary | ICD-10-CM

## 2012-12-20 DIAGNOSIS — M25569 Pain in unspecified knee: Secondary | ICD-10-CM

## 2012-12-20 DIAGNOSIS — Z0000 Encounter for general adult medical examination without abnormal findings: Secondary | ICD-10-CM

## 2012-12-20 DIAGNOSIS — M549 Dorsalgia, unspecified: Secondary | ICD-10-CM

## 2012-12-20 DIAGNOSIS — R51 Headache: Secondary | ICD-10-CM

## 2012-12-20 DIAGNOSIS — Z23 Encounter for immunization: Secondary | ICD-10-CM

## 2012-12-20 DIAGNOSIS — Z13820 Encounter for screening for osteoporosis: Secondary | ICD-10-CM

## 2012-12-20 DIAGNOSIS — Z8679 Personal history of other diseases of the circulatory system: Secondary | ICD-10-CM

## 2012-12-20 DIAGNOSIS — E785 Hyperlipidemia, unspecified: Secondary | ICD-10-CM

## 2012-12-20 LAB — AST: AST: 20 U/L (ref 0–37)

## 2012-12-20 LAB — LIPID PANEL
Cholesterol: 162 mg/dL (ref 0–200)
HDL: 53.5 mg/dL (ref >39.00)
Triglycerides: 210 mg/dL — ABNORMAL HIGH (ref 0.0–149.0)
VLDL: 42 mg/dL — ABNORMAL HIGH (ref 0.0–40.0)

## 2012-12-20 LAB — ALT: ALT: 11 U/L (ref 0–35)

## 2012-12-20 MED ORDER — LIDOCAINE 5 % EX PTCH: 1.0000 | MEDICATED_PATCH | CUTANEOUS | Status: DC

## 2012-12-20 NOTE — Assessment & Plan Note (Addendum)
H/o sciatic pain on and off, on Neurontin as needed, is doing well and does not require Neurontin frequently

## 2012-12-20 NOTE — Assessment & Plan Note (Signed)
Seems well controlled, continue with same medications, recent BMP normal

## 2012-12-20 NOTE — Patient Instructions (Addendum)
Get your blood work before you leave  Next visit in 6 months  for a routine office visit Please make an appointment before you leave the office today (or call few weeks in advance) ----- Fall Prevention and Home Safety Falls cause injuries and can affect all age groups. It is possible to use preventive measures to significantly decrease the likelihood of falls. There are many simple measures which can make your home safer and prevent falls. OUTDOORS  Repair cracks and edges of walkways and driveways.  Remove high doorway thresholds.  Trim shrubbery on the main path into your home.  Have good outside lighting.  Clear walkways of tools, rocks, debris, and clutter.  Check that handrails are not broken and are securely fastened. Both sides of steps should have handrails.  Have leaves, snow, and ice cleared regularly.  Use sand or salt on walkways during winter months.  In the garage, clean up grease or oil spills. BATHROOM  Install night lights.  Install grab bars by the toilet and in the tub and shower.  Use non-skid mats or decals in the tub or shower.  Place a plastic non-slip stool in the shower to sit on, if needed.  Keep floors dry and clean up all water on the floor immediately.  Remove soap buildup in the tub or shower on a regular basis.  Secure bath mats with non-slip, double-sided rug tape.  Remove throw rugs and tripping hazards from the floors. BEDROOMS  Install night lights.  Make sure a bedside light is easy to reach.  Do not use oversized bedding.  Keep a telephone by your bedside.  Have a firm chair with side arms to use for getting dressed.  Remove throw rugs and tripping hazards from the floor. KITCHEN  Keep handles on pots and pans turned toward the center of the stove. Use back burners when possible.  Clean up spills quickly and allow time for drying.  Avoid walking on wet floors.  Avoid hot utensils and knives.  Position shelves so  they are not too high or low.  Place commonly used objects within easy reach.  If necessary, use a sturdy step stool with a grab bar when reaching.  Keep electrical cables out of the way.  Do not use floor polish or wax that makes floors slippery. If you must use wax, use non-skid floor wax.  Remove throw rugs and tripping hazards from the floor. STAIRWAYS  Never leave objects on stairs.  Place handrails on both sides of stairways and use them. Fix any loose handrails. Make sure handrails on both sides of the stairways are as long as the stairs.  Check carpeting to make sure it is firmly attached along stairs. Make repairs to worn or loose carpet promptly.  Avoid placing throw rugs at the top or bottom of stairways, or properly secure the rug with carpet tape to prevent slippage. Get rid of throw rugs, if possible.  Have an electrician put in a light switch at the top and bottom of the stairs. OTHER FALL PREVENTION TIPS  Wear low-heel or rubber-soled shoes that are supportive and fit well. Wear closed toe shoes.  When using a stepladder, make sure it is fully opened and both spreaders are firmly locked. Do not climb a closed stepladder.  Add color or contrast paint or tape to grab bars and handrails in your home. Place contrasting color strips on first and last steps.  Learn and use mobility aids as needed. Install an electrical   emergency response system.  Turn on lights to avoid dark areas. Replace light bulbs that burn out immediately. Get light switches that glow.  Arrange furniture to create clear pathways. Keep furniture in the same place.  Firmly attach carpet with non-skid or double-sided tape.  Eliminate uneven floor surfaces.  Select a carpet pattern that does not visually hide the edge of steps.  Be aware of all pets. OTHER HOME SAFETY TIPS  Set the water temperature for 120 F (48.8 C).  Keep emergency numbers on or near the telephone.  Keep smoke  detectors on every level of the home and near sleeping areas. Document Released: 03/26/2002 Document Revised: 10/05/2011 Document Reviewed: 06/25/2011 [employer] Patient Information 2014 [employer].      

## 2012-12-20 NOTE — Assessment & Plan Note (Addendum)
Headache is better, on Topamax, to see neurology soon.

## 2012-12-20 NOTE — Assessment & Plan Note (Signed)
Good compliance with meds, labs 

## 2012-12-20 NOTE — Assessment & Plan Note (Addendum)
Td 2004 and today shingles shot 2007 pneumonia shot 2004 and 2009  Colonoscopy w/  Dr Arlyce Dice, + polyp 11-04  colonoscopy 04-2008 @ [facility] : Hemorrhoids, diverticuli.  No polyps  sees gynecology mmg 01-2012 neg DEXA 2004 and 2009 (normal)-------------------will order one today counseled : diet , exercise

## 2012-12-20 NOTE — Assessment & Plan Note (Signed)
Needs a RF on brand only lidoderm patches-- done

## 2012-12-20 NOTE — Progress Notes (Signed)
Subjective:    Patient ID: [name], female    DOB: 1929/08/28, 79 y.o.   MRN: [id number]  HPI Here for Medicare AWV:  1. Risk factors based on Past M, S, F history: reviewed  2. Physical Activities:active at home, no regular exercise  3. Depression/mood: Neg screening  4. Hearing: No problems noted or reported  5. ADL's: Independent, still drives  6. Fall Risk: no recent, counseled  7. home Safety: does feelsafe at home  8. Height, weight, &visual acuity: see VS, has cataract surgery, needs new glasses, will see eye doctor 9. Counseling: provided  10. Labs ordered based on risk factors: if needed  11. Referral Coordination: if needed  12. Care Plan, see assessment and plan  13. Cognitive Assessment: cognition wnl , Motor skills appropriate for age.  In addition, today we discussed the following DJD, on Celebrex which really helps, no apparent side effects, followup by nephrology who monitored her kidney function. History of headaches, good compliance with Topamax, will see neurology soon. High cholesterol, good medication compliance. History of SVTs, has  palpitations on and off and would like to see Dr. Tenny Craw. History of sciatic pain, on Neurontin, she takes that medication as needed, not requiring much at all lately.  Past Medical History  Diagnosis Date  . Hyperlipidemia   . Hypertension   . Anxiety and depression   . Pulmonary nodules     Wert. First seen by CT only 05/04/03, no change on f/u 08/08/06 -Macroscopic changes rml 04/27/07 > resolved april 6,2010 no further w/u   . Knee pain     right chronic  . Back pain     lumbar  . SVT (supraventricular tachycardia)     sees Dr.Ross  . Hydronephrosis     Right 2008 s/p urology eval CT/cysto: observation/monitor  . Headache(784.0)   . GI (gastrointestinal bleed)     hx of colon polyps 2004, diverticulosis, internal hemmorhids   Past Surgical History  Procedure Laterality Date  . Abdominal hysterectomy    .  Rotator cuff repair  9/-2011  . Total knee arthroplasty      right  . Cataract extraction  2008    Dr.Digby  . R ovary removed    . Lumbar reconstructive surgery  01/07/2009  . Appendectomy    . Cholecystectomy    . Tonsillectomy    . Bladder repair    . Shoulder surgery  12-2011    LEFT   Family History  Problem Relation Age of Onset  . Colon cancer Neg Hx   . Breast cancer Mother     later in life  . Lung cancer Father     apparently had TB  . Diabetes Mother     late in life  . Heart disease Mother   . Heart disease Father   . Prostate cancer Father   . Colitis Brother    History   Social History  . Marital Status: Widowed    Spouse Name: N/A    Number of Children: 1  . Years of Education: N/A   Occupational History  . retired     Social History Main Topics  . Smoking status: Never Smoker   . Smokeless tobacco: Never Used  . Alcohol Use: Yes     Comment: rarely  . Drug Use: No  . Sexual Activity: Not on file   Other Topics Concern  . Not on file   Social History Narrative   Moved to a retirement  home 05/2008 [address], has 3 meals a day, has a Pensions consultant    Son lives in [place]    Review of Systems Denies nausea, vomiting, diarrhea, blood in the stools or change in the color of the stools. No chest pain, shortness or breath or lower extremity edema. No dysuria or gross hematuria.     Objective:   Physical Exam BP 116/72  Pulse 60  Temp(Src) 98.5 F (36.9 C)  Wt 146 lb 12.8 oz (66.588 kg)  BMI 24.82 kg/m2  SpO2 98% General -- alert, well-developed, NAD.  Neck --no thyromegaly , normal carotid pulse  HEENT-- Not pale.  Lungs -- normal respiratory effort, no intercostal retractions, no accessory muscle use, and normal breath sounds.  Heart-- normal rate, regular rhythm, no murmur.  Abdomen-- Not distended, good bowel sounds,soft, non-tender. Extremities-- no pretibial edema bilaterally  Neurologic-- alert & oriented X3. Speech,  gait normal, strength normal in all extremities.  Psych-- Cognition and judgment appear intact. Alert and cooperative with normal attention span and concentration. not anxious appearing and not depressed appearing.      Assessment & Plan:

## 2012-12-20 NOTE — Assessment & Plan Note (Signed)
Reports occasional palpitations without red flag symptoms, would like to see Dr. Tenny Craw again, referral will be entered.

## 2012-12-21 ENCOUNTER — Encounter: Payer: Self-pay | Admitting: Internal Medicine

## 2012-12-22 ENCOUNTER — Encounter: Payer: Self-pay | Admitting: *Deleted

## 2012-12-26 ENCOUNTER — Other Ambulatory Visit: Payer: Self-pay | Admitting: Internal Medicine

## 2012-12-26 MED ORDER — DILTIAZEM HCL ER COATED BEADS 120 MG PO CP24: ORAL_CAPSULE | ORAL | Status: DC

## 2012-12-26 NOTE — Telephone Encounter (Signed)
rx refilled per protocol. DJR  

## 2013-01-24 ENCOUNTER — Other Ambulatory Visit: Payer: Self-pay | Admitting: Internal Medicine

## 2013-02-19 ENCOUNTER — Encounter: Payer: Self-pay | Admitting: Internal Medicine

## 2013-02-19 ENCOUNTER — Ambulatory Visit (INDEPENDENT_AMBULATORY_CARE_PROVIDER_SITE_OTHER): Payer: Medicare PPO | Admitting: Internal Medicine

## 2013-02-19 ENCOUNTER — Encounter (INDEPENDENT_AMBULATORY_CARE_PROVIDER_SITE_OTHER): Payer: Self-pay

## 2013-02-19 VITALS — BP 139/79 | HR 61 | Ht 64.0 in | Wt 149.0 lb

## 2013-02-19 DIAGNOSIS — R002 Palpitations: Secondary | ICD-10-CM

## 2013-02-19 DIAGNOSIS — I10 Essential (primary) hypertension: Secondary | ICD-10-CM

## 2013-02-19 DIAGNOSIS — E785 Hyperlipidemia, unspecified: Secondary | ICD-10-CM

## 2013-02-19 NOTE — Patient Instructions (Signed)
Continue same medications    Follow Up as needed

## 2013-02-19 NOTE — Progress Notes (Signed)
HPI Patient is  An 79 yo with a history of HTN, HL and palpitations.  I saw her in 2009  Holter monitor showed rare PVCs   One spell of memory problems about 1 1/2 years ago  Seen by priary MD ? Stroke  Neuroligsist was not convinced Feels HR increasei when she is walking  Also notes racing when lays down Echo in 2013 normal LV function  Trivial regurg. Allergies  Allergen Reactions  . Amitriptyline Hcl     REACTION: hallucinations  . Carisoprodol     REACTION: nightmares,rash,tachycardia  . Codeine     REACTION: "deathly ill" nauesa, dizzy,weak  . Meloxicam     REACTION: sensitive to sun; facial redness; may cause severe abd pain  . Metronidazole     REACTION: abd pain  . Nortriptyline Hcl     REACTION: hallucinations  . Oxaprozin     REACTION: abd pain  . Promethazine Hcl     REACTION: spastic arms and legs flailing while awake and unusual behavior while sleep - walking all over house during night  . Rofecoxib     REACTION: rash  . Sumatriptan     REACTION: rash    Current Outpatient Prescriptions  Medication Sig Dispense Refill  . Acetaminophen (TYLENOL 8 HOUR PO) Take 500 mg by mouth as needed.      . Ascorbic Acid (VITAMIN C PO) Take 1 tablet by mouth 2 (two) times daily.       Marland Kitchen aspirin 81 MG tablet Take 81 mg by mouth daily.        Marland Kitchen atorvastatin (LIPITOR) 40 MG tablet TAKE 1 TABLET BY MOUTH EVERY NIGHT AT BEDTIME  90 tablet  0  . BEPREVE 1.5 % SOLN Place 1 drop into both eyes as needed. Itchy eyes      . Biotin 10 MG TABS Take by mouth. One tab daily      . calcium gluconate 500 MG tablet Take 500 mg by mouth 2 (two) times daily.      . celecoxib (CELEBREX) 200 MG capsule Take 200 mg by mouth daily.      . cetirizine (ZYRTEC) 10 MG tablet Take 10 mg by mouth. As needed      . cyclobenzaprine (FLEXERIL) 5 MG tablet Take as directed per pt      . diltiazem (CARTIA XT) 180 MG 24 hr capsule Take 180 mg by mouth daily.      Marland Kitchen gabapentin (NEURONTIN) 100 MG capsule Take  200-300 mg by mouth 3 (three) times daily as needed.       . lidocaine (LIDODERM) 5 % Place 1 patch onto the skin daily. Remove & Discard patch within 12 hours or as directed by MD  90 patch  2  . METAMUCIL 0.52 G capsule TAKE 1 OR 2 CAPSULES BY MOUTH DAILY  160 capsule  4  . methocarbamol (ROBAXIN) 500 MG tablet Take 500 mg by mouth 3 (three) times daily.      . multivitamin (THERAGRAN) per tablet Take 1 tablet by mouth daily.        . NON FORMULARY 2 capsules 2 (two) times daily. HydroEye (pt. States she gets from Dr. Lucretia Kern EYES      . sodium chloride (OCEAN) 0.65 % nasal spray Place 1 spray into the nose as needed. Dry nose       . topiramate (TOPAMAX) 25 MG capsule Take 75 mg by mouth at bedtime.       No current facility-administered  medications for this visit.    Past Medical History  Diagnosis Date  . Hyperlipidemia   . Hypertension   . Anxiety and depression   . Pulmonary nodules     Wert. First seen by CT only 05/04/03, no change on f/u 08/08/06 -Macroscopic changes rml 04/27/07 > resolved april 6,2010 no further w/u   . Knee pain     right chronic  . Back pain     lumbar  . SVT (supraventricular tachycardia)     sees Dr.Andrena Margerum  . Hydronephrosis     Right 2008 s/p urology eval CT/cysto: observation/monitor  . Headache(784.0)   . GI (gastrointestinal bleed)     hx of colon polyps 2004, diverticulosis, internal hemmorhids    Past Surgical History  Procedure Laterality Date  . Abdominal hysterectomy    . Rotator cuff repair  9/-2011  . Total knee arthroplasty      right  . Cataract extraction  2008    Dr.Digby  . R ovary removed    . Lumbar reconstructive surgery  01/07/2009  . Appendectomy    . Cholecystectomy    . Tonsillectomy    . Bladder repair    . Shoulder surgery  12-2011    LEFT    Family History  Problem Relation Age of Onset  . Colon cancer Neg Hx   . Breast cancer Mother     later in life  . Lung cancer Father     apparently had TB  . Diabetes  Mother     late in life  . Heart disease Mother   . Heart disease Father   . Prostate cancer Father   . Colitis Brother     History   Social History  . Marital Status: Widowed    Spouse Name: N/A    Number of Children: 1  . Years of Education: N/A   Occupational History  . retired     Social History Main Topics  . Smoking status: Never Smoker   . Smokeless tobacco: Never Used  . Alcohol Use: Yes     Comment: rarely  . Drug Use: No  . Sexual Activity: Not on file   Other Topics Concern  . Not on file   Social History Narrative   Moved to a retirement home 05/2008 [place], has 3 meals a day, has a Pensions consultant    Son lives in [place]    Review of Systems:  All systems reviewed.  They are negative to the above problem except as previously stated.  Vital Signs: BP 139/79  Pulse 61  Ht 5\' 4"  (1.626 m)  Wt 149 lb (67.586 kg)  BMI 25.56 kg/m2  Physical Exam Patient is in NAD HEENT:  Normocephalic, atraumatic. EOMI, PERRLA.  Neck: JVP is normal.  No bruits.  Lungs: clear to auscultation. No rales no wheezes.  Heart: Regular rate and rhythm. Normal S1, S2. No S3.   No significant murmurs. PMI not displaced.  Abdomen:  Supple, nontender. Normal bowel sounds. No masses. No hepatomegaly.  Extremities:   Good distal pulses throughout. No lower extremity edema.  Musculoskeletal :moving all extremities.  Neuro:   alert and oriented x3.  CN II-XII grossly intact.  EKG  SR 61  Assessment and Plan:  1.  Heart racing  I am not convinced this represents an arrhythmia like afib  It is too predictable when it occurs  Prob SR/ST. I recomm she check her carotid pulse gently if it happens again.  Continue  ASA  2.  HTn  Adquate control  3.  HL  LDL is very good  Trig a little high  Watch carbs  Echo good a few years ago. Stay active  Stay hydrated  F/u is prn.

## 2013-03-06 ENCOUNTER — Other Ambulatory Visit: Payer: Self-pay | Admitting: Internal Medicine

## 2013-03-06 NOTE — Telephone Encounter (Signed)
Atorvastatin refilled per protocol 

## 2013-05-09 ENCOUNTER — Telehealth: Payer: Self-pay | Admitting: *Deleted

## 2013-05-09 DIAGNOSIS — N959 Unspecified menopausal and perimenopausal disorder: Secondary | ICD-10-CM

## 2013-05-09 NOTE — Telephone Encounter (Signed)
Patient called and stated that she would like to have her bone density test done at the [facility] of [place] but they told her she needed a referral.

## 2013-05-09 NOTE — Telephone Encounter (Signed)
Done

## 2013-05-09 NOTE — Telephone Encounter (Signed)
Any referral or order must be entered by the provider. Forwarded to Dr. Larose Kells.

## 2013-05-09 NOTE — Addendum Note (Signed)
Addended by: Kathlene November E on: 05/09/2013 05:16 PM   Modules accepted: Orders

## 2013-05-24 ENCOUNTER — Other Ambulatory Visit: Payer: Self-pay

## 2013-05-24 DIAGNOSIS — Z1231 Encounter for screening mammogram for malignant neoplasm of breast: Secondary | ICD-10-CM

## 2013-06-19 ENCOUNTER — Ambulatory Visit (INDEPENDENT_AMBULATORY_CARE_PROVIDER_SITE_OTHER): Payer: Medicare PPO | Admitting: Internal Medicine

## 2013-06-19 ENCOUNTER — Encounter: Payer: Self-pay | Admitting: Internal Medicine

## 2013-06-19 VITALS — BP 106/60 | HR 69 | Temp 98.0°F | Wt 147.0 lb

## 2013-06-19 DIAGNOSIS — I10 Essential (primary) hypertension: Secondary | ICD-10-CM

## 2013-06-19 DIAGNOSIS — Z0000 Encounter for general adult medical examination without abnormal findings: Secondary | ICD-10-CM

## 2013-06-19 LAB — BASIC METABOLIC PANEL
BUN: 22 mg/dL (ref 6–23)
CO2: 22 mEq/L (ref 19–32)
Calcium: 9.6 mg/dL (ref 8.4–10.5)
Chloride: 108 mEq/L (ref 96–112)
Creatinine, Ser: 1 mg/dL (ref 0.4–1.2)
GFR: 57.16 mL/min — AB (ref >60.00)
Glucose, Bld: 87 mg/dL (ref 70–99)
POTASSIUM: 3.8 meq/L (ref 3.5–5.1)
Sodium: 137 mEq/L (ref 135–145)

## 2013-06-19 NOTE — Patient Instructions (Signed)
Get your blood work before you leave   Next visit is for a physical exam in 6 months , fasting Please make an appointment    

## 2013-06-19 NOTE — Progress Notes (Signed)
Pre visit review using our clinic review tool, if applicable. No additional management support is needed unless otherwise documented below in the visit note. 

## 2013-06-19 NOTE — Assessment & Plan Note (Addendum)
Well-controlled, on daily Celebrex, check a BMP to monitor her renal fx

## 2013-06-19 NOTE — Progress Notes (Signed)
Subjective:    Patient ID: [name], female    DOB: 1929/05/18, 80 y.o.   MRN: [id number]  DOS:  06/19/2013 Type of  visit: ROV In general feeling well. Medication list reviewed, she's taking Detrol as prescribed by gynecology and doing great. Recently had trigger finger surgery. Taking BP medication with good control , nl amb BPs. Taking Celebrex daily without apparent GI side effects. Saw cardiology, felt to be stable, followup when necessary. Has been unable to get a bone density test at by her insurance    ROS  No  CP, SOB No palpitations, no lower extremity edema Denies  nausea, vomiting diarrhea Denies  blood in the stools  No anxiety, depression (Moved from a retirement community to her own condominium and feels great)        Past Medical History  Diagnosis Date  . Hyperlipidemia   . Hypertension   . Anxiety and depression   . Pulmonary nodules     Wert. First seen by CT only 05/04/03, no change on f/u 08/08/06 -Macroscopic changes rml 04/27/07 > resolved april 6,2010 no further w/u   . Knee pain     right chronic  . Back pain     lumbar  . SVT (supraventricular tachycardia)     sees Dr.Ross  . Hydronephrosis     Right 2008 s/p urology eval CT/cysto: observation/monitor  . Headache(784.0)   . GI (gastrointestinal bleed)     hx of colon polyps 2004, diverticulosis, internal hemmorhids    Past Surgical History  Procedure Laterality Date  . Abdominal hysterectomy    . Rotator cuff repair  9/-2011  . Total knee arthroplasty      right  . Cataract extraction  2008    Dr.Digby  . R ovary removed    . Lumbar reconstructive surgery  01/07/2009  . Appendectomy    . Cholecystectomy    . Tonsillectomy    . Bladder repair    . Shoulder surgery  12-2011    LEFT  . Trigger finger surgery      L thumb (2014), R ring finger 05-2013    History   Social History  . Marital Status: Widowed    Spouse Name: N/A    Number of Children: 1  . Years of  Education: N/A   Occupational History  . retired     Social History Main Topics  . Smoking status: Never Smoker   . Smokeless tobacco: Never Used  . Alcohol Use: Yes     Comment: rarely  . Drug Use: No  . Sexual Activity: Not on file   Other Topics Concern  . Not on file   Social History Narrative   Moved from a retirement home to a condominium 04-2013, lives by herself    Son lives in [place]        Medication List       This list is accurate as of: 06/19/13  1:57 PM.  Always use your most recent med list.               aspirin 81 MG tablet  Take 81 mg by mouth daily.     atorvastatin 40 MG tablet  Commonly known as:  LIPITOR  TAKE 1 TABLET BY MOUTH ONCE EVERY NIGHT AT BEDTIME     BEPREVE 1.5 % Soln  Generic drug:  Bepotastine Besilate  Place 1 drop into both eyes as needed. Itchy eyes     Biotin 10 MG  Tabs  Take by mouth. One tab daily     calcium gluconate 500 MG tablet  Take 500 mg by mouth 2 (two) times daily.     CARTIA XT 180 MG 24 hr capsule  Generic drug:  diltiazem  Take 180 mg by mouth daily.     celecoxib 200 MG capsule  Commonly known as:  CELEBREX  Take 200 mg by mouth daily.     cetirizine 10 MG tablet  Commonly known as:  ZYRTEC  Take 10 mg by mouth. As needed     cyclobenzaprine 5 MG tablet  Commonly known as:  FLEXERIL  Take by mouth. Take as directed per Dr Domingo Cocking, uses it rarely for HAs     DETROL PO  Take by mouth.     gabapentin 100 MG capsule  Commonly known as:  NEURONTIN  Take 200-300 mg by mouth 3 (three) times daily as needed.     HYDROcodone-acetaminophen 5-325 MG per tablet  Commonly known as:  NORCO/VICODIN     lidocaine 5 %  Commonly known as:  LIDODERM  Place 1 patch onto the skin daily. Remove & Discard patch within 12 hours or as directed by MD     METAMUCIL 0.52 G capsule  Generic drug:  psyllium  TAKE 1 OR 2 CAPSULES BY MOUTH DAILY     methocarbamol 500 MG tablet  Commonly known as:  ROBAXIN  Take  500 mg by mouth 3 (three) times daily.     multivitamin per tablet  Take 1 tablet by mouth daily.     NON FORMULARY  2 capsules 2 (two) times daily. HydroEye (pt. States she gets from Dr. Doy Mince EYES     sodium chloride 0.65 % nasal spray  Commonly known as:  OCEAN  Place 1 spray into the nose as needed. Dry nose     topiramate 25 MG capsule  Commonly known as:  TOPAMAX  Take 75 mg by mouth at bedtime.     TYLENOL 8 HOUR PO  Take 500 mg by mouth as needed.     VITAMIN C PO  Take 1 tablet by mouth 2 (two) times daily.           Objective:   Physical Exam BP 106/60  Pulse 69  Temp(Src) 98 F (36.7 C)  Wt 147 lb (66.679 kg)  SpO2 94%  General -- alert, well-developed, NAD.  Lungs -- normal respiratory effort, no intercostal retractions, no accessory muscle use, and normal breath sounds.  Heart-- normal rate, regular rhythm, no murmur.   Extremities-- no pretibial edema bilaterally  Neurologic--  alert & oriented X3. Speech normal, gait normal, strength normal in all extremities.  Psych-- Cognition and judgment appear intact. Cooperative with normal attention span and concentration. No anxious or depressed appearing.       Assessment & Plan:

## 2013-06-19 NOTE — Assessment & Plan Note (Signed)
Since the last time she was here, she saw gynecology. Was prescribed Detrol with good results. Was unable to get a bone density test due to insurance constraints, we agreed to continue taking calcium and vitamin D.

## 2013-06-20 ENCOUNTER — Telehealth: Payer: Self-pay | Admitting: Internal Medicine

## 2013-06-20 NOTE — Telephone Encounter (Signed)
Relevant patient education mailed to patient.  

## 2013-06-21 ENCOUNTER — Encounter: Payer: Self-pay | Admitting: *Deleted

## 2013-07-13 ENCOUNTER — Ambulatory Visit (INDEPENDENT_AMBULATORY_CARE_PROVIDER_SITE_OTHER): Payer: Medicare PPO | Admitting: Physician Assistant

## 2013-07-13 ENCOUNTER — Ambulatory Visit: Payer: Medicare PPO

## 2013-07-13 ENCOUNTER — Encounter: Payer: Self-pay | Admitting: Physician Assistant

## 2013-07-13 VITALS — BP 112/67 | HR 81 | Temp 98.6°F | Resp 16 | Ht 64.0 in | Wt 148.2 lb

## 2013-07-13 DIAGNOSIS — J208 Acute bronchitis due to other specified organisms: Secondary | ICD-10-CM | POA: Insufficient documentation

## 2013-07-13 DIAGNOSIS — J209 Acute bronchitis, unspecified: Secondary | ICD-10-CM

## 2013-07-13 MED ORDER — BENZONATATE 100 MG PO CAPS: 100.0000 mg | ORAL_CAPSULE | Freq: Two times a day (BID) | ORAL | Status: DC | PRN

## 2013-07-13 NOTE — Progress Notes (Signed)
Patient presents to clinic today c/o sinus pressure, cough, and post-nasal drip x 2 days.  Denies fever, chills, pleuritic chest pain.  Endorses mild dry cough. Patient denies recent travel or sick contact.  Is currently on Keflex by her Surgeon for cellulitis that is resolving.  Has one week left of antibiotic.  Past Medical History  Diagnosis Date  . Hyperlipidemia   . Hypertension   . Anxiety and depression   . Pulmonary nodules     Wert. First seen by CT only 05/04/03, no change on f/u 08/08/06 -Macroscopic changes rml 04/27/07 > resolved april 6,2010 no further w/u   . Knee pain     right chronic  . Back pain     lumbar  . SVT (supraventricular tachycardia)     sees Dr.Ross  . Hydronephrosis     Right 2008 s/p urology eval CT/cysto: observation/monitor  . Headache(784.0)   . GI (gastrointestinal bleed)     hx of colon polyps 2004, diverticulosis, internal hemmorhids    Current Outpatient Prescriptions on File Prior to Visit  Medication Sig Dispense Refill  . Acetaminophen (TYLENOL 8 HOUR PO) Take 500 mg by mouth as needed.      . Ascorbic Acid (VITAMIN C PO) Take 1 tablet by mouth 2 (two) times daily.       Marland Kitchen aspirin 81 MG tablet Take 81 mg by mouth daily.        Marland Kitchen atorvastatin (LIPITOR) 40 MG tablet TAKE 1 TABLET BY MOUTH ONCE EVERY NIGHT AT BEDTIME  90 tablet  1  . BEPREVE 1.5 % SOLN Place 1 drop into both eyes as needed. Itchy eyes      . Biotin 10 MG TABS Take by mouth. One tab daily      . calcium gluconate 500 MG tablet Take 500 mg by mouth 2 (two) times daily.      . celecoxib (CELEBREX) 200 MG capsule Take 200 mg by mouth daily.      . cetirizine (ZYRTEC) 10 MG tablet Take 10 mg by mouth. As needed      . cyclobenzaprine (FLEXERIL) 5 MG tablet Take by mouth. Take as directed per Dr Domingo Cocking, uses it rarely for HAs      . diltiazem (CARTIA XT) 180 MG 24 hr capsule Take 180 mg by mouth daily.      Marland Kitchen gabapentin (NEURONTIN) 100 MG capsule Take 200-300 mg by mouth 3 (three)  times daily as needed.       Marland Kitchen HYDROcodone-acetaminophen (NORCO/VICODIN) 5-325 MG per tablet       . lidocaine (LIDODERM) 5 % Place 1 patch onto the skin daily. Remove & Discard patch within 12 hours or as directed by MD  90 patch  2  . METAMUCIL 0.52 G capsule TAKE 1 OR 2 CAPSULES BY MOUTH DAILY  160 capsule  4  . methocarbamol (ROBAXIN) 500 MG tablet Take 500 mg by mouth 3 (three) times daily.      . multivitamin (THERAGRAN) per tablet Take 1 tablet by mouth daily.        . NON FORMULARY 2 capsules 2 (two) times daily. HydroEye (pt. States she gets from Dr. Doy Mince EYES      . sodium chloride (OCEAN) 0.65 % nasal spray Place 1 spray into the nose as needed. Dry nose       . Tolterodine Tartrate (DETROL PO) Take by mouth.      . topiramate (TOPAMAX) 25 MG capsule Take 75 mg by mouth at  bedtime.       No current facility-administered medications on file prior to visit.    Allergies  Allergen Reactions  . Amitriptyline Hcl     REACTION: hallucinations  . Carisoprodol     REACTION: nightmares,rash,tachycardia  . Codeine     REACTION: "deathly ill" nauesa, dizzy,weak  . Meloxicam     REACTION: sensitive to sun; facial redness; may cause severe abd pain  . Metronidazole     REACTION: abd pain  . Nortriptyline Hcl     REACTION: hallucinations  . Oxaprozin     REACTION: abd pain  . Promethazine Hcl     REACTION: spastic arms and legs flailing while awake and unusual behavior while sleep - walking all over house during night  . Rofecoxib     REACTION: rash  . Sumatriptan     REACTION: rash    Family History  Problem Relation Age of Onset  . Colon cancer Neg Hx   . Breast cancer Mother     later in life  . Lung cancer Father     apparently had TB  . Diabetes Mother     late in life  . Heart disease Mother   . Heart disease Father   . Prostate cancer Father   . Colitis Brother     History   Social History  . Marital Status: Widowed    Spouse Name: N/A    Number of  Children: 1  . Years of Education: N/A   Occupational History  . retired     Social History Main Topics  . Smoking status: Never Smoker   . Smokeless tobacco: Never Used  . Alcohol Use: Yes     Comment: rarely  . Drug Use: No  . Sexual Activity: None   Other Topics Concern  . None   Social History Narrative   Moved from a retirement home to a condominium 04-2013, lives by herself    Son lives in [place] - See HPI.  All other ROS are negative.  BP 112/67  Pulse 81  Temp(Src) 98.6 F (37 C) (Oral)  Resp 16  Ht 5\' 4"  (1.626 m)  Wt 148 lb 4 oz (67.246 kg)  BMI 25.43 kg/m2  SpO2 96%  Physical Exam  Vitals reviewed. Constitutional: She is oriented to person, place, and time and well-developed, well-nourished, and in no distress.  HENT:  Head: Normocephalic and atraumatic.  Right Ear: External ear normal.  Left Ear: External ear normal.  Nose: Nose normal.  Mouth/Throat: Oropharynx is clear and moist. No oropharyngeal exudate.  TM within normal limits bilaterally.  Eyes: Conjunctivae are normal. Pupils are equal, round, and reactive to light.  Neck: Neck supple.  Cardiovascular: Normal rate, regular rhythm, normal heart sounds and intact distal pulses.   Pulmonary/Chest: Effort normal and breath sounds normal. No respiratory distress. She has no wheezes. She has no rales. She exhibits no tenderness.  Lymphadenopathy:    She has no cervical adenopathy.  Neurological: She is alert and oriented to person, place, and time.  Skin: Skin is warm and dry. No rash noted.  Psychiatric: Affect normal.   Recent Results (from the past 2160 hour(s))  BASIC METABOLIC PANEL     Status: Abnormal   Collection Time    06/19/13  9:58 AM      Result Value Ref Range   Sodium 137  135 - 145 mEq/L   Potassium 3.8  3.5 - 5.1 mEq/L  Chloride 108  96 - 112 mEq/L   CO2 22  19 - 32 mEq/L   Glucose, Bld 87  70 - 99 mg/dL   BUN 22  6 - 23 mg/dL   Creatinine, Ser 1.0   0.4 - 1.2 mg/dL   Calcium 9.6  8.4 - 10.5 mg/dL   GFR 57.16 (*) >60.00 mL/min   Assessment/Plan: Viral bronchitis Seems viral etiology.  Increase fluids.  Rest. Saline nasal spray. Probiotic.  Rx [employer]  Humidifier in the bedroom.  Continue Keflex as prescribed by Surgeon.

## 2013-07-13 NOTE — Progress Notes (Signed)
Pre visit review using our clinic review tool, if applicable. No additional management support is needed unless otherwise documented below in the visit note/SLS  

## 2013-07-13 NOTE — Patient Instructions (Signed)
Increase fluid intake.  Rest.  Saline nasal spray.  Tessalon Perles for cough. Continue Plain Mucinex.  Humidifier in the bedroom. Continue Keflex given to you by surgeon for skin infection. Please call or return to clinic if symptoms do not begin to improve.  Acute Bronchitis Bronchitis is when the airways that extend from the windpipe into the lungs get red, puffy, and painful (inflamed). Bronchitis often causes thick spit (mucus) to develop. This leads to a cough. A cough is the most common symptom of bronchitis. In acute bronchitis, the condition usually begins suddenly and goes away over time (usually in 2 weeks). Smoking, allergies, and asthma can make bronchitis worse. Repeated episodes of bronchitis may cause more lung problems. HOME CARE  Rest.  Drink enough fluids to keep your pee (urine) clear or pale yellow (unless you need to limit fluids as told by your doctor).  Only take over-the-counter or prescription medicines as told by your doctor.  Avoid smoking and secondhand smoke. These can make bronchitis worse. If you are a smoker, think about using nicotine gum or skin patches. Quitting smoking will help your lungs heal faster.  Reduce the chance of getting bronchitis again by:  Washing your hands often.  Avoiding people with cold symptoms.  Trying not to touch your hands to your mouth, nose, or eyes.  Follow up with your doctor as told. GET HELP IF: Your symptoms do not improve after 1 week of treatment. Symptoms include:  Cough.  Fever.  Coughing up thick spit.  Body aches.  Chest congestion.  Chills.  Shortness of breath.  Sore throat. GET HELP RIGHT AWAY IF:   You have an increased fever.  You have chills.  You have severe shortness of breath.  You have bloody thick spit (sputum).  You throw up (vomit) often.  You lose too much body fluid (dehydration).  You have a severe headache.  You faint. MAKE SURE YOU:   Understand these  instructions.  Will watch your condition.  Will get help right away if you are not doing well or get worse. Document Released: 09/22/2007 Document Revised: 12/06/2012 Document Reviewed: 09/26/2012 [facility] Patient Information 2014 [place].

## 2013-07-13 NOTE — Assessment & Plan Note (Signed)
Seems viral etiology.  Increase fluids.  Rest. Saline nasal spray. Probiotic.  Rx [employer]  Humidifier in the bedroom.  Continue Keflex as prescribed by Surgeon.

## 2013-07-16 ENCOUNTER — Telehealth: Payer: Self-pay | Admitting: Internal Medicine

## 2013-07-16 NOTE — Telephone Encounter (Signed)
Patient called and stated that she is still not feeling all the way better. Patient states that she has mucus in her chest and when she coughs nothings comes up.Patient wanted to see if there is something else that can be called in for her. Please advise.

## 2013-07-16 NOTE — Telephone Encounter (Signed)
Patient informed to continue Keflex ABX, Benzonatate, Mucinex [plain-No D and/or DM, Saline spray & increased fluids, as the cough will be the last thing that subsides but to call back if new and/or worsening symptoms [i.e. Fever, SOB]; pt understood & agreed/SLS

## 2013-07-23 ENCOUNTER — Ambulatory Visit (INDEPENDENT_AMBULATORY_CARE_PROVIDER_SITE_OTHER): Payer: Medicare PPO | Admitting: Internal Medicine

## 2013-07-23 ENCOUNTER — Encounter: Payer: Self-pay | Admitting: Internal Medicine

## 2013-07-23 VITALS — BP 137/81 | HR 69 | Temp 98.6°F | Wt 146.0 lb

## 2013-07-23 DIAGNOSIS — J209 Acute bronchitis, unspecified: Secondary | ICD-10-CM

## 2013-07-23 DIAGNOSIS — J208 Acute bronchitis due to other specified organisms: Secondary | ICD-10-CM

## 2013-07-23 MED ORDER — AZITHROMYCIN 250 MG PO TABS: ORAL_TABLET | ORAL | Status: DC

## 2013-07-23 MED ORDER — BENZONATATE 100 MG PO CAPS: 100.0000 mg | ORAL_CAPSULE | Freq: Two times a day (BID) | ORAL | Status: DC | PRN

## 2013-07-23 MED ORDER — LIDOCAINE 5 % EX PTCH: MEDICATED_PATCH | CUTANEOUS | Status: DC

## 2013-07-23 NOTE — Patient Instructions (Signed)
Rest, fluids , tylenol  For cough, take Mucinex DM twice a day as needed ; you can also take tessalon perles  For congestion use OTC Nasocort: 2 nasal sprays on each side of the nose daily until you feel better   Take the antibiotic as prescribed  (zithromax) Call if no better in few days Call anytime if the symptoms are severe  Next visit by 12-2013 (physical exam)

## 2013-07-23 NOTE — Progress Notes (Signed)
Subjective:    Patient ID: [name], female    DOB: [birth year], 80 y.o.   MRN: [id number]  DOS:  07/23/2013 Type of  visit: Acute visit  Symptoms started 07/11/2013, 2 days later she was seen here with chest congestion and cough. She was recommended to continue taking Keflex prescribed elsewhere for cellulitis. Currently taking also Mucinex and Tessalon Perles which seemed to help.  She has developed some anterior chest pain with cough only.   ROS Denies fever chills. No sinus pain but has some clear nasal discharge. Mild nausea, no vomiting or diarrhea  Past Medical History  Diagnosis Date  . Hyperlipidemia   . Hypertension   . Anxiety and depression   . Pulmonary nodules     Wert. First seen by CT only 05/04/03, no change on f/u 08/08/06 -Macroscopic changes rml 04/27/07 > resolved april 6,2010 no further w/u   . Knee pain     right chronic  . Back pain     lumbar  . SVT (supraventricular tachycardia)     sees Dr.Ross  . Hydronephrosis     Right 2008 s/p urology eval CT/cysto: observation/monitor  . Headache(784.0)   . GI (gastrointestinal bleed)     hx of colon polyps 2004, diverticulosis, internal hemmorhids    Past Surgical History  Procedure Laterality Date  . Abdominal hysterectomy    . Rotator cuff repair  9/-2011  . Total knee arthroplasty      right  . Cataract extraction  2008    Dr.Digby  . R ovary removed    . Lumbar reconstructive surgery  01/07/2009  . Appendectomy    . Cholecystectomy    . Tonsillectomy    . Bladder repair    . Shoulder surgery  12-2011    LEFT  . Trigger finger surgery      L thumb (2014), R ring finger 05-2013    History   Social History  . Marital Status: Widowed    Spouse Name: N/A    Number of Children: 1  . Years of Education: N/A   Occupational History  . retired     Social History Main Topics  . Smoking status: Never Smoker   . Smokeless tobacco: Never Used  . Alcohol Use: Yes     Comment: rarely  .  Drug Use: No  . Sexual Activity: Not on file   Other Topics Concern  . Not on file   Social History Narrative   Moved from a retirement home to a condominium 04-2013, lives by herself    Son lives in [place]        Medication List       This list is accurate as of: 07/23/13 11:59 PM.  Always use your most recent med list.               aspirin 81 MG tablet  Take 81 mg by mouth daily.     atorvastatin 40 MG tablet  Commonly known as:  LIPITOR  TAKE 1 TABLET BY MOUTH ONCE EVERY NIGHT AT BEDTIME     azithromycin 250 MG tablet  Commonly known as:  ZITHROMAX Z-PAK  As directed     benzonatate 100 MG capsule  Commonly known as:  TESSALON  Take 1 capsule (100 mg total) by mouth 2 (two) times daily as needed for cough.     BEPREVE 1.5 % Soln  Generic drug:  Bepotastine Besilate  Place 1 drop into both eyes as needed.  Itchy eyes     Biotin 10 MG Tabs  Take by mouth. One tab daily     calcium gluconate 500 MG tablet  Take 500 mg by mouth 2 (two) times daily.     CARTIA XT 180 MG 24 hr capsule  Generic drug:  diltiazem  Take 180 mg by mouth daily.     celecoxib 200 MG capsule  Commonly known as:  CELEBREX  Take 200 mg by mouth daily.     cetirizine 10 MG tablet  Commonly known as:  ZYRTEC  Take 10 mg by mouth. As needed     cyclobenzaprine 5 MG tablet  Commonly known as:  FLEXERIL  Take by mouth. Take as directed per Dr Domingo Cocking, uses it rarely for HAs     DETROL PO  Take by mouth.     gabapentin 100 MG capsule  Commonly known as:  NEURONTIN  Take 200-300 mg by mouth 3 (three) times daily as needed.     lidocaine 5 %  Commonly known as:  LIDODERM  APPLY ONE PATCH AS DIRECTED FOR KNEE PAIN, NO MORE THEN 3 DAILY FOR 12 HOURS     methocarbamol 500 MG tablet  Commonly known as:  ROBAXIN  Take 500 mg by mouth 3 (three) times daily.     multivitamin per tablet  Take 1 tablet by mouth daily.     NON FORMULARY  2 capsules 2 (two) times daily. HydroEye (pt.  States she gets from Dr. Doy Mince EYES     sodium chloride 0.65 % nasal spray  Commonly known as:  OCEAN  Place 1 spray into the nose as needed. Dry nose     topiramate 25 MG capsule  Commonly known as:  TOPAMAX  Take 75 mg by mouth at bedtime.     TYLENOL 8 HOUR PO  Take 500 mg by mouth as needed.     VITAMIN C PO  Take 1 tablet by mouth 2 (two) times daily.           Objective:   Physical Exam BP 137/81  Pulse 69  Temp(Src) 98.6 F (37 C)  Wt 146 lb (66.225 kg)  SpO2 98%  General -- alert, well-developed, NAD.   HEENT-- Not pale. TMs normal, throat symmetric, no redness or discharge. Face symmetric, sinuses not tender to palpation. Nose slt congested. Lungs -- normal respiratory effort, no intercostal retractions, no accessory muscle use, and + Large airway congestion with cough, few rhonchi. No crackles or wheezing Heart-- normal rate, regular rhythm, no murmur.   Psych-- Cognition and judgment appear intact. Cooperative with normal attention span and concentration. No anxious or depressed appearing.       Assessment & Plan:   Bronchitis, See instructions

## 2013-07-25 ENCOUNTER — Other Ambulatory Visit: Payer: Self-pay | Admitting: Internal Medicine

## 2013-07-30 ENCOUNTER — Telehealth: Payer: Self-pay | Admitting: *Deleted

## 2013-07-30 NOTE — Telephone Encounter (Signed)
Prior authorization paperwork for Lidocaine 5% patch faxed. Awaiting response. JG//CMA

## 2013-07-30 NOTE — Telephone Encounter (Signed)
4.13.15  [facility] [address] [place] [place]  4.13.15  Pt also called to check status of Prior Auth paperwork for Lidocaine patches.  Informed pt of status.

## 2013-08-02 ENCOUNTER — Ambulatory Visit (INDEPENDENT_AMBULATORY_CARE_PROVIDER_SITE_OTHER): Payer: Medicare PPO | Admitting: Nurse Practitioner

## 2013-08-02 ENCOUNTER — Encounter: Payer: Self-pay | Admitting: Nurse Practitioner

## 2013-08-02 ENCOUNTER — Telehealth: Payer: Self-pay | Admitting: Internal Medicine

## 2013-08-02 VITALS — BP 116/68 | HR 73 | Temp 98.4°F | Ht 64.0 in | Wt 149.0 lb

## 2013-08-02 DIAGNOSIS — J989 Respiratory disorder, unspecified: Secondary | ICD-10-CM

## 2013-08-02 MED ORDER — DOXYCYCLINE HYCLATE 100 MG PO TABS: 100.0000 mg | ORAL_TABLET | Freq: Two times a day (BID) | ORAL | Status: DC

## 2013-08-02 NOTE — Telephone Encounter (Signed)
Prior authorization for Lidocaine 5% patch denied. JG//CMA

## 2013-08-02 NOTE — Telephone Encounter (Signed)
Spoke with patient who states that she finished her Z-pak last Thursday. Is still experiencing cough and chest tightness. States no wheezing. Advised to come in. Patient scheduled for this afternoon.

## 2013-08-02 NOTE — Patient Instructions (Signed)
Sometimes it takes several weeks to regain energy & feel better after a viral illness.  Given your persistent fatigue & productive cough, we wil try 1 more antibiotic that will cover pneumonia.  Please follow up with dr Larose Kells. He may want to order chest xray if you are not better. Sip fluids, Rest. Take mucinex to help break up congestion. Perform several deep breathing exercises daily to help inflate lungs & clear secretions. Feel better.

## 2013-08-02 NOTE — Progress Notes (Signed)
Subjective:     [name] is a 80 y.o. female who presents for follow up of cough & chest pain. Symptoms began 1 month ago. Symptoms have been waxing & waning.  She finished azithromycin 6 days ago. She took keflex several week ago for cellulitis on hand. Additionally, she is using mucinex & tesslon pearles. She has trouble describing what symptoms are worse & which ones are better. She is still coughing, c/o sore chest, and extreme fatigue.  The following portions of the patient's history were reviewed and updated as appropriate: allergies, current medications, past medical history, past social history, past surgical history and problem list.  Review of Systems Pertinent items are noted in HPI.    Objective:    BP 116/68  Pulse 73  Temp(Src) 98.4 F (36.9 C) (Oral)  Ht 5\' 4"  (1.626 m)  Wt 149 lb (67.586 kg)  BMI 25.56 kg/m2  SpO2 97% General appearance: alert, cooperative, appears stated age and fatigued Head: Normocephalic, without obvious abnormality, atraumatic Eyes: negative findings: lids and lashes normal and conjunctivae and sclerae normal Ears: R TM nml, yellow fluid L TM Throat: lips, mucosa, and tongue normal; teeth and gums normal Lungs: clear to auscultation bilaterally and cough sounds congested Heart: regular rate and rhythm, S1, S2 normal, no murmur, click, rub or gallop Lymph nodes: Cervical, supraclavicular, and axillary nodes normal.    Assessment:    respiratory illness  DD: pneumonia, bronchitis, post-viral pneumonitis   Plan:   Doxy X 7 d mucinex Fluids & rest F/u 2 weeks. Follow up as needed for persistent, worsening cough, or appearance of new symptoms.

## 2013-08-02 NOTE — Telephone Encounter (Signed)
4.16.15  Pt is calling in stating that she is not feeling any better after office visit on 4.13.  Please advise.

## 2013-08-02 NOTE — Progress Notes (Signed)
Pre visit review using our clinic review tool, if applicable. No additional management support is needed unless otherwise documented below in the visit note. 

## 2013-08-03 NOTE — Telephone Encounter (Signed)
Let pt know if not done already

## 2013-08-03 NOTE — Telephone Encounter (Signed)
Called and left message for patient to please return call. JG//CMA 

## 2013-08-10 ENCOUNTER — Other Ambulatory Visit: Payer: Self-pay | Admitting: Physician Assistant

## 2013-08-10 NOTE — Telephone Encounter (Signed)
Rx request Denied, pt needs appointment w/PCP/SLS

## 2013-08-13 ENCOUNTER — Telehealth: Payer: Self-pay | Admitting: Internal Medicine

## 2013-08-13 DIAGNOSIS — R059 Cough, unspecified: Secondary | ICD-10-CM

## 2013-08-13 DIAGNOSIS — R05 Cough: Secondary | ICD-10-CM

## 2013-08-13 NOTE — Telephone Encounter (Signed)
Caller name:Marshay G Fath Relation to MM:[id number] Call back Luther:  Reason for call: Patient called stating that she still has chest congestion and possible may need an x-ray. Patient was seen on 08/02/2013 and 07/24/2014 for coughing and chest congestion. Please advise.

## 2013-08-13 NOTE — Telephone Encounter (Signed)
Please enter a order chest x-ray -- dx cough If she's feeling worse, has fever, chills or difficulty breathing-- needs to be seen If  she is not back to normal by next week let us know

## 2013-08-14 NOTE — Telephone Encounter (Signed)
Xray ordered. Pt notified verbalized understanding

## 2013-08-15 ENCOUNTER — Ambulatory Visit
Admission: RE | Admit: 2013-08-15 | Discharge: 2013-08-15 | Disposition: A | Discharge status: Home / Self Care | Payer: Medicare PPO | Source: Ambulatory Visit | Attending: Internal Medicine | Admitting: Internal Medicine

## 2013-08-15 DIAGNOSIS — R059 Cough, unspecified: Secondary | ICD-10-CM

## 2013-08-15 DIAGNOSIS — R05 Cough: Secondary | ICD-10-CM

## 2013-08-17 ENCOUNTER — Encounter: Payer: Self-pay | Admitting: Internal Medicine

## 2013-08-17 ENCOUNTER — Ambulatory Visit (INDEPENDENT_AMBULATORY_CARE_PROVIDER_SITE_OTHER): Payer: Medicare PPO | Admitting: Internal Medicine

## 2013-08-17 VITALS — BP 102/57 | HR 78 | Temp 98.0°F | Wt 147.0 lb

## 2013-08-17 DIAGNOSIS — J209 Acute bronchitis, unspecified: Secondary | ICD-10-CM

## 2013-08-17 DIAGNOSIS — J208 Acute bronchitis due to other specified organisms: Secondary | ICD-10-CM

## 2013-08-17 MED ORDER — PREDNISONE 10 MG PO TABS: 20.0000 mg | ORAL_TABLET | Freq: Every day | ORAL | Status: DC

## 2013-08-17 MED ORDER — AZELASTINE HCL 0.1 % NA SOLN: 2.0000 | Freq: Two times a day (BID) | NASAL | Status: DC

## 2013-08-17 NOTE — Progress Notes (Signed)
Pre visit review using our clinic review tool, if applicable. No additional management support is needed unless otherwise documented below in the visit note. 

## 2013-08-17 NOTE — Progress Notes (Signed)
Subjective:    Patient ID: [name], female    DOB: 10/01/1929, 80 y.o.   MRN: [id number]  DOS:  08/17/2013 Type of  visit:  Followup Symptoms started ~ 07/11/2013, has been seen 3 times since then, here for followup. Status post a Z-Pak, doxycycline. CXR 08-14-13 (-)  ROS Since she was here, cont w/ on and off right ear ache, feels tired, occasionally coughs and has chest congestion. + Postnasal dripping. Denies fever, chills. Nasal discharge is clear, no sputum production. BPs are slightly low today, has good appetite and good intake of fluids and food. Feels slightly dizzy. No  nausea, vomiting. Had diarrhea one time yesterday but no blood in the stools.  Past Medical History  Diagnosis Date  . Hyperlipidemia   . Hypertension   . Anxiety and depression   . Pulmonary nodules     [place]ert. First seen by CT only 05/04/03, no change on f/u 08/08/06 -Macroscopic changes rml 04/27/07 > resolved april 6,2010 no further w/u   . Knee pain     right chronic  . Back pain     lumbar  . SVT (supraventricular tachycardia)     sees Dr.Ross  . Hydronephrosis     Right 2008 s/p urology eval CT/cysto: observation/monitor  . Headache(784.0)   . GI (gastrointestinal bleed)     hx of colon polyps 2004, diverticulosis, internal hemmorhids    Past Surgical History  Procedure Laterality Date  . Abdominal hysterectomy    . Rotator cuff repair  9/-2011  . Total knee arthroplasty      right  . Cataract extraction  2008    Dr.Digby  . R ovary removed    . Lumbar reconstructive surgery  01/07/2009  . Appendectomy    . Cholecystectomy    . Tonsillectomy    . Bladder repair    . Shoulder surgery  12-2011    LEFT  . Trigger finger surgery      L thumb (2014), R ring finger 05-2013    History   Social History  . Marital Status: Widowed    Spouse Name: N/A    Number of Children: 1  . Years of Education: N/A   Occupational History  . retired     Social History Main Topics  .  Smoking status: Never Smoker   . Smokeless tobacco: Never Used  . Alcohol Use: Yes     Comment: rarely  . Drug Use: No  . Sexual Activity: Not on file   Other Topics Concern  . Not on file   Social History Narrative   Moved from a retirement home to a condominium 04-2013, lives by herself    Son lives in [place]        Medication List       This list is accurate as of: 08/17/13  5:20 PM.  Always use your most recent med list.               aspirin 81 MG tablet  Take 81 mg by mouth daily.     atorvastatin 40 MG tablet  Commonly known as:  LIPITOR  TAKE 1 TABLET BY MOUTH ONCE EVERY NIGHT AT BEDTIME     azelastine 0.1 % nasal spray  Commonly known as:  ASTELIN  Place 2 sprays into both nostrils 2 (two) times daily.     BEPREVE 1.5 % Soln  Generic drug:  Bepotastine Besilate  Place 1 drop into both eyes as needed. Itchy eyes  Biotin 10 MG Tabs  Take by mouth. One tab daily     calcium gluconate 500 MG tablet  Take 500 mg by mouth 2 (two) times daily.     CARTIA XT 120 MG 24 hr capsule  Generic drug:  diltiazem  TAKE 1 CAPSULE BY MOUTH EVERY DAY     celecoxib 200 MG capsule  Commonly known as:  CELEBREX  Take 200 mg by mouth daily.     cetirizine 10 MG tablet  Commonly known as:  ZYRTEC  Take 10 mg by mouth. As needed     cyclobenzaprine 5 MG tablet  Commonly known as:  FLEXERIL  Take by mouth. Take as directed per Dr Domingo Cocking, uses it rarely for HAs     DETROL PO  Take by mouth.     gabapentin 100 MG capsule  Commonly known as:  NEURONTIN  Take 200-300 mg by mouth 3 (three) times daily as needed.     lidocaine 5 %  Commonly known as:  LIDODERM  APPLY ONE PATCH AS DIRECTED FOR KNEE PAIN, NO MORE THEN 3 DAILY FOR 12 HOURS     methocarbamol 500 MG tablet  Commonly known as:  ROBAXIN  Take 500 mg by mouth 3 (three) times daily.     multivitamin per tablet  Take 1 tablet by mouth daily.     NASAL SPRAY NA  Place into the nose. OTC     NON  FORMULARY  2 capsules 2 (two) times daily. HydroEye (pt. States she gets from Dr. Doy Mince EYES     predniSONE 10 MG tablet  Commonly known as:  DELTASONE  Take 2 tablets (20 mg total) by mouth daily. x 5 days     topiramate 25 MG capsule  Commonly known as:  TOPAMAX  Take 75 mg by mouth at bedtime.     TYLENOL 8 HOUR PO  Take 500 mg by mouth every 4 (four) hours.     VITAMIN C PO  Take 1 tablet by mouth 2 (two) times daily.           Objective:   Physical Exam BP 102/57  Pulse 78  Temp(Src) 98 F (36.7 C)  Wt 147 lb (66.679 kg)  SpO2 97% General -- alert, well-developed, NAD.  HEENT-- Not pale. TMs normal, throat symmetric, no redness or discharge. Face symmetric, sinuses not tender to palpation. Nose slt  congested. Lungs -- normal respiratory effort, no intercostal retractions, no accessory muscle use, and normal breath sounds; few ronchi w/  Cough only.  Heart-- normal rate, regular rhythm, no murmur.  Extremities-- no pretibial edema bilaterally  Neurologic--  alert & oriented X3. Speech normal, gait normal, strength normal in all extremities. Psych-- Cognition and judgment appear intact. Cooperative with normal attention span and concentration. No anxious or depressed appearing.      Assessment & Plan:  Viral syndrome, Patient afebrile, symptoms are gradually resolving but not back to baseline. Plan: Low dose of steroids to decrease the mucus burden, Rx postnasal dripping w/ Astelin and Flonase. Call if no better in 2 or 3 weeks  BP slightly low today, Good po intake, observe for now

## 2013-08-17 NOTE — Patient Instructions (Signed)
Astelin nasal spray twice a day until better Flonase OTC nasal spray once daily until better Prednisone for 5 days Continue taking OTC Mucinex DM   Next routine visit should be by September 2015,  please make an appointment if you don't have one

## 2013-08-22 ENCOUNTER — Telehealth: Payer: Self-pay | Admitting: *Deleted

## 2013-08-22 NOTE — Telephone Encounter (Signed)
Caller name:  Nupur Relation to pt:  self Call back number:  [phone] [facility] [address]  Reason for call:  Pt called because her lidocaine (LIDODERM) 5 % patch was denied by [facility] due to being prescribed for her back.  Pt said she has never used these for her back, they are for her right knee for arthritis.  She asked that we call [employer] [phone] and tell them it is for her right knee for arthritis.  Pt also states she did not take her last dose of predniSONE (DELTASONE) 10 MG tablet yesterday.  She states she was dizzy Sunday and Monday so she thought that may be what was causing her to be so dizzy.  She was still extremely dizzy yesterday and has been some today but not as bad.  Please advise. bw

## 2013-08-23 NOTE — Telephone Encounter (Signed)
Patient called and made aware.  She stated understanding.  No further questions or concerns voiced at this time.

## 2013-08-23 NOTE — Telephone Encounter (Signed)
Spoke with the pt and she stated that she was feeling much better.  She stated that she don't think she will be dizzy today.  She feels the dizziness came from the Prednisone, which she stopped.  She still has the nose spray which she don't know how long she is to take it.  Please advise.//AB/CMA

## 2013-08-23 NOTE — Telephone Encounter (Signed)
Called [employer].  Spoke with representative, Raquel Sarna.  New prior authorization for the Lidocaine 5% patch was submitted for chronic right knee pain.  Turn around time reported for request was 24-72 hours.  Reference # [id number].  Awaiting response.  PENDING

## 2013-08-23 NOTE — Telephone Encounter (Signed)
thx

## 2013-08-23 NOTE — Telephone Encounter (Signed)
Called and talked to the pt regarding her Lidocaine 5% patch.  Pt stated that [facility] will not cover the Lidocaine patch because on the prior auth if stated that she was using the patch for back pain.  Informed the pt that the prior auth was done,but I was able to tell what was on the prior auth form,but I will have someone to call her back regarding this.  Pt agreed.//AB/CMA

## 2013-08-28 NOTE — Telephone Encounter (Signed)
[name], CMA aware that office is awaiting response from [facility].

## 2013-09-01 ENCOUNTER — Other Ambulatory Visit: Payer: Self-pay | Admitting: Internal Medicine

## 2013-09-19 ENCOUNTER — Telehealth: Payer: Self-pay | Admitting: Internal Medicine

## 2013-09-19 NOTE — Telephone Encounter (Signed)
Please check on. I see that a prior Josem Kaufmann has been worked.

## 2013-09-19 NOTE — Telephone Encounter (Signed)
Caller name:Gracianna Henckel Relation to FK:[id number] Call back number: [phone] or [phone] Pharmacy:  Reason for call: Patient called stating that she needs documentation showing that she uses Lidocaine patches for her knee and why.  Patient would like for [place] to fax it to [employer] [phone] and Ref number [id number]. Please advise

## 2013-09-21 NOTE — Telephone Encounter (Signed)
Letter printed. Awaiting signature. JG//CMA

## 2013-09-21 NOTE — Telephone Encounter (Signed)
Would you like for me to write a letter for the insurance company? Please advise. JG//CMA

## 2013-09-21 NOTE — Telephone Encounter (Signed)
Please prin a letter:  To whom it may concern [name] is a patient of mine, she suffers from chronic right knee pain, she is allergic to a number of medications including the following.  Allergies  Allergen Reactions  . Amitriptyline Hcl     REACTION: hallucinations  . Carisoprodol     REACTION: nightmares,rash,tachycardia  . Codeine     REACTION: "deathly ill" nauesa, dizzy,weak  . Meloxicam     REACTION: sensitive to sun; facial redness; may cause severe abd pain  . Metronidazole     REACTION: abd pain  . Nortriptyline Hcl     REACTION: hallucinations  . Oxaprozin     REACTION: abd pain  . Promethazine Hcl     REACTION: spastic arms and legs flailing while awake and unusual behavior while sleep - walking all over house during night  . Rofecoxib     REACTION: rash  . Sumatriptan     REACTION: rash   As you can see, she is unable to take  NSAIDs or elavil which is great for chronic pain. In my opinion is appropriate to continue using  Lidoderm patches (patient request brand only)

## 2013-09-24 NOTE — Telephone Encounter (Signed)
Signed letter faxed to [facility]. JG//CMA

## 2013-10-03 ENCOUNTER — Ambulatory Visit
Admission: RE | Admit: 2013-10-03 | Discharge: 2013-10-03 | Disposition: A | Discharge status: Home / Self Care | Payer: Medicare PPO | Source: Ambulatory Visit

## 2013-10-03 ENCOUNTER — Encounter (INDEPENDENT_AMBULATORY_CARE_PROVIDER_SITE_OTHER): Payer: Self-pay

## 2013-10-03 DIAGNOSIS — Z1231 Encounter for screening mammogram for malignant neoplasm of breast: Secondary | ICD-10-CM

## 2013-12-05 ENCOUNTER — Other Ambulatory Visit: Payer: Self-pay

## 2013-12-05 ENCOUNTER — Telehealth: Payer: Self-pay

## 2013-12-05 MED ORDER — DILTIAZEM HCL ER COATED BEADS 120 MG PO CP24: ORAL_CAPSULE | ORAL | Status: DC

## 2013-12-05 NOTE — Telephone Encounter (Signed)
Caller name:Taylyn Relation to pt: Call back number:[phone] [phone]  Reason for call: [name] called and said she wanted to change pharmacies and she would also like her CARTIA XT 120 MG 24 hr capsule called in for 90 day supply because it saves her money

## 2013-12-05 NOTE — Telephone Encounter (Signed)
[facility] [address] [facility] with 1 RF.

## 2013-12-06 ENCOUNTER — Other Ambulatory Visit: Payer: Self-pay

## 2013-12-06 ENCOUNTER — Telehealth: Payer: Self-pay | Admitting: *Deleted

## 2013-12-06 MED ORDER — ATORVASTATIN CALCIUM 40 MG PO TABS: ORAL_TABLET | ORAL | Status: DC

## 2013-12-06 NOTE — Telephone Encounter (Signed)
[facility]

## 2013-12-06 NOTE — Telephone Encounter (Signed)
Caller name:  Tache Relation to pt:  self Call back number: [phone]  [facility] [address]  Reason for call:  Pt called requesting refill on  atorvastatin (LIPITOR) 40 MG tablet  Last filled 09/05/2013, #90, no refills Last OV 08/17/2013  Pt request prescription to be for 90 day supply.

## 2013-12-26 ENCOUNTER — Ambulatory Visit (INDEPENDENT_AMBULATORY_CARE_PROVIDER_SITE_OTHER): Payer: Medicare PPO | Admitting: Internal Medicine

## 2013-12-26 ENCOUNTER — Encounter: Payer: Self-pay | Admitting: Internal Medicine

## 2013-12-26 VITALS — BP 127/72 | HR 63 | Temp 97.8°F | Ht 64.0 in | Wt 155.0 lb

## 2013-12-26 DIAGNOSIS — I10 Essential (primary) hypertension: Secondary | ICD-10-CM

## 2013-12-26 DIAGNOSIS — Z0000 Encounter for general adult medical examination without abnormal findings: Secondary | ICD-10-CM

## 2013-12-26 DIAGNOSIS — E785 Hyperlipidemia, unspecified: Secondary | ICD-10-CM

## 2013-12-26 DIAGNOSIS — K5900 Constipation, unspecified: Secondary | ICD-10-CM

## 2013-12-26 LAB — CBC WITH DIFFERENTIAL/PLATELET
BASOS ABS: 0 10*3/uL (ref 0.0–0.1)
Basophils Relative: 0.4 % (ref 0.0–3.0)
EOS PCT: 1.8 % (ref 0.0–5.0)
Eosinophils Absolute: 0.1 10*3/uL (ref 0.0–0.7)
HCT: 35.5 % — ABNORMAL LOW (ref 36.0–46.0)
Hemoglobin: 11.7 g/dL — ABNORMAL LOW (ref 12.0–15.0)
LYMPHS PCT: 12.5 % (ref 12.0–46.0)
Lymphs Abs: 0.9 10*3/uL (ref 0.7–4.0)
MCHC: 33.1 g/dL (ref 30.0–36.0)
MCV: 95.9 fl (ref 78.0–100.0)
MONOS PCT: 7.4 % (ref 3.0–12.0)
Monocytes Absolute: 0.5 10*3/uL (ref 0.1–1.0)
Neutro Abs: 5.6 10*3/uL (ref 1.4–7.7)
Neutrophils Relative %: 77.9 % — ABNORMAL HIGH (ref 43.0–77.0)
PLATELETS: 231 10*3/uL (ref 150.0–400.0)
RBC: 3.7 Mil/uL — ABNORMAL LOW (ref 3.87–5.11)
RDW: 13.7 % (ref 11.5–15.5)
WBC: 7.2 10*3/uL (ref 4.0–10.5)

## 2013-12-26 LAB — BASIC METABOLIC PANEL
BUN: 19 mg/dL (ref 6–23)
CALCIUM: 9.4 mg/dL (ref 8.4–10.5)
CO2: 26 mEq/L (ref 19–32)
Chloride: 106 mEq/L (ref 96–112)
Creatinine, Ser: 1 mg/dL (ref 0.4–1.2)
GFR: 57.09 mL/min — ABNORMAL LOW (ref >60.00)
GLUCOSE: 86 mg/dL (ref 70–99)
Potassium: 3.9 mEq/L (ref 3.5–5.1)
Sodium: 139 mEq/L (ref 135–145)

## 2013-12-26 NOTE — Assessment & Plan Note (Addendum)
Td 2014 shingles shot 2007  pneumonia shot 2004 and 2009  prevnar-- just got a flu shot , likes to wait Colonoscopy w/ Dr Deatra Ina, + polyp 11-04  colonoscopy 04-2008 @ [facility] : Hemorrhoids, diverticuli. No polyps  sees gynecology  mmg [phone] neg, reports had a MMG 2015 normal (10-03-13 per [facility] report, no records) DEXA 2004 and 2009 (normal), ordered one 2014 but not done due to insurance counseled : diet , exercise

## 2013-12-26 NOTE — Assessment & Plan Note (Addendum)
Good compliance of medication, well-controlled per last FLP, she is not fasting, labs on RTC

## 2013-12-26 NOTE — Assessment & Plan Note (Addendum)
controlled, check a BMP

## 2013-12-26 NOTE — Patient Instructions (Signed)
Get your blood work before you leave   Use Fleet enema today and if not successful repeated the enema tomorrow morning. If that is not helping, call the office.  Once you get a bowel movement, start taking MiraLax 17 g every day, Drink plenty of fluids.  Please come back to the office for a check up in 2 weeks    Fall Prevention and Home Safety Falls cause injuries and can affect all age groups. It is possible to use preventive measures to significantly decrease the likelihood of falls. There are many simple measures which can make your home safer and prevent falls. OUTDOORS  Repair cracks and edges of walkways and driveways.  Remove high doorway thresholds.  Trim shrubbery on the main path into your home.  Have good outside lighting.  Clear walkways of tools, rocks, debris, and clutter.  Check that handrails are not broken and are securely fastened. Both sides of steps should have handrails.  Have leaves, snow, and ice cleared regularly.  Use sand or salt on walkways during winter months.  In the garage, clean up grease or oil spills. BATHROOM  Install night lights.  Install grab bars by the toilet and in the tub and shower.  Use non-skid mats or decals in the tub or shower.  Place a plastic non-slip stool in the shower to sit on, if needed.  Keep floors dry and clean up all water on the floor immediately.  Remove soap buildup in the tub or shower on a regular basis.  Secure bath mats with non-slip, double-sided rug tape.  Remove throw rugs and tripping hazards from the floors. BEDROOMS  Install night lights.  Make sure a bedside light is easy to reach.  Do not use oversized bedding.  Keep a telephone by your bedside.  Have a firm chair with side arms to use for getting dressed.  Remove throw rugs and tripping hazards from the floor. KITCHEN  Keep handles on pots and pans turned toward the center of the stove. Use back burners when possible.  Clean up  spills quickly and allow time for drying.  Avoid walking on wet floors.  Avoid hot utensils and knives.  Position shelves so they are not too high or low.  Place commonly used objects within easy reach.  If necessary, use a sturdy step stool with a grab bar when reaching.  Keep electrical cables out of the way.  Do not use floor polish or wax that makes floors slippery. If you must use wax, use non-skid floor wax.  Remove throw rugs and tripping hazards from the floor. STAIRWAYS  Never leave objects on stairs.  Place handrails on both sides of stairways and use them. Fix any loose handrails. Make sure handrails on both sides of the stairways are as long as the stairs.  Check carpeting to make sure it is firmly attached along stairs. Make repairs to worn or loose carpet promptly.  Avoid placing throw rugs at the top or bottom of stairways, or properly secure the rug with carpet tape to prevent slippage. Get rid of throw rugs, if possible.  Have an electrician put in a light switch at the top and bottom of the stairs. OTHER FALL PREVENTION TIPS  Wear low-heel or rubber-soled shoes that are supportive and fit well. Wear closed toe shoes.  When using a stepladder, make sure it is fully opened and both spreaders are firmly locked. Do not climb a closed stepladder.  Add color or contrast paint or  tape to grab bars and handrails in your home. Place contrasting color strips on first and last steps.  Learn and use mobility aids as needed. Install an electrical emergency response system.  Turn on lights to avoid dark areas. Replace light bulbs that burn out immediately. Get light switches that glow.  Arrange furniture to create clear pathways. Keep furniture in the same place.  Firmly attach carpet with non-skid or double-sided tape.  Eliminate uneven floor surfaces.  Select a carpet pattern that does not visually hide the edge of steps.  Be aware of all pets. OTHER HOME SAFETY  TIPS  Set the water temperature for 120 F (48.8 C).  Keep emergency numbers on or near the telephone.  Keep smoke detectors on every level of the home and near sleeping areas. Document Released: 03/26/2002 Document Revised: 10/05/2011 Document Reviewed: 06/25/2011 [facility] Patient Information 2015 [place], [place]. This information is not intended to replace advice given to you by your health care provider. Make sure you discuss any questions you have with your health care provider.

## 2013-12-26 NOTE — Assessment & Plan Note (Addendum)
On and off constipation for years, currently having a episode of constipation and fecal impaction. Unclear if recent initiation of tramadol is triggering the  problem. Plan: For the short-term Will recommend Fleet enema daily x2 or warm water enema If unable to empty, will consider admission for further management For the long-term will recommend miralax, RTC 2 weeks , linzess ? GI ref?

## 2013-12-26 NOTE — Progress Notes (Signed)
Subjective:    Patient ID: [name], female    DOB: 1929-10-30, 80 y.o.   MRN: [id number]  DOS:  12/26/2013 Type of visit - description :   Here for Medicare AWV:   1. Risk factors based on Past M, S, F history: reviewed   2. Physical Activities: active at home, no regular exercise   3. Depression/mood: Neg screening   4. Hearing: No problems noted or reported   5. ADL's: Independent, still drives   6. Fall Risk: no recent, counseled   7. home Safety: does feelsafe at home   8. Height, weight, &visual acuity: see VS, sees eye doctor regulalrly 9. Counseling: provided   10. Labs ordered based on risk factors: if needed   11. Referral Coordination: if needed   12. Care Plan, see assessment and plan   13. Cognitive Assessment: cognition wnl , Motor skills appropriate for age.  In addition, today we discussed the following DJD, sx under better control at this time , on Ultram for few days High cholesterol, on Lipitor, good compliance. Hypertension, SVT--on calcium channel blockers, no SVT symptoms, BP today is satisfactory. Also complained of constipation, this is a life long problem, on and off. Time she has been unable to have a BM despite using laxatives for the last 2 or 3 days. Feels like something is "blocking" they rectum. Denies pencil like  stools, appetite remains normal. Unclear  if Ultram is causing this episode of constipation (is an on and off issue)   ROS Denies nausea, vomiting. No blood in the stools, no abdominal pain and some bloating. No dysuria, gross hematuria or difficulty urinating. No vaginal discharge, bleeding.  Past Medical History  Diagnosis Date  . Hyperlipidemia   . Hypertension   . Anxiety and depression   . Pulmonary nodules     Wert. First seen by CT only 05/04/03, no change on f/u 08/08/06 -Macroscopic changes rml 04/27/07 > resolved april 6,2010 no further w/u   . Knee pain     right chronic  . Back pain     lumbar  . SVT  (supraventricular tachycardia)     sees Dr.Ross  . Hydronephrosis     Right 2008 s/p urology eval CT/cysto: observation/monitor  . Headache(784.0)   . GI (gastrointestinal bleed)     hx of colon polyps 2004, diverticulosis, internal hemmorhids  . Migraine     on topamax , per neuro    Past Surgical History  Procedure Laterality Date  . Abdominal hysterectomy    . Rotator cuff repair  9/-2011  . Total knee arthroplasty      right  . Cataract extraction  2008    Dr.Digby  . R ovary removed    . Lumbar reconstructive surgery  01/07/2009  . Appendectomy    . Cholecystectomy    . Tonsillectomy    . Bladder repair    . Shoulder surgery  12-2011    LEFT  . Trigger finger surgery      L thumb (2014), R ring finger 05-2013    History   Social History  . Marital Status: Widowed    Spouse Name: N/A    Number of Children: 1  . Years of Education: N/A   Occupational History  . retired     Social History Main Topics  . Smoking status: Never Smoker   . Smokeless tobacco: Never Used  . Alcohol Use: Yes     Comment: rarely  . Drug Use:  No  . Sexual Activity: Not on file   Other Topics Concern  . Not on file   Social History Narrative   Moved from a retirement home to a condominium 04-2013, lives by herself    Son lives in [place]     Family History  Problem Relation Age of Onset  . Colon cancer Neg Hx   . Breast cancer Mother     later in life  . Lung cancer Father     apparently had TB  . Diabetes Mother     late in life  . Heart disease Mother     late onset  . Heart disease Father     late onset  . Prostate cancer Father   . Colitis Brother       Medication List       This list is accurate as of: 12/26/13 11:59 PM.  Always use your most recent med list.               aspirin 81 MG tablet  Take 81 mg by mouth daily.     atorvastatin 40 MG tablet  Commonly known as:  LIPITOR  TAKE 1 TABLET BY MOUTH AT BEDTIME     azelastine 0.1 % nasal spray    Commonly known as:  ASTELIN  Place 2 sprays into both nostrils 2 (two) times daily.     Biotin 10 MG Tabs  Take by mouth. One tab daily     calcium gluconate 500 MG tablet  Take 500 mg by mouth 2 (two) times daily.     celecoxib 200 MG capsule  Commonly known as:  CELEBREX  Take 200 mg by mouth daily.     cetirizine 10 MG tablet  Commonly known as:  ZYRTEC  Take 10 mg by mouth. As needed     cyclobenzaprine 5 MG tablet  Commonly known as:  FLEXERIL  Take by mouth. Take as directed per Dr Domingo Cocking, uses it rarely for HAs     DETROL PO  Take by mouth.     diltiazem 120 MG 24 hr capsule  Commonly known as:  CARTIA XT  TAKE 1 CAPSULE BY MOUTH EVERY DAY     gabapentin 100 MG capsule  Commonly known as:  NEURONTIN  Take 200-300 mg by mouth 3 (three) times daily as needed.     methocarbamol 500 MG tablet  Commonly known as:  ROBAXIN  Take 500 mg by mouth 3 (three) times daily.     multivitamin per tablet  Take 1 tablet by mouth daily.     NASAL SPRAY NA  Place into the nose. OTC     NON FORMULARY  2 capsules 2 (two) times daily. HydroEye (pt. States she gets from Dr. Doy Mince EYES     STOOL SOFTENER/LAXATIVE PO  Take 2 tablets by mouth at bedtime.     topiramate 25 MG capsule  Commonly known as:  TOPAMAX  Take 75 mg by mouth at bedtime.     traMADol 50 MG tablet  Commonly known as:  ULTRAM  Take 1 tablet by mouth every 12 (twelve) hours as needed.     TYLENOL 8 HOUR PO  Take 500 mg by mouth every 4 (four) hours.     VITAMIN C PO  Take 1 tablet by mouth 2 (two) times daily.           Objective:   Physical Exam BP 127/72  Pulse 63  Temp(Src) 97.8 F (36.6 C) (  Oral)  Ht 5\' 4"  (1.626 m)  Wt 155 lb (70.308 kg)  BMI 26.59 kg/m2  SpO2 98% General -- alert, well-developed, NAD.  Neck --no thyromegaly  Lungs -- normal respiratory effort, no intercostal retractions, no accessory muscle use, and normal breath sounds.  Heart-- normal rate, regular  rhythm, no murmur.  Abdomen-- Not distended, good bowel sounds,soft, non-tender. No rebound or rigidity.   Rectal-- No external abnormalities noted.  sphincter tone slt decreased? She has watery-brown stools, noted a hard round stool just at the end of the rectum No blood. Extremities-- no pretibial edema bilaterally  Neurologic--  alert & oriented X3. Speech normal, gait appropriate for age, strength symmetric and appropriate for age.  Psych-- Cognition and judgment appear intact. Cooperative with normal attention span and concentration. No anxious or depressed appearing.       Assessment & Plan:

## 2013-12-26 NOTE — Progress Notes (Signed)
Pre visit review using our clinic review tool, if applicable. No additional management support is needed unless otherwise documented below in the visit note. 

## 2013-12-27 ENCOUNTER — Telehealth: Payer: Self-pay

## 2013-12-27 NOTE — Telephone Encounter (Signed)
thx

## 2013-12-27 NOTE — Telephone Encounter (Signed)
Spoke with Pt about her fecal impaction, the fleet enema did work and Pt states she is feeling much better.

## 2014-01-07 ENCOUNTER — Ambulatory Visit (INDEPENDENT_AMBULATORY_CARE_PROVIDER_SITE_OTHER): Payer: Medicare PPO | Admitting: Internal Medicine

## 2014-01-07 ENCOUNTER — Encounter: Payer: Self-pay | Admitting: Internal Medicine

## 2014-01-07 VITALS — BP 122/64 | HR 70 | Temp 97.8°F | Wt 152.0 lb

## 2014-01-07 DIAGNOSIS — K5900 Constipation, unspecified: Secondary | ICD-10-CM

## 2014-01-07 DIAGNOSIS — D649 Anemia, unspecified: Secondary | ICD-10-CM

## 2014-01-07 LAB — IRON: Iron: 58 ug/dL (ref 42–145)

## 2014-01-07 LAB — HEMOGLOBIN: HEMOGLOBIN: 12.3 g/dL (ref 12.0–15.0)

## 2014-01-07 LAB — FERRITIN: Ferritin: 62 ng/mL (ref 10.0–291.0)

## 2014-01-07 NOTE — Progress Notes (Signed)
Subjective:    Patient ID: [name], female    DOB: 1929/08/08, 80 y.o.   MRN: [id number]  DOS:  01/07/2014 Type of visit - description : Followup  Interval history: Was recently seen with constipation, improve after a Fleet enema, she's not taking Metamucil,Still having bowel movements but not daily.    ROS Denies fever, chills. Appetite normal. No nausea or vomiting.   Past Medical History  Diagnosis Date  . Hyperlipidemia   . Hypertension   . Anxiety and depression   . Pulmonary nodules     Wert. First seen by CT only 05/04/03, no change on f/u 08/08/06 -Macroscopic changes rml 04/27/07 > resolved april 6,2010 no further w/u   . Knee pain     right chronic  . Back pain     lumbar  . SVT (supraventricular tachycardia)     sees Dr.Ross  . Hydronephrosis     Right 2008 s/p urology eval CT/cysto: observation/monitor  . Headache(784.0)   . GI (gastrointestinal bleed)     hx of colon polyps 2004, diverticulosis, internal hemmorhids  . Migraine     on topamax , per neuro    Past Surgical History  Procedure Laterality Date  . Abdominal hysterectomy    . Rotator cuff repair  9/-2011  . Total knee arthroplasty      right  . Cataract extraction  2008    Dr.Digby  . R ovary removed    . Lumbar reconstructive surgery  01/07/2009  . Appendectomy    . Cholecystectomy    . Tonsillectomy    . Bladder repair    . Shoulder surgery  12-2011    LEFT  . Trigger finger surgery      L thumb (2014), R ring finger 05-2013    History   Social History  . Marital Status: Widowed    Spouse Name: N/A    Number of Children: 1  . Years of Education: N/A   Occupational History  . retired     Social History Main Topics  . Smoking status: Never Smoker   . Smokeless tobacco: Never Used  . Alcohol Use: Yes     Comment: rarely  . Drug Use: No  . Sexual Activity: Not on file   Other Topics Concern  . Not on file   Social History Narrative   Moved from a retirement home  to a condominium 04-2013, lives by herself    Son lives in [place]        Medication List       This list is accurate as of: 01/07/14 11:59 PM.  Always use your most recent med list.               aspirin 81 MG tablet  Take 81 mg by mouth daily.     atorvastatin 40 MG tablet  Commonly known as:  LIPITOR  TAKE 1 TABLET BY MOUTH AT BEDTIME     azelastine 0.1 % nasal spray  Commonly known as:  ASTELIN  Place 2 sprays into both nostrils 2 (two) times daily.     Biotin 10 MG Tabs  Take by mouth. One tab daily     calcium gluconate 500 MG tablet  Take 500 mg by mouth 2 (two) times daily.     celecoxib 200 MG capsule  Commonly known as:  CELEBREX  Take 200 mg by mouth daily.     cetirizine 10 MG tablet  Commonly known as:  ZYRTEC  Take 10 mg by mouth. As needed     cyclobenzaprine 5 MG tablet  Commonly known as:  FLEXERIL  Take by mouth. Take as directed per Dr Domingo Cocking, uses it rarely for HAs     DETROL PO  Take by mouth.     diltiazem 120 MG 24 hr capsule  Commonly known as:  CARTIA XT  TAKE 1 CAPSULE BY MOUTH EVERY DAY     gabapentin 100 MG capsule  Commonly known as:  NEURONTIN  Take 200-300 mg by mouth 3 (three) times daily as needed.     methocarbamol 500 MG tablet  Commonly known as:  ROBAXIN  Take 500 mg by mouth 3 (three) times daily.     multivitamin per tablet  Take 1 tablet by mouth daily.     NASAL SPRAY NA  Place into the nose. OTC     NON FORMULARY  2 capsules 2 (two) times daily. HydroEye (pt. States she gets from Dr. Doy Mince EYES     STOOL SOFTENER/LAXATIVE PO  Take 2 tablets by mouth at bedtime.     topiramate 25 MG capsule  Commonly known as:  TOPAMAX  Take 75 mg by mouth at bedtime.     TYLENOL 8 HOUR PO  Take 500 mg by mouth every 4 (four) hours.     VITAMIN C PO  Take 1 tablet by mouth 2 (two) times daily.           Objective:   Physical Exam BP 122/64  Pulse 70  Temp(Src) 97.8 F (36.6 C) (Oral)  Wt 152 lb  (68.947 kg)  SpO2 98% General -- alert, well-developed, NAD.     Abdomen-- Not distended, good bowel sounds,soft, non-tender. Extremities-- no pretibial edema bilaterally  Neurologic--  alert & oriented X3. Speech normal, gait appropriate for age, strength symmetric and appropriate for age.  Psych-- Cognition and judgment appear intact. Cooperative with normal attention span and concentration. No anxious or depressed appearing.      Assessment & Plan:     Mild anemia noted @ last labs, check a hemoglobin, iron and ferritin.

## 2014-01-07 NOTE — Patient Instructions (Addendum)
Get your blood work before you leave    Please come back to the office in 3 months for a routine check up, fasting   Start taking MiraLax 17 g every day, Drink plenty of fluids.  Metamucil daily  If you get constipated use a fleet enema  Call if symptoms severe, fever, abdominal pain, nausea, vomiting

## 2014-01-07 NOTE — Assessment & Plan Note (Addendum)
Recently seen with impaction, it resolved. She is not taking MiraLax as recommended. She is not taking any pain medications except for Celebrex Plan:  Miralax, Metamucil. Drink plenty of fluids Consider linzess-Lactulose, consider d/c CCBs although she has been taking them   for long time.

## 2014-01-07 NOTE — Progress Notes (Signed)
Pre visit review using our clinic review tool, if applicable. No additional management support is needed unless otherwise documented below in the visit note. 

## 2014-01-09 ENCOUNTER — Ambulatory Visit: Payer: Medicare PPO | Admitting: Internal Medicine

## 2014-02-26 ENCOUNTER — Encounter: Payer: Self-pay | Admitting: Internal Medicine

## 2014-03-11 ENCOUNTER — Other Ambulatory Visit: Payer: Self-pay

## 2014-03-13 ENCOUNTER — Other Ambulatory Visit: Payer: Self-pay | Admitting: Internal Medicine

## 2014-03-18 ENCOUNTER — Other Ambulatory Visit: Payer: Self-pay

## 2014-04-16 ENCOUNTER — Encounter: Payer: Self-pay | Admitting: Internal Medicine

## 2014-04-16 ENCOUNTER — Ambulatory Visit (INDEPENDENT_AMBULATORY_CARE_PROVIDER_SITE_OTHER): Payer: Medicare PPO | Admitting: Internal Medicine

## 2014-04-16 VITALS — BP 133/79 | HR 70 | Temp 97.6°F | Ht 64.0 in | Wt 151.0 lb

## 2014-04-16 DIAGNOSIS — E785 Hyperlipidemia, unspecified: Secondary | ICD-10-CM

## 2014-04-16 DIAGNOSIS — G44219 Episodic tension-type headache, not intractable: Secondary | ICD-10-CM

## 2014-04-16 DIAGNOSIS — K5900 Constipation, unspecified: Secondary | ICD-10-CM

## 2014-04-16 LAB — LIPID PANEL
Cholesterol: 144 mg/dL (ref 0–200)
HDL: 37.9 mg/dL — ABNORMAL LOW (ref >39.00)
LDL CALC: 71 mg/dL (ref 0–99)
NonHDL: 106.1
Total CHOL/HDL Ratio: 4
Triglycerides: 176 mg/dL — ABNORMAL HIGH (ref 0.0–149.0)
VLDL: 35.2 mg/dL (ref 0.0–40.0)

## 2014-04-16 LAB — AST: AST: 20 U/L (ref 0–37)

## 2014-04-16 LAB — ALT: ALT: 9 U/L (ref 0–35)

## 2014-04-16 NOTE — Progress Notes (Signed)
Subjective:    Patient ID: [name], female    DOB: [birth year], 80 y.o.   MRN: [id number]  DOS:  04/16/2014 Type of visit - description : rov Interval history: Taking MiraLAX for constipation, from time to time she has an accident with leaking stools which are loose. Complain of hip pain on and off on the left. X-ray? Medications reviewed, good compliance without apparent side effects Due for a FLP.   ROS Denies fever chills No nausea, vomiting, blood in the stools or abdominal pain  Past Medical History  Diagnosis Date  . Hyperlipidemia   . Hypertension   . Anxiety and depression   . Pulmonary nodules     Wert. First seen by CT only 05/04/03, no change on f/u 08/08/06 -Macroscopic changes rml 04/27/07 > resolved april 6,2010 no further w/u   . Knee pain     right chronic  . Back pain     lumbar  . SVT (supraventricular tachycardia)     sees Dr.Ross  . Hydronephrosis     Right 2008 s/p urology eval CT/cysto: observation/monitor  . Headache(784.0)   . GI (gastrointestinal bleed)     hx of colon polyps 2004, diverticulosis, internal hemmorhids  . Migraine     on topamax , per neuro    Past Surgical History  Procedure Laterality Date  . Abdominal hysterectomy    . Rotator cuff repair  9/-2011  . Total knee arthroplasty      right  . Cataract extraction  2008    Dr.Digby  . R ovary removed    . Lumbar reconstructive surgery  01/07/2009  . Appendectomy    . Cholecystectomy    . Tonsillectomy    . Bladder repair    . Shoulder surgery  12-2011    LEFT  . Trigger finger surgery      L thumb (2014), R ring finger 05-2013  . Basal cell carcinoma excision      History   Social History  . Marital Status: Widowed    Spouse Name: N/A    Number of Children: 1  . Years of Education: N/A   Occupational History  . retired     Social History Main Topics  . Smoking status: Never Smoker   . Smokeless tobacco: Never Used  . Alcohol Use: Yes     Comment:  rarely  . Drug Use: No  . Sexual Activity: Not on file   Other Topics Concern  . Not on file   Social History Narrative   Moved from a retirement home to a condominium 04-2013, lives by herself    Son lives in [place]        Medication List       This list is accurate as of: 04/16/14  7:05 PM.  Always use your most recent med list.               aspirin 81 MG tablet  Take 81 mg by mouth daily.     atorvastatin 40 MG tablet  Commonly known as:  LIPITOR  TAKE 1 TABLET BY MOUTH AT BEDTIME     azelastine 0.1 % nasal spray  Commonly known as:  ASTELIN  Place 2 sprays into both nostrils 2 (two) times daily.     Biotin 10 MG Tabs  Take by mouth. One tab daily     calcium gluconate 500 MG tablet  Take 500 mg by mouth 2 (two) times daily.  celecoxib 200 MG capsule  Commonly known as:  CELEBREX  Take 200 mg by mouth daily.     cetirizine 10 MG tablet  Commonly known as:  ZYRTEC  Take 10 mg by mouth. As needed     cyclobenzaprine 5 MG tablet  Commonly known as:  FLEXERIL  Take by mouth. Take as directed per Dr Domingo Cocking, uses it rarely for HAs     DETROL PO  Take by mouth.     diltiazem 120 MG 24 hr capsule  Commonly known as:  CARTIA XT  TAKE 1 CAPSULE BY MOUTH EVERY DAY     gabapentin 100 MG capsule  Commonly known as:  NEURONTIN  Take 200-300 mg by mouth 3 (three) times daily as needed.     methocarbamol 500 MG tablet  Commonly known as:  ROBAXIN  Take 500 mg by mouth 3 (three) times daily.     multivitamin per tablet  Take 1 tablet by mouth daily.     NASAL SPRAY NA  Place into the nose. OTC     NON FORMULARY  2 capsules 2 (two) times daily. HydroEye (pt. States she gets from Dr. Doy Mince EYES     polyethylene glycol packet  Commonly known as:  MIRALAX / GLYCOLAX  Take 8 g by mouth daily.     topiramate 25 MG capsule  Commonly known as:  TOPAMAX  Take 75 mg by mouth at bedtime.     TYLENOL 8 HOUR PO  Take 500 mg by mouth every 4 (four)  hours.     VITAMIN C PO  Take 1 tablet by mouth 2 (two) times daily.           Objective:   Physical Exam BP 133/79 mmHg  Pulse 70  Temp(Src) 97.6 F (36.4 C) (Oral)  Ht 5\' 4"  (1.626 m)  Wt 151 lb (68.493 kg)  BMI 25.91 kg/m2  SpO2 95% General -- alert, well-developed, NAD.  Lungs -- normal respiratory effort, no intercostal retractions, no accessory muscle use, and normal breath sounds.  Heart-- normal rate, regular rhythm, no murmur.  Abdomen-- Not distended, good bowel sounds,soft, non-tender.  Extremities-- no pretibial edema bilaterally  Neurologic--  alert & oriented X3. Speech normal, gait appropriate for age, strength symmetric and appropriate for age.  Psych-- Cognition and judgment appear intact. Cooperative with normal attention span and concentration. No anxious or depressed appearing.        Assessment & Plan:  Hip pain, recommend to discuss with ortho

## 2014-04-16 NOTE — Progress Notes (Signed)
Pre visit review using our clinic review tool, if applicable. No additional management support is needed unless otherwise documented below in the visit note. 

## 2014-04-16 NOTE — Assessment & Plan Note (Signed)
Currently taking MiraLAX daily and occasionally has loose stools and stools leaks. She's not taking Metamucil due to abdominal pain. Other medications that could affect her GI tract are Cardizem and OTC calcium. Plan: Decrease MiraLAX to half dose, see if that works

## 2014-04-16 NOTE — Assessment & Plan Note (Signed)
Continue with Lipitor, check a FLP, AST, ALT

## 2014-04-16 NOTE — Assessment & Plan Note (Signed)
Currently on Topamax, hardly ever has any problems

## 2014-04-16 NOTE — Patient Instructions (Signed)
Get your blood work before you leave   Take half of MiraLAX dose daily, call if the problems with your stools continue.   Please come back to the office in 6 months  for a routine check up

## 2014-04-22 ENCOUNTER — Telehealth: Payer: Self-pay | Admitting: Internal Medicine

## 2014-04-22 NOTE — Telephone Encounter (Signed)
Caller name: Chariti Relation to pt: self Call back number: [phone] Pharmacy:  Reason for call:   Patient wanted to know if Dr. Larose Kells wanted her to have the pneumonia inj?

## 2014-04-22 NOTE — Telephone Encounter (Signed)
Pt is wanting advice on Prevnar 13, if she should have it or not. Please advise.

## 2014-04-22 NOTE — Telephone Encounter (Signed)
Yes, I do recommend her to proceed with a Prevnar

## 2014-04-23 NOTE — Telephone Encounter (Signed)
Please inform Pt, she can have the new pneumonia shot, Prevnar 13. She can make a nurse visit anytime and receive that.

## 2014-04-23 NOTE — Telephone Encounter (Signed)
Left message for patient to return my call.

## 2014-04-30 ENCOUNTER — Ambulatory Visit: Payer: Medicare PPO

## 2014-05-30 ENCOUNTER — Other Ambulatory Visit: Payer: Self-pay | Admitting: Internal Medicine

## 2014-06-05 LAB — HM PAP SMEAR: HM Pap smear: NORMAL

## 2014-09-02 ENCOUNTER — Other Ambulatory Visit: Payer: Self-pay

## 2014-09-02 DIAGNOSIS — Z1231 Encounter for screening mammogram for malignant neoplasm of breast: Secondary | ICD-10-CM

## 2014-09-04 ENCOUNTER — Other Ambulatory Visit: Payer: Self-pay | Admitting: Internal Medicine

## 2014-09-18 LAB — HM MAMMOGRAPHY: HM Mammogram: NORMAL

## 2014-10-07 ENCOUNTER — Ambulatory Visit
Admission: RE | Admit: 2014-10-07 | Discharge: 2014-10-07 | Disposition: A | Discharge status: Home / Self Care | Payer: Medicare PPO | Source: Ambulatory Visit

## 2014-10-07 DIAGNOSIS — Z1231 Encounter for screening mammogram for malignant neoplasm of breast: Secondary | ICD-10-CM

## 2014-10-14 ENCOUNTER — Ambulatory Visit (INDEPENDENT_AMBULATORY_CARE_PROVIDER_SITE_OTHER): Payer: Medicare PPO | Admitting: Internal Medicine

## 2014-10-14 ENCOUNTER — Encounter: Payer: Self-pay | Admitting: Internal Medicine

## 2014-10-14 VITALS — BP 126/78 | HR 63 | Temp 97.7°F | Ht 64.0 in | Wt 150.0 lb

## 2014-10-14 DIAGNOSIS — K5900 Constipation, unspecified: Secondary | ICD-10-CM

## 2014-10-14 DIAGNOSIS — E785 Hyperlipidemia, unspecified: Secondary | ICD-10-CM

## 2014-10-14 DIAGNOSIS — M1990 Unspecified osteoarthritis, unspecified site: Secondary | ICD-10-CM | POA: Diagnosis not present

## 2014-10-14 LAB — BASIC METABOLIC PANEL
BUN: 17 mg/dL (ref 6–23)
CO2: 24 mEq/L (ref 19–32)
CREATININE: 0.97 mg/dL (ref 0.40–1.20)
Calcium: 9.6 mg/dL (ref 8.4–10.5)
Chloride: 109 mEq/L (ref 96–112)
GFR: 58.34 mL/min — AB (ref >60.00)
Glucose, Bld: 88 mg/dL (ref 70–99)
Potassium: 4.2 mEq/L (ref 3.5–5.1)
Sodium: 140 mEq/L (ref 135–145)

## 2014-10-14 LAB — CBC WITH DIFFERENTIAL/PLATELET
Basophils Absolute: 0.1 10*3/uL (ref 0.0–0.1)
Basophils Relative: 1.3 % (ref 0.0–3.0)
Eosinophils Absolute: 0.2 10*3/uL (ref 0.0–0.7)
Eosinophils Relative: 2.1 % (ref 0.0–5.0)
HCT: 38.5 % (ref 36.0–46.0)
HEMOGLOBIN: 12.6 g/dL (ref 12.0–15.0)
LYMPHS PCT: 13.4 % (ref 12.0–46.0)
Lymphs Abs: 1 10*3/uL (ref 0.7–4.0)
MCHC: 32.6 g/dL (ref 30.0–36.0)
MCV: 96.4 fl (ref 78.0–100.0)
Monocytes Absolute: 0.7 10*3/uL (ref 0.1–1.0)
Monocytes Relative: 8.7 % (ref 3.0–12.0)
NEUTROS ABS: 5.8 10*3/uL (ref 1.4–7.7)
NEUTROS PCT: 74.5 % (ref 43.0–77.0)
Platelets: 275 10*3/uL (ref 150.0–400.0)
RBC: 4 Mil/uL (ref 3.87–5.11)
RDW: 14.3 % (ref 11.5–15.5)
WBC: 7.7 10*3/uL (ref 4.0–10.5)

## 2014-10-14 NOTE — Progress Notes (Signed)
Subjective:    Patient ID: TNYA ADES, female    DOB: 13-Mar-1930, 81 y.o.   MRN: [id number]  DOS:  10/14/2014  Type of visit - description : ros Interval history: Chronic constipation, taking half dose of MiraLAX and is still having diarrhea and occasionally fecal incontinence. DJD, continue with pain at the fifth right finger, on Celebrex. Headaches, currently not an issue, to see neurology in a couple of months Dry eye continued to bother her Med list reviewed, good compliance,   Review of Systems No blood in the stools, no abd  pain  Past Medical History  Diagnosis Date  . Hyperlipidemia   . Hypertension   . Anxiety and depression   . Pulmonary nodules     Wert. First seen by CT only 05/04/03, no change on f/u 08/08/06 -Macroscopic changes rml 04/27/07 > resolved april 6,2010 no further w/u   . Knee pain     right chronic  . Back pain     lumbar  . SVT (supraventricular tachycardia)     sees Dr.Ross  . Hydronephrosis     Right 2008 s/p urology eval CT/cysto: observation/monitor  . Headache(784.0)   . GI (gastrointestinal bleed)     hx of colon polyps 2004, diverticulosis, internal hemmorhids  . Migraine     on topamax , per neuro    Past Surgical History  Procedure Laterality Date  . Abdominal hysterectomy    . Rotator cuff repair  9/-2011  . Total knee arthroplasty      right  . Cataract extraction  2008    Dr.Digby  . R ovary removed    . Lumbar reconstructive surgery  01/07/2009  . Appendectomy    . Cholecystectomy    . Tonsillectomy    . Bladder repair    . Shoulder surgery  12-2011    LEFT  . Trigger finger surgery      L thumb (2014), R ring finger 05-2013  . Basal cell carcinoma excision      History   Social History  . Marital Status: Widowed    Spouse Name: N/A  . Number of Children: 1  . Years of Education: N/A   Occupational History  . retired     Social History Main Topics  . Smoking status: Never Smoker   . Smokeless tobacco:  Never Used  . Alcohol Use: Yes     Comment: rarely  . Drug Use: No  . Sexual Activity: Not on file   Other Topics Concern  . Not on file   Social History Narrative   Moved from a retirement home to a condominium 04-2013, lives by herself    Son lives in [place]        Medication List       This list is accurate as of: 10/14/14  5:22 PM.  Always use your most recent med list.               aspirin 81 MG tablet  Take 81 mg by mouth daily.     atorvastatin 40 MG tablet  Commonly known as:  LIPITOR  Take 1 tablet (40 mg total) by mouth at bedtime.     azelastine 0.1 % nasal spray  Commonly known as:  ASTELIN  Place 2 sprays into both nostrils 2 (two) times daily.     Biotin 10 MG Tabs  Take by mouth. One tab daily     calcium gluconate 500 MG tablet  Take 500  mg by mouth 2 (two) times daily.     celecoxib 200 MG capsule  Commonly known as:  CELEBREX  Take 200 mg by mouth daily.     cetirizine 10 MG tablet  Commonly known as:  ZYRTEC  Take 10 mg by mouth. As needed     DETROL PO  Take by mouth.     diltiazem 120 MG 24 hr capsule  Commonly known as:  CARDIZEM CD  TAKE 1 CAPSULE BY MOUTH EVERY DAY     gabapentin 100 MG capsule  Commonly known as:  NEURONTIN  Take 200-300 mg by mouth 3 (three) times daily as needed.     methocarbamol 500 MG tablet  Commonly known as:  ROBAXIN  Take 500 mg by mouth 3 (three) times daily.     multivitamin per tablet  Take 1 tablet by mouth daily.     NON FORMULARY  2 capsules 2 (two) times daily. HydroEye (pt. States she gets from Dr. Doy Mince EYES     polyethylene glycol packet  Commonly known as:  MIRALAX / GLYCOLAX  Take 8 g by mouth daily.     topiramate 25 MG capsule  Commonly known as:  TOPAMAX  Take 50 mg by mouth at bedtime.     TYLENOL 8 HOUR PO  Take 500 mg by mouth every 4 (four) hours.     VITAMIN C PO  Take 1 tablet by mouth 2 (two) times daily.           Objective:   Physical Exam BP  126/78 mmHg  Pulse 63  Temp(Src) 97.7 F (36.5 C) (Oral)  Ht 5\' 4"  (1.626 m)  Wt 150 lb (68.04 kg)  BMI 25.73 kg/m2  SpO2 97%  General:   Well developed, well nourished . NAD.  HEENT:  Normocephalic . Face symmetric, atraumatic Lungs:  CTA B Normal respiratory effort, no intercostal retractions, no accessory muscle use. Heart: RRR,  no murmur.  No pretibial edema bilaterally  MSK: Hands with changes consistent with DJD but no actual synovitis Skin: Not pale. Not jaundice Neurologic:  alert & oriented X3.  Speech normal, gait appropriate for age and unassisted Psych--  Cognition and judgment appear intact.  Cooperative with normal attention span and concentration.  Behavior appropriate. No anxious or depressed appearing.       Assessment & Plan:   Dry eye, follow-up by ophthalmology, recommend to discontinue Astelin as it may contribute to her dry eye syndrome    Hyperlipidemia, well-controlled per last FLP.

## 2014-10-14 NOTE — Patient Instructions (Signed)
Go to the lab.

## 2014-10-14 NOTE — Progress Notes (Signed)
Pre visit review using our clinic review tool, if applicable. No additional management support is needed unless otherwise documented below in the visit note. 

## 2014-10-14 NOTE — Assessment & Plan Note (Signed)
  DJD, on daily Celebrex, will check a BMP. Reports persistent finger pain, recommend to see orthopedic surgery

## 2014-10-14 NOTE — Assessment & Plan Note (Signed)
Constipation, History of chronic constipation, now MiraLAX even at half dose is causing occasional diarrhea with fecal incontinence. Request a GI referral, will arrange, likes to see a new GI

## 2014-11-04 DIAGNOSIS — R51 Headache: Secondary | ICD-10-CM | POA: Diagnosis not present

## 2014-11-04 DIAGNOSIS — G43719 Chronic migraine without aura, intractable, without status migrainosus: Secondary | ICD-10-CM | POA: Diagnosis not present

## 2014-11-04 DIAGNOSIS — Z79899 Other long term (current) drug therapy: Secondary | ICD-10-CM | POA: Diagnosis not present

## 2014-11-06 DIAGNOSIS — H01001 Unspecified blepharitis right upper eyelid: Secondary | ICD-10-CM | POA: Diagnosis not present

## 2014-11-06 DIAGNOSIS — H01002 Unspecified blepharitis right lower eyelid: Secondary | ICD-10-CM | POA: Diagnosis not present

## 2014-11-06 DIAGNOSIS — H524 Presbyopia: Secondary | ICD-10-CM | POA: Diagnosis not present

## 2014-11-06 DIAGNOSIS — H01004 Unspecified blepharitis left upper eyelid: Secondary | ICD-10-CM | POA: Diagnosis not present

## 2014-11-06 DIAGNOSIS — H01005 Unspecified blepharitis left lower eyelid: Secondary | ICD-10-CM | POA: Diagnosis not present

## 2014-11-06 DIAGNOSIS — H04123 Dry eye syndrome of bilateral lacrimal glands: Secondary | ICD-10-CM | POA: Diagnosis not present

## 2014-11-13 DIAGNOSIS — L820 Inflamed seborrheic keratosis: Secondary | ICD-10-CM | POA: Diagnosis not present

## 2014-11-13 DIAGNOSIS — L821 Other seborrheic keratosis: Secondary | ICD-10-CM | POA: Diagnosis not present

## 2014-11-13 DIAGNOSIS — D485 Neoplasm of uncertain behavior of skin: Secondary | ICD-10-CM | POA: Diagnosis not present

## 2014-11-13 DIAGNOSIS — Z85828 Personal history of other malignant neoplasm of skin: Secondary | ICD-10-CM | POA: Diagnosis not present

## 2014-11-16 DIAGNOSIS — N189 Chronic kidney disease, unspecified: Secondary | ICD-10-CM | POA: Diagnosis not present

## 2014-11-16 DIAGNOSIS — N3281 Overactive bladder: Secondary | ICD-10-CM | POA: Diagnosis not present

## 2014-11-16 DIAGNOSIS — I129 Hypertensive chronic kidney disease with stage 1 through stage 4 chronic kidney disease, or unspecified chronic kidney disease: Secondary | ICD-10-CM | POA: Diagnosis not present

## 2014-11-16 DIAGNOSIS — M1990 Unspecified osteoarthritis, unspecified site: Secondary | ICD-10-CM | POA: Diagnosis not present

## 2014-11-16 DIAGNOSIS — G629 Polyneuropathy, unspecified: Secondary | ICD-10-CM | POA: Diagnosis not present

## 2014-11-16 DIAGNOSIS — E785 Hyperlipidemia, unspecified: Secondary | ICD-10-CM | POA: Diagnosis not present

## 2014-11-16 DIAGNOSIS — Z6826 Body mass index (BMI) 26.0-26.9, adult: Secondary | ICD-10-CM | POA: Diagnosis not present

## 2014-11-16 DIAGNOSIS — G43909 Migraine, unspecified, not intractable, without status migrainosus: Secondary | ICD-10-CM | POA: Diagnosis not present

## 2014-11-16 DIAGNOSIS — M62838 Other muscle spasm: Secondary | ICD-10-CM | POA: Diagnosis not present

## 2014-11-26 DIAGNOSIS — H04123 Dry eye syndrome of bilateral lacrimal glands: Secondary | ICD-10-CM | POA: Diagnosis not present

## 2014-11-26 DIAGNOSIS — H01002 Unspecified blepharitis right lower eyelid: Secondary | ICD-10-CM | POA: Diagnosis not present

## 2014-11-26 DIAGNOSIS — H524 Presbyopia: Secondary | ICD-10-CM | POA: Diagnosis not present

## 2014-11-26 DIAGNOSIS — H01004 Unspecified blepharitis left upper eyelid: Secondary | ICD-10-CM | POA: Diagnosis not present

## 2014-11-26 DIAGNOSIS — H01005 Unspecified blepharitis left lower eyelid: Secondary | ICD-10-CM | POA: Diagnosis not present

## 2014-11-26 DIAGNOSIS — H01001 Unspecified blepharitis right upper eyelid: Secondary | ICD-10-CM | POA: Diagnosis not present

## 2014-11-27 ENCOUNTER — Encounter: Payer: Self-pay | Admitting: Gastroenterology

## 2014-12-19 ENCOUNTER — Other Ambulatory Visit: Payer: Self-pay

## 2014-12-19 MED ORDER — DILTIAZEM HCL ER COATED BEADS 120 MG PO CP24: 120.0000 mg | ORAL_CAPSULE | Freq: Every day | ORAL | Status: DC

## 2014-12-19 MED ORDER — ATORVASTATIN CALCIUM 40 MG PO TABS: 40.0000 mg | ORAL_TABLET | Freq: Every day | ORAL | Status: DC

## 2014-12-20 ENCOUNTER — Encounter: Payer: Self-pay | Admitting: Gastroenterology

## 2015-01-07 ENCOUNTER — Other Ambulatory Visit: Payer: Self-pay | Admitting: Nurse Practitioner

## 2015-01-07 DIAGNOSIS — N644 Mastodynia: Secondary | ICD-10-CM

## 2015-01-10 ENCOUNTER — Ambulatory Visit
Admission: RE | Admit: 2015-01-10 | Discharge: 2015-01-10 | Disposition: A | Discharge status: Home / Self Care | Payer: Medicare PPO | Source: Ambulatory Visit | Attending: Nurse Practitioner | Admitting: Nurse Practitioner

## 2015-01-10 DIAGNOSIS — N644 Mastodynia: Secondary | ICD-10-CM | POA: Diagnosis not present

## 2015-01-16 DIAGNOSIS — R1084 Generalized abdominal pain: Secondary | ICD-10-CM | POA: Diagnosis not present

## 2015-01-16 DIAGNOSIS — K5900 Constipation, unspecified: Secondary | ICD-10-CM | POA: Diagnosis not present

## 2015-01-16 DIAGNOSIS — R194 Change in bowel habit: Secondary | ICD-10-CM | POA: Diagnosis not present

## 2015-01-16 DIAGNOSIS — R159 Full incontinence of feces: Secondary | ICD-10-CM | POA: Diagnosis not present

## 2015-01-29 DIAGNOSIS — H04123 Dry eye syndrome of bilateral lacrimal glands: Secondary | ICD-10-CM | POA: Diagnosis not present

## 2015-02-10 ENCOUNTER — Ambulatory Visit: Payer: Medicare PPO | Admitting: Gastroenterology

## 2015-02-10 DIAGNOSIS — H01002 Unspecified blepharitis right lower eyelid: Secondary | ICD-10-CM | POA: Diagnosis not present

## 2015-02-10 DIAGNOSIS — H01004 Unspecified blepharitis left upper eyelid: Secondary | ICD-10-CM | POA: Diagnosis not present

## 2015-02-10 DIAGNOSIS — H04123 Dry eye syndrome of bilateral lacrimal glands: Secondary | ICD-10-CM | POA: Diagnosis not present

## 2015-02-10 DIAGNOSIS — H01001 Unspecified blepharitis right upper eyelid: Secondary | ICD-10-CM | POA: Diagnosis not present

## 2015-02-10 DIAGNOSIS — H01005 Unspecified blepharitis left lower eyelid: Secondary | ICD-10-CM | POA: Diagnosis not present

## 2015-03-24 DIAGNOSIS — K5900 Constipation, unspecified: Secondary | ICD-10-CM | POA: Diagnosis not present

## 2015-04-16 ENCOUNTER — Telehealth: Payer: Self-pay | Admitting: Behavioral Health

## 2015-04-16 ENCOUNTER — Encounter: Payer: Self-pay | Admitting: Behavioral Health

## 2015-04-16 NOTE — Addendum Note (Signed)
Addended by: Eduard Roux E on: 04/16/2015 04:19 PM   Modules accepted: Orders, Medications

## 2015-04-16 NOTE — Telephone Encounter (Signed)
Pre-Visit Call completed with patient and chart updated.   Pre-Visit Info documented in Specialty Comments under SnapShot.    

## 2015-04-16 NOTE — Telephone Encounter (Signed)
Patient returned your call about her appt tomorrow

## 2015-04-16 NOTE — Telephone Encounter (Signed)
Unable to reach patient at time of Pre-Visit Call.  Left message for patient to return call when available.    

## 2015-04-17 ENCOUNTER — Encounter: Payer: Self-pay | Admitting: Internal Medicine

## 2015-04-17 ENCOUNTER — Ambulatory Visit (INDEPENDENT_AMBULATORY_CARE_PROVIDER_SITE_OTHER): Payer: Medicare PPO | Admitting: Internal Medicine

## 2015-04-17 VITALS — BP 108/64 | HR 64 | Temp 97.4°F | Ht 65.0 in | Wt 152.4 lb

## 2015-04-17 DIAGNOSIS — I10 Essential (primary) hypertension: Secondary | ICD-10-CM | POA: Diagnosis not present

## 2015-04-17 DIAGNOSIS — Z23 Encounter for immunization: Secondary | ICD-10-CM | POA: Diagnosis not present

## 2015-04-17 DIAGNOSIS — Z0000 Encounter for general adult medical examination without abnormal findings: Secondary | ICD-10-CM

## 2015-04-17 DIAGNOSIS — Z09 Encounter for follow-up examination after completed treatment for conditions other than malignant neoplasm: Secondary | ICD-10-CM | POA: Insufficient documentation

## 2015-04-17 DIAGNOSIS — N189 Chronic kidney disease, unspecified: Secondary | ICD-10-CM

## 2015-04-17 DIAGNOSIS — E785 Hyperlipidemia, unspecified: Secondary | ICD-10-CM | POA: Diagnosis not present

## 2015-04-17 DIAGNOSIS — R109 Unspecified abdominal pain: Secondary | ICD-10-CM

## 2015-04-17 LAB — LIPID PANEL
CHOL/HDL RATIO: 3
CHOLESTEROL: 127 mg/dL (ref 0–200)
HDL: 36.8 mg/dL — ABNORMAL LOW (ref >39.00)
LDL Cholesterol: 52 mg/dL (ref 0–99)
NonHDL: 90.07
TRIGLYCERIDES: 192 mg/dL — AB (ref 0.0–149.0)
VLDL: 38.4 mg/dL (ref 0.0–40.0)

## 2015-04-17 LAB — CBC WITH DIFFERENTIAL/PLATELET
Basophils Absolute: 0 10*3/uL (ref 0.0–0.1)
Basophils Relative: 0.6 % (ref 0.0–3.0)
Eosinophils Absolute: 0.2 10*3/uL (ref 0.0–0.7)
Eosinophils Relative: 4.1 % (ref 0.0–5.0)
HCT: 38 % (ref 36.0–46.0)
Hemoglobin: 12.5 g/dL (ref 12.0–15.0)
Lymphocytes Relative: 22 % (ref 12.0–46.0)
Lymphs Abs: 1.3 10*3/uL (ref 0.7–4.0)
MCHC: 32.9 g/dL (ref 30.0–36.0)
MCV: 95.3 fl (ref 78.0–100.0)
MONOS PCT: 7.5 % (ref 3.0–12.0)
Monocytes Absolute: 0.4 10*3/uL (ref 0.1–1.0)
NEUTROS ABS: 3.9 10*3/uL (ref 1.4–7.7)
Neutrophils Relative %: 65.8 % (ref 43.0–77.0)
PLATELETS: 318 10*3/uL (ref 150.0–400.0)
RBC: 3.99 Mil/uL (ref 3.87–5.11)
RDW: 14.4 % (ref 11.5–15.5)
WBC: 6 10*3/uL (ref 4.0–10.5)

## 2015-04-17 LAB — COMPREHENSIVE METABOLIC PANEL
ALBUMIN: 4.1 g/dL (ref 3.5–5.2)
ALT: 9 U/L (ref 0–35)
AST: 20 U/L (ref 0–37)
Alkaline Phosphatase: 77 U/L (ref 39–117)
BILIRUBIN TOTAL: 0.5 mg/dL (ref 0.2–1.2)
BUN: 18 mg/dL (ref 6–23)
CALCIUM: 9.8 mg/dL (ref 8.4–10.5)
CO2: 25 meq/L (ref 19–32)
CREATININE: 0.98 mg/dL (ref 0.40–1.20)
Chloride: 109 mEq/L (ref 96–112)
GFR: 57.58 mL/min — ABNORMAL LOW (ref >60.00)
Glucose, Bld: 105 mg/dL — ABNORMAL HIGH (ref 70–99)
Potassium: 4.4 mEq/L (ref 3.5–5.1)
Sodium: 141 mEq/L (ref 135–145)
Total Protein: 6.5 g/dL (ref 6.0–8.3)

## 2015-04-17 LAB — URINALYSIS, ROUTINE W REFLEX MICROSCOPIC
Bilirubin Urine: NEGATIVE
HGB URINE DIPSTICK: NEGATIVE
Ketones, ur: NEGATIVE
Leukocytes, UA: NEGATIVE
NITRITE: NEGATIVE
RBC / HPF: NONE SEEN (ref 0–?)
Specific Gravity, Urine: 1.01 (ref 1.000–1.030)
TOTAL PROTEIN, URINE-UPE24: NEGATIVE
Urine Glucose: NEGATIVE
Urobilinogen, UA: 0.2 (ref 0.0–1.0)
WBC, UA: NONE SEEN (ref 0–?)
pH: 6.5 (ref 5.0–8.0)

## 2015-04-17 NOTE — Progress Notes (Signed)
Pre visit review using our clinic review tool, if applicable. No additional management support is needed unless otherwise documented below in the visit note. 

## 2015-04-17 NOTE — Patient Instructions (Signed)
Get your blood work before you leave   Next X visit in 4 months, nonfasting   Fall Prevention and Home Safety Falls cause injuries and can affect all age groups. It is possible to use preventive measures to significantly decrease the likelihood of falls. There are many simple measures which can make your home safer and prevent falls. OUTDOORS  Repair cracks and edges of walkways and driveways.  Remove high doorway thresholds.  Trim shrubbery on the main path into your home.  Have good outside lighting.  Clear walkways of tools, rocks, debris, and clutter.  Check that handrails are not broken and are securely fastened. Both sides of steps should have handrails.  Have leaves, snow, and ice cleared regularly.  Use sand or salt on walkways during winter months.  In the garage, clean up grease or oil spills. BATHROOM  Install night lights.  Install grab bars by the toilet and in the tub and shower.  Use non-skid mats or decals in the tub or shower.  Place a plastic non-slip stool in the shower to sit on, if needed.  Keep floors dry and clean up all water on the floor immediately.  Remove soap buildup in the tub or shower on a regular basis.  Secure bath mats with non-slip, double-sided rug tape.  Remove throw rugs and tripping hazards from the floors. BEDROOMS  Install night lights.  Make sure a bedside light is easy to reach.  Do not use oversized bedding.  Keep a telephone by your bedside.  Have a firm chair with side arms to use for getting dressed.  Remove throw rugs and tripping hazards from the floor. KITCHEN  Keep handles on pots and pans turned toward the center of the stove. Use back burners when possible.  Clean up spills quickly and allow time for drying.  Avoid walking on wet floors.  Avoid hot utensils and knives.  Position shelves so they are not too high or low.  Place commonly used objects within easy reach.  If necessary, use a sturdy  step stool with a grab bar when reaching.  Keep electrical cables out of the way.  Do not use floor polish or wax that makes floors slippery. If you must use wax, use non-skid floor wax.  Remove throw rugs and tripping hazards from the floor. STAIRWAYS  Never leave objects on stairs.  Place handrails on both sides of stairways and use them. Fix any loose handrails. Make sure handrails on both sides of the stairways are as long as the stairs.  Check carpeting to make sure it is firmly attached along stairs. Make repairs to worn or loose carpet promptly.  Avoid placing throw rugs at the top or bottom of stairways, or properly secure the rug with carpet tape to prevent slippage. Get rid of throw rugs, if possible.  Have an electrician put in a light switch at the top and bottom of the stairs. OTHER FALL PREVENTION TIPS  Wear low-heel or rubber-soled shoes that are supportive and fit well. Wear closed toe shoes.  When using a stepladder, make sure it is fully opened and both spreaders are firmly locked. Do not climb a closed stepladder.  Add color or contrast paint or tape to grab bars and handrails in your home. Place contrasting color strips on first and last steps.  Learn and use mobility aids as needed. Install an electrical emergency response system.  Turn on lights to avoid dark areas. Replace light bulbs that burn out immediately.  immediately. Get light switches that glow.  Arrange furniture to create clear pathways. Keep furniture in the same place.  Firmly attach carpet with non-skid or double-sided tape.  Eliminate uneven floor surfaces.  Select a carpet pattern that does not visually hide the edge of steps.  Be aware of all pets. OTHER HOME SAFETY TIPS  Set the water temperature for 120 F (48.8 C).  Keep emergency numbers on or near the telephone.  Keep smoke detectors on every level of the home and near sleeping areas. Document Released: 03/26/2002 Document Revised:  10/05/2011 Document Reviewed: 06/25/2011 ExitCare Patient Information 2015 [employer]. This information is not intended to replace advice given to you by your health care provider. Make sure you discuss any questions you have with your health care provider.   Preventive Care for Adults Ages 65 and over  Blood pressure check.** / Every 1 to 2 years.  Lipid and cholesterol check.**/ Every 5 years beginning at age 20.  Lung cancer screening. / Every year if you are aged 55-80 years and have a 30-pack-year history of smoking and currently smoke or have quit within the past 15 years. Yearly screening is stopped once you have quit smoking for at least 15 years or develop a health problem that would prevent you from having lung cancer treatment.  Fecal occult blood test (FOBT) of stool. / Every year beginning at age 50 and continuing until age 75. You may not have to do this test if you get a colonoscopy every 10 years.  Flexible sigmoidoscopy** or colonoscopy.** / Every 5 years for a flexible sigmoidoscopy or every 10 years for a colonoscopy beginning at age 50 and continuing until age 75.  Hepatitis C blood test.** / For all people born from 1945 through 1965 and any individual with known risks for hepatitis C.  Abdominal aortic aneurysm (AAA) screening.** / A one-time screening for ages 65 to 75 years who are current or former smokers.  Skin self-exam. / Monthly.  Influenza vaccine. / Every year.  Tetanus, diphtheria, and acellular pertussis (Tdap/Td) vaccine.** / 1 dose of Td every 10 years.  Varicella vaccine.** / Consult your health care provider.  Zoster vaccine.** / 1 dose for adults aged 60 years or older.  Pneumococcal 13-valent conjugate (PCV13) vaccine.** / Consult your health care provider.  Pneumococcal polysaccharide (PPSV23) vaccine.** / 1 dose for all adults aged 65 years and older.  Meningococcal vaccine.** / Consult your health care provider.  Hepatitis A  vaccine.** / Consult your health care provider.  Hepatitis B vaccine.** / Consult your health care provider.  Haemophilus influenzae type b (Hib) vaccine.** / Consult your health care provider. **Family history and personal history of risk and conditions may change your health care provider's recommendations. Document Released: 06/01/2001 Document Revised: 04/10/2013 Document Reviewed: 08/31/2010 ExitCare Patient Information 2015 [employer]. This information is not intended to replace advice given to you by your health care provider. Make sure you discuss any questions you have with your health care provider.   

## 2015-04-17 NOTE — Assessment & Plan Note (Signed)
HTN: Seems well-controlled Hyperlipidemia: Continue statins, check FLP Anxiety depression: Controlled at that this time CKD: Solitary kidney, check a BMP, UA to rule out proteinuria. Flank pain: We'll check a renal  ultrasound. Has a atrophic right kidney. DJD: On chronic Celebrex,  reminded her to keep herself hydrated to prevent kidney damage. Fecal incontinence,  per GI RTC 4 months

## 2015-04-17 NOTE — Progress Notes (Signed)
Subjective:    Patient ID: [name], female    DOB: 1929/10/20, 81 y.o.   MRN: [id number]  DOS:  04/17/2015 Type of visit - description : CPX Here for Medicare AWV:  1. Risk factors based on Past M, S, F history: reviewed 2. Physical Activities:  Active w/ home chores, occ takes a walk 3. Depression/mood: neg screening  4. Hearing:  No problems noted or reported  5. ADL's: independent, drives  6. Fall Risk: no recent falls, prevention discussed , see AVS 7. home Safety: does feel safe at home  8. Height, weight, & visual acuity: see VS, sees eye doctor regulalrly 9. Counseling: provided 10. Labs ordered based on risk factors: if needed  11. Referral Coordination: if needed 12. Care Plan, see assessment and plan , written personalized plan provided , see AVS 13. Cognitive Assessment: motor skills and cognition appropriate for age 10. Care team updated  15. End-of-life care discussed , has a HC POA  In addition, today we discussed the following: Palpitations: Currently asymptomatic, good compliance w/ CCBs High cholesterol: Good compliance with Lipitor Complain of pain at the right flank, different in her low back pain, for the last 3 months, usually present every morning, fades away as the day goes by, change with certain positions. She is concerned because she has a nonfunctioning kidney on the right side.   Review of Systems  Constitutional: No fever. No chills. No unexplained wt changes. No unusual sweats  HEENT: No dental problems, no ear discharge, no facial swelling, no voice changes. No eye discharge, no eye  redness , no  intolerance to light   Respiratory: No wheezing , no  difficulty breathing. No cough , no mucus production  Cardiovascular: No CP, no leg swelling , no  Palpitations  GI: no nausea, no vomiting, no diarrhea , continue with constipation, recently saw GI. No blood in the stools. No dysphagia, no odynophagia    Endocrine: No polyphagia, no  polyuria , no polydipsia  GU: No dysuria, gross hematuria, difficulty urinating. No urinary urgency, no frequency.  Musculoskeletal: Chronic pains at baseline, severe in the hands, on Celebrex. Has a new right flank pain. See above  Skin: No change in the color of the skin, palor , no  Rash  Allergic, immunologic: No environmental allergies , no  food allergies  Neurological: No dizziness no  syncope. No headaches. No diplopia, no slurred, no slurred speech, no motor deficits, no facial  Numbness  Hematological: No enlarged lymph nodes, no easy bruising , no unusual bleedings  Psychiatry: No suicidal ideas, no hallucinations, no beavior problems, no confusion.  No unusual/severe anxiety, no depression  Past Medical History  Diagnosis Date  . Hyperlipidemia   . Hypertension   . Anxiety and depression   . Pulmonary nodules     Wert. First seen by CT only 05/04/03, no change on f/u 08/08/06 -Macroscopic changes rml 04/27/07 > resolved april 6,2010 no further w/u   . Knee pain     right chronic  . Back pain     lumbar  . SVT (supraventricular tachycardia) (HCC)     sees Dr.Ross  . Hydronephrosis     Right 2008 s/p urology eval CT/cysto: observation/monitor  . Headache(784.0)   . GI (gastrointestinal bleed)     hx of colon polyps 2004, diverticulosis, internal hemmorhids  . Migraine     on topamax , per neuro  . Seborrheic keratosis     Left inferior posterior flank  .  Fecal incontinence     Dr. Michail Sermon, likely due to loose stools in conjunction with pelvic muscle laxity from aging, recommended to stop Miralax    Past Surgical History  Procedure Laterality Date  . Abdominal hysterectomy    . Rotator cuff repair  9/-2011  . Total knee arthroplasty      right  . Cataract extraction  2008    Dr.Digby  . R ovary removed    . Lumbar reconstructive surgery  01/07/2009  . Appendectomy    . Cholecystectomy    . Tonsillectomy    . Bladder repair    . Shoulder surgery  12-2011     LEFT  . Trigger finger surgery      L thumb (2014), R ring finger 05-2013  . Basal cell carcinoma excision      Social History   Social History  . Marital Status: Widowed    Spouse Name: N/A  . Number of Children: 1  . Years of Education: N/A   Occupational History  . retired     Social History Main Topics  . Smoking status: Never Smoker   . Smokeless tobacco: Never Used  . Alcohol Use: Yes     Comment: rarely  . Drug Use: No  . Sexual Activity: Not on file   Other Topics Concern  . Not on file   Social History Narrative   Moved from a retirement home to a condominium 04-2013, lives by herself , drives    Son lives in [place]     Family History  Problem Relation Age of Onset  . Colon cancer Neg Hx   . Breast cancer Mother     later in life  . Diabetes Mother     late in life  . Heart disease Mother     late onset  . Lung cancer Father     apparently had TB  . Heart disease Father     late onset  . Prostate cancer Father   . Colitis Brother   . Cancer Brother       Medication List       This list is accurate as of: 04/17/15  6:06 PM.  Always use your most recent med list.               aspirin 81 MG tablet  Take 81 mg by mouth daily.     atorvastatin 40 MG tablet  Commonly known as:  LIPITOR  Take 1 tablet (40 mg total) by mouth at bedtime.     azelastine 0.1 % nasal spray  Commonly known as:  ASTELIN  Place 2 sprays into both nostrils 2 (two) times daily.     calcium gluconate 500 MG tablet  Take 500 mg by mouth 2 (two) times daily.     celecoxib 200 MG capsule  Commonly known as:  CELEBREX  Take 200 mg by mouth daily.     COLACE PO  Take by mouth as needed.     cyclobenzaprine 5 MG tablet  Commonly known as:  FLEXERIL  Take 5 mg by mouth.     DETROL PO  Take by mouth.     diltiazem 120 MG 24 hr capsule  Commonly known as:  CARDIZEM CD  Take 1 capsule (120 mg total) by mouth daily.     gabapentin 100 MG capsule  Commonly  known as:  NEURONTIN  Take 200-300 mg by mouth 3 (three) times daily as needed.  LOTEMAX 0.5 % Gel  Generic drug:  Loteprednol Etabonate  Place 1 drop into both eyes as needed.     methocarbamol 500 MG tablet  Commonly known as:  ROBAXIN  Take 500 mg by mouth 3 (three) times daily.     multivitamin per tablet  Take 1 tablet by mouth daily.     NON FORMULARY  2 capsules 2 (two) times daily. HydroEye (pt. States she gets from Dr. Doy Mince EYES     PROBIOTIC DAILY PO  Take by mouth.     SOOTHE 0.6-0.6 % Soln  Generic drug:  Propylene Glycol-Glycerin  Apply to eye as needed.     SYSTANE OP  Apply to eye at bedtime.     topiramate 25 MG capsule  Commonly known as:  TOPAMAX  Take 25 mg by mouth at bedtime.     TYLENOL 8 HOUR PO  Take 500 mg by mouth every 4 (four) hours.     VITAMIN C PO  Take 1 tablet by mouth 2 (two) times daily.           Objective:   Physical Exam BP 108/64 mmHg  Pulse 64  Temp(Src) 97.4 F (36.3 C) (Oral)  Ht 5\' 5"  (1.651 m)  Wt 152 lb 6.4 oz (69.128 kg)  BMI 25.36 kg/m2  SpO2 97% General:   Well developed, well nourished . NAD.  HEENT:  Normocephalic . Face symmetric, atraumatic Lungs:  CTA B Normal respiratory effort, no intercostal retractions, no accessory muscle use. Heart: RRR,  no murmur.  no pretibial edema bilaterally  Abdomen:  Not distended, soft, non-tender. No rebound or rigidity. No CVA tenderness. No mass Skin: Not pale. Not jaundice Neurologic:  alert & oriented X3.  Speech normal, gait appropriate for age and unassisted Psych--  Cognition and judgment appear intact.  Cooperative with normal attention span and concentration.  Behavior appropriate. No anxious or depressed appearing.    Assessment & Plan:   Assessment HTN Hyperlipidemia Anxiety, depression Renal --CKD - solitary functioning L kidney; used to see nephrology --R kidney atrophy , hydronephrosis, non functioning,  sees urology, had w/u  including a CT- cystoscopy performed 2008  MSK:  --On chronic celebrex , needs renal fx and UA (r/o proteinuria) per renal recommendation --DJD, severe @ the hands --On-off back - neck pain on  , flexeril prn   Palpitations,? SVT, saw Dr. Harrington Challenger 2014, on cardiazem Migraines, on Topamax-gabapentin, sees neurology (Dr Domingo Cocking)   Fecal incontinence, GI Dr. Michail Sermon Pulmonary nodules Wert. First seen by CT only 05/04/03, no change on f/u 08/08/06 -Macroscopic changes rml 04/27/07 > resolved april 6,2010 no further w/u  [place]  Sees Dermatology    PLAN HTN: Seems well-controlled Hyperlipidemia: Continue statins, check FLP Anxiety depression: Controlled at that this time CKD: Solitary kidney, check a BMP, UA to rule out proteinuria. Flank pain: We'll check a renal  ultrasound. Has a atrophic right kidney. DJD: On chronic Celebrex,  reminded her to keep herself hydrated to prevent kidney damage. Fecal incontinence,  per GI RTC 4 months

## 2015-04-17 NOTE — Assessment & Plan Note (Signed)
Td 2014; shingles shot 2007 ; pneumonia shot 2004 and 2009 ; prevnar-- today Had a flu shot Colonoscopy w/ Dr Deatra Ina, + polyp 11-04  colonoscopy 04-2008 @ [facility] : Hemorrhoids, diverticuli. No polyps   Pap-- 06/05/14 w/ Dr. Dian Queen at [facility]; normal; pt. reported MMG-- 01/10/15 w/ the [place]; bi-rads category 2-benign   DEXA 2004 and 2009 (normal), ordered one 2014 but not done due to insurance, having a new insurance next year, we'll try again in few months counseled : diet , exercise

## 2015-04-18 ENCOUNTER — Ambulatory Visit (HOSPITAL_BASED_OUTPATIENT_CLINIC_OR_DEPARTMENT_OTHER)
Admission: RE | Admit: 2015-04-18 | Discharge: 2015-04-18 | Disposition: A | Discharge status: Home / Self Care | Payer: Medicare PPO | Source: Ambulatory Visit | Attending: Internal Medicine | Admitting: Internal Medicine

## 2015-04-18 DIAGNOSIS — R109 Unspecified abdominal pain: Secondary | ICD-10-CM | POA: Insufficient documentation

## 2015-04-18 DIAGNOSIS — N1339 Other hydronephrosis: Secondary | ICD-10-CM | POA: Insufficient documentation

## 2015-04-18 DIAGNOSIS — N189 Chronic kidney disease, unspecified: Secondary | ICD-10-CM | POA: Insufficient documentation

## 2015-04-18 DIAGNOSIS — N261 Atrophy of kidney (terminal): Secondary | ICD-10-CM | POA: Insufficient documentation

## 2015-04-22 ENCOUNTER — Telehealth: Payer: Self-pay | Admitting: Internal Medicine

## 2015-04-22 NOTE — Telephone Encounter (Signed)
Caller name: Alliah   Relationship to patient: Self   Can be reached: [phone]  Reason for call: Pt called in to check the status of her labs from the other day. She would like to be called on her cell (phone number provided) when they are in.

## 2015-04-22 NOTE — Telephone Encounter (Signed)
LMOM informing Pt of Dr. Larose Kells recommendations, instructed Pt to call if she has any further questions or concerns.

## 2015-04-22 NOTE — Telephone Encounter (Signed)
Spoke with Pt, informed her of US renal and lab results. Pt verbalized understanding, but wanted to know what she can do for her back pain that she stated she discussed at her office visit last week. Informed her I would send message to Dr. Larose Kells for advice.

## 2015-04-22 NOTE — Telephone Encounter (Signed)
In addition to Celebrex, she can take Tylenol 500 mg 2 tablets 3 times a day as needed. If that is not helping, come back to the office next month

## 2015-04-22 NOTE — Telephone Encounter (Signed)
Notes Recorded by Damita Dunnings, CMA on 04/22/2015 at 9:44 AM Letter printed and mailed to Pt. Notes Recorded by Colon Branch, MD on 04/21/2015 at 10:02 AM Send a letter Mardene Celeste, your cholesterol is under excellent control, other tests are within normal. Good results, continue taking the same medications

## 2015-04-24 ENCOUNTER — Other Ambulatory Visit: Payer: Self-pay | Admitting: Internal Medicine

## 2015-06-06 ENCOUNTER — Telehealth: Payer: Self-pay | Admitting: Internal Medicine

## 2015-06-09 NOTE — Telephone Encounter (Signed)
error:[id number] ° °

## 2015-06-10 ENCOUNTER — Other Ambulatory Visit: Payer: Self-pay | Admitting: Internal Medicine

## 2015-07-03 ENCOUNTER — Other Ambulatory Visit: Payer: Self-pay

## 2015-07-04 ENCOUNTER — Ambulatory Visit (INDEPENDENT_AMBULATORY_CARE_PROVIDER_SITE_OTHER): Payer: Medicare Other | Admitting: Internal Medicine

## 2015-07-04 ENCOUNTER — Ambulatory Visit (HOSPITAL_BASED_OUTPATIENT_CLINIC_OR_DEPARTMENT_OTHER)
Admission: RE | Admit: 2015-07-04 | Discharge: 2015-07-04 | Disposition: A | Discharge status: Home / Self Care | Payer: Medicare Other | Source: Ambulatory Visit | Attending: Internal Medicine | Admitting: Internal Medicine

## 2015-07-04 ENCOUNTER — Encounter: Payer: Self-pay | Admitting: Internal Medicine

## 2015-07-04 VITALS — BP 118/68 | HR 80 | Temp 97.7°F | Ht 65.0 in | Wt 154.0 lb

## 2015-07-04 DIAGNOSIS — M5134 Other intervertebral disc degeneration, thoracic region: Secondary | ICD-10-CM | POA: Insufficient documentation

## 2015-07-04 DIAGNOSIS — I10 Essential (primary) hypertension: Secondary | ICD-10-CM

## 2015-07-04 DIAGNOSIS — M159 Polyosteoarthritis, unspecified: Secondary | ICD-10-CM

## 2015-07-04 DIAGNOSIS — E785 Hyperlipidemia, unspecified: Secondary | ICD-10-CM | POA: Diagnosis not present

## 2015-07-04 DIAGNOSIS — M150 Primary generalized (osteo)arthritis: Secondary | ICD-10-CM | POA: Insufficient documentation

## 2015-07-04 DIAGNOSIS — M810 Age-related osteoporosis without current pathological fracture: Secondary | ICD-10-CM | POA: Insufficient documentation

## 2015-07-04 DIAGNOSIS — Z09 Encounter for follow-up examination after completed treatment for conditions other than malignant neoplasm: Secondary | ICD-10-CM

## 2015-07-04 DIAGNOSIS — Z780 Asymptomatic menopausal state: Secondary | ICD-10-CM | POA: Insufficient documentation

## 2015-07-04 DIAGNOSIS — M8949 Other hypertrophic osteoarthropathy, multiple sites: Secondary | ICD-10-CM

## 2015-07-04 NOTE — Progress Notes (Signed)
Subjective:    Patient ID: [name], female    DOB: Jan 19, 1930, 82 y.o.   MRN: [id number]  DOS:  07/04/2015 Type of visit - description : Routine office visit Interval history:  Her main concern today is pain, continue with pain at the right thoracic spine, no radiation, worse early in the morning when she stands up, worse with moving her torso, no radiation.  Review of Systems  Denies fever chills or weight loss No nausea or vomiting. No dysuria or gross hematuria. Past Medical History  Diagnosis Date  . Hyperlipidemia   . Hypertension   . Anxiety and depression   . Pulmonary nodules     Wert. First seen by CT only 05/04/03, no change on f/u 08/08/06 -Macroscopic changes rml 04/27/07 > resolved april 6,2010 no further w/u   . Knee pain     right chronic  . Back pain     lumbar  . SVT (supraventricular tachycardia) (HCC)     sees Dr.Ross  . Hydronephrosis     Right 2008 s/p urology eval CT/cysto: observation/monitor  . Headache(784.0)   . GI (gastrointestinal bleed)     hx of colon polyps 2004, diverticulosis, internal hemmorhids  . Migraine     on topamax , per neuro  . Seborrheic keratosis     Left inferior posterior flank  . Fecal incontinence     Dr. Michail Sermon, likely due to loose stools in conjunction with pelvic muscle laxity from aging, recommended to stop Miralax    Past Surgical History  Procedure Laterality Date  . Abdominal hysterectomy    . Rotator cuff repair  9/-2011  . Total knee arthroplasty      right  . Cataract extraction  2008    Dr.Digby  . R ovary removed    . Lumbar reconstructive surgery  01/07/2009  . Appendectomy    . Cholecystectomy    . Tonsillectomy    . Bladder repair    . Shoulder surgery  12-2011    LEFT  . Trigger finger surgery      L thumb (2014), R ring finger 05-2013  . Basal cell carcinoma excision      Social History   Social History  . Marital Status: Widowed    Spouse Name: N/A  . Number of Children: 1  .  Years of Education: N/A   Occupational History  . retired     Social History Main Topics  . Smoking status: Never Smoker   . Smokeless tobacco: Never Used  . Alcohol Use: Yes     Comment: rarely  . Drug Use: No  . Sexual Activity: Not on file   Other Topics Concern  . Not on file   Social History Narrative   Moved from a retirement home to a condominium 04-2013, lives by herself , drives    Son lives in [place]        Medication List       This list is accurate as of: 07/04/15 10:48 AM.  Always use your most recent med list.               aspirin 81 MG tablet  Take 81 mg by mouth daily.     atorvastatin 40 MG tablet  Commonly known as:  LIPITOR  Take 1 tablet (40 mg total) by mouth at bedtime.     azelastine 0.1 % nasal spray  Commonly known as:  ASTELIN  Place 2 sprays into both nostrils 2 (  two) times daily.     calcium gluconate 500 MG tablet  Take 500 mg by mouth 2 (two) times daily. Reported on 07/04/2015     celecoxib 200 MG capsule  Commonly known as:  CELEBREX  Take 200 mg by mouth daily.     COLACE PO  Take by mouth as needed.     cyclobenzaprine 5 MG tablet  Commonly known as:  FLEXERIL  Take 5 mg by mouth daily as needed. Reported on 07/04/2015     DETROL PO  Take by mouth.     diltiazem 120 MG 24 hr capsule  Commonly known as:  CARDIZEM CD  Take 1 capsule (120 mg total) by mouth daily.     gabapentin 100 MG capsule  Commonly known as:  NEURONTIN  Take 200-300 mg by mouth 3 (three) times daily as needed.     LOTEMAX 0.5 % Gel  Generic drug:  Loteprednol Etabonate  Place 1 drop into both eyes as needed.     methocarbamol 500 MG tablet  Commonly known as:  ROBAXIN  Take 500 mg by mouth 3 (three) times daily.     multivitamin per tablet  Take 1 tablet by mouth daily.     NON FORMULARY  2 capsules 2 (two) times daily. HydroEye (pt. States she gets from Dr. Doy Mince EYES     PROBIOTIC DAILY PO  Take by mouth.     SOOTHE 0.6-0.6 %  Soln  Generic drug:  Propylene Glycol-Glycerin  Apply to eye as needed.     SYSTANE OP  Apply to eye at bedtime.     topiramate 25 MG capsule  Commonly known as:  TOPAMAX  Take 25 mg by mouth at bedtime. Reported on 07/04/2015     TYLENOL 8 HOUR PO  Take 500 mg by mouth every 4 (four) hours.     VITAMIN C PO  Take 1 tablet by mouth 2 (two) times daily.           Objective:   Physical Exam BP 118/68 mmHg  Pulse 80  Temp(Src) 97.7 F (36.5 C) (Oral)  Ht 5\' 5"  (1.651 m)  Wt 154 lb (69.854 kg)  BMI 25.63 kg/m2  SpO2 95% General:   Well developed, well nourished . NAD.  HEENT:  Normocephalic . Face symmetric, atraumatic Lungs:  CTA B Normal respiratory effort, no intercostal retractions, no accessory muscle use. Heart: RRR,  no murmur.  No pretibial edema bilaterally  MSK: No actual CVA tenderness, she is slightly TTP at the right paraspinal muscles distally. Skin: Not pale. Not jaundice Neurologic:  alert & oriented X3.  Speech normal, gait appropriate for age and unassisted Psych--  Cognition and judgment appear intact.  Cooperative with normal attention span and concentration.  Behavior appropriate. No anxious or depressed appearing.      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 SVT, saw Dr. Harrington Challenger 2014, on cardiazem Migraines, on Topamax-gabapentin, sees neurology (Dr Domingo Cocking)   Fecal incontinence, GI Dr. Michail Sermon Pulmonary nodules Wert. First seen by CT only 05/04/03, no change on f/u  08/08/06 -Macroscopic changes rml  04/27/07 > resolved april 6,2010 no further w/u  [facility]  Sees Dermatology    PLAN Hyperlipidemia: Well controlled, no change. HTN-Last BMP satisfactory Flank pain, thoracic pain: Likely MSK, w/u few months ago >>> UA and [place] were stable/ (-). Recommend a x-ray and see orthopedic surgery. See instructions Also due for a DEXA, see commments from last visit. RTC 6 months

## 2015-07-04 NOTE — Patient Instructions (Signed)
  GO TO THE FRONT DESK Schedule your next appointment for a  Follow up   When?   6 months Fasting?  no    STOP BY THE FIRST FLOOR:  get the XR     For back pain: Continue Celebrex, gabapentin, Robaxin. You can also take Tylenol as needed Please see your orthopedic doctor.

## 2015-07-04 NOTE — Progress Notes (Signed)
Pre visit review using our clinic review tool, if applicable. No additional management support is needed unless otherwise documented below in the visit note. 

## 2015-07-05 NOTE — Assessment & Plan Note (Signed)
Hyperlipidemia: Well controlled, no change. HTN-Last BMP satisfactory Flank pain, thoracic pain: Likely MSK, w/u few months ago >>> UA and [place] were stable/ (-). Recommend a x-ray and see orthopedic surgery. See instructions Also due for a DEXA, see commments from last visit. RTC 6 months

## 2015-07-07 ENCOUNTER — Telehealth: Payer: Self-pay | Admitting: Internal Medicine

## 2015-07-07 MED ORDER — ALENDRONATE SODIUM 70 MG PO TABS: 70.0000 mg | ORAL_TABLET | ORAL | Status: DC

## 2015-07-07 NOTE — Telephone Encounter (Signed)
Spoke w/ Pt, informed her of x-ray results and bone density results. Instructed her to still F/U w/ her Ortho MD. She agreed. Also, instructed her to start Fosamax she will take 1 tablet weekly for osteoporosis as well as calcium and Vitamin D supplements. Informed her how medication should be taken, on an empty stomach w/ full glass of water, and to remain sitting upright for at least 30 minutes. Pt verbalized understanding.

## 2015-07-07 NOTE — Addendum Note (Signed)
Addended byDamita Dunnings D on: 07/07/2015 09:45 AM   Modules accepted: Orders

## 2015-07-07 NOTE — Telephone Encounter (Signed)
Notes Recorded by Damita Dunnings, CMA on 07/07/2015 at 9:47 AM Letter and Rx printed and mailed to Pt. ------  Notes Recorded by Damita Dunnings, CMA on 07/07/2015 at 9:42 AM Tried calling Pt, requested to speak with her, and she disconnected. ------  Notes Recorded by Colon Branch, MD on 07/06/2015 at 11:26 AM Please call the patient: X-ray of the spine was okay. She does have osteoporosis, in addition to daily calcium and vitamin D supplements I recommended Fosamax 70 mg 1 tablet weekly on an empty stomach, with fluids. Please discuss with the patient the rest of the precautions, send a prescription, if needed arrange, visit to discuss other potential treatments.

## 2015-07-07 NOTE — Telephone Encounter (Signed)
Relation to [id number] Call back number: best # home [phone]   Reason for call:  Patient inquiring about bone density and x ray results

## 2015-07-11 NOTE — Telephone Encounter (Signed)
Please advise 

## 2015-07-11 NOTE — Telephone Encounter (Signed)
LMOM informing Pt that okay to take Fosamax per Dr. Larose Kells. Instructed her to call if she has any questions or concerns.

## 2015-07-11 NOTE — Telephone Encounter (Signed)
Fortunately, her kidney function is good. Okay to take Fosamax

## 2015-07-11 NOTE — Telephone Encounter (Addendum)
Patient wanted to ensure alendronate (FOSAMAX) 70 MG tablet will not harm the 1 kidney she has left. Please advise patient directly in the event if theres any additional questions. Best [phone]

## 2015-08-18 ENCOUNTER — Ambulatory Visit: Payer: Medicare PPO | Admitting: Internal Medicine

## 2015-09-01 ENCOUNTER — Other Ambulatory Visit: Payer: Self-pay

## 2015-09-01 DIAGNOSIS — Z1231 Encounter for screening mammogram for malignant neoplasm of breast: Secondary | ICD-10-CM

## 2015-09-10 ENCOUNTER — Encounter: Payer: Self-pay | Admitting: Internal Medicine

## 2015-09-10 ENCOUNTER — Ambulatory Visit (INDEPENDENT_AMBULATORY_CARE_PROVIDER_SITE_OTHER): Payer: Medicare Other | Admitting: Internal Medicine

## 2015-09-10 ENCOUNTER — Inpatient Hospital Stay (HOSPITAL_COMMUNITY)
Admission: EM | Admit: 2015-09-10 | Discharge: 2015-09-13 | DRG: 390 | Disposition: A | Discharge status: Home / Self Care | Payer: Medicare Other | Attending: Internal Medicine | Admitting: Internal Medicine

## 2015-09-10 ENCOUNTER — Encounter (HOSPITAL_COMMUNITY): Payer: Self-pay

## 2015-09-10 ENCOUNTER — Inpatient Hospital Stay (HOSPITAL_COMMUNITY): Payer: Medicare Other

## 2015-09-10 ENCOUNTER — Emergency Department (HOSPITAL_COMMUNITY): Payer: Medicare Other

## 2015-09-10 VITALS — BP 116/72 | HR 60 | Temp 98.0°F | Resp 20 | Wt 140.0 lb

## 2015-09-10 DIAGNOSIS — Z7982 Long term (current) use of aspirin: Secondary | ICD-10-CM | POA: Diagnosis not present

## 2015-09-10 DIAGNOSIS — K56609 Unspecified intestinal obstruction, unspecified as to partial versus complete obstruction: Secondary | ICD-10-CM

## 2015-09-10 DIAGNOSIS — Z85828 Personal history of other malignant neoplasm of skin: Secondary | ICD-10-CM | POA: Diagnosis not present

## 2015-09-10 DIAGNOSIS — Z886 Allergy status to analgesic agent status: Secondary | ICD-10-CM | POA: Diagnosis not present

## 2015-09-10 DIAGNOSIS — F329 Major depressive disorder, single episode, unspecified: Secondary | ICD-10-CM | POA: Diagnosis present

## 2015-09-10 DIAGNOSIS — Z801 Family history of malignant neoplasm of trachea, bronchus and lung: Secondary | ICD-10-CM

## 2015-09-10 DIAGNOSIS — Z90710 Acquired absence of both cervix and uterus: Secondary | ICD-10-CM | POA: Diagnosis not present

## 2015-09-10 DIAGNOSIS — K56600 Partial intestinal obstruction, unspecified as to cause: Secondary | ICD-10-CM

## 2015-09-10 DIAGNOSIS — Z833 Family history of diabetes mellitus: Secondary | ICD-10-CM

## 2015-09-10 DIAGNOSIS — K5669 Other intestinal obstruction: Secondary | ICD-10-CM | POA: Diagnosis not present

## 2015-09-10 DIAGNOSIS — D72829 Elevated white blood cell count, unspecified: Secondary | ICD-10-CM | POA: Diagnosis present

## 2015-09-10 DIAGNOSIS — Z8249 Family history of ischemic heart disease and other diseases of the circulatory system: Secondary | ICD-10-CM

## 2015-09-10 DIAGNOSIS — Z803 Family history of malignant neoplasm of breast: Secondary | ICD-10-CM

## 2015-09-10 DIAGNOSIS — R109 Unspecified abdominal pain: Secondary | ICD-10-CM | POA: Insufficient documentation

## 2015-09-10 DIAGNOSIS — R918 Other nonspecific abnormal finding of lung field: Secondary | ICD-10-CM | POA: Diagnosis not present

## 2015-09-10 DIAGNOSIS — Z79899 Other long term (current) drug therapy: Secondary | ICD-10-CM | POA: Diagnosis not present

## 2015-09-10 DIAGNOSIS — Z888 Allergy status to other drugs, medicaments and biological substances status: Secondary | ICD-10-CM | POA: Diagnosis not present

## 2015-09-10 DIAGNOSIS — F411 Generalized anxiety disorder: Secondary | ICD-10-CM

## 2015-09-10 DIAGNOSIS — Z885 Allergy status to narcotic agent status: Secondary | ICD-10-CM

## 2015-09-10 DIAGNOSIS — Z86010 Personal history of colonic polyps: Secondary | ICD-10-CM | POA: Diagnosis not present

## 2015-09-10 DIAGNOSIS — I10 Essential (primary) hypertension: Secondary | ICD-10-CM | POA: Diagnosis not present

## 2015-09-10 DIAGNOSIS — F419 Anxiety disorder, unspecified: Secondary | ICD-10-CM | POA: Diagnosis present

## 2015-09-10 DIAGNOSIS — E785 Hyperlipidemia, unspecified: Secondary | ICD-10-CM | POA: Diagnosis present

## 2015-09-10 DIAGNOSIS — Z0189 Encounter for other specified special examinations: Secondary | ICD-10-CM

## 2015-09-10 DIAGNOSIS — M1990 Unspecified osteoarthritis, unspecified site: Secondary | ICD-10-CM | POA: Diagnosis present

## 2015-09-10 DIAGNOSIS — Z96651 Presence of right artificial knee joint: Secondary | ICD-10-CM | POA: Diagnosis present

## 2015-09-10 LAB — CBC
HEMATOCRIT: 40.1 % (ref 36.0–46.0)
Hemoglobin: 13.1 g/dL (ref 12.0–15.0)
MCH: 31.6 pg (ref 26.0–34.0)
MCHC: 32.7 g/dL (ref 30.0–36.0)
MCV: 96.6 fL (ref 78.0–100.0)
PLATELETS: 268 10*3/uL (ref 150–400)
RBC: 4.15 MIL/uL (ref 3.87–5.11)
RDW: 13.7 % (ref 11.5–15.5)
WBC: 12.1 10*3/uL — AB (ref 4.0–10.5)

## 2015-09-10 LAB — COMPREHENSIVE METABOLIC PANEL
ALT: 10 U/L — ABNORMAL LOW (ref 14–54)
AST: 18 U/L (ref 15–41)
Albumin: 4.1 g/dL (ref 3.5–5.0)
Alkaline Phosphatase: 101 U/L (ref 38–126)
Anion gap: 8 (ref 5–15)
BILIRUBIN TOTAL: 0.9 mg/dL (ref 0.3–1.2)
BUN: 16 mg/dL (ref 6–20)
CO2: 24 mmol/L (ref 22–32)
CREATININE: 0.92 mg/dL (ref 0.44–1.00)
Calcium: 9.2 mg/dL (ref 8.9–10.3)
Chloride: 104 mmol/L (ref 101–111)
GFR calc Af Amer: >60 mL/min (ref >60)
GFR, EST NON AFRICAN AMERICAN: 56 mL/min — AB (ref >60)
Glucose, Bld: 117 mg/dL — ABNORMAL HIGH (ref 65–99)
POTASSIUM: 4.1 mmol/L (ref 3.5–5.1)
Sodium: 136 mmol/L (ref 135–145)
TOTAL PROTEIN: 7.3 g/dL (ref 6.5–8.1)

## 2015-09-10 LAB — URINALYSIS, ROUTINE W REFLEX MICROSCOPIC
Bilirubin Urine: NEGATIVE
Glucose, UA: NEGATIVE mg/dL
Hgb urine dipstick: NEGATIVE
KETONES UR: NEGATIVE mg/dL
NITRITE: NEGATIVE
PROTEIN: NEGATIVE mg/dL
Specific Gravity, Urine: 1.024 (ref 1.005–1.030)
pH: 5.5 (ref 5.0–8.0)

## 2015-09-10 LAB — LIPASE, BLOOD: Lipase: 18 U/L (ref 11–51)

## 2015-09-10 LAB — URINE MICROSCOPIC-ADD ON

## 2015-09-10 LAB — I-STAT CG4 LACTIC ACID, ED
LACTIC ACID, VENOUS: 1.55 mmol/L (ref 0.5–2.0)
Lactic Acid, Venous: 1.6 mmol/L (ref 0.5–2.0)

## 2015-09-10 MED ORDER — ONDANSETRON HCL 4 MG PO TABS: 4.0000 mg | ORAL_TABLET | Freq: Four times a day (QID) | ORAL | Status: DC | PRN

## 2015-09-10 MED ORDER — RISAQUAD PO CAPS
1.0000 | ORAL_CAPSULE | Freq: Every day | ORAL | Status: DC
  Administered 2015-09-12 – 2015-09-13 (×2): 1 via ORAL
  Filled 2015-09-10 (×3): qty 1

## 2015-09-10 MED ORDER — DILTIAZEM HCL ER COATED BEADS 120 MG PO CP24
120.0000 mg | ORAL_CAPSULE | Freq: Every day | ORAL | Status: DC
Start: 2015-09-11 — End: 2015-09-13
  Administered 2015-09-12 – 2015-09-13 (×2): 120 mg via ORAL
  Filled 2015-09-10 (×3): qty 1

## 2015-09-10 MED ORDER — DIATRIZOATE MEGLUMINE & SODIUM 66-10 % PO SOLN
90.0000 mL | Freq: Once | ORAL | Status: AC
  Administered 2015-09-11: 90 mL via NASOGASTRIC
  Filled 2015-09-10: qty 90

## 2015-09-10 MED ORDER — HYDRALAZINE HCL 20 MG/ML IJ SOLN: 10.0000 mg | INTRAMUSCULAR | Status: DC | PRN

## 2015-09-10 MED ORDER — ONDANSETRON HCL 4 MG/2ML IJ SOLN: 4.0000 mg | Freq: Four times a day (QID) | INTRAMUSCULAR | Status: DC | PRN

## 2015-09-10 MED ORDER — IOPAMIDOL (ISOVUE-300) INJECTION 61%
100.0000 mL | Freq: Once | INTRAVENOUS | Status: AC | PRN
  Administered 2015-09-10: 100 mL via INTRAVENOUS

## 2015-09-10 MED ORDER — ASPIRIN EC 81 MG PO TBEC
81.0000 mg | DELAYED_RELEASE_TABLET | Freq: Every day | ORAL | Status: DC
  Administered 2015-09-12: 81 mg via ORAL
  Filled 2015-09-10 (×4): qty 1

## 2015-09-10 MED ORDER — HYDROMORPHONE HCL 1 MG/ML IJ SOLN
0.5000 mg | INTRAMUSCULAR | Status: DC | PRN
  Administered 2015-09-11 – 2015-09-13 (×5): 0.5 mg via INTRAVENOUS
  Filled 2015-09-10 (×5): qty 1

## 2015-09-10 MED ORDER — DIATRIZOATE MEGLUMINE & SODIUM 66-10 % PO SOLN: 15.0000 mL | ORAL | Status: DC | PRN

## 2015-09-10 MED ORDER — HEPARIN SODIUM (PORCINE) 5000 UNIT/ML IJ SOLN
5000.0000 [IU] | Freq: Three times a day (TID) | INTRAMUSCULAR | Status: DC
  Administered 2015-09-11 – 2015-09-13 (×7): 5000 [IU] via SUBCUTANEOUS
  Filled 2015-09-10 (×11): qty 1

## 2015-09-10 MED ORDER — ACETAMINOPHEN 650 MG RE SUPP: 650.0000 mg | Freq: Four times a day (QID) | RECTAL | Status: DC | PRN

## 2015-09-10 MED ORDER — ADULT MULTIVITAMIN W/MINERALS CH
1.0000 | ORAL_TABLET | Freq: Every day | ORAL | Status: DC
Start: 2015-09-11 — End: 2015-09-13
  Administered 2015-09-12 – 2015-09-13 (×2): 1 via ORAL
  Filled 2015-09-10 (×3): qty 1

## 2015-09-10 MED ORDER — ACETAMINOPHEN 325 MG PO TABS
650.0000 mg | ORAL_TABLET | Freq: Four times a day (QID) | ORAL | Status: DC | PRN
  Administered 2015-09-13: 650 mg via ORAL
  Filled 2015-09-10: qty 2

## 2015-09-10 MED ORDER — ATORVASTATIN CALCIUM 40 MG PO TABS
40.0000 mg | ORAL_TABLET | Freq: Every day | ORAL | Status: DC
  Administered 2015-09-12: 40 mg via ORAL
  Filled 2015-09-10 (×4): qty 1

## 2015-09-10 MED ORDER — SODIUM CHLORIDE 0.9 % IV SOLN
INTRAVENOUS | Status: AC
  Administered 2015-09-11 (×2): via INTRAVENOUS

## 2015-09-10 MED ORDER — ZOLPIDEM TARTRATE 5 MG PO TABS
5.0000 mg | ORAL_TABLET | Freq: Every evening | ORAL | Status: DC | PRN
  Filled 2015-09-10: qty 1

## 2015-09-10 MED ORDER — ONDANSETRON HCL 4 MG/2ML IJ SOLN
4.0000 mg | Freq: Once | INTRAMUSCULAR | Status: AC
  Administered 2015-09-10: 4 mg via INTRAVENOUS
  Filled 2015-09-10: qty 2

## 2015-09-10 MED ORDER — FAMOTIDINE IN NACL 20-0.9 MG/50ML-% IV SOLN
20.0000 mg | Freq: Every day | INTRAVENOUS | Status: DC
  Administered 2015-09-11 – 2015-09-12 (×3): 20 mg via INTRAVENOUS
  Filled 2015-09-10 (×3): qty 50

## 2015-09-10 MED ORDER — MORPHINE SULFATE (PF) 4 MG/ML IV SOLN
4.0000 mg | Freq: Once | INTRAVENOUS | Status: AC
  Administered 2015-09-10: 4 mg via INTRAVENOUS
  Filled 2015-09-10: qty 1

## 2015-09-10 NOTE — Progress Notes (Signed)
Pre visit review using our clinic review tool, if applicable. No additional management support is needed unless otherwise documented below in the visit note. 

## 2015-09-10 NOTE — Patient Instructions (Signed)
Please go to ER now for evaluation  Please continue all other medications as before  Please have the pharmacy call with any other refills you may need.  Please keep your appointments with your specialists as you may have planned

## 2015-09-10 NOTE — ED Provider Notes (Signed)
  Face-to-face evaluation   History: She complains of pain and swelling in the abdomen and yesterday. She's been bothered by constipation, and has been using various laxative and stool softeners. Today she had multiple bowel movements.  Physical exam: Alert, calm, cooperative. Abdomen is distended. Abdomen is soft and nontender to palpation.  Note- 22:00- patient is drinking fluids and eating crackers, which were not ordered or recommended by her emergency department providers. I asked her to stop eating and drinking.  Medical screening examination/treatment/procedure(s) were conducted as a shared visit with non-physician practitioner(s) and myself.  I personally evaluated the patient during the encounter  Daleen Bo, MD 09/11/15 [phone]

## 2015-09-10 NOTE — ED Provider Notes (Signed)
CSN: [id number]     Arrival date & time 09/10/15  1807 History   First MD Initiated Contact with Patient 09/10/15 1901     Chief Complaint  Patient presents with  . Abdominal Pain  . Diarrhea  . Emesis  . abdominal distention      (Consider location/radiation/quality/duration/timing/severity/associated sxs/prior Treatment) HPI  Blood pressure 132/56, pulse 89, temperature 98.2 F (36.8 C), temperature source Oral, resp. rate 16, SpO2 97 %.  [name] is a 82 y.o. female complaining of onset of abdominal pain with abdominal distention after eating lunch yesterday. States that the pain was severe throughout the night, kept her up from sleep she developed nonbloody, nonbilious, non-coffee ground emesis this afternoon and reports multiple episodes of nonbloody, non-melanotic diarrhea. She denies fever, chills, change in urination. She states that she is passing flatus but less so than normal.Has history of abdominal hysterectomy and they also took her appendix out, she also had lysis of adhesions with a ureteral injury.  Past Medical History  Diagnosis Date  . Hyperlipidemia   . Hypertension   . Anxiety and depression   . Pulmonary nodules     Wert. First seen by CT only 05/04/03, no change on f/u 08/08/06 -Macroscopic changes rml 04/27/07 > resolved april 6,2010 no further w/u   . Knee pain     right chronic  . Back pain     lumbar  . SVT (supraventricular tachycardia) (HCC)     sees Dr.Ross  . Hydronephrosis     Right 2008 s/p urology eval CT/cysto: observation/monitor  . Headache(784.0)   . GI (gastrointestinal bleed)     hx of colon polyps 2004, diverticulosis, internal hemmorhids  . Migraine     on topamax , per neuro  . Seborrheic keratosis     Left inferior posterior flank  . Fecal incontinence     Dr. Michail Sermon, likely due to loose stools in conjunction with pelvic muscle laxity from aging, recommended to stop Miralax   Past Surgical History  Procedure Laterality  Date  . Abdominal hysterectomy    . Rotator cuff repair  9/-2011  . Total knee arthroplasty      right  . Cataract extraction  2008    Dr.Digby  . R ovary removed    . Lumbar reconstructive surgery  01/07/2009  . Appendectomy    . Cholecystectomy    . Tonsillectomy    . Bladder repair    . Shoulder surgery  12-2011    LEFT  . Trigger finger surgery      L thumb (2014), R ring finger 05-2013  . Basal cell carcinoma excision     Family History  Problem Relation Age of Onset  . Colon cancer Neg Hx   . Breast cancer Mother     later in life  . Diabetes Mother     late in life  . Heart disease Mother     late onset  . Lung cancer Father     apparently had TB  . Heart disease Father     late onset  . Prostate cancer Father   . Colitis Brother   . Cancer Brother    Social History  Substance Use Topics  . Smoking status: Never Smoker   . Smokeless tobacco: Never Used  . Alcohol Use: Yes     Comment: rarely   OB History    No data available     Review of Systems  10 systems reviewed and found  to be negative, except as noted in the HPI.  Allergies  Amitriptyline hcl; Carisoprodol; Codeine; Meloxicam; Metronidazole; Nortriptyline hcl; Oxaprozin; Promethazine hcl; Rofecoxib; Sumatriptan; and Tramadol  Home Medications   Prior to Admission medications   Medication Sig Start Date End Date Taking? Authorizing Provider  acetaminophen (TYLENOL) 500 MG tablet Take 500 mg by mouth every 6 (six) hours as needed for moderate pain.   Yes Historical Provider, MD  Ascorbic Acid (VITAMIN C PO) Take 1 tablet by mouth 2 (two) times daily.    Yes Historical Provider, MD  aspirin 81 MG tablet Take 81 mg by mouth at bedtime.    Yes Historical Provider, MD  atorvastatin (LIPITOR) 40 MG tablet Take 1 tablet (40 mg total) by mouth at bedtime. 06/10/15  Yes Colon Branch, MD  celecoxib (CELEBREX) 200 MG capsule Take 200 mg by mouth daily.   Yes Historical Provider, MD  desoximetasone (TOPICORT)  [id number] % cream 1 APPLICATION APPLY ON THE SKIN TWICE A DAY APPLY TO RASH ON HANDS TWICE DAILY AS NEEDED FOR RASH 09/02/15  Yes Historical Provider, MD  diltiazem (CARDIZEM CD) 120 MG 24 hr capsule Take 1 capsule (120 mg total) by mouth daily. 04/24/15  Yes Colon Branch, MD  gabapentin (NEURONTIN) 100 MG capsule Take 200-300 mg by mouth 3 (three) times daily as needed (pain).    Yes Historical Provider, MD  multivitamin [facility]) per tablet Take 1 tablet by mouth daily.     Yes Historical Provider, MD  NON FORMULARY Take 2 capsules by mouth 2 (two) times daily. Hydro Eye   Yes Historical Provider, MD  Probiotic Product (PROBIOTIC DAILY PO) Take 1 capsule by mouth daily.    Yes Historical Provider, MD  pseudoephedrine-acetaminophen (TYLENOL SINUS) 30-500 MG TABS tablet Take 1 tablet by mouth every 4 (four) hours as needed (sinus congestion).   Yes Historical Provider, MD  tamsulosin (FLOMAX) 0.4 MG CAPS capsule Take 0.4 mg by mouth once a week.   Yes Historical Provider, MD  tolterodine (DETROL LA) 4 MG 24 hr capsule Take 4 mg by mouth daily. 08/27/15  Yes Historical Provider, MD  alendronate (FOSAMAX) 70 MG tablet Take 1 tablet (70 mg total) by mouth every 7 (seven) days. Take with a full glass of water on an empty stomach. 07/07/15   Colon Branch, MD  azelastine (ASTELIN) 0.1 % nasal spray Place 2 sprays into both nostrils 2 (two) times daily. 08/17/13   Colon Branch, MD  calcium gluconate 500 MG tablet Take 500 mg by mouth 2 (two) times daily. Reported on 07/04/2015    Historical Provider, MD   BP 119/67 mmHg  Pulse 81  Temp(Src) 98.2 F (36.8 C) (Oral)  Resp 15  SpO2 94% Physical Exam  Constitutional: She is oriented to person, place, and time. She appears well-developed and well-nourished. No distress.  HENT:  Head: Normocephalic.  Mouth/Throat: Oropharynx is clear and moist.  Eyes: Conjunctivae and EOM are normal.  Neck: Normal range of motion.  Cardiovascular: Normal rate, regular rhythm and intact  distal pulses.   Pulmonary/Chest: Effort normal. No stridor.  Abdominal: Soft. She exhibits distension. She exhibits no mass. There is tenderness. There is no rebound and no guarding.  Severely distended abdomen with some normoactive and some tinkling bowel sounds.  Musculoskeletal: Normal range of motion.  Neurological: She is alert and oriented to person, place, and time.  Psychiatric: She has a normal mood and affect.  Nursing note and vitals reviewed.  ED Course  Procedures (including critical care time) Labs Review Labs Reviewed  COMPREHENSIVE METABOLIC PANEL - Abnormal; Notable for the following:    Glucose, Bld 117 (*)    ALT 10 (*)    GFR calc non Af Amer 56 (*)    All other components within normal limits  CBC - Abnormal; Notable for the following:    WBC 12.1 (*)    All other components within normal limits  URINALYSIS, ROUTINE W REFLEX MICROSCOPIC (NOT AT [facility]) - Abnormal; Notable for the following:    APPearance CLOUDY (*)    Leukocytes, UA SMALL (*)    All other components within normal limits  URINE MICROSCOPIC-ADD ON - Abnormal; Notable for the following:    Squamous Epithelial / LPF 0-5 (*)    Bacteria, UA FEW (*)    Casts HYALINE CASTS (*)    All other components within normal limits  LIPASE, BLOOD  I-STAT CG4 LACTIC ACID, ED  I-STAT CG4 LACTIC ACID, ED    Imaging Review Ct Abdomen Pelvis W Contrast  09/10/2015  CLINICAL DATA:  Abdominal distention with generalized abdominal pain, nausea, vomiting and diarrhea since yesterday. EXAM: CT ABDOMEN AND PELVIS WITH CONTRAST TECHNIQUE: Multidetector CT imaging of the abdomen and pelvis was performed using the standard protocol following bolus administration of intravenous contrast. CONTRAST:  176mL ISOVUE-300 IOPAMIDOL (ISOVUE-300) INJECTION 61% COMPARISON:  08/08/2006 FINDINGS: Lung bases demonstrate several bilateral pulmonary nodules with slight overall increase in size compared to the pre exams and some which are  new. The largest nodule is over the right middle lobe measuring 6 mm. Minimal focal scarring over the right middle lobe. No effusion. Abdominal images demonstrate evidence previous cholecystectomy. There are a few sub cm liver hypodensities too small to characterize but likely cysts. Minimal prominence of the central intrahepatic ducts likely due to patient's previous cholecystectomy. Common bile duct is within normal. The spleen, pancreas and adrenal glands are within normal. Left kidney is normal. Atrophic multi cystic right kidney unchanged. Ureters are within normal. Minimal calcified plaque of the abdominal aorta. Evidence of previous appendectomy. The stomach is within normal. Air and stool present throughout the colon. There is moderate diverticulosis of the sigmoid colon without active inflammation. There are multiple dilated small bowel loops measuring up to 3.9 cm in diameter. Definite transition point is identified. There is no significant free fluid focal inflammatory change. No evidence of free peritoneal air. Pelvic images demonstrate the bladder and rectum to be within normal. Surgical absence of the uterus. There is fusion hardware over the lumbar spine. Degenerative change of the spine hips. IMPRESSION: Multiple dilated small bowel loops measuring up to 3.9 cm in diameter with air in stool throughout the colon as findings likely represent early/partial small bowel obstruction. No definite transition point as etiology may be adhesions. Colonic diverticulosis without active inflammation. Multiple pulmonary nodules the lung bases with slight interval progression. Largest nodule measures 6 mm over the right middle lobe. Recommend chest CT for complete evaluation of the lungs. Several small liver hypodensities likely cysts or hemangiomas. Chronic atrophic polycystic right kidney. Electronically Signed   By: Marin Olp M.D.   On: 09/10/2015 22:03   I have personally reviewed and evaluated these  images and lab results as part of my medical decision-making.   EKG Interpretation None      MDM   Final diagnoses:  Partial small bowel obstruction ([place])  Pulmonary nodules/lesions, multiple    Filed Vitals:   09/10/15 1823 09/10/15 2149  BP: 132/56 119/67  Pulse: 89 81  Temp: 98.2 F (36.8 C)   TempSrc: Oral   Resp: 16 15  SpO2: 97% 94%    Medications  diatrizoate meglumine-sodium (GASTROGRAFIN) 66-10 % solution 15 mL (not administered)  diatrizoate meglumine-sodium (GASTROGRAFIN) 66-10 % solution 90 mL (not administered)  morphine 4 MG/ML injection 4 mg (4 mg Intravenous Given 09/10/15 2002)  ondansetron (ZOFRAN) injection 4 mg (4 mg Intravenous Given 09/10/15 2002)  iopamidol (ISOVUE-300) 61 % injection 100 mL (100 mLs Intravenous Contrast Given 09/10/15 2128)    [name] is 82 y.o. female presenting with Abdominal distention, single episode of emesis with multiple episodes of diarrhea. She has prior lysis of adhesions. Patient with no peritoneal sounds but she is severely uncomfortable  Blood work reassuring with a mild leukocytosis and a normal lactic acid. Patient is improved after morphine.   Unfortunately, this patient received broth and crackers from a technician who was not aware that she was made nothing by mouth.  CT shows multiple small bowel loops dilated up to 4 cm.   Case discussed with general surgery Dr. Donne Hazel to have advised that we initiate DS by mouth protocol and that she have a medical admission, they will follow her in the morning. Of note, there are multiple pulmonary lung nodules in the lung bases with slight interval progression, they suggest a chest CT for further evaluation of the lungs, I feel this can be done on the floor.  Discussed case with Dr. Myna Hidalgo who accepts admission.     Monico Blitz, PA-C 09/10/15 2249  Daleen Bo, MD 09/11/15 [phone]

## 2015-09-10 NOTE — Assessment & Plan Note (Signed)
Etiology unclear, diff includes psbo, acute gastroenteritis, acute diverticulitis, or evven c diff, will need labs, and likely CT abd/pelvis for further evaluation, pt referred to [facility] ER

## 2015-09-10 NOTE — H&P (Signed)
History and Physical    JALITZA RAIMONDI [id number] DOB: Jun 14, 1929 DOA: 09/10/2015  PCP: Kathlene November, MD   Patient coming from: Home   Chief Complaint: Abdominal pain, distension, N/V/D  HPI: ELPIDA MONGAR is a 82 y.o. female with medical history significant for hypertension, hyperlipidemia, osteoarthritis, and pulmonary nodules who presents to the ED with 1 day of generalized abdominal pain with distention and nausea, vomiting, and loose stools. Patient reports pain in her usual state of health until yesterday afternoon when she experienced acute onset of abdominal pain with nausea and vomiting. Symptoms have continued to progress since that time and abdominal distention has developed. She reports passing several loose stools since onset of symptoms, with most recent several hours ago. She continues to pass flatus. She denies any hematemesis, melena, or hematochezia. She has experienced similar symptoms previously which necessitated adhesiolysis approximately 10 years ago. She has remote history of abdominal hysterectomy, appendectomy, and cholecystectomy. She denies any fevers, chills, chest pain, or palpitations. She denies any dysuria or increased urinary urgency or frequency.  ED Course: Upon arrival to the ED, patient is found to be afebrile, saturating adequately on room air, and with vital signs stable. CMP is unremarkable and CBC features a leukocytosis to 12,100. Lactic acid is reassuring at 1.55. CT of the abdomen and pelvis was obtained with demonstration of dilated small bowel loops, likely representing an early/partial SBO without any definitive transition point. Incidental notation is made of slight interval growth and multiple pulmonary nodules. NG tube was placed for decompression. Symptomatic care was provided with IV Zosyn and morphine. General surgery was consulted by the EDP and recommended admission to the medicine service with SBO protocol. Patient has remained  hemodynamically stable in the ED and will be admitted for ongoing evaluation and management of suspected early/partial SBO.  Review of Systems:  All other systems reviewed and apart from HPI, are negative.  Past Medical History  Diagnosis Date  . Hyperlipidemia   . Hypertension   . Anxiety and depression   . Pulmonary nodules     Wert. First seen by CT only 05/04/03, no change on f/u 08/08/06 -Macroscopic changes rml 04/27/07 > resolved april 6,2010 no further w/u   . Knee pain     right chronic  . Back pain     lumbar  . SVT (supraventricular tachycardia) (HCC)     sees Dr.Ross  . Hydronephrosis     Right 2008 s/p urology eval CT/cysto: observation/monitor  . Headache(784.0)   . GI (gastrointestinal bleed)     hx of colon polyps 2004, diverticulosis, internal hemmorhids  . Migraine     on topamax , per neuro  . Seborrheic keratosis     Left inferior posterior flank  . Fecal incontinence     Dr. Michail Sermon, likely due to loose stools in conjunction with pelvic muscle laxity from aging, recommended to stop Miralax    Past Surgical History  Procedure Laterality Date  . Abdominal hysterectomy    . Rotator cuff repair  9/-2011  . Total knee arthroplasty      right  . Cataract extraction  2008    Dr.Digby  . R ovary removed    . Lumbar reconstructive surgery  01/07/2009  . Appendectomy    . Cholecystectomy    . Tonsillectomy    . Bladder repair    . Shoulder surgery  12-2011    LEFT  . Trigger finger surgery      L thumb (2014), R  ring finger 05-2013  . Basal cell carcinoma excision       reports that she has never smoked. She has never used smokeless tobacco. She reports that she drinks alcohol. She reports that she does not use illicit drugs.  Allergies  Allergen Reactions  . Amitriptyline Hcl     REACTION: hallucinations  . Carisoprodol     REACTION: nightmares,rash,tachycardia  . Codeine     REACTION: "deathly ill" nauesa, dizzy,weak  . Meloxicam     REACTION:  sensitive to sun; facial redness; may cause severe abd pain  . Metronidazole     REACTION: abd pain  . Nortriptyline Hcl     REACTION: hallucinations  . Oxaprozin     REACTION: abd pain  . Promethazine Hcl     REACTION: spastic arms and legs flailing while awake and unusual behavior while sleep - walking all over house during night  . Rofecoxib     REACTION: rash  . Sumatriptan     REACTION: rash  . Tramadol Other (See Comments)    Dizziness and constipation    Family History  Problem Relation Age of Onset  . Colon cancer Neg Hx   . Breast cancer Mother     later in life  . Diabetes Mother     late in life  . Heart disease Mother     late onset  . Lung cancer Father     apparently had TB  . Heart disease Father     late onset  . Prostate cancer Father   . Colitis Brother   . Cancer Brother      Prior to Admission medications   Medication Sig Start Date End Date Taking? Authorizing Provider  acetaminophen (TYLENOL) 500 MG tablet Take 500 mg by mouth every 6 (six) hours as needed for moderate pain.   Yes Historical Provider, MD  Ascorbic Acid (VITAMIN C PO) Take 1 tablet by mouth 2 (two) times daily.    Yes Historical Provider, MD  aspirin 81 MG tablet Take 81 mg by mouth at bedtime.    Yes Historical Provider, MD  atorvastatin (LIPITOR) 40 MG tablet Take 1 tablet (40 mg total) by mouth at bedtime. 06/10/15  Yes Colon Branch, MD  celecoxib (CELEBREX) 200 MG capsule Take 200 mg by mouth daily.   Yes Historical Provider, MD  desoximetasone (TOPICORT) [id number] % cream 1 APPLICATION APPLY ON THE SKIN TWICE A DAY APPLY TO RASH ON HANDS TWICE DAILY AS NEEDED FOR RASH 09/02/15  Yes Historical Provider, MD  diltiazem (CARDIZEM CD) 120 MG 24 hr capsule Take 1 capsule (120 mg total) by mouth daily. 04/24/15  Yes Colon Branch, MD  gabapentin (NEURONTIN) 100 MG capsule Take 200-300 mg by mouth 3 (three) times daily as needed (pain).    Yes Historical Provider, MD  multivitamin [facility]) per  tablet Take 1 tablet by mouth daily.     Yes Historical Provider, MD  NON FORMULARY Take 2 capsules by mouth 2 (two) times daily. Hydro Eye   Yes Historical Provider, MD  Probiotic Product (PROBIOTIC DAILY PO) Take 1 capsule by mouth daily.    Yes Historical Provider, MD  pseudoephedrine-acetaminophen (TYLENOL SINUS) 30-500 MG TABS tablet Take 1 tablet by mouth every 4 (four) hours as needed (sinus congestion).   Yes Historical Provider, MD  tamsulosin (FLOMAX) 0.4 MG CAPS capsule Take 0.4 mg by mouth once a week.   Yes Historical Provider, MD  tolterodine (DETROL LA) 4 MG 24 hr  capsule Take 4 mg by mouth daily. 08/27/15  Yes Historical Provider, MD  alendronate (FOSAMAX) 70 MG tablet Take 1 tablet (70 mg total) by mouth every 7 (seven) days. Take with a full glass of water on an empty stomach. 07/07/15   Colon Branch, MD  azelastine (ASTELIN) 0.1 % nasal spray Place 2 sprays into both nostrils 2 (two) times daily. 08/17/13   Colon Branch, MD  calcium gluconate 500 MG tablet Take 500 mg by mouth 2 (two) times daily. Reported on 07/04/2015    Historical Provider, MD    Physical Exam: Filed Vitals:   09/10/15 1823 09/10/15 2149  BP: 132/56 119/67  Pulse: 89 81  Temp: 98.2 F (36.8 C)   TempSrc: Oral   Resp: 16 15  SpO2: 97% 94%      Constitutional: NAD, calm, comfortable Eyes: PERTLA, lids and conjunctivae normal ENMT: Mucous membranes are moist. Posterior pharynx clear of any exudate or lesions.   Neck: normal, supple, no masses, no thyromegaly Respiratory: clear to auscultation bilaterally, no wheezing, no crackles. Normal respiratory effort. No accessory muscle use.  Cardiovascular: S1 & S2 heard, regular rate and rhythm, no significant murmurs / rubs / gallops. No extremity edema. 2+ pedal pulses. No carotid bruits. No significant JVD. Abdomen: Mild distension, mild generalized tenderness, no rebound pain or guarding, no masses palpated. Bowel sounds active.  Musculoskeletal: no clubbing /  cyanosis. No joint deformity upper and lower extremities. Normal muscle tone.  Skin: no significant rashes, lesions, ulcers. Warm, dry, well-perfused. Neurologic: CN 2-12 grossly intact. Sensation intact, DTR normal. Strength 5/5 in all 4 limbs.  Psychiatric: Normal judgment and insight. Alert and oriented x 3. Normal mood and affect.     Labs on Admission: I have personally reviewed following labs and imaging studies  CBC:  Recent Labs Lab 09/10/15 1914  WBC 12.1*  HGB 13.1  HCT 40.1  MCV 96.6  PLT [id number]   Basic Metabolic Panel:  Recent Labs Lab 09/10/15 1914  NA 136  K 4.1  CL 104  CO2 24  GLUCOSE 117*  BUN 16  CREATININE 0.92  CALCIUM 9.2   GFR: Estimated Creatinine Clearance: 41.7 mL/min (by C-G formula based on Cr of 0.92). Liver Function Tests:  Recent Labs Lab 09/10/15 1914  AST 18  ALT 10*  ALKPHOS 101  BILITOT 0.9  PROT 7.3  ALBUMIN 4.1    Recent Labs Lab 09/10/15 1914  LIPASE 18   No results for input(s): AMMONIA in the last 168 hours. Coagulation Profile: No results for input(s): INR, PROTIME in the last 168 hours. Cardiac Enzymes: No results for input(s): CKTOTAL, CKMB, CKMBINDEX, TROPONINI in the last 168 hours. BNP (last 3 results) No results for input(s): PROBNP in the last 8760 hours. HbA1C: No results for input(s): HGBA1C in the last 72 hours. CBG: No results for input(s): GLUCAP in the last 168 hours. Lipid Profile: No results for input(s): CHOL, HDL, LDLCALC, TRIG, CHOLHDL, LDLDIRECT in the last 72 hours. Thyroid Function Tests: No results for input(s): TSH, T4TOTAL, FREET4, T3FREE, THYROIDAB in the last 72 hours. Anemia Panel: No results for input(s): VITAMINB12, FOLATE, FERRITIN, TIBC, IRON, RETICCTPCT in the last 72 hours. Urine analysis:    Component Value Date/Time   COLORURINE YELLOW 09/10/2015 1922   APPEARANCEUR CLOUDY* 09/10/2015 1922   LABSPEC 1.024 09/10/2015 1922   PHURINE 5.5 09/10/2015 1922   GLUCOSEU  NEGATIVE 09/10/2015 1922   GLUCOSEU NEGATIVE 04/17/2015 1108   [place] 09/10/2015 1922  HGBUR large 03/16/2010 [place] 09/10/2015 [place] 09/10/2015 1922   PROTEINUR NEGATIVE 09/10/2015 1922   UROBILINOGEN 0.2 04/17/2015 1108   NITRITE NEGATIVE 09/10/2015 1922   LEUKOCYTESUR SMALL* 09/10/2015 1922   Sepsis Labs: @LABRCNTIP (procalcitonin:4,lacticidven:4) )No results found for this or any previous visit (from the past 240 hour(s)).   Radiological Exams on Admission: Ct Abdomen Pelvis W Contrast  09/10/2015  CLINICAL DATA:  Abdominal distention with generalized abdominal pain, nausea, vomiting and diarrhea since yesterday. EXAM: CT ABDOMEN AND PELVIS WITH CONTRAST TECHNIQUE: Multidetector CT imaging of the abdomen and pelvis was performed using the standard protocol following bolus administration of intravenous contrast. CONTRAST:  117mL ISOVUE-300 IOPAMIDOL (ISOVUE-300) INJECTION 61% COMPARISON:  08/08/2006 FINDINGS: Lung bases demonstrate several bilateral pulmonary nodules with slight overall increase in size compared to the pre exams and some which are new. The largest nodule is over the right middle lobe measuring 6 mm. Minimal focal scarring over the right middle lobe. No effusion. Abdominal images demonstrate evidence previous cholecystectomy. There are a few sub cm liver hypodensities too small to characterize but likely cysts. Minimal prominence of the central intrahepatic ducts likely due to patient's previous cholecystectomy. Common bile duct is within normal. The spleen, pancreas and adrenal glands are within normal. Left kidney is normal. Atrophic multi cystic right kidney unchanged. Ureters are within normal. Minimal calcified plaque of the abdominal aorta. Evidence of previous appendectomy. The stomach is within normal. Air and stool present throughout the colon. There is moderate diverticulosis of the sigmoid colon without active  inflammation. There are multiple dilated small bowel loops measuring up to 3.9 cm in diameter. Definite transition point is identified. There is no significant free fluid focal inflammatory change. No evidence of free peritoneal air. Pelvic images demonstrate the bladder and rectum to be within normal. Surgical absence of the uterus. There is fusion hardware over the lumbar spine. Degenerative change of the spine hips. IMPRESSION: Multiple dilated small bowel loops measuring up to 3.9 cm in diameter with air in stool throughout the colon as findings likely represent early/partial small bowel obstruction. No definite transition point as etiology may be adhesions. Colonic diverticulosis without active inflammation. Multiple pulmonary nodules the lung bases with slight interval progression. Largest nodule measures 6 mm over the right middle lobe. Recommend chest CT for complete evaluation of the lungs. Several small liver hypodensities likely cysts or hemangiomas. Chronic atrophic polycystic right kidney. Electronically Signed   By: Marin Olp M.D.   On: 09/10/2015 22:03    EKG:  Not performed, will obtain as appropriate.   Assessment/Plan  1. SBO, partial  - Likely secondary to adhesions  - Pt continues to pass flatus on admission  - Gen surgery on the case and much appreciated  - NGT decompression; NPO; prn analgesia and antiemetics  - Gentle IVF hydration    2. Leukocytosis  - WBC 12,100 on admission  - Suspect this is reflective of stress demargination from SBO with N/V rather than an infectious process  - Culture if spikes fever  - Watch off of abx   3. Pulmonary nodules  - Incidental notation made on CT abdomen of slight interval increase in pulmonary nodules  - Dedicated chest CT advised; can be done outpatient    4. Hypertension  - At goal currently  - Managed with diltiazem at home  - Holding oral agents currently - Treat with hydralazine IVP's prn   5. Hyperlipidemia  - LDL  52, HDL  37 in December 2016  - Continue current-dose Lipitor once appropriate for PO intake    6. Arthritis - Attributed to OA and s/p right TKA  - Managed with Celebrex at home  - Holding oral agents currently; IV analgesia available prn     DVT prophylaxis: sq heparin  Code Status: Full  Family Communication: Discussed with patient  Disposition Plan: Admit to med/surg Consults called: GI consulted by EDP  Admission status: Inpatient    Vianne Bulls, MD [facility] Pager [phone]  If 7PM-7AM, please contact night-coverage [email or web address] Password [facility]  09/10/2015, 11:07 PM

## 2015-09-10 NOTE — Progress Notes (Signed)
Subjective:    Patient ID: [name], female    DOB: Jan 12, 1930, 82 y.o.   MRN: [id number]  HPI here with 1 day hx of sudden onset abd pain with abd distension, recurrent eructations, then worsening nausea overnight, then vomit x 1 earlier today, worsening pain and distension today, as well as several small volume loss stools.  Denies fever, chills, Denies worsening reflux, dysphagia, or blood.  Pt denies chest pain, increased sob or doe, wheezing, orthopnea, PND, increased LE swelling, palpitations, dizziness or syncope, except when sitting up the abdomen distension pushes up on the chest, breathing is harder.  Lived in a NH x 5 yrs, then home for last 2.5 yrs, but visits fairly frequently.  No hx of Cdiff.  Symptoms seemed to start immediately after eating tuna sandwich at the NH where she plays cards with friends. Came here as did not want the long wait in the ER Past Medical History  Diagnosis Date  . Hyperlipidemia   . Hypertension   . Anxiety and depression   . Pulmonary nodules     [place]. First seen by CT only 05/04/03, no change on f/u 08/08/06 -Macroscopic changes rml 04/27/07 > resolved april 6,2010 no further w/u   . Knee pain     right chronic  . Back pain     lumbar  . SVT (supraventricular tachycardia) (HCC)     sees Dr.Ross  . Hydronephrosis     Right 2008 s/p urology eval CT/cysto: observation/monitor  . Headache(784.0)   . GI (gastrointestinal bleed)     hx of colon polyps 2004, diverticulosis, internal hemmorhids  . Migraine     on topamax , per neuro  . Seborrheic keratosis     Left inferior posterior flank  . Fecal incontinence     Dr. Michail Sermon, likely due to loose stools in conjunction with pelvic muscle laxity from aging, recommended to stop Miralax   Past Surgical History  Procedure Laterality Date  . Abdominal hysterectomy    . Rotator cuff repair  9/-2011  . Total knee arthroplasty      right  . Cataract extraction  2008    Dr.Digby  . R ovary  removed    . Lumbar reconstructive surgery  01/07/2009  . Appendectomy    . Cholecystectomy    . Tonsillectomy    . Bladder repair    . Shoulder surgery  12-2011    LEFT  . Trigger finger surgery      L thumb (2014), R ring finger 05-2013  . Basal cell carcinoma excision      reports that she has never smoked. She has never used smokeless tobacco. She reports that she drinks alcohol. She reports that she does not use illicit drugs. family history includes Breast cancer in her mother; Cancer in her brother; Colitis in her brother; Diabetes in her mother; Heart disease in her father and mother; Lung cancer in her father; Prostate cancer in her father. There is no history of Colon cancer. Allergies  Allergen Reactions  . Amitriptyline Hcl     REACTION: hallucinations  . Carisoprodol     REACTION: nightmares,rash,tachycardia  . Codeine     REACTION: "deathly ill" nauesa, dizzy,weak  . Meloxicam     REACTION: sensitive to sun; facial redness; may cause severe abd pain  . Metronidazole     REACTION: abd pain  . Nortriptyline Hcl     REACTION: hallucinations  . Oxaprozin     REACTION: abd pain  .  Promethazine Hcl     REACTION: spastic arms and legs flailing while awake and unusual behavior while sleep - walking all over house during night  . Rofecoxib     REACTION: rash  . Sumatriptan     REACTION: rash  . Tramadol Other (See Comments)    Dizziness and constipation   Current Outpatient Prescriptions on File Prior to Visit  Medication Sig Dispense Refill  . Acetaminophen (TYLENOL 8 HOUR PO) Take 500 mg by mouth every 4 (four) hours.     Marland Kitchen alendronate (FOSAMAX) 70 MG tablet Take 1 tablet (70 mg total) by mouth every 7 (seven) days. Take with a full glass of water on an empty stomach. 12 tablet 3  . Ascorbic Acid (VITAMIN C PO) Take 1 tablet by mouth 2 (two) times daily.     Marland Kitchen aspirin 81 MG tablet Take 81 mg by mouth daily.      Marland Kitchen atorvastatin (LIPITOR) 40 MG tablet Take 1 tablet (40  mg total) by mouth at bedtime. 90 tablet 2  . azelastine (ASTELIN) 0.1 % nasal spray Place 2 sprays into both nostrils 2 (two) times daily. 30 mL 6  . calcium gluconate 500 MG tablet Take 500 mg by mouth 2 (two) times daily. Reported on 07/04/2015    . celecoxib (CELEBREX) 200 MG capsule Take 200 mg by mouth daily.    . cyclobenzaprine (FLEXERIL) 5 MG tablet Take 5 mg by mouth daily as needed. Reported on 07/04/2015    . diltiazem (CARDIZEM CD) 120 MG 24 hr capsule Take 1 capsule (120 mg total) by mouth daily. 90 capsule 2  . Docusate Sodium (COLACE PO) Take by mouth as needed.    . gabapentin (NEURONTIN) 100 MG capsule Take 200-300 mg by mouth 3 (three) times daily as needed.     Marland Kitchen LOTEMAX 0.5 % GEL Place 1 drop into both eyes as needed.   0  . methocarbamol (ROBAXIN) 500 MG tablet Take 500 mg by mouth 3 (three) times daily.    . multivitamin (THERAGRAN) per tablet Take 1 tablet by mouth daily.      . NON FORMULARY 2 capsules 2 (two) times daily. HydroEye (pt. States she gets from Dr. Doy Mince EYES    . Polyethyl Glycol-Propyl Glycol (SYSTANE OP) Apply to eye at bedtime.    . Probiotic Product (PROBIOTIC DAILY PO) Take by mouth.    . Propylene Glycol-Glycerin (SOOTHE) 0.6-0.6 % SOLN Apply to eye as needed.    . Tolterodine Tartrate (DETROL PO) Take by mouth.    . topiramate (TOPAMAX) 25 MG capsule Take 25 mg by mouth at bedtime. Reported on 07/04/2015     No current facility-administered medications on file prior to visit.   Review of Systems  Constitutional: Negative for unusual diaphoresis or night sweats HENT: Negative for ear swelling or discharge Eyes: Negative for worsening visual haziness  Respiratory: Negative for choking and stridor.   Genitourinary: Negative for retention or change in urine volume.  Musculoskeletal: Negative for other MSK pain or swelling Skin: Negative for color change and worsening wound Neurological: Negative for tremors and numbness other than noted    Psychiatric/Behavioral: Negative for decreased concentration or agitation other than above       Objective:   Physical Exam BP 116/72 mmHg  Pulse 60  Temp(Src) 98 F (36.7 C) (Oral)  Resp 20  Wt 140 lb (63.504 kg)  SpO2 94% VS noted, at least mild ill appearing, weak, somewhat breathless to talk sitting up  with abd distension Constitutional: Pt appears in no apparent distress HENT: Head: NCAT.  Right Ear: External ear normal.  Left Ear: External ear normal.  Eyes: . Pupils are equal, round, and reactive to light. Conjunctivae and EOM are normal Neck: Normal range of motion. Neck supple.  Cardiovascular: Normal rate and regular rhythm.   Pulmonary/Chest: Effort normal and breath sounds without rales or wheezing.  Abd:  Soft, + BS, mod to severe distended, mild diffuse tender but moderate over LLQ, no guarding or rebound Neurological: Pt is alert. Not confused , motor grossly intact Skin: Skin is warm. No rash, no LE edema Psychiatric: Pt behavior is normal. No agitation. mild nervous    Assessment & Plan:

## 2015-09-10 NOTE — ED Notes (Signed)
Unsuccessful inserting 18 FR NG tube into left nostril.  Pt has previous trauma in right nose from hospitalization in 2010.  Pt tearful and in extreme pain at this time.

## 2015-09-10 NOTE — Assessment & Plan Note (Signed)
stable overall by history and exam, recent data reviewed with pt, and pt to continue medical treatment as before,  to f/u any worsening symptoms or concerns BP Readings from Last 3 Encounters:  09/10/15 116/72  07/04/15 118/68  04/17/15 108/64

## 2015-09-10 NOTE — ED Notes (Signed)
Patient c/o abdominal distention, pain, N/V/D since yesterday. Patient denies any blood in her emesis or stool.

## 2015-09-10 NOTE — Assessment & Plan Note (Signed)
Mild situational, pt reassured, no need for med change at this time

## 2015-09-11 ENCOUNTER — Encounter (HOSPITAL_COMMUNITY): Payer: Self-pay | Admitting: *Deleted

## 2015-09-11 ENCOUNTER — Inpatient Hospital Stay (HOSPITAL_COMMUNITY): Payer: Medicare Other

## 2015-09-11 DIAGNOSIS — I10 Essential (primary) hypertension: Secondary | ICD-10-CM

## 2015-09-11 DIAGNOSIS — R918 Other nonspecific abnormal finding of lung field: Secondary | ICD-10-CM

## 2015-09-11 DIAGNOSIS — K5669 Other intestinal obstruction: Secondary | ICD-10-CM

## 2015-09-11 LAB — BASIC METABOLIC PANEL
Anion gap: 7 (ref 5–15)
BUN: 15 mg/dL (ref 6–20)
CALCIUM: 8.2 mg/dL — AB (ref 8.9–10.3)
CO2: 23 mmol/L (ref 22–32)
CREATININE: 0.88 mg/dL (ref 0.44–1.00)
Chloride: 107 mmol/L (ref 101–111)
GFR calc Af Amer: >60 mL/min (ref >60)
GFR, EST NON AFRICAN AMERICAN: 59 mL/min — AB (ref >60)
Glucose, Bld: 115 mg/dL — ABNORMAL HIGH (ref 65–99)
Potassium: 3.9 mmol/L (ref 3.5–5.1)
SODIUM: 137 mmol/L (ref 135–145)

## 2015-09-11 LAB — GLUCOSE, CAPILLARY: Glucose-Capillary: 120 mg/dL — ABNORMAL HIGH (ref 65–99)

## 2015-09-11 MED ORDER — LORAZEPAM 2 MG/ML IJ SOLN
1.0000 mg | Freq: Once | INTRAMUSCULAR | Status: AC
  Administered 2015-09-11: 1 mg via INTRAVENOUS
  Filled 2015-09-11: qty 1

## 2015-09-11 MED ORDER — PHENOL 1.4 % MT LIQD
1.0000 | OROMUCOSAL | Status: DC | PRN
  Administered 2015-09-11: 1 via OROMUCOSAL
  Filled 2015-09-11: qty 177

## 2015-09-11 MED ORDER — BENZOCAINE 20 % MT AERO
INHALATION_SPRAY | Freq: Once | OROMUCOSAL | Status: AC
  Administered 2015-09-11: 1 via OROMUCOSAL
  Filled 2015-09-11: qty 57

## 2015-09-11 NOTE — Consult Note (Signed)
Reason for Consult:abd pain Referring Physician: Dr Edwyna Ready is an 82 y.o. female.  HPI: 40 yof with multiple abdominal surgeries who developed abdominal distention yesterday.  She has prior hysterectomy, rso, appy, cholecystectomy and what sounds like elap with loa in 2005 or so where apparently she sustained ligation of her right ureter.  She had pain overnight leading her to come to er as it was progressive.  She had some loose stools and some flatus yesterday but none since then. She has been vomiting. No fevers.  She also states that her stools have become smaller lately. She cannot remember exactly when last csc was but appears to be somewhere around 2010 at [facility] according to a notation by her pcp that says she has diverticuli but otherwise normal.  Past Medical History  Diagnosis Date  . Hyperlipidemia   . Hypertension   . Anxiety and depression   . Pulmonary nodules     [place]. First seen by CT only 05/04/03, no change on f/u 08/08/06 -Macroscopic changes rml 04/27/07 > resolved april 6,2010 no further w/u   . Knee pain     right chronic  . Back pain     lumbar  . SVT (supraventricular tachycardia) (HCC)     sees Dr.Ross  . Hydronephrosis     Right 2008 s/p urology eval CT/cysto: observation/monitor  . Headache(784.0)   . GI (gastrointestinal bleed)     hx of colon polyps 2004, diverticulosis, internal hemmorhids  . Migraine     on topamax , per neuro  . Seborrheic keratosis     Left inferior posterior flank  . Fecal incontinence     Dr. Michail Sermon, likely due to loose stools in conjunction with pelvic muscle laxity from aging, recommended to stop Miralax    Past Surgical History  Procedure Laterality Date  . Abdominal hysterectomy    . Rotator cuff repair  9/-2011  . Total knee arthroplasty      right  . Cataract extraction  2008    Dr.Digby  . R ovary removed    . Lumbar reconstructive surgery  01/07/2009  . Appendectomy    . Cholecystectomy    .  Tonsillectomy    . Bladder repair    . Shoulder surgery  12-2011    LEFT  . Trigger finger surgery      L thumb (2014), R ring finger 05-2013  . Basal cell carcinoma excision      Family History  Problem Relation Age of Onset  . Colon cancer Neg Hx   . Breast cancer Mother     later in life  . Diabetes Mother     late in life  . Heart disease Mother     late onset  . Lung cancer Father     apparently had TB  . Heart disease Father     late onset  . Prostate cancer Father   . Colitis Brother   . Cancer Brother     Social History:  reports that she has never smoked. She has never used smokeless tobacco. She reports that she drinks alcohol. She reports that she does not use illicit drugs.  Allergies:  Allergies  Allergen Reactions  . Amitriptyline Hcl     REACTION: hallucinations  . Carisoprodol     REACTION: nightmares,rash,tachycardia  . Codeine     REACTION: "deathly ill" nauesa, dizzy,weak  . Meloxicam     REACTION: sensitive to sun; facial redness; may cause severe  abd pain  . Metronidazole     REACTION: abd pain  . Nortriptyline Hcl     REACTION: hallucinations  . Oxaprozin     REACTION: abd pain  . Promethazine Hcl     REACTION: spastic arms and legs flailing while awake and unusual behavior while sleep - walking all over house during night  . Rofecoxib     REACTION: rash  . Sumatriptan     REACTION: rash  . Tramadol Other (See Comments)    Dizziness and constipation    Medications: I have reviewed the patient's current medications.  Results for orders placed or performed during the hospital encounter of 09/10/15 (from the past 48 hour(s))  Lipase, blood     Status: None   Collection Time: 09/10/15  7:14 PM  Result Value Ref Range   Lipase 18 11 - 51 U/L  Comprehensive metabolic panel     Status: Abnormal   Collection Time: 09/10/15  7:14 PM  Result Value Ref Range   Sodium 136 135 - 145 mmol/L   Potassium 4.1 3.5 - 5.1 mmol/L   Chloride 104 101 -  111 mmol/L   CO2 24 22 - 32 mmol/L   Glucose, Bld 117 (H) 65 - 99 mg/dL   BUN 16 6 - 20 mg/dL   Creatinine, Ser 0.92 0.44 - 1.00 mg/dL   Calcium 9.2 8.9 - 10.3 mg/dL   Total Protein 7.3 6.5 - 8.1 g/dL   Albumin 4.1 3.5 - 5.0 g/dL   AST 18 15 - 41 U/L   ALT 10 (L) 14 - 54 U/L   Alkaline Phosphatase 101 38 - 126 U/L   Total Bilirubin 0.9 0.3 - 1.2 mg/dL   GFR calc non Af Amer 56 (L) >60 mL/min   GFR calc Af Amer >60 >60 mL/min    Comment: (NOTE) The eGFR has been calculated using the CKD EPI equation. This calculation has not been validated in all clinical situations. eGFR's persistently <60 mL/min signify possible Chronic Kidney Disease.    Anion gap 8 5 - 15  CBC     Status: Abnormal   Collection Time: 09/10/15  7:14 PM  Result Value Ref Range   WBC 12.1 (H) 4.0 - 10.5 K/uL   RBC 4.15 3.87 - 5.11 MIL/uL   Hemoglobin 13.1 12.0 - 15.0 g/dL   HCT 40.1 36.0 - 46.0 %   MCV 96.6 78.0 - 100.0 fL   MCH 31.6 26.0 - 34.0 pg   MCHC 32.7 30.0 - 36.0 g/dL   RDW 13.7 11.5 - 15.5 %   Platelets 268 150 - 400 K/uL  Urinalysis, Routine w reflex microscopic     Status: Abnormal   Collection Time: 09/10/15  7:22 PM  Result Value Ref Range   Color, Urine YELLOW YELLOW   APPearance CLOUDY (A) CLEAR   Specific Gravity, Urine 1.024 1.005 - 1.030   pH 5.5 5.0 - 8.0   Glucose, UA NEGATIVE NEGATIVE mg/dL   Hgb urine dipstick NEGATIVE NEGATIVE   Bilirubin Urine NEGATIVE NEGATIVE   Ketones, ur NEGATIVE NEGATIVE mg/dL   Protein, ur NEGATIVE NEGATIVE mg/dL   Nitrite NEGATIVE NEGATIVE   Leukocytes, UA SMALL (A) NEGATIVE  Urine microscopic-add on     Status: Abnormal   Collection Time: 09/10/15  7:22 PM  Result Value Ref Range   Squamous Epithelial / LPF 0-5 (A) NONE SEEN   WBC, UA 0-5 0 - 5 WBC/hpf   RBC / HPF 0-5 0 - 5 RBC/hpf  Bacteria, UA FEW (A) NONE SEEN   Casts HYALINE CASTS (A) NEGATIVE   Urine-Other MUCOUS PRESENT   I-Stat CG4 Lactic Acid, ED     Status: None   Collection Time:  09/10/15  8:33 PM  Result Value Ref Range   Lactic Acid, Venous 1.55 0.5 - 2.0 mmol/L  I-Stat CG4 Lactic Acid, ED     Status: None   Collection Time: 09/10/15 10:44 PM  Result Value Ref Range   Lactic Acid, Venous 1.60 0.5 - 2.0 mmol/L  Basic metabolic panel     Status: Abnormal   Collection Time: 09/11/15  4:21 AM  Result Value Ref Range   Sodium 137 135 - 145 mmol/L   Potassium 3.9 3.5 - 5.1 mmol/L   Chloride 107 101 - 111 mmol/L   CO2 23 22 - 32 mmol/L   Glucose, Bld 115 (H) 65 - 99 mg/dL   BUN 15 6 - 20 mg/dL   Creatinine, Ser 0.88 0.44 - 1.00 mg/dL   Calcium 8.2 (L) 8.9 - 10.3 mg/dL   GFR calc non Af Amer 59 (L) >60 mL/min   GFR calc Af Amer >60 >60 mL/min    Comment: (NOTE) The eGFR has been calculated using the CKD EPI equation. This calculation has not been validated in all clinical situations. eGFR's persistently <60 mL/min signify possible Chronic Kidney Disease.    Anion gap 7 5 - 15    Dg Abd 1 View  09/10/2015  CLINICAL DATA:  Nasogastric tube placement. EXAM: ABDOMEN - 1 VIEW COMPARISON:  CT abdomen and pelvis Sep 10, 2015 at 2136 hours FINDINGS: Interval placement of nasogastric tube, distal tip projects in gastric antrum. Multiple loops of gas distended small and large bowel partially imaged. Contrast in the LEFT renal collecting system. Lumbar hardware in place, status post L 5 S1 laminectomies. Lung bases are clear. IMPRESSION: Nasogastric tube tip projects in gastric antrum. Partially imaged gas distended small and large bowel. Electronically Signed   By: Elon Alas M.D.   On: 09/10/2015 23:51   Ct Abdomen Pelvis W Contrast  09/10/2015  CLINICAL DATA:  Abdominal distention with generalized abdominal pain, nausea, vomiting and diarrhea since yesterday. EXAM: CT ABDOMEN AND PELVIS WITH CONTRAST TECHNIQUE: Multidetector CT imaging of the abdomen and pelvis was performed using the standard protocol following bolus administration of intravenous contrast.  CONTRAST:  151m ISOVUE-300 IOPAMIDOL (ISOVUE-300) INJECTION 61% COMPARISON:  08/08/2006 FINDINGS: Lung bases demonstrate several bilateral pulmonary nodules with slight overall increase in size compared to the pre exams and some which are new. The largest nodule is over the right middle lobe measuring 6 mm. Minimal focal scarring over the right middle lobe. No effusion. Abdominal images demonstrate evidence previous cholecystectomy. There are a few sub cm liver hypodensities too small to characterize but likely cysts. Minimal prominence of the central intrahepatic ducts likely due to patient's previous cholecystectomy. Common bile duct is within normal. The spleen, pancreas and adrenal glands are within normal. Left kidney is normal. Atrophic multi cystic right kidney unchanged. Ureters are within normal. Minimal calcified plaque of the abdominal aorta. Evidence of previous appendectomy. The stomach is within normal. Air and stool present throughout the colon. There is moderate diverticulosis of the sigmoid colon without active inflammation. There are multiple dilated small bowel loops measuring up to 3.9 cm in diameter. Definite transition point is identified. There is no significant free fluid focal inflammatory change. No evidence of free peritoneal air. Pelvic images demonstrate the bladder and rectum to  be within normal. Surgical absence of the uterus. There is fusion hardware over the lumbar spine. Degenerative change of the spine hips. IMPRESSION: Multiple dilated small bowel loops measuring up to 3.9 cm in diameter with air in stool throughout the colon as findings likely represent early/partial small bowel obstruction. No definite transition point as etiology may be adhesions. Colonic diverticulosis without active inflammation. Multiple pulmonary nodules the lung bases with slight interval progression. Largest nodule measures 6 mm over the right middle lobe. Recommend chest CT for complete evaluation of  the lungs. Several small liver hypodensities likely cysts or hemangiomas. Chronic atrophic polycystic right kidney. Electronically Signed   By: Marin Olp M.D.   On: 09/10/2015 22:03    Review of Systems  Constitutional: Negative for fever and chills.  Respiratory: Negative for shortness of breath.   Cardiovascular: Negative for chest pain.  Gastrointestinal: Positive for nausea, vomiting, abdominal pain and diarrhea. Negative for blood in stool.   Blood pressure 101/46, pulse 77, temperature 99.2 F (37.3 C), temperature source Oral, resp. rate 16, height 5' 3.5" (1.613 m), weight 68.811 kg (151 lb 11.2 oz), SpO2 94 %. Physical Exam  Vitals reviewed. Constitutional: She is oriented to person, place, and time. She appears well-developed and well-nourished.  Neck: Neck supple.  Cardiovascular: Normal rate.   Respiratory: Effort normal.  GI: Soft. She exhibits distension. There is no tenderness.    Neurological: She is alert and oriented to person, place, and time.    Assessment/Plan: sbo  Will manage conservatively and place on sbo protocol. Her large bowel does appear somewhat dilated on a film and the fact that she is having smaller stools may be constipation but will need to be evaluated at some point also.  Hopefully will resolve conservatively   [facility] 09/11/2015, 6:41 AM

## 2015-09-11 NOTE — Progress Notes (Signed)
PROGRESS NOTE    [name]  [id number] DOB: [birth year] DOA: 09/10/2015 PCP: Kathlene November, MD   Brief Narrative: [name] is a pleasant 82 year old female with a past medical history of hysterectomy, cholecystectomy, appendectomy, presented to the emergency department on 09/11/2015 with complaints of abdominal pain associated with distention and nausea and vomiting. She reports having a history of small bowel obstruction as well. Workup in the emergency department included a CT scan of abdomen and pelvis that reveals multiple dilated small bowel loops consistent with partial small bowel obstruction. Suspect partial small bowel obstruction secondary to adhesions. Gen. surgery was consulted as an NG tube was placed.   Assessment & Plan:   Principal Problem:   Small bowel obstruction, partial (HCC) Active Problems:   Hyperlipidemia   Essential hypertension   Pulmonary nodules/lesions, multiple   Leukocytosis   Arthritis   Partial small bowel obstruction ([place])  1.  Partial small bowel obstruction -[name] is a pleasant 82 year old with a past history of multiple abdominal surgeries as well as having a history of small bowel obstruction, presented to the emergency room with complaints of abdominal pain of his associate with distention and nausea/vomiting. -CT imaging of abdomen and pelvis performed in the emergency room revealed findings consistent with partial small bowel obstruction -Suspect adhesions playing a role in the development of partial small bowel obstruction. -NG tube was placed, she is on bowel rest, IV fluid resuscitation, supportive care. -Gen. surgery following. Plan to continue conservative management.  2.  History of hypertension. -She is currently on diltiazem 120 mg by mouth daily  3.  Pulmonary nodules. -CT imaging of abdomen and pelvis revealing multiple bilateral pulmonary nodules at lung bases. -Plan to further workup with dedicated CT scan of  lungs in am   DVT prophylaxis: Subcutaneous heparin Code Status: Full code Family Communication:  Disposition Plan: Anticipate discharge home once medically stable the next 48-72 hours  Consultants:   General surgery   Subjective: [name] reports improvement to abdominal pain and distention. Currently nothing by mouth with NG tube in place  Objective: Filed Vitals:   09/10/15 1823 09/10/15 2149 09/11/15 0012 09/11/15 0546  BP: 132/56 119/67 131/56 101/46  Pulse: 89 81 89 77  Temp: 98.2 F (36.8 C)  99 F (37.2 C) 99.2 F (37.3 C)  TempSrc: Oral  Oral Oral  Resp: 16 15 16 16   Height:   5' 3.5" (1.613 m)   Weight:   68.811 kg (151 lb 11.2 oz)   SpO2: 97% 94% 98% 94%    Intake/Output Summary (Last 24 hours) at 09/11/15 1355 Last data filed at 09/11/15 1000  Gross per 24 hour  Intake 588.33 ml  Output    600 ml  Net -11.67 ml   Filed Weights   09/11/15 0012  Weight: 68.811 kg (151 lb 11.2 oz)    Examination:  General exam: Appears calm and comfortable, NG tube in place. Respiratory system: Clear to auscultation. Respiratory effort normal. Cardiovascular system: S1 & S2 heard, RRR. No JVD, murmurs, rubs, gallops or clicks. No pedal edema. Gastrointestinal system: Abdomen is tympanic, ongoing distention although she reports her has been interim improvement. Mild generalized tenderness to palpation Central nervous system: Alert and oriented. No focal neurological deficits. Extremities: Symmetric 5 x 5 power. Skin: No rashes, lesions or ulcers Psychiatry: Judgement and insight appear normal. Mood & affect appropriate.     Data Reviewed: I have personally reviewed following labs and imaging studies  CBC:  Recent  Labs Lab 09/10/15 1914  WBC 12.1*  HGB 13.1  HCT 40.1  MCV 96.6  PLT [id number]   Basic Metabolic Panel:  Recent Labs Lab 09/10/15 1914 09/11/15 0421  NA 136 137  K 4.1 3.9  CL 104 107  CO2 24 23  GLUCOSE 117* 115*  BUN 16 15  CREATININE  0.92 0.88  CALCIUM 9.2 8.2*   GFR: Estimated Creatinine Clearance: 45.7 mL/min (by C-G formula based on Cr of 0.88). Liver Function Tests:  Recent Labs Lab 09/10/15 1914  AST 18  ALT 10*  ALKPHOS 101  BILITOT 0.9  PROT 7.3  ALBUMIN 4.1    Recent Labs Lab 09/10/15 1914  LIPASE 18   No results for input(s): AMMONIA in the last 168 hours. Coagulation Profile: No results for input(s): INR, PROTIME in the last 168 hours. Cardiac Enzymes: No results for input(s): CKTOTAL, CKMB, CKMBINDEX, TROPONINI in the last 168 hours. BNP (last 3 results) No results for input(s): PROBNP in the last 8760 hours. HbA1C: No results for input(s): HGBA1C in the last 72 hours. CBG:  Recent Labs Lab 09/11/15 0816  GLUCAP 120*   Lipid Profile: No results for input(s): CHOL, HDL, LDLCALC, TRIG, CHOLHDL, LDLDIRECT in the last 72 hours. Thyroid Function Tests: No results for input(s): TSH, T4TOTAL, FREET4, T3FREE, THYROIDAB in the last 72 hours. Anemia Panel: No results for input(s): VITAMINB12, FOLATE, FERRITIN, TIBC, IRON, RETICCTPCT in the last 72 hours. Sepsis Labs:  Recent Labs Lab 09/10/15 2033 09/10/15 2244  LATICACIDVEN 1.55 1.60    No results found for this or any previous visit (from the past 240 hour(s)).       Radiology Studies: Dg Abd 1 View  09/10/2015  CLINICAL DATA:  Nasogastric tube placement. EXAM: ABDOMEN - 1 VIEW COMPARISON:  CT abdomen and pelvis Sep 10, 2015 at 2136 hours FINDINGS: Interval placement of nasogastric tube, distal tip projects in gastric antrum. Multiple loops of gas distended small and large bowel partially imaged. Contrast in the LEFT renal collecting system. Lumbar hardware in place, status post L 5 S1 laminectomies. Lung bases are clear. IMPRESSION: Nasogastric tube tip projects in gastric antrum. Partially imaged gas distended small and large bowel. Electronically Signed   By: Elon Alas M.D.   On: 09/10/2015 23:51   Ct Abdomen Pelvis W  Contrast  09/10/2015  CLINICAL DATA:  Abdominal distention with generalized abdominal pain, nausea, vomiting and diarrhea since yesterday. EXAM: CT ABDOMEN AND PELVIS WITH CONTRAST TECHNIQUE: Multidetector CT imaging of the abdomen and pelvis was performed using the standard protocol following bolus administration of intravenous contrast. CONTRAST:  124mL ISOVUE-300 IOPAMIDOL (ISOVUE-300) INJECTION 61% COMPARISON:  08/08/2006 FINDINGS: Lung bases demonstrate several bilateral pulmonary nodules with slight overall increase in size compared to the pre exams and some which are new. The largest nodule is over the right middle lobe measuring 6 mm. Minimal focal scarring over the right middle lobe. No effusion. Abdominal images demonstrate evidence previous cholecystectomy. There are a few sub cm liver hypodensities too small to characterize but likely cysts. Minimal prominence of the central intrahepatic ducts likely due to patient's previous cholecystectomy. Common bile duct is within normal. The spleen, pancreas and adrenal glands are within normal. Left kidney is normal. Atrophic multi cystic right kidney unchanged. Ureters are within normal. Minimal calcified plaque of the abdominal aorta. Evidence of previous appendectomy. The stomach is within normal. Air and stool present throughout the colon. There is moderate diverticulosis of the sigmoid colon without active inflammation. There are  multiple dilated small bowel loops measuring up to 3.9 cm in diameter. Definite transition point is identified. There is no significant free fluid focal inflammatory change. No evidence of free peritoneal air. Pelvic images demonstrate the bladder and rectum to be within normal. Surgical absence of the uterus. There is fusion hardware over the lumbar spine. Degenerative change of the spine hips. IMPRESSION: Multiple dilated small bowel loops measuring up to 3.9 cm in diameter with air in stool throughout the colon as findings likely  represent early/partial small bowel obstruction. No definite transition point as etiology may be adhesions. Colonic diverticulosis without active inflammation. Multiple pulmonary nodules the lung bases with slight interval progression. Largest nodule measures 6 mm over the right middle lobe. Recommend chest CT for complete evaluation of the lungs. Several small liver hypodensities likely cysts or hemangiomas. Chronic atrophic polycystic right kidney. Electronically Signed   By: Marin Olp M.D.   On: 09/10/2015 22:03    Scheduled Meds: . acidophilus  1 capsule Oral Daily  . aspirin EC  81 mg Oral QHS  . atorvastatin  40 mg Oral QHS  . diltiazem  120 mg Oral Daily  . famotidine (PEPCID) IV  20 mg Intravenous QHS  . heparin  5,000 Units Subcutaneous Q8H  . multivitamin with minerals  1 tablet Oral Daily   Continuous Infusions:    LOS: 1 day    Time spent: 30 min   Kelvin Cellar, MD [facility] Pager [phone]  If 7PM-7AM, please contact night-coverage [email or web address] Password [email or web address] 09/11/2015, 1:55 PM

## 2015-09-12 ENCOUNTER — Inpatient Hospital Stay (HOSPITAL_COMMUNITY): Payer: Medicare Other

## 2015-09-12 LAB — BASIC METABOLIC PANEL
ANION GAP: 6 (ref 5–15)
BUN: 14 mg/dL (ref 6–20)
CALCIUM: 7.9 mg/dL — AB (ref 8.9–10.3)
CO2: 24 mmol/L (ref 22–32)
CREATININE: 0.63 mg/dL (ref 0.44–1.00)
Chloride: 110 mmol/L (ref 101–111)
Glucose, Bld: 82 mg/dL (ref 65–99)
Potassium: 3.5 mmol/L (ref 3.5–5.1)
Sodium: 140 mmol/L (ref 135–145)

## 2015-09-12 LAB — GLUCOSE, CAPILLARY: Glucose-Capillary: 82 mg/dL (ref 65–99)

## 2015-09-12 LAB — CBC
HEMATOCRIT: 32.7 % — AB (ref 36.0–46.0)
HEMOGLOBIN: 10.6 g/dL — AB (ref 12.0–15.0)
MCH: 31.5 pg (ref 26.0–34.0)
MCHC: 32.4 g/dL (ref 30.0–36.0)
MCV: 97 fL (ref 78.0–100.0)
Platelets: 269 10*3/uL (ref 150–400)
RBC: 3.37 MIL/uL — ABNORMAL LOW (ref 3.87–5.11)
RDW: 13.6 % (ref 11.5–15.5)
WBC: 6.8 10*3/uL (ref 4.0–10.5)

## 2015-09-12 MED ORDER — BENZOCAINE 20 % MT AERO
INHALATION_SPRAY | Freq: Two times a day (BID) | OROMUCOSAL | Status: DC | PRN
  Filled 2015-09-12: qty 57

## 2015-09-12 MED ORDER — IOPAMIDOL (ISOVUE-300) INJECTION 61%
100.0000 mL | Freq: Once | INTRAVENOUS | Status: AC | PRN
  Administered 2015-09-12: 80 mL via INTRAVENOUS

## 2015-09-12 NOTE — Discharge Instructions (Signed)
Small Bowel Obstruction °A small bowel obstruction is a blockage in the small bowel. The small bowel, which is also called the small intestine, is a long, slender tube that connects the stomach to the colon. When a person eats and drinks, food and fluids go from the stomach to the small bowel. This is where most of the nutrients in the food and fluids are absorbed. °A small bowel obstruction will prevent food and fluids from passing through the small bowel as they normally do during digestion. The small bowel can become partially or completely blocked. This can cause symptoms such as abdominal pain, vomiting, and bloating. If this condition is not treated, it can be dangerous because the small bowel could rupture. °CAUSES °Common causes of this condition include: °· Scar tissue from previous surgery or radiation treatment. °· Recent surgery. This may cause the movements of the bowel to slow down and cause food to block the intestine. °· Hernias. °· Inflammatory bowel disease (colitis). °· Twisting of the bowel (volvulus). °· Tumors. °· A foreign body. °· Slipping of a part of the bowel into another part (intussusception). °SYMPTOMS °Symptoms of this condition include: °· Abdominal pain. This may be dull cramps or sharp pain. It may occur in one area, or it may be present in the entire abdomen. Pain can range from mild to severe, depending on the degree of obstruction. °· Nausea and vomiting. Vomit may be greenish or a yellow bile color. °· Abdominal bloating. °· Constipation. °· Lack of passing gas. °· Frequent belching. °· Diarrhea. This may occur if the obstruction is partial and runny stool is able to leak around the obstruction. °DIAGNOSIS °This condition may be diagnosed based on a physical exam, medical history, and X-rays of the abdomen. You may also have other tests, such as a CT scan of the abdomen and pelvis. °TREATMENT °Treatment for this condition depends on the cause and severity of the problem.  Treatment options may include: °· Bed rest along with fluids and pain medicines that are given through an IV tube inserted into one of your veins. Sometimes, this is all that is needed for the obstruction to improve. °· Following a simple diet. In some cases, a clear liquid diet may be required for several days. This allows the bowel to rest. °· Placement of a small tube (nasogastric tube) into the stomach. When the bowel is blocked, it usually swells up like a balloon that is filled with air and fluids. The air and fluids may be removed by suction through the nasogastric tube. This can help with pain, discomfort, and nausea. It can also help the obstruction to clear up faster. °· Surgery. This may be required if other treatments do not work. Bowel obstruction from a hernia may require early surgery and can be an emergency procedure. Surgery may also be required for scar tissue that causes frequent or severe obstructions. °HOME CARE INSTRUCTIONS °· Get plenty of rest. °· Follow instructions from your health care provider about eating restrictions. You may need to avoid solid foods and consume only clear liquids until your condition improves. °· Take over-the-counter and prescription medicines only as told by your health care provider. °· Keep all follow-up visits as told by your health care provider. This is important. °SEEK MEDICAL CARE IF: °· You have a fever. °· You have chills. °SEEK IMMEDIATE MEDICAL CARE IF: °· You have increased pain or cramping. °· You vomit blood. °· You have uncontrolled vomiting or nausea. °· You cannot drink   fluids because of vomiting or pain.  You develop confusion.  You begin feeling very dry or thirsty (dehydrated).  You have severe bloating.  You feel extremely weak or you faint.   This information is not intended to replace advice given to you by your health care provider. Make sure you discuss any questions you have with your health care provider.   Document Released:  06/22/2005 Document Revised: 12/25/2014 Document Reviewed: 05/30/2014 Elsevier Interactive Patient Education 2016 [employer].  Low-Fiber Diet Eat this diet for 1-2 weeks before resuming a high fiber diet - this helps your bowels make a smooth transition back to normal. Fiber is found in fruits, vegetables, and whole grains. A low-fiber diet restricts fibrous foods that are not digested in the small intestine. A diet containing about 10-15 grams of fiber per day is considered low fiber. Low-fiber diets may be used to:  Promote healing and rest the bowel during intestinal flare-ups.  Prevent blockage of a partially obstructed or narrowed gastrointestinal tract.  Reduce fecal weight and volume.  Slow the movement of feces. You may be on a low-fiber diet as a transitional diet following surgery, after an injury (trauma), or because of a short (acute) or lifelong (chronic) illness. Your health care provider will determine the length of time you need to stay on this diet.  WHAT DO I NEED TO KNOW ABOUT A LOW-FIBER DIET? Always check the fiber content on the packaging's Nutrition Facts label, especially on foods from the grains list. Ask your dietitian if you have questions about specific foods that are related to your condition, especially if the food is not listed below. In general, a low-fiber food will have less than 2 g of fiber. WHAT FOODS CAN I EAT? Grains All breads and crackers made with white flour. Sweet rolls, doughnuts, waffles, pancakes, [place] toast, bagels. Pretzels, Melba toast, zwieback. Well-cooked cereals, such as cornmeal, farina, or cream cereals. Dry cereals that do not contain whole grains, fruit, or nuts, such as refined corn, wheat, rice, and oat cereals. Potatoes prepared any way without skins, plain pastas and noodles, refined white rice. Use white flour for baking and making sauces. Use allowed list of grains for casseroles, dumplings, and puddings.  Vegetables Strained  tomato and vegetable juices. Fresh lettuce, cucumber, spinach. Well-cooked (no skin or pulp) or canned vegetables, such as asparagus, bean sprouts, beets, carrots, green beans, mushrooms, potatoes, pumpkin, spinach, yellow squash, tomato sauce/puree, turnips, yams, and zucchini. Keep servings limited to  cup.  Fruits All fruit juices except prune juice. Cooked or canned fruits without skin and seeds, such as applesauce, apricots, cherries, fruit cocktail, grapefruit, grapes, mandarin oranges, melons, peaches, pears, pineapple, and plums. Fresh fruits without skin, such as apricots, avocados, bananas, melons, pineapple, nectarines, and peaches. Keep servings limited to  cup or 1 piece.  Meat and Other Protein Sources Ground or well-cooked tender beef, ham, veal, lamb, pork, or poultry. Eggs, plain cheese. Fish, oysters, shrimp, lobster, and other seafood. Liver, organ meats. Smooth nut butters. Dairy All milk products and alternative dairy substitutes, such as soy, rice, almond, and coconut, not containing added whole nuts, seeds, or added fruit. Beverages Decaf coffee, fruit, and vegetable juices or smoothies (small amounts, with no pulp or skins, and with fruits from allowed list), sports drinks, herbal tea. Condiments Ketchup, mustard, vinegar, cream sauce, cheese sauce, cocoa powder. Spices in moderation, such as allspice, basil, bay leaves, celery powder or leaves, cinnamon, cumin powder, curry powder, ginger, mace, marjoram, onion or  garlic powder, oregano, paprika, parsley flakes, ground pepper, rosemary, sage, savory, tarragon, thyme, and turmeric. Sweets and Desserts Plain cakes and cookies, pie made with allowed fruit, pudding, custard, cream pie. Gelatin, fruit, ice, sherbet, frozen ice pops. Ice cream, ice milk without nuts. Plain hard candy, honey, jelly, molasses, syrup, sugar, chocolate syrup, gumdrops, marshmallows. Limit overall sugar intake.  Fats and Oil Margarine, butter, cream,  mayonnaise, salad oils, plain salad dressings made from allowed foods. Choose healthy fats such as olive oil, canola oil, and omega-3 fatty acids (such as found in salmon or tuna) when possible.  Other Bouillon, broth, or cream soups made from allowed foods. Any strained soup. Casseroles or mixed dishes made with allowed foods. The items listed above may not be a complete list of recommended foods or beverages. Contact your dietitian for more options.  WHAT FOODS ARE NOT RECOMMENDED? Grains All whole wheat and whole grain breads and crackers. Multigrains, rye, bran seeds, nuts, or coconut. Cereals containing whole grains, multigrains, bran, coconut, nuts, raisins. Cooked or dry oatmeal, steel-cut oats. Coarse wheat cereals, granola. Cereals advertised as high fiber. Potato skins. Whole grain pasta, wild or brown rice. Popcorn. Coconut flour. Bran, buckwheat, corn bread, multigrains, rye, wheat germ.  Vegetables Fresh, cooked or canned vegetables, such as artichokes, asparagus, beet greens, broccoli, Brussels sprouts, cabbage, celery, cauliflower, corn, eggplant, kale, legumes or beans, okra, peas, and tomatoes. Avoid large servings of any vegetables, especially raw vegetables.  Fruits Fresh fruits, such as apples with or without skin, berries, cherries, figs, grapes, grapefruit, guavas, kiwis, mangoes, oranges, papayas, pears, persimmons, pineapple, and pomegranate. Prune juice and juices with pulp, stewed or dried prunes. Dried fruits, dates, raisins. Fruit seeds or skins. Avoid large servings of all fresh fruits. Meats and Other Protein Sources Tough, fibrous meats with gristle. Chunky nut butter. Cheese made with seeds, nuts, or other foods not recommended. Nuts, seeds, legumes (beans, including baked beans), dried peas, beans, lentils.  Dairy Yogurt or cheese that contains nuts, seeds, or added fruit.  Beverages Fruit juices with high pulp, prune juice. Caffeinated coffee and teas.   Condiments Coconut, maple syrup, pickles, olives. Sweets and Desserts Desserts, cookies, or candies that contain nuts or coconut, chunky peanut butter, dried fruits. Jams, preserves with seeds, marmalade. Large amounts of sugar and sweets. Any other dessert made with fruits from the not recommended list.  Other Soups made from vegetables that are not recommended or that contain other foods not recommended.  The items listed above may not be a complete list of foods and beverages to avoid. Contact your dietitian for more information.   This information is not intended to replace advice given to you by your health care provider. Make sure you discuss any questions you have with your health care provider.   Document Released: 09/25/2001 Document Revised: 04/10/2013 Document Reviewed: 02/26/2013 Elsevier Interactive Patient Education [employer].

## 2015-09-12 NOTE — Progress Notes (Signed)
[facility] Progress Note     Subjective: 82 y/o female in bed in NAD. Complains of arthritic knee pain and throat pain from NG. Also requests a bath. Pt states she is hungry. Denies abdominal pain and reports decreased distention. Flatus last night, none yet this morning. Loose brown BM this morning on her bed pad at 5:30 am. Denies nausea/vomiting. Only mobilizing with assistance while on pain medication. Patient encouraged to mobilize today with assistance - at baseline she lives at home alone and mobilizes independently, sometimes uses a walker during the night to go to the restroom.  24 h NG: 750 mL, dark green  Objective: Vital signs in last 24 hours: Temp:  [97.7 F (36.5 C)-98.2 F (36.8 C)] 98.2 F (36.8 C) (05/26 0547) Pulse Rate:  [73-77] 77 (05/26 0547) Resp:  [15-16] 16 (05/26 0547) BP: (115-136)/(57-85) 135/85 mmHg (05/26 0547) SpO2:  [94 %-98 %] 95 % (05/26 0547) Weight:  [68.4 kg (150 lb 12.7 oz)] 68.4 kg (150 lb 12.7 oz) (05/26 0442) Last BM Date: 09/10/15  Intake/Output from previous day: 05/25 0701 - 05/26 0700 In: 0  Out: 950 [Urine:200; Emesis/NG output:750] Intake/Output this shift:   PE: Gen:  Alert, NAD, pleasant Head: NG in right nare - 300 mL dark green bile in cannister Card:  RRR, no M/G/R heard Pulm:  CTA, no W/R/R Abd: Soft, NT, mildly distended, +BS, no HSM Ext:  No erythema, edema. Pain in BL knees. Helped patient apply BLUE-EMU to knees. Pedal pulses in tact  Lab Results:   Recent Labs  09/10/15 1914 09/12/15 0417  WBC 12.1* 6.8  HGB 13.1 10.6*  HCT 40.1 32.7*  PLT 268 269   BMET  Recent Labs  09/11/15 0421 09/12/15 0417  NA 137 140  K 3.9 3.5  CL 107 110  CO2 23 24  GLUCOSE 115* 82  BUN 15 14  CREATININE 0.88 0.63  CALCIUM 8.2* 7.9*   PT/INR No results for input(s): LABPROT, INR in the last 72 hours. CMP     Component Value Date/Time   NA 140 09/12/2015 0417   K 3.5 09/12/2015 0417   CL 110 09/12/2015  0417   CO2 24 09/12/2015 0417   GLUCOSE 82 09/12/2015 0417   GLUCOSE 95 02/10/2006 1320   BUN 14 09/12/2015 0417   CREATININE 0.63 09/12/2015 0417   CALCIUM 7.9* 09/12/2015 0417   PROT 7.3 09/10/2015 1914   ALBUMIN 4.1 09/10/2015 1914   AST 18 09/10/2015 1914   ALT 10* 09/10/2015 1914   ALKPHOS 101 09/10/2015 1914   BILITOT 0.9 09/10/2015 1914   GFRNONAA >60 09/12/2015 0417   GFRAA >60 09/12/2015 0417   Lipase     Component Value Date/Time   LIPASE 18 09/10/2015 1914       Studies/Results: Dg Abd 1 View  09/10/2015  CLINICAL DATA:  Nasogastric tube placement. EXAM: ABDOMEN - 1 VIEW COMPARISON:  CT abdomen and pelvis Sep 10, 2015 at 2136 hours FINDINGS: Interval placement of nasogastric tube, distal tip projects in gastric antrum. Multiple loops of gas distended small and large bowel partially imaged. Contrast in the LEFT renal collecting system. Lumbar hardware in place, status post L 5 S1 laminectomies. Lung bases are clear. IMPRESSION: Nasogastric tube tip projects in gastric antrum. Partially imaged gas distended small and large bowel. Electronically Signed   By: Elon Alas M.D.   On: 09/10/2015 23:51   Ct Abdomen Pelvis W Contrast  09/10/2015  CLINICAL DATA:  Abdominal distention with  generalized abdominal pain, nausea, vomiting and diarrhea since yesterday. EXAM: CT ABDOMEN AND PELVIS WITH CONTRAST TECHNIQUE: Multidetector CT imaging of the abdomen and pelvis was performed using the standard protocol following bolus administration of intravenous contrast. CONTRAST:  158mL ISOVUE-300 IOPAMIDOL (ISOVUE-300) INJECTION 61% COMPARISON:  08/08/2006 FINDINGS: Lung bases demonstrate several bilateral pulmonary nodules with slight overall increase in size compared to the pre exams and some which are new. The largest nodule is over the right middle lobe measuring 6 mm. Minimal focal scarring over the right middle lobe. No effusion. Abdominal images demonstrate evidence previous  cholecystectomy. There are a few sub cm liver hypodensities too small to characterize but likely cysts. Minimal prominence of the central intrahepatic ducts likely due to patient's previous cholecystectomy. Common bile duct is within normal. The spleen, pancreas and adrenal glands are within normal. Left kidney is normal. Atrophic multi cystic right kidney unchanged. Ureters are within normal. Minimal calcified plaque of the abdominal aorta. Evidence of previous appendectomy. The stomach is within normal. Air and stool present throughout the colon. There is moderate diverticulosis of the sigmoid colon without active inflammation. There are multiple dilated small bowel loops measuring up to 3.9 cm in diameter. Definite transition point is identified. There is no significant free fluid focal inflammatory change. No evidence of free peritoneal air. Pelvic images demonstrate the bladder and rectum to be within normal. Surgical absence of the uterus. There is fusion hardware over the lumbar spine. Degenerative change of the spine hips. IMPRESSION: Multiple dilated small bowel loops measuring up to 3.9 cm in diameter with air in stool throughout the colon as findings likely represent early/partial small bowel obstruction. No definite transition point as etiology may be adhesions. Colonic diverticulosis without active inflammation. Multiple pulmonary nodules the lung bases with slight interval progression. Largest nodule measures 6 mm over the right middle lobe. Recommend chest CT for complete evaluation of the lungs. Several small liver hypodensities likely cysts or hemangiomas. Chronic atrophic polycystic right kidney. Electronically Signed   By: Marin Olp M.D.   On: 09/10/2015 22:03   Dg Abd Portable 1v-small Bowel Obstruction Protocol-initial, 8 Hr Delay  09/11/2015  CLINICAL DATA:  Small bowel obstruction protocol. Gastrografin given at 1:21 a.m. EXAM: PORTABLE ABDOMEN - 1 VIEW COMPARISON:  Radiographs and CT  09/10/2015. FINDINGS: 1336 hours. Nasogastric tube tip is in the distal stomach or proximal duodenum. Most of the enteric contrast has passed into the colon which is normal in caliber. Mild residual small bowel distention is present. There is contrast material within the bladder attributed to yesterday's CT. No extraluminal contrast or free air identified. Cholecystectomy clips and prior lumbar fusion noted. IMPRESSION: The contrast has extended into the colon, excluding significant small bowel obstruction. Electronically Signed   By: Richardean Sale M.D.   On: 09/11/2015 13:55    Anti-infectives: Anti-infectives    None       Assessment/Plan Incomplete Bowel Obstruction - continue conservative mgmt   - IVF, clamp NG, start clears, antiemetics, analgesics    - encourage ambulation with assistance  FEN - IVF, clears Dispo - floor      Leukocytosis - resolved, 6.8 K/uL  Pulmonary Nodules HTN Hyperlipidemia Arthritis    LOS: 2 days    Jill Alexanders , [facility] 09/12/2015, 8:00 AM Pager: [phone] Mon-Fri 7:00 am-4:30 pm Sat-Sun 7:00 am-11:30 am

## 2015-09-12 NOTE — Progress Notes (Signed)
PROGRESS NOTE    [name]  [id number] DOB: 03-May-1929 DOA: 09/10/2015 PCP: Kathlene November, MD   Brief Narrative: [name] is a pleasant 82 year old female with a past medical history of hysterectomy, cholecystectomy, appendectomy, presented to the emergency department on 09/11/2015 with complaints of abdominal pain associated with distention and nausea and vomiting. She reports having a history of small bowel obstruction as well. Workup in the emergency department included a CT scan of abdomen and pelvis that reveals multiple dilated small bowel loops consistent with partial small bowel obstruction. Suspect partial small bowel obstruction secondary to adhesions. Gen. surgery was consulted as an NG tube was placed.  Assessment & Plan:   Principal Problem:   Small bowel obstruction, partial (HCC) Active Problems:   Hyperlipidemia   Essential hypertension   Pulmonary nodules/lesions, multiple   Leukocytosis   Arthritis   Partial small bowel obstruction ([place])  1.  Partial small bowel obstruction -[name] is a pleasant 82 year old with a past history of multiple abdominal surgeries as well as having a history of small bowel obstruction, presented to the emergency room with complaints of abdominal pain of his associate with distention and nausea/vomiting. -CT imaging of abdomen and pelvis performed in the emergency room revealed findings consistent with partial small bowel obstruction -Suspect adhesions playing a role in the development of partial small bowel obstruction. -NG tube was placed, she is on bowel rest, IV fluid resuscitation, supportive care. -A repeat abdominal x-ray showing contrast extending into the colon. -On 09/12/2015 she reports passing flatus. -Plan to start clears. -Gen. surgery following.   2.  History of hypertension. -She is currently on diltiazem 120 mg by mouth daily  3.  Pulmonary nodules. -CT imaging of abdomen and pelvis revealing multiple  bilateral pulmonary nodules at lung bases. -On 09/12/2015 obtained a CT scan of chest with IV contrast. Radiology reporting bilateral pulmonary nodules, largest measuring 6 mm. Radiology recommending a repeat noncontrasted study at 3-6 months.   DVT prophylaxis: Subcutaneous heparin Code Status: Full code Family Communication:  Disposition Plan: Anticipate discharge home once medically stable the next 48-72 hours  Consultants:   General surgery  Subjective: She reports feeling better today, she had related down the hallway, did well. Reports passing flatus. Also thinks abdominal distention is improving.  Objective: Filed Vitals:   09/11/15 1300 09/11/15 2203 09/12/15 0442 09/12/15 0547  BP: 115/60 136/57  135/85  Pulse: 73 74  77  Temp: 97.8 F (36.6 C) 97.7 F (36.5 C)  98.2 F (36.8 C)  TempSrc: Oral Oral  Oral  Resp: 15 16  16   Height:      Weight:   68.4 kg (150 lb 12.7 oz)   SpO2: 94% 98%  95%    Intake/Output Summary (Last 24 hours) at 09/12/15 1426 Last data filed at 09/12/15 0936  Gross per 24 hour  Intake      0 ml  Output    400 ml  Net   -400 ml   Filed Weights   09/11/15 0012 09/12/15 0442  Weight: 68.811 kg (151 lb 11.2 oz) 68.4 kg (150 lb 12.7 oz)    Examination:  General exam: Appears calm and comfortable, NG tube in place. Ambulate down the hallway. Respiratory system: Clear to auscultation. Respiratory effort normal. Cardiovascular system: S1 & S2 heard, RRR. No JVD, murmurs, rubs, gallops or clicks. No pedal edema. Gastrointestinal system: Interim improvement to abdominal exam which is less distended, nontender. Central nervous system: Alert and oriented. No focal  neurological deficits. Extremities: Symmetric 5 x 5 power. Skin: No rashes, lesions or ulcers Psychiatry: Judgement and insight appear normal. Mood & affect appropriate.     Data Reviewed: I have personally reviewed following labs and imaging studies  CBC:  Recent Labs Lab  09/10/15 1914 09/12/15 0417  WBC 12.1* 6.8  HGB 13.1 10.6*  HCT 40.1 32.7*  MCV 96.6 97.0  PLT 268 [id number]   Basic Metabolic Panel:  Recent Labs Lab 09/10/15 1914 09/11/15 0421 09/12/15 0417  NA 136 137 140  K 4.1 3.9 3.5  CL 104 107 110  CO2 24 23 24   GLUCOSE 117* 115* 82  BUN 16 15 14   CREATININE 0.92 0.88 0.63  CALCIUM 9.2 8.2* 7.9*   GFR: Estimated Creatinine Clearance: 50 mL/min (by C-G formula based on Cr of 0.63). Liver Function Tests:  Recent Labs Lab 09/10/15 1914  AST 18  ALT 10*  ALKPHOS 101  BILITOT 0.9  PROT 7.3  ALBUMIN 4.1    Recent Labs Lab 09/10/15 1914  LIPASE 18   No results for input(s): AMMONIA in the last 168 hours. Coagulation Profile: No results for input(s): INR, PROTIME in the last 168 hours. Cardiac Enzymes: No results for input(s): CKTOTAL, CKMB, CKMBINDEX, TROPONINI in the last 168 hours. BNP (last 3 results) No results for input(s): PROBNP in the last 8760 hours. HbA1C: No results for input(s): HGBA1C in the last 72 hours. CBG:  Recent Labs Lab 09/11/15 0816 09/12/15 0807  GLUCAP 120* 82   Lipid Profile: No results for input(s): CHOL, HDL, LDLCALC, TRIG, CHOLHDL, LDLDIRECT in the last 72 hours. Thyroid Function Tests: No results for input(s): TSH, T4TOTAL, FREET4, T3FREE, THYROIDAB in the last 72 hours. Anemia Panel: No results for input(s): VITAMINB12, FOLATE, FERRITIN, TIBC, IRON, RETICCTPCT in the last 72 hours. Sepsis Labs:  Recent Labs Lab 09/10/15 2033 09/10/15 2244  LATICACIDVEN 1.55 1.60    No results found for this or any previous visit (from the past 240 hour(s)).       Radiology Studies: Dg Abd 1 View  09/10/2015  CLINICAL DATA:  Nasogastric tube placement. EXAM: ABDOMEN - 1 VIEW COMPARISON:  CT abdomen and pelvis Sep 10, 2015 at 2136 hours FINDINGS: Interval placement of nasogastric tube, distal tip projects in gastric antrum. Multiple loops of gas distended small and large bowel partially  imaged. Contrast in the LEFT renal collecting system. Lumbar hardware in place, status post L 5 S1 laminectomies. Lung bases are clear. IMPRESSION: Nasogastric tube tip projects in gastric antrum. Partially imaged gas distended small and large bowel. Electronically Signed   By: Elon Alas M.D.   On: 09/10/2015 23:51   Ct Chest W Contrast  09/12/2015  CLINICAL DATA:  Evaluate pulmonary nodules EXAM: CT CHEST WITH CONTRAST TECHNIQUE: Multidetector CT imaging of the chest was performed during intravenous contrast administration. CONTRAST:  87mL ISOVUE-300 IOPAMIDOL (ISOVUE-300) INJECTION 61% COMPARISON:  09/10/2015 FINDINGS: Mediastinum/Lymph Nodes: There is NG tube in place with tip in duodenum. Heart size within normal limits. Trace anterior pericardial effusion measures 4 mm thickness. Central pulmonary artery and thoracic aorta is unremarkable. No mediastinal hematoma or adenopathy. No hilar adenopathy. Lungs/Pleura: There is no pleural thickening or pleural effusion. Images of the lung parenchyma shows no acute infiltrate or pulmonary edema. Again noted at least 3 nodules in right middle lobe the largest measures 6 mm. Two adjacent nodules are noted in lingula the largest measures 4 mm. At least 2 nodules are noted in right lower lobe the largest  measures 3 mm. Scattered go small nodules are noted in left lower lobe the largest measures 4 mm axial image 87. Largest nodule in superior segment of the right lower lobe axial image 44 measures 4 mm. Nodule in right upper lobe axial image 42 measures 5.5 mm. Upper abdomen: Again noted small low-density lesions probable cysts within liver. The visualized spleen is unremarkable. No adrenal gland mass is noted. Musculoskeletal: No destructive bony lesions are noted. Sagittal images of the spine shows multilevel degenerative changes thoracic spine with disc space flattening with vacuum disc phenomenon. Sagittal view of the sternum is unremarkable. IMPRESSION: 1.  There are scattered bilateral pulmonary nodules as described above. The largest in right middle lobe measures 6 mm. Non-contrast chest CT at 3-6 months is recommended. If the nodules are stable at time of repeat CT, then future CT at 18-24 months (from today's scan) is considered optional for low-risk patients, but is recommended for high-risk patients. This recommendation follows the consensus statement: Guidelines for Management of Incidental Pulmonary Nodules Detected on CT Images:From the Fleischner Society 2017; published online before print (10.1148/radiol.IJ:2314499). 2. Degenerative changes thoracic spine. 3. NG tube in place. 4. No infiltrate or pulmonary edema. 5. No mediastinal hematoma or adenopathy. 6. No adrenal gland mass is noted. Electronically Signed   By: Lahoma Crocker M.D.   On: 09/12/2015 10:50   Ct Abdomen Pelvis W Contrast  09/10/2015  CLINICAL DATA:  Abdominal distention with generalized abdominal pain, nausea, vomiting and diarrhea since yesterday. EXAM: CT ABDOMEN AND PELVIS WITH CONTRAST TECHNIQUE: Multidetector CT imaging of the abdomen and pelvis was performed using the standard protocol following bolus administration of intravenous contrast. CONTRAST:  175mL ISOVUE-300 IOPAMIDOL (ISOVUE-300) INJECTION 61% COMPARISON:  08/08/2006 FINDINGS: Lung bases demonstrate several bilateral pulmonary nodules with slight overall increase in size compared to the pre exams and some which are new. The largest nodule is over the right middle lobe measuring 6 mm. Minimal focal scarring over the right middle lobe. No effusion. Abdominal images demonstrate evidence previous cholecystectomy. There are a few sub cm liver hypodensities too small to characterize but likely cysts. Minimal prominence of the central intrahepatic ducts likely due to patient's previous cholecystectomy. Common bile duct is within normal. The spleen, pancreas and adrenal glands are within normal. Left kidney is normal. Atrophic multi  cystic right kidney unchanged. Ureters are within normal. Minimal calcified plaque of the abdominal aorta. Evidence of previous appendectomy. The stomach is within normal. Air and stool present throughout the colon. There is moderate diverticulosis of the sigmoid colon without active inflammation. There are multiple dilated small bowel loops measuring up to 3.9 cm in diameter. Definite transition point is identified. There is no significant free fluid focal inflammatory change. No evidence of free peritoneal air. Pelvic images demonstrate the bladder and rectum to be within normal. Surgical absence of the uterus. There is fusion hardware over the lumbar spine. Degenerative change of the spine hips. IMPRESSION: Multiple dilated small bowel loops measuring up to 3.9 cm in diameter with air in stool throughout the colon as findings likely represent early/partial small bowel obstruction. No definite transition point as etiology may be adhesions. Colonic diverticulosis without active inflammation. Multiple pulmonary nodules the lung bases with slight interval progression. Largest nodule measures 6 mm over the right middle lobe. Recommend chest CT for complete evaluation of the lungs. Several small liver hypodensities likely cysts or hemangiomas. Chronic atrophic polycystic right kidney. Electronically Signed   By: Marin Olp M.D.   On:  09/10/2015 22:03   Dg Abd Portable 1v-small Bowel Obstruction Protocol-initial, 8 Hr Delay  09/11/2015  CLINICAL DATA:  Small bowel obstruction protocol. Gastrografin given at 1:21 a.m. EXAM: PORTABLE ABDOMEN - 1 VIEW COMPARISON:  Radiographs and CT 09/10/2015. FINDINGS: 1336 hours. Nasogastric tube tip is in the distal stomach or proximal duodenum. Most of the enteric contrast has passed into the colon which is normal in caliber. Mild residual small bowel distention is present. There is contrast material within the bladder attributed to yesterday's CT. No extraluminal contrast or  free air identified. Cholecystectomy clips and prior lumbar fusion noted. IMPRESSION: The contrast has extended into the colon, excluding significant small bowel obstruction. Electronically Signed   By: Richardean Sale M.D.   On: 09/11/2015 13:55    Scheduled Meds: . acidophilus  1 capsule Oral Daily  . aspirin EC  81 mg Oral QHS  . atorvastatin  40 mg Oral QHS  . diltiazem  120 mg Oral Daily  . famotidine (PEPCID) IV  20 mg Intravenous QHS  . heparin  5,000 Units Subcutaneous Q8H  . multivitamin with minerals  1 tablet Oral Daily   Continuous Infusions:    LOS: 2 days    Time spent: 25 min   Kelvin Cellar, MD [facility] Pager [phone]  If 7PM-7AM, please contact night-coverage [email or web address] Password [email or web address] 09/12/2015, 2:26 PM

## 2015-09-13 LAB — BASIC METABOLIC PANEL
ANION GAP: 6 (ref 5–15)
BUN: 8 mg/dL (ref 6–20)
CHLORIDE: 106 mmol/L (ref 101–111)
CO2: 24 mmol/L (ref 22–32)
Calcium: 8 mg/dL — ABNORMAL LOW (ref 8.9–10.3)
Creatinine, Ser: 0.64 mg/dL (ref 0.44–1.00)
GFR calc Af Amer: >60 mL/min (ref >60)
Glucose, Bld: 103 mg/dL — ABNORMAL HIGH (ref 65–99)
POTASSIUM: 3.6 mmol/L (ref 3.5–5.1)
SODIUM: 136 mmol/L (ref 135–145)

## 2015-09-13 LAB — CBC
HEMATOCRIT: 32 % — AB (ref 36.0–46.0)
HEMOGLOBIN: 10.6 g/dL — AB (ref 12.0–15.0)
MCH: 31.7 pg (ref 26.0–34.0)
MCHC: 33.1 g/dL (ref 30.0–36.0)
MCV: 95.8 fL (ref 78.0–100.0)
Platelets: 296 10*3/uL (ref 150–400)
RBC: 3.34 MIL/uL — AB (ref 3.87–5.11)
RDW: 13.3 % (ref 11.5–15.5)
WBC: 7.4 10*3/uL (ref 4.0–10.5)

## 2015-09-13 LAB — GLUCOSE, CAPILLARY: Glucose-Capillary: 110 mg/dL — ABNORMAL HIGH (ref 65–99)

## 2015-09-13 NOTE — Progress Notes (Signed)
Patient ID: [name], female   DOB: [birth year], 82 y.o.   MRN: [id number] [facility] Surgery Progress Note:   * No surgery found *  Subjective: Mental status is clear.  Up walking about without complaints.  Passing flatus and taking a pureed diet.  Objective: Vital signs in last 24 hours: Temp:  [98.1 F (36.7 C)-98.8 F (37.1 C)] 98.1 F (36.7 C) (05/27 0510) Pulse Rate:  [64-91] 64 (05/27 0510) Resp:  [16-18] 16 (05/27 0510) BP: (132-135)/(65-69) 132/65 mmHg (05/27 0510) SpO2:  [96 %-97 %] 96 % (05/27 0510)  Intake/Output from previous day:   Intake/Output this shift:    Physical Exam: Work of breathing is not labored.  Not complaining of abdominal pain  Lab Results:  Results for orders placed or performed during the hospital encounter of 09/10/15 (from the past 48 hour(s))  Basic metabolic panel     Status: Abnormal   Collection Time: 09/12/15  4:17 AM  Result Value Ref Range   Sodium 140 135 - 145 mmol/L   Potassium 3.5 3.5 - 5.1 mmol/L   Chloride 110 101 - 111 mmol/L   CO2 24 22 - 32 mmol/L   Glucose, Bld 82 65 - 99 mg/dL   BUN 14 6 - 20 mg/dL   Creatinine, Ser 0.63 0.44 - 1.00 mg/dL   Calcium 7.9 (L) 8.9 - 10.3 mg/dL   GFR calc non Af Amer >60 >60 mL/min   GFR calc Af Amer >60 >60 mL/min    Comment: (NOTE) The eGFR has been calculated using the CKD EPI equation. This calculation has not been validated in all clinical situations. eGFR's persistently <60 mL/min signify possible Chronic Kidney Disease.    Anion gap 6 5 - 15  CBC     Status: Abnormal   Collection Time: 09/12/15  4:17 AM  Result Value Ref Range   WBC 6.8 4.0 - 10.5 K/uL   RBC 3.37 (L) 3.87 - 5.11 MIL/uL   Hemoglobin 10.6 (L) 12.0 - 15.0 g/dL   HCT 32.7 (L) 36.0 - 46.0 %   MCV 97.0 78.0 - 100.0 fL   MCH 31.5 26.0 - 34.0 pg   MCHC 32.4 30.0 - 36.0 g/dL   RDW 13.6 11.5 - 15.5 %   Platelets 269 150 - 400 K/uL  Glucose, capillary     Status: None   Collection Time: 09/12/15  8:07 AM   Result Value Ref Range   Glucose-Capillary 82 65 - 99 mg/dL  Basic metabolic panel     Status: Abnormal   Collection Time: 09/13/15  4:49 AM  Result Value Ref Range   Sodium 136 135 - 145 mmol/L   Potassium 3.6 3.5 - 5.1 mmol/L   Chloride 106 101 - 111 mmol/L   CO2 24 22 - 32 mmol/L   Glucose, Bld 103 (H) 65 - 99 mg/dL   BUN 8 6 - 20 mg/dL   Creatinine, Ser 0.64 0.44 - 1.00 mg/dL   Calcium 8.0 (L) 8.9 - 10.3 mg/dL   GFR calc non Af Amer >60 >60 mL/min   GFR calc Af Amer >60 >60 mL/min    Comment: (NOTE) The eGFR has been calculated using the CKD EPI equation. This calculation has not been validated in all clinical situations. eGFR's persistently <60 mL/min signify possible Chronic Kidney Disease.    Anion gap 6 5 - 15  CBC     Status: Abnormal   Collection Time: 09/13/15  4:49 AM  Result Value Ref Range  WBC 7.4 4.0 - 10.5 K/uL   RBC 3.34 (L) 3.87 - 5.11 MIL/uL   Hemoglobin 10.6 (L) 12.0 - 15.0 g/dL   HCT 32.0 (L) 36.0 - 46.0 %   MCV 95.8 78.0 - 100.0 fL   MCH 31.7 26.0 - 34.0 pg   MCHC 33.1 30.0 - 36.0 g/dL   RDW 13.3 11.5 - 15.5 %   Platelets 296 150 - 400 K/uL  Glucose, capillary     Status: Abnormal   Collection Time: 09/13/15  8:14 AM  Result Value Ref Range   Glucose-Capillary 110 (H) 65 - 99 mg/dL    Radiology/Results: Ct Chest W Contrast  09/12/2015  CLINICAL DATA:  Evaluate pulmonary nodules EXAM: CT CHEST WITH CONTRAST TECHNIQUE: Multidetector CT imaging of the chest was performed during intravenous contrast administration. CONTRAST:  18m ISOVUE-300 IOPAMIDOL (ISOVUE-300) INJECTION 61% COMPARISON:  09/10/2015 FINDINGS: Mediastinum/Lymph Nodes: There is NG tube in place with tip in duodenum. Heart size within normal limits. Trace anterior pericardial effusion measures 4 mm thickness. Central pulmonary artery and thoracic aorta is unremarkable. No mediastinal hematoma or adenopathy. No hilar adenopathy. Lungs/Pleura: There is no pleural thickening or pleural  effusion. Images of the lung parenchyma shows no acute infiltrate or pulmonary edema. Again noted at least 3 nodules in right middle lobe the largest measures 6 mm. Two adjacent nodules are noted in lingula the largest measures 4 mm. At least 2 nodules are noted in right lower lobe the largest measures 3 mm. Scattered go small nodules are noted in left lower lobe the largest measures 4 mm axial image 87. Largest nodule in superior segment of the right lower lobe axial image 44 measures 4 mm. Nodule in right upper lobe axial image 42 measures 5.5 mm. Upper abdomen: Again noted small low-density lesions probable cysts within liver. The visualized spleen is unremarkable. No adrenal gland mass is noted. Musculoskeletal: No destructive bony lesions are noted. Sagittal images of the spine shows multilevel degenerative changes thoracic spine with disc space flattening with vacuum disc phenomenon. Sagittal view of the sternum is unremarkable. IMPRESSION: 1. There are scattered bilateral pulmonary nodules as described above. The largest in right middle lobe measures 6 mm. Non-contrast chest CT at 3-6 months is recommended. If the nodules are stable at time of repeat CT, then future CT at 18-24 months (from today's scan) is considered optional for low-risk patients, but is recommended for high-risk patients. This recommendation follows the consensus statement: Guidelines for Management of Incidental Pulmonary Nodules Detected on CT Images:From the Fleischner Society 2017; published online before print (10.1148/[id number]. 2. Degenerative changes thoracic spine. 3. NG tube in place. 4. No infiltrate or pulmonary edema. 5. No mediastinal hematoma or adenopathy. 6. No adrenal gland mass is noted. Electronically Signed   By: LLahoma CrockerM.D.   On: 09/12/2015 10:50   Dg Abd Portable 1v-small Bowel Obstruction Protocol-initial, 8 Hr Delay  09/11/2015  CLINICAL DATA:  Small bowel obstruction protocol. Gastrografin given at  1:21 a.m. EXAM: PORTABLE ABDOMEN - 1 VIEW COMPARISON:  Radiographs and CT 09/10/2015. FINDINGS: 1336 hours. Nasogastric tube tip is in the distal stomach or proximal duodenum. Most of the enteric contrast has passed into the colon which is normal in caliber. Mild residual small bowel distention is present. There is contrast material within the bladder attributed to yesterday's CT. No extraluminal contrast or free air identified. Cholecystectomy clips and prior lumbar fusion noted. IMPRESSION: The contrast has extended into the colon, excluding significant small bowel obstruction. Electronically Signed  By: Richardean Sale M.D.   On: 09/11/2015 13:55    Anti-infectives: Anti-infectives    None      Assessment/Plan: Problem List: Patient Active Problem List   Diagnosis Date Noted  . Abdominal pain 09/10/2015  . Pulmonary nodules/lesions, multiple 09/10/2015  . Small bowel obstruction, partial ([place]) 09/10/2015  . Leukocytosis 09/10/2015  . Arthritis 09/10/2015  . Partial small bowel obstruction ([place]) 09/10/2015  . PCP NOTES >>>>>> 04/17/2015  . Hemorrhoids 09/17/2011  . Annual physical exam 03/19/2011  . DJD -back pain-on Celebrex   . Cough 09/25/2010  . Constipation 01/21/2010  . DIVERTICULOSIS OF COLON 09/02/2009  . PERSONAL HISTORY OF COLONIC POLYPS 09/02/2009  . FULL INCONTINENCE OF FECES 09/02/2009  . Essential hypertension 08/10/2007  . Headache 08/10/2007  . Anxiety state 02/03/2007  . Depression 02/03/2007  . KNEE PAIN, RIGHT, CHRONIC 02/03/2007  . Hyperlipidemia 04/26/2006  . DIZZINESS 04/26/2006  . SUPRAVENTRICULAR TACHYCARDIA, HX OF 04/26/2006    Patient asked about discharge.  From surgery standpoint, OK to discharge on pureed diet.   * No surgery found *    LOS: 3 days   Matt B. Hassell Done, MD, [facility] [phone] beeper [phone]  09/13/2015 9:39 AM

## 2015-09-13 NOTE — Progress Notes (Signed)
Assessment unchanged. Independent with ADL's. No complaints voiced except for lingering HA stating "i'll take my Tylenol Sinus when I get home. It's the only thing that helps." Pt verbalized understanding of dc instructions through teach back including follow up care and when to call the doctor. No scripts at dc. Discharged via wc to ED entrance accompanied by NT.

## 2015-09-13 NOTE — Progress Notes (Signed)
Pt states that she usually takes Neurontin for her sciatic pain. Managed pain with prns through the night but will report to oncoming shift to discuss with MD.

## 2015-09-17 ENCOUNTER — Telehealth: Payer: Self-pay | Admitting: Behavioral Health

## 2015-09-17 NOTE — Telephone Encounter (Signed)
Transition Care Management Follow-up Telephone Call   Date discharged? 09/13/15   How have you been since you were released from the hospital? Patient stated, "I've been without pain in my abdomen, but now trying to get my strength back".   Do you understand why you were in the hospital? yes   Do you understand the discharge instructions? yes   Where were you discharged to? Home   Items Reviewed:  Medications reviewed: yes  Allergies reviewed: yes  Dietary changes reviewed: yes, patient reported a low-fiber diet for 1-2 weeks, followed by a high-fiber diet.  Referrals reviewed: yes, follow-up with PCP.   Functional Questionnaire:   Activities of Daily Living (ADLs):   She states they are independent in the following: ambulation, bathing and hygiene, feeding, continence, grooming, toileting and dressing States they require assistance with the following: None   Any transportation issues/concerns?: no   Any patient concerns? no   Confirmed importance and date/time of follow-up visits scheduled yes, 09/26/15 at 11:30 AM.  Provider Appointment booked with Dr. Larose Kells.  Confirmed with patient if condition begins to worsen call PCP or go to the ER.  Patient was given the office number and encouraged to call back with question or concerns.  : yes

## 2015-09-24 NOTE — Discharge Summary (Signed)
Physician Discharge Summary  [name] [id number] DOB: 18-Dec-1929 DOA: 09/10/2015  PCP: Kathlene November, MD  Admit date: 09/10/2015 Discharge date: 09/13/2015  Time spent: 35 minutes  Recommendations for Outpatient Follow-up:  1. Patient admitted for small bowel obstruction improved with conservative management. Please follow 2. Please follow-up on repeat CT scan of chest in 3-6 months for follow-up of pulmonary nodules.   Discharge Diagnoses:  Principal Problem:   Small bowel obstruction, partial ([place]) Active Problems:   Hyperlipidemia   Essential hypertension   Pulmonary nodules/lesions, multiple   Leukocytosis   Arthritis   Partial small bowel obstruction (HCC)   Discharge Condition: Stable/improved  Diet recommendation: Regular diet  Filed Weights   09/11/15 0012 09/12/15 0442  Weight: 68.811 kg (151 lb 11.2 oz) 68.4 kg (150 lb 12.7 oz)    History of present illness:  [name] is a 82 y.o. female with medical history significant for hypertension, hyperlipidemia, osteoarthritis, and pulmonary nodules who presents to the ED with 1 day of generalized abdominal pain with distention and nausea, vomiting, and loose stools. Patient reports pain in her usual state of health until yesterday afternoon when she experienced acute onset of abdominal pain with nausea and vomiting. Symptoms have continued to progress since that time and abdominal distention has developed. She reports passing several loose stools since onset of symptoms, with most recent several hours ago. She continues to pass flatus. She denies any hematemesis, melena, or hematochezia. She has experienced similar symptoms previously which necessitated adhesiolysis approximately 10 years ago. She has remote history of abdominal hysterectomy, appendectomy, and cholecystectomy. She denies any fevers, chills, chest pain, or palpitations. She denies any dysuria or increased urinary urgency or frequency.  Hospital  Course:  [name] is a pleasant 82 year old female with a past medical history of hysterectomy, cholecystectomy, appendectomy, presented to the emergency department on 09/11/2015 with complaints of abdominal pain associated with distention and nausea and vomiting. She reports having a history of small bowel obstruction as well. Workup in the emergency department included a CT scan of abdomen and pelvis that reveals multiple dilated small bowel loops consistent with partial small bowel obstruction. Suspect partial small bowel obstruction secondary to adhesions. Gen. surgery was consulted as an NG tube was placed.  1. Partial small bowel obstruction -[name] is a pleasant 82 year old with a past history of multiple abdominal surgeries as well as having a history of small bowel obstruction, presented to the emergency room with complaints of abdominal pain associated with distention and nausea/vomiting. -CT imaging of abdomen and pelvis performed in the emergency room revealed findings consistent with partial small bowel obstruction -Suspect adhesions playing a role in the development of partial small bowel obstruction. -NG tube was placed, she is on bowel rest, IV fluid resuscitation, supportive care. -A repeat abdominal x-ray showing contrast extending into the colon. -On 09/12/2015 she reports passing flatus. -By 09/13/2015 she had shown significant clinical improvement, was tolerating by mouth intake, and bleeding down the hallway. Dr Hassell Done felt she was stable for discharge from a surgical standpoint.  2. History of hypertension. -Discharged on Cardizem 120 mg by mouth daily  3. Pulmonary nodules. -CT imaging of abdomen and pelvis revealing multiple bilateral pulmonary nodules at lung bases. -On 09/12/2015 obtained a CT scan of chest with IV contrast. Radiology reporting bilateral pulmonary nodules, largest measuring 6 mm. Radiology recommending a repeat noncontrasted study at 3-6 months.     Consultations:  Dr. Hassell Done of general surgery  Discharge Exam: Filed  Vitals:   09/13/15 0510 09/13/15 0900  BP: 132/65 124/61  Pulse: 64 75  Temp: 98.1 F (36.7 C) 98.6 F (37 C)  Resp: 16 16    General exam: Appears calm and comfortable, NG tube in place. Ambulate down the hallway. Respiratory system: Clear to auscultation. Respiratory effort normal. Cardiovascular system: S1 & S2 heard, RRR. No JVD, murmurs, rubs, gallops or clicks. No pedal edema. Gastrointestinal system: Interim improvement to abdominal exam which is less distended, nontender. Central nervous system: Alert and oriented. No focal neurological deficits. Extremities: Symmetric 5 x 5 power. Skin: No rashes, lesions or ulcers Psychiatry: Judgement and insight appear normal. Mood & affect appropriate.   Discharge Instructions   Discharge Instructions    Call MD for:  difficulty breathing, headache or visual disturbances    Complete by:  As directed      Call MD for:  extreme fatigue    Complete by:  As directed      Call MD for:  hives    Complete by:  As directed      Call MD for:  persistant dizziness or light-headedness    Complete by:  As directed      Call MD for:  persistant nausea and vomiting    Complete by:  As directed      Call MD for:  redness, tenderness, or signs of infection (pain, swelling, redness, odor or green/yellow discharge around incision site)    Complete by:  As directed      Call MD for:  severe uncontrolled pain    Complete by:  As directed      Call MD for:  temperature >100.4    Complete by:  As directed      Call MD for:    Complete by:  As directed      Diet - low sodium heart healthy    Complete by:  As directed      Increase activity slowly    Complete by:  As directed           Discharge Medication List as of 09/13/2015 11:14 AM    CONTINUE these medications which have NOT CHANGED   Details  acetaminophen (TYLENOL) 500 MG tablet Take 500 mg by mouth every  6 (six) hours as needed for moderate pain., Until Discontinued, Historical Med    Ascorbic Acid (VITAMIN C PO) Take 1 tablet by mouth 2 (two) times daily. , Until Discontinued, Historical Med    aspirin 81 MG tablet Take 81 mg by mouth at bedtime. , Until Discontinued, Historical Med    atorvastatin (LIPITOR) 40 MG tablet Take 1 tablet (40 mg total) by mouth at bedtime., Starting 06/10/2015, Until Discontinued, Normal    celecoxib (CELEBREX) 200 MG capsule Take 200 mg by mouth daily., Until Discontinued, Historical Med    desoximetasone (TOPICORT) [id number] % cream 1 APPLICATION APPLY ON THE SKIN TWICE A DAY APPLY TO RASH ON HANDS TWICE DAILY AS NEEDED FOR RASH, Historical Med    diltiazem (CARDIZEM CD) 120 MG 24 hr capsule Take 1 capsule (120 mg total) by mouth daily., Starting 04/24/2015, Until Discontinued, Normal    gabapentin (NEURONTIN) 100 MG capsule Take 200-300 mg by mouth 3 (three) times daily as needed (pain). , Until Discontinued, Historical Med    multivitamin (THERAGRAN) per tablet Take 1 tablet by mouth daily.  , Until Discontinued, Historical Med    NON FORMULARY Take 2 capsules by mouth 2 (two) times daily. [place], Until  Discontinued, Historical Med    Probiotic Product (PROBIOTIC DAILY PO) Take 1 capsule by mouth daily. , Until Discontinued, Historical Med    pseudoephedrine-acetaminophen (TYLENOL SINUS) 30-500 MG TABS tablet Take 1 tablet by mouth every 4 (four) hours as needed (sinus congestion)., Until Discontinued, Historical Med    tamsulosin (FLOMAX) 0.4 MG CAPS capsule Take 0.4 mg by mouth once a week., Until Discontinued, Historical Med    tolterodine (DETROL LA) 4 MG 24 hr capsule Take 4 mg by mouth daily., Starting 08/27/2015, Until Discontinued, Historical Med    alendronate (FOSAMAX) 70 MG tablet Take 1 tablet (70 mg total) by mouth every 7 (seven) days. Take with a full glass of water on an empty stomach., Starting 07/07/2015, Until Discontinued, Normal     azelastine (ASTELIN) 0.1 % nasal spray Place 2 sprays into both nostrils 2 (two) times daily., Starting 08/17/2013, Until Discontinued, Normal    calcium gluconate 500 MG tablet Take 500 mg by mouth 2 (two) times daily. Reported on 07/04/2015, Until Discontinued, Historical Med       Allergies  Allergen Reactions  . Amitriptyline Hcl     REACTION: hallucinations  . Carisoprodol     REACTION: nightmares,rash,tachycardia  . Codeine     REACTION: "deathly ill" nauesa, dizzy,weak  . Meloxicam     REACTION: sensitive to sun; facial redness; may cause severe abd pain  . Metronidazole     REACTION: abd pain  . Nortriptyline Hcl     REACTION: hallucinations  . Oxaprozin     REACTION: abd pain  . Promethazine Hcl     REACTION: spastic arms and legs flailing while awake and unusual behavior while sleep - walking all over house during night  . Rofecoxib     REACTION: rash  . Sumatriptan     REACTION: rash  . Tramadol Other (See Comments)    Dizziness and constipation   Follow-up Information    Call [facility].   Why:  As needed, but there is no need to follow-up with our office after this hospital admission.   Contact information:   [address] [place] [phone]       Follow up with Kathlene November, MD In 2 weeks.   Specialty:  Internal Medicine   Contact information:   [address] [place] [address] [phone]        The results of significant diagnostics from this hospitalization (including imaging, microbiology, ancillary and laboratory) are listed below for reference.    Significant Diagnostic Studies: Dg Abd 1 View  09/10/2015  CLINICAL DATA:  Nasogastric tube placement. EXAM: ABDOMEN - 1 VIEW COMPARISON:  CT abdomen and pelvis Sep 10, 2015 at 2136 hours FINDINGS: Interval placement of nasogastric tube, distal tip projects in gastric antrum. Multiple loops of gas distended small and large  bowel partially imaged. Contrast in the LEFT renal collecting system. Lumbar hardware in place, status post L 5 S1 laminectomies. Lung bases are clear. IMPRESSION: Nasogastric tube tip projects in gastric antrum. Partially imaged gas distended small and large bowel. Electronically Signed   By: Elon Alas M.D.   On: 09/10/2015 23:51   Ct Chest W Contrast  09/12/2015  CLINICAL DATA:  Evaluate pulmonary nodules EXAM: CT CHEST WITH CONTRAST TECHNIQUE: Multidetector CT imaging of the chest was performed during intravenous contrast administration. CONTRAST:  33mL ISOVUE-300 IOPAMIDOL (ISOVUE-300) INJECTION 61% COMPARISON:  09/10/2015 FINDINGS: Mediastinum/Lymph Nodes: There is NG tube  in place with tip in duodenum. Heart size within normal limits. Trace anterior pericardial effusion measures 4 mm thickness. Central pulmonary artery and thoracic aorta is unremarkable. No mediastinal hematoma or adenopathy. No hilar adenopathy. Lungs/Pleura: There is no pleural thickening or pleural effusion. Images of the lung parenchyma shows no acute infiltrate or pulmonary edema. Again noted at least 3 nodules in right middle lobe the largest measures 6 mm. Two adjacent nodules are noted in lingula the largest measures 4 mm. At least 2 nodules are noted in right lower lobe the largest measures 3 mm. Scattered go small nodules are noted in left lower lobe the largest measures 4 mm axial image 87. Largest nodule in superior segment of the right lower lobe axial image 44 measures 4 mm. Nodule in right upper lobe axial image 42 measures 5.5 mm. Upper abdomen: Again noted small low-density lesions probable cysts within liver. The visualized spleen is unremarkable. No adrenal gland mass is noted. Musculoskeletal: No destructive bony lesions are noted. Sagittal images of the spine shows multilevel degenerative changes thoracic spine with disc space flattening with vacuum disc phenomenon. Sagittal view of the sternum is unremarkable.  IMPRESSION: 1. There are scattered bilateral pulmonary nodules as described above. The largest in right middle lobe measures 6 mm. Non-contrast chest CT at 3-6 months is recommended. If the nodules are stable at time of repeat CT, then future CT at 18-24 months (from today's scan) is considered optional for low-risk patients, but is recommended for high-risk patients. This recommendation follows the consensus statement: Guidelines for Management of Incidental Pulmonary Nodules Detected on CT Images:From the Fleischner Society 2017; published online before print (10.1148/radiol.IJ:2314499). 2. Degenerative changes thoracic spine. 3. NG tube in place. 4. No infiltrate or pulmonary edema. 5. No mediastinal hematoma or adenopathy. 6. No adrenal gland mass is noted. Electronically Signed   By: Lahoma Crocker M.D.   On: 09/12/2015 10:50   Ct Abdomen Pelvis W Contrast  09/10/2015  CLINICAL DATA:  Abdominal distention with generalized abdominal pain, nausea, vomiting and diarrhea since yesterday. EXAM: CT ABDOMEN AND PELVIS WITH CONTRAST TECHNIQUE: Multidetector CT imaging of the abdomen and pelvis was performed using the standard protocol following bolus administration of intravenous contrast. CONTRAST:  148mL ISOVUE-300 IOPAMIDOL (ISOVUE-300) INJECTION 61% COMPARISON:  08/08/2006 FINDINGS: Lung bases demonstrate several bilateral pulmonary nodules with slight overall increase in size compared to the pre exams and some which are new. The largest nodule is over the right middle lobe measuring 6 mm. Minimal focal scarring over the right middle lobe. No effusion. Abdominal images demonstrate evidence previous cholecystectomy. There are a few sub cm liver hypodensities too small to characterize but likely cysts. Minimal prominence of the central intrahepatic ducts likely due to patient's previous cholecystectomy. Common bile duct is within normal. The spleen, pancreas and adrenal glands are within normal. Left kidney is normal.  Atrophic multi cystic right kidney unchanged. Ureters are within normal. Minimal calcified plaque of the abdominal aorta. Evidence of previous appendectomy. The stomach is within normal. Air and stool present throughout the colon. There is moderate diverticulosis of the sigmoid colon without active inflammation. There are multiple dilated small bowel loops measuring up to 3.9 cm in diameter. Definite transition point is identified. There is no significant free fluid focal inflammatory change. No evidence of free peritoneal air. Pelvic images demonstrate the bladder and rectum to be within normal. Surgical absence of the uterus. There is fusion hardware over the lumbar spine. Degenerative change of the spine hips. IMPRESSION:  Multiple dilated small bowel loops measuring up to 3.9 cm in diameter with air in stool throughout the colon as findings likely represent early/partial small bowel obstruction. No definite transition point as etiology may be adhesions. Colonic diverticulosis without active inflammation. Multiple pulmonary nodules the lung bases with slight interval progression. Largest nodule measures 6 mm over the right middle lobe. Recommend chest CT for complete evaluation of the lungs. Several small liver hypodensities likely cysts or hemangiomas. Chronic atrophic polycystic right kidney. Electronically Signed   By: Marin Olp M.D.   On: 09/10/2015 22:03   Dg Abd Portable 1v-small Bowel Obstruction Protocol-initial, 8 Hr Delay  09/11/2015  CLINICAL DATA:  Small bowel obstruction protocol. Gastrografin given at 1:21 a.m. EXAM: PORTABLE ABDOMEN - 1 VIEW COMPARISON:  Radiographs and CT 09/10/2015. FINDINGS: 1336 hours. Nasogastric tube tip is in the distal stomach or proximal duodenum. Most of the enteric contrast has passed into the colon which is normal in caliber. Mild residual small bowel distention is present. There is contrast material within the bladder attributed to yesterday's CT. No extraluminal  contrast or free air identified. Cholecystectomy clips and prior lumbar fusion noted. IMPRESSION: The contrast has extended into the colon, excluding significant small bowel obstruction. Electronically Signed   By: Richardean Sale M.D.   On: 09/11/2015 13:55    Microbiology: No results found for this or any previous visit (from the past 240 hour(s)).   Labs: Basic Metabolic Panel: No results for input(s): NA, K, CL, CO2, GLUCOSE, BUN, CREATININE, CALCIUM, MG, PHOS in the last 168 hours. Liver Function Tests: No results for input(s): AST, ALT, ALKPHOS, BILITOT, PROT, ALBUMIN in the last 168 hours. No results for input(s): LIPASE, AMYLASE in the last 168 hours. No results for input(s): AMMONIA in the last 168 hours. CBC: No results for input(s): WBC, NEUTROABS, HGB, HCT, MCV, PLT in the last 168 hours. Cardiac Enzymes: No results for input(s): CKTOTAL, CKMB, CKMBINDEX, TROPONINI in the last 168 hours. BNP: BNP (last 3 results) No results for input(s): BNP in the last 8760 hours.  ProBNP (last 3 results) No results for input(s): PROBNP in the last 8760 hours.  CBG: No results for input(s): GLUCAP in the last 168 hours.     Signed:  Kelvin Cellar MD.  [facility] 09/24/2015, 4:37 PM

## 2015-09-26 ENCOUNTER — Inpatient Hospital Stay: Payer: Medicare Other | Admitting: Internal Medicine

## 2015-10-03 ENCOUNTER — Ambulatory Visit (INDEPENDENT_AMBULATORY_CARE_PROVIDER_SITE_OTHER): Payer: Medicare Other | Admitting: Internal Medicine

## 2015-10-03 ENCOUNTER — Encounter: Payer: Self-pay | Admitting: Internal Medicine

## 2015-10-03 VITALS — BP 126/74 | HR 68 | Temp 97.8°F | Ht 64.0 in | Wt 153.2 lb

## 2015-10-03 DIAGNOSIS — I10 Essential (primary) hypertension: Secondary | ICD-10-CM

## 2015-10-03 DIAGNOSIS — K5669 Other intestinal obstruction: Secondary | ICD-10-CM | POA: Diagnosis not present

## 2015-10-03 DIAGNOSIS — R918 Other nonspecific abnormal finding of lung field: Secondary | ICD-10-CM | POA: Diagnosis not present

## 2015-10-03 DIAGNOSIS — M810 Age-related osteoporosis without current pathological fracture: Secondary | ICD-10-CM | POA: Diagnosis not present

## 2015-10-03 DIAGNOSIS — K56609 Unspecified intestinal obstruction, unspecified as to partial versus complete obstruction: Secondary | ICD-10-CM

## 2015-10-03 LAB — CBC WITH DIFFERENTIAL/PLATELET
Basophils Absolute: 0 K/uL (ref 0.0–0.1)
Basophils Relative: 0.5 % (ref 0.0–3.0)
Eosinophils Absolute: 0.2 K/uL (ref 0.0–0.7)
Eosinophils Relative: 2.9 % (ref 0.0–5.0)
HCT: 36.4 % (ref 36.0–46.0)
Hemoglobin: 12 g/dL (ref 12.0–15.0)
Lymphocytes Relative: 18.3 % (ref 12.0–46.0)
Lymphs Abs: 1.5 K/uL (ref 0.7–4.0)
MCHC: 33 g/dL (ref 30.0–36.0)
MCV: 94.3 fl (ref 78.0–100.0)
Monocytes Absolute: 0.6 K/uL (ref 0.1–1.0)
Monocytes Relative: 7.8 % (ref 3.0–12.0)
Neutro Abs: 5.8 K/uL (ref 1.4–7.7)
Neutrophils Relative %: 70.5 % (ref 43.0–77.0)
Platelets: 279 K/uL (ref 150.0–400.0)
RBC: 3.86 Mil/uL — ABNORMAL LOW (ref 3.87–5.11)
RDW: 14.7 % (ref 11.5–15.5)
WBC: 8.2 K/uL (ref 4.0–10.5)

## 2015-10-03 NOTE — Progress Notes (Signed)
Subjective:    Patient ID: [name], female    DOB: [birth year], 82 y.o.   MRN: [id number]  DOS:  10/03/2015 Type of visit - description : Hospital follow-up Interval history: Admitted to hospital 09/10/2015 for 3 days. SBO--admitted with symptoms of SBO, CT was confirmatory, s/p NG tube, IV fluids, surgery was consulted, discharge home in  improved condition Incidentally they noted pulmonary nodules. Most recent labs before discharge: Potassium 3.6, creatinine 0.6. Hemoglobin 10.6, slightly low, likely due to IV fluids  Review of Systems Since she left the hospital she is doing better. No fever chills Appetite is "always good" No difficulty breathing, edema. No blood in the stools Has bowel movements every other day on average, stools look normal.   Past Medical History  Diagnosis Date  . Hyperlipidemia   . Hypertension   . Anxiety and depression   . Pulmonary nodules     [place]. First seen by CT only 05/04/03, no change on f/u 08/08/06 -Macroscopic changes rml 04/27/07 > resolved april 6,2010 no further w/u   . Knee pain     right chronic  . Back pain     lumbar  . SVT (supraventricular tachycardia) (HCC)     sees Dr.Ross  . Hydronephrosis     Right 2008 s/p urology eval CT/cysto: observation/monitor  . Headache(784.0)   . GI (gastrointestinal bleed)     hx of colon polyps 2004, diverticulosis, internal hemmorhids  . Migraine     on topamax , per neuro  . Seborrheic keratosis     Left inferior posterior flank  . Fecal incontinence     Dr. Michail Sermon, likely due to loose stools in conjunction with pelvic muscle laxity from aging, recommended to stop Miralax    Past Surgical History  Procedure Laterality Date  . Abdominal hysterectomy    . Rotator cuff repair  9/-2011  . Total knee arthroplasty      right  . Cataract extraction  2008    Dr.Digby  . R ovary removed    . Lumbar reconstructive surgery  01/07/2009  . Appendectomy    . Cholecystectomy    .  Tonsillectomy    . Bladder repair    . Shoulder surgery  12-2011    LEFT  . Trigger finger surgery      L thumb (2014), R ring finger 05-2013  . Basal cell carcinoma excision      Social History   Social History  . Marital Status: Widowed    Spouse Name: N/A  . Number of Children: 1  . Years of Education: N/A   Occupational History  . retired     Social History Main Topics  . Smoking status: Never Smoker   . Smokeless tobacco: Never Used  . Alcohol Use: Yes     Comment: rarely  . Drug Use: No  . Sexual Activity: Not on file   Other Topics Concern  . Not on file   Social History Narrative   Moved from a retirement home to a condominium 04-2013, lives by herself , drives    Son lives in [place]        Medication List       This list is accurate as of: 10/03/15 11:59 PM.  Always use your most recent med list.               acetaminophen 500 MG tablet  Commonly known as:  TYLENOL  Take 500 mg by mouth every 6 (six)  hours as needed for moderate pain.     alendronate 70 MG tablet  Commonly known as:  FOSAMAX  Take 1 tablet (70 mg total) by mouth every 7 (seven) days. Take with a full glass of water on an empty stomach.     aspirin 81 MG tablet  Take 81 mg by mouth at bedtime.     atorvastatin 40 MG tablet  Commonly known as:  LIPITOR  Take 1 tablet (40 mg total) by mouth at bedtime.     azelastine 0.1 % nasal spray  Commonly known as:  ASTELIN  Place 2 sprays into both nostrils 2 (two) times daily.     calcium gluconate 500 MG tablet  Take 500 mg by mouth 2 (two) times daily. Reported on 07/04/2015     celecoxib 200 MG capsule  Commonly known as:  CELEBREX  Take 200 mg by mouth daily.     desoximetasone 0.25 % cream  Commonly known as:  TOPICORT  1 APPLICATION APPLY ON THE SKIN TWICE A DAY APPLY TO RASH ON HANDS TWICE DAILY AS NEEDED FOR RASH     diltiazem 120 MG 24 hr capsule  Commonly known as:  CARDIZEM CD  Take 1 capsule (120 mg total) by mouth  daily.     gabapentin 100 MG capsule  Commonly known as:  NEURONTIN  Take 200-300 mg by mouth 3 (three) times daily as needed (pain).     multivitamin per tablet  Take 1 tablet by mouth daily.     NON FORMULARY  Take 2 capsules by mouth 2 (two) times daily. [place]     PROBIOTIC DAILY PO  Take 1 capsule by mouth daily.     pseudoephedrine-acetaminophen 30-500 MG Tabs tablet  Commonly known as:  TYLENOL SINUS  Take 1 tablet by mouth every 4 (four) hours as needed (sinus congestion).     tamsulosin 0.4 MG Caps capsule  Commonly known as:  FLOMAX  Take 0.4 mg by mouth once a week.     tolterodine 4 MG 24 hr capsule  Commonly known as:  DETROL LA  Take 4 mg by mouth daily.     VITAMIN C PO  Take 1 tablet by mouth 2 (two) times daily.           Objective:   Physical Exam BP 126/74 mmHg  Pulse 68  Temp(Src) 97.8 F (36.6 C) (Oral)  Ht 5\' 4"  (1.626 m)  Wt 153 lb 4 oz (69.514 kg)  BMI 26.29 kg/m2  SpO2 96%   General:   Well developed, well nourished . NAD.  HEENT:  Normocephalic . Face symmetric, atraumatic Lungs:  CTA B Normal respiratory effort, no intercostal retractions, no accessory muscle use. Heart: RRR,  no murmur.  no pretibial edema bilaterally  Abdomen:  Not distended, soft, minimal tenderness to palpation, good bowel sounds. No rebound or rigidity.  Skin: Not pale. Not jaundice Neurologic:  alert & oriented X3.  Speech normal, gait appropriate for age and unassisted Psych--  Cognition and judgment appear intact.  Cooperative with normal attention span and concentration.  Behavior appropriate. No anxious or depressed appearing.   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 SVT, saw Dr. Harrington Challenger 2014, on cardiazem Migraines, on Topamax-gabapentin, sees neurology (Dr Domingo Cocking)   Fecal incontinence, GI Dr. Michail Sermon Pulmonary nodules [place]. First seen by CT only 05/04/03, no change on f/u 08/08/06 -Macroscopic changes rml 04/27/07 > resolved april 6,2010 no further w/u  Nodules noted again: CT 08/2015  [facility]  Sees Dermatology  Admitted-- SBO 08-2015    PLAN  SBO: Resolving, anemia noted, likely dilutional, recheck a CBC. Osteoporosis: Started Fosamax, good compliance-tolerance, patient somewhat concerned about Fosamax and GI side effects however she is not having problems, recommend to continue Fosamax for now HTN: Controlled, last BMP satisfactory. Pulmonary nodules : noted on CT during last admission, rec to recheck a CT in 6 months  RTC 12-2015 as a schedule.

## 2015-10-03 NOTE — Patient Instructions (Addendum)
GO TO THE LAB : Get the blood work     See you in September    

## 2015-10-03 NOTE — Progress Notes (Signed)
Pre visit review using our clinic review tool, if applicable. No additional management support is needed unless otherwise documented below in the visit note. 

## 2015-10-04 NOTE — Assessment & Plan Note (Signed)
  SBO: Resolving, anemia noted, likely dilutional, recheck a CBC. Osteoporosis: Started Fosamax, good compliance-tolerance, patient somewhat concerned about Fosamax and GI side effects however she is not having problems, recommend to continue Fosamax for now HTN: Controlled, last BMP satisfactory. Pulmonary nodules : noted on CT during last admission, rec to recheck a CT in 6 months  RTC 12-2015 as a schedule.

## 2015-10-10 ENCOUNTER — Other Ambulatory Visit: Payer: Self-pay | Admitting: Internal Medicine

## 2015-10-10 ENCOUNTER — Ambulatory Visit
Admission: RE | Admit: 2015-10-10 | Discharge: 2015-10-10 | Disposition: A | Discharge status: Home / Self Care | Payer: Medicare Other | Source: Ambulatory Visit

## 2015-10-10 DIAGNOSIS — Z1231 Encounter for screening mammogram for malignant neoplasm of breast: Secondary | ICD-10-CM

## 2015-10-14 ENCOUNTER — Other Ambulatory Visit: Payer: Self-pay | Admitting: Internal Medicine

## 2015-10-14 DIAGNOSIS — R928 Other abnormal and inconclusive findings on diagnostic imaging of breast: Secondary | ICD-10-CM

## 2015-10-22 ENCOUNTER — Ambulatory Visit
Admission: RE | Admit: 2015-10-22 | Discharge: 2015-10-22 | Disposition: A | Discharge status: Home / Self Care | Payer: Medicare Other | Source: Ambulatory Visit | Attending: Internal Medicine | Admitting: Internal Medicine

## 2015-10-22 DIAGNOSIS — R928 Other abnormal and inconclusive findings on diagnostic imaging of breast: Secondary | ICD-10-CM

## 2016-01-05 ENCOUNTER — Encounter: Payer: Self-pay | Admitting: Internal Medicine

## 2016-01-05 ENCOUNTER — Ambulatory Visit (INDEPENDENT_AMBULATORY_CARE_PROVIDER_SITE_OTHER): Payer: Medicare Other | Admitting: Internal Medicine

## 2016-01-05 VITALS — BP 118/76 | HR 76 | Temp 97.6°F | Resp 14 | Ht 64.0 in | Wt 156.4 lb

## 2016-01-05 DIAGNOSIS — N182 Chronic kidney disease, stage 2 (mild): Secondary | ICD-10-CM

## 2016-01-05 DIAGNOSIS — I10 Essential (primary) hypertension: Secondary | ICD-10-CM

## 2016-01-05 DIAGNOSIS — E785 Hyperlipidemia, unspecified: Secondary | ICD-10-CM

## 2016-01-05 MED ORDER — AZELASTINE HCL 0.1 % NA SOLN: 2.0000 | Freq: Two times a day (BID) | NASAL | 5 refills | Status: DC

## 2016-01-05 NOTE — Progress Notes (Signed)
Pre visit review using our clinic review tool, if applicable. No additional management support is needed unless otherwise documented below in the visit note. 

## 2016-01-05 NOTE — Patient Instructions (Signed)
  GO TO THE FRONT DESK Schedule your next appointment for a  physical exam, fasting in 3-4 months  For allergies: Astelin 2 sprays on each side of the nose every night  OTC Flonase 2 sprays on each side of the nose every morning   If the cough is not improving let me know

## 2016-01-05 NOTE — Progress Notes (Signed)
Subjective:    Patient ID: [name], female    DOB: [birth year], 82 y.o.   MRN: [id number]  DOS:  01/05/2016 Type of visit - description : rov Interval history: Medication reviewed, good compliance without apparent side effects Labs reviewed: Not due for any testing Complains of postnasal dripping, mucus pools in the throat and make her cough. From time to time she sees yellow sputum. On Celebrex chronically. No apparent side effects.   Review of Systems Denies fever or chills No nausea, vomiting, abdominal pain.   Past Medical History:  Diagnosis Date  . Anxiety and depression   . Back pain    lumbar  . Fecal incontinence    Dr. Michail Sermon, likely due to loose stools in conjunction with pelvic muscle laxity from aging, recommended to stop Miralax  . GI (gastrointestinal bleed)    hx of colon polyps 2004, diverticulosis, internal hemmorhids  . Headache(784.0)   . Hydronephrosis    Right 2008 s/p urology eval CT/cysto: observation/monitor  . Hyperlipidemia   . Hypertension   . Knee pain    right chronic  . Migraine    on topamax , per neuro  . Pulmonary nodules    Wert. First seen by CT only 05/04/03, no change on f/u 08/08/06 -Macroscopic changes rml 04/27/07 > resolved april 6,2010 no further w/u   . Seborrheic keratosis    Left inferior posterior flank  . SVT (supraventricular tachycardia) (HCC)    sees Dr.Ross    Past Surgical History:  Procedure Laterality Date  . ABDOMINAL HYSTERECTOMY    . APPENDECTOMY    . BASAL CELL CARCINOMA EXCISION    . BLADDER REPAIR    . CATARACT EXTRACTION  2008   Dr.Digby  . CHOLECYSTECTOMY    . lumbar reconstructive surgery  01/07/2009  . R ovary removed    . ROTATOR CUFF REPAIR  9/-2011  . SHOULDER SURGERY  12-2011   LEFT  . TONSILLECTOMY    . TOTAL KNEE ARTHROPLASTY     right  . trigger finger surgery     L thumb (2014), R ring finger 05-2013    Social History   Social History  . Marital status: Widowed    Spouse  name: N/A  . Number of children: 1  . Years of education: N/A   Occupational History  . retired     Social History Main Topics  . Smoking status: Never Smoker  . Smokeless tobacco: Never Used  . Alcohol use Yes     Comment: rarely  . Drug use: No  . Sexual activity: Not on file   Other Topics Concern  . Not on file   Social History Narrative   Moved from a retirement home to a condominium 04-2013, lives by herself , drives    Son lives in [place]        Medication List       Accurate as of 01/05/16 11:02 AM. Always use your most recent med list.          acetaminophen 500 MG tablet Commonly known as:  TYLENOL Take 500 mg by mouth every 6 (six) hours as needed for moderate pain.   alendronate 70 MG tablet Commonly known as:  FOSAMAX Take 1 tablet (70 mg total) by mouth every 7 (seven) days. Take with a full glass of water on an empty stomach.   aspirin 81 MG tablet Take 81 mg by mouth at bedtime.   atorvastatin 40 MG tablet Commonly known  as:  LIPITOR Take 1 tablet (40 mg total) by mouth at bedtime.   azelastine 0.1 % nasal spray Commonly known as:  ASTELIN Place 2 sprays into both nostrils 2 (two) times daily.   calcium gluconate 500 MG tablet Take 500 mg by mouth 2 (two) times daily. Reported on 07/04/2015   celecoxib 200 MG capsule Commonly known as:  CELEBREX Take 200 mg by mouth daily.   CRANBERRY PO Take 1 capsule by mouth daily.   desoximetasone 0.25 % cream Commonly known as:  TOPICORT 1 APPLICATION APPLY ON THE SKIN TWICE A DAY APPLY TO RASH ON HANDS TWICE DAILY AS NEEDED FOR RASH   diltiazem 120 MG 24 hr capsule Commonly known as:  CARDIZEM CD Take 1 capsule (120 mg total) by mouth daily.   gabapentin 100 MG capsule Commonly known as:  NEURONTIN Take 200-300 mg by mouth 3 (three) times daily as needed (pain).   multivitamin per tablet Take 1 tablet by mouth daily.   NON FORMULARY Take 2 capsules by mouth 2 (two) times daily. Hydro  Eye   PROBIOTIC DAILY PO Take 1 capsule by mouth daily.   pseudoephedrine-acetaminophen 30-500 MG Tabs tablet Commonly known as:  TYLENOL SINUS Take 1 tablet by mouth every 4 (four) hours as needed (sinus congestion).   tamsulosin 0.4 MG Caps capsule Commonly known as:  FLOMAX Take 0.4 mg by mouth once a week.   tolterodine 4 MG 24 hr capsule Commonly known as:  DETROL LA Take 4 mg by mouth daily.   VITAMIN C PO Take 1 tablet by mouth 2 (two) times daily.          Objective:   Physical Exam BP 118/76 (BP Location: Left Arm, Patient Position: Sitting, Cuff Size: Small)   Pulse 76   Temp 97.6 F (36.4 C) (Oral)   Resp 14   Ht 5\' 4"  (1.626 m)   Wt 156 lb 6 oz (70.9 kg)   SpO2 97%   BMI 26.84 kg/m  General:   Well developed, well nourished . NAD.  HEENT:  Normocephalic . Face symmetric, atraumatic Lungs:  CTA B Normal respiratory effort, no intercostal retractions, no accessory muscle use. Heart: RRR,  no murmur.  No pretibial edema bilaterally  Skin: Not pale. Not jaundice Neurologic:  alert & oriented X3.  Speech normal, gait appropriate for age and unassisted Psych--  Cognition and judgment appear intact.  Cooperative with normal attention span and concentration.  Behavior appropriate. No anxious or depressed appearing.      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 SVT, saw Dr. Harrington Challenger 2014, on cardiazem Migraines, on Topamax-gabapentin, sees neurology (Dr Domingo Cocking)   GI: Fecal  incontinence, GI Dr. Michail Sermon H/o SBO 08-2015 Pulmonary nodules Wert. First seen by CT only 05/04/03, no change on f/u 08/08/06 -Macroscopic changes rml 04/27/07 > resolved april 6,2010 no further w/u  Nodules noted again: CT 08/2015  [facility]  Sees Dermatology     PLAN: HTN: Well-controlled on diltiazem. Hyperlipidemia: Controlled at the Lipitor, no apparent s/e CKD: Last  creatinine is stable, she takes Celebrex chronically (okay by renal) Puffiness, left prior clavicular area: Exam negative. Rx observation Allergies: Recommend consistent use of Astelin and also Flonase. Call if not better RTC 3-4 months, CPX

## 2016-01-06 DIAGNOSIS — N182 Chronic kidney disease, stage 2 (mild): Secondary | ICD-10-CM | POA: Insufficient documentation

## 2016-01-06 NOTE — Assessment & Plan Note (Signed)
HTN: Well-controlled on diltiazem. Hyperlipidemia: Controlled at the Lipitor, no apparent s/e CKD: Last creatinine is stable, she takes Celebrex chronically (okay by renal) Puffiness, left prior clavicular area: Exam negative. Rx observation Allergies: Recommend consistent use of Astelin and also Flonase. Call if not better RTC 3-4 months, CPX

## 2016-01-16 ENCOUNTER — Telehealth: Payer: Self-pay | Admitting: Internal Medicine

## 2016-01-16 DIAGNOSIS — N390 Urinary tract infection, site not specified: Secondary | ICD-10-CM

## 2016-01-16 NOTE — Telephone Encounter (Signed)
Pt called in to request a referral to a urologist. She says that she had a UTI and had a difficult time getting rid of it. She would like confirmation once referral is placed.     CBEC:[id number]   Urology - Dr. Roni Bread [phone] is were she prefer but if not she would go to any location

## 2016-01-16 NOTE — Telephone Encounter (Signed)
Please advise 

## 2016-01-19 NOTE — Telephone Encounter (Signed)
Okay to refer back to urology (Dr Jeffie Pollock) ; however if she has current UTI sx she needs to be seen.

## 2016-01-19 NOTE — Telephone Encounter (Signed)
Spoke w/ Pt, she denies current UTI Sx's however she would still like to see Dr. Jeffie Pollock regarding recurrent UTI's. Referral placed. Pt informed she will receive a call to schedule appt.

## 2016-01-20 ENCOUNTER — Other Ambulatory Visit: Payer: Self-pay | Admitting: Internal Medicine

## 2016-02-16 ENCOUNTER — Telehealth: Payer: Self-pay

## 2016-02-16 ENCOUNTER — Telehealth: Payer: Self-pay | Admitting: Internal Medicine

## 2016-02-16 NOTE — Telephone Encounter (Signed)
Please advise 

## 2016-02-16 NOTE — Telephone Encounter (Signed)
A good option would be an injectable called PROLIA,  if she is willing to take it okay to start. Please provide the pt with the med name so she can do her own research.

## 2016-02-16 NOTE — Telephone Encounter (Signed)
Prolia insurance verification initiated via [employer] online portal. Form sent for scanning. Awaiting verification form.

## 2016-02-16 NOTE — Telephone Encounter (Signed)
Caller name: Relationship to patient: Self Can be reached: [phone] Pharmacy:  Reason for call: Patient states that the Fosamax is causing her dental issues and she is not going to take it anymore. Wants to know if there is something else she can take.

## 2016-02-16 NOTE — Telephone Encounter (Signed)
Spoke w/ Pt, she agrees to try Prolia depending on cost. Informed her I would request benefit determination from her insurance company and call back in several days once received. Pt verbalized understanding. Fosamax added to intolerant/allergy list.

## 2016-02-25 NOTE — Telephone Encounter (Addendum)
Spoke w/ Pt, informed her of Summary of Benefits and her estimated OOP expense for Prolia. She is worried about being able to afford medication. Informed her she can think about receiving medication and call if she has decided. Pt agreed to that plan for now. Summary of Benefits sent for scanning.

## 2016-02-25 NOTE — Telephone Encounter (Signed)
Received Summary of Benefits for Prolia. Pt is responsible for $50 dollar co-pay for Prolia and $40 co-pay for administration and OV (whether OV is billed or not). Co-pays contribute to a $4000 out of pocket max ($1251 met). Once out of pocket max is met, copays will be waived. No deductible, or coinsurance applies. No PA needed.

## 2016-02-28 ENCOUNTER — Other Ambulatory Visit: Payer: Self-pay | Admitting: Internal Medicine

## 2016-03-08 ENCOUNTER — Telehealth: Payer: Self-pay | Admitting: Internal Medicine

## 2016-03-08 NOTE — Telephone Encounter (Signed)
PT dropped off document to be filled out (Disability Parking Placard) pt states needs it at least this Monday 03-15-16. Please advise ASAP, pt will pick up but will be out of town since this Wednesday and will return back on Sunday but need this document by Monday. Document put front office tray.

## 2016-03-08 NOTE — Telephone Encounter (Signed)
LMOM informing Pt that form has been completed at placed at front desk for pick up at her convenience. Copy of form sent for scanning.

## 2016-04-04 ENCOUNTER — Telehealth: Payer: Self-pay | Admitting: Internal Medicine

## 2016-04-04 DIAGNOSIS — R918 Other nonspecific abnormal finding of lung field: Secondary | ICD-10-CM

## 2016-04-04 NOTE — Telephone Encounter (Signed)
Please enter a order for CT chest without contrast. DX follow-up pulmonary nodules. Let the patient know.

## 2016-04-05 NOTE — Telephone Encounter (Signed)
CT ordered. 

## 2016-04-15 NOTE — Telephone Encounter (Signed)
Patient is calling to find out where and when she is supposed to get this CT done. She had thought someone mentioned [facility] but is unsure. Please advise.    Patient phone: [phone] (can leave a message)

## 2016-04-15 NOTE — Telephone Encounter (Signed)
Spoke with patient, number given to central scheduling

## 2016-04-15 NOTE — Telephone Encounter (Signed)
Order sent to imaging 04/05/2016. Jenn-can you check status? Thank you.

## 2016-04-23 ENCOUNTER — Ambulatory Visit (HOSPITAL_COMMUNITY)
Admission: RE | Admit: 2016-04-23 | Discharge: 2016-04-23 | Disposition: A | Discharge status: Home / Self Care | Payer: Medicare Other | Source: Ambulatory Visit | Attending: Internal Medicine | Admitting: Internal Medicine

## 2016-04-23 ENCOUNTER — Encounter (HOSPITAL_COMMUNITY): Payer: Self-pay

## 2016-04-23 DIAGNOSIS — R918 Other nonspecific abnormal finding of lung field: Secondary | ICD-10-CM | POA: Diagnosis not present

## 2016-05-10 ENCOUNTER — Encounter: Payer: Self-pay | Admitting: Internal Medicine

## 2016-05-10 ENCOUNTER — Ambulatory Visit (INDEPENDENT_AMBULATORY_CARE_PROVIDER_SITE_OTHER): Payer: Medicare Other | Admitting: Internal Medicine

## 2016-05-10 VITALS — BP 118/78 | HR 76 | Temp 97.6°F | Resp 14 | Ht 64.0 in | Wt 156.0 lb

## 2016-05-10 DIAGNOSIS — N189 Chronic kidney disease, unspecified: Secondary | ICD-10-CM

## 2016-05-10 DIAGNOSIS — I10 Essential (primary) hypertension: Secondary | ICD-10-CM | POA: Diagnosis not present

## 2016-05-10 DIAGNOSIS — M810 Age-related osteoporosis without current pathological fracture: Secondary | ICD-10-CM

## 2016-05-10 DIAGNOSIS — N182 Chronic kidney disease, stage 2 (mild): Secondary | ICD-10-CM

## 2016-05-10 DIAGNOSIS — Z0000 Encounter for general adult medical examination without abnormal findings: Secondary | ICD-10-CM | POA: Diagnosis not present

## 2016-05-10 DIAGNOSIS — E785 Hyperlipidemia, unspecified: Secondary | ICD-10-CM

## 2016-05-10 LAB — BASIC METABOLIC PANEL
BUN: 16 mg/dL (ref 6–23)
CALCIUM: 9.7 mg/dL (ref 8.4–10.5)
CO2: 25 mEq/L (ref 19–32)
CREATININE: 0.91 mg/dL (ref 0.40–1.20)
Chloride: 106 mEq/L (ref 96–112)
GFR: 62.56 mL/min (ref >60.00)
Glucose, Bld: 98 mg/dL (ref 70–99)
POTASSIUM: 4.3 meq/L (ref 3.5–5.1)
Sodium: 138 mEq/L (ref 135–145)

## 2016-05-10 LAB — CBC WITH DIFFERENTIAL/PLATELET
BASOS ABS: 0 10*3/uL (ref 0.0–0.1)
BASOS PCT: 0.3 % (ref 0.0–3.0)
EOS ABS: 0.2 10*3/uL (ref 0.0–0.7)
Eosinophils Relative: 3.2 % (ref 0.0–5.0)
HCT: 37 % (ref 36.0–46.0)
Hemoglobin: 12.2 g/dL (ref 12.0–15.0)
LYMPHS ABS: 1 10*3/uL (ref 0.7–4.0)
Lymphocytes Relative: 15 % (ref 12.0–46.0)
MCHC: 33 g/dL (ref 30.0–36.0)
MCV: 93.6 fl (ref 78.0–100.0)
MONO ABS: 0.7 10*3/uL (ref 0.1–1.0)
Monocytes Relative: 10.9 % (ref 3.0–12.0)
NEUTROS ABS: 4.8 10*3/uL (ref 1.4–7.7)
NEUTROS PCT: 70.6 % (ref 43.0–77.0)
PLATELETS: 273 10*3/uL (ref 150.0–400.0)
RBC: 3.96 Mil/uL (ref 3.87–5.11)
RDW: 14.6 % (ref 11.5–15.5)
WBC: 6.8 10*3/uL (ref 4.0–10.5)

## 2016-05-10 LAB — MICROALBUMIN / CREATININE URINE RATIO
CREATININE, U: 68.7 mg/dL
MICROALB/CREAT RATIO: 1 mg/g (ref 0.0–30.0)
Microalb, Ur: <0.7 mg/dL (ref 0.0–1.9)

## 2016-05-10 LAB — ALT: ALT: 10 U/L (ref 0–35)

## 2016-05-10 LAB — AST: AST: 17 U/L (ref 0–37)

## 2016-05-10 LAB — VITAMIN B12: Vitamin B-12: 381 pg/mL (ref 211–911)

## 2016-05-10 NOTE — Telephone Encounter (Signed)
Pt has agreed to start [place]. Tricia-can you order a PROLIA for Pt please? Thank you.

## 2016-05-10 NOTE — Progress Notes (Signed)
Subjective:    Patient ID: [name], female    DOB: [birth year], 83 y.o.   MRN: [id number]  DOS:  05/10/2016 Type of visit - description : CPX Interval history: In addition to her CPX, we discussed  multiple other concerns   Review of Systems Constitutional: No fever. No chills. No unexplained wt changes. No unusual sweats  HEENT:  Reports her teeth are loose, she thinks related to Fosamax . no ear discharge, no facial swelling, no voice changes. No eye discharge, no eye  redness , no  intolerance to light   Respiratory: No wheezing , no  difficulty breathing. Occasionally has a dry cough , no mucus production  Cardiovascular: No CP, no leg swelling , no  Palpitations  GI: no nausea, no vomiting, no diarrhea , no  abdominal pain.  No blood in the stools. No dysphagia, no odynophagia    Endocrine: No polyphagia, no polyuria , no polydipsia  GU: Ongoing urinary symptoms, saw urology 3 weeks ago, was prescribed a sulfa ABX for 6 weeks, currently on her third week  Musculoskeletal: No joint swellings or unusual aches or pains  Skin: No change in the color of the skin, palor , no  Rash  Allergic, immunologic: No environmental allergies , no  food allergies  Neurological:  Occasionally feels dizzy when she turns certain ways, last a couple of seconds, no associated stroke symptoms. Things symptoms worse for the last 3 weeks after she started taking the antibiotic. Occasional headaches, at baseline  no  syncope. No diplopia, no slurred, no slurred speech, no motor deficits, no facial  Numbness  Hematological: No enlarged lymph nodes, no easy bruising , no unusual bleedings  Psychiatry: No suicidal ideas, no hallucinations, no beavior problems, no confusion.  No unusual/severe anxiety, no depression   Past Medical History:  Diagnosis Date  . Anxiety and depression   . Back pain    lumbar  . Fecal incontinence    Dr. Michail Sermon, likely due to loose stools in  conjunction with pelvic muscle laxity from aging, recommended to stop Miralax  . GI (gastrointestinal bleed)    hx of colon polyps 2004, diverticulosis, internal hemmorhids  . Headache(784.0)   . Hydronephrosis    Right 2008 s/p urology eval CT/cysto: observation/monitor  . Hyperlipidemia   . Hypertension   . Knee pain    right chronic  . Migraine    on topamax , per neuro  . Pulmonary nodules    Wert. First seen by CT only 05/04/03, no change on f/u 08/08/06 -Macroscopic changes rml 04/27/07 > resolved april 6,2010 no further w/u   . Seborrheic keratosis    Left inferior posterior flank  . SVT (supraventricular tachycardia) (HCC)    sees Dr.Ross    Past Surgical History:  Procedure Laterality Date  . ABDOMINAL HYSTERECTOMY    . APPENDECTOMY    . BASAL CELL CARCINOMA EXCISION    . BLADDER REPAIR    . CATARACT EXTRACTION  2008   Dr.Digby  . CHOLECYSTECTOMY    . lumbar reconstructive surgery  01/07/2009  . R ovary removed    . ROTATOR CUFF REPAIR  9/-2011  . SHOULDER SURGERY  12-2011   LEFT  . TONSILLECTOMY    . TOTAL KNEE ARTHROPLASTY     right  . trigger finger surgery     L thumb (2014), R ring finger 05-2013    Social History   Social History  . Marital status: Widowed    Spouse  name: N/A  . Number of children: 1  . Years of education: N/A   Occupational History  . retired     Social History Main Topics  . Smoking status: Never Smoker  . Smokeless tobacco: Never Used  . Alcohol use Yes     Comment: rarely  . Drug use: No  . Sexual activity: Not on file   Other Topics Concern  . Not on file   Social History Narrative   Moved from a retirement home to a condominium 04-2013, independent living,  lives by herself , drives    Son lives in [place]     Family History  Problem Relation Age of Onset  . Breast cancer Mother     later in life  . Diabetes Mother     late in life  . Heart disease Mother     late onset  . Lung cancer Father     apparently had  TB  . Heart disease Father     late onset  . Prostate cancer Father   . Colitis Brother   . Cancer Brother   . Colon cancer Neg Hx      Allergies as of 05/10/2016      Reactions   Amitriptyline Hcl    REACTION: hallucinations   Carisoprodol    REACTION: nightmares,rash,tachycardia   Codeine    REACTION: "deathly ill" nauesa, dizzy,weak   Fosamax [alendronate Sodium] Other (See Comments)   Dental issues   Meloxicam    REACTION: sensitive to sun; facial redness; may cause severe abd pain   Metronidazole    REACTION: abd pain   Nortriptyline Hcl    REACTION: hallucinations   Oxaprozin    REACTION: abd pain   Promethazine Hcl    REACTION: spastic arms and legs flailing while awake and unusual behavior while sleep - walking all over house during night   Rofecoxib    REACTION: rash   Sumatriptan    REACTION: rash   Tramadol Other (See Comments)   Dizziness and constipation      Medication List       Accurate as of 05/10/16  5:58 PM. Always use your most recent med list.          acetaminophen 500 MG tablet Commonly known as:  TYLENOL Take 500 mg by mouth every 6 (six) hours as needed for moderate pain.   aspirin 81 MG tablet Take 81 mg by mouth at bedtime.   atorvastatin 40 MG tablet Commonly known as:  LIPITOR Take 1 tablet (40 mg total) by mouth at bedtime.   azelastine 0.1 % nasal spray Commonly known as:  ASTELIN Place 2 sprays into both nostrils 2 (two) times daily.   calcium gluconate 500 MG tablet Take 500 mg by mouth 2 (two) times daily. Reported on 07/04/2015   celecoxib 200 MG capsule Commonly known as:  CELEBREX Take 200 mg by mouth daily.   CRANBERRY PO Take 1 capsule by mouth daily.   desoximetasone 0.25 % cream Commonly known as:  TOPICORT 1 APPLICATION APPLY ON THE SKIN TWICE A DAY APPLY TO RASH ON HANDS TWICE DAILY AS NEEDED FOR RASH   diltiazem 120 MG 24 hr capsule Commonly known as:  CARDIZEM CD Take 1 capsule (120 mg total) by  mouth daily.   gabapentin 100 MG capsule Commonly known as:  NEURONTIN Take 200-300 mg by mouth 3 (three) times daily as needed (pain).   multivitamin per tablet Take 1 tablet by mouth daily.  NON FORMULARY Take 2 capsules by mouth 2 (two) times daily. Hydro Eye   PROBIOTIC DAILY PO Take 1 capsule by mouth daily.   pseudoephedrine-acetaminophen 30-500 MG Tabs tablet Commonly known as:  TYLENOL SINUS Take 1 tablet by mouth every 4 (four) hours as needed (sinus congestion).   tolterodine 4 MG 24 hr capsule Commonly known as:  DETROL LA Take 4 mg by mouth daily.   trimethoprim 100 MG tablet Commonly known as:  TRIMPEX Take 100 mg by mouth daily.   VITAMIN C PO Take 1 tablet by mouth 2 (two) times daily.          Objective:   Physical Exam BP 118/78 (BP Location: Left Arm, Patient Position: Sitting, Cuff Size: Normal)   Pulse 76   Temp 97.6 F (36.4 C) (Oral)   Resp 14   Ht 5\' 4"  (1.626 m)   Wt 156 lb (70.8 kg)   SpO2 98%   BMI 26.78 kg/m   General:   Well developed, well nourished . NAD.  Neck: No  thyromegaly . Normal carotid pulses HEENT:  Normocephalic . Face symmetric, atraumatic Lungs:  CTA B Normal respiratory effort, no intercostal retractions, no accessory muscle use. Heart: RRR,  no murmur.  No pretibial edema bilaterally  Abdomen:  Not distended, soft, non-tender. No rebound or rigidity.   Skin: Exposed areas without rash. Not pale. Not jaundice Neurologic:  alert & oriented X3.  Speech normal, gait appropriate for age and unassisted Strength symmetric and appropriate for age.  Psych: Cognition and judgment appear intact.  Cooperative with normal attention span and concentration.  Behavior appropriate. No anxious or depressed appearing.    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 SVT, saw Dr. Harrington Challenger 2014, on cardiazem Migraines, on Topamax-gabapentin, flexeril prn sees neurology (Dr Domingo Cocking)   GI: Fecal incontinence, GI Dr. Michail Sermon H/o SBO 08-2015 Pulmonary nodules Wert. First seen by CT only 05/04/03, no change on f/u 08/08/06 -Macroscopic changes rml 04/27/07 > resolved april 6,2010 no further w/u  Nodules noted again: CT 08/2015  CT 04-2016: Stable, recheck in one year is high risk only. [facility]  Sees Dermatology     PLAN: HTN: Continue Cardizem. Checking labs High cholesterol: Continue Lipitor, checking labs DJD: On chronic Celebrex, good sx control, get a microalbumin ( to rule out proteinuria related to COX-I per renal recommendations) CKD: Checking labs, on long-term Celebrex (previously okay by nephrology) Concern about her B12 and vitamin D: Checking level Osteoporosis, T score -3.0 06-2015, took Fosamax for a few months, teeth got loose according to the patient --> self d/c fosamax. Rec PROLIA,, patient quite hesitant, like to discuss with renal. Recommend to get an appointment w/ them ; per literature, no contraindication due to CKD we do need to monitor her calcium levels. Continue calcium, vitamin D and physical activity. Addendum: At the end, she decided to go ahead with prlia, will do. Will check a BMP 2 weeks after the shot to be sure Ca remains  okay. RTC 6 months   Today, I spent more than 22   min with the patient: >50% of the time counseling regards pros and cons of prolia, assessing chronic medical problems.

## 2016-05-10 NOTE — Assessment & Plan Note (Signed)
HTN: Continue Cardizem. Checking labs High cholesterol: Continue Lipitor, checking labs DJD: On chronic Celebrex, good sx control, get a microalbumin ( to rule out proteinuria related to COX-I per renal recommendations) CKD: Checking labs, on long-term Celebrex (previously okay by nephrology) Concern about [name] B12 and vitamin D: Checking level Osteoporosis, T score -3.0 06-2015, took Fosamax for a few months, teeth got loose according to the patient --> self d/c fosamax. Rec PROLIA,, patient quite hesitant, like to discuss with renal. Recommend to get an appointment w/ them ; per literature, no contraindication due to CKD we do need to monitor [name] calcium levels. Continue calcium, vitamin D and physical activity. Addendum: At the end, she decided to go ahead with prlia, will do. Will check a BMP 2 weeks after the shot to be sure Ca remains okay. RTC 6 months

## 2016-05-10 NOTE — Patient Instructions (Signed)
GO TO THE LAB : Get the blood work     GO TO THE FRONT DESK Schedule your next appointment for a  checkup in 6 months  Also, schedule Medicare wellness at your earliest convenience with one of our registered nurses   Take OTC calcium 1 g   and vitamin D 1000 units daily

## 2016-05-10 NOTE — Progress Notes (Signed)
Pre visit review using our clinic review tool, if applicable. No additional management support is needed unless otherwise documented below in the visit note. 

## 2016-05-10 NOTE — Assessment & Plan Note (Addendum)
Td 2014; shingles shot 2007 ; pneumonia shot 2004 and 2009 ; prevnar-- 2016; states she had a Zostavax (no documentation) Had a flu shot  Colonoscopy w/ Dr Deatra Ina, + polyp 11-04  colonoscopy 04-2008 @ [facility] : Hemorrhoids, diverticuli. No polyps   Pap-- sees  Dr. Dian Queen  q year  Per pt MMG-- 10-2015   counseled : diet , exercise

## 2016-05-13 ENCOUNTER — Telehealth: Payer: Self-pay | Admitting: Internal Medicine

## 2016-05-13 LAB — VITAMIN D 1,25 DIHYDROXY
Vitamin D 1, 25 (OH)2 Total: 26 pg/mL (ref 18–72)
Vitamin D2 1, 25 (OH)2: <8 pg/mL
Vitamin D3 1, 25 (OH)2: 26 pg/mL

## 2016-05-13 NOTE — Telephone Encounter (Signed)
Please advise 

## 2016-05-13 NOTE — Telephone Encounter (Signed)
Patient called stated she was just in Monday for her CPE and since then has developed some congestion. She is asking to have something sent in for Bronchitis said she has tried over the counter Tylenol sinus and Muciex and nothing is helping. Pharmacy is [facility] on [address] [phone] please advise

## 2016-05-13 NOTE — Telephone Encounter (Signed)
If not running fevers or feeling very sick: Recommend to continue with rest, fluids, Tylenol, change to Mucinex DM. Also I notice she has Astelin, recommend to use it consistently twice a day. Office visit if not better, definitely if severe symptoms or fever.

## 2016-05-13 NOTE — Telephone Encounter (Signed)
Patient calling back to inform PCP she's currently taking trimethoprim (TRIMPEX) 100 MG tablet, not sure if Rx will effect medication PCP suggest for the symptoms listed below, please advise patient directly.

## 2016-05-13 NOTE — Telephone Encounter (Signed)
I don't think that is going to affect her.

## 2016-05-13 NOTE — Telephone Encounter (Signed)
Spoke w/ Pt, informed of recommendations. Pt verbalized understanding.  

## 2016-05-14 NOTE — Telephone Encounter (Signed)
Per Glenard Haring, okay to perform Prolia at time of AWV.

## 2016-05-14 NOTE — Telephone Encounter (Signed)
Pt's prolia has been received in the office.

## 2016-05-14 NOTE — Telephone Encounter (Signed)
AWV w/ Glenard Haring scheduled 05/21/2016. Can Prolia be given at this appt? Thanks.

## 2016-05-17 ENCOUNTER — Telehealth: Payer: Self-pay | Admitting: Internal Medicine

## 2016-05-17 ENCOUNTER — Ambulatory Visit (INDEPENDENT_AMBULATORY_CARE_PROVIDER_SITE_OTHER): Payer: Medicare Other | Admitting: Family Medicine

## 2016-05-17 ENCOUNTER — Encounter: Payer: Self-pay | Admitting: Family Medicine

## 2016-05-17 VITALS — BP 126/70 | HR 83 | Temp 98.6°F | Resp 18 | Wt 159.6 lb

## 2016-05-17 DIAGNOSIS — B9789 Other viral agents as the cause of diseases classified elsewhere: Secondary | ICD-10-CM | POA: Diagnosis not present

## 2016-05-17 DIAGNOSIS — J069 Acute upper respiratory infection, unspecified: Secondary | ICD-10-CM

## 2016-05-17 MED ORDER — BENZONATATE 100 MG PO CAPS: 100.0000 mg | ORAL_CAPSULE | Freq: Two times a day (BID) | ORAL | 0 refills | Status: DC | PRN

## 2016-05-17 NOTE — Telephone Encounter (Signed)
thx

## 2016-05-17 NOTE — Telephone Encounter (Signed)
Pt called back. She reports strong non-productive cough since last Monday evening. She has been taking Mucinex and Tylenol Sinus at home w/o relief. Denies fever, chills, SOB, dizziness, lightheadedness, N/V/D. Endorses chest congestion and soreness from coughing. Appt scheduled w/ Tor Netters, NP at [facility] office at [employer] today as PCP office is fully booked today and tomorrow.

## 2016-05-17 NOTE — Progress Notes (Addendum)
Subjective:    Patient ID: [name], female    DOB: [birth year], 83 y.o.   MRN: [id number]  HPI This is a 83 yo female, patient of Dr. Larose Kells, who presents today for an acute visit for non productive cough x 1 week. Having clear nasal drainage, intermittent sore throat, no ear pain. Occasionally hears wheezes, no SOB. No muscle aches or fever. She has been taking Mucinex DM, Chloracedin and Tylenol Sinus without relief. No headache. Has slept pretty well, occasional coughing spell. She was seen 05/10/16 for her annual physical exam and denies having symptoms at time of visit. No known sick contacts. Feels a little better today than yesterday.   Past Medical History:  Diagnosis Date  . Anxiety and depression   . Back pain    lumbar  . Fecal incontinence    Dr. Michail Sermon, likely due to loose stools in conjunction with pelvic muscle laxity from aging, recommended to stop Miralax  . GI (gastrointestinal bleed)    hx of colon polyps 2004, diverticulosis, internal hemmorhids  . Headache(784.0)   . Hydronephrosis    Right 2008 s/p urology eval CT/cysto: observation/monitor  . Hyperlipidemia   . Hypertension   . Knee pain    right chronic  . Migraine    on topamax , per neuro  . Pulmonary nodules    Wert. First seen by CT only 05/04/03, no change on f/u 08/08/06 -Macroscopic changes rml 04/27/07 > resolved april 6,2010 no further w/u   . Seborrheic keratosis    Left inferior posterior flank  . SVT (supraventricular tachycardia) (HCC)    sees Dr.Ross   Past Surgical History:  Procedure Laterality Date  . ABDOMINAL HYSTERECTOMY    . APPENDECTOMY    . BASAL CELL CARCINOMA EXCISION    . BLADDER REPAIR    . CATARACT EXTRACTION  2008   Dr.Digby  . CHOLECYSTECTOMY    . lumbar reconstructive surgery  01/07/2009  . R ovary removed    . ROTATOR CUFF REPAIR  9/-2011  . SHOULDER SURGERY  12-2011   LEFT  . TONSILLECTOMY    . TOTAL KNEE ARTHROPLASTY     right  . trigger finger surgery     L thumb (2014), R ring finger 05-2013   Family History  Problem Relation Age of Onset  . Breast cancer Mother     later in life  . Diabetes Mother     late in life  . Heart disease Mother     late onset  . Lung cancer Father     apparently had TB  . Heart disease Father     late onset  . Prostate cancer Father   . Colitis Brother   . Cancer Brother   . Colon cancer Neg Hx    Social History  Substance Use Topics  . Smoking status: Never Smoker  . Smokeless tobacco: Never Used  . Alcohol use Yes     Comment: rarely      Review of Systems  Constitutional: Positive for fatigue. Negative for fever.  HENT: Positive for postnasal drip, rhinorrhea and sore throat (intermittent). Negative for congestion, ear pain and sinus pressure.   Respiratory: Positive for cough (dry) and wheezing (occasional). Negative for shortness of breath.   Neurological: Negative for headaches.       Objective:   Physical Exam  Constitutional: She is oriented to person, place, and time. She appears well-developed and well-nourished. No distress.  HENT:  Head: Normocephalic and atraumatic.  Right Ear: Tympanic membrane and ear canal normal.  Left Ear: Tympanic membrane, external ear and ear canal normal.  Nose: Rhinorrhea present. Right sinus exhibits no maxillary sinus tenderness and no frontal sinus tenderness. Left sinus exhibits no maxillary sinus tenderness and no frontal sinus tenderness.  Mouth/Throat: Uvula is midline and oropharynx is clear and moist.  Eyes: Conjunctivae are normal.  Neck: Normal range of motion. Neck supple.  Cardiovascular: Normal rate, regular rhythm and normal heart sounds.   Pulmonary/Chest: Effort normal. No respiratory distress. She has no wheezes. She has no rales.  Lymphadenopathy:    She has no cervical adenopathy.  Neurological: She is alert and oriented to person, place, and time.  Skin: Skin is warm and dry. She is not diaphoretic.  Psychiatric: She has a  normal mood and affect. Her behavior is normal. Judgment and thought content normal.  Vitals reviewed.  BP 126/70 (BP Location: Left Arm, Patient Position: Sitting, Cuff Size: Normal)   Pulse 83   Temp 98.6 F (37 C) (Oral)   Resp 18   Wt 159 lb 9.6 oz (72.4 kg)   SpO2 95%   BMI 27.40 kg/m  Wt Readings from Last 3 Encounters:  05/17/16 159 lb 9.6 oz (72.4 kg)  05/10/16 156 lb (70.8 kg)  01/05/16 156 lb 6 oz (70.9 kg)       Assessment & Plan:  1. Viral URI with cough - exam reassuring, lungs clear, vitals stable, feels better today than yesterday - likely viral - Provided written and verbal information regarding diagnosis and treatment. - RTC/ER precautions reviewed - benzonatate (TESSALON) 100 MG capsule; Take 1 capsule (100 mg total) by mouth 2 (two) times daily as needed for cough.  Dispense: 20 capsule; Refill: 0   Clarene Reamer, FNP-BC  [facility] [place]  05/17/2016 4:52 PM

## 2016-05-17 NOTE — Progress Notes (Signed)
Pre visit review using our clinic review tool, if applicable. No additional management support is needed unless otherwise documented below in the visit note. 

## 2016-05-17 NOTE — Telephone Encounter (Signed)
Please advise 

## 2016-05-17 NOTE — Patient Instructions (Signed)
Continue Mucinex  Try to decrease Tylenol Sinus usage and use plain tylenol Continue to drink plenty of fluids and rest Please let me or Dr. Larose Kells know if you are feeling worse or have shortness of breath. If the office is closed, please go to the emergency room.    Upper Respiratory Infection, Adult Most upper respiratory infections (URIs) are a viral infection of the air passages leading to the lungs. A URI affects the nose, throat, and upper air passages. The most common type of URI is nasopharyngitis and is typically referred to as "the common cold." URIs run their course and usually go away on their own. Most of the time, a URI does not require medical attention, but sometimes a bacterial infection in the upper airways can follow a viral infection. This is called a secondary infection. Sinus and middle ear infections are common types of secondary upper respiratory infections. Bacterial pneumonia can also complicate a URI. A URI can worsen asthma and chronic obstructive pulmonary disease (COPD). Sometimes, these complications can require emergency medical care and may be life threatening. What are the causes? Almost all URIs are caused by viruses. A virus is a type of germ and can spread from one person to another. What increases the risk? You may be at risk for a URI if:  You smoke.  You have chronic heart or lung disease.  You have a weakened defense (immune) system.  You are very young or very old.  You have nasal allergies or asthma.  You work in crowded or poorly ventilated areas.  You work in health care facilities or schools. What are the signs or symptoms? Symptoms typically develop 2-3 days after you come in contact with a cold virus. Most viral URIs last 7-10 days. However, viral URIs from the influenza virus (flu virus) can last 14-18 days and are typically more severe. Symptoms may include:  Runny or stuffy (congested) nose.  Sneezing.  Cough.  Sore  throat.  Headache.  Fatigue.  Fever.  Loss of appetite.  Pain in your forehead, behind your eyes, and over your cheekbones (sinus pain).  Muscle aches. How is this diagnosed? Your health care provider may diagnose a URI by:  Physical exam.  Tests to check that your symptoms are not due to another condition such as:  Strep throat.  Sinusitis.  Pneumonia.  Asthma. How is this treated? A URI goes away on its own with time. It cannot be cured with medicines, but medicines may be prescribed or recommended to relieve symptoms. Medicines may help:  Reduce your fever.  Reduce your cough.  Relieve nasal congestion. Follow these instructions at home:  Take medicines only as directed by your health care provider.  Gargle warm saltwater or take cough drops to comfort your throat as directed by your health care provider.  Use a warm mist humidifier or inhale steam from a shower to increase air moisture. This may make it easier to breathe.  Drink enough fluid to keep your urine clear or pale yellow.  Eat soups and other clear broths and maintain good nutrition.  Rest as needed.  Return to work when your temperature has returned to normal or as your health care provider advises. You may need to stay home longer to avoid infecting others. You can also use a face mask and careful hand washing to prevent spread of the virus.  Increase the usage of your inhaler if you have asthma.  Do not use any tobacco products, including cigarettes,  chewing tobacco, or electronic cigarettes. If you need help quitting, ask your health care provider. How is this prevented? The best way to protect yourself from getting a cold is to practice good hygiene.  Avoid oral or hand contact with people with cold symptoms.  Wash your hands often if contact occurs. There is no clear evidence that vitamin C, vitamin E, echinacea, or exercise reduces the chance of developing a cold. However, it is always  recommended to get plenty of rest, exercise, and practice good nutrition. Contact a health care provider if:  You are getting worse rather than better.  Your symptoms are not controlled by medicine.  You have chills.  You have worsening shortness of breath.  You have brown or red mucus.  You have yellow or brown nasal discharge.  You have pain in your face, especially when you bend forward.  You have a fever.  You have swollen neck glands.  You have pain while swallowing.  You have white areas in the back of your throat. Get help right away if:  You have severe or persistent:  Headache.  Ear pain.  Sinus pain.  Chest pain.  You have chronic lung disease and any of the following:  Wheezing.  Prolonged cough.  Coughing up blood.  A change in your usual mucus.  You have a stiff neck.  You have changes in your:  Vision.  Hearing.  Thinking.  Mood. This information is not intended to replace advice given to you by your health care provider. Make sure you discuss any questions you have with your health care provider. Document Released: 09/29/2000 Document Revised: 12/07/2015 Document Reviewed: 07/11/2013 Elsevier Interactive Patient Education  2017 [employer].

## 2016-05-17 NOTE — Telephone Encounter (Signed)
Please triage call , fever? , chills, CP?  Needs OV

## 2016-05-17 NOTE — Telephone Encounter (Signed)
Caller name: Relationship to patient: Self Can be reached: [phone] Pharmacy:  [id number] - [place], [place] (Phone) [phone] (Fax)     Reason for call: Patient states she is having chest congestion and wants to have something called in.

## 2016-05-19 ENCOUNTER — Telehealth: Payer: Self-pay | Admitting: Internal Medicine

## 2016-05-19 NOTE — Telephone Encounter (Signed)
Pt called in in regard to her prolia injection. Pt said that she was advised by her kidney Dr that the prolia injection would be to much for her considering that she only has one kidney and is currently taking Celebrax. Showing that pt is scheduled to receive her prolia injection at her wellness visit that's scheduled on 06/03/16.    Pt would like to be advised on alternative if any.    CB: [phone]

## 2016-05-21 ENCOUNTER — Ambulatory Visit: Payer: Medicare Other | Admitting: *Deleted

## 2016-05-24 ENCOUNTER — Other Ambulatory Visit: Payer: Self-pay | Admitting: Family Medicine

## 2016-05-24 DIAGNOSIS — B9789 Other viral agents as the cause of diseases classified elsewhere: Principal | ICD-10-CM

## 2016-05-24 DIAGNOSIS — J069 Acute upper respiratory infection, unspecified: Secondary | ICD-10-CM

## 2016-05-24 NOTE — Telephone Encounter (Signed)
Declined prolia, don't provide it during her wellness  visit

## 2016-05-25 NOTE — Progress Notes (Signed)
Medical screening examination/treatment/procedure(s) were performed by non-physician practitioner and as supervising physician I was immediately available for consultation/collaboration. I agree with above. Anik Wesch, DO   

## 2016-05-27 ENCOUNTER — Telehealth: Payer: Self-pay | Admitting: Family Medicine

## 2016-05-27 ENCOUNTER — Other Ambulatory Visit: Payer: Self-pay | Admitting: Family Medicine

## 2016-05-27 DIAGNOSIS — J069 Acute upper respiratory infection, unspecified: Secondary | ICD-10-CM

## 2016-05-27 DIAGNOSIS — B9789 Other viral agents as the cause of diseases classified elsewhere: Principal | ICD-10-CM

## 2016-05-27 MED ORDER — BENZONATATE 100 MG PO CAPS: 100.0000 mg | ORAL_CAPSULE | Freq: Two times a day (BID) | ORAL | 0 refills | Status: DC | PRN

## 2016-05-27 NOTE — Telephone Encounter (Signed)
Called and spoke to patient who reports that she is feeling better but cough has lingered. Cough continues to be dry. She has gotten some relief with benzonatate and requests refill. Refill sent to her pharmacy. Instructed her to notify me or Dr. Larose Kells if symptoms worsen or don't fully resolve.

## 2016-05-27 NOTE — Telephone Encounter (Signed)
Patient called to speak to CMA about cough medicine [name] prescribed her. The pharmacy will not fill it. (She is a Dr. Larose Kells patient but saw [facility] 2/5 for an acute visit) Please call patient to advise at [phone].  Thank you

## 2016-05-27 NOTE — Telephone Encounter (Signed)
Called and spoke with patient. Pt states that medication was giving her some relief but cough continues and would like to know if a refill can be called into pharmacy. I informed patient that I would forward message to Tor Netters. Pt verbalized understanding nothing further needed that this time.

## 2016-05-31 NOTE — Progress Notes (Addendum)
Subjective:   [name] is a 83 y.o. female who presents for Medicare Annual (Subsequent) preventive examination.  Review of Systems:  No ROS.  Medicare Wellness Visit. Cardiac Risk Factors include: advanced age (>56men, >2 women);dyslipidemia;hypertension Sleep patterns: Sleep 8-9 hrs per night.  Wakes 2-3 time per night to urinate.  Home Safety/Smoke Alarms: Feels safe in home. Smoke alarms in place. Pt also has life alert.  Living environment; residence and Firearm Safety: Lives alone in [place]. No guns. Seat Belt Safety/Bike Helmet: Wears seat belt   Counseling:   Eye Exam- Pt has trifocals.Dr.Digby annually.  Dental- Dr.LANE every 6 months.  Female:   Pap- Hysterectomy. [place] yearly. Normal per pt.    Mammo- Last 10/22/15-Recall 1 yr per report.       Dexa scan-  Last 07/04/15: Osteoporosis.      CCS-Last 03/05/03: polyps, diverticulosis, hemorrhoids. Pt no longer getting screened.     Objective:     Vitals: BP 126/64 (BP Location: Left Arm, Patient Position: Sitting, Cuff Size: Normal)   Pulse 70   Ht 5\' 4"  (1.626 m)   Wt 157 lb 12.8 oz (71.6 kg)   SpO2 97%   BMI 27.09 kg/m   Body mass index is 27.09 kg/m.   Tobacco History  Smoking Status  . Never Smoker  Smokeless Tobacco  . Never Used     Counseling given: Yes   Past Medical History:  Diagnosis Date  . Anxiety and depression   . Back pain    lumbar  . Fecal incontinence    Dr. Michail Sermon, likely due to loose stools in conjunction with pelvic muscle laxity from aging, recommended to stop Miralax  . GI (gastrointestinal bleed)    hx of colon polyps 2004, diverticulosis, internal hemmorhids  . Headache(784.0)   . Hydronephrosis    Right 2008 s/p urology eval CT/cysto: observation/monitor  . Hyperlipidemia   . Hypertension   . Knee pain    right chronic  . Migraine    on topamax , per neuro  . Osteoporosis   . Pulmonary nodules    Wert. First seen by CT only 05/04/03, no change  on f/u 08/08/06 -Macroscopic changes rml 04/27/07 > resolved april 6,2010 no further w/u   . Seborrheic keratosis    Left inferior posterior flank  . SVT (supraventricular tachycardia) (HCC)    sees Dr.Ross   Past Surgical History:  Procedure Laterality Date  . ABDOMINAL HYSTERECTOMY    . APPENDECTOMY    . BASAL CELL CARCINOMA EXCISION    . BLADDER REPAIR    . CATARACT EXTRACTION  2008   Dr.Digby  . CHOLECYSTECTOMY    . lumbar reconstructive surgery  01/07/2009  . R ovary removed    . ROTATOR CUFF REPAIR  9/-2011  . SHOULDER SURGERY  12-2011   LEFT  . TONSILLECTOMY    . TOTAL KNEE ARTHROPLASTY     right  . trigger finger surgery     L thumb (2014), R ring finger 05-2013   Family History  Problem Relation Age of Onset  . Breast cancer Mother     later in life  . Diabetes Mother     late in life  . Heart disease Mother     late onset  . Lung cancer Father     apparently had TB  . Heart disease Father     late onset  . Prostate cancer Father   . Colitis Brother   . Cancer Brother   .  Colon cancer Neg Hx    History  Sexual Activity  . Sexual activity: No    Outpatient Encounter Prescriptions as of 06/03/2016  Medication Sig  . acetaminophen (TYLENOL) 500 MG tablet Take 500 mg by mouth every 6 (six) hours as needed for moderate pain.  . Ascorbic Acid (VITAMIN C PO) Take 1 tablet by mouth 2 (two) times daily.   Marland Kitchen aspirin 81 MG tablet Take 81 mg by mouth at bedtime.   Marland Kitchen atorvastatin (LIPITOR) 40 MG tablet Take 1 tablet (40 mg total) by mouth at bedtime.  Marland Kitchen azelastine (ASTELIN) 0.1 % nasal spray Place 2 sprays into both nostrils 2 (two) times daily.  . benzonatate (TESSALON) 100 MG capsule Take 1 capsule (100 mg total) by mouth 2 (two) times daily as needed for cough.  . calcium gluconate 500 MG tablet Take 500 mg by mouth 2 (two) times daily. Reported on 07/04/2015  . celecoxib (CELEBREX) 200 MG capsule Take 200 mg by mouth daily.  Marland Kitchen CRANBERRY PO Take 1 capsule by mouth  daily.  Marland Kitchen desoximetasone (TOPICORT) [id number] % cream 1 APPLICATION APPLY ON THE SKIN TWICE A DAY APPLY TO RASH ON HANDS TWICE DAILY AS NEEDED FOR RASH  . diltiazem (CARDIZEM CD) 120 MG 24 hr capsule Take 1 capsule (120 mg total) by mouth daily.  Marland Kitchen gabapentin (NEURONTIN) 100 MG capsule Take 200-300 mg by mouth 3 (three) times daily as needed (pain).   . multivitamin (THERAGRAN) per tablet Take 1 tablet by mouth daily.    . NON FORMULARY Take 2 capsules by mouth 2 (two) times daily. [employer]	[place]  . Probiotic Product (PROBIOTIC DAILY PO) Take 1 capsule by mouth daily.   . pseudoephedrine-acetaminophen (TYLENOL SINUS) 30-500 MG TABS tablet Take 1 tablet by mouth every 4 (four) hours as needed (sinus congestion).  . tolterodine (DETROL LA) 4 MG 24 hr capsule Take 4 mg by mouth daily.  Marland Kitchen trimethoprim (TRIMPEX) 100 MG tablet Take 100 mg by mouth daily.   No facility-administered encounter medications on file as of 06/03/2016.     Activities of Daily Living In your present state of health, do you have any difficulty performing the following activities: 06/03/2016 05/10/2016  Hearing? N N  Vision? N N  Difficulty concentrating or making decisions? N N  Walking or climbing stairs? N N  Dressing or bathing? N N  Doing errands, shopping? N N  Preparing Food and eating ? N -  Using the Toilet? N -  In the past six months, have you accidently leaked urine? Y -  Do you have problems with loss of bowel control? N -  Managing your Medications? N -  Managing your Finances? N -  Housekeeping or managing your Housekeeping? N -  Some recent data might be hidden    Patient Care Team: Colon Branch, MD as PCP - General Wilford Corner, MD as Consulting Physician (Gastroenterology) Fleet Contras, MD as Consulting Physician (Nephrology) Erline Levine, MD as Consulting Physician (Neurosurgery) Netta Cedars, MD as Consulting Physician (Orthopedic Surgery) Orie Rout, MD as Referring Physician  (Neurology) Gaynelle Arabian, MD as Consulting Physician (Orthopedic Surgery) Roseanne Kaufman, MD as Consulting Physician (Orthopedic Surgery) Calvert Cantor, MD as Consulting Physician (Ophthalmology) Dian Queen, MD as Consulting Physician (Obstetrics and Gynecology) Danella Sensing, MD as Consulting Physician (Dermatology)    Assessment:    Physical assessment deferred to PCP.  Exercise Activities and Dietary recommendations Current Exercise Habits: The patient does not participate in regular exercise at present (Very  active lifestyle.), Exercise limited by: None identified   Diet (meal preparation, eat out, water intake, caffeinated beverages, dairy products, fruits and vegetables): on average, 3 meals per day   24 Hour Recall Breakfast: Strudels, 1 cup of coffee, pre cooked bacon Lunch: Ham Biscuit, coffee or tea or soda Dinner: Soup,milk Drinks about 3 glasses of water per day.  Goals    . Lose 10 pounds          Lose weight by eating healthier.       Fall Risk Fall Risk  06/03/2016 05/10/2016 01/05/2016 07/04/2015 04/17/2015  Falls in the past year? No No No No No   Depression Screen PHQ 2/9 Scores 06/03/2016 05/10/2016 01/05/2016 07/04/2015  PHQ - 2 Score 0 0 0 0  Exception Documentation - - - -     Cognitive Function MMSE - Mini Mental State Exam 06/03/2016  Orientation to time 5  Orientation to Place 5  Registration 3  Attention/ Calculation 5  Recall 3  Language- name 2 objects 2  Language- repeat 1  Language- follow 3 step command 3  Language- read & follow direction 1  Write a sentence 1  Copy design 1  Total score 30        Immunization History  Administered Date(s) Administered  . Influenza Split 12/23/2013  . Influenza Whole 03/19/2006, 02/03/2007, 04/29/2008, 12/21/2008  . Influenza, High Dose Seasonal PF 12/18/2012  . Influenza-Unspecified 12/18/2013, 12/19/2014, 01/02/2016  . Pneumococcal Conjugate-13 04/17/2015  . Pneumococcal Polysaccharide-23  01/18/2003, 01/18/2008  . Td 01/18/2003  . Tdap 12/20/2012   Screening Tests Health Maintenance  Topic Date Due  . ZOSTAVAX  02/02/1992  . TETANUS/TDAP  12/21/2022  . INFLUENZA VACCINE  Completed  . DEXA SCAN  Completed  . PNA vac Low Risk Adult  Completed      Plan:     Follow up with Dr.Paz as scheduled.  Continue to eat heart healthy diet (full of fruits, vegetables, whole grains, lean protein, water--limit salt, fat, and sugar intake) and increase physical activity as tolerated.  Continue doing brain stimulating activities (puzzles, reading, adult coloring books, staying active) to keep memory sharp.   During the course of the visit the patient was educated and counseled about the following appropriate screening and preventive services:   Vaccines to include Pneumoccal, Influenza, Hepatitis B, Td, Zostavax, HCl  Cardiovascular Disease  Colorectal cancer screening  Bone density screening  Diabetes screening  Glaucoma screening  Mammography/PAP  Nutrition counseling   Patient Instructions (the written plan) was given to the patient.   Naaman Plummer [place], [place]  06/03/2016  Kathlene November, MD

## 2016-05-31 NOTE — Progress Notes (Signed)
Pre visit review using our clinic review tool, if applicable. No additional management support is needed unless otherwise documented below in the visit note. 

## 2016-06-03 ENCOUNTER — Encounter: Payer: Self-pay | Admitting: *Deleted

## 2016-06-03 ENCOUNTER — Ambulatory Visit (INDEPENDENT_AMBULATORY_CARE_PROVIDER_SITE_OTHER): Payer: Medicare Other | Admitting: *Deleted

## 2016-06-03 VITALS — BP 126/64 | HR 70 | Ht 64.0 in | Wt 157.8 lb

## 2016-06-03 DIAGNOSIS — Z0000 Encounter for general adult medical examination without abnormal findings: Secondary | ICD-10-CM | POA: Diagnosis not present

## 2016-06-03 NOTE — Patient Instructions (Addendum)
Follow up with Dr.Paz as scheduled.  Continue to eat heart healthy diet (full of fruits, vegetables, whole grains, lean protein, water--limit salt, fat, and sugar intake) and increase physical activity as tolerated.  Continue doing brain stimulating activities (puzzles, reading, adult coloring books, staying active) to keep memory sharp.    Constipation, Adult Constipation is when a person:  Poops (has a bowel movement) fewer times in a week than normal.  Has a hard time pooping.  Has poop that is dry, hard, or bigger than normal. Follow these instructions at home: Eating and drinking  Eat foods that have a lot of fiber, such as:  Fresh fruits and vegetables.  Whole grains.  Beans.  Eat less of foods that are high in fat, low in fiber, or overly processed, such as:  [place] fries.  Hamburgers.  Cookies.  Candy.  Soda.  Drink enough fluid to keep your pee (urine) clear or pale yellow. General instructions  Exercise regularly or as told by your doctor.  Go to the restroom when you feel like you need to poop. Do not hold it in.  Take over-the-counter and prescription medicines only as told by your doctor. These include any fiber supplements.  Do pelvic floor retraining exercises, such as:  Doing deep breathing while relaxing your lower belly (abdomen).  Relaxing your pelvic floor while pooping.  Watch your condition for any changes.  Keep all follow-up visits as told by your doctor. This is important. Contact a doctor if:  You have pain that gets worse.  You have a fever.  You have not pooped for 4 days.  You throw up (vomit).  You are not hungry.  You lose weight.  You are bleeding from the anus.  You have thin, pencil-like poop (stool). Get help right away if:  You have a fever, and your symptoms suddenly get worse.  You leak poop or have blood in your poop.  Your belly feels hard or bigger than normal (is bloated).  You have very bad  belly pain.  You feel dizzy or you faint. This information is not intended to replace advice given to you by your health care provider. Make sure you discuss any questions you have with your health care provider. Document Released: 09/22/2007 Document Revised: 10/24/2015 Document Reviewed: 09/24/2015 Elsevier Interactive Patient Education  2017 [employer].

## 2016-06-05 ENCOUNTER — Other Ambulatory Visit: Payer: Self-pay | Admitting: Internal Medicine

## 2016-06-07 ENCOUNTER — Ambulatory Visit (INDEPENDENT_AMBULATORY_CARE_PROVIDER_SITE_OTHER): Payer: Medicare Other | Admitting: Family Medicine

## 2016-06-07 ENCOUNTER — Encounter: Payer: Self-pay | Admitting: Family Medicine

## 2016-06-07 VITALS — BP 118/68 | HR 70 | Temp 97.5°F | Wt 158.8 lb

## 2016-06-07 DIAGNOSIS — R05 Cough: Secondary | ICD-10-CM

## 2016-06-07 DIAGNOSIS — R059 Cough, unspecified: Secondary | ICD-10-CM

## 2016-06-07 MED ORDER — BENZONATATE 100 MG PO CAPS
100.0000 mg | ORAL_CAPSULE | Freq: Two times a day (BID) | ORAL | 0 refills | Status: DC | PRN
Start: 2016-06-07 — End: 2016-11-08

## 2016-06-07 MED ORDER — AMOXICILLIN 875 MG PO TABS: 875.0000 mg | ORAL_TABLET | Freq: Two times a day (BID) | ORAL | 0 refills | Status: DC

## 2016-06-07 NOTE — Progress Notes (Signed)
Subjective:    Patient ID: [name], female    DOB: October 16, 1929, 83 y.o.   MRN: [id number]  HPI This is an 83 yo female who presents today with cough x 1 month. She was seen 05/17/17 by me and diagnosed with viral URI with cough. Was given tessalon pearls with some improvement. Cough continues to be dry, worse with long periods of talking. Some clear nasal drainage and post nasal drainage. Is using azelastine, tylenol sinus, flonase daily. No headache. No ear pain or pressure, no sore throat. Does not think she has had a fever. Feels fatigued over last several days. Felt like symptoms had improved then she got worse. No wheezing, no SOB. Sleeping well, rarely bothered by cough at night. Has been able to do normal activities.   Past Medical History:  Diagnosis Date  . Anxiety and depression   . Back pain    lumbar  . Fecal incontinence    Dr. Michail Sermon, likely due to loose stools in conjunction with pelvic muscle laxity from aging, recommended to stop Miralax  . GI (gastrointestinal bleed)    hx of colon polyps 2004, diverticulosis, internal hemmorhids  . Headache(784.0)   . Hydronephrosis    Right 2008 s/p urology eval CT/cysto: observation/monitor  . Hyperlipidemia   . Hypertension   . Knee pain    right chronic  . Migraine    on topamax , per neuro  . Osteoporosis   . Pulmonary nodules    Wert. First seen by CT only 05/04/03, no change on f/u 08/08/06 -Macroscopic changes rml 04/27/07 > resolved april 6,2010 no further w/u   . Seborrheic keratosis    Left inferior posterior flank  . SVT (supraventricular tachycardia) ([facility])    sees Dr.Ross      Review of Systems Per HPI    Objective:   Physical Exam  Constitutional: She is oriented to person, place, and time. She appears well-developed and well-nourished. No distress.  HENT:  Head: Normocephalic and atraumatic.  Right Ear: Tympanic membrane, external ear and ear canal normal.  Left Ear: Tympanic membrane, external  ear and ear canal normal.  Nose: Nose normal.  Mouth/Throat: Oropharynx is clear and moist. No oropharyngeal exudate.  Eyes: Conjunctivae are normal.  Neck: Normal range of motion. Neck supple.  Cardiovascular: Normal rate, regular rhythm and normal heart sounds.   Pulmonary/Chest: Effort normal and breath sounds normal. No respiratory distress. She has no wheezes. She has no rales.  Lymphadenopathy:    She has no cervical adenopathy.  Neurological: She is alert and oriented to person, place, and time.  Skin: Skin is warm and dry. She is not diaphoretic.  Psychiatric: She has a normal mood and affect. Her behavior is normal. Judgment and thought content normal.  Vitals reviewed.        BP 118/68   Pulse 70   Temp 97.5 F (36.4 C) (Oral)   Wt 158 lb 12.8 oz (72 kg)   SpO2 97%   BMI 27.26 kg/m  Wt Readings from Last 3 Encounters:  06/07/16 158 lb 12.8 oz (72 kg)  06/03/16 157 lb 12.8 oz (71.6 kg)  05/17/16 159 lb 9.6 oz (72.4 kg)    Assessment & Plan:  1. Cough - given prolonged symptoms and improvement followed by worsening, will cover with antibiotic - RTC/ER precautions reviewed - amoxicillin (AMOXIL) 875 MG tablet; Take 1 tablet (875 mg total) by mouth 2 (two) times daily.  Dispense: 14 tablet; Refill: 0 - benzonatate (  TESSALON) 100 MG capsule; Take 1 capsule (100 mg total) by mouth 2 (two) times daily as needed for cough.  Dispense: 20 capsule; Refill: 0   Clarene Reamer, FNP-BC  [facility]  06/08/2016 5:36 AM

## 2016-06-07 NOTE — Patient Instructions (Signed)
I have sent an antibiotic to your pharmacy Please let me know if you are not feeling better in 5-7 days. If you develop a fever, let me or Dr. Larose Kells know.  If you develop shortness of breath call [phone].

## 2016-06-07 NOTE — Progress Notes (Signed)
Pre visit review using our clinic review tool, if applicable. No additional management support is needed unless otherwise documented below in the visit note. 

## 2016-08-22 ENCOUNTER — Other Ambulatory Visit: Payer: Self-pay | Admitting: Internal Medicine

## 2016-08-23 ENCOUNTER — Other Ambulatory Visit: Payer: Self-pay | Admitting: Internal Medicine

## 2016-09-08 ENCOUNTER — Ambulatory Visit: Payer: Medicare Other | Admitting: Internal Medicine

## 2016-10-11 ENCOUNTER — Other Ambulatory Visit: Payer: Self-pay | Admitting: Obstetrics and Gynecology

## 2016-10-11 DIAGNOSIS — Z1231 Encounter for screening mammogram for malignant neoplasm of breast: Secondary | ICD-10-CM

## 2016-10-27 ENCOUNTER — Ambulatory Visit
Admission: RE | Admit: 2016-10-27 | Discharge: 2016-10-27 | Disposition: A | Discharge status: Home / Self Care | Payer: Medicare Other | Source: Ambulatory Visit | Attending: Obstetrics and Gynecology | Admitting: Obstetrics and Gynecology

## 2016-10-27 DIAGNOSIS — Z1231 Encounter for screening mammogram for malignant neoplasm of breast: Secondary | ICD-10-CM

## 2016-11-08 ENCOUNTER — Encounter: Payer: Self-pay | Admitting: Internal Medicine

## 2016-11-08 ENCOUNTER — Ambulatory Visit (INDEPENDENT_AMBULATORY_CARE_PROVIDER_SITE_OTHER): Payer: Medicare Other | Admitting: Internal Medicine

## 2016-11-08 VITALS — BP 130/78 | HR 70 | Temp 98.2°F | Ht 64.0 in | Wt 159.0 lb

## 2016-11-08 DIAGNOSIS — M810 Age-related osteoporosis without current pathological fracture: Secondary | ICD-10-CM | POA: Diagnosis not present

## 2016-11-08 DIAGNOSIS — E785 Hyperlipidemia, unspecified: Secondary | ICD-10-CM

## 2016-11-08 DIAGNOSIS — I10 Essential (primary) hypertension: Secondary | ICD-10-CM

## 2016-11-08 LAB — BASIC METABOLIC PANEL
BUN: 15 mg/dL (ref 6–23)
CALCIUM: 9.5 mg/dL (ref 8.4–10.5)
CO2: 27 meq/L (ref 19–32)
Chloride: 106 mEq/L (ref 96–112)
Creatinine, Ser: 0.97 mg/dL (ref 0.40–1.20)
GFR: 58.04 mL/min — AB (ref >60.00)
GLUCOSE: 95 mg/dL (ref 70–99)
Potassium: 4.9 mEq/L (ref 3.5–5.1)
Sodium: 140 mEq/L (ref 135–145)

## 2016-11-08 LAB — LIPID PANEL
CHOL/HDL RATIO: 3
Cholesterol: 125 mg/dL (ref 0–200)
HDL: 45.3 mg/dL (ref >39.00)
LDL CALC: 48 mg/dL (ref 0–99)
NonHDL: 79.44
Triglycerides: 155 mg/dL — ABNORMAL HIGH (ref 0.0–149.0)
VLDL: 31 mg/dL (ref 0.0–40.0)

## 2016-11-08 LAB — AST: AST: 14 U/L (ref 0–37)

## 2016-11-08 LAB — ALT: ALT: 8 U/L (ref 0–35)

## 2016-11-08 NOTE — Assessment & Plan Note (Signed)
HTN: Continue Cardizem, check a BMP Hyperlipidemia: Currently on Lipitor, check a FLP, AST, ALT Constipation: Reports constipation lately, will stop calcium supplements. Osteoporosis: See previous note, she decided not to proceed with prolia. GU: Sees urology regularly, used to be on Detrol, it was stopped because she feels somewhat confused. Will see urology next month. RTC 04-2017 CPX

## 2016-11-08 NOTE — Patient Instructions (Signed)
GO TO THE LAB : Get the blood work     GO TO THE FRONT DESK Schedule your next appointment for a  physical exam by January 2018

## 2016-11-08 NOTE — Progress Notes (Signed)
Pre visit review using our clinic review tool, if applicable. No additional management support is needed unless otherwise documented below in the visit note. 

## 2016-11-08 NOTE — Progress Notes (Signed)
Subjective:    Patient ID: [name], female    DOB: 17-May-1929, 83 y.o.   MRN: [id number]  DOS:  11/08/2016 Type of visit - description : rov Interval history: In general feeling well, has no major concerns. Labs reviewed, due for a FLP, AST, ALT, BMP.   Review of Systems Denies chest pain or difficulty breathing. No lower extremity edema Has on and off constipation, stop calcium supplements?.   Past Medical History:  Diagnosis Date  . Anxiety and depression   . Back pain    lumbar  . Fecal incontinence    Dr. Michail Sermon, likely due to loose stools in conjunction with pelvic muscle laxity from aging, recommended to stop Miralax  . GI (gastrointestinal bleed)    hx of colon polyps 2004, diverticulosis, internal hemmorhids  . Headache(784.0)   . Hydronephrosis    Right 2008 s/p urology eval CT/cysto: observation/monitor  . Hyperlipidemia   . Hypertension   . Knee pain    right chronic  . Migraine    on topamax , per neuro  . Osteoporosis   . Pulmonary nodules    [place]. First seen by CT only 05/04/03, no change on f/u 08/08/06 -Macroscopic changes rml 04/27/07 > resolved april 6,2010 no further w/u   . Seborrheic keratosis    Left inferior posterior flank  . SVT (supraventricular tachycardia) ([facility])    sees Dr.Ross    Past Surgical History:  Procedure Laterality Date  . ABDOMINAL HYSTERECTOMY    . APPENDECTOMY    . BASAL CELL CARCINOMA EXCISION    . BLADDER REPAIR    . CATARACT EXTRACTION  2008   Dr.Digby  . CHOLECYSTECTOMY    . lumbar reconstructive surgery  01/07/2009  . R ovary removed    . ROTATOR CUFF REPAIR  9/-2011  . SHOULDER SURGERY  12-2011   LEFT  . TONSILLECTOMY    . TOTAL KNEE ARTHROPLASTY     right  . trigger finger surgery     L thumb (2014), R ring finger 05-2013    Social History   Social History  . Marital status: Widowed    Spouse name: N/A  . Number of children: 1  . Years of education: N/A   Occupational History  . retired       Social History Main Topics  . Smoking status: Never Smoker  . Smokeless tobacco: Never Used  . Alcohol use Yes     Comment: rarely  . Drug use: No  . Sexual activity: No   Other Topics Concern  . Not on file   Social History Narrative   Moved from a retirement home to a condominium 04-2013, independent living,  lives by herself , drives    Son lives in [place] as of 11/08/2016      Reactions   Amitriptyline Hcl    REACTION: hallucinations   Carisoprodol    REACTION: nightmares,rash,tachycardia   Codeine    REACTION: "deathly ill" nauesa, dizzy,weak   Fosamax [alendronate Sodium] Other (See Comments)   Dental issues   Meloxicam    REACTION: sensitive to sun; facial redness; may cause severe abd pain   Metronidazole    REACTION: abd pain   Nortriptyline Hcl    REACTION: hallucinations   Oxaprozin    REACTION: abd pain   Promethazine Hcl    REACTION: spastic arms and legs flailing while awake and unusual behavior while sleep - walking all over house during night  Rofecoxib    REACTION: rash   Sumatriptan    REACTION: rash   Tramadol Other (See Comments)   Dizziness and constipation      Medication List       Accurate as of 11/08/16  5:27 PM. Always use your most recent med list.          acetaminophen 500 MG tablet Commonly known as:  TYLENOL Take 500 mg by mouth every 6 (six) hours as needed for moderate pain.   amoxicillin 875 MG tablet Commonly known as:  AMOXIL Take 1 tablet (875 mg total) by mouth 2 (two) times daily.   aspirin 81 MG tablet Take 81 mg by mouth at bedtime.   atorvastatin 40 MG tablet Commonly known as:  LIPITOR Take 1 tablet (40 mg total) by mouth at bedtime.   azelastine 0.1 % nasal spray Commonly known as:  ASTELIN Place 2 sprays into both nostrils 2 (two) times daily.   celecoxib 200 MG capsule Commonly known as:  CELEBREX Take 200 mg by mouth daily.   CRANBERRY PO Take 1 capsule by mouth daily.    cyclobenzaprine 10 MG tablet Commonly known as:  FLEXERIL Take 10 mg by mouth 3 (three) times daily as needed for muscle spasms.   desoximetasone 0.25 % cream Commonly known as:  TOPICORT 1 APPLICATION APPLY ON THE SKIN TWICE A DAY APPLY TO RASH ON HANDS TWICE DAILY AS NEEDED FOR RASH   diltiazem 120 MG 24 hr capsule Commonly known as:  CARDIZEM CD Take 1 capsule (120 mg total) by mouth daily.   gabapentin 100 MG capsule Commonly known as:  NEURONTIN Take 200-300 mg by mouth 3 (three) times daily as needed (pain).   multivitamin per tablet Take 1 tablet by mouth daily.   NON FORMULARY Take 2 capsules by mouth 2 (two) times daily. [facility] Eye   PROBIOTIC DAILY PO Take 1 capsule by mouth daily.   trimethoprim 100 MG tablet Commonly known as:  TRIMPEX Take 100 mg by mouth daily.   VITAMIN C PO Take 1 tablet by mouth 2 (two) times daily.          Objective:   Physical Exam BP 130/78 (BP Location: Left Arm, Patient Position: Sitting, Cuff Size: Small)   Pulse 70   Temp 98.2 F (36.8 C) (Oral)   Ht 5\' 4"  (1.626 m)   Wt 159 lb (72.1 kg)   SpO2 97%   BMI 27.29 kg/m  General:   Well developed, well nourished . NAD.  HEENT:  Normocephalic . Face symmetric, atraumatic Lungs:  CTA B Normal respiratory effort, no intercostal retractions, no accessory muscle use. Heart: RRR,  no murmur.  No pretibial edema bilaterally  Skin: Not pale. Not jaundice Neurologic:  alert & oriented X3.  Speech normal, gait appropriate for age and unassisted Psych--  Cognition and judgment appear intact.  Cooperative with normal attention span and concentration.  Behavior appropriate. No anxious or depressed appearing.      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 SVT, saw Dr. Harrington Challenger 2014, on cardiazem Migraines, on Topamax-gabapentin, flexeril prn sees neurology (Dr Domingo Cocking)   GI: Fecal incontinence, GI Dr. Michail Sermon H/o SBO 08-2015 Pulmonary nodules First seen by CT only 05/04/03, no change on f/u 08/08/06 -Macroscopic changes rml 04/27/07 > resolved april 6,2010 no further w/u  Nodules noted again: CT 08/2015  CT 04-2016: Stable, recheck in one year if high risk only. [place]  Sees Dermatology     PLAN: HTN: Continue Cardizem, check a BMP Hyperlipidemia: Currently on Lipitor, check a FLP, AST, ALT Constipation: Reports constipation lately, will stop calcium supplements. Osteoporosis: See previous note, she decided not to proceed with prolia. GU: Sees urology regularly, used to be on Detrol, it was stopped because she feels somewhat confused. Will see urology next month. RTC 04-2017 CPX

## 2017-02-13 ENCOUNTER — Other Ambulatory Visit: Payer: Self-pay | Admitting: Internal Medicine

## 2017-06-01 ENCOUNTER — Encounter: Payer: Medicare Other | Admitting: Internal Medicine

## 2017-06-17 ENCOUNTER — Encounter: Payer: Medicare Other | Admitting: Internal Medicine

## 2017-07-06 ENCOUNTER — Ambulatory Visit: Payer: Medicare Other | Admitting: Family Medicine

## 2017-07-06 ENCOUNTER — Encounter: Payer: Self-pay | Admitting: Family Medicine

## 2017-07-06 VITALS — BP 132/78 | HR 70 | Temp 97.4°F | Ht 64.0 in | Wt 162.1 lb

## 2017-07-06 DIAGNOSIS — R42 Dizziness and giddiness: Secondary | ICD-10-CM | POA: Diagnosis not present

## 2017-07-06 DIAGNOSIS — I10 Essential (primary) hypertension: Secondary | ICD-10-CM

## 2017-07-06 LAB — CBC WITH DIFFERENTIAL/PLATELET
BASOS PCT: 0.5 % (ref 0.0–3.0)
Basophils Absolute: 0.1 10*3/uL (ref 0.0–0.1)
EOS PCT: 1.6 % (ref 0.0–5.0)
Eosinophils Absolute: 0.2 10*3/uL (ref 0.0–0.7)
HCT: 36.8 % (ref 36.0–46.0)
Hemoglobin: 12.4 g/dL (ref 12.0–15.0)
LYMPHS ABS: 1.1 10*3/uL (ref 0.7–4.0)
Lymphocytes Relative: 10 % — ABNORMAL LOW (ref 12.0–46.0)
MCHC: 33.6 g/dL (ref 30.0–36.0)
MCV: 94.2 fl (ref 78.0–100.0)
MONO ABS: 1 10*3/uL (ref 0.1–1.0)
MONOS PCT: 8.8 % (ref 3.0–12.0)
NEUTROS ABS: 8.7 10*3/uL — AB (ref 1.4–7.7)
NEUTROS PCT: 79.1 % — AB (ref 43.0–77.0)
Platelets: 359 10*3/uL (ref 150.0–400.0)
RBC: 3.91 Mil/uL (ref 3.87–5.11)
RDW: 14.2 % (ref 11.5–15.5)
WBC: 11 10*3/uL — ABNORMAL HIGH (ref 4.0–10.5)

## 2017-07-06 NOTE — Progress Notes (Signed)
Chief Complaint  Patient presents with  . Hypertension  . Dizziness    Subjective [name] is a 84 y.o. female who presents for hypertension follow up. She does not monitor home blood pressures. She is compliant with medications. Patient has these side effects of medication: none She is adhering to a healthy diet overall. Current exercise: some walking, physically active She has been having light headedness over past couple days. Worse when she leans forward/rises.  She is not spinning.  Denies fevers, recent injury, headache, vision changes, difficulty swallowing, trouble speech, numbness, tingling, weakness, passing out, or palpitations.   Past Medical History:  Diagnosis Date  . Anxiety and depression   . Back pain    lumbar  . Fecal incontinence    Dr. Michail Sermon, likely due to loose stools in conjunction with pelvic muscle laxity from aging, recommended to stop Miralax  . GI (gastrointestinal bleed)    hx of colon polyps 2004, diverticulosis, internal hemmorhids  . Headache(784.0)   . Hydronephrosis    Right 2008 s/p urology eval CT/cysto: observation/monitor  . Hyperlipidemia   . Hypertension   . Knee pain    right chronic  . Migraine    on topamax , per neuro  . Osteoporosis   . Pulmonary nodules    Wert. First seen by CT only 05/04/03, no change on f/u 08/08/06 -Macroscopic changes rml 04/27/07 > resolved april 6,2010 no further w/u   . Seborrheic keratosis    Left inferior posterior flank  . SVT (supraventricular tachycardia) ([facility])    sees Dr.Ross     Review of Systems Cardiovascular: no chest pain Respiratory:  no shortness of breath  Exam BP 132/78 (BP Location: Left Arm, Patient Position: Sitting, Cuff Size: Normal)   Pulse 70   Temp (!) 97.4 F (36.3 C) (Oral)   Ht 5\' 4"  (1.626 m)   Wt 162 lb 2 oz (73.5 kg)   SpO2 96%   BMI 27.83 kg/m  General:  well developed, well nourished, in no apparent distress Skin: warm, no pallor or diaphoresis Eyes:  pupils equal and round, sclera anicteric without injection Heart: RRR, no LE edema Lungs: clear to auscultation, no accessory muscle use Neuro: DTRs equal and symmetric, Dix-Hallpike negative bilaterally Psych: well oriented with normal range of affect and appropriate judgment/insight  Essential hypertension  Light headed - Plan: CBC w/Diff, Comprehensive metabolic panel  Orders as above. No changes in meds for now, check BP's at home. Ck labs. Does not sound like a cerebellar stroke given presentation and duration.  Counseled on diet and exercise F/u in 2 d with reg pcp. The patient voiced understanding and agreement to the plan.  C[place], DO 07/06/17  5:02 PM

## 2017-07-06 NOTE — Patient Instructions (Signed)
Keep pushing fluids and eat regular meals.  Around 3 times per week, check your blood pressure 2 times per day. Once in the morning and once in the evening. The readings should be at least one minute apart. Write down these values and bring them to your next visit with Dr. Larose Kells or for a nurse visit.  Let us know if you need anything.

## 2017-07-06 NOTE — Progress Notes (Signed)
Pre visit review using our clinic review tool, if applicable. No additional management support is needed unless otherwise documented below in the visit note. 

## 2017-07-07 LAB — COMPREHENSIVE METABOLIC PANEL
ALK PHOS: 84 U/L (ref 39–117)
ALT: 9 U/L (ref 0–35)
AST: 16 U/L (ref 0–37)
Albumin: 4.3 g/dL (ref 3.5–5.2)
BUN: 18 mg/dL (ref 6–23)
CHLORIDE: 102 meq/L (ref 96–112)
CO2: 30 mEq/L (ref 19–32)
Calcium: 10.4 mg/dL (ref 8.4–10.5)
Creatinine, Ser: 0.91 mg/dL (ref 0.40–1.20)
GFR: 62.38 mL/min (ref >60.00)
GLUCOSE: 119 mg/dL — AB (ref 70–99)
POTASSIUM: 4.8 meq/L (ref 3.5–5.1)
SODIUM: 140 meq/L (ref 135–145)
TOTAL PROTEIN: 6.4 g/dL (ref 6.0–8.3)
Total Bilirubin: 0.4 mg/dL (ref 0.2–1.2)

## 2017-07-08 ENCOUNTER — Encounter: Payer: Self-pay | Admitting: Internal Medicine

## 2017-07-08 ENCOUNTER — Ambulatory Visit (INDEPENDENT_AMBULATORY_CARE_PROVIDER_SITE_OTHER): Payer: Medicare Other | Admitting: Internal Medicine

## 2017-07-08 VITALS — BP 122/68 | HR 74 | Temp 98.0°F | Resp 14 | Ht 64.0 in | Wt 160.5 lb

## 2017-07-08 DIAGNOSIS — F411 Generalized anxiety disorder: Secondary | ICD-10-CM | POA: Diagnosis not present

## 2017-07-08 DIAGNOSIS — M810 Age-related osteoporosis without current pathological fracture: Secondary | ICD-10-CM | POA: Diagnosis not present

## 2017-07-08 DIAGNOSIS — Z0000 Encounter for general adult medical examination without abnormal findings: Secondary | ICD-10-CM

## 2017-07-08 DIAGNOSIS — F329 Major depressive disorder, single episode, unspecified: Secondary | ICD-10-CM

## 2017-07-08 DIAGNOSIS — N182 Chronic kidney disease, stage 2 (mild): Secondary | ICD-10-CM

## 2017-07-08 DIAGNOSIS — E785 Hyperlipidemia, unspecified: Secondary | ICD-10-CM

## 2017-07-08 DIAGNOSIS — H81399 Other peripheral vertigo, unspecified ear: Secondary | ICD-10-CM | POA: Diagnosis not present

## 2017-07-08 DIAGNOSIS — F32A Depression, unspecified: Secondary | ICD-10-CM

## 2017-07-08 NOTE — Progress Notes (Signed)
Pre visit review using our clinic review tool, if applicable. No additional management support is needed unless otherwise documented below in the visit note. 

## 2017-07-08 NOTE — Patient Instructions (Addendum)
  GO TO THE FRONT DESK Schedule your next appointment for a checkup in 3 months  We will order an MRI of the brain  If you have more dizziness please let me know   Check the  blood pressure 2 or 3 times a  week  Be sure your blood pressure is between 110/65 and  135/85. If it is consistently higher or lower, let me know

## 2017-07-08 NOTE — Progress Notes (Signed)
Subjective:    Patient ID: [name], female    DOB: 08/14/1929, 84 y.o.   MRN: [id number]  DOS:  07/08/2017 Type of visit - description : cpx Interval history: Here for a CPX, multiple other issues discussed This week, her BP was checked x 1 and it was in the 190s. For 2 days she felt very dizzy, dizziness lasted approximately 48 hours, not on and off, it was a steady feeling. She felt imbalance.  Did not have any nausea, vomiting.  No headaches.  No recent head injury. She is now back to normal. She is concerned about having cancer due to the recent abnormal CBC (WBC was 11).  I told her that such a small increase in WBCs does not mean cancer. She is also wonder if she has atrial fibrillation "because all the commercials in the TV, maybe I have it", I reviewed her last EKG:  NSR.      Review of Systems Denies any recent chest pain, difficulty breathing or palpitations. No diplopia, slurred speech, facial numbness, motor deficits. She is having feet and ankle pain, under the care of other providers. She takes Celebrex daily. She is also taking antibiotics Rx by urology.  Other than above, a 14 point review of systems is negative     Past Medical History:  Diagnosis Date  . Anxiety and depression   . Back pain    lumbar  . Fecal incontinence    Dr. Michail Sermon, likely due to loose stools in conjunction with pelvic muscle laxity from aging, recommended to stop Miralax  . GI (gastrointestinal bleed)    hx of colon polyps 2004, diverticulosis, internal hemmorhids  . Headache(784.0)   . Hydronephrosis    Right 2008 s/p urology eval CT/cysto: observation/monitor  . Hyperlipidemia   . Hypertension   . Knee pain    right chronic  . Migraine    on topamax , per neuro  . Osteoporosis   . Pulmonary nodules    Wert. First seen by CT only 05/04/03, no change on f/u 08/08/06 -Macroscopic changes rml 04/27/07 > resolved april 6,2010 no further w/u   . Seborrheic keratosis    Left inferior posterior flank  . SVT (supraventricular tachycardia) (HCC)    sees Dr.Ross    Past Surgical History:  Procedure Laterality Date  . ABDOMINAL HYSTERECTOMY    . APPENDECTOMY    . BASAL CELL CARCINOMA EXCISION    . BLADDER REPAIR    . CATARACT EXTRACTION  2008   Dr.Digby  . CHOLECYSTECTOMY    . lumbar reconstructive surgery  01/07/2009  . R ovary removed    . ROTATOR CUFF REPAIR  9/-2011  . SHOULDER SURGERY  12-2011   LEFT  . TONSILLECTOMY    . TOTAL KNEE ARTHROPLASTY     right  . trigger finger surgery     L thumb (2014), R ring finger 05-2013    Social History   Socioeconomic History  . Marital status: Widowed    Spouse name: Not on file  . Number of children: 1  . Years of education: Not on file  . Highest education level: Not on file  Occupational History  . Occupation: retired   Scientific laboratory technician  . Financial resource strain: Not on file  . Food insecurity:    Worry: Not on file    Inability: Not on file  . Transportation needs:    Medical: Not on file    Non-medical: Not on file  Tobacco  Use  . Smoking status: Never Smoker  . Smokeless tobacco: Never Used  Substance and Sexual Activity  . Alcohol use: Yes    Comment: rarely  . Drug use: No  . Sexual activity: Never  Lifestyle  . Physical activity:    Days per week: Not on file    Minutes per session: Not on file  . Stress: Not on file  Relationships  . Social connections:    Talks on phone: Not on file    Gets together: Not on file    Attends religious service: Not on file    Active member of club or organization: Not on file    Attends meetings of clubs or organizations: Not on file    Relationship status: Not on file  . Intimate partner violence:    Fear of current or ex partner: Not on file    Emotionally abused: Not on file    Physically abused: Not on file    Forced sexual activity: Not on file  Other Topics Concern  . Not on file  Social History Narrative   Moved from a  retirement home to a condominium 04-2013, independent living,  lives by herself , drives    Son lives in [place]     Family History  Problem Relation Age of Onset  . Breast cancer Mother        later in life  . Diabetes Mother        late in life  . Heart disease Mother        late onset  . Lung cancer Father        apparently had TB  . Heart disease Father        late onset  . Prostate cancer Father   . Colitis Brother   . Cancer Brother   . Colon cancer Neg Hx      Allergies as of 07/08/2017      Reactions   Amitriptyline Hcl    REACTION: hallucinations   Carisoprodol    REACTION: nightmares,rash,tachycardia   Codeine    REACTION: "deathly ill" nauesa, dizzy,weak   Fosamax [alendronate Sodium] Other (See Comments)   Dental issues   Meloxicam    REACTION: sensitive to sun; facial redness; may cause severe abd pain   Metronidazole    REACTION: abd pain   Nortriptyline Hcl    REACTION: hallucinations   Oxaprozin    REACTION: abd pain   Promethazine Hcl    REACTION: spastic arms and legs flailing while awake and unusual behavior while sleep - walking all over house during night   Rofecoxib    REACTION: rash   Sumatriptan    REACTION: rash   Tramadol Other (See Comments)   Dizziness and constipation      Medication List        Accurate as of 07/08/17 11:59 PM. Always use your most recent med list.          acetaminophen 500 MG tablet Commonly known as:  TYLENOL Take 500 mg by mouth every 6 (six) hours as needed for moderate pain.   amoxicillin-clavulanate 875-125 MG tablet Commonly known as:  AUGMENTIN Prior to dental appts   aspirin 81 MG tablet Take 81 mg by mouth at bedtime.   atorvastatin 40 MG tablet Commonly known as:  LIPITOR Take 1 tablet (40 mg total) by mouth at bedtime.   BLUE-EMU MAXIMUM STRENGTH 2.5 % Liqd Generic drug:  Menthol (Topical Analgesic) Apply topically as needed.  celecoxib 200 MG capsule Commonly known as:   CELEBREX Take 200 mg by mouth daily.   CRANBERRY PO Take 1 capsule by mouth daily.   cyclobenzaprine 10 MG tablet Commonly known as:  FLEXERIL Take 10 mg by mouth 3 (three) times daily as needed for muscle spasms.   diltiazem 120 MG 24 hr capsule Commonly known as:  TIAZAC Take 1 capsule (120 mg total) by mouth daily.   gabapentin 100 MG capsule Commonly known as:  NEURONTIN Take 200-300 mg by mouth 3 (three) times daily as needed (pain).   multivitamin per tablet Take 1 tablet by mouth daily.   NON FORMULARY Take 2 capsules by mouth 2 (two) times daily. Hydro Eye   PROBIOTIC DAILY PO Take 2 capsules by mouth daily.   trimethoprim 100 MG tablet Commonly known as:  TRIMPEX Take 100 mg by mouth daily.   VITAMIN C PO Take 1 tablet by mouth 2 (two) times daily.   YUVAFEM 10 MCG Tabs vaginal tablet Generic drug:  Estradiol Place vaginally. Twice weekly          Objective:   Physical Exam BP 122/68 (BP Location: Left Arm, Patient Position: Sitting, Cuff Size: Small)   Pulse 74   Temp 98 F (36.7 C) (Oral)   Resp 14   Ht 5\' 4"  (1.626 m)   Wt 160 lb 8 oz (72.8 kg)   SpO2 96%   BMI 27.55 kg/m  General:   Well developed, well nourished . NAD.  Neck: No carotid pulses, no bruit  HEENT:  Normocephalic . Face symmetric, atraumatic.  EOMI, pupils equal and reactive Lungs:  CTA B Normal respiratory effort, no intercostal retractions, no accessory muscle use. Heart: RRR,  no murmur.  No pretibial edema bilaterally  Abdomen:  Not distended, soft, non-tender. No rebound or rigidity.   Skin: Exposed areas without rash. Not pale. Not jaundice Neurologic:  alert & oriented X3.  Speech normal, gait appropriate for age and unassisted ; needs some help transferring. Strength symmetric and appropriate for age.  DTRs symmetric.  Face symmetric. Psych: Cognition and judgment appear intact.  Cooperative with normal attention span and concentration.  Behavior  appropriate. No anxious or depressed appearing.     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 SVT, saw Dr. Harrington Challenger 2014, on cardiazem Migraines, on Topamax-gabapentin, flexeril prn sees neurology (Dr Domingo Cocking)   GI: Fecal incontinence, GI Dr. Michail Sermon H/o SBO 08-2015 Pulmonary nodules First seen by CT only 05/04/03, no change on f/u 08/08/06 -Macroscopic changes rml 04/27/07 > resolved april 6,2010 no further w/u  Nodules noted again: CT 08/2015  CT 04-2016: Stable, recheck in one year if high risk only. [facility]  Sees Dermatology  GU: Sees urology regularly, on daily antibiotics  PLAN: Dizziness: Episode of dizziness last week, lasted for 2 days, somewhat concerning for a stroke/TIA.  Neuro exam (-) today, she is now ASX.  Plan:  MRI brain HTN: Was elevated last week, now back to normal.  Recent CMP satisfactory. Continue diltiazem, monitor BPs. Hyperlipidemia: On Lipitor, FLP 10-2016 satisfactory.  No change.  Last LFTs normal Anxiety, depression: She is somewhat apprehension mostly about her health, counseled. CKD: h/o single kidney, last creat wnl; she takes daily Celebrex, remind her of the risk of decrease kidney function with COX-i.  See note from nephrology 2014.  Her QOL is greatly help with Celebrex so she is reluctant to stop.   Osteoporosis: We talk again about the risk of fractures,  she again declined  Fosamax and Prolia (she thinks she is unable to take Prolia due to  CKD.  I assured her that is not the case.). RTC 3 months, call if dizziness resurface  Today, in addition to her CPX I spent more than 20   min with the patient: >50% of the time counseling regards  Her dizziness and need to get a MRI, risk of kidney problems w/ Celebrex , managing chronic medical problems

## 2017-07-08 NOTE — Assessment & Plan Note (Addendum)
-  Td 2014; shingles shot 2007 ; PNM 23 2004, 2009 ; prevnar: 2016; states she had a Zostavax (no documentation) -CCS: Colonoscopy w/ Dr Deatra Ina, + polyp 11-04  colonoscopy 04-2008 @ [facility] : Hemorrhoids, diverticuli. No polyps  - female care , next appoint w/ gyn 07-2017 Last PAP per [facility] 05/2014 [facility]-- 10-2016  -Counseled : diet , exercise

## 2017-07-09 DIAGNOSIS — M810 Age-related osteoporosis without current pathological fracture: Secondary | ICD-10-CM | POA: Insufficient documentation

## 2017-07-09 NOTE — Assessment & Plan Note (Signed)
Dizziness: Episode of dizziness last week, lasted for 2 days, somewhat concerning for a stroke/TIA.  Neuro exam (-) today, she is now ASX.  Plan:  MRI brain HTN: Was elevated last week, now back to normal.  Recent CMP satisfactory. Continue diltiazem, monitor BPs. Hyperlipidemia: On Lipitor, FLP 10-2016 satisfactory.  No change.  Last LFTs normal Anxiety, depression: She is somewhat apprehension mostly about her health, counseled. CKD: h/o single kidney, last creat wnl; she takes daily Celebrex, remind her of the risk of decrease kidney function with COX-i.    See note from nephrology 2014.  Her QOL is greatly help with Celebrex so she is reluctant to stop.   Osteoporosis: We talk again about the risk of fractures,  she again declined  Fosamax and Prolia (she thinks she is unable to take Prolia due to CKD.  I assured her that is not the case.). RTC 3 months, call if dizziness resurface

## 2017-07-14 ENCOUNTER — Encounter: Payer: Self-pay | Admitting: Family Medicine

## 2017-07-14 ENCOUNTER — Ambulatory Visit: Payer: Medicare Other | Admitting: Family Medicine

## 2017-07-14 VITALS — BP 132/78 | HR 92 | Temp 98.7°F | Ht 64.0 in | Wt 158.2 lb

## 2017-07-14 DIAGNOSIS — J301 Allergic rhinitis due to pollen: Secondary | ICD-10-CM | POA: Diagnosis not present

## 2017-07-14 DIAGNOSIS — R05 Cough: Secondary | ICD-10-CM

## 2017-07-14 DIAGNOSIS — R058 Other specified cough: Secondary | ICD-10-CM

## 2017-07-14 MED ORDER — BENZONATATE 100 MG PO CAPS: 100.0000 mg | ORAL_CAPSULE | Freq: Three times a day (TID) | ORAL | 0 refills | Status: DC | PRN

## 2017-07-14 NOTE — Patient Instructions (Addendum)
Claritin (loratadine), Allegra (fexofenadine), Zyrtec (cetirizine); these are listed in order from weakest to strongest. Generic, and therefore cheaper, options are in the parentheses.   Flonase (fluticasone); nasal spray that is over the counter. 2 sprays each nostril, once daily. Aim towards the same side eye when you spray.  There are available OTC, and the generic versions, which may be cheaper, are in parentheses. Show this to a pharmacist if you have trouble finding any of these items.  Let us know if you need anything.  

## 2017-07-14 NOTE — Progress Notes (Signed)
Chief Complaint  Patient presents with  . Cough    Lynder Parents here for URI complaints.  Duration: 3 days  Associated symptoms: sinus congestion, rhinorrhea and cough Denies: sinus pain, itchy watery eyes, ear pain, ear drainage, sore throat, wheezing, shortness of breath, myalgia and fevers Treatment to date: loratadine Sick contacts: No  ROS:  Const: Denies fevers HEENT: As noted in HPI Lungs: No SOB  Past Medical History:  Diagnosis Date  . Anxiety and depression   . Back pain    lumbar  . Fecal incontinence    Dr. Michail Sermon, likely due to loose stools in conjunction with pelvic muscle laxity from aging, recommended to stop Miralax  . GI (gastrointestinal bleed)    hx of colon polyps 2004, diverticulosis, internal hemmorhids  . Headache(784.0)   . Hydronephrosis    Right 2008 s/p urology eval CT/cysto: observation/monitor  . Hyperlipidemia   . Hypertension   . Knee pain    right chronic  . Migraine    on topamax , per neuro  . Osteoporosis   . Pulmonary nodules    Wert. First seen by CT only 05/04/03, no change on f/u 08/08/06 -Macroscopic changes rml 04/27/07 > resolved april 6,2010 no further w/u   . Seborrheic keratosis    Left inferior posterior flank  . SVT (supraventricular tachycardia) (HCC)    sees Dr.Ross   Family History  Problem Relation Age of Onset  . Breast cancer Mother        later in life  . Diabetes Mother        late in life  . Heart disease Mother        late onset  . Lung cancer Father        apparently had TB  . Heart disease Father        late onset  . Prostate cancer Father   . Colitis Brother   . Cancer Brother   . Colon cancer Neg Hx     BP 132/78 (BP Location: Left Arm, Patient Position: Sitting, Cuff Size: Normal)   Pulse 92   Temp 98.7 F (37.1 C) (Oral)   Ht 5\' 4"  (1.626 m)   Wt 158 lb 4 oz (71.8 kg)   SpO2 95%   BMI 27.16 kg/m  General: Awake, alert, appears stated age HEENT: AT, [place], ears patent b/l and TM's  neg, nares patent w/o discharge, pharynx pink and without exudates, MMM Neck: No masses or asymmetry Heart: RRR Lungs: CTAB, no accessory muscle use Psych: Age appropriate judgment and insight, normal mood and affect  Seasonal allergic rhinitis due to pollen  Dry cough - Plan: benzonatate (TESSALON) 100 MG capsule  Orders as above. Cont Loratadine. OTC options also provided.  F/u prn.  Pt voiced understanding and agreement to the plan.  [place], DO 07/14/17 2:26 PM

## 2017-07-14 NOTE — Progress Notes (Signed)
Pre visit review using our clinic review tool, if applicable. No additional management support is needed unless otherwise documented below in the visit note. 

## 2017-07-16 ENCOUNTER — Ambulatory Visit (HOSPITAL_BASED_OUTPATIENT_CLINIC_OR_DEPARTMENT_OTHER)
Admission: RE | Admit: 2017-07-16 | Discharge: 2017-07-16 | Disposition: A | Discharge status: Home / Self Care | Payer: Medicare Other | Source: Ambulatory Visit | Attending: Internal Medicine | Admitting: Internal Medicine

## 2017-07-16 DIAGNOSIS — H81399 Other peripheral vertigo, unspecified ear: Secondary | ICD-10-CM | POA: Insufficient documentation

## 2017-07-27 ENCOUNTER — Ambulatory Visit: Payer: Self-pay | Admitting: *Deleted

## 2017-07-27 NOTE — Telephone Encounter (Signed)
Pt states dizziness x 3 months "at least." Has been evaluated and MRI done 07/17/17. Pt states "Not any worse, just not any better and I need to get some answers." Denies any headaches, CP, one-sided weakness; no facial droop, dysphagia, no speech difficulties or visual disturbances. Pt states Dr. Larose Kells aware of previous falls. States gait unsteady, but no change from previously. "I have to use my cane and hold onto things." Dizziness worsens with "leaning over." States room does not spin "But sometimes it feels like my head is spinning." Has been checking B/P and HR at home states "Good, as it has been." HR 65. States has been staying hydrated, denies any congestion, earaches. Appt made with Dr. Larose Kells for Thursday 08/04/17. Pt requested date; NT offered earlier appt, declined. Care advise given per protocol. Instructed to call back if symptoms worsen, any one-sided weakness, facial droop, headache, dysphagia, speech or visual disturbances. Pt lives alone; does has LifeAlert she wears and carries her cell phone.   Reason for Disposition . [1] MODERATE dizziness (e.g., interferes with normal activities) AND [2] has been evaluated by physician for this  Answer Assessment - Initial Assessment Questions 1. DESCRIPTION: "Describe your dizziness."     "Head spinning" 2. LIGHTHEADED: "Do you feel lightheaded?" (e.g., somewhat faint, woozy, weak upon standing)     Yes, worse when leaning over 3. VERTIGO: "Do you feel like either you or the room is spinning or tilting?" (i.e. vertigo)     No  4. SEVERITY: "How bad is it?"  "Do you feel like you are going to faint?" "Can you stand and walk?"   - MILD - walking normally   - MODERATE - interferes with normal activities (e.g., work, school)    - SEVERE - unable to stand, requires support to walk, feels like passing out now.      moderate 5. ONSET:  "When did the dizziness begin?"     "3 months ago at least." 6. AGGRAVATING FACTORS: "Does anything make it worse?"  (e.g., standing, change in head position)     Leaning over, turning head at times. 7. HEART RATE: "Can you tell me your heart rate?" "How many beats in 15 seconds?"  (Note: not all patients can do this)       65 8. CAUSE: "What do you think is causing the dizziness?"     unsure 9. RECURRENT SYMPTOM: "Have you had dizziness before?" If so, ask: "When was the last time?" "What happened that time?"     no 10. OTHER SYMPTOMS: "Do you have any other symptoms?" (e.g., fever, chest pain, vomiting, diarrhea, bleeding)       no  Protocols used: DIZZINESS [facility]

## 2017-08-04 ENCOUNTER — Ambulatory Visit: Payer: Medicare Other | Admitting: Internal Medicine

## 2017-08-06 ENCOUNTER — Other Ambulatory Visit: Payer: Self-pay | Admitting: Internal Medicine

## 2017-08-10 ENCOUNTER — Encounter: Payer: Self-pay | Admitting: Internal Medicine

## 2017-08-10 ENCOUNTER — Ambulatory Visit: Payer: Medicare Other | Admitting: Internal Medicine

## 2017-08-10 VITALS — BP 128/78 | HR 72 | Temp 97.5°F | Resp 16 | Ht 64.0 in | Wt 159.4 lb

## 2017-08-10 DIAGNOSIS — R42 Dizziness and giddiness: Secondary | ICD-10-CM

## 2017-08-10 DIAGNOSIS — I10 Essential (primary) hypertension: Secondary | ICD-10-CM | POA: Diagnosis not present

## 2017-08-10 DIAGNOSIS — G44219 Episodic tension-type headache, not intractable: Secondary | ICD-10-CM | POA: Diagnosis not present

## 2017-08-10 NOTE — Progress Notes (Signed)
Subjective:    Patient ID: [name], female    DOB: 12/29/1929, 84 y.o.   MRN: [id number]  DOS:  08/10/2017 Type of visit - description : acute Interval history: The last time she was here she complained of an acute episode of dizziness that is resolved. Today, she also reports chronic, mild dizziness on and off mostly when she moves her head, turn around or leans forward. Symptoms last few seconds, she feels like she needs to "hold on" to things.   dizziness is not associated with other symptoms such as diplopia or nausea.  Also saw neurology, Topamax restarted.  Review of Systems Has several decades history of tinnitus, not getting worse. Mild HOH, reports that she does not need any hearing aids at this point.   Past Medical History:  Diagnosis Date  . Anxiety and depression   . Back pain    lumbar  . Fecal incontinence    Dr. Michail Sermon, likely due to loose stools in conjunction with pelvic muscle laxity from aging, recommended to stop Miralax  . GI (gastrointestinal bleed)    hx of colon polyps 2004, diverticulosis, internal hemmorhids  . Headache(784.0)   . Hydronephrosis    Right 2008 s/p urology eval CT/cysto: observation/monitor  . Hyperlipidemia   . Hypertension   . Knee pain    right chronic  . Migraine    on topamax , per neuro  . Osteoporosis   . Pulmonary nodules    Wert. First seen by CT only 05/04/03, no change on f/u 08/08/06 -Macroscopic changes rml 04/27/07 > resolved april 6,2010 no further w/u   . Seborrheic keratosis    Left inferior posterior flank  . SVT (supraventricular tachycardia) (HCC)    sees Dr.Ross    Past Surgical History:  Procedure Laterality Date  . ABDOMINAL HYSTERECTOMY    . APPENDECTOMY    . BASAL CELL CARCINOMA EXCISION    . BLADDER REPAIR    . CATARACT EXTRACTION  2008   Dr.Digby  . CHOLECYSTECTOMY    . lumbar reconstructive surgery  01/07/2009  . R ovary removed    . ROTATOR CUFF REPAIR  9/-2011  . SHOULDER SURGERY   12-2011   LEFT  . TONSILLECTOMY    . TOTAL KNEE ARTHROPLASTY     right  . trigger finger surgery     L thumb (2014), R ring finger 05-2013    Social History   Socioeconomic History  . Marital status: Widowed    Spouse name: Not on file  . Number of children: 1  . Years of education: Not on file  . Highest education level: Not on file  Occupational History  . Occupation: retired   Scientific laboratory technician  . Financial resource strain: Not on file  . Food insecurity:    Worry: Not on file    Inability: Not on file  . Transportation needs:    Medical: Not on file    Non-medical: Not on file  Tobacco Use  . Smoking status: Never Smoker  . Smokeless tobacco: Never Used  Substance and Sexual Activity  . Alcohol use: Yes    Comment: rarely  . Drug use: No  . Sexual activity: Never  Lifestyle  . Physical activity:    Days per week: Not on file    Minutes per session: Not on file  . Stress: Not on file  Relationships  . Social connections:    Talks on phone: Not on file    Gets together:  Not on file    Attends religious service: Not on file    Active member of club or organization: Not on file    Attends meetings of clubs or organizations: Not on file    Relationship status: Not on file  . Intimate partner violence:    Fear of current or ex partner: Not on file    Emotionally abused: Not on file    Physically abused: Not on file    Forced sexual activity: Not on file  Other Topics Concern  . Not on file  Social History Narrative   Moved from a retirement home to a condominium 04-2013, independent living,  lives by herself , drives    Son lives in [place] as of 08/10/2017      Reactions   Amitriptyline Hcl    REACTION: hallucinations   Carisoprodol    REACTION: nightmares,rash,tachycardia   Codeine    REACTION: "deathly ill" nauesa, dizzy,weak   Fosamax [alendronate Sodium] Other (See Comments)   Dental issues   Meloxicam    REACTION: sensitive to sun;  facial redness; may cause severe abd pain   Metronidazole    REACTION: abd pain   Nortriptyline Hcl    REACTION: hallucinations   Oxaprozin    REACTION: abd pain   Promethazine Hcl    REACTION: spastic arms and legs flailing while awake and unusual behavior while sleep - walking all over house during night   Rofecoxib    REACTION: rash   Sumatriptan    REACTION: rash   Tramadol Other (See Comments)   Dizziness and constipation      Medication List        Accurate as of 08/10/17  4:48 PM. Always use your most recent med list.          acetaminophen 500 MG tablet Commonly known as:  TYLENOL Take 500 mg by mouth every 6 (six) hours as needed for moderate pain.   amoxicillin-clavulanate 875-125 MG tablet Commonly known as:  AUGMENTIN Prior to dental appts   atorvastatin 40 MG tablet Commonly known as:  LIPITOR Take 1 tablet (40 mg total) by mouth at bedtime.   BLUE-EMU MAXIMUM STRENGTH 2.5 % Liqd Generic drug:  Menthol (Topical Analgesic) Apply topically as needed.   celecoxib 200 MG capsule Commonly known as:  CELEBREX Take 200 mg by mouth daily.   CRANBERRY PO Take 1 capsule by mouth daily.   cyclobenzaprine 10 MG tablet Commonly known as:  FLEXERIL Take 10 mg by mouth 3 (three) times daily as needed for muscle spasms.   diltiazem 120 MG 24 hr capsule Commonly known as:  TIAZAC Take 1 capsule (120 mg total) by mouth daily.   gabapentin 100 MG capsule Commonly known as:  NEURONTIN Take 200-300 mg by mouth 3 (three) times daily as needed (pain).   multivitamin per tablet Take 1 tablet by mouth daily.   NON FORMULARY Take 2 capsules by mouth 2 (two) times daily. Hydro Eye   PROBIOTIC DAILY PO Take 2 capsules by mouth daily.   topiramate 25 MG tablet Commonly known as:  TOPAMAX Take 25 mg by mouth daily.   trimethoprim 100 MG tablet Commonly known as:  TRIMPEX Take 100 mg by mouth daily.   VITAMIN C PO Take 1 tablet by mouth 2 (two) times  daily.   YUVAFEM 10 MCG Tabs vaginal tablet Generic drug:  Estradiol Place vaginally. Twice weekly  Objective:   Physical Exam BP 128/78 (BP Location: Left Arm, Patient Position: Sitting, Cuff Size: Small)   Pulse 72   Temp (!) 97.5 F (36.4 C) (Oral)   Resp 16   Ht 5\' 4"  (1.626 m)   Wt 159 lb 6 oz (72.3 kg)   SpO2 97%   BMI 27.36 kg/m  General:   Well developed, well nourished . NAD.  HEENT:  Normocephalic . Face symmetric, atraumatic Neck: Normal carotid pulses bilaterally, no bruit. Lungs:  CTA B Normal respiratory effort, no intercostal retractions, no accessory muscle use. Heart: RRR,  no murmur.  No pretibial edema bilaterally  Skin: Not pale. Not jaundice Neurologic:  alert & oriented X3.  Speech normal, gait appropriate for age and unassisted.  Today she did not need any help transferring to the examining table Psych--  Cognition and judgment appear intact.  Cooperative with normal attention span and concentration.  Behavior appropriate. No anxious or depressed appearing.      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 SVT, saw Dr. Harrington Challenger 2014, on cardiazem Migraines, on Topamax-gabapentin, flexeril prn sees neurology (Dr Domingo Cocking)   GI: Fecal incontinence, GI Dr. Michail Sermon H/o SBO 08-2015 Pulmonary nodules First seen by CT only 05/04/03, no change on f/u 08/08/06 -Macroscopic changes rml 04/27/07 > resolved april 6,2010 no further  w/u  Nodules noted again: CT 08/2015  CT 04-2016: Stable, recheck in one year if high risk only. [facility]  Sees Dermatology  GU: Sees urology regularly, on daily antibiotics  PLAN: Dizziness, acute: See last visit, resolved, MRI normal for age. Dizziness, chronic: See HPI, on and off dizziness as described above, likely a peripheral issue.  We discussed the risk of falls, recommend to use a cane, she agreed to be referred for vestibular rehabilitation. Aspirin: Wonders if she should continue taking aspirin in light of new data and research.  We agreed to stop aspirin for now Migraine headaches: Saw Dr. Domingo Cocking recently, was having more frequent migraines, Topamax was added to Flexeril. HTN: Ambulatory BPs reviewed, all within range.  120/64, pulse 64. RTC 4-5 months

## 2017-08-10 NOTE — Assessment & Plan Note (Signed)
Dizziness, acute: See last visit, resolved, MRI normal for age. Dizziness, chronic: See HPI, on and off dizziness as described above, likely a peripheral issue.  We discussed the risk of falls, recommend to use a cane, she agreed to be referred for vestibular rehabilitation. Aspirin: Wonders if she should continue taking aspirin in light of new data and research.  We agreed to stop aspirin for now Migraine headaches: Saw Dr. Domingo Cocking recently, was having more frequent migraines, Topamax was added to Flexeril. HTN: Ambulatory BPs reviewed, all within range.  120/64, pulse 64. RTC 4-5 months

## 2017-08-10 NOTE — Progress Notes (Signed)
Pre visit review using our clinic review tool, if applicable. No additional management support is needed unless otherwise documented below in the visit note. 

## 2017-08-10 NOTE — Patient Instructions (Signed)
   GO TO THE FRONT DESK Schedule your next appointment for a checkup in 4 to 5 months from today.

## 2017-08-19 ENCOUNTER — Telehealth: Payer: Self-pay | Admitting: *Deleted

## 2017-08-19 NOTE — Telephone Encounter (Signed)
Plan of Care signed and faxed to [facility]. Form sent for scanning.

## 2017-08-19 NOTE — Telephone Encounter (Signed)
Received Physician Orders from [facility] PT; forwarded to provider/SLS 05/03

## 2017-09-06 ENCOUNTER — Ambulatory Visit: Payer: Self-pay | Admitting: *Deleted

## 2017-09-06 NOTE — Telephone Encounter (Signed)
Author phoned pt. to review symptoms. Pt denies facial numbness/tinlging, unilateral weakness, asymmetrical smile or tongue deviation, or slurred speech, but confirms that she does have a mild HA and is unsure if it is sinus congestion related. Pt. Stated she feels like her self now, but the dizziness episode she had last night "was the worst, and it lasted for hours". Author asked pt. If she had taken her BP recently, and pt. Stated she had not, but had an automatic cuff at home and took it while over the phone, with reading of 145/80, pulse 68. Author encouraged pt. To continue monitoring her BP, and if HA worsens and/or systolic BP rises above 340 to go to Emergency room for evaluation. Author reviewed s/s of stroke with pt, and advised her to go to ED if she experiences slurred speech, confused thinking, and/or weakness or numbness on one side of the body. Author verbalized understanding, and follow-up appointment with Dr. Larose Kells made for 5/22 at 340PM per Dr. Larose Kells request. No other concerns at this time.

## 2017-09-06 NOTE — Telephone Encounter (Signed)
thx

## 2017-09-06 NOTE — Telephone Encounter (Signed)
Called in c/o having a severe episode of vertigo last night at 12:30am with nausea.   This is the worst episode I've had with the dizziness.  She has been seen by Dr. Larose Kells for the dizziness and had an MRI done because they thought she was having a TIA but the MRI was "ok" per pt.    I called the flow coordinator and let her know what was going on with the pt.  I didn't feel she needed to go to the ED being she was already being evaluated by Dr. Larose Kells for this and going to rehab.   The flow coordinator is going to check with Dr. Larose Kells and call the pt back.  I let [name] know that someone from the office will be calling her back after checking with Dr. Larose Kells.   She verbalized understanding.  Reason for Disposition . [1] MILD dizziness (e.g., vertigo; walking normally) AND [2] has been evaluated by physician for this  Answer Assessment - Initial Assessment Questions 1. DESCRIPTION: "Describe your dizziness."     The room was spinning around like a merry go round.   I could not stand up.   It started while I was sleeping.   I had an MRI for the dizziness.  I went to the rehab at [facility] for balance.   I was better until last night. 2. VERTIGO: "Do you feel like either you or the room is spinning or tilting?"      Yes.   I went to the bathroom and had to hold on and plunge myself onto the bed.  I could not move the least little bit.     Happened 12:30am last night this all happened.     I'm dizzy this morning but it's my normal dizziness.    This has been going on for several months now.   Dr. Larose Kells thought it was a stroke at first but it didn't end up being a stroke.     3. LIGHTHEADED: "Do you feel lightheaded?" (e.g., somewhat faint, woozy, weak upon standing)     This feels different that what I've had in the past.   4. SEVERITY: "How bad is it?"  "Can you walk?"   - MILD - Feels unsteady but walking normally.   - MODERATE - Feels very unsteady when walking, but not falling; interferes with normal  activities (e.g., school, work) .   - SEVERE - Unable to walk without falling (requires assistance).     I could not stand up because I was so dizzy.   I've never had the dizziness this bad.   It hit me suddenly.   5. ONSET:  "When did the dizziness begin?"     Last night at 12:30am.  I was also nauseated. 6. AGGRAVATING FACTORS: "Does anything make it worse?" (e.g., standing, change in head position)     Moving my head made it worse.   I had to keep my eyes closed because the whole room was spinning so bad.   I've not had it happen like this before. 7. CAUSE: "What do you think is causing the dizziness?"     I don't know.     8. RECURRENT SYMPTOM: "Have you had dizziness before?" If so, ask: "When was the last time?" "What happened that time?"     Yes but never this bad. 9. OTHER SYMPTOMS: "Do you have any other symptoms?" (e.g., headache, weakness, numbness, vomiting, earache)     I have a  little headache.   Sometimes I get migraines or have a headache from congestion.   I have congestion in my nose.   I've not taken anything for the congestion. 10. PREGNANCY: "Is there any chance you are pregnant?" "When was your last menstrual period?"  Not asked due to age.       *No Answer*  Protocols used: DIZZINESS - VERTIGO-A-AH

## 2017-09-07 ENCOUNTER — Encounter: Payer: Self-pay | Admitting: Internal Medicine

## 2017-09-07 ENCOUNTER — Ambulatory Visit: Payer: Medicare Other | Admitting: Internal Medicine

## 2017-09-07 VITALS — BP 124/70 | HR 71 | Temp 97.5°F | Resp 14 | Ht 64.0 in | Wt 157.1 lb

## 2017-09-07 DIAGNOSIS — R42 Dizziness and giddiness: Secondary | ICD-10-CM

## 2017-09-07 NOTE — Progress Notes (Signed)
Pre visit review using our clinic review tool, if applicable. No additional management support is needed unless otherwise documented below in the visit note. 

## 2017-09-07 NOTE — Progress Notes (Signed)
Subjective:    Patient ID: [name], female    DOB: 1929-11-30, 84 y.o.   MRN: [id number]  DOS:  09/07/2017 Type of visit - description : Acute visit Interval history: Here because 2 days ago had a worse episode of vertigo she ever had. At the time she did not have any stroke type of symptoms, she did have a mild headache, nausea. Few hours after that she went back to normal and she remains at baseline. At this point she has her mild usual dizziness and headache but nothing else. Of note, she is undergoing vestibular rehabilitation with PT . Also she quit Topamax about 3 weeks ago.  Review of Systems  Denies chest pain, difficulty breathing or palpitations Past Medical History:  Diagnosis Date  . Anxiety and depression   . Back pain    lumbar  . Fecal incontinence    Dr. Michail Sermon, likely due to loose stools in conjunction with pelvic muscle laxity from aging, recommended to stop Miralax  . GI (gastrointestinal bleed)    hx of colon polyps 2004, diverticulosis, internal hemmorhids  . Headache(784.0)   . Hydronephrosis    Right 2008 s/p urology eval CT/cysto: observation/monitor  . Hyperlipidemia   . Hypertension   . Knee pain    right chronic  . Migraine    on topamax , per neuro  . Osteoporosis   . Pulmonary nodules    Wert. First seen by CT only 05/04/03, no change on f/u 08/08/06 -Macroscopic changes rml 04/27/07 > resolved april 6,2010 no further w/u   . Seborrheic keratosis    Left inferior posterior flank  . SVT (supraventricular tachycardia) (HCC)    sees Dr.Ross    Past Surgical History:  Procedure Laterality Date  . ABDOMINAL HYSTERECTOMY    . APPENDECTOMY    . BASAL CELL CARCINOMA EXCISION    . BLADDER REPAIR    . CATARACT EXTRACTION  2008   Dr.Digby  . CHOLECYSTECTOMY    . lumbar reconstructive surgery  01/07/2009  . R ovary removed    . ROTATOR CUFF REPAIR  9/-2011  . SHOULDER SURGERY  12-2011   LEFT  . TONSILLECTOMY    . TOTAL KNEE  ARTHROPLASTY     right  . trigger finger surgery     L thumb (2014), R ring finger 05-2013    Social History   Socioeconomic History  . Marital status: Widowed    Spouse name: Not on file  . Number of children: 1  . Years of education: Not on file  . Highest education level: Not on file  Occupational History  . Occupation: retired   Scientific laboratory technician  . Financial resource strain: Not on file  . Food insecurity:    Worry: Not on file    Inability: Not on file  . Transportation needs:    Medical: Not on file    Non-medical: Not on file  Tobacco Use  . Smoking status: Never Smoker  . Smokeless tobacco: Never Used  Substance and Sexual Activity  . Alcohol use: Yes    Comment: rarely  . Drug use: No  . Sexual activity: Never  Lifestyle  . Physical activity:    Days per week: Not on file    Minutes per session: Not on file  . Stress: Not on file  Relationships  . Social connections:    Talks on phone: Not on file    Gets together: Not on file    Attends religious service:  Not on file    Active member of club or organization: Not on file    Attends meetings of clubs or organizations: Not on file    Relationship status: Not on file  . Intimate partner violence:    Fear of current or ex partner: Not on file    Emotionally abused: Not on file    Physically abused: Not on file    Forced sexual activity: Not on file  Other Topics Concern  . Not on file  Social History Narrative   Moved from a retirement home to a condominium 04-2013, independent living,  lives by herself , drives    Son lives in [place] as of 09/07/2017      Reactions   Amitriptyline Hcl    REACTION: hallucinations   Carisoprodol    REACTION: nightmares,rash,tachycardia   Codeine    REACTION: "deathly ill" nauesa, dizzy,weak   Fosamax [alendronate Sodium] Other (See Comments)   Dental issues   Meloxicam    REACTION: sensitive to sun; facial redness; may cause severe abd pain    Metronidazole    REACTION: abd pain   Nortriptyline Hcl    REACTION: hallucinations   Oxaprozin    REACTION: abd pain   Promethazine Hcl    REACTION: spastic arms and legs flailing while awake and unusual behavior while sleep - walking all over house during night   Rofecoxib    REACTION: rash   Sumatriptan    REACTION: rash   Tramadol Other (See Comments)   Dizziness and constipation      Medication List        Accurate as of 09/07/17 11:59 PM. Always use your most recent med list.          acetaminophen 500 MG tablet Commonly known as:  TYLENOL Take 500 mg by mouth every 6 (six) hours as needed for moderate pain.   amoxicillin-clavulanate 875-125 MG tablet Commonly known as:  AUGMENTIN Prior to dental appts   atorvastatin 40 MG tablet Commonly known as:  LIPITOR Take 1 tablet (40 mg total) by mouth at bedtime.   BLUE-EMU MAXIMUM STRENGTH 2.5 % Liqd Generic drug:  Menthol (Topical Analgesic) Apply topically as needed.   celecoxib 200 MG capsule Commonly known as:  CELEBREX Take 200 mg by mouth daily.   CRANBERRY PO Take 1 capsule by mouth daily.   cyclobenzaprine 10 MG tablet Commonly known as:  FLEXERIL Take 10 mg by mouth 3 (three) times daily as needed for muscle spasms.   diltiazem 120 MG 24 hr capsule Commonly known as:  TIAZAC Take 1 capsule (120 mg total) by mouth daily.   gabapentin 100 MG capsule Commonly known as:  NEURONTIN Take 200-300 mg by mouth 3 (three) times daily as needed (pain).   multivitamin per tablet Take 1 tablet by mouth daily.   NON FORMULARY Take 2 capsules by mouth 2 (two) times daily. Hydro Eye   PROBIOTIC DAILY PO Take 2 capsules by mouth daily.   topiramate 25 MG tablet Commonly known as:  TOPAMAX Take 25 mg by mouth daily.   trimethoprim 100 MG tablet Commonly known as:  TRIMPEX Take 100 mg by mouth daily.          Objective:   Physical Exam BP 124/70 (BP Location: Right Arm, Patient Position: Sitting,  Cuff Size: Small)   Pulse 71   Temp (!) 97.5 F (36.4 C) (Oral)   Resp 14   Ht 5\' 4"  (  1.626 m)   Wt 157 lb 2 oz (71.3 kg)   SpO2 97%   BMI 26.97 kg/m  General:   Well developed, well nourished . NAD.  HEENT:  Normocephalic . Face symmetric, atraumatic Lungs:  CTA B Normal respiratory effort, no intercostal retractions, no accessory muscle use. Heart: RRR,  no murmur.  Skin: Not pale. Not jaundice Neurologic:  alert & oriented X3.  Speech normal, gait assisted by a walker, seems a steady.  EOMI, motor symmetric. Psych--  Cognition and judgment appear intact.  Cooperative with normal attention span and concentration.  Behavior appropriate. No anxious or depressed appearing.      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 SVT, saw Dr. Harrington Challenger 2014, on cardiazem NEURO: Migraines: on gabapentin, flexeril prn sees neurology ;d/c topamax ~ 08/17/17 (s/e)(Dr Domingo Cocking)   Chronic Dizzies : 2012/2013: w/u (-) including echo, MRA head-neck, Brain MRI, CT head.  06/2017 brain MRI wnl GI: Fecal incontinence, GI Dr. Michail Sermon H/o SBO 08-2015 Pulmonary nodules First seen by CT only 05/04/03, no change on f/u 08/08/06 -Macroscopic changes rml 04/27/07 > resolved april 6,2010 no further w/u  Nodules noted again: CT 08/2015  CT 04-2016: Stable, recheck in one year if high risk only. [facility]  Sees Dermatology  GU: Sees urology regularly, on daily antibiotics  PLAN: Acute on chronic  dizziness: She is back to baseline, neurological exam at baseline as well.  Extensive work-up few years ago and recent MRI of the brain were normal. Recommend to continue vestibular rehab, refer to neurology, for further advice. Migraine headaches: Self DC Topamax due to side effects according to the patient.  She will see Dr. Domingo Cocking soon.

## 2017-09-07 NOTE — Patient Instructions (Signed)
Will refer you to the neurologist

## 2017-09-08 ENCOUNTER — Telehealth: Payer: Self-pay | Admitting: Internal Medicine

## 2017-09-08 NOTE — Telephone Encounter (Signed)
[name]- can we see if [facility] can see her sooner?

## 2017-09-08 NOTE — Assessment & Plan Note (Signed)
Acute on chronic dizziness: She is back to baseline, neurological exam at baseline as well.  Extensive work-up few years ago and recent MRI of the brain were normal. Recommend to continue vestibular rehab, refer to neurology, for further advice. Migraine headaches: Self DC Topamax due to side effects according to the patient.  She will see Dr. Domingo Cocking soon.

## 2017-09-08 NOTE — Telephone Encounter (Signed)
Copied from [place] [id number]. Topic: Quick Communication - See Telephone Encounter >> Sep 08, 2017 12:33 PM Ivar Drape wrote: CRM for notification. See Telephone encounter for: 09/08/17. Patient said [facility] cannot see her until 11/15/17.  She wants to know if there is somewhere else she can go.  Please advise.

## 2017-09-16 ENCOUNTER — Other Ambulatory Visit: Payer: Self-pay | Admitting: Obstetrics and Gynecology

## 2017-09-16 DIAGNOSIS — Z1231 Encounter for screening mammogram for malignant neoplasm of breast: Secondary | ICD-10-CM

## 2017-09-21 ENCOUNTER — Telehealth: Payer: Self-pay | Admitting: *Deleted

## 2017-09-21 NOTE — Telephone Encounter (Signed)
Received Physician Orders/Plan of Care from [facility] PT; forwarded to provider/SLS 06/05

## 2017-09-23 ENCOUNTER — Other Ambulatory Visit: Payer: Self-pay

## 2017-09-23 ENCOUNTER — Other Ambulatory Visit (INDEPENDENT_AMBULATORY_CARE_PROVIDER_SITE_OTHER): Payer: Medicare Other

## 2017-09-23 DIAGNOSIS — M316 Other giant cell arteritis: Secondary | ICD-10-CM

## 2017-09-23 LAB — C-REACTIVE PROTEIN: CRP: 0.2 mg/dL — AB (ref 0.5–20.0)

## 2017-09-23 LAB — SEDIMENTATION RATE: Sed Rate: 5 mm/hr (ref 0–30)

## 2017-09-29 ENCOUNTER — Ambulatory Visit: Payer: Medicare Other | Admitting: Internal Medicine

## 2017-10-05 ENCOUNTER — Telehealth: Payer: Self-pay

## 2017-10-05 NOTE — Telephone Encounter (Signed)
Please advise- looks like labs from 09/23/2017 were only forwarded to ophthalmology- not resulted to Pt- does ophthalmology need to let her know of results?

## 2017-10-05 NOTE — Telephone Encounter (Signed)
Advised patient, results were normal and forwarded to ophthalmology, further advice per her eye doctor

## 2017-10-05 NOTE — Telephone Encounter (Signed)
Spoke w/ Pt- informed of lab results and recommendations. Pt verbalized understanding.

## 2017-10-05 NOTE — Telephone Encounter (Signed)
Copied from [place] [id number]. Topic: Quick Communication - Lab Results >> Oct 03, 2017  4:42 PM Antonieta Iba C wrote: Pt called in for lab results.  CB: [phone]    >> Oct 05, 2017 11:25 AM Keene Breath wrote: Patient called again wanting results from her labs.  CB# [phone].

## 2017-10-18 ENCOUNTER — Telehealth: Payer: Self-pay | Admitting: *Deleted

## 2017-10-18 NOTE — Telephone Encounter (Signed)
Received PT End of Care for review; forwarded to provider/SLS 07/02

## 2017-11-14 ENCOUNTER — Ambulatory Visit
Admission: RE | Admit: 2017-11-14 | Discharge: 2017-11-14 | Disposition: A | Discharge status: Home / Self Care | Payer: Medicare Other | Source: Ambulatory Visit | Attending: Obstetrics and Gynecology | Admitting: Obstetrics and Gynecology

## 2017-11-14 DIAGNOSIS — Z1231 Encounter for screening mammogram for malignant neoplasm of breast: Secondary | ICD-10-CM

## 2017-11-15 ENCOUNTER — Ambulatory Visit: Payer: Medicare Other | Admitting: Neurology

## 2017-11-15 ENCOUNTER — Encounter: Payer: Self-pay | Admitting: Neurology

## 2017-11-15 ENCOUNTER — Encounter

## 2017-11-15 VITALS — BP 170/88 | HR 69 | Ht 64.0 in | Wt 159.0 lb

## 2017-11-15 DIAGNOSIS — G43709 Chronic migraine without aura, not intractable, without status migrainosus: Secondary | ICD-10-CM

## 2017-11-15 DIAGNOSIS — R413 Other amnesia: Secondary | ICD-10-CM | POA: Diagnosis not present

## 2017-11-15 DIAGNOSIS — IMO0002 Reserved for concepts with insufficient information to code with codable children: Secondary | ICD-10-CM | POA: Insufficient documentation

## 2017-11-15 MED ORDER — PROPRANOLOL HCL ER 60 MG PO CP24: 60.0000 mg | ORAL_CAPSULE | Freq: Every day | ORAL | 11 refills | Status: DC

## 2017-11-15 MED ORDER — BUTALBITAL-APAP-CAFFEINE 50-325-40 MG PO TABS: 1.0000 | ORAL_TABLET | Freq: Four times a day (QID) | ORAL | 5 refills | Status: DC | PRN

## 2017-11-15 NOTE — Progress Notes (Signed)
PATIENT: [name] DOB: [birth year]  Chief Complaint  Patient presents with  . Migraine    Right temple headaches off and on for years. Patient states she noramlly takes tylenol and once it gets worse she will take flexeril. She states the flexeril helps by putting her to sleep. She has previously taken topamax and was not helpful.  . Pcp    Dr. Kathlene November     HISTORICAL  [name] is 84 years old female, seen in request by her primary care physician Dr. Larose Kells, [facility], for evaluation of frequent headaches, evaluation was on November 15, 2017  She had a migraine headaches for many years, always on her right side, severe pounding headache with associated light noise sensitivity, she still has migraine sometimes, Tylenol provide limited help, she often take Flexeril 3 times each week which will put her into sleep, sleep usually helps her headaches, She now has headaches about couple times each week, lasting for few hours,  She has seen by Domingo Cocking for migraine headaches in the past, had a good response to trigger point injection in her skull region once, but not during her second trial,  She also memory loss over the past few months, brother was 75 years younger than she is was diagnosed with dementia, sometimes she forgot her grandchildren's name, tends to misplace things, she is a retired Systems developer  I personally reviewed MRI brain in March 2019, mild generalized atrophy more on the left perisylvian fissure, no acute abnormality.  Laboratory evaluation on September 23, 2017, normal ESR 5, C-reactive protein 0.2, CMP, glucose 119, CBC, hemoglobin of 12.4,  B12 381 in Jan 2018  REVIEW OF SYSTEMS: Full 14 system review of systems performed and notable only for memory loss, headaches, dizziness, not enough sleep, joint pain, sweating, eye pain, swelling in ankles, hearing loss, ringing the ears  ALLERGIES: Allergies  Allergen Reactions  . Amitriptyline Hcl    REACTION: hallucinations  . Carisoprodol     REACTION: nightmares,rash,tachycardia  . Codeine     REACTION: "deathly ill" nauesa, dizzy,weak  . Fosamax [Alendronate Sodium] Other (See Comments)    Dental issues  . Meloxicam     REACTION: sensitive to sun; facial redness; may cause severe abd pain  . Metronidazole     REACTION: abd pain  . Nortriptyline Hcl     REACTION: hallucinations  . Oxaprozin     REACTION: abd pain  . Promethazine Hcl     REACTION: spastic arms and legs flailing while awake and unusual behavior while sleep - walking all over house during night  . Rofecoxib     REACTION: rash  . Sumatriptan     REACTION: rash  . Tramadol Other (See Comments)    Dizziness and constipation    HOME MEDICATIONS: Current Outpatient Medications  Medication Sig Dispense Refill  . acetaminophen (TYLENOL) 500 MG tablet Take 500 mg by mouth every 6 (six) hours as needed for moderate pain.    Marland Kitchen amoxicillin-clavulanate (AUGMENTIN) 875-125 MG tablet Prior to dental appts    . atorvastatin (LIPITOR) 40 MG tablet Take 1 tablet (40 mg total) by mouth at bedtime. 90 tablet 1  . celecoxib (CELEBREX) 200 MG capsule Take 200 mg by mouth daily.    Marland Kitchen CRANBERRY PO Take 1 capsule by mouth daily.    . cyclobenzaprine (FLEXERIL) 10 MG tablet Take 10 mg by mouth 3 (three) times daily as needed for muscle spasms.    Marland Kitchen  diltiazem (TIAZAC) 120 MG 24 hr capsule Take 1 capsule (120 mg total) by mouth daily. 90 capsule 1  . gabapentin (NEURONTIN) 100 MG capsule Take 200-300 mg by mouth 3 (three) times daily as needed (pain).     . Menthol, Topical Analgesic, (BLUE-EMU MAXIMUM STRENGTH) 2.5 % LIQD Apply topically as needed.    . multivitamin (THERAGRAN) per tablet Take 1 tablet by mouth daily.      . NON FORMULARY Take 2 capsules by mouth 2 (two) times daily. [employer]	[place]    . Probiotic Product (PROBIOTIC DAILY PO) Take 2 capsules by mouth daily.     Marland Kitchen trimethoprim (TRIMPEX) 100 MG tablet Take 100 mg by mouth  daily.  11   No current facility-administered medications for this visit.     PAST MEDICAL HISTORY: Past Medical History:  Diagnosis Date  . Anxiety and depression   . Back pain    lumbar  . Fecal incontinence    Dr. Michail Sermon, likely due to loose stools in conjunction with pelvic muscle laxity from aging, recommended to stop Miralax  . GI (gastrointestinal bleed)    hx of colon polyps 2004, diverticulosis, internal hemmorhids  . Headache   . Headache(784.0)   . Hydronephrosis    Right 2008 s/p urology eval CT/cysto: observation/monitor  . Hyperlipidemia   . Hypertension   . Knee pain    right chronic  . Migraine    on topamax , per neuro  . Osteoporosis   . Pulmonary nodules    [place]. First seen by CT only 05/04/03, no change on f/u 08/08/06 -Macroscopic changes rml 04/27/07 > resolved april 6,2010 no further w/u   . Seborrheic keratosis    Left inferior posterior flank  . SVT (supraventricular tachycardia) ([place])    sees Dr.Ross    PAST SURGICAL HISTORY: Past Surgical History:  Procedure Laterality Date  . ABDOMINAL HYSTERECTOMY    . APPENDECTOMY    . BASAL CELL CARCINOMA EXCISION    . BLADDER REPAIR    . CATARACT EXTRACTION  2008   Dr.Digby  . CHOLECYSTECTOMY    . lumbar reconstructive surgery  01/07/2009  . R ovary removed    . ROTATOR CUFF REPAIR  9/-2011  . SHOULDER SURGERY  12-2011   LEFT  . TONSILLECTOMY    . TOTAL KNEE ARTHROPLASTY     right  . trigger finger surgery     L thumb (2014), R ring finger 05-2013    FAMILY HISTORY: Family History  Problem Relation Age of Onset  . Breast cancer Mother        later in life  . Diabetes Mother        late in life  . Heart disease Mother        late onset  . Lung cancer Father        apparently had TB  . Heart disease Father        late onset  . Prostate cancer Father   . Colitis Brother   . Cancer Brother   . Colon cancer Neg Hx     SOCIAL HISTORY: Social History   Socioeconomic History  . Marital  status: Widowed    Spouse name: Not on file  . Number of children: 1  . Years of education: college   . Highest education level: Doctorate  Occupational History  . Occupation: retired   Scientific laboratory technician  . Financial resource strain: Not on file  . Food insecurity:    Worry: Not  on file    Inability: Not on file  . Transportation needs:    Medical: Not on file    Non-medical: Not on file  Tobacco Use  . Smoking status: Never Smoker  . Smokeless tobacco: Never Used  Substance and Sexual Activity  . Alcohol use: Yes    Comment: rarely  . Drug use: No  . Sexual activity: Never  Lifestyle  . Physical activity:    Days per week: Not on file    Minutes per session: Not on file  . Stress: Not on file  Relationships  . Social connections:    Talks on phone: Not on file    Gets together: Not on file    Attends religious service: Not on file    Active member of club or organization: Not on file    Attends meetings of clubs or organizations: Not on file    Relationship status: Not on file  . Intimate partner violence:    Fear of current or ex partner: Not on file    Emotionally abused: Not on file    Physically abused: Not on file    Forced sexual activity: Not on file  Other Topics Concern  . Not on file  Social History Narrative   Moved from a retirement home to a condominium 04-2013, independent living,  lives by herself , drives    Son lives in [place]   Right handed    1 cup of caffeine per day      PHYSICAL EXAM   Vitals:   11/15/17 1009  BP: (!) 170/88  Pulse: 69  Weight: 159 lb (72.1 kg)  Height: 5' 4"  (1.626 m)    Not recorded      Body mass index is 27.29 kg/m.  PHYSICAL EXAMNIATION:  Gen: NAD, conversant, well nourised, obese, well groomed                     Cardiovascular: Regular rate rhythm, no peripheral edema, warm, nontender. Eyes: Conjunctivae clear without exudates or hemorrhage Neck: Supple, no carotid bruits. Pulmonary: Clear to  auscultation bilaterally   NEUROLOGICAL EXAM:  MENTAL STATUS: Speech:    Speech is normal; fluent and spontaneous with normal comprehension.  Cognition:     Orientation to time, place and person     Normal recent and remote memory     Normal Attention span and concentration     Normal Language, naming, repeating,spontaneous speech     Fund of knowledge   CRANIAL NERVES: CN II: Visual fields are full to confrontation. Fundoscopic exam is normal with sharp discs and no vascular changes. Pupils are round equal and briskly reactive to light. CN III, IV, VI: extraocular movement are normal. No ptosis. CN V: Facial sensation is intact to pinprick in all 3 divisions bilaterally. Corneal responses are intact.  CN VII: Face is symmetric with normal eye closure and smile. CN VIII: Hearing is normal to rubbing fingers CN IX, X: Palate elevates symmetrically. Phonation is normal. CN XI: Head turning and shoulder shrug are intact CN XII: Tongue is midline with normal movements and no atrophy.  MOTOR: There is no pronator drift of out-stretched arms. Muscle bulk and tone are normal. Muscle strength is normal.  REFLEXES: Reflexes are 2+ and symmetric at the biceps, triceps, knees, and ankles. Plantar responses are flexor.  SENSORY: Intact to light touch, pinprick, positional sensation and vibratory sensation are intact in fingers and toes.  COORDINATION: Rapid alternating movements and fine  finger movements are intact. There is no dysmetria on finger-to-nose and heel-knee-shin.    GAIT/STANCE: Posture is normal. Gait is steady with normal steps, base, arm swing, and turning. Romberg is absent.   DIAGNOSTIC DATA (LABS, IMAGING, TESTING) - I reviewed patient records, labs, notes, testing and imaging myself where available.   ASSESSMENT AND PLAN  [name] is a 84 y.o. female   Chronic migraine headaches Mild cognitive impairment  Family history of dementia, MRI of the brain  showed significant left perisylvian fissure atrophy  Check TSH  Start preventive medications propranolol ER 60 mg daily as preventive medications  Not a good candidate for triptan due to her age, will try Fioricet as needed, limit the use to couple times each week   Marcial Pacas, M.D. Ph.D.  [facility] [address], [place], [place] [address] Ph: [phone] Fax: [phone]  CC:  Colon Branch, MD

## 2017-11-16 ENCOUNTER — Telehealth: Payer: Self-pay | Admitting: *Deleted

## 2017-11-16 LAB — TSH: TSH: 2.28 u[IU]/mL (ref 0.450–4.500)

## 2017-11-16 NOTE — Telephone Encounter (Signed)
-----   Message from Marcial Pacas, MD sent at 11/16/2017  8:24 AM EDT ----- Please call patient for normal tsh

## 2017-11-16 NOTE — Telephone Encounter (Signed)
Spoke to patient - she is aware of her normal results.   

## 2017-12-06 ENCOUNTER — Encounter: Payer: Self-pay | Admitting: *Deleted

## 2017-12-06 ENCOUNTER — Ambulatory Visit (INDEPENDENT_AMBULATORY_CARE_PROVIDER_SITE_OTHER): Payer: Medicare Other | Admitting: *Deleted

## 2017-12-06 ENCOUNTER — Ambulatory Visit: Payer: Medicare Other | Admitting: *Deleted

## 2017-12-06 VITALS — BP 129/67 | HR 59 | Ht 64.0 in | Wt 160.2 lb

## 2017-12-06 DIAGNOSIS — Z0000 Encounter for general adult medical examination without abnormal findings: Secondary | ICD-10-CM

## 2017-12-06 DIAGNOSIS — Z780 Asymptomatic menopausal state: Secondary | ICD-10-CM

## 2017-12-06 NOTE — Patient Instructions (Addendum)
Please schedule your next medicare wellness visit with me in 1 yr.  Eat heart healthy diet (full of fruits, vegetables, whole grains, lean protein, water--limit salt, fat, and sugar intake) and increase physical activity as tolerated.  Continue doing brain stimulating activities (puzzles, reading, adult coloring books, staying active) to keep memory sharp.   I have ordered your bone density scan as requested. Please schedule.   [name] , Thank you for taking time to come for your Medicare Wellness Visit. I appreciate your ongoing commitment to your health goals. Please review the following plan we discussed and let me know if I can assist you in the future.   These are the goals we discussed: Goals    . DIET - EAT MORE FRUITS AND VEGETABLES       This is a list of the screening recommended for you and due dates:  Health Maintenance  Topic Date Due  . Flu Shot  01/17/2018*  . Tetanus Vaccine  12/21/2022  . DEXA scan (bone density measurement)  Completed  . Pneumonia vaccines  Completed  *Topic was postponed. The date shown is not the original due date.    Health Maintenance for Postmenopausal Women Menopause is a normal process in which your reproductive ability comes to an end. This process happens gradually over a span of months to years, usually between the ages of 47 and 50. Menopause is complete when you have missed 12 consecutive menstrual periods. It is important to talk with your health care provider about some of the most common conditions that affect postmenopausal women, such as heart disease, cancer, and bone loss (osteoporosis). Adopting a healthy lifestyle and getting preventive care can help to promote your health and wellness. Those actions can also lower your chances of developing some of these common conditions. What should I know about menopause? During menopause, you may experience a number of symptoms, such as:  Moderate-to-severe hot flashes.  Night  sweats.  Decrease in sex drive.  Mood swings.  Headaches.  Tiredness.  Irritability.  Memory problems.  Insomnia.  Choosing to treat or not to treat menopausal changes is an individual decision that you make with your health care provider. What should I know about hormone replacement therapy and supplements? Hormone therapy products are effective for treating symptoms that are associated with menopause, such as hot flashes and night sweats. Hormone replacement carries certain risks, especially as you become older. If you are thinking about using estrogen or estrogen with progestin treatments, discuss the benefits and risks with your health care provider. What should I know about heart disease and stroke? Heart disease, heart attack, and stroke become more likely as you age. This may be due, in part, to the hormonal changes that your body experiences during menopause. These can affect how your body processes dietary fats, triglycerides, and cholesterol. Heart attack and stroke are both medical emergencies. There are many things that you can do to help prevent heart disease and stroke:  Have your blood pressure checked at least every 1-2 years. High blood pressure causes heart disease and increases the risk of stroke.  If you are 35-45 years old, ask your health care provider if you should take aspirin to prevent a heart attack or a stroke.  Do not use any tobacco products, including cigarettes, chewing tobacco, or electronic cigarettes. If you need help quitting, ask your health care provider.  It is important to eat a healthy diet and maintain a healthy weight. ? Be sure to  include plenty of vegetables, fruits, low-fat dairy products, and lean protein. ? Avoid eating foods that are high in solid fats, added sugars, or salt (sodium).  Get regular exercise. This is one of the most important things that you can do for your health. ? Try to exercise for at least 150 minutes each week.  The type of exercise that you do should increase your heart rate and make you sweat. This is known as moderate-intensity exercise. ? Try to do strengthening exercises at least twice each week. Do these in addition to the moderate-intensity exercise.  Know your numbers.Ask your health care provider to check your cholesterol and your blood glucose. Continue to have your blood tested as directed by your health care provider.  What should I know about cancer screening? There are several types of cancer. Take the following steps to reduce your risk and to catch any cancer development as early as possible. Breast Cancer  Practice breast self-awareness. ? This means understanding how your breasts normally appear and feel. ? It also means doing regular breast self-exams. Let your health care provider know about any changes, no matter how small.  If you are 73 or older, have a clinician do a breast exam (clinical breast exam or CBE) every year. Depending on your age, family history, and medical history, it may be recommended that you also have a yearly breast X-ray (mammogram).  If you have a family history of breast cancer, talk with your health care provider about genetic screening.  If you are at high risk for breast cancer, talk with your health care provider about having an MRI and a mammogram every year.  Breast cancer (BRCA) gene test is recommended for women who have family members with BRCA-related cancers. Results of the assessment will determine the need for genetic counseling and BRCA1 and for BRCA2 testing. BRCA-related cancers include these types: ? Breast. This occurs in males or females. ? Ovarian. ? Tubal. This may also be called fallopian tube cancer. ? Cancer of the abdominal or pelvic lining (peritoneal cancer). ? Prostate. ? Pancreatic.  Cervical, Uterine, and Ovarian Cancer Your health care provider may recommend that you be screened regularly for cancer of the pelvic  organs. These include your ovaries, uterus, and vagina. This screening involves a pelvic exam, which includes checking for microscopic changes to the surface of your cervix (Pap test).  For women ages 21-65, health care providers may recommend a pelvic exam and a Pap test every three years. For women ages 74-65, they may recommend the Pap test and pelvic exam, combined with testing for human papilloma virus (HPV), every five years. Some types of HPV increase your risk of cervical cancer. Testing for HPV may also be done on women of any age who have unclear Pap test results.  Other health care providers may not recommend any screening for nonpregnant women who are considered low risk for pelvic cancer and have no symptoms. Ask your health care provider if a screening pelvic exam is right for you.  If you have had past treatment for cervical cancer or a condition that could lead to cancer, you need Pap tests and screening for cancer for at least 20 years after your treatment. If Pap tests have been discontinued for you, your risk factors (such as having a new sexual partner) need to be reassessed to determine if you should start having screenings again. Some women have medical problems that increase the chance of getting cervical cancer. In these cases,  your health care provider may recommend that you have screening and Pap tests more often.  If you have a family history of uterine cancer or ovarian cancer, talk with your health care provider about genetic screening.  If you have vaginal bleeding after reaching menopause, tell your health care provider.  There are currently no reliable tests available to screen for ovarian cancer.  Lung Cancer Lung cancer screening is recommended for adults 55-80 years old who are at high risk for lung cancer because of a history of smoking. A yearly low-dose CT scan of the lungs is recommended if you:  Currently smoke.  Have a history of at least 30 pack-years of  smoking and you currently smoke or have quit within the past 15 years. A pack-year is smoking an average of one pack of cigarettes per day for one year.  Yearly screening should:  Continue until it has been 15 years since you quit.  Stop if you develop a health problem that would prevent you from having lung cancer treatment.  Colorectal Cancer  This type of cancer can be detected and can often be prevented.  Routine colorectal cancer screening usually begins at age 50 and continues through age 75.  If you have risk factors for colon cancer, your health care provider may recommend that you be screened at an earlier age.  If you have a family history of colorectal cancer, talk with your health care provider about genetic screening.  Your health care provider may also recommend using home test kits to check for hidden blood in your stool.  A small camera at the end of a tube can be used to examine your colon directly (sigmoidoscopy or colonoscopy). This is done to check for the earliest forms of colorectal cancer.  Direct examination of the colon should be repeated every 5-10 years until age 75. However, if early forms of precancerous polyps or small growths are found or if you have a family history or genetic risk for colorectal cancer, you may need to be screened more often.  Skin Cancer  Check your skin from head to toe regularly.  Monitor any moles. Be sure to tell your health care provider: ? About any new moles or changes in moles, especially if there is a change in a mole's shape or color. ? If you have a mole that is larger than the size of a pencil eraser.  If any of your family members has a history of skin cancer, especially at a young age, talk with your health care provider about genetic screening.  Always use sunscreen. Apply sunscreen liberally and repeatedly throughout the day.  Whenever you are outside, protect yourself by wearing long sleeves, pants, a wide-brimmed  hat, and sunglasses.  What should I know about osteoporosis? Osteoporosis is a condition in which bone destruction happens more quickly than new bone creation. After menopause, you may be at an increased risk for osteoporosis. To help prevent osteoporosis or the bone fractures that can happen because of osteoporosis, the following is recommended:  If you are 19-50 years old, get at least 1,000 mg of calcium and at least 600 mg of vitamin D per day.  If you are older than age 50 but younger than age 70, get at least 1,200 mg of calcium and at least 600 mg of vitamin D per day.  If you are older than age 70, get at least 1,200 mg of calcium and at least 800 mg of vitamin D per day.    Smoking and excessive alcohol intake increase the risk of osteoporosis. Eat foods that are rich in calcium and vitamin D, and do weight-bearing exercises several times each week as directed by your health care provider. What should I know about how menopause affects my mental health? Depression may occur at any age, but it is more common as you become older. Common symptoms of depression include:  Low or sad mood.  Changes in sleep patterns.  Changes in appetite or eating patterns.  Feeling an overall lack of motivation or enjoyment of activities that you previously enjoyed.  Frequent crying spells.  Talk with your health care provider if you think that you are experiencing depression. What should I know about immunizations? It is important that you get and maintain your immunizations. These include:  Tetanus, diphtheria, and pertussis (Tdap) booster vaccine.  Influenza every year before the flu season begins.  Pneumonia vaccine.  Shingles vaccine.  Your health care provider may also recommend other immunizations. This information is not intended to replace advice given to you by your health care provider. Make sure you discuss any questions you have with your health care provider. Document Released:  05/28/2005 Document Revised: 10/24/2015 Document Reviewed: 01/07/2015 [employer]vier Interactive Patient Education  2018 [employer]  

## 2017-12-06 NOTE — Progress Notes (Addendum)
Subjective:   [name] is a 84 y.o. female who presents for Medicare Annual/Subsequent preventive examination.  Review of Systems: No ROS.  Medicare Wellness Visit. Additional risk factors are reflected in the social history.  Cardiac Risk Factors include: advanced age (>5men, >31 women);dyslipidemia Sleep patterns: no issues Home Safety/Smoke Alarms: Feels safe in home. Smoke alarms in place.  Living environment: Lives alone in [place]. 3rd floor. Uses elevator. Designer, multimedia. Walk-in shower. Grab rails.   Female:         Mammo-  utd     Dexa scan- ordered      Objective:    Vitals: There were no vitals taken for this visit.  There is no height or weight on file to calculate BMI.  Advanced Directives 12/06/2017 06/03/2016 09/10/2015  Does Patient Have a Medical Advance Directive? Yes Yes Yes  Type of Paramedic of [place];Living will [place];Living will Living will;Healthcare Power of Attorney  Does patient want to make changes to medical advance directive? - - No - Patient declined  Copy of [place] in Chart? No - copy requested No - copy requested No - copy requested  Would patient like information on creating a medical advance directive? - - No - patient declined information    Tobacco Social History   Tobacco Use  Smoking Status Never Smoker  Smokeless Tobacco Never Used     Counseling given: Not Answered   Clinical Intake:     Pain : No/denies pain     Past Medical History:  Diagnosis Date  . Anxiety and depression   . Back pain    lumbar  . Fecal incontinence    Dr. Michail Sermon, likely due to loose stools in conjunction with pelvic muscle laxity from aging, recommended to stop Miralax  . GI (gastrointestinal bleed)    hx of colon polyps 2004, diverticulosis, internal hemmorhids  . Headache   . Headache(784.0)   . Hydronephrosis    Right 2008 s/p urology eval CT/cysto:  observation/monitor  . Hyperlipidemia   . Hypertension   . Knee pain    right chronic  . Migraine    on topamax , per neuro  . Osteoporosis   . Pulmonary nodules    Wert. First seen by CT only 05/04/03, no change on f/u 08/08/06 -Macroscopic changes rml 04/27/07 > resolved april 6,2010 no further w/u   . Seborrheic keratosis    Left inferior posterior flank  . SVT (supraventricular tachycardia) (HCC)    sees Dr.Ross   Past Surgical History:  Procedure Laterality Date  . ABDOMINAL HYSTERECTOMY    . APPENDECTOMY    . BASAL CELL CARCINOMA EXCISION    . BLADDER REPAIR    . CATARACT EXTRACTION  2008   Dr.Digby  . CHOLECYSTECTOMY    . lumbar reconstructive surgery  01/07/2009  . R ovary removed    . ROTATOR CUFF REPAIR  9/-2011  . SHOULDER SURGERY  12-2011   LEFT  . TONSILLECTOMY    . TOTAL KNEE ARTHROPLASTY     right  . trigger finger surgery     L thumb (2014), R ring finger 05-2013   Family History  Problem Relation Age of Onset  . Breast cancer Mother        later in life  . Diabetes Mother        late in life  . Heart disease Mother        late onset  .  Lung cancer Father        apparently had TB  . Heart disease Father        late onset  . Prostate cancer Father   . Colitis Brother   . Cancer Brother   . Colon cancer Neg Hx    Social History   Socioeconomic History  . Marital status: Widowed    Spouse name: Not on file  . Number of children: 1  . Years of education: college   . Highest education level: Doctorate  Occupational History  . Occupation: retired   Scientific laboratory technician  . Financial resource strain: Not on file  . Food insecurity:    Worry: Not on file    Inability: Not on file  . Transportation needs:    Medical: Not on file    Non-medical: Not on file  Tobacco Use  . Smoking status: Never Smoker  . Smokeless tobacco: Never Used  Substance and Sexual Activity  . Alcohol use: Yes    Comment: rarely  . Drug use: No  . Sexual activity: Never    Lifestyle  . Physical activity:    Days per week: Not on file    Minutes per session: Not on file  . Stress: Not on file  Relationships  . Social connections:    Talks on phone: Not on file    Gets together: Not on file    Attends religious service: Not on file    Active member of club or organization: Not on file    Attends meetings of clubs or organizations: Not on file    Relationship status: Not on file  Other Topics Concern  . Not on file  Social History Narrative   Moved from a retirement home to a condominium 04-2013, independent living,  lives by herself , drives    Son lives in [place]   Right handed    1 cup of caffeine per day     Outpatient Encounter Medications as of 12/06/2017  Medication Sig  . acetaminophen (TYLENOL) 500 MG tablet Take 500 mg by mouth every 6 (six) hours as needed for moderate pain.  Marland Kitchen amoxicillin-clavulanate (AUGMENTIN) 875-125 MG tablet Prior to dental appts  . atorvastatin (LIPITOR) 40 MG tablet Take 1 tablet (40 mg total) by mouth at bedtime.  . butalbital-acetaminophen-caffeine (FIORICET, ESGIC) 50-325-40 MG tablet Take 1 tablet by mouth every 6 (six) hours as needed for headache. Do not refill in less than one month  . celecoxib (CELEBREX) 200 MG capsule Take 200 mg by mouth daily.  Marland Kitchen CRANBERRY PO Take 1 capsule by mouth daily.  Marland Kitchen diltiazem (TIAZAC) 120 MG 24 hr capsule Take 1 capsule (120 mg total) by mouth daily.  Marland Kitchen gabapentin (NEURONTIN) 100 MG capsule Take 200-300 mg by mouth 3 (three) times daily as needed (pain).   . Menthol, Topical Analgesic, (BLUE-EMU MAXIMUM STRENGTH) 2.5 % LIQD Apply topically as needed.  . multivitamin (THERAGRAN) per tablet Take 1 tablet by mouth daily.    . NON FORMULARY Take 2 capsules by mouth 2 (two) times daily. [employer]	[place]  . Probiotic Product (PROBIOTIC DAILY PO) Take 2 capsules by mouth daily.   . propranolol ER (INDERAL LA) 60 MG 24 hr capsule Take 1 capsule (60 mg total) by mouth daily.  Marland Kitchen trimethoprim  (TRIMPEX) 100 MG tablet Take 100 mg by mouth daily.  . cyclobenzaprine (FLEXERIL) 10 MG tablet Take 10 mg by mouth 3 (three) times daily as needed for muscle spasms.  No facility-administered encounter medications on file as of 12/06/2017.     Activities of Daily Living In your present state of health, do you have any difficulty performing the following activities: 12/06/2017 07/08/2017  Hearing? N N  Vision? N N  Difficulty concentrating or making decisions? N N  Walking or climbing stairs? Y N  Dressing or bathing? N N  Doing errands, shopping? N N  Preparing Food and eating ? N -  Using the Toilet? N -  In the past six months, have you accidently leaked urine? Y -  Do you have problems with loss of bowel control? N -  Managing your Medications? N -  Managing your Finances? N -  Housekeeping or managing your Housekeeping? N -  Some recent data might be hidden    Patient Care Team: Colon Branch, MD as PCP - General Wilford Corner, MD as Consulting Physician (Gastroenterology) Fleet Contras, MD as Consulting Physician (Nephrology) Erline Levine, MD as Consulting Physician (Neurosurgery) Netta Cedars, MD as Consulting Physician (Orthopedic Surgery) Orie Rout, MD as Referring Physician (Neurology) Gaynelle Arabian, MD as Consulting Physician (Orthopedic Surgery) Roseanne Kaufman, MD as Consulting Physician (Orthopedic Surgery) Calvert Cantor, MD as Consulting Physician (Ophthalmology) Dian Queen, MD as Consulting Physician (Obstetrics and Gynecology) Danella Sensing, MD as Consulting Physician (Dermatology)   Assessment:   This is a routine wellness examination for [name].  Exercise Activities and Dietary recommendations Current Exercise Habits: The patient does not participate in regular exercise at present Diet (meal preparation, eat out, water intake, caffeinated beverages, dairy products, fruits and vegetables): well balanced    Goals    . Lose 10 pounds      Lose weight by eating healthier.        Fall Risk Fall Risk  12/06/2017 07/08/2017 06/03/2016 05/10/2016 01/05/2016  Falls in the past year? No No No No No    Depression Screen PHQ 2/9 Scores 12/06/2017 07/08/2017 06/03/2016 05/10/2016  PHQ - 2 Score 0 0 0 0  Exception Documentation - - - -    Cognitive Function MMSE - Mini Mental State Exam 12/06/2017 06/03/2016  Orientation to time 5 5  Orientation to Place 5 5  Registration 3 3  Attention/ Calculation 5 5  Recall 3 3  Language- name 2 objects 2 2  Language- repeat 1 1  Language- follow 3 step command 3 3  Language- read & follow direction 1 1  Write a sentence 1 1  Copy design 1 1  Total score 30 30        Immunization History  Administered Date(s) Administered  . Influenza Split 12/23/2013, 01/05/2017  . Influenza Whole 03/19/2006, 02/03/2007, 04/29/2008, 12/21/2008  . Influenza, High Dose Seasonal PF 12/18/2012  . Influenza-Unspecified 12/18/2013, 12/19/2014, 01/02/2016  . Pneumococcal Conjugate-13 04/27/2014, 04/17/2015  . Pneumococcal Polysaccharide-23 01/18/2003, 01/18/2008  . Td 01/18/2003  . Tdap 12/20/2012  . Zoster 02/17/2006     Screening Tests Health Maintenance  Topic Date Due  . INFLUENZA VACCINE  01/17/2018 (Originally 11/17/2017)  . TETANUS/TDAP  12/21/2022  . DEXA SCAN  Completed  . PNA vac Low Risk Adult  Completed       Plan:    Please schedule your next medicare wellness visit with me in 1 yr.  Eat heart healthy diet (full of fruits, vegetables, whole grains, lean protein, water--limit salt, fat, and sugar intake) and increase physical activity as tolerated.  Continue doing brain stimulating activities (puzzles, reading, adult coloring books, staying active) to keep memory sharp.  I have ordered your bone density scan as requested. Please schedule.  I have personally reviewed and noted the following in the patient's chart:   . Medical and social history . Use of alcohol, tobacco or  illicit drugs  . Current medications and supplements . Functional ability and status . Nutritional status . Physical activity . Advanced directives . List of other physicians . Hospitalizations, surgeries, and ER visits in previous 12 months . Vitals . Screenings to include cognitive, depression, and falls . Referrals and appointments  In addition, I have reviewed and discussed with patient certain preventive protocols, quality metrics, and best practice recommendations. A written personalized care plan for preventive services as well as general preventive health recommendations were provided to patient.     Naaman Plummer [place], [place]  12/06/2017  Kathlene November, MD

## 2017-12-08 ENCOUNTER — Ambulatory Visit (HOSPITAL_BASED_OUTPATIENT_CLINIC_OR_DEPARTMENT_OTHER)
Admission: RE | Admit: 2017-12-08 | Discharge: 2017-12-08 | Disposition: A | Discharge status: Home / Self Care | Payer: Medicare Other | Source: Ambulatory Visit | Attending: Internal Medicine | Admitting: Internal Medicine

## 2017-12-08 DIAGNOSIS — M810 Age-related osteoporosis without current pathological fracture: Secondary | ICD-10-CM | POA: Diagnosis not present

## 2017-12-08 DIAGNOSIS — Z780 Asymptomatic menopausal state: Secondary | ICD-10-CM | POA: Diagnosis present

## 2017-12-12 ENCOUNTER — Telehealth: Payer: Self-pay

## 2017-12-12 NOTE — Telephone Encounter (Signed)
Author phoned pt. to review Dr. Ethel Rana recommendation to start prolia. Pt. Stated "Ive talked about this medication before, and I will not take it because of the side effects of destroying the bones in my hips and jaw". Pt. Is open to discussing alternative options. OV already scheduled for 01/13/18, and pt. Did not want to make another appointment in the meantime. Routed to Dr. Larose Kells as Juluis Rainier.

## 2017-12-12 NOTE — Telephone Encounter (Signed)
Noted  

## 2017-12-12 NOTE — Telephone Encounter (Signed)
-----   Message from Colon Branch, MD sent at 12/10/2017  4:37 PM EDT ----- T score -3.2. Advised patient, osteoporosis is about the same, recommend to start Prolia injections.  Please arrange if patient willing to proceed otherwise arrange a OV to discuss

## 2017-12-15 ENCOUNTER — Other Ambulatory Visit: Payer: Self-pay | Admitting: Internal Medicine

## 2017-12-23 ENCOUNTER — Other Ambulatory Visit: Payer: Self-pay | Admitting: Internal Medicine

## 2017-12-30 ENCOUNTER — Other Ambulatory Visit (HOSPITAL_COMMUNITY): Payer: Self-pay | Admitting: Orthopedic Surgery

## 2017-12-30 DIAGNOSIS — Z96651 Presence of right artificial knee joint: Secondary | ICD-10-CM

## 2018-01-03 ENCOUNTER — Telehealth: Payer: Self-pay | Admitting: Neurology

## 2018-01-03 NOTE — Telephone Encounter (Signed)
Pt called stating that [facility] has told her she is out of refills for butalbital-acetaminophen-caffeine (FIORICET, ESGIC) 50-325-40 MG tablet. Pt stating she has no more tablets left after today. Please advise

## 2018-01-03 NOTE — Telephone Encounter (Signed)
Rn call pts pharmacy. Rn stated pt was told she is out of refills. RN stated pt was given 5 refills in July 2019. [facility] staff stated the insurance will not allow pt to refill till 01/09/2018. Pt has three more refills.

## 2018-01-03 NOTE — Telephone Encounter (Addendum)
Rn call patient that per pharmacy she cannot get the floricet refill till 01/09/2018. Rn stated she was only given 10 pills per Dr. Krista Blue. Rn stated the insurance allows it to be fill on the 01/09/2018. RN also stated she has 3 refills left from the rx that was written in 10/2017. RN stated it cannot be refill in less than a month time frame. PT verbalized understanding.

## 2018-01-03 NOTE — Telephone Encounter (Signed)
Revised. 

## 2018-01-03 NOTE — Telephone Encounter (Signed)
Fioricet order in July 2019 has 5 more refills.

## 2018-01-04 ENCOUNTER — Telehealth: Payer: Self-pay | Admitting: Internal Medicine

## 2018-01-04 NOTE — Telephone Encounter (Signed)
Please advise 

## 2018-01-04 NOTE — Telephone Encounter (Signed)
Ok RF  gabapentin. Decline celebrex.  We can talk about it when she comes for her next appointment

## 2018-01-04 NOTE — Telephone Encounter (Signed)
Spoke w/ Pt- informed that looks like PCP already prescribes gabapentin- Pt informed for some reason pharmacy was trying to send to Dr. Melven Sartorius office. Informed that PCP would discuss Celebrex at her visit on 9/27- Pt verbalized understanding.

## 2018-01-04 NOTE — Telephone Encounter (Signed)
Copied from [place] [id number]. Topic: Quick Communication - See Telephone Encounter >> Jan 04, 2018  2:21 PM Rutherford Nail, [place] wrote: CRM for notification. See Telephone encounter for: 01/04/18. Patient calling and is wanting to know if Dr Larose Kells could take over filling her gabapentin (NEURONTIN) 100 MG capsule and celecoxib (CELEBREX) 200 MG capsule?  CB#: [phone]

## 2018-01-10 ENCOUNTER — Encounter (HOSPITAL_COMMUNITY): Payer: Medicare Other

## 2018-01-13 ENCOUNTER — Encounter: Payer: Self-pay | Admitting: Internal Medicine

## 2018-01-13 ENCOUNTER — Ambulatory Visit: Payer: Medicare Other | Admitting: Internal Medicine

## 2018-01-13 VITALS — BP 116/64 | HR 58 | Temp 97.9°F | Resp 16 | Ht 64.0 in | Wt 159.2 lb

## 2018-01-13 DIAGNOSIS — I10 Essential (primary) hypertension: Secondary | ICD-10-CM

## 2018-01-13 DIAGNOSIS — M810 Age-related osteoporosis without current pathological fracture: Secondary | ICD-10-CM | POA: Diagnosis not present

## 2018-01-13 DIAGNOSIS — E785 Hyperlipidemia, unspecified: Secondary | ICD-10-CM | POA: Diagnosis not present

## 2018-01-13 DIAGNOSIS — R42 Dizziness and giddiness: Secondary | ICD-10-CM | POA: Diagnosis not present

## 2018-01-13 LAB — LIPID PANEL
CHOL/HDL RATIO: 2
Cholesterol: 136 mg/dL (ref 0–200)
HDL: 54.8 mg/dL (ref >39.00)
LDL CALC: 53 mg/dL (ref 0–99)
NONHDL: 81.31
Triglycerides: 141 mg/dL (ref 0.0–149.0)
VLDL: 28.2 mg/dL (ref 0.0–40.0)

## 2018-01-13 NOTE — Progress Notes (Signed)
Subjective:    Patient ID: [name], female    DOB: 1929/11/28, 84 y.o.   MRN: [id number]  DOS:  01/13/2018 Type of visit - description : f/u Interval history: Dizziness: Is doing well but would like to do more vestibular rehabilitation Hemorrhoids: Has occasional symptoms, mostly mild to moderate pain and itching.  No bleeding. Continue with MSK issues. Osteoporosis: Went over  her bone density results High cholesterol, good med compliance  Review of Systems   Past Medical History:  Diagnosis Date  . Anxiety and depression   . Back pain    lumbar  . Fecal incontinence    Dr. Michail Sermon, likely due to loose stools in conjunction with pelvic muscle laxity from aging, recommended to stop Miralax  . GI (gastrointestinal bleed)    hx of colon polyps 2004, diverticulosis, internal hemmorhids  . Headache   . Headache(784.0)   . Hydronephrosis    Right 2008 s/p urology eval CT/cysto: observation/monitor  . Hyperlipidemia   . Hypertension   . Knee pain    right chronic  . Migraine    on topamax , per neuro  . Osteoporosis   . Pulmonary nodules    Wert. First seen by CT only 05/04/03, no change on f/u 08/08/06 -Macroscopic changes rml 04/27/07 > resolved april 6,2010 no further w/u   . Seborrheic keratosis    Left inferior posterior flank  . SVT (supraventricular tachycardia) (HCC)    sees Dr.Ross    Past Surgical History:  Procedure Laterality Date  . ABDOMINAL HYSTERECTOMY    . APPENDECTOMY    . BASAL CELL CARCINOMA EXCISION    . BLADDER REPAIR    . CATARACT EXTRACTION  2008   Dr.Digby  . CHOLECYSTECTOMY    . lumbar reconstructive surgery  01/07/2009  . R ovary removed    . ROTATOR CUFF REPAIR  9/-2011  . SHOULDER SURGERY  12-2011   LEFT  . TONSILLECTOMY    . TOTAL KNEE ARTHROPLASTY     right  . trigger finger surgery     L thumb (2014), R ring finger 05-2013    Social History   Socioeconomic History  . Marital status: Widowed    Spouse name: Not on  file  . Number of children: 1  . Years of education: college   . Highest education level: Doctorate  Occupational History  . Occupation: retired   Scientific laboratory technician  . Financial resource strain: Not on file  . Food insecurity:    Worry: Not on file    Inability: Not on file  . Transportation needs:    Medical: Not on file    Non-medical: Not on file  Tobacco Use  . Smoking status: Never Smoker  . Smokeless tobacco: Never Used  Substance and Sexual Activity  . Alcohol use: Yes    Comment: rarely  . Drug use: No  . Sexual activity: Not Currently  Lifestyle  . Physical activity:    Days per week: Not on file    Minutes per session: Not on file  . Stress: Not on file  Relationships  . Social connections:    Talks on phone: Not on file    Gets together: Not on file    Attends religious service: Not on file    Active member of club or organization: Not on file    Attends meetings of clubs or organizations: Not on file    Relationship status: Not on file  . Intimate partner violence:  Fear of current or ex partner: Not on file    Emotionally abused: Not on file    Physically abused: Not on file    Forced sexual activity: Not on file  Other Topics Concern  . Not on file  Social History Narrative   Moved from a retirement home to a condominium 04-2013, independent living,  lives by herself , drives    Son lives in [place]   Right handed    1 cup of caffeine per day       Allergies as of 01/13/2018      Reactions   Amitriptyline Hcl    REACTION: hallucinations   Carisoprodol    REACTION: nightmares,rash,tachycardia   Codeine    REACTION: "deathly ill" nauesa, dizzy,weak   Fosamax [alendronate Sodium] Other (See Comments)   Dental issues   Meloxicam    REACTION: sensitive to sun; facial redness; may cause severe abd pain   Metronidazole    REACTION: abd pain   Nortriptyline Hcl    REACTION: hallucinations   Oxaprozin    REACTION: abd pain   Promethazine Hcl     REACTION: spastic arms and legs flailing while awake and unusual behavior while sleep - walking all over house during night   Rofecoxib    REACTION: rash   Sumatriptan    REACTION: rash   Tramadol Other (See Comments)   Dizziness and constipation      Medication List        Accurate as of 01/13/18 11:59 PM. Always use your most recent med list.          acetaminophen 500 MG tablet Commonly known as:  TYLENOL Take 500 mg by mouth every 6 (six) hours as needed for moderate pain.   amoxicillin-clavulanate 875-125 MG tablet Commonly known as:  AUGMENTIN Prior to dental appts   atorvastatin 40 MG tablet Commonly known as:  LIPITOR Take 1 tablet (40 mg total) by mouth at bedtime.   BLUE-EMU MAXIMUM STRENGTH 2.5 % Liqd Generic drug:  Menthol (Topical Analgesic) Apply topically as needed.   butalbital-acetaminophen-caffeine 50-325-40 MG tablet Commonly known as:  FIORICET, ESGIC Take 1 tablet by mouth every 6 (six) hours as needed for headache. Do not refill in less than one month   celecoxib 200 MG capsule Commonly known as:  CELEBREX Take 200 mg by mouth daily.   CRANBERRY PO Take 1 capsule by mouth daily.   diltiazem 120 MG 24 hr capsule Commonly known as:  TIAZAC Take 1 capsule (120 mg total) by mouth daily.   gabapentin 100 MG capsule Commonly known as:  NEURONTIN Take 2-3 capsules (200-300 mg total) by mouth 3 (three) times daily.   multivitamin per tablet Take 1 tablet by mouth daily.   NON FORMULARY Take 2 capsules by mouth 2 (two) times daily. [employer]   PROBIOTIC DAILY PO Take 2 capsules by mouth daily.   propranolol ER 60 MG 24 hr capsule Commonly known as:  INDERAL LA Take 1 capsule (60 mg total) by mouth daily.   trimethoprim 100 MG tablet Commonly known as:  TRIMPEX Take 100 mg by mouth daily.          Objective:   Physical Exam BP 116/64 (BP Location: Right Arm, Patient Position: Sitting, Cuff Size: Small)   Pulse (!) 58   Temp 97.9  F (36.6 C) (Oral)   Resp 16   Ht 5\' 4"  (1.626 m)   Wt 159 lb 4 oz (72.2 kg)   SpO2  97%   BMI 27.34 kg/m  General:   Well developed, NAD, see BMI.  HEENT:  Normocephalic . Face symmetric, atraumatic Lungs:  CTA B Normal respiratory effort, no intercostal retractions, no accessory muscle use. Heart: RRR,  no murmur.  No pretibial edema bilaterally  Skin: Not pale. Not jaundice Neurologic:  alert & oriented X3.  Speech normal, gait appropriate, assisted by a cane Psych--  Cognition and judgment appear intact.  Cooperative with normal attention span and concentration.  Behavior appropriate. No anxious or depressed appearing.      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 SVT, saw Dr. Harrington Challenger 2014, on cardiazem NEURO: Migraines: on gabapentin, flexeril prn sees neurology ;d/c topamax ~ 08/17/17 (s/e)(Dr Domingo Cocking)   Chronic Dizzies : 2012/2013: w/u (-) including echo, MRA head-neck, Brain MRI, CT head.  06/2017 brain MRI wnl for age  Mild cognitive impairment: Per neurology note 10/2017. GI: Fecal incontinence, GI Dr. Michail Sermon H/o SBO 08-2015 Pulmonary nodules First seen by CT only 05/04/03, no change on f/u 08/08/06 -Macroscopic changes rml 04/27/07 > resolved april 6,2010 no further w/u  Nodules noted again: CT 08/2015  CT 04-2016: Stable, recheck in one year if high risk only. [facility]  Sees Dermatology  GU: Sees urology regularly, on daily antibiotics  PLAN: HTN:  Currently on diltiazem; recently inderal was added for migraines, BP seems to be very good. Chronic migraines: Saw neurology, they recommended propranolol as preventative; cont w/ Fioricet.  Not a good candidate for triptan's due to age. Mild cognitive impairment: Per neurology note. Chronic dizziness: She felt a great deal of benefit with vestibular rehabilitation, finished 2 months ago, would like to go back, referral sent. DJD: Still having MSK issues, sees Dr. Gershon Mussel, podiatrist, got local injection of the ankles. Osteoporosis: T score -3.2 on 11-2017, risk of fracture major problems discussed, she is strongly declined to take any medications, is under the impression the Prolia will affect her kidney. Social: Still lives by herself, drive, takes care of all her affairs RTC 6 months

## 2018-01-13 NOTE — Progress Notes (Signed)
Pre visit review using our clinic review tool, if applicable. No additional management support is needed unless otherwise documented below in the visit note. 

## 2018-01-13 NOTE — Patient Instructions (Signed)
GO TO THE LAB : Get the blood work     GO TO THE FRONT DESK Schedule your next appointment for a  Physical exam in 6 months   

## 2018-01-15 NOTE — Assessment & Plan Note (Signed)
HTN: Currently on diltiazem; recently inderal was added for migraines, BP seems to be very good. Chronic migraines: Saw neurology, they recommended propranolol as preventative; cont w/ Fioricet.  Not a good candidate for triptan's due to age. Mild cognitive impairment: Per neurology note. Chronic dizziness: She felt a great deal of benefit with vestibular rehabilitation, finished 2 months ago, would like to go back, referral sent. DJD: Still having MSK issues, sees Dr. Gershon Mussel, podiatrist, got local injection of the ankles. Osteoporosis: T score -3.2 on 11-2017, risk of fracture major problems discussed, she is strongly declined to take any medications, is under the impression the Prolia will affect her kidney. Social: Still lives by herself, drive, takes care of all her affairs RTC 6 months

## 2018-01-21 ENCOUNTER — Other Ambulatory Visit: Payer: Self-pay | Admitting: Internal Medicine

## 2018-01-26 ENCOUNTER — Telehealth: Payer: Self-pay | Admitting: *Deleted

## 2018-01-26 NOTE — Telephone Encounter (Signed)
Received Physician Orders from [place]; forwarded to provider/SLS 10/10

## 2018-01-30 MED ORDER — SUMATRIPTAN SUCCINATE 25 MG PO TABS: 25.0000 mg | ORAL_TABLET | ORAL | 0 refills | Status: DC | PRN

## 2018-01-30 NOTE — Addendum Note (Signed)
Addended by: Marcial Pacas on: 01/30/2018 05:01 PM   Modules accepted: Orders

## 2018-01-30 NOTE — Telephone Encounter (Signed)
Signed and faxed to [employer] at [phone]. Form sent for scanning.

## 2018-01-30 NOTE — Telephone Encounter (Signed)
I have called in low dose imitrex 25mg  as needed, may mix it with Aleve 1-2 tabs  Please check with patient, make sure that she did not have history of stroke and heart attack.

## 2018-01-30 NOTE — Telephone Encounter (Addendum)
Pt has called re: her butalbital-acetaminophen-caffeine (FIORICET, ESGIC) 50-325-40 MG tablet Pt states she is out of her 10 that were allotted she is asking for a call to discuss with RN .  Pt asking for a call on her mobile#

## 2018-01-30 NOTE — Telephone Encounter (Signed)
imitrex 10 tabs without refill  Until further evaluation at follow up visit

## 2018-01-30 NOTE — Addendum Note (Signed)
Addended by: Belinda Block A on: 01/30/2018 04:44 PM   Modules accepted: Orders

## 2018-01-30 NOTE — Telephone Encounter (Signed)
I called and spoke with patient and she states that she has had to use all 10 of her fioricet before the end of the month for the last couple months. She says that when she has a migraine, she almost always has to take the second pill 6 hours after the first. She has a follow up scheduled for 02/16/18 and cannot get her next refill until 02/08/18. She is wondering if you could write a new Rx for a stronger dose or something else to get her to her appt just in case she has another migraine between now and then.

## 2018-01-30 NOTE — Telephone Encounter (Signed)
I called and spoke with patient. She confirms that she does not have a history of stroke or heart attack. I confirmed her pharmacy and explained how to take the imitrex. Patient voiced understanding and appreciation. I have put in order but didn't know how many you wanted to prescribe.

## 2018-02-02 ENCOUNTER — Telehealth: Payer: Self-pay | Admitting: *Deleted

## 2018-02-02 NOTE — Telephone Encounter (Signed)
Received Physician Orders/Plan of Care from [facility]; forwarded to provider/SLS 10/17

## 2018-02-04 ENCOUNTER — Other Ambulatory Visit: Payer: Self-pay | Admitting: Internal Medicine

## 2018-02-08 NOTE — Telephone Encounter (Signed)
Orders signed and faxed to [facility] at [phone]. Form sent for scanning.  

## 2018-02-16 ENCOUNTER — Telehealth: Payer: Self-pay | Admitting: Neurology

## 2018-02-16 ENCOUNTER — Encounter: Payer: Self-pay | Admitting: Neurology

## 2018-02-16 ENCOUNTER — Ambulatory Visit: Payer: Medicare Other | Admitting: Neurology

## 2018-02-16 VITALS — BP 158/87 | HR 91 | Ht 64.0 in | Wt 163.5 lb

## 2018-02-16 DIAGNOSIS — G43709 Chronic migraine without aura, not intractable, without status migrainosus: Secondary | ICD-10-CM

## 2018-02-16 DIAGNOSIS — IMO0002 Reserved for concepts with insufficient information to code with codable children: Secondary | ICD-10-CM

## 2018-02-16 MED ORDER — VENLAFAXINE HCL 25 MG PO TABS: 25.0000 mg | ORAL_TABLET | Freq: Every day | ORAL | 11 refills | Status: DC

## 2018-02-16 MED ORDER — BUTALBITAL-APAP-CAFFEINE 50-325-40 MG PO TABS: 1.0000 | ORAL_TABLET | Freq: Four times a day (QID) | ORAL | 5 refills | Status: DC | PRN

## 2018-02-16 NOTE — Progress Notes (Signed)
PATIENT: [name] DOB: 09-11-1929  Chief Complaint  Patient presents with  . Migraine    She has continued having migraines 1-2 times per week.  Her biggest trigger is weather changes.  Fioricet is helpful but she often has to repeat the dose.  The ten allowed does not last her the month.  Sumatriptan caused abdominal pain, blurry vision and drowsiness.         HISTORICAL  [name] is 84 years old female, seen in request by her primary care physician Dr. Larose Kells, [facility], for evaluation of frequent headaches, evaluation was on November 15, 2017  She had a migraine headaches for many years, always on her right side, severe pounding headache with associated light noise sensitivity, she still has migraine sometimes, Tylenol provide limited help, she often take Flexeril 3 times each week which will put her into sleep, sleep usually helps her headaches, She now has headaches about couple times each week, lasting for few hours,  She has seen by Domingo Cocking for migraine headaches in the past, had a good response to trigger point injection in her skull region once, but not during her second trial,  She also memory loss over the past few months, brother was 52 years younger was diagnosed with dementia, sometimes she forgot her grandchildren's name, tends to misplace things, she is a retired Systems developer  I personally reviewed MRI brain in March 2019, mild generalized atrophy more on the left perisylvian fissure, no acute abnormality.  Laboratory evaluation on September 23, 2017, normal ESR 5, C-reactive protein 0.2, CMP, glucose 119, CBC, hemoglobin of 12.4,  B12 381 in Jan 2018  UPDATE Feb 16 2018: She is taking Inderal ER 60 mg tolerating it well, no significant change in her headaches, still has headaches 2-3 times each week, Fioricet as needed was helpful, but she often have to take second dose, used up to 10 tablets in 1 month, headache on the right side, retro-orbital area  pressure headaches, light noise sensitivity,  Tried Imitrex 25 mg few times, made her very sleepy,  REVIEW OF SYSTEMS: Full 14 system review of systems performed and notable only for hearing loss, ringing the ears, runny nose, abdominal pain, blurred vision, leg swelling, insomnia, joint pain, swelling, walking difficulty neck and stiffness, incontinence of bladder, memory loss dizziness, headaches  ALLERGIES: Allergies  Allergen Reactions  . Amitriptyline Hcl     REACTION: hallucinations  . Carisoprodol     REACTION: nightmares,rash,tachycardia  . Codeine     REACTION: "deathly ill" nauesa, dizzy,weak  . Fosamax [Alendronate Sodium] Other (See Comments)    Dental issues  . Meloxicam     REACTION: sensitive to sun; facial redness; may cause severe abd pain  . Metronidazole     REACTION: abd pain  . Nortriptyline Hcl     REACTION: hallucinations  . Oxaprozin     REACTION: abd pain  . Promethazine Hcl     REACTION: spastic arms and legs flailing while awake and unusual behavior while sleep - walking all over house during night  . Rofecoxib     REACTION: rash  . Sumatriptan     REACTION: abdominal pain, blurry vision, drowsiness  . Tramadol Other (See Comments)    Dizziness and constipation    HOME MEDICATIONS: Current Outpatient Medications  Medication Sig Dispense Refill  . acetaminophen (TYLENOL) 500 MG tablet Take 500 mg by mouth every 6 (six) hours as needed for moderate pain.    Marland Kitchen  amoxicillin-clavulanate (AUGMENTIN) 875-125 MG tablet Prior to dental appts    . atorvastatin (LIPITOR) 40 MG tablet Take 1 tablet (40 mg total) by mouth at bedtime. 90 tablet 2  . butalbital-acetaminophen-caffeine (FIORICET, ESGIC) 50-325-40 MG tablet Take 1 tablet by mouth every 6 (six) hours as needed for headache. Do not refill in less than one month 10 tablet 5  . celecoxib (CELEBREX) 200 MG capsule Take 200 mg by mouth daily.    Marland Kitchen CRANBERRY PO Take 1 capsule by mouth daily.    Marland Kitchen  diltiazem (TIAZAC) 120 MG 24 hr capsule Take 1 capsule (120 mg total) by mouth daily. 90 capsule 1  . gabapentin (NEURONTIN) 100 MG capsule Take 2-3 capsules (200-300 mg total) by mouth 3 (three) times daily. 810 capsule 1  . Menthol, Topical Analgesic, (BLUE-EMU MAXIMUM STRENGTH) 2.5 % LIQD Apply topically as needed.    . multivitamin (THERAGRAN) per tablet Take 1 tablet by mouth daily.      . NON FORMULARY Take 2 capsules by mouth 2 (two) times daily. [employer]	[place]    . Probiotic Product (PROBIOTIC DAILY PO) Take 2 capsules by mouth daily.     . propranolol ER (INDERAL LA) 60 MG 24 hr capsule Take 1 capsule (60 mg total) by mouth daily. 30 capsule 11  . trimethoprim (TRIMPEX) 100 MG tablet Take 100 mg by mouth daily.  11   No current facility-administered medications for this visit.     PAST MEDICAL HISTORY: Past Medical History:  Diagnosis Date  . Anxiety and depression   . Back pain    lumbar  . Fecal incontinence    Dr. Michail Sermon, likely due to loose stools in conjunction with pelvic muscle laxity from aging, recommended to stop Miralax  . GI (gastrointestinal bleed)    hx of colon polyps 2004, diverticulosis, internal hemmorhids  . Headache   . Headache(784.0)   . Hydronephrosis    Right 2008 s/p urology eval CT/cysto: observation/monitor  . Hyperlipidemia   . Hypertension   . Knee pain    right chronic  . Migraine    on topamax , per neuro  . Osteoporosis   . Pulmonary nodules    [place]. First seen by CT only 05/04/03, no change on f/u 08/08/06 -Macroscopic changes rml 04/27/07 > resolved april 6,2010 no further w/u   . Seborrheic keratosis    Left inferior posterior flank  . SVT (supraventricular tachycardia) ([place])    sees Dr.Ross    PAST SURGICAL HISTORY: Past Surgical History:  Procedure Laterality Date  . ABDOMINAL HYSTERECTOMY    . APPENDECTOMY    . BASAL CELL CARCINOMA EXCISION    . BLADDER REPAIR    . CATARACT EXTRACTION  2008   Dr.Digby  . CHOLECYSTECTOMY    .  lumbar reconstructive surgery  01/07/2009  . R ovary removed    . ROTATOR CUFF REPAIR  9/-2011  . SHOULDER SURGERY  12-2011   LEFT  . TONSILLECTOMY    . TOTAL KNEE ARTHROPLASTY     right  . trigger finger surgery     L thumb (2014), R ring finger 05-2013    FAMILY HISTORY: Family History  Problem Relation Age of Onset  . Breast cancer Mother        later in life  . Diabetes Mother        late in life  . Heart disease Mother        late onset  . Lung cancer Father  apparently had TB  . Heart disease Father        late onset  . Prostate cancer Father   . Colitis Brother   . Cancer Brother   . Colon cancer Neg Hx     SOCIAL HISTORY: Social History   Socioeconomic History  . Marital status: Widowed    Spouse name: Not on file  . Number of children: 1  . Years of education: college   . Highest education level: Doctorate  Occupational History  . Occupation: retired   Scientific laboratory technician  . Financial resource strain: Not on file  . Food insecurity:    Worry: Not on file    Inability: Not on file  . Transportation needs:    Medical: Not on file    Non-medical: Not on file  Tobacco Use  . Smoking status: Never Smoker  . Smokeless tobacco: Never Used  Substance and Sexual Activity  . Alcohol use: Yes    Comment: rarely  . Drug use: No  . Sexual activity: Not Currently  Lifestyle  . Physical activity:    Days per week: Not on file    Minutes per session: Not on file  . Stress: Not on file  Relationships  . Social connections:    Talks on phone: Not on file    Gets together: Not on file    Attends religious service: Not on file    Active member of club or organization: Not on file    Attends meetings of clubs or organizations: Not on file    Relationship status: Not on file  . Intimate partner violence:    Fear of current or ex partner: Not on file    Emotionally abused: Not on file    Physically abused: Not on file    Forced sexual activity: Not on file    Other Topics Concern  . Not on file  Social History Narrative   Moved from a retirement home to a condominium 04-2013, independent living,  lives by herself , drives    Son lives in [place]   Right handed    1 cup of caffeine per day      PHYSICAL EXAM   Vitals:   02/16/18 1125  BP: (!) 158/87  Pulse: 91  Weight: 163 lb 8 oz (74.2 kg)  Height: 5' 4"  (1.626 m)    Not recorded      Body mass index is 28.06 kg/m.  PHYSICAL EXAMNIATION:  Gen: NAD, conversant, well nourised, obese, well groomed                     Cardiovascular: Regular rate rhythm, no peripheral edema, warm, nontender. Eyes: Conjunctivae clear without exudates or hemorrhage Neck: Supple, no carotid bruits. Pulmonary: Clear to auscultation bilaterally   NEUROLOGICAL EXAM:  MENTAL STATUS: Speech:    Speech is normal; fluent and spontaneous with normal comprehension.  Cognition:     Orientation to time, place and person     Normal recent and remote memory     Normal Attention span and concentration     Normal Language, naming, repeating,spontaneous speech     Fund of knowledge   CRANIAL NERVES: CN II: Visual fields are full to confrontation. Fundoscopic exam is normal with sharp discs and no vascular changes. Pupils are round equal and briskly reactive to light. CN III, IV, VI: extraocular movement are normal. No ptosis. CN V: Facial sensation is intact to pinprick in all 3 divisions bilaterally. Corneal  responses are intact.  CN VII: Face is symmetric with normal eye closure and smile. CN VIII: Hearing is normal to rubbing fingers CN IX, X: Palate elevates symmetrically. Phonation is normal. CN XI: Head turning and shoulder shrug are intact CN XII: Tongue is midline with normal movements and no atrophy.  MOTOR: There is no pronator drift of out-stretched arms. Muscle bulk and tone are normal. Muscle strength is normal.  REFLEXES: Reflexes are 2+ and symmetric at the biceps, triceps, knees, and  ankles. Plantar responses are flexor.  SENSORY: Intact to light touch, pinprick, positional sensation and vibratory sensation are intact in fingers and toes.  COORDINATION: Rapid alternating movements and fine finger movements are intact. There is no dysmetria on finger-to-nose and heel-knee-shin.    GAIT/STANCE: Posture is normal. Gait is steady with normal steps, base, arm swing, and turning. Romberg is absent.   DIAGNOSTIC DATA (LABS, IMAGING, TESTING) - I reviewed patient records, labs, notes, testing and imaging myself where available.   ASSESSMENT AND PLAN  DEREONA KOLODNY is a 84 y.o. female   Chronic migraine headaches Mild cognitive impairment  Family history of dementia, MRI of the brain showed significant left perisylvian fissure atrophy  Normal TSH, no treatable etiology identified  Continue preventive medications propranolol ER 60 mg daily as preventive medications  Fioricet as needed for abortive treatment,  Botox injection as migraine prevention   Marcial Pacas, M.D. Ph.D.  [facility] [address], [place], [place] [address] Ph: [phone] Fax: [phone]  CC:  Colon Branch, MD

## 2018-02-16 NOTE — Patient Instructions (Signed)
Melatonin 3mg  every night for sleep  Fioricet as needed, may combine with Aleve as needed, limit the use to 2 day a week,

## 2018-02-16 NOTE — Telephone Encounter (Signed)
°

## 2018-02-20 NOTE — Telephone Encounter (Signed)
Patient called in today and scheduled with the call center.

## 2018-02-21 ENCOUNTER — Other Ambulatory Visit: Payer: Self-pay | Admitting: Internal Medicine

## 2018-02-24 ENCOUNTER — Telehealth: Payer: Self-pay | Admitting: *Deleted

## 2018-02-24 NOTE — Telephone Encounter (Signed)
Received Physician Orders/Plan of Care from [facility]; forwarded to provider/SLS 11/08

## 2018-02-27 NOTE — Telephone Encounter (Signed)
Orders signed and faxed to [facility] at [phone]. Form sent for scanning.

## 2018-03-12 ENCOUNTER — Other Ambulatory Visit: Payer: Self-pay | Admitting: Neurology

## 2018-03-20 ENCOUNTER — Telehealth: Payer: Self-pay | Admitting: Neurology

## 2018-03-20 ENCOUNTER — Telehealth: Payer: Self-pay

## 2018-03-20 ENCOUNTER — Encounter: Payer: Self-pay | Admitting: *Deleted

## 2018-03-20 NOTE — Telephone Encounter (Signed)
Per Dr. Krista Blue it is ok for patient to come in see a NP to discuss taking Seroquel. She has been scheduled for 03/22/18 at 1:00 pm with [facility].

## 2018-03-20 NOTE — Telephone Encounter (Signed)
Please check on patient, if needed, Np can see patient soon, consider seroquel 25mg  qhs,

## 2018-03-20 NOTE — Telephone Encounter (Addendum)
Spoke to patient - states her hallucinations have resolved since stopping venlafaxine.  Her follow up with Jinny Blossom has been canceled.  She will keep her pending appt with Dr. Krista Blue on 03/29/18.

## 2018-03-20 NOTE — Telephone Encounter (Signed)
Patient reported vivid visual hallucinations, :"people sitting on my sofa '- she knew that these were not real. Wanted permission to stop effexor, and I gave it. Friday  12.00 noon.

## 2018-03-22 ENCOUNTER — Ambulatory Visit: Payer: Self-pay | Admitting: Adult Health

## 2018-03-22 ENCOUNTER — Telehealth: Payer: Self-pay | Admitting: Neurology

## 2018-03-22 NOTE — Telephone Encounter (Signed)
I called [employer] and spoke with a representative to inquire about Botox precertifications. Botox B/B 64615 and [id number]=NPR Ref#[id number]

## 2018-03-29 ENCOUNTER — Telehealth: Payer: Self-pay | Admitting: Neurology

## 2018-03-29 ENCOUNTER — Encounter: Payer: Self-pay | Admitting: Neurology

## 2018-03-29 ENCOUNTER — Ambulatory Visit (INDEPENDENT_AMBULATORY_CARE_PROVIDER_SITE_OTHER): Payer: Medicare Other | Admitting: Neurology

## 2018-03-29 VITALS — BP 164/81 | HR 58 | Ht 64.0 in | Wt 164.8 lb

## 2018-03-29 DIAGNOSIS — G43709 Chronic migraine without aura, not intractable, without status migrainosus: Secondary | ICD-10-CM

## 2018-03-29 DIAGNOSIS — IMO0002 Reserved for concepts with insufficient information to code with codable children: Secondary | ICD-10-CM

## 2018-03-29 MED ORDER — ONABOTULINUMTOXINA 100 UNITS IJ SOLR
200.0000 [IU] | Freq: Once | INTRAMUSCULAR | Status: AC
  Administered 2018-03-29: 200 [IU] via INTRAMUSCULAR

## 2018-03-29 NOTE — Progress Notes (Signed)
PATIENT: [name] DOB: Mar 03, 1930  Chief Complaint  Patient presents with  . Migraine    Botox 100 units x 2 vials - office supply.  Reports venlafaxine caused hallucinations and she has stopped this medication.     HISTORICAL  [name] is 84 years old female, seen in request by her primary care physician Dr. Larose Kells, [facility], for evaluation of frequent headaches, evaluation was on November 15, 2017  She had a migraine headaches for many years, always on her right side, severe pounding headache with associated light noise sensitivity, she still has migraine sometimes, Tylenol provide limited help, she often take Flexeril 3 times each week which will put her into sleep, sleep usually helps her headaches, She now has headaches about couple times each week, lasting for few hours,  She has seen by Dr Domingo Cocking for migraine headaches in the past, had a good response to trigger point injection in her skull region once, but not during her second trial,  She also memory loss over the past few months, brother was 54 years younger was diagnosed with dementia, sometimes she forgot her grandchildren's name, tends to misplace things, she is a retired Systems developer  I personally reviewed MRI brain in March 2019, mild generalized atrophy more on the left perisylvian fissure, no acute abnormality.  Laboratory evaluation on September 23, 2017, normal ESR 5, C-reactive protein 0.2, CMP, glucose 119, CBC, hemoglobin of 12.4,  B12 381 in Jan 2018  UPDATE Feb 16 2018: She is taking Inderal ER 60 mg tolerating it well, no significant change in her headaches, still has headaches 2-3 times each week, Fioricet as needed was helpful, but she often have to take second dose, used up to 10 tablets in 1 month, headache on the right side, retro-orbital area pressure headaches, light noise sensitivity,  Tried Imitrex 25 mg few times, made her very sleepy,  UPDATE Mar 29 2018: This is her first  BOTOX Injection with me for headaches.  REVIEW OF SYSTEMS: Full 14 system review of systems performed and notable only for above  All rest review of systems were negative.  ALLERGIES: Allergies  Allergen Reactions  . Amitriptyline Hcl     REACTION: hallucinations  . Carisoprodol     REACTION: nightmares,rash,tachycardia  . Codeine     REACTION: "deathly ill" nauesa, dizzy,weak  . Fosamax [Alendronate Sodium] Other (See Comments)    Dental issues  . Meloxicam     REACTION: sensitive to sun; facial redness; may cause severe abd pain  . Metronidazole     REACTION: abd pain  . Nortriptyline Hcl     REACTION: hallucinations  . Oxaprozin     REACTION: abd pain  . Promethazine Hcl     REACTION: spastic arms and legs flailing while awake and unusual behavior while sleep - walking all over house during night  . Rofecoxib     REACTION: rash  . Sumatriptan     REACTION: abdominal pain, blurry vision, drowsiness  . Tramadol Other (See Comments)    Dizziness and constipation  . Venlafaxine     Hallucinations     HOME MEDICATIONS: Current Outpatient Medications  Medication Sig Dispense Refill  . acetaminophen (TYLENOL) 500 MG tablet Take 500 mg by mouth every 6 (six) hours as needed for moderate pain.    Marland Kitchen amoxicillin-clavulanate (AUGMENTIN) 875-125 MG tablet Prior to dental appts    . atorvastatin (LIPITOR) 40 MG tablet Take 1 tablet (40 mg total)  by mouth at bedtime. 90 tablet 2  . botulinum toxin Type A (BOTOX) 100 units SOLR injection Inject 200 Units into the muscle every 3 (three) months.    . butalbital-acetaminophen-caffeine (FIORICET, ESGIC) 50-325-40 MG tablet Take 1 tablet by mouth every 6 (six) hours as needed for headache. Do not refill in less than one month, cancel previous Rx 12 tablet 5  . celecoxib (CELEBREX) 200 MG capsule Take 200 mg by mouth daily.    Marland Kitchen CRANBERRY PO Take 1 capsule by mouth daily.    Marland Kitchen diltiazem (TIAZAC) 120 MG 24 hr capsule Take 1 capsule (120  mg total) by mouth daily. 90 capsule 2  . gabapentin (NEURONTIN) 100 MG capsule Take 2-3 capsules (200-300 mg total) by mouth 3 (three) times daily. 810 capsule 1  . Menthol, Topical Analgesic, (BLUE-EMU MAXIMUM STRENGTH) 2.5 % LIQD Apply topically as needed.    . multivitamin (THERAGRAN) per tablet Take 1 tablet by mouth daily.      . NON FORMULARY Take 2 capsules by mouth 2 (two) times daily. [employer]	[place]    . Probiotic Product (PROBIOTIC DAILY PO) Take 2 capsules by mouth daily.     . propranolol ER (INDERAL LA) 60 MG 24 hr capsule Take 1 capsule (60 mg total) by mouth daily. 30 capsule 11  . trimethoprim (TRIMPEX) 100 MG tablet Take 100 mg by mouth daily.  11  . venlafaxine (EFFEXOR) 25 MG tablet TAKE 1 TABLET BY MOUTH EVERYDAY AT BEDTIME 90 tablet 4   No current facility-administered medications for this visit.     PAST MEDICAL HISTORY: Past Medical History:  Diagnosis Date  . Anxiety and depression   . Back pain    lumbar  . Fecal incontinence    Dr. Michail Sermon, likely due to loose stools in conjunction with pelvic muscle laxity from aging, recommended to stop Miralax  . GI (gastrointestinal bleed)    hx of colon polyps 2004, diverticulosis, internal hemmorhids  . Headache   . Headache(784.0)   . Hydronephrosis    Right 2008 s/p urology eval CT/cysto: observation/monitor  . Hyperlipidemia   . Hypertension   . Knee pain    right chronic  . Migraine    on topamax , per neuro  . Osteoporosis   . Pulmonary nodules    [facility]. First seen by CT only 05/04/03, no change on f/u 08/08/06 -Macroscopic changes rml 04/27/07 > resolved april 6,2010 no further w/u   . Seborrheic keratosis    Left inferior posterior flank  . SVT (supraventricular tachycardia) ([facility])    sees Dr.Ross    PAST SURGICAL HISTORY: Past Surgical History:  Procedure Laterality Date  . ABDOMINAL HYSTERECTOMY    . APPENDECTOMY    . BASAL CELL CARCINOMA EXCISION    . BLADDER REPAIR    . CATARACT EXTRACTION  2008    Dr.Digby  . CHOLECYSTECTOMY    . lumbar reconstructive surgery  01/07/2009  . R ovary removed    . ROTATOR CUFF REPAIR  9/-2011  . SHOULDER SURGERY  12-2011   LEFT  . TONSILLECTOMY    . TOTAL KNEE ARTHROPLASTY     right  . trigger finger surgery     L thumb (2014), R ring finger 05-2013    FAMILY HISTORY: Family History  Problem Relation Age of Onset  . Breast cancer Mother        later in life  . Diabetes Mother        late in life  .  Heart disease Mother        late onset  . Lung cancer Father        apparently had TB  . Heart disease Father        late onset  . Prostate cancer Father   . Colitis Brother   . Cancer Brother   . Colon cancer Neg Hx     SOCIAL HISTORY: Social History   Socioeconomic History  . Marital status: Widowed    Spouse name: Not on file  . Number of children: 1  . Years of education: college   . Highest education level: Doctorate  Occupational History  . Occupation: retired   Scientific laboratory technician  . Financial resource strain: Not on file  . Food insecurity:    Worry: Not on file    Inability: Not on file  . Transportation needs:    Medical: Not on file    Non-medical: Not on file  Tobacco Use  . Smoking status: Never Smoker  . Smokeless tobacco: Never Used  Substance and Sexual Activity  . Alcohol use: Yes    Comment: rarely  . Drug use: No  . Sexual activity: Not Currently  Lifestyle  . Physical activity:    Days per week: Not on file    Minutes per session: Not on file  . Stress: Not on file  Relationships  . Social connections:    Talks on phone: Not on file    Gets together: Not on file    Attends religious service: Not on file    Active member of club or organization: Not on file    Attends meetings of clubs or organizations: Not on file    Relationship status: Not on file  . Intimate partner violence:    Fear of current or ex partner: Not on file    Emotionally abused: Not on file    Physically abused: Not on file     Forced sexual activity: Not on file  Other Topics Concern  . Not on file  Social History Narrative   Moved from a retirement home to a condominium 04-2013, independent living,  lives by herself , drives    Son lives in [place]   Right handed    1 cup of caffeine per day      PHYSICAL EXAM   Vitals:   03/29/18 1316  BP: (!) 164/81  Pulse: (!) 58  Weight: 164 lb 12 oz (74.7 kg)  Height: 5' 4"  (1.626 m)    Not recorded      Body mass index is 28.28 kg/m.  PHYSICAL EXAMNIATION:  Gen: NAD, conversant, well nourised, obese, well groomed                     Cardiovascular: Regular rate rhythm, no peripheral edema, warm, nontender. Eyes: Conjunctivae clear without exudates or hemorrhage Neck: Supple, no carotid bruits. Pulmonary: Clear to auscultation bilaterally   NEUROLOGICAL EXAM:  MENTAL STATUS: Speech:    Speech is normal; fluent and spontaneous with normal comprehension.  Cognition:     Orientation to time, place and person     Normal recent and remote memory     Normal Attention span and concentration     Normal Language, naming, repeating,spontaneous speech     Fund of knowledge   CRANIAL NERVES: CN II: Visual fields are full to confrontation. Pupils are round equal and briskly reactive to light. CN III, IV, VI: extraocular movement are normal. No ptosis. CN  V: Facial sensation is intact to pinprick in all 3 divisions bilaterally. Corneal responses are intact.  CN VII: Face is symmetric with normal eye closure and smile. CN VIII: Hearing is normal to rubbing fingers CN IX, X: Palate elevates symmetrically. Phonation is normal. CN XI: [name] turning and shoulder shrug are intact CN XII: Tongue is midline with normal movements and no atrophy.  MOTOR: There is no pronator drift of out-stretched arms. Muscle bulk and tone are normal. Muscle strength is normal.  REFLEXES: Reflexes are 2+ and symmetric at the biceps, triceps, knees, and ankles. Plantar responses  are flexor.  SENSORY: Intact to light touch, pinprick, positional sensation and vibratory sensation are intact in fingers and toes.  COORDINATION: Rapid alternating movements and fine finger movements are intact. There is no dysmetria on finger-to-nose and heel-knee-shin.    GAIT/STANCE: Posture is normal. Gait is steady with normal steps, base, arm swing, and turning. Romberg is absent.   DIAGNOSTIC DATA (LABS, IMAGING, TESTING) - I reviewed patient records, labs, notes, testing and imaging myself where available.   ASSESSMENT AND PLAN  [name] is a 84 y.o. female   Chronic migraine headaches Mild cognitive impairment  Family history of dementia, MRI of the brain showed significant left perisylvian fissure atrophy  Normal TSH, no treatable etiology identified  Continue preventive medications propranolol ER 60 mg daily as preventive medications  Fioricet as needed for abortive treatment,  Botox injection as migraine prevention, 1st injection Mar 29 2018.  Botox injection for chronic migraine prevention, injection was performed according to [place] protocol,  5 units of Botox was injected into each side, for 31 injection sites, total of 155 units  Bilateral frontalis 4 injection sites Bilateral corrugate 2 injection sites Procerus 1 injection sites. Bilateral temporalis 8 injection sites Bilateral occipitalis 6 injection sites Bilateral cervical paraspinals 4 injection sites Bilateral upper trapezius 6 injection sites  Extra 45 unites were injected into left parietal temporal region   [name], M.D. Ph.D.  [facility] [address] [place], [place] [address] Ph: [phone] Fax: [phone]  CC:  Colon Branch, MD

## 2018-03-29 NOTE — Progress Notes (Signed)
**  Botox 100 units x 2 vials, NDC 1275-1700-17, Lot [id number], Exp 02/2020, office supply.//mck,rn**

## 2018-03-29 NOTE — Telephone Encounter (Signed)
noted 

## 2018-03-29 NOTE — Telephone Encounter (Signed)
Do not dissolve BOTOX until I talk with her on next visit on June 28 2018

## 2018-03-29 NOTE — Addendum Note (Signed)
Addended by: Marcial Pacas on: 03/29/2018 02:55 PM   Modules accepted: Orders

## 2018-03-31 ENCOUNTER — Telehealth: Payer: Self-pay

## 2018-03-31 DIAGNOSIS — I10 Essential (primary) hypertension: Secondary | ICD-10-CM

## 2018-03-31 NOTE — Telephone Encounter (Signed)
Copied from [id number] [id number]. Topic: Referral - Request for Referral >> Mar 30, 2018  1:23 PM Antonieta Iba C wrote: Pt use to see Cardiologist Dr. Harrington Challenger. ([facility])  Pt called to schedule ov with them and was told that a referral is needed since pt hasn't seen provider in a while. Pt would like further assistance.    Phone: [phone]

## 2018-03-31 NOTE — Telephone Encounter (Signed)
Referral placed.

## 2018-04-13 ENCOUNTER — Telehealth: Payer: Self-pay | Admitting: Neurology

## 2018-04-13 NOTE — Telephone Encounter (Signed)
She had a Botox injection as migraine prevention on March 29, 2018, a week afterwards, she noticed right upper eyelid drooping, intermittent, sometimes worse than the other, do block her right vision sometimes,  on examination, she has fatigable right ptosis, mild right eye closure weakness, there was no significant bulbar muscle weakness otherwise, findings are most consistent with side effect from Botox injection, hope to peaking 2 to 3 weeks, and eventually recover to baseline

## 2018-04-13 NOTE — Telephone Encounter (Signed)
Spoke to patient - she is concerned and would like to be examined as soon as possible.  States she has been able to drive but just has to be really careful.  She has been asked to come to our office now and can be here in 20 minutes.  She was very Patent attorney.

## 2018-04-13 NOTE — Telephone Encounter (Signed)
Pt has called to inform that as a result of her last Botox appointment her right eye is almost closed entirely.  Pt is having constant tearing, raised eyebrow and lower lid.  Pt states he is also having loss of bladder.  Pt has accepted 1st available office visit and is on wait list.  Pt is asking to be called about getting in before March because she  Is unable to drive as a result of the situation with her eye, this is preventing her from going to her part time job, please call

## 2018-04-17 ENCOUNTER — Ambulatory Visit: Payer: Self-pay

## 2018-04-17 NOTE — Progress Notes (Signed)
Cardiology Office Note:    Date:  04/18/2018   ID:  [name], DOB Nov 24, 1929, MRN [id number]  PCP:  Colon Branch, MD  Cardiologist:  Shirlee More, MD   Referring MD: Colon Branch, MD  ASSESSMENT:    1. Essential hypertension   2. Edema of both lower extremities   3. SVT (supraventricular tachycardia) (HCC)    PLAN:    In order of problems listed above:  1. Poorly controlled not at target asked her to fully sodium restrict continue her beta-blocker she is taking for migraine prophylaxis negative ARB thiazide diuretic which should push her to target.  She will withdraw calcium channel blocker contributing to edema 2. Multi-factorial including sodium added to her diet gabapentin diltiazem and the possibility of heart failure.  Medications altered check proBNP level echocardiogram regarding heart failure 3. Stable no clinical recurrence continue her beta-blocker she is taking for migraine prophylaxis  Next appointment   Medication Adjustments/Labs and Tests Ordered: Current medicines are reviewed at length with the patient today.  Concerns regarding medicines are outlined above.  Orders Placed This Encounter  Procedures  . Pro b natriuretic peptide (BNP)  . Basic Metabolic Panel (BMET)  . EKG 12-Lead  . ECHOCARDIOGRAM COMPLETE   Meds ordered this encounter  Medications  . telmisartan-hydrochlorothiazide (MICARDIS HCT) 40-12.5 MG tablet    Sig: Take 1 tablet by mouth daily.    Dispense:  30 tablet    Refill:  3     No chief complaint on file.   History of Present Illness:    [name] is a 84 y.o. female who is being seen today for the evaluation of hypertension  at the request of Colon Branch, MD.  Because of ankle edema she called and tried to be seen by Dr. Harrington Challenger last seen by her in 2014 for hypertension hyperlipidemia and palpitation.  She has noticed recently she has ankle edema worse during the day she had salt her diet she takes gabapentin which  causes intense sodium retention also takes a calcium channel blocker for migraine.  She is short of breath when she walks a longer distance no orthopnea chest pain palpitation or syncope she is also taking a beta-blocker propranolol for migraine.  She has no history of documented arrhythmia coronary disease or heart failure.  She is concerned the peripheral edema is a reflection of underlying heart disease.  I told her I suspect that the culprit here is related to medications gabapentin she is unable to live without with back pain calcium channel blocker which we can discontinue substitute ARB diuretic for better blood pressure control and adding salt to her diet which have asked her to stop.  For further evaluation check a proBNP level and echocardiogram regarding heart failure with hypertension.  She is not having chest pain I do not think she needs an ischemic evaluation.  Her EKG in my office today shows sinus rhythm and is normal.   Past Medical History:  Diagnosis Date  . Anxiety and depression   . Back pain    lumbar  . Fecal incontinence    Dr. Michail Sermon, likely due to loose stools in conjunction with pelvic muscle laxity from aging, recommended to stop Miralax  . GI (gastrointestinal bleed)    hx of colon polyps 2004, diverticulosis, internal hemmorhids  . Headache   . Headache(784.0)   . Hydronephrosis    Right 2008 s/p urology eval CT/cysto: observation/monitor  . Hyperlipidemia   .  Hypertension   . Knee pain    right chronic  . Migraine    on topamax , per neuro  . Osteoporosis   . Pulmonary nodules    [facility]. First seen by CT only 05/04/03, no change on f/u 08/08/06 -Macroscopic changes rml 04/27/07 > resolved april 6,2010 no further w/u   . Seborrheic keratosis    Left inferior posterior flank  . SVT (supraventricular tachycardia) (HCC)    sees Dr.Ross    Past Surgical History:  Procedure Laterality Date  . ABDOMINAL HYSTERECTOMY    . APPENDECTOMY    . BASAL CELL CARCINOMA  EXCISION    . BLADDER REPAIR    . CATARACT EXTRACTION  2008   Dr.Digby  . CHOLECYSTECTOMY    . lumbar reconstructive surgery  01/07/2009  . R ovary removed    . ROTATOR CUFF REPAIR  9/-2011  . SHOULDER SURGERY  12-2011   LEFT  . TONSILLECTOMY    . TOTAL KNEE ARTHROPLASTY     right  . trigger finger surgery     L thumb (2014), R ring finger 05-2013    Current Medications: Current Meds  Medication Sig  . acetaminophen (TYLENOL) 500 MG tablet Take 500 mg by mouth every 6 (six) hours as needed for moderate pain.  Marland Kitchen amoxicillin-clavulanate (AUGMENTIN) 875-125 MG tablet Prior to dental appts  . atorvastatin (LIPITOR) 40 MG tablet Take 1 tablet (40 mg total) by mouth at bedtime.  . butalbital-acetaminophen-caffeine (FIORICET, ESGIC) 50-325-40 MG tablet Take 1 tablet by mouth every 6 (six) hours as needed for headache. Do not refill in less than one month, cancel previous Rx  . celecoxib (CELEBREX) 200 MG capsule Take 200 mg by mouth daily.  Marland Kitchen CRANBERRY PO Take 1 capsule by mouth daily.  Marland Kitchen gabapentin (NEURONTIN) 100 MG capsule Take 2-3 capsules (200-300 mg total) by mouth 3 (three) times daily.  . Menthol, Topical Analgesic, (BLUE-EMU MAXIMUM STRENGTH) 2.5 % LIQD Apply topically as needed.  . multivitamin (THERAGRAN) per tablet Take 1 tablet by mouth daily.    . NON FORMULARY Take 2 capsules by mouth 2 (two) times daily. [employer]	[place]  . Probiotic Product (PROBIOTIC DAILY PO) Take 1 capsule by mouth daily.   . propranolol ER (INDERAL LA) 60 MG 24 hr capsule Take 1 capsule (60 mg total) by mouth daily.  Marland Kitchen trimethoprim (TRIMPEX) 100 MG tablet Take 100 mg by mouth daily.  Marland Kitchen venlafaxine (EFFEXOR) 25 MG tablet TAKE 1 TABLET BY MOUTH EVERYDAY AT BEDTIME  . [DISCONTINUED] diltiazem (TIAZAC) 120 MG 24 hr capsule Take 1 capsule (120 mg total) by mouth daily.     Allergies:   Amitriptyline hcl; Carisoprodol; Codeine; Fosamax [alendronate sodium]; Meloxicam; Metronidazole; Nortriptyline hcl; Oxaprozin;  Promethazine hcl; Rofecoxib; Sumatriptan; Tramadol; and Venlafaxine   Social History   Socioeconomic History  . Marital status: Widowed    Spouse name: Not on file  . Number of children: 1  . Years of education: college   . Highest education level: Doctorate  Occupational History  . Occupation: retired   Scientific laboratory technician  . Financial resource strain: Not on file  . Food insecurity:    Worry: Not on file    Inability: Not on file  . Transportation needs:    Medical: Not on file    Non-medical: Not on file  Tobacco Use  . Smoking status: Never Smoker  . Smokeless tobacco: Never Used  Substance and Sexual Activity  . Alcohol use: Yes    Alcohol/week: 1.0  standard drinks    Types: 1 Glasses of wine per week    Comment: rarely, 1 glass of wine monthly  . Drug use: No  . Sexual activity: Not Currently  Lifestyle  . Physical activity:    Days per week: Not on file    Minutes per session: Not on file  . Stress: Not on file  Relationships  . Social connections:    Talks on phone: Not on file    Gets together: Not on file    Attends religious service: Not on file    Active member of club or organization: Not on file    Attends meetings of clubs or organizations: Not on file    Relationship status: Not on file  Other Topics Concern  . Not on file  Social History Narrative   Moved from a retirement home to a condominium 04-2013, independent living,  lives by herself , drives    Son lives in [place]   Right handed    1 cup of caffeine per day      Family History: The patient's family history includes Breast cancer in her mother; Cancer in her brother; Colitis in her brother; Diabetes in her mother; Heart disease in her father and mother; Lung cancer in her father; Prostate cancer in her father. There is no history of Colon cancer.  ROS:   Review of Systems  Constitution: Negative.  HENT: Positive for hearing loss.   Eyes: Negative.   Cardiovascular: Positive for dyspnea on  exertion and leg swelling.  Respiratory: Positive for shortness of breath.   Endocrine: Negative.   Hematologic/Lymphatic: Negative.   Skin: Negative.   Musculoskeletal: Positive for myalgias.  Gastrointestinal: Positive for abdominal pain and diarrhea.  Genitourinary: Negative.   Neurological: Positive for dizziness and headaches.  Psychiatric/Behavioral: Negative.   Allergic/Immunologic: Negative.    Please see the history of present illness.     All other systems reviewed and are negative.  EKGs/Labs/Other Studies Reviewed:    The following studies were reviewed today: Dr. Alan Ripper office visit 2014 reviewed with the patient during the visit  EKG:  EKG is  ordered today.  The ekg ordered today demonstrates Brandon normal EKG  Recent Labs: 07/06/2017: ALT 9; BUN 18; Creatinine, Ser 0.91; Hemoglobin 12.4; Platelets 359.0; Potassium 4.8; Sodium 140 11/15/2017: TSH 2.280  Recent Lipid Panel    Component Value Date/Time   CHOL 136 01/13/2018 1048   TRIG 141.0 01/13/2018 1048   HDL 54.80 01/13/2018 1048   CHOLHDL 2 01/13/2018 1048   VLDL 28.2 01/13/2018 1048   LDLCALC 53 01/13/2018 1048   LDLDIRECT 78.0 12/20/2012 1148    Physical Exam:    VS:  BP (!) 164/92 (BP Location: Left Arm, Patient Position: Sitting, Cuff Size: Normal)   Pulse 61   Ht 5\' 4"  (1.626 m)   Wt 163 lb 1.9 oz (74 kg)   SpO2 97%   BMI 28.00 kg/m     Wt Readings from Last 3 Encounters:  04/18/18 163 lb 1.9 oz (74 kg)  03/29/18 164 lb 12 oz (74.7 kg)  02/16/18 163 lb 8 oz (74.2 kg)     GEN:  Well nourished, well developed in no acute distress HEENT: Normal NECK: No JVD; No carotid bruits LYMPHATICS: No lymphadenopathy CARDIAC: RRR, no murmurs, rubs, gallops RESPIRATORY:  Clear to auscultation without rales, wheezing or rhonchi  ABDOMEN: Soft, non-tender, non-distended MUSCULOSKELETAL:  1-2+ at the ankle edema; No deformity  SKIN: Warm and dry NEUROLOGIC:  Alert and oriented x 3 PSYCHIATRIC:  Normal  affect     Signed, Shirlee More, MD  04/18/2018 1:15 PM    [facility]

## 2018-04-17 NOTE — Telephone Encounter (Signed)
Patient called in with c/o "neck pain." She says "I fell on Thanksgiving and hit the back of my head. I felt fine afterwards, but the past 3 days, my neck started hurting and is getting worse. The pain is in my neck, goes down to my upper back. The pain is moderate. I've been taking Aleve." I asked about other symptoms, she says "a slight headache." According to protocol, see PCP within 3 days. No availability with PCP, appointment scheduled for Thursday, 04/20/18 at 1300 with Mackie Pai, [facility], care advice given, patient verbalized understanding.  Reason for Disposition . [1] MODERATE neck pain (e.g., interferes with normal activities AND [2] present > 3 days  Answer Assessment - Initial Assessment Questions 1. ONSET: "When did the pain begin?"      3 days ago 2. LOCATION: "Where does it hurt?"      Neck 3. PATTERN "Does the pain come and go, or has it been constant since it started?"      Come and go 4. SEVERITY: "How bad is the pain?"  (Scale 1-10; or mild, moderate, severe)   - MILD (1-3): doesn't interfere with normal activities    - MODERATE (4-7): interferes with normal activities or awakens from sleep    - SEVERE (8-10):  excruciating pain, unable to do any normal activities      Moderate 5. RADIATION: "Does the pain go anywhere else, shoot into your arms?"     Upper back 6. CORD SYMPTOMS: "Any weakness or numbness of the arms or legs?"     No 7. CAUSE: "What do you think is causing the neck pain?"     From the fall on Thanksgiving, hit back of head 8. NECK OVERUSE: "Any recent activities that involved turning or twisting the neck?"     No 9. OTHER SYMPTOMS: "Do you have any other symptoms?" (e.g., headache, fever, chest pain, difficulty breathing, neck swelling)     Headache 10. PREGNANCY: "Is there any chance you are pregnant?" "When was your last menstrual period?"       No  Protocols used: NECK PAIN OR STIFFNESS-A-AH

## 2018-04-18 ENCOUNTER — Encounter: Payer: Self-pay | Admitting: Cardiology

## 2018-04-18 ENCOUNTER — Ambulatory Visit (INDEPENDENT_AMBULATORY_CARE_PROVIDER_SITE_OTHER): Payer: Medicare Other | Admitting: Cardiology

## 2018-04-18 VITALS — BP 164/92 | HR 61 | Ht 64.0 in | Wt 163.1 lb

## 2018-04-18 DIAGNOSIS — I10 Essential (primary) hypertension: Secondary | ICD-10-CM | POA: Diagnosis not present

## 2018-04-18 DIAGNOSIS — I471 Supraventricular tachycardia: Secondary | ICD-10-CM | POA: Diagnosis not present

## 2018-04-18 DIAGNOSIS — R600 Localized edema: Secondary | ICD-10-CM

## 2018-04-18 MED ORDER — TELMISARTAN-HCTZ 40-12.5 MG PO TABS: 1.0000 | ORAL_TABLET | Freq: Every day | ORAL | 3 refills | Status: DC

## 2018-04-18 NOTE — Patient Instructions (Addendum)
Medication Instructions:  Your physician has recommended you make the following change in your medication:   STOP diltiazem  START telmisartan-hydrochlorothiazide (micardis-HCT) 40-12.5 mg: Take 1 tablet daily   If you need a refill on your cardiac medications before your next appointment, please call your pharmacy.   Lab work: Your physician recommends that you return for lab work today: BMP, ProBNP.   If you have labs (blood work) drawn today and your tests are completely normal, you will receive your results only by: Marland Kitchen MyChart Message (if you have MyChart) OR . A paper copy in the mail If you have any lab test that is abnormal or we need to change your treatment, we will call you to review the results.  Testing/Procedures: You had an EKG today.   Your physician has requested that you have an echocardiogram. Echocardiography is a painless test that uses sound waves to create images of your heart. It provides your doctor with information about the size and shape of your heart and how well your heart's chambers and valves are working. This procedure takes approximately one hour. There are no restrictions for this procedure.   Follow-Up: At [facility], you and your health needs are our priority.  As part of our continuing mission to provide you with exceptional heart care, we have created designated Provider Care Teams.  These Care Teams include your primary Cardiologist (physician) and Advanced Practice Providers (APPs -  Physician Assistants and Nurse Practitioners) who all work together to provide you with the care you need, when you need it. You will need a follow up appointment in 3 weeks.     **Please do not add additional salt to food!    DASH diet: Healthy eating to lower your blood pressure The DASH diet emphasizes portion size, eating a variety of foods and getting the right amount of nutrients. Discover how DASH can improve your health and lower your blood pressure. By  [facility] Staff  DASH stands for Dietary Approaches to Stop Hypertension. The DASH diet is a lifelong approach to healthy eating that's designed to help treat or prevent high blood pressure (hypertension). The DASH diet encourages you to reduce the sodium in your diet and eat a variety of foods rich in nutrients that help lower blood pressure, such as potassium, calcium and magnesium. By following the DASH diet, you may be able to reduce your blood pressure by a few points in just two weeks. Over time, your systolic blood pressure could drop by eight to 14 points, which can make a significant difference in your health risks. Because the DASH diet is a healthy way of eating, it offers health benefits besides just lowering blood pressure. The DASH diet is also in line with dietary recommendations to prevent osteoporosis, cancer, heart disease, stroke and diabetes. DASH diet: Sodium levels The DASH diet emphasizes vegetables, fruits and low-fat dairy foods - and moderate amounts of whole grains, fish, poultry and nuts. In addition to the standard DASH diet, there is also a lower sodium version of the diet. You can choose the version of the diet that meets your health needs: Standard DASH diet. You can consume up to 2,300 milligrams (mg) of sodium a day.  Lower sodium DASH diet. You can consume up to 1,500 mg of sodium a day. Both versions of the DASH diet aim to reduce the amount of sodium in your diet compared with what you might get in a typical American diet, which can amount to a  whopping 3,400 mg of sodium a day or more. The standard DASH diet meets the recommendation from the Dietary Guidelines for Americans to keep daily sodium intake to less than 2,300 mg a day. The American Heart Association recommends 1,500 mg a day of sodium as an upper limit for all adults. If you aren't sure what sodium level is right for you, talk to your doctor. DASH diet: What to eat Both versions of the DASH diet include  lots of whole grains, fruits, vegetables and low-fat dairy products. The DASH diet also includes some fish, poultry and legumes, and encourages a small amount of nuts and seeds a few times a week.  You can eat red meat, sweets and fats in small amounts. The DASH diet is low in saturated fat, cholesterol and total fat. Here's a look at the recommended servings from each food group for the 2,000-calorie-a-day DASH diet. Grains: 6 to 8 servings a day Grains include bread, cereal, rice and pasta. Examples of one serving of grains include 1 slice whole-wheat bread, 1 ounce dry cereal, or 1/2 cup cooked cereal, rice or pasta. Focus on whole grains because they have more fiber and nutrients than do refined grains. For instance, use brown rice instead of white rice, whole-wheat pasta instead of regular pasta and whole-grain bread instead of white bread. Look for products labeled "100 percent whole grain" or "100 percent whole wheat."  Grains are naturally low in fat. Keep them this way by avoiding butter, cream and cheese sauces. Vegetables: 4 to 5 servings a day Tomatoes, carrots, broccoli, sweet potatoes, greens and other vegetables are full of fiber, vitamins, and such minerals as potassium and magnesium. Examples of one serving include 1 cup raw leafy green vegetables or 1/2 cup cut-up raw or cooked vegetables. Don't think of vegetables only as side dishes - a hearty blend of vegetables served over brown rice or whole-wheat noodles can serve as the main dish for a meal.  Fresh and frozen vegetables are both good choices. When buying frozen and canned vegetables, choose those labeled as low sodium or without added salt.  To increase the number of servings you fit in daily, be creative. In a stir-fry, for instance, cut the amount of meat in half and double up on the vegetables. Fruits: 4 to 5 servings a day Many fruits need little preparation to become a healthy part of a meal or snack. Like vegetables,  they're packed with fiber, potassium and magnesium and are typically low in fat - coconuts are an exception. Examples of one serving include one medium fruit, 1/2 cup fresh, frozen or canned fruit, or 4 ounces of juice. Have a piece of fruit with meals and one as a snack, then round out your day with a dessert of fresh fruits topped with a dollop of low-fat yogurt.  Leave on edible peels whenever possible. The peels of apples, pears and most fruits with pits add interesting texture to recipes and contain healthy nutrients and fiber.  Remember that citrus fruits and juices, such as grapefruit, can interact with certain medications, so check with your doctor or pharmacist to see if they're OK for you.  If you choose canned fruit or juice, make sure no sugar is added. Dairy: 2 to 3 servings a day Milk, yogurt, cheese and other dairy products are major sources of calcium, vitamin D and protein. But the key is to make sure that you choose dairy products that are low fat or fat-free because otherwise they  can be a major source of fat - and most of it is saturated. Examples of one serving include 1 cup skim or 1 percent milk, 1 cup low fat yogurt, or 1 1/2 ounces part-skim cheese. Low-fat or fat-free frozen yogurt can help you boost the amount of dairy products you eat while offering a sweet treat. Add fruit for a healthy twist.  If you have trouble digesting dairy products, choose lactose-free products or consider taking an over-the-counter product that contains the enzyme lactase, which can reduce or prevent the symptoms of lactose intolerance.  Go easy on regular and even fat-free cheeses because they are typically high in sodium. Lean meat, poultry and fish: 6 servings or fewer a day Meat can be a rich source of protein, B vitamins, iron and zinc. Choose lean varieties and aim for no more than 6 ounces a day. Cutting back on your meat portion will allow room for more vegetables. Trim away skin and fat from  poultry and meat and then bake, broil, grill or roast instead of frying in fat.  Eat heart-healthy fish, such as salmon, herring and tuna. These types of fish are high in omega-3 fatty acids, which can help lower your total cholesterol. Nuts, seeds and legumes: 4 to 5 servings a week Almonds, sunflower seeds, kidney beans, peas, lentils and other foods in this family are good sources of magnesium, potassium and protein. They're also full of fiber and phytochemicals, which are plant compounds that may protect against some cancers and cardiovascular disease. Serving sizes are small and are intended to be consumed only a few times a week because these foods are high in calories. Examples of one serving include 1/3 cup nuts, 2 tablespoons seeds, or 1/2 cup cooked beans or peas.  Nuts sometimes get a bad rap because of their fat content, but they contain healthy types of fat - monounsaturated fat and omega-3 fatty acids. They're high in calories, however, so eat them in moderation. Try adding them to stir-fries, salads or cereals.  Soybean-based products, such as tofu and tempeh, can be a good alternative to meat because they contain all of the amino acids your body needs to make a complete protein, just like meat. Fats and oils: 2 to 3 servings a day Fat helps your body absorb essential vitamins and helps your body's immune system. But too much fat increases your risk of heart disease, diabetes and obesity. The DASH diet strives for a healthy balance by limiting total fat to less than 30 percent of daily calories from fat, with a focus on the healthier monounsaturated fats. Examples of one serving include 1 teaspoon soft margarine, 1 tablespoon mayonnaise or 2 tablespoons salad dressing. Saturated fat and trans fat are the main dietary culprits in increasing your risk of coronary artery disease. DASH helps keep your daily saturated fat to less than 6 percent of your total calories by limiting use of meat,  butter, cheese, whole milk, cream and eggs in your diet, along with foods made from lard, solid shortenings, and palm and coconut oils.  Avoid trans fat, commonly found in such processed foods as crackers, baked goods and fried items.  Read food labels on margarine and salad dressing so that you can choose those that are lowest in saturated fat and free of trans fat. Sweets: 5 servings or fewer a week You don't have to banish sweets entirely while following the DASH diet - just go easy on them. Examples of one serving include 1 tablespoon  sugar, jelly or jam, 1/2 cup sorbet, or 1 cup lemonade. When you eat sweets, choose those that are fat-free or low-fat, such as sorbets, fruit ices, jelly beans, hard candy, graham crackers or low-fat cookies.  Artificial sweeteners such as aspartame (NutraSweet, Equal) and sucralose (Splenda) may help satisfy your sweet tooth while sparing the sugar. But remember that you still must use them sensibly. It's OK to swap a diet cola for a regular cola, but not in place of a more nutritious beverage such as low-fat milk or even plain water.  Cut back on added sugar, which has no nutritional value but can pack on calories. DASH diet: Alcohol and caffeine Drinking too much alcohol can increase blood pressure. The Dietary Guidelines for Americans recommends that men limit alcohol to no more than two drinks a day and women to one or less. The DASH diet doesn't address caffeine consumption. The influence of caffeine on blood pressure remains unclear. But caffeine can cause your blood pressure to rise at least temporarily. If you already have high blood pressure or if you think caffeine is affecting your blood pressure, talk to your doctor about your caffeine consumption. DASH diet and weight loss While the DASH diet is not a weight-loss program, you may indeed lose unwanted pounds because it can help guide you toward healthier food choices. The DASH diet generally includes  about 2,000 calories a day. If you're trying to lose weight, you may need to eat fewer calories. You may also need to adjust your serving goals based on your individual circumstances - something your health care team can help you decide. Tips to cut back on sodium The foods at the core of the DASH diet are naturally low in sodium. So just by following the DASH diet, you're likely to reduce your sodium intake. You also reduce sodium further by: Using sodium-free spices or flavorings with your food instead of salt  Not adding salt when cooking rice, pasta or hot cereal  Rinsing canned foods to remove some of the sodium  Buying foods labeled "no salt added," "sodium-free," "low sodium" or "very low sodium" One teaspoon of table salt has 2,325 mg of sodium. When you read food labels, you may be surprised at just how much sodium some processed foods contain. Even low-fat soups, canned vegetables, ready-to-eat cereals and sliced [place] from the local deli - foods you may have considered healthy - often have lots of sodium. You may notice a difference in taste when you choose low-sodium food and beverages. If things seem too bland, gradually introduce low-sodium foods and cut back on table salt until you reach your sodium goal. That'll give your palate time to adjust. Using salt-free seasoning blends or herbs and spices may also ease the transition. It can take several weeks for your taste buds to get used to less salty foods. Putting the pieces of the DASH diet together Try these strategies to get started on the DASH diet:  Change gradually. If you now eat only one or two servings of fruits or vegetables a day, try to add a serving at lunch and one at dinner. Rather than switching to all whole grains, start by making one or two of your grain servings whole grains. Increasing fruits, vegetables and whole grains gradually can also help prevent bloating or diarrhea that may occur if you aren't used to eating a  diet with lots of fiber. You can also try over-the-counter products to help reduce gas from beans and vegetables.  Reward successes and forgive slip-ups. Reward yourself with a nonfood treat for your accomplishments - rent a movie, purchase a book or get together with a friend. Everyone slips, especially when learning something new. Remember that changing your lifestyle is a long-term process. Find out what triggered your setback and then just pick up where you left off with the DASH diet.  Add physical activity. To boost your blood pressure lowering efforts even more, consider increasing your physical activity in addition to following the DASH diet. Combining both the DASH diet and physical activity makes it more likely that you'll reduce your blood pressure.  Get support if you need it. If you're having trouble sticking to your diet, talk to your doctor or dietitian about it. You might get some tips that will help you stick to the DASH diet. Remember, healthy eating isn't an all-or-nothing proposition. What's most important is that, on average, you eat healthier foods with plenty of variety - both to keep your diet nutritious and to avoid boredom or extremes. And with the DASH diet, you can have both.    Hydrochlorothiazide, HCTZ; Telmisartan tablets What is this medicine? TELMISARTAN; HYDROCHLOROTHIAZIDE (tel mi SAR tan; hye droe klor oh THYE a zide) is a combination of a drug that relaxes blood vessels and a diuretic. It is used to treat high blood pressure. This medicine may be used for other purposes; ask your health care provider or pharmacist if you have questions. COMMON BRAND NAME(S): Micardis HCT What should I tell my health care provider before I take this medicine? They need to know if you have any of these conditions: -decreased urine -if you are on a special diet, like a low-salt diet -immune system problems, like lupus -kidney disease -liver disease -an unusual or allergic  reaction to telmisartan, hydrochlorothiazide, sulfa drugs, other medicines, foods, dyes, or preservatives -pregnant or trying to get pregnant -breast-feeding How should I use this medicine? Take this medicine by mouth with a drink of water. Follow the directions on the prescription label. You can take it with or without food. If it upsets your stomach, take it with food. Take your medicine at regular intervals. Do not take it more often than directed. Do not stop taking except on your doctor's advice. Talk to your pediatrician regarding the use of this medicine in children. Special care may be needed. Overdosage: If you think you have taken too much of this medicine contact a poison control center or emergency room at once. NOTE: This medicine is only for you. Do not share this medicine with others. What if I miss a dose? If you miss a dose, take it as soon as you can. If it is almost time for your next dose, take only that dose. Do not take double or extra doses. What may interact with this medicine? -barbiturates, like phenobarbital -blood pressure medicines -corticosteroids -diabetic medicines -digoxin -diuretics, especially triamterene, spironolactone or amiloride -lithium -NSAIDs, medicines for pain and inflammation, like ibuprofen or naproxen -potassium salts or potassium supplements -prescription pain medicines -skeletal muscle relaxants like tubocurarine -some cholesterol lowering medicines like cholestyramine or colestipol -warfarin This list may not describe all possible interactions. Give your health care provider a list of all the medicines, herbs, non-prescription drugs, or dietary supplements you use. Also tell them if you smoke, drink alcohol, or use illegal drugs. Some items may interact with your medicine. What should I watch for while using this medicine? Check your blood pressure regularly while you are taking this medicine.  Ask your doctor or health care professional  what your blood pressure should be and when you should contact him or her. When you check your blood pressure, write down the measurements to show your doctor or health care professional. If you are taking this medicine for a long time, you must visit your health care professional for regular checks on your progress. Make sure you schedule appointments on a regular basis. You must not get dehydrated. Ask your doctor or health care professional how much fluid you need to drink a day. Check with him or her if you get an attack of severe diarrhea, nausea and vomiting, or if you sweat a lot. The loss of too much body fluid can make it dangerous for you to take this medicine. Women should inform their doctor if they wish to become pregnant or think they might be pregnant. There is a potential for serious side effects to an unborn child, particularly in the second or third trimester. Talk to your health care professional or pharmacist for more information. You may get drowsy or dizzy. Do not drive, use machinery, or do anything that needs mental alertness until you know how this drug affects you. Do not stand or sit up quickly, especially if you are an older patient. This reduces the risk of dizzy or fainting spells. Alcohol can make you more drowsy and dizzy. Avoid alcoholic drinks. This medicine may affect your blood sugar level. If you have diabetes, check with your doctor or health care professional before changing the dose of your diabetic medicine. Avoid salt substitutes unless you are told otherwise by your doctor or health care professional. Do not treat yourself for coughs, colds, or pain while you are taking this medicine without asking your doctor or health care professional for advice. Some ingredients may increase your blood pressure. This medicine can make you more sensitive to the sun. Keep out of the sun. If you cannot avoid being in the sun, wear protective clothing and use sunscreen. Do not use  sun lamps or tanning beds/booths. What side effects may I notice from receiving this medicine? Side effects that you should report to your doctor or health care professional as soon as possible: -allergic reactions like skin rash, itching or hives, swelling of the face, lips, or tongue -breathing problems -changes in vision -dark urine -eye pain -fast or irregular heart beat, palpitations, or chest pain -feeling faint or lightheaded -muscle cramps -persistent dry cough -redness, blistering, peeling or loosening of the skin, including inside the mouth -stomach pain -trouble passing urine or change in the amount of urine -unusual bleeding or bruising -worsened gout pain -yellowing of the eyes or skin Side effects that usually do not require medical attention (report to your doctor or health care professional if they continue or are bothersome): -change in sex drive or performance -headache This list may not describe all possible side effects. Call your doctor for medical advice about side effects. You may report side effects to FDA at 1-800-FDA-1088. Where should I keep my medicine? Keep out of the reach of children. Store at room temperature between 15 and 30 degrees C (59 and 86 degrees F). Tablets should not be removed from the blisters until right before use. Throw away any unused medicine after the expiration date. NOTE: This sheet is a summary. It may not cover all possible information. If you have questions about this medicine, talk to your doctor, pharmacist, or health care provider.  2019 [employer] Standard (2009-12-24 14:52:10)

## 2018-04-19 LAB — BASIC METABOLIC PANEL
BUN / CREAT RATIO: 25 (ref 12–28)
BUN: 21 mg/dL (ref 8–27)
CO2: 27 mmol/L (ref 20–29)
CREATININE: 0.84 mg/dL (ref 0.57–1.00)
Calcium: 9.8 mg/dL (ref 8.7–10.3)
Chloride: 101 mmol/L (ref 96–106)
GFR calc non Af Amer: 63 mL/min/{1.73_m2} (ref >59)
GFR, EST AFRICAN AMERICAN: 73 mL/min/{1.73_m2} (ref >59)
GLUCOSE: 107 mg/dL — AB (ref 65–99)
Potassium: 4.7 mmol/L (ref 3.5–5.2)
SODIUM: 140 mmol/L (ref 134–144)

## 2018-04-19 LAB — PRO B NATRIURETIC PEPTIDE: NT-PRO BNP: 151 pg/mL (ref 0–738)

## 2018-04-20 ENCOUNTER — Ambulatory Visit (HOSPITAL_BASED_OUTPATIENT_CLINIC_OR_DEPARTMENT_OTHER)
Admission: RE | Admit: 2018-04-20 | Discharge: 2018-04-20 | Disposition: A | Discharge status: Home / Self Care | Payer: Medicare Other | Source: Ambulatory Visit | Attending: Medical | Admitting: Medical

## 2018-04-20 ENCOUNTER — Encounter: Payer: Self-pay | Admitting: Medical

## 2018-04-20 ENCOUNTER — Ambulatory Visit: Payer: Medicare Other | Admitting: Medical

## 2018-04-20 VITALS — BP 140/80 | HR 62 | Temp 97.7°F | Ht 60.0 in | Wt 163.6 lb

## 2018-04-20 DIAGNOSIS — M542 Cervicalgia: Secondary | ICD-10-CM

## 2018-04-20 MED ORDER — KETOROLAC TROMETHAMINE 30 MG/ML IJ SOLN
30.0000 mg | Freq: Once | INTRAMUSCULAR | Status: AC
  Administered 2018-04-20: 30 mg via INTRAMUSCULAR

## 2018-04-20 MED ORDER — TIZANIDINE HCL 4 MG PO CAPS: ORAL_CAPSULE | ORAL | 0 refills | Status: DC

## 2018-04-20 NOTE — Progress Notes (Signed)
Subjective:    Patient ID: [name], female    DOB: Feb 29, 1930, 85 y.o.   MRN: [id number]  HPI  Pt in with some neck pain about 10 days after fall on Thanksgiving day. She states she fell backward and hit back of her head. No neck pain, no  loc, no ha, no blurred vision at time of accident.  Neck region pain came on 10 days later.    Pt does mention she got botox injection on Dec 11,2019. Pt neck pain was before botox per pt description.   Review of Systems  Constitutional: Negative for chills, fatigue and fever.  Respiratory: Negative for cough, chest tightness, shortness of breath and wheezing.   Cardiovascular: Negative for chest pain and palpitations.  Gastrointestinal: Negative for abdominal pain.  Genitourinary: Negative for dyspareunia and flank pain.  Musculoskeletal: Positive for neck pain. Negative for neck stiffness.  Neurological: Negative for dizziness, seizures, syncope, weakness and headaches.  Hematological: Negative for adenopathy. Does not bruise/bleed easily.  Psychiatric/Behavioral: Negative for behavioral problems and confusion.    Past Medical History:  Diagnosis Date  . Anxiety and depression   . Back pain    lumbar  . Fecal incontinence    Dr. Michail Sermon, likely due to loose stools in conjunction with pelvic muscle laxity from aging, recommended to stop Miralax  . GI (gastrointestinal bleed)    hx of colon polyps 2004, diverticulosis, internal hemmorhids  . Headache   . Headache(784.0)   . Hydronephrosis    Right 2008 s/p urology eval CT/cysto: observation/monitor  . Hyperlipidemia   . Hypertension   . Knee pain    right chronic  . Migraine    on topamax , per neuro  . Osteoporosis   . Pulmonary nodules    [place]ert. First seen by CT only 05/04/03, no change on f/u 08/08/06 -Macroscopic changes rml 04/27/07 > resolved april 6,2010 no further w/u   . Seborrheic keratosis    Left inferior posterior flank  . SVT (supraventricular tachycardia)  ([place])    sees Dr.Ross     Social History   Socioeconomic History  . Marital status: Widowed    Spouse name: Not on file  . Number of children: 1  . Years of education: college   . Highest education level: Doctorate  Occupational History  . Occupation: retired   Scientific laboratory technician  . Financial resource strain: Not on file  . Food insecurity:    Worry: Not on file    Inability: Not on file  . Transportation needs:    Medical: Not on file    Non-medical: Not on file  Tobacco Use  . Smoking status: Never Smoker  . Smokeless tobacco: Never Used  Substance and Sexual Activity  . Alcohol use: Yes    Alcohol/week: 1.0 standard drinks    Types: 1 Glasses of wine per week    Comment: rarely, 1 glass of wine monthly  . Drug use: No  . Sexual activity: Not Currently  Lifestyle  . Physical activity:    Days per week: Not on file    Minutes per session: Not on file  . Stress: Not on file  Relationships  . Social connections:    Talks on phone: Not on file    Gets together: Not on file    Attends religious service: Not on file    Active member of club or organization: Not on file    Attends meetings of clubs or organizations: Not on file  Relationship status: Not on file  . Intimate partner violence:    Fear of current or ex partner: Not on file    Emotionally abused: Not on file    Physically abused: Not on file    Forced sexual activity: Not on file  Other Topics Concern  . Not on file  Social History Narrative   Moved from a retirement home to a condominium 04-2013, independent living,  lives by herself , drives    Son lives in [place]   Right handed    1 cup of caffeine per day     Past Surgical History:  Procedure Laterality Date  . ABDOMINAL HYSTERECTOMY    . APPENDECTOMY    . BASAL CELL CARCINOMA EXCISION    . BLADDER REPAIR    . CATARACT EXTRACTION  2008   Dr.Digby  . CHOLECYSTECTOMY    . lumbar reconstructive surgery  01/07/2009  . R ovary removed    .  ROTATOR CUFF REPAIR  9/-2011  . SHOULDER SURGERY  12-2011   LEFT  . TONSILLECTOMY    . TOTAL KNEE ARTHROPLASTY     right  . trigger finger surgery     L thumb (2014), R ring finger 05-2013    Family History  Problem Relation Age of Onset  . Breast cancer Mother        later in life  . Diabetes Mother        late in life  . Heart disease Mother        late onset  . Lung cancer Father        apparently had TB  . Heart disease Father        late onset  . Prostate cancer Father   . Colitis Brother   . Cancer Brother   . Colon cancer Neg Hx     Allergies  Allergen Reactions  . Amitriptyline Hcl     REACTION: hallucinations  . Carisoprodol     REACTION: nightmares,rash,tachycardia  . Codeine     REACTION: "deathly ill" nauesa, dizzy,weak  . Fosamax [Alendronate Sodium] Other (See Comments)    Dental issues  . Meloxicam     REACTION: sensitive to sun; facial redness; may cause severe abd pain  . Metronidazole     REACTION: abd pain  . Nortriptyline Hcl     REACTION: hallucinations  . Oxaprozin     REACTION: abd pain  . Promethazine Hcl     REACTION: spastic arms and legs flailing while awake and unusual behavior while sleep - walking all over house during night  . Rofecoxib     REACTION: rash  . Sumatriptan     REACTION: abdominal pain, blurry vision, drowsiness  . Tramadol Other (See Comments)    Dizziness and constipation  . Venlafaxine     Hallucinations     Current Outpatient Medications on File Prior to Visit  Medication Sig Dispense Refill  . acetaminophen (TYLENOL) 500 MG tablet Take 500 mg by mouth every 6 (six) hours as needed for moderate pain.    Marland Kitchen amoxicillin-clavulanate (AUGMENTIN) 875-125 MG tablet Prior to dental appts    . atorvastatin (LIPITOR) 40 MG tablet Take 1 tablet (40 mg total) by mouth at bedtime. 90 tablet 2  . butalbital-acetaminophen-caffeine (FIORICET, ESGIC) 50-325-40 MG tablet Take 1 tablet by mouth every 6 (six) hours as needed  for headache. Do not refill in less than one month, cancel previous Rx 12 tablet 5  . celecoxib (CELEBREX) 200  MG capsule Take 200 mg by mouth daily.    Marland Kitchen CRANBERRY PO Take 1 capsule by mouth daily.    Marland Kitchen gabapentin (NEURONTIN) 100 MG capsule Take 2-3 capsules (200-300 mg total) by mouth 3 (three) times daily. 810 capsule 1  . multivitamin (THERAGRAN) per tablet Take 1 tablet by mouth daily.      . NON FORMULARY Take 2 capsules by mouth 2 (two) times daily. [employer]	[place]    . Probiotic Product (PROBIOTIC DAILY PO) Take 1 capsule by mouth daily.     . propranolol ER (INDERAL LA) 60 MG 24 hr capsule Take 1 capsule (60 mg total) by mouth daily. 30 capsule 11  . telmisartan-hydrochlorothiazide (MICARDIS HCT) 40-12.5 MG tablet Take 1 tablet by mouth daily. 30 tablet 3  . trimethoprim (TRIMPEX) 100 MG tablet Take 100 mg by mouth daily.  11  . venlafaxine (EFFEXOR) 25 MG tablet TAKE 1 TABLET BY MOUTH EVERYDAY AT BEDTIME 90 tablet 4   No current facility-administered medications on file prior to visit.     BP (!) 168/72   Pulse 62   Temp 97.7 F (36.5 C) (Oral)   Ht 5' (1.524 m)   Wt 163 lb 9.6 oz (74.2 kg)   HC 4" (10.2 cm)   SpO2 97%   BMI 31.95 kg/m       Objective:   Physical Exam   General Mental Status- Alert. General Appearance- Not in acute distress.   Skin General: Color- Normal Color. Moisture- Normal Moisture.  Neck Carotid Arteries- Normal color. Moisture- Normal Moisture. No carotid bruits. No JVD. Rt side of neck/trapezius area. Mild tender where trap inserts into occipital area.  Chest and Lung Exam Auscultation: Breath Sounds:-Normal.  Cardiovascular Auscultation:Rythm- Regular. Murmurs & Other Heart Sounds:Auscultation of the heart reveals- No Murmurs.  Abdomen Inspection:-Inspeection Normal. Palpation/Percussion:Note:No mass. Palpation and Percussion of the abdomen reveal- Non Tender, Non Distended + BS, no rebound or guarding.   Neurologic Cranial Nerve  exam:- CN III-XII intact(No nystagmus), symmetric smile. Drift Test:- No drift. Heal to Toe Gait exam:-Normal. Finger to Nose:- Normal/Intact Strength:- 5/5 equal and symmetric strength both upper and lower extremities.  Rt upper eyelid. Mild droop(but known since last botox). Evaluated by eye MD who stated botox side effect.)  Cranium- no obvious deformity or tenderness.    Assessment & Plan:  You do have pain in the area where the trapezius meets occipital area.  But based on described fall I do think is a good idea to go ahead and get neck/cervical spine x-ray.  We gave you Toradol 30 mg injection in office.  Not to use any Celebrex until tomorrow afternoon.  Making low-dose Zanaflex available to use over the next 5 nights.  Hopefully this will relax your neck muscles.  You had the injury more than a month ago so I do not think CT imaging is needed.  Also you had very good neurologic exam today.  Consider referral to sports medicine if your neck pain does not get better with above treatment.  Follow-up in 7 to 10 days or as needed.  Mackie Pai, PA-C

## 2018-04-20 NOTE — Patient Instructions (Addendum)
You do have pain in the area where the trapezius meets occipital area.  But based on described fall I do think is a good idea to go ahead and get neck/cervical spine x-ray.  We gave you Toradol 30 mg injection in office.  Not to  use any Celebrex until tomorrow afternoon.  Making low-dose Zanaflex available to use over the next 5 nights.  Hopefully this will relax your neck muscles.  You had the injury more than a month ago so I do not think CT imaging is needed.  Also you had very good neurologic exam today.  Consider referral to sports medicine if your neck pain does not get better with above treatment.  Follow-up in 7 to 10 days or as needed.

## 2018-04-21 ENCOUNTER — Ambulatory Visit (HOSPITAL_BASED_OUTPATIENT_CLINIC_OR_DEPARTMENT_OTHER): Admission: RE | Admit: 2018-04-21 | Payer: Medicare Other | Source: Ambulatory Visit

## 2018-04-22 ENCOUNTER — Telehealth: Payer: Self-pay | Admitting: Medical

## 2018-04-22 DIAGNOSIS — M542 Cervicalgia: Secondary | ICD-10-CM

## 2018-04-22 NOTE — Telephone Encounter (Signed)
Referral to sports med placed.

## 2018-05-02 ENCOUNTER — Ambulatory Visit (HOSPITAL_BASED_OUTPATIENT_CLINIC_OR_DEPARTMENT_OTHER)
Admission: RE | Admit: 2018-05-02 | Discharge: 2018-05-02 | Disposition: A | Discharge status: Home / Self Care | Payer: Medicare Other | Source: Ambulatory Visit | Attending: Cardiology | Admitting: Cardiology

## 2018-05-02 DIAGNOSIS — R600 Localized edema: Secondary | ICD-10-CM

## 2018-05-02 NOTE — Progress Notes (Signed)
  Echocardiogram 2D Echocardiogram has been performed.  [name] T Tari Lecount 05/02/2018, 10:34 AM

## 2018-05-04 ENCOUNTER — Other Ambulatory Visit: Payer: Self-pay | Admitting: Neurology

## 2018-05-04 ENCOUNTER — Encounter: Payer: Self-pay | Admitting: Family Medicine

## 2018-05-04 ENCOUNTER — Ambulatory Visit: Payer: Medicare Other | Admitting: Family Medicine

## 2018-05-04 VITALS — BP 156/91 | Ht 64.5 in | Wt 160.0 lb

## 2018-05-04 DIAGNOSIS — M542 Cervicalgia: Secondary | ICD-10-CM

## 2018-05-04 NOTE — Patient Instructions (Addendum)
You have trapezius, cervical strains. Continue your celebrex as you have been. Start physical therapy and do home exercises/stretches on days you don't go to therapy. Tylenol 500mg  1-2 tabs three times a day if needed. Topical capsaicin or biofreeze up to 4 times a day may be helpful. Heat 15 minutes at a time 3-4 times a day as needed. Follow up with me in 1 month.

## 2018-05-07 ENCOUNTER — Encounter: Payer: Self-pay | Admitting: Family Medicine

## 2018-05-07 NOTE — Progress Notes (Signed)
PCP: Colon Branch, MD  Subjective:   HPI: Patient is a 85 y.o. female here for neck pain.  Patient reports on Thanksgiving 2019 she fell backwards and hit her head on a doorway. Did not lose consciousness. States did well initially and this took about a week before she developed pain bilateral neck and upper back. Radiates only to both shoulders posteriorly. Pain is sharp. Tried cervical collar but not the right size per patient. No numbness, tingling. No skin changes. No bowel/bladder dysfunction. Tried toradol, celebrex, zanaflex.  Past Medical History:  Diagnosis Date  . Anxiety and depression   . Back pain    lumbar  . Fecal incontinence    Dr. Michail Sermon, likely due to loose stools in conjunction with pelvic muscle laxity from aging, recommended to stop Miralax  . GI (gastrointestinal bleed)    hx of colon polyps 2004, diverticulosis, internal hemmorhids  . Headache   . Headache(784.0)   . Hydronephrosis    Right 2008 s/p urology eval CT/cysto: observation/monitor  . Hyperlipidemia   . Hypertension   . Knee pain    right chronic  . Migraine    on topamax , per neuro  . Osteoporosis   . Pulmonary nodules    [place]. First seen by CT only 05/04/03, no change on f/u 08/08/06 -Macroscopic changes rml 04/27/07 > resolved april 6,2010 no further w/u   . Seborrheic keratosis    Left inferior posterior flank  . SVT (supraventricular tachycardia) ([place])    sees Dr.Ross    Current Outpatient Medications on File Prior to Visit  Medication Sig Dispense Refill  . acetaminophen (TYLENOL) 500 MG tablet Take 500 mg by mouth every 6 (six) hours as needed for moderate pain.    Marland Kitchen amoxicillin-clavulanate (AUGMENTIN) 875-125 MG tablet Prior to dental appts    . atorvastatin (LIPITOR) 40 MG tablet Take 1 tablet (40 mg total) by mouth at bedtime. 90 tablet 2  . butalbital-acetaminophen-caffeine (FIORICET, ESGIC) 50-325-40 MG tablet Take 1 tablet by mouth every 6 (six) hours as needed for  headache. Do not refill in less than one month, cancel previous Rx 12 tablet 5  . celecoxib (CELEBREX) 200 MG capsule Take 200 mg by mouth daily.    Marland Kitchen CRANBERRY PO Take 1 capsule by mouth daily.    Marland Kitchen gabapentin (NEURONTIN) 100 MG capsule Take 2-3 capsules (200-300 mg total) by mouth 3 (three) times daily. 810 capsule 1  . multivitamin (THERAGRAN) per tablet Take 1 tablet by mouth daily.      . NON FORMULARY Take 2 capsules by mouth 2 (two) times daily. [employer]	[place]    . Probiotic Product (PROBIOTIC DAILY PO) Take 1 capsule by mouth daily.     . propranolol ER (INDERAL LA) 60 MG 24 hr capsule Take 1 capsule (60 mg total) by mouth daily. 30 capsule 11  . telmisartan-hydrochlorothiazide (MICARDIS HCT) 40-12.5 MG tablet Take 1 tablet by mouth daily. 30 tablet 3  . tiZANidine (ZANAFLEX) 4 MG capsule 1 tablet p.o. nightly 5 capsule 0  . trimethoprim (TRIMPEX) 100 MG tablet Take 100 mg by mouth daily.  11  . venlafaxine (EFFEXOR) 25 MG tablet TAKE 1 TABLET BY MOUTH EVERYDAY AT BEDTIME 90 tablet 4   No current facility-administered medications on file prior to visit.     Past Surgical History:  Procedure Laterality Date  . ABDOMINAL HYSTERECTOMY    . APPENDECTOMY    . BASAL CELL CARCINOMA EXCISION    . BLADDER REPAIR    .  CATARACT EXTRACTION  2008   Dr.Digby  . CHOLECYSTECTOMY    . lumbar reconstructive surgery  01/07/2009  . R ovary removed    . ROTATOR CUFF REPAIR  9/-2011  . SHOULDER SURGERY  12-2011   LEFT  . TONSILLECTOMY    . TOTAL KNEE ARTHROPLASTY     right  . trigger finger surgery     L thumb (2014), R ring finger 05-2013    Allergies  Allergen Reactions  . Amitriptyline Hcl     REACTION: hallucinations  . Carisoprodol     REACTION: nightmares,rash,tachycardia  . Codeine     REACTION: "deathly ill" nauesa, dizzy,weak  . Fosamax [Alendronate Sodium] Other (See Comments)    Dental issues  . Meloxicam     REACTION: sensitive to sun; facial redness; may cause severe abd pain   . Metronidazole     REACTION: abd pain  . Nortriptyline Hcl     REACTION: hallucinations  . Oxaprozin     REACTION: abd pain  . Promethazine Hcl     REACTION: spastic arms and legs flailing while awake and unusual behavior while sleep - walking all over house during night  . Rofecoxib     REACTION: rash  . Sumatriptan     REACTION: abdominal pain, blurry vision, drowsiness  . Tramadol Other (See Comments)    Dizziness and constipation  . Venlafaxine     Hallucinations     Social History   Socioeconomic History  . Marital status: Widowed    Spouse name: Not on file  . Number of children: 1  . Years of education: college   . Highest education level: Doctorate  Occupational History  . Occupation: retired   Scientific laboratory technician  . Financial resource strain: Not on file  . Food insecurity:    Worry: Not on file    Inability: Not on file  . Transportation needs:    Medical: Not on file    Non-medical: Not on file  Tobacco Use  . Smoking status: Never Smoker  . Smokeless tobacco: Never Used  Substance and Sexual Activity  . Alcohol use: Yes    Alcohol/week: 1.0 standard drinks    Types: 1 Glasses of wine per week    Comment: rarely, 1 glass of wine monthly  . Drug use: No  . Sexual activity: Not Currently  Lifestyle  . Physical activity:    Days per week: Not on file    Minutes per session: Not on file  . Stress: Not on file  Relationships  . Social connections:    Talks on phone: Not on file    Gets together: Not on file    Attends religious service: Not on file    Active member of club or organization: Not on file    Attends meetings of clubs or organizations: Not on file    Relationship status: Not on file  . Intimate partner violence:    Fear of current or ex partner: Not on file    Emotionally abused: Not on file    Physically abused: Not on file    Forced sexual activity: Not on file  Other Topics Concern  . Not on file  Social History Narrative   Moved  from a retirement home to a condominium 04-2013, independent living,  lives by herself , drives    Son lives in [place]   Right handed    1 cup of caffeine per day     Family History  Problem  Relation Age of Onset  . Breast cancer Mother        later in life  . Diabetes Mother        late in life  . Heart disease Mother        late onset  . Lung cancer Father        apparently had TB  . Heart disease Father        late onset  . Prostate cancer Father   . Colitis Brother   . Cancer Brother   . Colon cancer Neg Hx     BP (!) 156/91   Ht 5' 4.5" (1.638 m)   Wt 160 lb (72.6 kg)   BMI 27.04 kg/m   Review of Systems: See HPI above.     Objective:  Physical Exam:  Gen: NAD, comfortable in exam room  Neck: No gross deformity, swelling, bruising. TTP bilateral cervical paraspinal regions, upper trapezius muscles.  No midline/bony TTP. ROM 10 degrees extension, 20 flexion, 20 bilateral rotations. BUE strength 5/5.   Sensation intact to light touch.   2+ equal reflexes in triceps, biceps, brachioradialis tendons. NV intact distal BUEs.  Bilateral shoulders: No swelling, ecchymoses.  No gross deformity. No TTP. FROM. Strength 5/5 with empty can and resisted internal/external rotation. NV intact distally.   Assessment & Plan:  1. Neck pain - independently reviewed radiographs and no evidence fracture, acute subluxation, other concerning bony findings; underlying degenerative changes as expected.  Consistent with cervical strain, trapezius strain.  Start physical therapy.  Tylenol, celebrex.  Heat for spasms.  Topical medications also reviewed.  F/u in 1 month.

## 2018-05-08 NOTE — Telephone Encounter (Addendum)
Patient arrived to the office, on 04/13/18, for a physical evaluation.  See Dr. Rhea Belton note below.    04/13/18 5:15 PM  Note    She had a Botox injection as migraine prevention on March 29, 2018, a week afterwards, she noticed right upper eyelid drooping, intermittent, sometimes worse than the other, do block her right vision sometimes,  on examination, she has fatigable right ptosis, mild right eye closure weakness, there was no significant bulbar muscle weakness otherwise, findings are most consistent with side effect from Botox injection, hope to peaking 2 to 3 weeks, and eventually recover to baseline

## 2018-05-08 NOTE — Telephone Encounter (Signed)
I spoke to the patient today and she provided an update.  Since she was last seen, her right eye ptosis has started improving.  She verbalized understanding that this symptom gradually resolves with time.

## 2018-05-08 NOTE — Telephone Encounter (Signed)
Pt is asking for a call to discuss that her right eyelid is now back up, below pupil.  Pt is asking for a call to discuss

## 2018-05-09 ENCOUNTER — Other Ambulatory Visit: Payer: Self-pay

## 2018-05-09 ENCOUNTER — Ambulatory Visit: Payer: Medicare Other | Attending: Family Medicine | Admitting: Physical Therapy

## 2018-05-09 DIAGNOSIS — M6281 Muscle weakness (generalized): Secondary | ICD-10-CM | POA: Diagnosis present

## 2018-05-09 DIAGNOSIS — R293 Abnormal posture: Secondary | ICD-10-CM | POA: Diagnosis present

## 2018-05-09 DIAGNOSIS — M542 Cervicalgia: Secondary | ICD-10-CM | POA: Insufficient documentation

## 2018-05-09 NOTE — Therapy (Signed)
[facility] [address]  [place], [place], [address] Phone: [phone]   Fax:  [phone]  Physical Therapy Evaluation  Patient Details  Name: [name] MRN: [id number] Date of Birth: 1930/02/01 Referring Provider (PT): Karlton Lemon, MD   Encounter Date: 05/09/2018  PT End of Session - 05/09/18 1438    Visit Number  1    Number of Visits  12    Date for PT Re-Evaluation  06/20/18    Authorization Type  [employer] Medicare    PT Start Time  5427    PT Stop Time  1540    PT Time Calculation (min)  62 min       Past Medical History:  Diagnosis Date  . Anxiety and depression   . Back pain    lumbar  . Fecal incontinence    Dr. Michail Sermon, likely due to loose stools in conjunction with pelvic muscle laxity from aging, recommended to stop Miralax  . GI (gastrointestinal bleed)    hx of colon polyps 2004, diverticulosis, internal hemmorhids  . Headache   . Headache(784.0)   . Hydronephrosis    Right 2008 s/p urology eval CT/cysto: observation/monitor  . Hyperlipidemia   . Hypertension   . Knee pain    right chronic  . Migraine    on topamax , per neuro  . Osteoporosis   . Pulmonary nodules    Wert. First seen by CT only 05/04/03, no change on f/u 08/08/06 -Macroscopic changes rml 04/27/07 > resolved april 6,2010 no further w/u   . Seborrheic keratosis    Left inferior posterior flank  . SVT (supraventricular tachycardia) (HCC)    sees Dr.Ross    Past Surgical History:  Procedure Laterality Date  . ABDOMINAL HYSTERECTOMY    . APPENDECTOMY    . BASAL CELL CARCINOMA EXCISION    . BLADDER REPAIR    . CATARACT EXTRACTION  2008   Dr.Digby  . CHOLECYSTECTOMY    . lumbar reconstructive surgery  01/07/2009  . R ovary removed    . ROTATOR CUFF REPAIR  9/-2011  . SHOULDER SURGERY  12-2011   LEFT  . TONSILLECTOMY    . TOTAL KNEE ARTHROPLASTY     right  . trigger finger surgery     L thumb (2014), R ring  finger 05-2013    There were no vitals filed for this visit.   Subjective Assessment - 05/09/18 1442    Subjective  Pt reports she fell inher bathroom at Thanksgiving - was trying to place her recently washed bathroom rugs and wearing a medical boot on her L foot (for an unrelated fracture) and her foot slipped causing her fall backward hitting her head on the marble tile. No pain initially but a few days later started having severe pain in R side of her neck and back of her head. Now unable to hold her head up straight.    Pertinent History  R TKR; B RTC repair    Limitations  Standing;Walking;House hold activities    How long can you stand comfortably?  "not at all"    Diagnostic tests  Neck x-ray 04/20/18 - 1. No cervical spine fracture or acute malalignment; 2. Reversal of the normal cervical lordosis, usually due to positioning and/or muscle spasm; 3. Moderate multilevel cervical degenerative disc disease, most prominent at C2-3; 4. Mild degenerative foraminal stenosis on the right at C4-5.    Patient Stated Goals  "able to move  my head any way I want to w/o extra pain"    Currently in Pain?  Yes    Pain Score  8     Pain Location  Neck    Pain Orientation  Lower    Pain Descriptors / Indicators  Sharp;Aching    Pain Type  Acute pain    Pain Radiating Towards  up behind R ear and "down spinal column"    Pain Onset  More than a month ago    Pain Frequency  Constant    Aggravating Factors   lifting head up, leaning over or picking things up    Pain Relieving Factors  heating pad, ice    Effect of Pain on Daily Activities  difficulty holding head up, difficulty reaching up in to cabinets         [facility] PT Assessment - 05/09/18 1438      Assessment   Medical Diagnosis  B cervical & trapezius strain    Referring Provider (PT)  Karlton Lemon, MD    Onset Date/Surgical Date  03/16/18    Hand Dominance  Right    Next MD Visit  06/07/18    Prior Therapy  PT for balance last fall       Precautions   Precautions  Fall      Balance Screen   Has the patient fallen in the past 6 months  Yes    How many times?  3    Has the patient had a decrease in activity level because of a fear of falling?   Yes    Is the patient reluctant to leave their home because of a fear of falling?   No      Home Environment   Living Environment  Private residence    Living Arrangements  Alone    Type of [place] Access  Level entry    [place] - 2 wheels   uses RW in home & hurrycane style cane when going out     Prior Function   Level of [place]  Retired    Leisure  likes to [place], play bridge, read, scrapbooking, watch TV      Cognition   Overall Cognitive Status  Within Functional Limits for tasks assessed      Observation/Other Assessments   Focus on Therapeutic Outcomes (FOTO)   Neck - 37% (63% limitation); Predicted 50% (50% limitation)      Posture/Postural Control   Posture/Postural Control  Postural limitations    Postural Limitations  Forward head;Rounded Shoulders   flexed neck     ROM / Strength   AROM / PROM / Strength  AROM;Strength      AROM   Overall AROM Comments  B shoulder ROM WFL    AROM Assessment Site  Cervical;Shoulder    Cervical Flexion  50    Cervical Extension  15    Cervical - Right Side Bend  26    Cervical - Left Side Bend  14    Cervical - Right Rotation  39    Cervical - Left Rotation  58      Strength   Strength Assessment Site  Shoulder    Right/Left Shoulder  Right;Left    Right Shoulder Flexion  4-/5    Right Shoulder ABduction  4-/5    Right Shoulder  Internal Rotation  4+/5    Right Shoulder External Rotation  4/5    Left Shoulder Flexion  4-/5    Left Shoulder ABduction  4-/5    Left Shoulder Internal Rotation  4+/5    Left Shoulder External Rotation  4-/5      Palpation   Palpation comment  ttp over B UT, LS and lateral  pecs                Objective measurements completed on examination: See above findings.      [facility] Adult PT Treatment/Exercise - 05/09/18 1438      Exercises   Exercises  Neck      Neck Exercises: Seated   Other Seated Exercise  Cervical extension with towel support 10 x 3 sec    Other Seated Exercise  Scap retraction + depression 10 x 5 sec - cues to maintain neutral cervical spine      Neck Exercises: Supine   Neck Retraction  10 reps;5 secs    Neck Retraction Limitations  into thin pillow             PT Education - 05/09/18 1540    Education Details  PT eval findings, anticipated POC & initial HEP    Person(s) Educated  Patient    Methods  Explanation;Demonstration;Handout    Comprehension  Verbalized understanding;Returned demonstration;Need further instruction       PT Short Term Goals - 05/09/18 1540      PT SHORT TERM GOAL #1   Title  Independent with intial HEP    Status  New    Target Date  05/30/18      PT SHORT TERM GOAL #2   Title  Patient to demonstrate appropriate posture and body mechanics needed for daily activities    Status  New    Target Date  05/30/18        PT Long Term Goals - 05/09/18 1540      PT LONG TERM GOAL #1   Title  Independent with ongoing/advanced HEP    Status  New    Target Date  06/20/18      PT LONG TERM GOAL #2   Title  Patient to improve cervical AROM to [facility] without pain provocation     Status  New    Target Date  06/20/18      PT LONG TERM GOAL #3   Title  B shoulder strength >/= 4+/5 for improved postural stability    Status  New    Target Date  06/20/18      PT LONG TERM GOAL #4   Title  Patient to report ability to perform ADLs and daily household tasks without increased pain    Status  New    Target Date  06/20/18             Plan - 05/09/18 1540    Clinical Impression Statement  Fraser Din is an 85 y/o female who presents to OP PT for neck pain secondary to cervical & upper trapezius  strains due to a fall at Thanksgiving. Pt presents to PT with forward head and rounded shoulder posture with neck in sustained flexion with considerable effort necessary to achieve and maintain neutral cervical extension. Cervical ROM limited in all planes with greatest restriction in extension, however B shoulder ROM WNL. Mild strength deficit noted in B shoulders, esp with flexion & abduction. Pain and ttp present in B UT, LS and lateral pecs. Fraser Din will  benefit from skilled PT to address above deficits to restore functional neck posture and decrease pain interference with daily tasks.    History and Personal Factors relevant to plan of care:  h/o lumbar reconstructive surgery, R TKR, B RTC repairs, migraines with R eyelid ptosis from botox injection, recent R foot fracture, balance deficits s/p recent PT episode for balance    Clinical Presentation  Evolving    Clinical Presentation due to:  worsening pain and weakness in cervical musculature + complex medical history    Clinical Decision Making  Moderate    Rehab Potential  Good    PT Frequency  2x / week    PT Duration  6 weeks    PT Treatment/Interventions  Patient/family education;Neuromuscular re-education;Therapeutic exercise;Therapeutic activities;Functional mobility training;Electrical Stimulation;Moist Heat;Ultrasound;Iontophoresis 4mg /ml Dexamethasone;Manual techniques;Passive range of motion;Dry needling;Taping;ADLs/Self Care Home Management    PT Next Visit Plan  Review initial HEP; Postural education; Gentle ROM and stretching; Postural strengthening    Consulted and Agree with Plan of Care  Patient       Patient will benefit from skilled therapeutic intervention in order to improve the following deficits and impairments:  Pain, Impaired flexibility, Increased muscle spasms, Decreased range of motion, Decreased strength, Postural dysfunction, Improper body mechanics, Impaired UE functional use, Decreased activity tolerance  Visit  Diagnosis: Cervicalgia  Abnormal posture  Muscle weakness (generalized)     Problem List Patient Active Problem List   Diagnosis Date Noted  . Edema of both lower extremities 04/18/2018  . Chronic migraine 11/15/2017  . Memory loss 11/15/2017  . Osteoporosis 07/09/2017  . CKD (chronic kidney disease) stage 2, GFR 60-89 ml/min 01/06/2016  . Abdominal pain 09/10/2015  . Pulmonary nodules/lesions, multiple 09/10/2015  . Small bowel obstruction, partial ([place]) 09/10/2015  . Arthritis 09/10/2015  . Partial small bowel obstruction ([place]) 09/10/2015  . PCP NOTES >>>>>> 04/17/2015  . Hemorrhoids 09/17/2011  . Annual physical exam 03/19/2011  . DJD -back pain-on Celebrex   . Cough 09/25/2010  . Constipation 01/21/2010  . DIVERTICULOSIS OF COLON 09/02/2009  . PERSONAL HISTORY OF COLONIC POLYPS 09/02/2009  . FULL INCONTINENCE OF FECES 09/02/2009  . Essential hypertension 08/10/2007  . Headache 08/10/2007  . Pulmonary nodules 04/25/2007  . Anxiety state 02/03/2007  . Depression 02/03/2007  . KNEE PAIN, RIGHT, CHRONIC 02/03/2007  . Hyperlipidemia 04/26/2006  . DIZZINESS 04/26/2006  . SVT (supraventricular tachycardia) ([place]) 04/26/2006    [name], PT, MPT 05/09/2018, 7:21 PM  [facility] [address]  [address] [place], [place], [address] Phone: [phone]   Fax:  [phone]  Name: [name] MRN: [id number] Date of Birth: 1930/02/20

## 2018-05-11 NOTE — Progress Notes (Signed)
Cardiology Office Note:    Date:  05/12/2018   ID:  [name], DOB 05/19/1929, MRN [id number]  PCP:  Colon Branch, MD  Cardiologist:  Shirlee More, MD    Referring MD: Colon Branch, MD    ASSESSMENT:    1. Essential hypertension   2. SVT (supraventricular tachycardia) (HCC)    PLAN:    In order of problems listed above:  1. Stable blood pressure at target after transition from calcium channel blocker to ARB diuretic recheck renal function potassium today and provided within normal see back in the office as needed 2. Stable no recurrence off calcium channel blocker she will continue her long-acting nonselective lipophilic beta-blocker for migraine   Next appointment: As needed   Medication Adjustments/Labs and Tests Ordered: Current medicines are reviewed at length with the patient today.  Concerns regarding medicines are outlined above.  Orders Placed This Encounter  Procedures  . Basic metabolic panel   No orders of the defined types were placed in this encounter.   Chief Complaint  Patient presents with  . Hypertension    History of Present Illness:    [name] is a 85 y.o. female with a hx of hypertension last seen 04/18/18. Compliance with diet, lifestyle and medications: yes  Echo 05/02/18:   Study Conclusions - Left ventricle: The cavity size was normal. Wall thickness was   normal. Systolic function was normal. The estimated ejection   fraction was in the range of 55% to 60%. Wall motion was normal;   there were no regional wall motion abnormalities. Doppler   parameters are consistent with abnormal left ventricular   relaxation (grade 1 diastolic dysfunction). - Mitral valve: Mildly calcified annulus. There was mild   regurgitation.  She is pleased her ankles are no longer swollen blood pressure at home runs 120 130/70-80 no side effects from transition from calcium channel blocker ARB diuretic.  She prefers to see me back in the office  as needed she has a good relationship with her PCP and I will recheck BMP today with initiating RIS regarding potassium and creatinine if these are stable we will plan to see back as needed.  She has had no edema shortness of breath chest pain palpitation or syncope Past Medical History:  Diagnosis Date  . Anxiety and depression   . Back pain    lumbar  . Fecal incontinence    Dr. Michail Sermon, likely due to loose stools in conjunction with pelvic muscle laxity from aging, recommended to stop Miralax  . GI (gastrointestinal bleed)    hx of colon polyps 2004, diverticulosis, internal hemmorhids  . Headache   . Headache(784.0)   . Hydronephrosis    Right 2008 s/p urology eval CT/cysto: observation/monitor  . Hyperlipidemia   . Hypertension   . Knee pain    right chronic  . Migraine    on topamax , per neuro  . Osteoporosis   . Pulmonary nodules    [place]. First seen by CT only 05/04/03, no change on f/u 08/08/06 -Macroscopic changes rml 04/27/07 > resolved april 6,2010 no further w/u   . Seborrheic keratosis    Left inferior posterior flank  . SVT (supraventricular tachycardia) (HCC)    sees Dr.Ross    Past Surgical History:  Procedure Laterality Date  . ABDOMINAL HYSTERECTOMY    . APPENDECTOMY    . BASAL CELL CARCINOMA EXCISION    . BLADDER REPAIR    . CATARACT EXTRACTION  2008  Dr.Digby  . CHOLECYSTECTOMY    . lumbar reconstructive surgery  01/07/2009  . R ovary removed    . ROTATOR CUFF REPAIR  9/-2011  . SHOULDER SURGERY  12-2011   LEFT  . TONSILLECTOMY    . TOTAL KNEE ARTHROPLASTY     right  . trigger finger surgery     L thumb (2014), R ring finger 05-2013    Current Medications: Current Meds  Medication Sig  . acetaminophen (TYLENOL) 500 MG tablet Take 500 mg by mouth every 6 (six) hours as needed for moderate pain.  Marland Kitchen amoxicillin-clavulanate (AUGMENTIN) 875-125 MG tablet Prior to dental appts  . atorvastatin (LIPITOR) 40 MG tablet Take 1 tablet (40 mg total) by mouth  at bedtime.  . butalbital-acetaminophen-caffeine (FIORICET, ESGIC) 50-325-40 MG tablet Take 1 tablet by mouth every 6 (six) hours as needed for headache. Do not refill in less than one month, cancel previous Rx  . celecoxib (CELEBREX) 200 MG capsule Take 200 mg by mouth daily.  Marland Kitchen CRANBERRY PO Take 1 capsule by mouth daily.  Marland Kitchen gabapentin (NEURONTIN) 100 MG capsule Take 2-3 capsules (200-300 mg total) by mouth 3 (three) times daily.  . multivitamin (THERAGRAN) per tablet Take 1 tablet by mouth daily.    . NON FORMULARY Take 2 capsules by mouth 2 (two) times daily. [employer]	[place]  . Probiotic Product (PROBIOTIC DAILY PO) Take 1 capsule by mouth daily.   . propranolol ER (INDERAL LA) 60 MG 24 hr capsule Take 1 capsule (60 mg total) by mouth daily.  Marland Kitchen telmisartan-hydrochlorothiazide (MICARDIS HCT) 40-12.5 MG tablet Take 1 tablet by mouth daily.  Marland Kitchen trimethoprim (TRIMPEX) 100 MG tablet Take 100 mg by mouth daily.     Allergies:   Amitriptyline hcl; Carisoprodol; Codeine; Fosamax [alendronate sodium]; Meloxicam; Metronidazole; Nortriptyline hcl; Oxaprozin; Promethazine hcl; Rofecoxib; Sumatriptan; Tramadol; and Venlafaxine   Social History   Socioeconomic History  . Marital status: Widowed    Spouse name: Not on file  . Number of children: 1  . Years of education: college   . Highest education level: Doctorate  Occupational History  . Occupation: retired   Scientific laboratory technician  . Financial resource strain: Not on file  . Food insecurity:    Worry: Not on file    Inability: Not on file  . Transportation needs:    Medical: Not on file    Non-medical: Not on file  Tobacco Use  . Smoking status: Never Smoker  . Smokeless tobacco: Never Used  Substance and Sexual Activity  . Alcohol use: Yes    Alcohol/week: 1.0 standard drinks    Types: 1 Glasses of wine per week    Comment: rarely, 1 glass of wine monthly  . Drug use: No  . Sexual activity: Not Currently  Lifestyle  . Physical activity:    Days  per week: Not on file    Minutes per session: Not on file  . Stress: Not on file  Relationships  . Social connections:    Talks on phone: Not on file    Gets together: Not on file    Attends religious service: Not on file    Active member of club or organization: Not on file    Attends meetings of clubs or organizations: Not on file    Relationship status: Not on file  Other Topics Concern  . Not on file  Social History Narrative   Moved from a retirement home to a condominium 04-2013, independent living,  lives by herself ,  drives    Son lives in [place]   Right handed    1 cup of caffeine per day      Family History: The patient's family history includes Breast cancer in her mother; Cancer in her brother; Colitis in her brother; Diabetes in her mother; Heart disease in her father and mother; Lung cancer in her father; Prostate cancer in her father. There is no history of Colon cancer. ROS:   Please see the history of present illness.    All other systems reviewed and are negative.  EKGs/Labs/Other Studies Reviewed:    The following studies were reviewed today:   Recent Labs: 07/06/2017: ALT 9; Hemoglobin 12.4; Platelets 359.0 11/15/2017: TSH 2.280 04/18/2018: BUN 21; Creatinine, Ser 0.84; NT-Pro BNP 151; Potassium 4.7; Sodium 140  Recent Lipid Panel    Component Value Date/Time   CHOL 136 01/13/2018 1048   TRIG 141.0 01/13/2018 1048   HDL 54.80 01/13/2018 1048   CHOLHDL 2 01/13/2018 1048   VLDL 28.2 01/13/2018 1048   LDLCALC 53 01/13/2018 1048   LDLDIRECT 78.0 12/20/2012 1148    Physical Exam:    VS:  BP (!) 142/80 (BP Location: Right Arm, Patient Position: Sitting, Cuff Size: Normal)   Pulse 63   Ht 5' 4.5" (1.638 m)   Wt 163 lb (73.9 kg)   SpO2 96%   BMI 27.55 kg/m     Wt Readings from Last 3 Encounters:  05/12/18 163 lb (73.9 kg)  05/04/18 160 lb (72.6 kg)  04/20/18 163 lb 9.6 oz (74.2 kg)     GEN:  Well nourished, well developed in no acute  distress HEENT: Normal NECK: No JVD; No carotid bruits LYMPHATICS: No lymphadenopathy CARDIAC: RRR, no murmurs, rubs, gallops RESPIRATORY:  Clear to auscultation without rales, wheezing or rhonchi  ABDOMEN: Soft, non-tender, non-distended MUSCULOSKELETAL:  No edema; No deformity  SKIN: Warm and dry NEUROLOGIC:  Alert and oriented x 3 PSYCHIATRIC:  Normal affect    Signed, Shirlee More, MD  05/12/2018 1:12 PM    [facility]

## 2018-05-12 ENCOUNTER — Encounter: Payer: Self-pay | Admitting: Cardiology

## 2018-05-12 ENCOUNTER — Ambulatory Visit: Payer: Medicare Other | Admitting: Cardiology

## 2018-05-12 VITALS — BP 142/80 | HR 63 | Ht 64.5 in | Wt 163.0 lb

## 2018-05-12 DIAGNOSIS — I471 Supraventricular tachycardia: Secondary | ICD-10-CM

## 2018-05-12 DIAGNOSIS — I10 Essential (primary) hypertension: Secondary | ICD-10-CM | POA: Diagnosis not present

## 2018-05-12 NOTE — Patient Instructions (Addendum)
Medication Instructions:  Your physician recommends that you continue on your current medications as directed. Please refer to the Current Medication list given to you today.  If you need a refill on your cardiac medications before your next appointment, please call your pharmacy.   Lab work: You will have BMP today If you have labs (blood work) drawn today and your tests are completely normal, you will receive your results only by: Marland Kitchen MyChart Message (if you have MyChart) OR . A paper copy in the mail If you have any lab test that is abnormal or we need to change your treatment, we will call you to review the results.  Testing/Procedures: NONE  Follow-Up: At [facility], you and your health needs are our priority.  As part of our continuing mission to provide you with exceptional heart care, we have created designated Provider Care Teams.  These Care Teams include your primary Cardiologist (physician) and Advanced Practice Providers (APPs -  Physician Assistants and Nurse Practitioners) who all work together to provide you with the care you need, when you need it.  You will follow up with our office if symptoms worsen or fail to improve.

## 2018-05-13 LAB — BASIC METABOLIC PANEL
BUN / CREAT RATIO: 18 (ref 12–28)
BUN: 21 mg/dL (ref 8–27)
CO2: 25 mmol/L (ref 20–29)
Calcium: 9.7 mg/dL (ref 8.7–10.3)
Chloride: 99 mmol/L (ref 96–106)
Creatinine, Ser: 1.14 mg/dL — ABNORMAL HIGH (ref 0.57–1.00)
GFR, EST AFRICAN AMERICAN: 50 mL/min/{1.73_m2} — AB (ref >59)
GFR, EST NON AFRICAN AMERICAN: 44 mL/min/{1.73_m2} — AB (ref >59)
Glucose: 99 mg/dL (ref 65–99)
Potassium: 4.8 mmol/L (ref 3.5–5.2)
Sodium: 139 mmol/L (ref 134–144)

## 2018-05-16 ENCOUNTER — Ambulatory Visit: Payer: Medicare Other | Admitting: Physical Therapy

## 2018-05-16 ENCOUNTER — Encounter: Payer: Self-pay | Admitting: Physical Therapy

## 2018-05-16 DIAGNOSIS — M542 Cervicalgia: Secondary | ICD-10-CM

## 2018-05-16 DIAGNOSIS — M6281 Muscle weakness (generalized): Secondary | ICD-10-CM

## 2018-05-16 DIAGNOSIS — R293 Abnormal posture: Secondary | ICD-10-CM

## 2018-05-16 NOTE — Therapy (Signed)
[facility] [address]  [place], [place], [address] Phone: [phone]   Fax:  [phone]  Physical Therapy Treatment  Patient Details  Name: DESHANNA KAMA MRN: [id number] Date of Birth: [birth year] Referring Provider (PT): Karlton Lemon, MD   Encounter Date: 05/16/2018  PT End of Session - 05/16/18 1408    Visit Number  2    Number of Visits  12    Date for PT Re-Evaluation  06/20/18    Authorization Type  [employer] Medicare    PT Start Time  1408    PT Stop Time  1450    PT Time Calculation (min)  42 min    Activity Tolerance  Patient tolerated treatment well;No increased pain    Behavior During Therapy  WFL for tasks assessed/performed       Past Medical History:  Diagnosis Date  . Anxiety and depression   . Back pain    lumbar  . Fecal incontinence    Dr. Michail Sermon, likely due to loose stools in conjunction with pelvic muscle laxity from aging, recommended to stop Miralax  . GI (gastrointestinal bleed)    hx of colon polyps 2004, diverticulosis, internal hemmorhids  . Headache   . Headache(784.0)   . Hydronephrosis    Right 2008 s/p urology eval CT/cysto: observation/monitor  . Hyperlipidemia   . Hypertension   . Knee pain    right chronic  . Migraine    on topamax , per neuro  . Osteoporosis   . Pulmonary nodules    Wert. First seen by CT only 05/04/03, no change on f/u 08/08/06 -Macroscopic changes rml 04/27/07 > resolved april 6,2010 no further w/u   . Seborrheic keratosis    Left inferior posterior flank  . SVT (supraventricular tachycardia) (HCC)    sees Dr.Ross    Past Surgical History:  Procedure Laterality Date  . ABDOMINAL HYSTERECTOMY    . APPENDECTOMY    . BASAL CELL CARCINOMA EXCISION    . BLADDER REPAIR    . CATARACT EXTRACTION  2008   Dr.Digby  . CHOLECYSTECTOMY    . lumbar reconstructive surgery  01/07/2009  . R ovary removed    . ROTATOR CUFF REPAIR  9/-2011  . SHOULDER  SURGERY  12-2011   LEFT  . TONSILLECTOMY    . TOTAL KNEE ARTHROPLASTY     right  . trigger finger surgery     L thumb (2014), R ring finger 05-2013    There were no vitals filed for this visit.  Subjective Assessment - 05/16/18 1411    Subjective  Pt thinks her neck is getting a little better. Reports completing her HEP 2x/day and notes when she performs scap retraction that it helps her lift her head up.    Pertinent History  R TKR; B RTC repair    Limitations  Standing;Walking;House hold activities    How long can you stand comfortably?  "not at all"    Diagnostic tests  Neck x-ray 04/20/18 - 1. No cervical spine fracture or acute malalignment; 2. Reversal of the normal cervical lordosis, usually due to positioning and/or muscle spasm; 3. Moderate multilevel cervical degenerative disc disease, most prominent at C2-3; 4. Mild degenerative foraminal stenosis on the right at C4-5.    Patient Stated Goals  "able to move my head any way I want to w/o extra pain"    Currently in Pain?  No/denies    Pain Score  0-No pain  5/10 with activity   Pain Location  Neck    Pain Orientation  Lower    Pain Onset  More than a month ago                       [facility] Adult PT Treatment/Exercise - 05/16/18 1408      Exercises   Exercises  Neck      Neck Exercises: Machines for Strengthening   UBE (Upper Arm Bike)  L1.0 x 4 min (2" fwd/2"back)      Neck Exercises: Theraband   Shoulder External Rotation  10 reps   yellow TB   Shoulder External Rotation Limitations  hookying, 3-5 sec hold with cues for scap retraction    Horizontal ABduction  10 reps   yellow TB   Horizontal ABduction Limitations  hookying, 3-5 sec hold with cues for scap retraction      Neck Exercises: Seated   Other Seated Exercise  Cervical extension with towel support 10 x 3 sec    Other Seated Exercise  Scap retraction + depression 10 x 5 sec - cues to maintain neutral cervical spine      Neck Exercises:  Supine   Neck Retraction  10 reps;5 secs    Neck Retraction Limitations  into thin pillow      Manual Therapy   Manual Therapy  Soft tissue mobilization;Myofascial release;Passive ROM;Manual Traction    Manual therapy comments  supine    Soft tissue mobilization  B UT, LS, SCM, scalenes, cervical paraspinals & pecs    Myofascial Release  manual TPR to R UT & lateral pecs; B suboccipital release    Passive ROM  gentle cervical PROM in all planes to tolerance    Manual Traction  very gentle manual distraction 3 x 30 sec               PT Short Term Goals - 05/09/18 1540      PT SHORT TERM GOAL #1   Title  Independent with intial HEP    Status  New    Target Date  05/30/18      PT SHORT TERM GOAL #2   Title  Patient to demonstrate appropriate posture and body mechanics needed for daily activities    Status  New    Target Date  05/30/18        PT Long Term Goals - 05/09/18 1540      PT LONG TERM GOAL #1   Title  Independent with ongoing/advanced HEP    Status  New    Target Date  06/20/18      PT LONG TERM GOAL #2   Title  Patient to improve cervical AROM to [facility] without pain provocation     Status  New    Target Date  06/20/18      PT LONG TERM GOAL #3   Title  B shoulder strength >/= 4+/5 for improved postural stability    Status  New    Target Date  06/20/18      PT LONG TERM GOAL #4   Title  Patient to report ability to perform ADLs and daily household tasks without increased pain    Status  New    Target Date  06/20/18            Plan - 05/16/18 1444    Clinical Impression Statement  Pat reporting good compliance with HEP, completing exercises 2x/day - feels exercises are helping, especially  scap retraction which she feels helps her lift her head more upright. Initial focus of therapy session today on manual therapy to address active TP in R UT as well as abnormal muscle tension t/o cervical paraspinals and upper shoulder musculature with pt reporting  good relief. HEP reviewed with pt able to perform good return demonstration. Progressed scapular activation and strengthening in hooklying with theraband resisted horiz abduction and ER with good tolerance and may consider adding these to HEP on next visit.    Rehab Potential  Good    PT Treatment/Interventions  Patient/family education;Neuromuscular re-education;Therapeutic exercise;Therapeutic activities;Functional mobility training;Electrical Stimulation;Moist Heat;Ultrasound;Iontophoresis 4mg /ml Dexamethasone;Manual techniques;Passive range of motion;Dry needling;Taping;ADLs/Self Care Home Management    PT Next Visit Plan  Postural education; Gentle cervical ROM and stretching; Postural strengthening; Manual therapy and modalities as indicated    Consulted and Agree with Plan of Care  Patient       Patient will benefit from skilled therapeutic intervention in order to improve the following deficits and impairments:  Pain, Impaired flexibility, Increased muscle spasms, Decreased range of motion, Decreased strength, Postural dysfunction, Improper body mechanics, Impaired UE functional use, Decreased activity tolerance  Visit Diagnosis: Cervicalgia  Abnormal posture  Muscle weakness (generalized)     Problem List Patient Active Problem List   Diagnosis Date Noted  . Edema of both lower extremities 04/18/2018  . Chronic migraine 11/15/2017  . Memory loss 11/15/2017  . Osteoporosis 07/09/2017  . CKD (chronic kidney disease) stage 2, GFR 60-89 ml/min 01/06/2016  . Abdominal pain 09/10/2015  . Pulmonary nodules/lesions, multiple 09/10/2015  . Small bowel obstruction, partial ([place]) 09/10/2015  . Arthritis 09/10/2015  . Partial small bowel obstruction ([place]) 09/10/2015  . PCP NOTES >>>>>> 04/17/2015  . Hemorrhoids 09/17/2011  . Annual physical exam 03/19/2011  . DJD -back pain-on Celebrex   . Cough 09/25/2010  . Constipation 01/21/2010  . DIVERTICULOSIS OF COLON 09/02/2009  .  PERSONAL HISTORY OF COLONIC POLYPS 09/02/2009  . FULL INCONTINENCE OF FECES 09/02/2009  . Essential hypertension 08/10/2007  . Headache 08/10/2007  . Pulmonary nodules 04/25/2007  . Anxiety state 02/03/2007  . Depression 02/03/2007  . KNEE PAIN, RIGHT, CHRONIC 02/03/2007  . Hyperlipidemia 04/26/2006  . DIZZINESS 04/26/2006  . SVT (supraventricular tachycardia) ([place]) 04/26/2006    [name], PT, MPT 05/16/2018, 6:44 PM  [facility] [address]  [place], [place], [address] Phone: [phone]   Fax:  [phone]  Name: DANICE DIPPOLITO MRN: [id number] Date of Birth: 12/16/1929

## 2018-05-18 ENCOUNTER — Ambulatory Visit: Payer: Medicare Other

## 2018-05-18 DIAGNOSIS — M542 Cervicalgia: Secondary | ICD-10-CM

## 2018-05-18 DIAGNOSIS — M6281 Muscle weakness (generalized): Secondary | ICD-10-CM

## 2018-05-18 DIAGNOSIS — R293 Abnormal posture: Secondary | ICD-10-CM

## 2018-05-18 NOTE — Therapy (Signed)
[facility] [address]  [place], [place], [address] Phone: [phone]   Fax:  [phone]  Physical Therapy Treatment  Patient Details  Name: [name] MRN: [id number] Date of Birth: January 29, 1930 Referring Provider (PT): Karlton Lemon, MD   Encounter Date: 05/18/2018  PT End of Session - 05/18/18 1540    Visit Number  3    Number of Visits  12    Date for PT Re-Evaluation  06/20/18    Authorization Type  [employer] Medicare    PT Start Time  9741    PT Stop Time  1626   ended visit with 10 min moist heat to cervical spine    PT Time Calculation (min)  56 min    Activity Tolerance  Patient tolerated treatment well;No increased pain    Behavior During Therapy  WFL for tasks assessed/performed       Past Medical History:  Diagnosis Date  . Anxiety and depression   . Back pain    lumbar  . Fecal incontinence    Dr. Michail Sermon, likely due to loose stools in conjunction with pelvic muscle laxity from aging, recommended to stop Miralax  . GI (gastrointestinal bleed)    hx of colon polyps 2004, diverticulosis, internal hemmorhids  . Headache   . Headache(784.0)   . Hydronephrosis    Right 2008 s/p urology eval CT/cysto: observation/monitor  . Hyperlipidemia   . Hypertension   . Knee pain    right chronic  . Migraine    on topamax , per neuro  . Osteoporosis   . Pulmonary nodules    [place]. First seen by CT only 05/04/03, no change on f/u 08/08/06 -Macroscopic changes rml 04/27/07 > resolved april 6,2010 no further w/u   . Seborrheic keratosis    Left inferior posterior flank  . SVT (supraventricular tachycardia) (HCC)    sees Dr.Ross    Past Surgical History:  Procedure Laterality Date  . ABDOMINAL HYSTERECTOMY    . APPENDECTOMY    . BASAL CELL CARCINOMA EXCISION    . BLADDER REPAIR    . CATARACT EXTRACTION  2008   Dr.Digby  . CHOLECYSTECTOMY    . lumbar reconstructive surgery  01/07/2009  . R ovary  removed    . ROTATOR CUFF REPAIR  9/-2011  . SHOULDER SURGERY  12-2011   LEFT  . TONSILLECTOMY    . TOTAL KNEE ARTHROPLASTY     right  . trigger finger surgery     L thumb (2014), R ring finger 05-2013    There were no vitals filed for this visit.  Subjective Assessment - 05/18/18 1537    Subjective  Pt. reporting neck pain is improving.      Diagnostic tests  Neck x-ray 04/20/18 - 1. No cervical spine fracture or acute malalignment; 2. Reversal of the normal cervical lordosis, usually due to positioning and/or muscle spasm; 3. Moderate multilevel cervical degenerative disc disease, most prominent at C2-3; 4. Mild degenerative foraminal stenosis on the right at C4-5.    Patient Stated Goals  "able to move my head any way I want to w/o extra pain"    Currently in Pain?  Yes    Pain Score  3     Pain Location  Neck    Pain Orientation  Lower;Right    Pain Descriptors / Indicators  Sharp;Aching    Pain Type  Acute pain    Pain Onset  More than a month  ago    Pain Frequency  Constant    Multiple Pain Sites  No                       [facility] Adult PT Treatment/Exercise - 05/18/18 1621      Self-Care   Self-Care  Other Self-Care Comments    Other Self-Care Comments   Discussed need for upright retracted posture with pt. verbalizing understanding       Neck Exercises: Machines for Strengthening   UBE (Upper Arm Bike)  L1.0 x 6 min (3" fwd/3"back)      Neck Exercises: Seated   Neck Retraction  10 reps;3 secs    Neck Retraction Limitations  3" hold     Cervical Rotation  10 reps    Money  15 reps;3 secs      Modalities   Modalities  Moist Heat      Moist Heat Therapy   Number Minutes Moist Heat  10 Minutes    Moist Heat Location  Cervical      Manual Therapy   Manual Therapy  Soft tissue mobilization;Myofascial release;Passive ROM;Manual Traction    Manual therapy comments  supine    Soft tissue mobilization  STM to B UT, LS, scalenes, cervical paraspinals,      Myofascial Release  manual TPR to R UT     Passive ROM  gentle cervical PROM in all planes to tolerance    Manual Traction  very gentle manual distraction 3 x 30 sec      Neck Exercises: Stretches   Upper Trapezius Stretch  Right;Left;1 rep;30 seconds   manually guided by therapist    Levator Stretch  Right;Left;1 rep;30 seconds   manually guided by therapist              PT Short Term Goals - 05/18/18 1544      PT SHORT TERM GOAL #1   Title  Independent with intial HEP    Status  On-going      PT SHORT TERM GOAL #2   Title  Patient to demonstrate appropriate posture and body mechanics needed for daily activities    Status  On-going        PT Long Term Goals - 05/18/18 1545      PT LONG TERM GOAL #1   Title  Independent with ongoing/advanced HEP    Status  On-going      PT LONG TERM GOAL #2   Title  Patient to improve cervical AROM to [facility] without pain provocation     Status  On-going      PT LONG TERM GOAL #3   Title  B shoulder strength >/= 4+/5 for improved postural stability    Status  On-going      PT LONG TERM GOAL #4   Title  Patient to report ability to perform ADLs and daily household tasks without increased pain    Status  On-going            Plan - 05/18/18 1548    Clinical Impression Statement  [name] reporting some improvement in upper shoulder comfort after manual therapy last session.  Session today focused heavily on MT to upper shoulder and cervical musculature for improved tissue quality and cervical ROM.  Pt. tolerated all gentle scapular/postural strengthening activities well today without increased pain.  Ended visit with moist heat to cervical spine to promote relaxation in musculature.      Rehab Potential  Good  PT Frequency  2x / week    PT Duration  6 weeks    PT Treatment/Interventions  Patient/family education;Neuromuscular re-education;Therapeutic exercise;Therapeutic activities;Functional mobility training;Electrical  Stimulation;Moist Heat;Ultrasound;Iontophoresis 4mg /ml Dexamethasone;Manual techniques;Passive range of motion;Dry needling;Taping;ADLs/Self Care Home Management    PT Next Visit Plan  Postural education; Gentle cervical ROM and stretching; Postural strengthening; Manual therapy and modalities as indicated    Consulted and Agree with Plan of Care  Patient       Patient will benefit from skilled therapeutic intervention in order to improve the following deficits and impairments:  Pain, Impaired flexibility, Increased muscle spasms, Decreased range of motion, Decreased strength, Postural dysfunction, Improper body mechanics, Impaired UE functional use, Decreased activity tolerance  Visit Diagnosis: Cervicalgia  Abnormal posture  Muscle weakness (generalized)     Problem List Patient Active Problem List   Diagnosis Date Noted  . Edema of both lower extremities 04/18/2018  . Chronic migraine 11/15/2017  . Memory loss 11/15/2017  . Osteoporosis 07/09/2017  . CKD (chronic kidney disease) stage 2, GFR 60-89 ml/min 01/06/2016  . Abdominal pain 09/10/2015  . Pulmonary nodules/lesions, multiple 09/10/2015  . Small bowel obstruction, partial ([place]) 09/10/2015  . Arthritis 09/10/2015  . Partial small bowel obstruction ([place]) 09/10/2015  . PCP NOTES >>>>>> 04/17/2015  . Hemorrhoids 09/17/2011  . Annual physical exam 03/19/2011  . DJD -back pain-on Celebrex   . Cough 09/25/2010  . Constipation 01/21/2010  . DIVERTICULOSIS OF COLON 09/02/2009  . PERSONAL HISTORY OF COLONIC POLYPS 09/02/2009  . FULL INCONTINENCE OF FECES 09/02/2009  . Essential hypertension 08/10/2007  . Headache 08/10/2007  . Pulmonary nodules 04/25/2007  . Anxiety state 02/03/2007  . Depression 02/03/2007  . KNEE PAIN, RIGHT, CHRONIC 02/03/2007  . Hyperlipidemia 04/26/2006  . DIZZINESS 04/26/2006  . SVT (supraventricular tachycardia) ([place]) 04/26/2006    Bess Harvest, PTA 05/18/18 6:14 PM   [facility] [address]  [place], [place], [address] Phone: [phone]   Fax:  [phone]  Name: [name] MRN: [id number] Date of Birth: [birth year]

## 2018-05-22 ENCOUNTER — Encounter

## 2018-05-22 ENCOUNTER — Ambulatory Visit: Payer: Self-pay | Admitting: *Deleted

## 2018-05-22 ENCOUNTER — Telehealth: Payer: Self-pay

## 2018-05-22 ENCOUNTER — Ambulatory Visit: Payer: Medicare Other | Admitting: Neurology

## 2018-05-22 NOTE — Telephone Encounter (Signed)
See triage encounter for 05/22/18

## 2018-05-22 NOTE — Telephone Encounter (Signed)
Copied from [place] [id number]. Topic: Referral - Request for Referral >> May 22, 2018  4:02 PM Ivar Drape wrote: Reason for CRM:   Patient is short of breath all of the time and she is wheezing.  Her heart has been recently checked and everything was fine, but she would like to see a Pulmonary doctor, Dr. Christinia Gully.    Has patient seen PCP for this complaint?  YES *If NO, is insurance requiring patient see PCP for this issue before PCP can refer them? Referral for which specialty:  Pulmonary Preferred provider/office: Dr. Christinia Gully Reason for referral:   Short of breath and wheezing, but her heart is fine.

## 2018-05-22 NOTE — Telephone Encounter (Signed)
See telephone encounter from 05/22/18. Copied from [place] [id number]. Topic: Referral - Request for Referral >> May 22, 2018  4:02 PM Ivar Drape wrote: Reason for CRM:   Patient is short of breath all of the time and she is wheezing.  Her heart has been recently checked and everything was fine, but she would like to see a Pulmonary doctor, Dr. Christinia Gully.    Has patient seen PCP for this complaint?  YES *If NO, is insurance requiring patient see PCP for this issue before PCP can refer them? Referral for which specialty:  Pulmonary Preferred provider/office: Dr. Christinia Gully Reason for referral:   Short of breath and wheezing, but her heart is fine.       Documentation      Pt originally calling to ask for a referral to a pulmonologist with complaints of SOB and wheezing. Pt states that she has been experiencing these symptoms for about a week. Pt states she hears herself wheezing every once in a while but she feels SOB all the time. Pt has not complaints of chest pain or fever a this time. Pt states she does feel lightheaded at times but this is due to having a history of imbalance. Pt scheduled for appt tomorrow with Dr. Larose Kells and advised if symptoms become worse to seek treatment in the ED tonight. Pt verbalized understanding.  Reason for Disposition . [1] MILD difficulty breathing (e.g., minimal/no SOB at rest, SOB with walking, pulse <100) AND [2] NEW-onset or WORSE than normal  Answer Assessment - Initial Assessment Questions 1. RESPIRATORY STATUS: "Describe your breathing?" (e.g., wheezing, shortness of breath, unable to speak, severe coughing)      SOB all the time and with doing activity , wheezing can be heard once in a while 2. ONSET: "When did this breathing problem begin?"      About a week ago 3. PATTERN "Does the difficult breathing come and go, or has it been constant since it started?"      Pt states it happens all the time 4. SEVERITY: "How bad is your breathing?" (e.g.,  mild, moderate, severe)    - MILD: No SOB at rest, mild SOB with walking, speaks normally in sentences, can lay down, no retractions, pulse < 100.    - MODERATE: SOB at rest, SOB with minimal exertion and prefers to sit, cannot lie down flat, speaks in phrases, mild retractions, audible wheezing, pulse 100-120.    - SEVERE: Very SOB at rest, speaks in single words, struggling to breathe, sitting hunched forward, retractions, pulse > 120      mild 5. RECURRENT SYMPTOM: "Have you had difficulty breathing before?" If so, ask: "When was the last time?" and "What happened that time?"      No 6. CARDIAC HISTORY: "Do you have any history of heart disease?" (e.g., heart attack, angina, bypass surgery, angioplasty)      No but does have an irregular HB, but was told that Heart  7. LUNG HISTORY: "Do you have any history of lung disease?"  (e.g., pulmonary embolus, asthma, emphysema)     No 8. CAUSE: "What do you think is causing the breathing problem?"      unsure 9. OTHER SYMPTOMS: "Do you have any other symptoms? (e.g., dizziness, runny nose, cough, chest pain, fever)     Pt states she has sinus problems, chest congestion (Mucinex DM) and runny nose 10. TRAVEL: "Have you traveled out of the country in the last month?" (e.g., travel history,  exposures)       No  Protocols used: BREATHING DIFFICULTY-A-AH

## 2018-05-22 NOTE — Telephone Encounter (Signed)
Please call to triage

## 2018-05-23 ENCOUNTER — Ambulatory Visit: Payer: Medicare Other | Attending: Family Medicine | Admitting: Physical Therapy

## 2018-05-23 ENCOUNTER — Ambulatory Visit: Payer: Medicare Other | Admitting: Internal Medicine

## 2018-05-23 ENCOUNTER — Ambulatory Visit (HOSPITAL_BASED_OUTPATIENT_CLINIC_OR_DEPARTMENT_OTHER)
Admission: RE | Admit: 2018-05-23 | Discharge: 2018-05-23 | Disposition: A | Discharge status: Home / Self Care | Payer: Medicare Other | Source: Ambulatory Visit | Attending: Internal Medicine | Admitting: Internal Medicine

## 2018-05-23 ENCOUNTER — Encounter: Payer: Self-pay | Admitting: Internal Medicine

## 2018-05-23 VITALS — BP 126/68 | HR 63 | Temp 97.3°F | Resp 16 | Ht 64.5 in | Wt 165.4 lb

## 2018-05-23 VITALS — BP 142/74 | HR 60

## 2018-05-23 DIAGNOSIS — R0600 Dyspnea, unspecified: Secondary | ICD-10-CM

## 2018-05-23 DIAGNOSIS — R0609 Other forms of dyspnea: Secondary | ICD-10-CM | POA: Diagnosis not present

## 2018-05-23 DIAGNOSIS — R293 Abnormal posture: Secondary | ICD-10-CM | POA: Diagnosis present

## 2018-05-23 DIAGNOSIS — M6281 Muscle weakness (generalized): Secondary | ICD-10-CM

## 2018-05-23 DIAGNOSIS — R0602 Shortness of breath: Secondary | ICD-10-CM | POA: Diagnosis not present

## 2018-05-23 DIAGNOSIS — M542 Cervicalgia: Secondary | ICD-10-CM

## 2018-05-23 NOTE — Patient Instructions (Signed)
GO TO THE LAB : Get the blood work     STOP BY THE FIRST FLOOR:  get the XR    ER if symptoms severe.

## 2018-05-23 NOTE — Telephone Encounter (Signed)
I do not have a problem referring to pulmonary for shortness of breath however I prefer her to come to this office first to be evaluated for that a specific problem. Please to schedule an appointment at her earliest convenience

## 2018-05-23 NOTE — Telephone Encounter (Signed)
Appt scheduled 2/4 at 3:40.

## 2018-05-23 NOTE — Progress Notes (Signed)
Subjective:    Patient ID: [name], female    DOB: 1929-08-08, 85 y.o.   MRN: [id number]  DOS:  05/23/2018 Type of visit - description: Acute visit See phone note, patient called stating that she was SOB and wheezing. Today, she reports that there was a significant change on her ability to walk for the last week.. Has developed dyspnea on exertion walking few steps within her house. Admits to wheezing for the last couple of days  Seen by cardiology 04/18/2018, at the time BP was felt to be elevated, diltiazem stopped, was prescribed telmisartan HCTZ. BMP and echocardiogram unremarkable. Was seen again by cardiology 05/12/2018, BP was noted to be better, had a follow-up BMP.  Wt Readings from Last 3 Encounters:  05/23/18 165 lb 6 oz (75 kg)  05/12/18 163 lb (73.9 kg)  05/04/18 160 lb (72.6 kg)   BP Readings from Last 3 Encounters:  05/23/18 126/68  05/23/18 (!) 142/74  05/12/18 (!) 142/80     Review of Systems Denies fever chills Mild sinus congestion No chest pain. Few months history of mild ankle edema. No orthopnea per se but in the last 2 months she has been a sleeping recliner as she feels more comfortable that way, hard to say if she has orthopnea. Denies nausea vomiting or blood in the stools. Admits to some cough with a small amount of yellowish sputum. No recent airplane or prolonged car trip   Past Medical History:  Diagnosis Date  . Anxiety and depression   . Back pain    lumbar  . Fecal incontinence    Dr. Michail Sermon, likely due to loose stools in conjunction with pelvic muscle laxity from aging, recommended to stop Miralax  . GI (gastrointestinal bleed)    hx of colon polyps 2004, diverticulosis, internal hemmorhids  . Headache   . Headache(784.0)   . Hydronephrosis    Right 2008 s/p urology eval CT/cysto: observation/monitor  . Hyperlipidemia   . Hypertension   . Knee pain    right chronic  . Migraine    on topamax , per neuro  .  Osteoporosis   . Pulmonary nodules    Wert. First seen by CT only 05/04/03, no change on f/u 08/08/06 -Macroscopic changes rml 04/27/07 > resolved april 6,2010 no further w/u   . Seborrheic keratosis    Left inferior posterior flank  . SVT (supraventricular tachycardia) (HCC)    sees Dr.Ross    Past Surgical History:  Procedure Laterality Date  . ABDOMINAL HYSTERECTOMY    . APPENDECTOMY    . BASAL CELL CARCINOMA EXCISION    . BLADDER REPAIR    . CATARACT EXTRACTION  2008   Dr.Digby  . CHOLECYSTECTOMY    . lumbar reconstructive surgery  01/07/2009  . R ovary removed    . ROTATOR CUFF REPAIR  9/-2011  . SHOULDER SURGERY  12-2011   LEFT  . TONSILLECTOMY    . TOTAL KNEE ARTHROPLASTY     right  . trigger finger surgery     L thumb (2014), R ring finger 05-2013    Social History   Socioeconomic History  . Marital status: Widowed    Spouse name: Not on file  . Number of children: 1  . Years of education: college   . Highest education level: Doctorate  Occupational History  . Occupation: retired   Scientific laboratory technician  . Financial resource strain: Not on file  . Food insecurity:    Worry: Not on file  Inability: Not on file  . Transportation needs:    Medical: Not on file    Non-medical: Not on file  Tobacco Use  . Smoking status: Never Smoker  . Smokeless tobacco: Never Used  Substance and Sexual Activity  . Alcohol use: Yes    Alcohol/week: 1.0 standard drinks    Types: 1 Glasses of wine per week    Comment: rarely, 1 glass of wine monthly  . Drug use: No  . Sexual activity: Not Currently  Lifestyle  . Physical activity:    Days per week: Not on file    Minutes per session: Not on file  . Stress: Not on file  Relationships  . Social connections:    Talks on phone: Not on file    Gets together: Not on file    Attends religious service: Not on file    Active member of club or organization: Not on file    Attends meetings of clubs or organizations: Not on file     Relationship status: Not on file  . Intimate partner violence:    Fear of current or ex partner: Not on file    Emotionally abused: Not on file    Physically abused: Not on file    Forced sexual activity: Not on file  Other Topics Concern  . Not on file  Social History Narrative   Moved from a retirement home to a condominium 04-2013, independent living,  lives by herself , drives    Son lives in [place]   Right handed    1 cup of caffeine per day       Allergies as of 05/23/2018      Reactions   Amitriptyline Hcl    REACTION: hallucinations   Carisoprodol    REACTION: nightmares,rash,tachycardia   Codeine    REACTION: "deathly ill" nauesa, dizzy,weak   Fosamax [alendronate Sodium] Other (See Comments)   Dental issues   Meloxicam    REACTION: sensitive to sun; facial redness; may cause severe abd pain   Metronidazole    REACTION: abd pain   Nortriptyline Hcl    REACTION: hallucinations   Oxaprozin    REACTION: abd pain   Promethazine Hcl    REACTION: spastic arms and legs flailing while awake and unusual behavior while sleep - walking all over house during night   Rofecoxib    REACTION: rash   Sumatriptan    REACTION: abdominal pain, blurry vision, drowsiness   Tramadol Other (See Comments)   Dizziness and constipation   Venlafaxine    Hallucinations      Medication List       Accurate as of May 23, 2018  3:33 PM. Always use your most recent med list.        acetaminophen 500 MG tablet Commonly known as:  TYLENOL Take 500 mg by mouth every 6 (six) hours as needed for moderate pain.   amoxicillin-clavulanate 875-125 MG tablet Commonly known as:  AUGMENTIN Prior to dental appts   atorvastatin 40 MG tablet Commonly known as:  LIPITOR Take 1 tablet (40 mg total) by mouth at bedtime.   butalbital-acetaminophen-caffeine 50-325-40 MG tablet Commonly known as:  FIORICET, ESGIC Take 1 tablet by mouth every 6 (six) hours as needed for headache. Do not refill  in less than one month, cancel previous Rx   celecoxib 200 MG capsule Commonly known as:  CELEBREX Take 200 mg by mouth daily.   CRANBERRY PO Take 1 capsule by mouth daily.  gabapentin 100 MG capsule Commonly known as:  NEURONTIN Take 2-3 capsules (200-300 mg total) by mouth 3 (three) times daily.   multivitamin per tablet Take 1 tablet by mouth daily.   NON FORMULARY Take 2 capsules by mouth 2 (two) times daily. Hydro Eye   PROBIOTIC DAILY PO Take 1 capsule by mouth daily.   propranolol ER 60 MG 24 hr capsule Commonly known as:  INDERAL LA Take 1 capsule (60 mg total) by mouth daily.   telmisartan-hydrochlorothiazide 40-12.5 MG tablet Commonly known as:  MICARDIS HCT Take 1 tablet by mouth daily.   trimethoprim 100 MG tablet Commonly known as:  TRIMPEX Take 100 mg by mouth daily.           Objective:   Physical Exam BP 126/68 (BP Location: Left Arm, Patient Position: Sitting, Cuff Size: Small)   Pulse 63   Temp (!) 97.3 F (36.3 C) (Oral)   Resp 16   Ht 5' 4.5" (1.638 m)   Wt 165 lb 6 oz (75 kg)   SpO2 96%   BMI 27.95 kg/m  General:   Well developed, NAD, BMI noted.  HEENT:  Normocephalic . Face symmetric, atraumatic Neck: JVD seems slightly  elevated at 45 degrees Lungs:  Decreased breath sounds but clear Normal respiratory effort, no intercostal retractions, no accessory muscle use. Heart: RRR,  no murmur.  no pretibial edema, trace peri-ankle edema  abdomen:  Not distended, soft, non-tender. No rebound or rigidity.   Skin: Not pale. Not jaundice Neurologic:  alert & oriented X3.  Speech normal, gait appropriate for age and unassisted Psych--  Cognition and judgment appear intact.  Cooperative with normal attention span and concentration.  Behavior appropriate. No anxious or depressed appearing.     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 SVT, saw Dr. Harrington Challenger 2014, on cardiazem NEURO: Migraines: on gabapentin, flexeril prn sees neurology ;d/c topamax ~ 08/17/17 (s/e)(Dr Domingo Cocking)   Chronic Dizzies : 2012/2013: w/u (-) including echo, MRA head-neck, Brain MRI, CT head.  06/2017 brain MRI wnl for age  Mild cognitive impairment: Per neurology note 10/2017. GI: Fecal incontinence, GI Dr. Michail Sermon H/o SBO 08-2015 Pulmonary nodules First seen by CT only 05/04/03, no change on f/u 08/08/06 -Macroscopic changes rml 04/27/07 > resolved april 6,2010 no further w/u  Nodules noted again: CT 08/2015  CT 04-2016: Stable, recheck in one year if high risk only. [facility]  Sees Dermatology  GU: Sees urology regularly, on daily antibiotics  PLAN: DOE: Presents with increased DOE for the last week, on physical exam there a mild JVD elevation, very mild lower extremity edema, she reports some cough but lung exam benign. Recently seen by cardiology for elevated BP, echocardiogram satisfactory, proBNP normal.  Medications were adjusted, CCBs stopped,  telmisartan HCTZ started. EKG today: Normal sinus rhythm Ambulating O2 sat: O2 sat decreased from 96 to 92% with ambulation. Overall, etiology of worsening DOE unclear.  We will check a BMP, CBC, proBNP and a chest  x-ray.  Further advised with results. Addendum: Labs, chest x-ray okay.  To be sure will check a d-dimer.

## 2018-05-23 NOTE — Telephone Encounter (Signed)
FYI

## 2018-05-23 NOTE — Therapy (Signed)
[facility] [address]  [place], [place], [address] Phone: [phone]   Fax:  [phone]  Physical Therapy Treatment  Patient Details  Name: [name] MRN: [id number] Date of Birth: [birth year] Referring Provider (PT): Karlton Lemon, MD   Encounter Date: 05/23/2018  PT End of Session - 05/23/18 1404    Visit Number  4    Number of Visits  12    Date for PT Re-Evaluation  06/20/18    Authorization Type  [employer] Medicare    PT Start Time  1404    PT Stop Time  1453    PT Time Calculation (min)  49 min    Activity Tolerance  Patient tolerated treatment well;No increased pain    Behavior During Therapy  WFL for tasks assessed/performed       Past Medical History:  Diagnosis Date  . Anxiety and depression   . Back pain    lumbar  . Fecal incontinence    Dr. Michail Sermon, likely due to loose stools in conjunction with pelvic muscle laxity from aging, recommended to stop Miralax  . GI (gastrointestinal bleed)    hx of colon polyps 2004, diverticulosis, internal hemmorhids  . Headache   . Headache(784.0)   . Hydronephrosis    Right 2008 s/p urology eval CT/cysto: observation/monitor  . Hyperlipidemia   . Hypertension   . Knee pain    right chronic  . Migraine    on topamax , per neuro  . Osteoporosis   . Pulmonary nodules    Wert. First seen by CT only 05/04/03, no change on f/u 08/08/06 -Macroscopic changes rml 04/27/07 > resolved april 6,2010 no further w/u   . Seborrheic keratosis    Left inferior posterior flank  . SVT (supraventricular tachycardia) (HCC)    sees Dr.Ross    Past Surgical History:  Procedure Laterality Date  . ABDOMINAL HYSTERECTOMY    . APPENDECTOMY    . BASAL CELL CARCINOMA EXCISION    . BLADDER REPAIR    . CATARACT EXTRACTION  2008   Dr.Digby  . CHOLECYSTECTOMY    . lumbar reconstructive surgery  01/07/2009  . R ovary removed    . ROTATOR CUFF REPAIR  9/-2011  . SHOULDER  SURGERY  12-2011   LEFT  . TONSILLECTOMY    . TOTAL KNEE ARTHROPLASTY     right  . trigger finger surgery     L thumb (2014), R ring finger 05-2013    Vitals:   05/23/18 1413  BP: (!) 142/74  Pulse: 60  SpO2: 95%    Subjective Assessment - 05/23/18 1414    Subjective  During warm-up, pt reporting plan to see Dr. Larose Kells following PT today due to increased SOB.    Pertinent History  R TKR; B RTC repair    Diagnostic tests  Neck x-ray 04/20/18 - 1. No cervical spine fracture or acute malalignment; 2. Reversal of the normal cervical lordosis, usually due to positioning and/or muscle spasm; 3. Moderate multilevel cervical degenerative disc disease, most prominent at C2-3; 4. Mild degenerative foraminal stenosis on the right at C4-5.    Patient Stated Goals  "able to move my head any way I want to w/o extra pain"    Currently in Pain?  Yes    Pain Score  5     Pain Location  Neck    Pain Orientation  Lower;Right    Pain Type  Acute pain  Pain Frequency  Constant                       [facility] Adult PT Treatment/Exercise - 05/23/18 1404      Exercises   Exercises  Neck      Neck Exercises: Machines for Strengthening   UBE (Upper Arm Bike)  L1.5 x 6 min (3" fwd/3"back)      Neck Exercises: Theraband   Scapula Retraction  10 reps   yellow TB   Scapula Retraction Limitations  + mini-shoulder extension in standing    Rows  10 reps   yellow TB   Rows Limitations  seated/standing      Neck Exercises: Seated   Cervical Rotation  Right;Left;10 reps    Cervical Rotation Limitations  SNAGS with towel - cues to avoid pulling head forward into neck flexion/protraction    Money  15 reps;3 secs    Money Limitations  cues for cervical & scapular retraction     Other Seated Exercise  Cervical extension with towel support 15 x 3 sec      Manual Therapy   Manual Therapy  Soft tissue mobilization;Myofascial release;Passive ROM    Manual therapy comments  hooklying    Soft tissue  mobilization  B UT, LS, SCM, scalenes & cervical paraspinals (R>L)    Myofascial Release  manual TPR to R UT & SCM    Passive ROM  gentle cervical PROM in all planes to tolerance with manual UT & SCM stretches 2 x30 sec      Neck Exercises: Stretches   Upper Trapezius Stretch  Right;Left;30 seconds;1 rep    Upper Trapezius Stretch Limitations  PT assist to prevent neck flexion    Levator Stretch  Right;Left;30 seconds;1 rep             PT Education - 05/23/18 1453    Education Details  HEP update - Rows/retraction with yellow TB    Person(s) Educated  Patient    Methods  Explanation;Demonstration;Handout    Comprehension  Verbalized understanding;Returned demonstration;Need further instruction       PT Short Term Goals - 05/23/18 1505      PT SHORT TERM GOAL #1   Title  Independent with intial HEP    Status  Achieved      PT SHORT TERM GOAL #2   Title  Patient to demonstrate appropriate posture and body mechanics needed for daily activities    Status  On-going        PT Long Term Goals - 05/18/18 1545      PT LONG TERM GOAL #1   Title  Independent with ongoing/advanced HEP    Status  On-going      PT LONG TERM GOAL #2   Title  Patient to improve cervical AROM to [facility] without pain provocation     Status  On-going      PT LONG TERM GOAL #3   Title  B shoulder strength >/= 4+/5 for improved postural stability    Status  On-going      PT LONG TERM GOAL #4   Title  Patient to report ability to perform ADLs and daily household tasks without increased pain    Status  On-going            Plan - 05/23/18 1453    Clinical Impression Statement  Pat reporting good compliance with HEP, sometimes completing HEP a 3rd time during the day as she feels like exercises  are helping - STG #1 met.. Limited tolerance for supine/hooklying position today due to increased coughing from post-nasal drip - pt also noting increased SOB over past few days (VS WNL when checked) and has  an appt with her PCP following PT visit today to have this looked at. Pt with increased difficulty maintaining upright head position with seated exercises but good tolerance for addition of yellow TB resistance to scapular retraction as she feels this motion helps her lift her head up - HEP updated to inlcude yellow TB rows and retraction.    Rehab Potential  Good    PT Treatment/Interventions  Patient/family education;Neuromuscular re-education;Therapeutic exercise;Therapeutic activities;Functional mobility training;Electrical Stimulation;Moist Heat;Ultrasound;Iontophoresis 19m/ml Dexamethasone;Manual techniques;Passive range of motion;Dry needling;Taping;ADLs/Self Care Home Management    PT Next Visit Plan  Postural education; Gentle cervical ROM and stretching; Postural strengthening; Manual therapy and modalities as indicated    Consulted and Agree with Plan of Care  Patient       Patient will benefit from skilled therapeutic intervention in order to improve the following deficits and impairments:  Pain, Impaired flexibility, Increased muscle spasms, Decreased range of motion, Decreased strength, Postural dysfunction, Improper body mechanics, Impaired UE functional use, Decreased activity tolerance  Visit Diagnosis: Cervicalgia  Abnormal posture  Muscle weakness (generalized)     Problem List Patient Active Problem List   Diagnosis Date Noted  . Edema of both lower extremities 04/18/2018  . Chronic migraine 11/15/2017  . Memory loss 11/15/2017  . Osteoporosis 07/09/2017  . CKD (chronic kidney disease) stage 2, GFR 60-89 ml/min 01/06/2016  . Abdominal pain 09/10/2015  . Pulmonary nodules/lesions, multiple 09/10/2015  . Small bowel obstruction, partial ([facility] 09/10/2015  . Arthritis 09/10/2015  . Partial small bowel obstruction ([facility] 09/10/2015  . PCP NOTES >>>>>> 04/17/2015  . Hemorrhoids 09/17/2011  . Annual physical exam 03/19/2011  . DJD -back pain-on Celebrex   . Cough  09/25/2010  . Constipation 01/21/2010  . DIVERTICULOSIS OF COLON 09/02/2009  . PERSONAL HISTORY OF COLONIC POLYPS 09/02/2009  . FULL INCONTINENCE OF FECES 09/02/2009  . Essential hypertension 08/10/2007  . Headache 08/10/2007  . Pulmonary nodules 04/25/2007  . Anxiety state 02/03/2007  . Depression 02/03/2007  . KNEE PAIN, RIGHT, CHRONIC 02/03/2007  . Hyperlipidemia 04/26/2006  . DIZZINESS 04/26/2006  . SVT (supraventricular tachycardia) ([facility] 04/26/2006    JPercival Spanish PT, MPT 05/23/2018, 3:13 PM  [facility] [place] [address]Phone: [phone]  Fax:  [phone] Name: PARITZEL KRUSEMARKMRN: [id number] of Birth: [birth year]

## 2018-05-23 NOTE — Progress Notes (Signed)
Pre visit review using our clinic review tool, if applicable. No additional management support is needed unless otherwise documented below in the visit note. 

## 2018-05-24 ENCOUNTER — Other Ambulatory Visit: Payer: Self-pay

## 2018-05-24 DIAGNOSIS — R0602 Shortness of breath: Secondary | ICD-10-CM

## 2018-05-24 LAB — BASIC METABOLIC PANEL
BUN: 21 mg/dL (ref 6–23)
CALCIUM: 9.6 mg/dL (ref 8.4–10.5)
CO2: 25 meq/L (ref 19–32)
Chloride: 98 mEq/L (ref 96–112)
Creatinine, Ser: 1.1 mg/dL (ref 0.40–1.20)
GFR: 47.06 mL/min — AB (ref >60.00)
GLUCOSE: 90 mg/dL (ref 70–99)
Potassium: 4.5 mEq/L (ref 3.5–5.1)
SODIUM: 136 meq/L (ref 135–145)

## 2018-05-24 LAB — BRAIN NATRIURETIC PEPTIDE: Pro B Natriuretic peptide (BNP): 27 pg/mL (ref 0.0–100.0)

## 2018-05-24 LAB — CBC WITH DIFFERENTIAL/PLATELET
BASOS ABS: 0.1 10*3/uL (ref 0.0–0.1)
BASOS PCT: 0.8 % (ref 0.0–3.0)
Eosinophils Absolute: 0.1 10*3/uL (ref 0.0–0.7)
Eosinophils Relative: 1.7 % (ref 0.0–5.0)
HCT: 36.4 % (ref 36.0–46.0)
Hemoglobin: 12.2 g/dL (ref 12.0–15.0)
LYMPHS ABS: 1.7 10*3/uL (ref 0.7–4.0)
LYMPHS PCT: 20.2 % (ref 12.0–46.0)
MCHC: 33.6 g/dL (ref 30.0–36.0)
MCV: 96.6 fl (ref 78.0–100.0)
MONOS PCT: 10 % (ref 3.0–12.0)
Monocytes Absolute: 0.8 10*3/uL (ref 0.1–1.0)
NEUTROS ABS: 5.6 10*3/uL (ref 1.4–7.7)
Neutrophils Relative %: 67.3 % (ref 43.0–77.0)
PLATELETS: 324 10*3/uL (ref 150.0–400.0)
RBC: 3.77 Mil/uL — ABNORMAL LOW (ref 3.87–5.11)
RDW: 13.7 % (ref 11.5–15.5)
WBC: 8.3 10*3/uL (ref 4.0–10.5)

## 2018-05-24 NOTE — Assessment & Plan Note (Signed)
DOE: Presents with increased DOE for the last week, on physical exam there a mild JVD elevation, very mild lower extremity edema, she reports some cough but lung exam benign. Recently seen by cardiology for elevated BP, echocardiogram satisfactory, proBNP normal.  Medications were adjusted, CCBs stopped,  telmisartan HCTZ started. EKG today: Normal sinus rhythm Ambulating O2 sat: O2 sat decreased from 96 to 92% with ambulation. Overall, etiology of worsening DOE unclear.  We will check a BMP, CBC, proBNP and a chest x-ray.  Further advised with results. Addendum: Labs, chest x-ray okay.  To be sure will check a d-dimer.

## 2018-05-25 ENCOUNTER — Other Ambulatory Visit (INDEPENDENT_AMBULATORY_CARE_PROVIDER_SITE_OTHER): Payer: Medicare Other

## 2018-05-25 ENCOUNTER — Ambulatory Visit: Payer: Medicare Other | Admitting: Physical Therapy

## 2018-05-25 ENCOUNTER — Encounter (HOSPITAL_BASED_OUTPATIENT_CLINIC_OR_DEPARTMENT_OTHER): Payer: Self-pay

## 2018-05-25 ENCOUNTER — Encounter: Payer: Self-pay | Admitting: Physical Therapy

## 2018-05-25 ENCOUNTER — Telehealth: Payer: Self-pay | Admitting: Internal Medicine

## 2018-05-25 ENCOUNTER — Ambulatory Visit (HOSPITAL_BASED_OUTPATIENT_CLINIC_OR_DEPARTMENT_OTHER)
Admission: RE | Admit: 2018-05-25 | Discharge: 2018-05-25 | Disposition: A | Discharge status: Home / Self Care | Payer: Medicare Other | Source: Ambulatory Visit | Attending: Internal Medicine | Admitting: Internal Medicine

## 2018-05-25 VITALS — BP 132/70 | HR 64

## 2018-05-25 DIAGNOSIS — M542 Cervicalgia: Secondary | ICD-10-CM

## 2018-05-25 DIAGNOSIS — R0602 Shortness of breath: Secondary | ICD-10-CM | POA: Insufficient documentation

## 2018-05-25 DIAGNOSIS — R293 Abnormal posture: Secondary | ICD-10-CM

## 2018-05-25 DIAGNOSIS — M6281 Muscle weakness (generalized): Secondary | ICD-10-CM

## 2018-05-25 LAB — D-DIMER, QUANTITATIVE (NOT AT ARMC): D DIMER QUANT: 0.9 ug{FEU}/mL — AB (ref <0.50)

## 2018-05-25 MED ORDER — IOPAMIDOL (ISOVUE-370) INJECTION 76%
100.0000 mL | Freq: Once | INTRAVENOUS | Status: AC | PRN
  Administered 2018-05-25: 100 mL via INTRAVENOUS

## 2018-05-25 NOTE — Telephone Encounter (Signed)
Advise patient, blood test came back positive. Please enter order for a CT chest angiogram to be done ASAP, DX DOE, + d-dimer.

## 2018-05-25 NOTE — Telephone Encounter (Signed)
Patient notified of labs and advised that she should be getting a call for ct scan.

## 2018-05-25 NOTE — Therapy (Signed)
[facility] [address]  [place], [place], [address] Phone: [phone]   Fax:  [phone]  Physical Therapy Treatment  Patient Details  Name: [name] MRN: [id number] Date of Birth: Sep 03, 1929 Referring Provider (PT): Karlton Lemon, MD   Encounter Date: 05/25/2018  PT End of Session - 05/25/18 0900    Visit Number  5    Number of Visits  12    Date for PT Re-Evaluation  06/20/18    Authorization Type  [employer] Medicare    PT Start Time  0900    PT Stop Time  0957    PT Time Calculation (min)  57 min    Activity Tolerance  Patient tolerated treatment well;No increased pain    Behavior During Therapy  WFL for tasks assessed/performed       Past Medical History:  Diagnosis Date  . Anxiety and depression   . Back pain    lumbar  . Fecal incontinence    Dr. Michail Sermon, likely due to loose stools in conjunction with pelvic muscle laxity from aging, recommended to stop Miralax  . GI (gastrointestinal bleed)    hx of colon polyps 2004, diverticulosis, internal hemmorhids  . Headache   . Headache(784.0)   . Hydronephrosis    Right 2008 s/p urology eval CT/cysto: observation/monitor  . Hyperlipidemia   . Hypertension   . Knee pain    right chronic  . Migraine    on topamax , per neuro  . Osteoporosis   . Pulmonary nodules    Wert. First seen by CT only 05/04/03, no change on f/u 08/08/06 -Macroscopic changes rml 04/27/07 > resolved april 6,2010 no further w/u   . Seborrheic keratosis    Left inferior posterior flank  . SVT (supraventricular tachycardia) (HCC)    sees Dr.Ross    Past Surgical History:  Procedure Laterality Date  . ABDOMINAL HYSTERECTOMY    . APPENDECTOMY    . BASAL CELL CARCINOMA EXCISION    . BLADDER REPAIR    . CATARACT EXTRACTION  2008   Dr.Digby  . CHOLECYSTECTOMY    . lumbar reconstructive surgery  01/07/2009  . R ovary removed    . ROTATOR CUFF REPAIR  9/-2011  . SHOULDER  SURGERY  12-2011   LEFT  . TONSILLECTOMY    . TOTAL KNEE ARTHROPLASTY     right  . trigger finger surgery     L thumb (2014), R ring finger 05-2013    Vitals:   05/25/18 0906  BP: 132/70  Pulse: 64  SpO2: 92%    Subjective Assessment - 05/25/18 0908    Subjective  Pt reporting trouble holding head up than pain today. States MD work-up for SOB and LE swelling negative so far - had blood tests to check for clots this morning.    Pertinent History  R TKR; B RTC repair    Diagnostic tests  Neck x-ray 04/20/18 - 1. No cervical spine fracture or acute malalignment; 2. Reversal of the normal cervical lordosis, usually due to positioning and/or muscle spasm; 3. Moderate multilevel cervical degenerative disc disease, most prominent at C2-3; 4. Mild degenerative foraminal stenosis on the right at C4-5.    Patient Stated Goals  "able to move my head any way I want to w/o extra pain"    Currently in Pain?  No/denies  [facility] Adult PT Treatment/Exercise - 05/25/18 0900      Exercises   Exercises  Neck      Neck Exercises: Machines for Strengthening   UBE (Upper Arm Bike)  L1.0 x 6 min (3" fwd/3"back)      Neck Exercises: Theraband   Scapula Retraction  10 reps   yellow TB   Scapula Retraction Limitations  + mini-shoulder extension in standing    Rows  10 reps   yellow TB   Rows Limitations  standing    Shoulder External Rotation  10 reps   yellow TB   Shoulder External Rotation Limitations  hookying, 3-5 sec hold with cues for scap retraction    Horizontal ABduction  10 reps   yellow TB   Horizontal ABduction Limitations  hookying, 3-5 sec hold with cues for scap retraction    Other Theraband Exercises  Alt scap retraction + UE diagonals with yellow TB in hooklying 10 x 3"      Neck Exercises: Supine   Neck Retraction  10 reps;5 secs    Neck Retraction Limitations  into thin pillow with head of mat table lowered to allow slight cervical extension       Modalities   Modalities  Moist Heat      Moist Heat Therapy   Number Minutes Moist Heat  15 Minutes    Moist Heat Location  Cervical   posterior & R side of neck     Manual Therapy   Manual Therapy  Soft tissue mobilization;Myofascial release;Passive ROM;Manual Traction    Manual therapy comments  hooklying    Soft tissue mobilization  B UT, LS, SCM, scalenes & cervical paraspinals (R>L)    Myofascial Release  manual TPR to R UT, scalenes & SCM    Passive ROM  gentle cervical PROM in all planes to tolerance with manual UT & SCM stretches 2 x30 sec    Manual Traction  very gentle manual distraction 3 x 30 sec               PT Short Term Goals - 05/23/18 1505      PT SHORT TERM GOAL #1   Title  Independent with intial HEP    Status  Achieved      PT SHORT TERM GOAL #2   Title  Patient to demonstrate appropriate posture and body mechanics needed for daily activities    Status  On-going        PT Long Term Goals - 05/18/18 1545      PT LONG TERM GOAL #1   Title  Independent with ongoing/advanced HEP    Status  On-going      PT LONG TERM GOAL #2   Title  Patient to improve cervical AROM to [facility] without pain provocation     Status  On-going      PT LONG TERM GOAL #3   Title  B shoulder strength >/= 4+/5 for improved postural stability    Status  On-going      PT LONG TERM GOAL #4   Title  Patient to report ability to perform ADLs and daily household tasks without increased pain    Status  On-going            Plan - 05/25/18 0945    Clinical Impression Statement  Pat reporting less pain today, more just a feeling of heaviness and difficulty holding her head up. MD work-up for SOB still in progress but negative so far and  VS WNL today. Pt better able to tolerate supine/hooklying position today allowing for more STM to areas of tightness & ttp in R scalenes, SCM and UT as well as passive stretching and postural strengthening. Pt noting improved ability to  hold head up following manual therapy.    Rehab Potential  Good    PT Treatment/Interventions  Patient/family education;Neuromuscular re-education;Therapeutic exercise;Therapeutic activities;Functional mobility training;Electrical Stimulation;Moist Heat;Ultrasound;Iontophoresis 4mg /ml Dexamethasone;Manual techniques;Passive range of motion;Dry needling;Taping;ADLs/Self Care Home Management    PT Next Visit Plan  Postural education; Gentle cervical ROM and stretching; Postural strengthening; Manual therapy and modalities as indicated    Consulted and Agree with Plan of Care  Patient       Patient will benefit from skilled therapeutic intervention in order to improve the following deficits and impairments:  Pain, Impaired flexibility, Increased muscle spasms, Decreased range of motion, Decreased strength, Postural dysfunction, Improper body mechanics, Impaired UE functional use, Decreased activity tolerance  Visit Diagnosis: Cervicalgia  Abnormal posture  Muscle weakness (generalized)     Problem List Patient Active Problem List   Diagnosis Date Noted  . Edema of both lower extremities 04/18/2018  . Chronic migraine 11/15/2017  . Memory loss 11/15/2017  . Osteoporosis 07/09/2017  . CKD (chronic kidney disease) stage 2, GFR 60-89 ml/min 01/06/2016  . Abdominal pain 09/10/2015  . Pulmonary nodules/lesions, multiple 09/10/2015  . Small bowel obstruction, partial ([place]) 09/10/2015  . Arthritis 09/10/2015  . Partial small bowel obstruction ([place]) 09/10/2015  . PCP NOTES >>>>>> 04/17/2015  . Hemorrhoids 09/17/2011  . Annual physical exam 03/19/2011  . DJD -back pain-on Celebrex   . Cough 09/25/2010  . Constipation 01/21/2010  . DIVERTICULOSIS OF COLON 09/02/2009  . PERSONAL HISTORY OF COLONIC POLYPS 09/02/2009  . FULL INCONTINENCE OF FECES 09/02/2009  . Essential hypertension 08/10/2007  . Headache 08/10/2007  . Pulmonary nodules 04/25/2007  . Anxiety state 02/03/2007  .  Depression 02/03/2007  . KNEE PAIN, RIGHT, CHRONIC 02/03/2007  . Hyperlipidemia 04/26/2006  . DIZZINESS 04/26/2006  . SVT (supraventricular tachycardia) ([place]) 04/26/2006    [name], PT, MPT 05/25/2018, 9:59 AM  [facility] [address]  [place], [place], [address] Phone: [phone]   Fax:  [phone]  Name: [name] MRN: [id number] Date of Birth: 08/12/1929

## 2018-05-26 ENCOUNTER — Other Ambulatory Visit: Payer: Self-pay | Admitting: *Deleted

## 2018-05-26 DIAGNOSIS — R0609 Other forms of dyspnea: Principal | ICD-10-CM

## 2018-05-26 DIAGNOSIS — R0600 Dyspnea, unspecified: Secondary | ICD-10-CM

## 2018-05-30 ENCOUNTER — Encounter: Payer: Self-pay | Admitting: Physical Therapy

## 2018-05-30 ENCOUNTER — Telehealth: Payer: Self-pay | Admitting: *Deleted

## 2018-05-30 ENCOUNTER — Ambulatory Visit: Payer: Medicare Other | Admitting: Physical Therapy

## 2018-05-30 DIAGNOSIS — R293 Abnormal posture: Secondary | ICD-10-CM

## 2018-05-30 DIAGNOSIS — M542 Cervicalgia: Secondary | ICD-10-CM

## 2018-05-30 DIAGNOSIS — M6281 Muscle weakness (generalized): Secondary | ICD-10-CM

## 2018-05-30 NOTE — Telephone Encounter (Signed)
Received Lab Report results from [facility]; forwarded to provider/SLS  02/11

## 2018-05-30 NOTE — Patient Instructions (Signed)

## 2018-05-30 NOTE — Therapy (Signed)
[facility] [address]  [place], [place], [address] Phone: [phone]   Fax:  [phone]  Physical Therapy Treatment  Patient Details  Name: [name] MRN: [id number] Date of Birth: 1929/12/06 Referring Provider (PT): Karlton Lemon, MD   Encounter Date: 05/30/2018  PT End of Session - 05/30/18 1359    Visit Number  6    Number of Visits  12    Date for PT Re-Evaluation  06/20/18    Authorization Type  [employer] Medicare    PT Start Time  3428    PT Stop Time  1443    PT Time Calculation (min)  44 min    Activity Tolerance  Patient tolerated treatment well    Behavior During Therapy  [facility] for tasks assessed/performed       Past Medical History:  Diagnosis Date  . Anxiety and depression   . Back pain    lumbar  . Fecal incontinence    Dr. Michail Sermon, likely due to loose stools in conjunction with pelvic muscle laxity from aging, recommended to stop Miralax  . GI (gastrointestinal bleed)    hx of colon polyps 2004, diverticulosis, internal hemmorhids  . Headache   . Headache(784.0)   . Hydronephrosis    Right 2008 s/p urology eval CT/cysto: observation/monitor  . Hyperlipidemia   . Hypertension   . Knee pain    right chronic  . Migraine    on topamax , per neuro  . Osteoporosis   . Pulmonary nodules    [place]. First seen by CT only 05/04/03, no change on f/u 08/08/06 -Macroscopic changes rml 04/27/07 > resolved april 6,2010 no further w/u   . Seborrheic keratosis    Left inferior posterior flank  . SVT (supraventricular tachycardia) (HCC)    sees Dr.Ross    Past Surgical History:  Procedure Laterality Date  . ABDOMINAL HYSTERECTOMY    . APPENDECTOMY    . BASAL CELL CARCINOMA EXCISION    . BLADDER REPAIR    . CATARACT EXTRACTION  2008   Dr.Digby  . CHOLECYSTECTOMY    . lumbar reconstructive surgery  01/07/2009  . R ovary removed    . ROTATOR CUFF REPAIR  9/-2011  . SHOULDER SURGERY  12-2011    LEFT  . TONSILLECTOMY    . TOTAL KNEE ARTHROPLASTY     right  . trigger finger surgery     L thumb (2014), R ring finger 05-2013    There were no vitals filed for this visit.  Subjective Assessment - 05/30/18 1400    Subjective  Pt reports her neck is about the same today. Had some soreness after last session but no pain today. Still hard to hold her head up.    Pertinent History  R TKR; B RTC repair    Diagnostic tests  Neck x-ray 04/20/18 - 1. No cervical spine fracture or acute malalignment; 2. Reversal of the normal cervical lordosis, usually due to positioning and/or muscle spasm; 3. Moderate multilevel cervical degenerative disc disease, most prominent at C2-3; 4. Mild degenerative foraminal stenosis on the right at C4-5.    Patient Stated Goals  "able to move my head any way I want to w/o extra pain"    Currently in Pain?  No/denies                       [facility] Adult PT Treatment/Exercise - 05/30/18 1359  Self-Care   Self-Care  Posture    Posture  Provided instruction in good neck and spine posture with daily tasks.      Exercises   Exercises  Neck      Neck Exercises: Machines for Strengthening   UBE (Upper Arm Bike)  L1.0 x 6 min (3" fwd/3"back)    Lat Pull  10# x 10      Neck Exercises: Theraband   Shoulder External Rotation  10 reps   yellow TB   Shoulder External Rotation Limitations  seated in chair with back support, 3 sec hold with cues for scap retraction & upright head posture    Horizontal ABduction  10 reps   yellow TB   Horizontal ABduction Limitations  seated in chair with back support, 3-5 sec hold with cues for scap retraction    Other Theraband Exercises  Cervical extension into yellow TB 10 x 3"      Neck Exercises: Seated   Neck Retraction  10 reps;5 secs    Neck Retraction Limitations  yellow TB resistance    Other Seated Exercise  Cervical extension with towel support 15 x 3 sec             PT Education - 05/30/18 1406     Education Details  Posture & body mechanics with daily tasks    Person(s) Educated  Patient    Methods  Explanation;Demonstration;Handout    Comprehension  Verbalized understanding       PT Short Term Goals - 05/23/18 1505      PT SHORT TERM GOAL #1   Title  Independent with intial HEP    Status  Achieved      PT SHORT TERM GOAL #2   Title  Patient to demonstrate appropriate posture and body mechanics needed for daily activities    Status  On-going        PT Long Term Goals - 05/18/18 1545      PT LONG TERM GOAL #1   Title  Independent with ongoing/advanced HEP    Status  On-going      PT LONG TERM GOAL #2   Title  Patient to improve cervical AROM to [facility] without pain provocation     Status  On-going      PT LONG TERM GOAL #3   Title  B shoulder strength >/= 4+/5 for improved postural stability    Status  On-going      PT LONG TERM GOAL #4   Title  Patient to report ability to perform ADLs and daily household tasks without increased pain    Status  On-going            Plan - 05/30/18 1405    Clinical Impression Statement  Fraser Din noting some soreness following last session but no pain today, only the "heaviness" making if difficult to hold her head up. Focused on improving postural control of head/neck against gravity today with pt demostrating better control when vertical but unable to lift head against gravity when forward flexed at trunk. Reinforced postural awareness for maintenance of neutral cevical spine and head posture with education/training in proper body mechanics with typical daily activities.    Rehab Potential  Good    PT Treatment/Interventions  Patient/family education;Neuromuscular re-education;Therapeutic exercise;Therapeutic activities;Functional mobility training;Electrical Stimulation;Moist Heat;Ultrasound;Iontophoresis 4mg /ml Dexamethasone;Manual techniques;Passive range of motion;Dry needling;Taping;ADLs/Self Care Home Management    PT Next Visit  Plan  Gentle cervical ROM and stretching; Postural strengthening; Manual therapy and modalities as indicated  Consulted and Agree with Plan of Care  Patient       Patient will benefit from skilled therapeutic intervention in order to improve the following deficits and impairments:  Pain, Impaired flexibility, Increased muscle spasms, Decreased range of motion, Decreased strength, Postural dysfunction, Improper body mechanics, Impaired UE functional use, Decreased activity tolerance  Visit Diagnosis: Cervicalgia  Abnormal posture  Muscle weakness (generalized)     Problem List Patient Active Problem List   Diagnosis Date Noted  . Edema of both lower extremities 04/18/2018  . Chronic migraine 11/15/2017  . Memory loss 11/15/2017  . Osteoporosis 07/09/2017  . CKD (chronic kidney disease) stage 2, GFR 60-89 ml/min 01/06/2016  . Abdominal pain 09/10/2015  . Pulmonary nodules/lesions, multiple 09/10/2015  . Small bowel obstruction, partial ([place]) 09/10/2015  . Arthritis 09/10/2015  . Partial small bowel obstruction ([place]) 09/10/2015  . PCP NOTES >>>>>> 04/17/2015  . Hemorrhoids 09/17/2011  . Annual physical exam 03/19/2011  . DJD -back pain-on Celebrex   . Cough 09/25/2010  . Constipation 01/21/2010  . DIVERTICULOSIS OF COLON 09/02/2009  . PERSONAL HISTORY OF COLONIC POLYPS 09/02/2009  . FULL INCONTINENCE OF FECES 09/02/2009  . Essential hypertension 08/10/2007  . Headache 08/10/2007  . Pulmonary nodules 04/25/2007  . Anxiety state 02/03/2007  . Depression 02/03/2007  . KNEE PAIN, RIGHT, CHRONIC 02/03/2007  . Hyperlipidemia 04/26/2006  . DIZZINESS 04/26/2006  . SVT (supraventricular tachycardia) ([place]) 04/26/2006    [name], PT, MPT 05/30/2018, 6:25 PM  [facility] [address]  [place], [place], [address] Phone: [phone]   Fax:  [phone]  Name: [name] MRN: [id number] Date of  Birth: Sep 15, 1929

## 2018-06-01 ENCOUNTER — Other Ambulatory Visit: Payer: Self-pay | Admitting: Neurology

## 2018-06-02 ENCOUNTER — Ambulatory Visit: Payer: Medicare Other

## 2018-06-02 DIAGNOSIS — M542 Cervicalgia: Secondary | ICD-10-CM | POA: Diagnosis not present

## 2018-06-02 DIAGNOSIS — R293 Abnormal posture: Secondary | ICD-10-CM

## 2018-06-02 DIAGNOSIS — M6281 Muscle weakness (generalized): Secondary | ICD-10-CM

## 2018-06-02 NOTE — Therapy (Signed)
[facility] [address]  [place], [place], [address] Phone: [phone]   Fax:  [phone]  Physical Therapy Treatment  Patient Details  Name: [name] MRN: [id number] Date of Birth: [birth year] Referring Provider (PT): Karlton Lemon, MD   Encounter Date: 06/02/2018  PT End of Session - 06/02/18 1020    Visit Number  7    Number of Visits  12    Date for PT Re-Evaluation  06/20/18    Authorization Type  [employer] Medicare    PT Start Time  1791    PT Stop Time  1111   Ended visit with 10 mim  moist heat    PT Time Calculation (min)  56 min    Activity Tolerance  Patient tolerated treatment well    Behavior During Therapy  [facility] for tasks assessed/performed       Past Medical History:  Diagnosis Date  . Anxiety and depression   . Back pain    lumbar  . Fecal incontinence    Dr. Michail Sermon, likely due to loose stools in conjunction with pelvic muscle laxity from aging, recommended to stop Miralax  . GI (gastrointestinal bleed)    hx of colon polyps 2004, diverticulosis, internal hemmorhids  . Headache   . Headache(784.0)   . Hydronephrosis    Right 2008 s/p urology eval CT/cysto: observation/monitor  . Hyperlipidemia   . Hypertension   . Knee pain    right chronic  . Migraine    on topamax , per neuro  . Osteoporosis   . Pulmonary nodules    [place]. First seen by CT only 05/04/03, no change on f/u 08/08/06 -Macroscopic changes rml 04/27/07 > resolved april 6,2010 no further w/u   . Seborrheic keratosis    Left inferior posterior flank  . SVT (supraventricular tachycardia) (HCC)    sees Dr.Ross    Past Surgical History:  Procedure Laterality Date  . ABDOMINAL HYSTERECTOMY    . APPENDECTOMY    . BASAL CELL CARCINOMA EXCISION    . BLADDER REPAIR    . CATARACT EXTRACTION  2008   Dr.Digby  . CHOLECYSTECTOMY    . lumbar reconstructive surgery  01/07/2009  . R ovary removed    . ROTATOR CUFF REPAIR   9/-2011  . SHOULDER SURGERY  12-2011   LEFT  . TONSILLECTOMY    . TOTAL KNEE ARTHROPLASTY     right  . trigger finger surgery     L thumb (2014), R ring finger 05-2013    There were no vitals filed for this visit.  Subjective Assessment - 06/02/18 1019    Subjective  Pt. reporting she is "doing some of the exercise everyday".      Pertinent History  R TKR; B RTC repair    Diagnostic tests  Neck x-ray 04/20/18 - 1. No cervical spine fracture or acute malalignment; 2. Reversal of the normal cervical lordosis, usually due to positioning and/or muscle spasm; 3. Moderate multilevel cervical degenerative disc disease, most prominent at C2-3; 4. Mild degenerative foraminal stenosis on the right at C4-5.    Patient Stated Goals  "able to move my head any way I want to w/o extra pain"    Currently in Pain?  Yes    Pain Score  5     Pain Location  Neck    Pain Orientation  Lower;Right    Pain Descriptors / Indicators  Pressure    Pain Type  Acute  pain    Multiple Pain Sites  No                       [facility] Adult PT Treatment/Exercise - 06/02/18 1027      Self-Care   Self-Care  Other Self-Care Comments    Posture  Encouraged pt. to be mindful of cervical posture and encouraged upright retracted posture throughout session requiring explanation from therapist       Neck Exercises: Machines for Strengthening   UBE (Upper Arm Bike)  L1.0 x 6 min (3" fwd/3"back)      Neck Exercises: Theraband   Rows  15 reps   yellow band    Rows Limitations  staggered stance + cueing for full scapular retraction       Neck Exercises: Standing   Other Standing Exercises  Standing scapular retraction with cueing for "big movements" 5" x 10 reps     Other Standing Exercises  Reverse shoulder rolls x 15 reps       Neck Exercises: Seated   Neck Retraction  10 reps;5 secs   Cueing required for proper motion      Neck Exercises: Supine   Neck Retraction  10 reps;5 secs    Neck Retraction  Limitations  into black wedge       Moist Heat Therapy   Number Minutes Moist Heat  10 Minutes    Moist Heat Location  --   seated      Manual Therapy   Manual Therapy  Soft tissue mobilization;Myofascial release;Manual Traction;Passive ROM    Manual therapy comments  seated and hooklying     Soft tissue mobilization  B UT, LS, scalenes, cervical paraspinals     Myofascial Release  Manual TPR to R mid UT    Passive ROM  gentle cervical PROM all directions; gentle B UT, LS, SCM stretch with therapist x 30 sec each     Manual Traction  gentle manual traction provided throughout cervical PROM                PT Short Term Goals - 06/02/18 1106      PT SHORT TERM GOAL #1   Title  Independent with intial HEP    Status  Achieved      PT SHORT TERM GOAL #2   Title  Patient to demonstrate appropriate posture and body mechanics needed for daily activities    Status  On-going   06/02/18: pt. demonstrating forward head and downward gaze frequently throughout session requiring VC correction        PT Long Term Goals - 05/18/18 1545      PT LONG TERM GOAL #1   Title  Independent with ongoing/advanced HEP    Status  On-going      PT LONG TERM GOAL #2   Title  Patient to improve cervical AROM to [facility] without pain provocation     Status  On-going      PT LONG TERM GOAL #3   Title  B shoulder strength >/= 4+/5 for improved postural stability    Status  On-going      PT LONG TERM GOAL #4   Title  Patient to report ability to perform ADLs and daily household tasks without increased pain    Status  On-going            Plan - 06/02/18 1021    Clinical Impression Statement  [name] seen to start session presenting with forward  head and downward gaze requiring cueing initially for correction and frequently throughout session today.  Primary complaint was R-sided "pressure", discomfort today which improved some following manual therapy however returned following therex.   Postural/scapular strengthening therex focused on gentle cervical muscular activation for improvement in comfort and reduction in muscular tone.  Pt. requiring manual guidance from therapist with most therex activities for proper technique.  Ended visit with moist heat to cervical/upper shoulder musculature for reduction in tone and discomfort.  Pt. encouraged to end session to be mindful of standing/sitting posture as to avoid forward rounded shoulder and forward head positioning.  Pt. verbalized understanding.  Pt. to see MD after next therapy visit.      PT Treatment/Interventions  Patient/family education;Neuromuscular re-education;Therapeutic exercise;Therapeutic activities;Functional mobility training;Electrical Stimulation;Moist Heat;Ultrasound;Iontophoresis 4mg /ml Dexamethasone;Manual techniques;Passive range of motion;Dry needling;Taping;ADLs/Self Care Home Management    PT Next Visit Plan  MD note; Gentle cervical ROM and stretching; Postural strengthening; Manual therapy and modalities as indicated    Consulted and Agree with Plan of Care  Patient       Patient will benefit from skilled therapeutic intervention in order to improve the following deficits and impairments:  Pain, Impaired flexibility, Increased muscle spasms, Decreased range of motion, Decreased strength, Postural dysfunction, Improper body mechanics, Impaired UE functional use, Decreased activity tolerance  Visit Diagnosis: Cervicalgia  Abnormal posture  Muscle weakness (generalized)     Problem List Patient Active Problem List   Diagnosis Date Noted  . Edema of both lower extremities 04/18/2018  . Chronic migraine 11/15/2017  . Memory loss 11/15/2017  . Osteoporosis 07/09/2017  . CKD (chronic kidney disease) stage 2, GFR 60-89 ml/min 01/06/2016  . Abdominal pain 09/10/2015  . Pulmonary nodules/lesions, multiple 09/10/2015  . Small bowel obstruction, partial ([place]) 09/10/2015  . Arthritis 09/10/2015  . Partial  small bowel obstruction ([place]) 09/10/2015  . PCP NOTES >>>>>> 04/17/2015  . Hemorrhoids 09/17/2011  . Annual physical exam 03/19/2011  . DJD -back pain-on Celebrex   . Cough 09/25/2010  . Constipation 01/21/2010  . DIVERTICULOSIS OF COLON 09/02/2009  . PERSONAL HISTORY OF COLONIC POLYPS 09/02/2009  . FULL INCONTINENCE OF FECES 09/02/2009  . Essential hypertension 08/10/2007  . Headache 08/10/2007  . Pulmonary nodules 04/25/2007  . Anxiety state 02/03/2007  . Depression 02/03/2007  . KNEE PAIN, RIGHT, CHRONIC 02/03/2007  . Hyperlipidemia 04/26/2006  . DIZZINESS 04/26/2006  . SVT (supraventricular tachycardia) ([place]) 04/26/2006    Bess Harvest, PTA 06/02/18 11:27 AM   [facility] [address]  [place], [place], [address] Phone: [phone]   Fax:  [phone]  Name: [name] MRN: [id number] Date of Birth: 05-26-1929

## 2018-06-06 ENCOUNTER — Encounter: Payer: Self-pay | Admitting: Physical Therapy

## 2018-06-06 ENCOUNTER — Ambulatory Visit: Payer: Medicare Other | Admitting: Internal Medicine

## 2018-06-06 ENCOUNTER — Encounter: Payer: Self-pay | Admitting: Internal Medicine

## 2018-06-06 ENCOUNTER — Ambulatory Visit: Payer: Medicare Other | Admitting: Physical Therapy

## 2018-06-06 VITALS — BP 144/80 | HR 58 | Ht 64.0 in | Wt 164.0 lb

## 2018-06-06 DIAGNOSIS — R05 Cough: Secondary | ICD-10-CM

## 2018-06-06 DIAGNOSIS — R293 Abnormal posture: Secondary | ICD-10-CM

## 2018-06-06 DIAGNOSIS — M542 Cervicalgia: Secondary | ICD-10-CM

## 2018-06-06 DIAGNOSIS — R0609 Other forms of dyspnea: Secondary | ICD-10-CM | POA: Diagnosis not present

## 2018-06-06 DIAGNOSIS — R0600 Dyspnea, unspecified: Secondary | ICD-10-CM

## 2018-06-06 DIAGNOSIS — R059 Cough, unspecified: Secondary | ICD-10-CM

## 2018-06-06 DIAGNOSIS — M6281 Muscle weakness (generalized): Secondary | ICD-10-CM

## 2018-06-06 MED ORDER — FAMOTIDINE 20 MG PO TABS: ORAL_TABLET | ORAL | 11 refills | Status: DC

## 2018-06-06 MED ORDER — PANTOPRAZOLE SODIUM 40 MG PO TBEC: 40.0000 mg | DELAYED_RELEASE_TABLET | Freq: Every day | ORAL | 2 refills | Status: DC

## 2018-06-06 NOTE — Patient Instructions (Addendum)
Try zyrtec 10 mg at bedtime instead of tylenol sinus   To get the most out of exercise, you need to be continuously aware that you are short of breath, but never out of breath, for up 30 minutes daily. As you improve, it will actually be easier for you to do the same amount of exercise  in  30 minutes so always push to the level where you are short of breath.  If this does not result in gradual weight reduction then I strongly recommend you see a nutritionist with a food diary x 2 weeks so that we can work out a negative calorie balance which is universally effective in steady weight loss programs.  Pantoprazole (protonix) 40 mg   Take  30-60 min before first meal of the day and Pepcid (famotidine)  20 mg one after supper  until return to office - this is the best way to tell whether stomach acid is contributing to your problem.    GERD (REFLUX)  is an extremely common cause of respiratory symptoms just like yours , many times with no obvious heartburn at all.    It can be treated with medication, but also with lifestyle changes including elevation of the head of your bed (ideally with 6 -8inch blocks under the headboard of your bed),  Smoking cessation, avoidance of late meals, excessive alcohol, and avoid fatty foods, chocolate, peppermint, colas, red wine, and acidic juices such as orange juice.  NO MINT OR MENTHOL PRODUCTS SO NO COUGH DROPS  USE SUGARLESS CANDY INSTEAD (Jolley ranchers or Stover's or Life Savers) or even ice chips will also do - the key is to swallow to prevent all throat clearing. NO OIL BASED VITAMINS - use powdered substitutes.  Avoid fish oil when coughing.    Please schedule a follow up office visit in 6 weeks, call sooner if needed with all medications /inhalers/ solutions in hand so we can verify exactly what you are taking. This includes all medications from all doctors and over the counters with pfts on return

## 2018-06-06 NOTE — Progress Notes (Signed)
[name], female    DOB: 23-Nov-1929, 84 y.o.   MRN: [id number]   Brief patient profile:  28 yowf never smoker never breathing problems until fell tgiving 2019  and hit head in bathroom hitting on marble flooring and doe since so referred to pulmonary clinic 06/06/2018 by Dr Larose [name] p neg cardiac w/u by [facility]   Note seen pulmonary clinic  09/25/10 with "cough since 2010" with dx UACS rec gerd rx and zyrtec and improved but never completely resolved.     History of Present Illness  06/06/2018  Pulmonary/ 1st office eval/[name]  Chief Complaint  Patient presents with  . Pulmonary Consult    Referred by [name].  Pt c/o DOE with any exertion since she fell Thanksgiving 2019.    Dyspnea:  abrupt onset in nov 2019 p fell - barely now able to do HT p using HC parking = [facility] = can't walk 100 yards even at a slow pace at a flat grade s stopping due to sob   Cough: has had some pnds x decades no recent eval by allergy or ent  Sleep: in recliner ever before she fell same angle almost flat no problem with cough /wheeze or sob  SABA use: none Pos sense of globus  Prev w/u 2012 better on gerd rx/ zyrtec   No obvious day to day or daytime variability or assoc excess/ purulent sputum or mucus plugs or hemoptysis or cp or chest tightness, subjective wheeze or overt sinus or hb symptoms.   Sleeping as above  without nocturnal  or early am exacerbation  of respiratory  c/o's or need for noct saba. Also denies any obvious fluctuation of symptoms with weather or environmental changes or other aggravating or alleviating factors except as outlined above   No unusual exposure hx or h/o childhood pna/ asthma or knowledge of premature birth.  Current Allergies, Complete Past Medical History, Past Surgical History, Family History, and Social History were reviewed in [employer] record.  ROS  The following are not active complaints unless bolded Hoarseness, sore throat, dysphagia,  dental problems, itching, sneezing,  nasal congestion or discharge of excess mucus or purulent secretions, ear ache,   fever, chills, sweats, unintended wt loss or wt gain, classically pleuritic or exertional cp,  orthopnea pnd or arm/hand swelling  or leg swelling, presyncope, palpitations, abdominal pain, anorexia, nausea, vomiting, diarrhea  or change in bowel habits or change in bladder habits, change in stools or change in urine, dysuria, hematuria,  rash, arthralgias, visual complaints, headache, numbness, weakness or ataxia or problems with walking or coordination,  change in mood= anxious  or  memory.               Past Medical History:  Diagnosis Date  . Anxiety and depression   . Back pain    lumbar  . Fecal incontinence    [name], likely due to loose stools in conjunction with pelvic muscle laxity from aging, recommended to stop Miralax  . GI (gastrointestinal bleed)    hx of colon polyps 2004, diverticulosis, internal hemmorhids  . Headache   . Headache(784.0)   . Hydronephrosis    Right 2008 s/p urology eval CT/cysto: observation/monitor  . Hyperlipidemia   . Hypertension   . Knee pain    right chronic  . Migraine    on topamax , per neuro  . Osteoporosis   . Pulmonary nodules    [name]. First seen by CT only 05/04/03,  no change on f/u 08/08/06 -Macroscopic changes rml 04/27/07 > resolved april 6,2010 no further w/u   . Seborrheic keratosis    Left inferior posterior flank  . SVT (supraventricular tachycardia) ([place])    sees [name]    Outpatient Medications Prior to Visit  Medication Sig Dispense Refill  . acetaminophen (TYLENOL) 500 MG tablet Take 500 mg by mouth every 6 (six) hours as needed for moderate pain.    Marland Kitchen atorvastatin (LIPITOR) 40 MG tablet Take 1 tablet (40 mg total) by mouth at bedtime. 90 tablet 2  . butalbital-acetaminophen-caffeine (FIORICET, ESGIC) 50-325-40 MG tablet Take 1 tablet by mouth every 6 (six) hours as needed for headache. Do not refill  in less than one month, cancel previous Rx 12 tablet 5  . celecoxib (CELEBREX) 200 MG capsule Take 200 mg by mouth daily.    Marland Kitchen CRANBERRY PO Take 1 capsule by mouth daily.    Marland Kitchen gabapentin (NEURONTIN) 100 MG capsule Take 2-3 capsules (200-300 mg total) by mouth 3 (three) times daily. 810 capsule 1  . multivitamin (THERAGRAN) per tablet Take 1 tablet by mouth daily.      . NON FORMULARY Take 2 capsules by mouth 2 (two) times daily. [employer]	[place]    . Probiotic Product (PROBIOTIC DAILY PO) Take 1 capsule by mouth daily.     . propranolol ER (INDERAL LA) 60 MG 24 hr capsule Take 1 capsule (60 mg total) by mouth daily. 30 capsule 11  . telmisartan-hydrochlorothiazide (MICARDIS HCT) 40-12.5 MG tablet Take 1 tablet by mouth daily. 30 tablet 3  . trimethoprim (TRIMPEX) 100 MG tablet Take 100 mg by mouth daily.  11  . amoxicillin-clavulanate (AUGMENTIN) 875-125 MG tablet Prior to dental appts        Objective:     BP (!) 144/80 (BP Location: Left Arm, Cuff Size: Normal)   Pulse (!) 58   Ht 5\' 4"  (1.626 m)   Wt 164 lb (74.4 kg)   SpO2 98%   BMI 28.15 kg/m   SpO2: 98 % RA   Anxious but pleasant amb wf nad  HEENT: nl dentition, turbinates bilaterally, and oropharynx. Nl external ear canals without cough reflex   NECK :  without JVD/Nodes/TM/ nl carotid upstrokes bilaterally   LUNGS: no acc muscle use,  Nl contour chest which is clear to A and P bilaterally without cough on insp or exp maneuvers   CV:  RRR  no s3 or murmur or increase in P2, and no edema   ABD:  soft and nontender with nl inspiratory excursion in the supine position. No bruits or organomegaly appreciated, bowel sounds nl  MS:  Nl gait/ ext warm without deformities, calf tenderness, cyanosis or clubbing No obvious joint restrictions   SKIN: warm and dry without lesions    NEURO:  alert, approp, nl sensorium with  no motor or cerebellar deficits apparent.     I personally reviewed images and agree with radiology  impression as follows:   Chest CT 05/25/18 1. No evidence for acute pulmonary embolus. 2. Aortic atherosclerosis. 3. Previously noted pulmonary nodules appear unchanged when compared with the previous exam. There is a new cluster of nodules however within the posterolateral right lower lobe measuring up to 4 mm which are likely post infectious or inflammatory in etiology. No follow-up needed    Labs ordered/ reviewed:     Chemistry      Component Value Date/Time   NA 136 05/23/2018 1611   NA 139 05/12/2018 1225  K 4.5 05/23/2018 1611   CL 98 05/23/2018 1611   CO2 25 05/23/2018 1611   BUN 21 05/23/2018 1611   BUN 21 05/12/2018 1225   CREATININE 1.10 05/23/2018 1611      Component Value Date/Time   CALCIUM 9.6 05/23/2018 1611   ALKPHOS 84 07/06/2017 1402   AST 16 07/06/2017 1402   ALT 9 07/06/2017 1402   BILITOT 0.4 07/06/2017 1402        Lab Results  Component Value Date   WBC 8.3 05/23/2018   HGB 12.2 05/23/2018   HCT 36.4 05/23/2018   MCV 96.6 05/23/2018   PLT 324.0 05/23/2018       EOS                                                              0.Marland Kitchen1                                    06/06/2018   Lab Results  Component Value Date   DDIMER 0.90 (H) 05/25/2018      Lab Results  Component Value Date   TSH 2.280 11/15/2017     Lab Results  Component Value Date   PROBNP 27.0 05/23/2018               Assessment   Cough Onset 2010  - Improved 2012  on Zyrtec typical of upper airway cough syndrome   - worse off zyrtec / gerd rx 06/06/2018 so rec rechallenge with zyrtec/ ppi/h1hs and f/u in 6 weeks    This appears to be classic Upper airway cough syndrome (previously labeled PNDS),  is so named because it's frequently impossible to sort out how much is  CR/sinusitis with freq throat clearing (which can be related to primary GERD)   vs  causing  secondary (" extra esophageal")  GERD from wide swings in gastric pressure that occur with throat clearing,  often  promoting self use of mint and menthol lozenges that reduce the lower esophageal sphincter tone and exacerbate the problem further in a cyclical fashion.   These are the same pts (now being labeled as having "irritable larynx syndrome" by some cough centers) who not infrequently have a history of having failed to tolerate ace inhibitors,  dry powder inhalers or biphosphonates or report having atypical/extraesophageal reflux symptoms that don't respond to standard doses of PPI  and are easily confused as having aecopd or asthma flares by even experienced allergists/ pulmonologists (myself included).   >>>> Note if this were asthma it would likely be much worse while taking Inderal.   Advised pt:  The standardized cough guidelines published in Chest by Lissa Morales in 2006 are still the best available and consist of a multiple step process (up to 12!) , not a single office visit,  and are intended  to address this problem logically,  with an alogrithm dependent on response to empiric treatment at  each progressive step  to determine a specific diagnosis with  minimal addtional testing needed. Therefore if adherence is an issue or can't be accurately verified,  it's very unlikely the standard evaluation and treatment will be successful here.    Furthermore, response to therapy (other  than acute cough suppression, which should only be used short term with avoidance of narcotic containing cough syrups if possible), can be a gradual process for which the patient is not likely to  perceive immediate benefit.  Unlike going to an eye doctor where the best perscription is almost always the first one and is immediately effective, this is almost never the case in the management of chronic cough syndromes. Therefore the patient needs to commit up front to consistently adhere to recommendations  for up to 6 weeks of therapy directed at the likely underlying problem(s) before the response can be reasonably  evaluated.      DOE (dyspnea on exertion) Onset 02/2018 p fell in bathroom - Echo 05/02/2018 Left ventricle: The cavity size was normal. Wall thickness was   normal. Systolic function was normal. The estimated ejection   fraction was in the range of 55% to 60%. Wall motion was normal;   there were no regional wall motion abnormalities. Doppler   parameters are consistent with abnormal left ventricular   relaxation (grade 1 diastolic dysfunction). - Mitral valve: Mildly calcified annulus. There was mild   regurgitation./ LA size nl  - 06/06/2018   Walked RA x one lap =  approx 250 ft - stopped due to  Sob at faster than avg pace, no desats   Symptoms are markedly disproportionate to objective findings and not clear to what extent this is actually a pulmonary  problem but pt does appear to have difficult to sort out respiratory symptoms of unknown origin for which  DDX  = almost all start with A and  include Adherence, Ace Inhibitors, Acid Reflux, Active Sinus Disease, Alpha 1 Antitripsin deficiency, Anxiety masquerading as Airways dz,  ABPA,  Allergy(esp in young), Aspiration (esp in elderly), Adverse effects of meds,  Active smoking or Vaping, A bunch of PE's/clot burden (a few small clots can't cause this syndrome unless there is already severe underlying pulm or vascular dz with poor reserve),  Anemia or thyroid disorder, plus two Bs  = Bronchiectasis and Beta blocker use..and one C= CHF     Adherence is always the initial "prime suspect" and is a multilayered concern that requires a "trust but verify" approach in every patient - starting with knowing how to use medications, especially inhalers, correctly, keeping up with refills and understanding the fundamental difference between maintenance and prns vs those medications only taken for a very short course and then stopped and not refilled.  - return with all meds in hand using a trust but verify approach to confirm accurate Medication   Reconciliation The principal here is that until we are certain that the  patients are doing what we've asked, it makes no sense to ask them to do more.   ? Acid (or non-acid) GERD > always difficult to exclude as up to 75% of pts in some series report no assoc GI/ Heartburn symptoms> rec max (24h)  acid suppression and diet restrictions/ reviewed and instructions given in writing.   ? Anxiety > usually at the bottom of this list of usual suspects but should be   higher on this pt's based on H and P and note  may interfere with adherence and also interpretation of response or lack thereof to symptom management which can be quite subjective.   ? Allergy/ asthma > nothing to suggest though may have allergic rhinitis so consider w/u if cough persists  ? Aspiration > nothing to suggest  ? Anemia/ thyroid dz >  ruled out the former but not the latter (since onset of symptoms) > defer to dr Larose [name]  ? A bunch of PE's >  D dimer nl - while a normal  or high normal value (seen commonly in the elderly or chronically ill)  may miss small peripheral pe, the clot burden with sob is moderately high and the d dimer  has a very high neg pred value if used in this setting. CTa confirmed the d dimer was wnl for age as suspected and the published literature now confirms.  ? Beta blocker effects:  In the setting of respiratory symptoms of unknown etiology,  It would be preferable to use bystolic, the most beta -1  selective Beta blocker available in sample form, with bisoprolol the most selective generic choice  on the market, at least on a trial basis, to make sure the spillover Beta 2 effects of the less specific Beta blockers are not contributing to this patient's symptoms.  >>> since no evidence of asthma ok to continue inderal for now but low threshold to change to bisoprolol   ? CHF >  Nl bnp, echo with nl LA size rules out.    I think her main issue is conditioning and suspect that she stopped doing much walking  after she fell in November and simply needs to get back walking on a safe level area and build up to 30 minutes a day by pacing herself much better than what she demonstrated for [place] today.  F/u will be in 6 wks, sooner if needed  With full pfts on return    Total time devoted to counseling  > 50 % of initial 60 min office visit:  review case with pt/personally observe her O2 ambulation study /discussion of options/alternatives/ personally creating written customized instructions  in presence of pt  then going over those specific  Instructions directly with the pt including how to use all of the meds but in particular covering each new medication in detail and the difference between the maintenance= "automatic" meds and the prns using an action plan format for the latter (If this problem/symptom => do that organization reading Left to right).  Please see AVS from this visit for a full list of these instructions which I personally wrote for this pt and  are unique to this visit.      Christinia Gully, MD 06/06/2018

## 2018-06-06 NOTE — Therapy (Signed)
[facility] [address]  [place], [place], [address] Phone: [phone]   Fax:  [phone]  Physical Therapy Treatment  Patient Details  Name: MIDGE MOMON MRN: [id number] Date of Birth: [birth year] Referring Provider (PT): Karlton Lemon, MD  Progress Note  Reporting Period 05/09/2018 to 06/06/2018  See note below for Objective Data and Assessment of Progress/Goals.     Encounter Date: 06/06/2018  PT End of Session - 06/06/18 1400    Visit Number  8    Number of Visits  12    Date for PT Re-Evaluation  06/20/18    Authorization Type  [employer] Medicare    PT Start Time  1400    PT Stop Time  1504    PT Time Calculation (min)  64 min    Activity Tolerance  Patient tolerated treatment well    Behavior During Therapy  WFL for tasks assessed/performed       Past Medical History:  Diagnosis Date  . Anxiety and depression   . Back pain    lumbar  . Fecal incontinence    Dr. Michail Sermon, likely due to loose stools in conjunction with pelvic muscle laxity from aging, recommended to stop Miralax  . GI (gastrointestinal bleed)    hx of colon polyps 2004, diverticulosis, internal hemmorhids  . Headache   . Headache(784.0)   . Hydronephrosis    Right 2008 s/p urology eval CT/cysto: observation/monitor  . Hyperlipidemia   . Hypertension   . Knee pain    right chronic  . Migraine    on topamax , per neuro  . Osteoporosis   . Pulmonary nodules    [place]. First seen by CT only 05/04/03, no change on f/u 08/08/06 -Macroscopic changes rml 04/27/07 > resolved april 6,2010 no further w/u   . Seborrheic keratosis    Left inferior posterior flank  . SVT (supraventricular tachycardia) (HCC)    sees Dr.Ross    Past Surgical History:  Procedure Laterality Date  . ABDOMINAL HYSTERECTOMY    . APPENDECTOMY    . BASAL CELL CARCINOMA EXCISION    . BLADDER REPAIR    . CATARACT EXTRACTION  2008   Dr.Digby  . CHOLECYSTECTOMY     . lumbar reconstructive surgery  01/07/2009  . R ovary removed    . ROTATOR CUFF REPAIR  9/-2011  . SHOULDER SURGERY  12-2011   LEFT  . TONSILLECTOMY    . TOTAL KNEE ARTHROPLASTY     right  . trigger finger surgery     L thumb (2014), R ring finger 05-2013    There were no vitals filed for this visit.  Subjective Assessment - 06/06/18 1404    Subjective  Pt reporting she feels like she is not keeping her chin on her chest as much anymore, but still has difficulty holding her head upright.    Pertinent History  R TKR; B RTC repair    Diagnostic tests  Neck x-ray 04/20/18 - 1. No cervical spine fracture or acute malalignment; 2. Reversal of the normal cervical lordosis, usually due to positioning and/or muscle spasm; 3. Moderate multilevel cervical degenerative disc disease, most prominent at C2-3; 4. Mild degenerative foraminal stenosis on the right at C4-5.    Patient Stated Goals  "able to move my head any way I want to w/o extra pain"    Currently in Pain?  Yes    Pain Score  4     Pain Location  Neck    Pain Orientation  Mid;Lower    Pain Descriptors / Indicators  Dull   "strong, forceful"   Pain Type  Acute pain    Pain Frequency  Constant         [facility] PT Assessment - 06/06/18 1400      Assessment   Medical Diagnosis  B cervical & trapezius strain    Referring Provider (PT)  Karlton Lemon, MD    Onset Date/Surgical Date  03/16/18    Hand Dominance  Right    Next MD Visit  06/07/18      AROM   Cervical Flexion  52    Cervical Extension  44    Cervical - Right Side Bend  28    Cervical - Left Side Bend  27    Cervical - Right Rotation  42    Cervical - Left Rotation  44      Strength   Right Shoulder Flexion  4-/5    Right Shoulder ABduction  4/5    Right Shoulder Internal Rotation  4+/5    Right Shoulder External Rotation  4/5    Left Shoulder Flexion  4-/5    Left Shoulder ABduction  4/5    Left Shoulder Internal Rotation  4+/5    Left Shoulder External  Rotation  4/5                   [facility] Adult PT Treatment/Exercise - 06/06/18 1400      Neck Exercises: Machines for Strengthening   UBE (Upper Arm Bike)  L1.5 x 6 min (3" fwd/3"back)      Neck Exercises: Theraband   Shoulder Extension  15 reps   yellow TB   Shoulder Extension Limitations  seated with pool noodle along spine in back of chair to promote scap retraction & upright head posture    Rows  15 reps   yellow band    Rows Limitations  seated with pool noodle along spine in back of chair to promote scap retraction & upright head posture    Horizontal ABduction  10 reps   yellow TB   Horizontal ABduction Limitations  seated with pool noodle along spine in back of chair to promote scap retraction & upright head posture    Other Theraband Exercises  Alt scap retraction + UE diagonals with yellow TB x 10 each, sitting with pool noodle behind back to promote scap retraction      Neck Exercises: Seated   Cervical Isometrics  Extension;10 reps;5 secs    Cervical Isometrics Limitations  yellow TB    Neck Retraction  10 reps;5 secs    Neck Retraction Limitations  yellow TB resistance      Modalities   Modalities  Electrical Stimulation;Moist Heat      Moist Heat Therapy   Number Minutes Moist Heat  15 Minutes    Moist Heat Location  Cervical   supine     Electrical Stimulation   Electrical Stimulation Location  B cervical paraspinals & UT    Electrical Stimulation Action  IFC    Electrical Stimulation Parameters  80-150 Hz, intensity to pt tol x 15'    Electrical Stimulation Goals  Pain;Tone      Manual Therapy   Manual Therapy  Soft tissue mobilization;Myofascial release;Passive ROM    Manual therapy comments  seated    Soft tissue mobilization  B UT, LS, SCM, scalenes & cervical paraspinals    Myofascial Release  pin &  stretch to B UT & LS    Passive ROM  gentle cervical PROM in all planes to tolerance with manual UT & SCM/scalene stretches 2 x 30 sec                PT Short Term Goals - 06/06/18 1410      PT SHORT TERM GOAL #1   Title  Independent with intial HEP    Status  Achieved      PT SHORT TERM GOAL #2   Title  Patient to demonstrate appropriate posture and body mechanics needed for daily activities    Status  Partially Met        PT Long Term Goals - 06/06/18 1416      PT LONG TERM GOAL #1   Title  Independent with ongoing/advanced HEP    Status  Partially Met      PT LONG TERM GOAL #2   Title  Patient to improve cervical AROM to [facility] without pain provocation     Status  On-going      PT LONG TERM GOAL #3   Title  B shoulder strength >/= 4+/5 for improved postural stability    Status  Partially Met      PT LONG TERM GOAL #4   Title  Patient to report ability to perform ADLs and daily household tasks without increased pain    Status  On-going            Plan - 06/06/18 1411    Clinical Impression Statement  Fraser Din noting progress with PT, reporting she no longer feels as if her chin is resting on her chest all the time and no longer feels that she has to use her hands to raise her head, but still reports difficulty maintaining neutral head alignment due to muscle fatigue. Cervical AROM improving in most planes with greatest improvement in extension ROM. Mild to moderate weakness persisting in cervical, scapular and shoulder musculature limiting ability to maintain upright head posture but pt verbalizing understanding of desired posture and proper body mechanics. STG #1 met; STG #2 and LTGs #1 & 3 partially met; remaining LTGs ongoing. Given ongoing deficits, anticipate pt may require recert at end of current POC.    Rehab Potential  Good    PT Treatment/Interventions  Patient/family education;Neuromuscular re-education;Therapeutic exercise;Therapeutic activities;Functional mobility training;Electrical Stimulation;Moist Heat;Ultrasound;Iontophoresis 27m/ml Dexamethasone;Manual techniques;Passive range of  motion;Dry needling;Taping;ADLs/Self Care Home Management    PT Next Visit Plan  Gentle cervical ROM and stretching; Postural strengthening; Manual therapy and modalities as indicated    Consulted and Agree with Plan of Care  Patient       Patient will benefit from skilled therapeutic intervention in order to improve the following deficits and impairments:  Pain, Impaired flexibility, Increased muscle spasms, Decreased range of motion, Decreased strength, Postural dysfunction, Improper body mechanics, Impaired UE functional use, Decreased activity tolerance  Visit Diagnosis: Cervicalgia  Abnormal posture  Muscle weakness (generalized)     Problem List Patient Active Problem List   Diagnosis Date Noted  . Edema of both lower extremities 04/18/2018  . Chronic migraine 11/15/2017  . Memory loss 11/15/2017  . Osteoporosis 07/09/2017  . CKD (chronic kidney disease) stage 2, GFR 60-89 ml/min 01/06/2016  . Abdominal pain 09/10/2015  . Pulmonary nodules/lesions, multiple 09/10/2015  . [place] obstruction, partial [place] 09/10/2015  . Arthritis 09/10/2015  . [place] small bowel obstruction ([facility] [place] 09/10/2015  . PCP NOTES >>>>>> 04/17/2015  . Hemorrhoids 09/17/2011  . Annual  physical exam 03/19/2011  . DJD -back pain-on Celebrex   . Cough 09/25/2010  . Constipation 01/21/2010  . DIVERTICULOSIS OF COLON 09/02/2009  . PERSONAL HISTORY OF COLONIC POLYPS 09/02/2009  . FULL INCONTINENCE OF FECES 09/02/2009  . Essential hypertension 08/10/2007  . Headache 08/10/2007  . Pulmonary nodules 04/25/2007  . Anxiety state 02/03/2007  . Depression 02/03/2007  . KNEE PAIN, RIGHT, CHRONIC 02/03/2007  . Hyperlipidemia 04/26/2006  . DIZZINESS 04/26/2006  . SVT (supraventricular tachycardia) ([place]) 04/26/2006    [name], PT, MPT 06/06/2018, 7:16 PM  [facility] [address]  [place], [place], [address] Phone: [phone]    Fax:  [phone]  Name: JALEEYAH MUNCE MRN: [id number] Date of Birth: 11-Feb-1930

## 2018-06-07 ENCOUNTER — Encounter: Payer: Self-pay | Admitting: Family Medicine

## 2018-06-07 ENCOUNTER — Encounter: Payer: Self-pay | Admitting: Internal Medicine

## 2018-06-07 ENCOUNTER — Ambulatory Visit: Payer: Medicare Other | Admitting: Family Medicine

## 2018-06-07 VITALS — BP 122/75 | HR 65 | Ht 64.0 in | Wt 164.0 lb

## 2018-06-07 DIAGNOSIS — M542 Cervicalgia: Secondary | ICD-10-CM | POA: Diagnosis not present

## 2018-06-07 NOTE — Patient Instructions (Signed)
Continue with your physical therapy and home exercises. Tylenol as needed. Follow up with me as needed if you continue to improve. Call me if you're not improving over the next several weeks and we will go ahead with an MRI

## 2018-06-07 NOTE — Assessment & Plan Note (Addendum)
Onset 02/2018 p fell in bathroom - Echo 05/02/2018 Left ventricle: The cavity size was normal. Wall thickness was   normal. Systolic function was normal. The estimated ejection   fraction was in the range of 55% to 60%. Wall motion was normal;   there were no regional wall motion abnormalities. Doppler   parameters are consistent with abnormal left ventricular   relaxation (grade 1 diastolic dysfunction). - Mitral valve: Mildly calcified annulus. There was mild   regurgitation./ LA size nl  - 06/06/2018   Walked RA x one lap =  approx 250 ft - stopped due to  Sob at faster than avg pace, no desats   Symptoms are markedly disproportionate to objective findings and not clear to what extent this is actually a pulmonary  problem but pt does appear to have difficult to sort out respiratory symptoms of unknown origin for which  DDX  = almost all start with A and  include Adherence, Ace Inhibitors, Acid Reflux, Active Sinus Disease, Alpha 1 Antitripsin deficiency, Anxiety masquerading as Airways dz,  ABPA,  Allergy(esp in young), Aspiration (esp in elderly), Adverse effects of meds,  Active smoking or Vaping, A bunch of PE's/clot burden (a few small clots can't cause this syndrome unless there is already severe underlying pulm or vascular dz with poor reserve),  Anemia or thyroid disorder, plus two Bs  = Bronchiectasis and Beta blocker use..and one C= CHF     Adherence is always the initial "prime suspect" and is a multilayered concern that requires a "trust but verify" approach in every patient - starting with knowing how to use medications, especially inhalers, correctly, keeping up with refills and understanding the fundamental difference between maintenance and prns vs those medications only taken for a very short course and then stopped and not refilled.  - return with all meds in hand using a trust but verify approach to confirm accurate Medication  Reconciliation The principal here is that until we  are certain that the  patients are doing what we've asked, it makes no sense to ask them to do more.   ? Acid (or non-acid) GERD > always difficult to exclude as up to 75% of pts in some series report no assoc GI/ Heartburn symptoms> rec max (24h)  acid suppression and diet restrictions/ reviewed and instructions given in writing.   ? Anxiety > usually at the bottom of this list of usual suspects but should be   higher on this pt's based on H and P and note  may interfere with adherence and also interpretation of response or lack thereof to symptom management which can be quite subjective.   ? Allergy/ asthma > nothing to suggest though may have allergic rhinitis so consider w/u if cough persists  ? Aspiration > nothing to suggest  ? Anemia/ thyroid dz > ruled out the former but not the latter  ? A bunch of PE's >  D dimer nl - while a normal  or high normal value (seen commonly in the elderly or chronically ill)  may miss small peripheral pe, the clot burden with sob is moderately high and the d dimer  has a very high neg pred value if used in this setting. CTa confirmed the d dimer was wnl for age as suspected and the published literature now confirms.  ? Beta blocker effects:  In the setting of respiratory symptoms of unknown etiology,  It would be preferable to use bystolic, the most beta -1  selective Beta  blocker available in sample form, with bisoprolol the most selective generic choice  on the market, at least on a trial basis, to make sure the spillover Beta 2 effects of the less specific Beta blockers are not contributing to this patient's symptoms.  >>> since no evidence of asthma ok to continue inderal for now but low threshold to change to bisoprolol   ? CHF >  Nl bnp, echo with nl LA size rules out.    I think her main issue is conditioning and suspect that she stopped doing much walking after she fell in November and simply needs to get back walking on a safe level area and build  up to 30 minutes a day by pacing herself much better than what she demonstrated for [place] today.  F/u will be in 6 wks, sooner if needed     Total time devoted to counseling  > 50 % of initial 60 min office visit:  review case with pt/personally observe her O2 ambulation study /discussion of options/alternatives/ personally creating written customized instructions  in presence of pt  then going over those specific  Instructions directly with the pt including how to use all of the meds but in particular covering each new medication in detail and the difference between the maintenance= "automatic" meds and the prns using an action plan format for the latter (If this problem/symptom => do that organization reading Left to right).  Please see AVS from this visit for a full list of these instructions which I personally wrote for this pt and  are unique to this visit.

## 2018-06-07 NOTE — Assessment & Plan Note (Signed)
Onset 2010  - Improved 2012  on Zyrtec typical of upper airway cough syndrome   - worse off zyrtec / gerd rx 06/06/2018 so rec rechallenge with zyrtec/ ppi/h1hs and f/u in 6 weeks    This appears to be classic Upper airway cough syndrome (previously labeled PNDS),  is so named because it's frequently impossible to sort out how much is  CR/sinusitis with freq throat clearing (which can be related to primary GERD)   vs  causing  secondary (" extra esophageal")  GERD from wide swings in gastric pressure that occur with throat clearing, often  promoting self use of mint and menthol lozenges that reduce the lower esophageal sphincter tone and exacerbate the problem further in a cyclical fashion.   These are the same pts (now being labeled as having "irritable larynx syndrome" by some cough centers) who not infrequently have a history of having failed to tolerate ace inhibitors,  dry powder inhalers or biphosphonates or report having atypical/extraesophageal reflux symptoms that don't respond to standard doses of PPI  and are easily confused as having aecopd or asthma flares by even experienced allergists/ pulmonologists (myself included).   Note if this were asthma it would likely be much worse while taking Inderal.   Advised pt:  The standardized cough guidelines published in Chest by Lissa Morales in 2006 are still the best available and consist of a multiple step process (up to 12!) , not a single office visit,  and are intended  to address this problem logically,  with an alogrithm dependent on response to empiric treatment at  each progressive step  to determine a specific diagnosis with  minimal addtional testing needed. Therefore if adherence is an issue or can't be accurately verified,  it's very unlikely the standard evaluation and treatment will be successful here.    Furthermore, response to therapy (other than acute cough suppression, which should only be used short term with avoidance of  narcotic containing cough syrups if possible), can be a gradual process for which the patient is not likely to  perceive immediate benefit.  Unlike going to an eye doctor where the best perscription is almost always the first one and is immediately effective, this is almost never the case in the management of chronic cough syndromes. Therefore the patient needs to commit up front to consistently adhere to recommendations  for up to 6 weeks of therapy directed at the likely underlying problem(s) before the response can be reasonably evaluated.

## 2018-06-07 NOTE — Progress Notes (Signed)
PCP: Colon Branch, MD  Subjective:   HPI: Patient is a 85 y.o. female here for neck pain.  1/16: Patient reports on Thanksgiving 2019 she fell backwards and hit her head on a doorway. Did not lose consciousness. States did well initially and this took about a week before she developed pain bilateral neck and upper back. Radiates only to both shoulders posteriorly. Pain is sharp. Tried cervical collar but not the right size per patient. No numbness, tingling. No skin changes. No bowel/bladder dysfunction. Tried toradol, celebrex, zanaflex.  2/19: Patient reports slow improvement since last visit. Neck feels stronger and pain is less. Posterior shoulders don't hurt as much either. Pain level 5/10 deep posterior neck. No radiation into extremities. No numbness, tingling. No bowel/bladder dysfunction.  Past Medical History:  Diagnosis Date  . Anxiety and depression   . Back pain    lumbar  . Fecal incontinence    Dr. Michail Sermon, likely due to loose stools in conjunction with pelvic muscle laxity from aging, recommended to stop Miralax  . GI (gastrointestinal bleed)    hx of colon polyps 2004, diverticulosis, internal hemmorhids  . Headache   . Headache(784.0)   . Hydronephrosis    Right 2008 s/p urology eval CT/cysto: observation/monitor  . Hyperlipidemia   . Hypertension   . Knee pain    right chronic  . Migraine    on topamax , per neuro  . Osteoporosis   . Pulmonary nodules    [place]ert. First seen by CT only 05/04/03, no change on f/u 08/08/06 -Macroscopic changes rml 04/27/07 > resolved april 6,2010 no further w/u   . Seborrheic keratosis    Left inferior posterior flank  . SVT (supraventricular tachycardia) ([place])    sees Dr.Ross    Current Outpatient Medications on File Prior to Visit  Medication Sig Dispense Refill  . acetaminophen (TYLENOL) 500 MG tablet Take 500 mg by mouth every 6 (six) hours as needed for moderate pain.    Marland Kitchen amoxicillin-clavulanate (AUGMENTIN)  875-125 MG tablet Prior to dental appts    . atorvastatin (LIPITOR) 40 MG tablet Take 1 tablet (40 mg total) by mouth at bedtime. 90 tablet 2  . butalbital-acetaminophen-caffeine (FIORICET, ESGIC) 50-325-40 MG tablet Take 1 tablet by mouth every 6 (six) hours as needed for headache. Do not refill in less than one month, cancel previous Rx 12 tablet 5  . celecoxib (CELEBREX) 200 MG capsule Take 200 mg by mouth daily.    Marland Kitchen CRANBERRY PO Take 1 capsule by mouth daily.    . famotidine (PEPCID) 20 MG tablet One after supper 30 tablet 11  . gabapentin (NEURONTIN) 100 MG capsule Take 2-3 capsules (200-300 mg total) by mouth 3 (three) times daily. 810 capsule 1  . multivitamin (THERAGRAN) per tablet Take 1 tablet by mouth daily.      . NON FORMULARY Take 2 capsules by mouth 2 (two) times daily. Hydro Eye    . pantoprazole (PROTONIX) 40 MG tablet Take 1 tablet (40 mg total) by mouth daily. Take 30-60 min before first meal of the day 30 tablet 2  . Probiotic Product (PROBIOTIC DAILY PO) Take 1 capsule by mouth daily.     . propranolol ER (INDERAL LA) 60 MG 24 hr capsule Take 1 capsule (60 mg total) by mouth daily. 30 capsule 11  . telmisartan-hydrochlorothiazide (MICARDIS HCT) 40-12.5 MG tablet Take 1 tablet by mouth daily. 30 tablet 3  . trimethoprim (TRIMPEX) 100 MG tablet Take 100 mg by mouth daily.  11   No current facility-administered medications on file prior to visit.     Past Surgical History:  Procedure Laterality Date  . ABDOMINAL HYSTERECTOMY    . APPENDECTOMY    . BASAL CELL CARCINOMA EXCISION    . BLADDER REPAIR    . CATARACT EXTRACTION  2008   Dr.Digby  . CHOLECYSTECTOMY    . lumbar reconstructive surgery  01/07/2009  . R ovary removed    . ROTATOR CUFF REPAIR  9/-2011  . SHOULDER SURGERY  12-2011   LEFT  . TONSILLECTOMY    . TOTAL KNEE ARTHROPLASTY     right  . trigger finger surgery     L thumb (2014), R ring finger 05-2013    Allergies  Allergen Reactions  . Amitriptyline  Hcl     REACTION: hallucinations  . Carisoprodol     REACTION: nightmares,rash,tachycardia  . Codeine     REACTION: "deathly ill" nauesa, dizzy,weak  . Fosamax [Alendronate Sodium] Other (See Comments)    Dental issues  . Meloxicam     REACTION: sensitive to sun; facial redness; may cause severe abd pain  . Metronidazole     REACTION: abd pain  . Nortriptyline Hcl     REACTION: hallucinations  . Oxaprozin     REACTION: abd pain  . Promethazine Hcl     REACTION: spastic arms and legs flailing while awake and unusual behavior while sleep - walking all over house during night  . Rofecoxib     REACTION: rash  . Sumatriptan     REACTION: abdominal pain, blurry vision, drowsiness  . Tramadol Other (See Comments)    Dizziness and constipation  . Venlafaxine     Hallucinations     Social History   Socioeconomic History  . Marital status: Widowed    Spouse name: Not on file  . Number of children: 1  . Years of education: college   . Highest education level: Doctorate  Occupational History  . Occupation: retired   Scientific laboratory technician  . Financial resource strain: Not on file  . Food insecurity:    Worry: Not on file    Inability: Not on file  . Transportation needs:    Medical: Not on file    Non-medical: Not on file  Tobacco Use  . Smoking status: Never Smoker  . Smokeless tobacco: Never Used  Substance and Sexual Activity  . Alcohol use: Yes    Alcohol/week: 1.0 standard drinks    Types: 1 Glasses of wine per week    Comment: rarely, 1 glass of wine monthly  . Drug use: No  . Sexual activity: Not Currently  Lifestyle  . Physical activity:    Days per week: Not on file    Minutes per session: Not on file  . Stress: Not on file  Relationships  . Social connections:    Talks on phone: Not on file    Gets together: Not on file    Attends religious service: Not on file    Active member of club or organization: Not on file    Attends meetings of clubs or organizations:  Not on file    Relationship status: Not on file  . Intimate partner violence:    Fear of current or ex partner: Not on file    Emotionally abused: Not on file    Physically abused: Not on file    Forced sexual activity: Not on file  Other Topics Concern  . Not on file  Social History Narrative   Moved from a retirement home to a condominium 04-2013, independent living,  lives by herself , drives    Son lives in [place]   Right handed    1 cup of caffeine per day     Family History  Problem Relation Age of Onset  . Breast cancer Mother        later in life  . Diabetes Mother        late in life  . Heart disease Mother        late onset  . Lung cancer Father        apparently had TB  . Heart disease Father        late onset  . Prostate cancer Father   . Colitis Brother   . Cancer Brother   . Colon cancer Neg Hx     BP 122/75   Pulse 65   Ht 5\' 4"  (1.626 m)   Wt 164 lb (74.4 kg)   BMI 28.15 kg/m   Review of Systems: See HPI above.     Objective:  Physical Exam:  Gen: NAD, comfortable in exam room  Neck: No gross deformity, swelling, bruising. No paraspinal TTP currently.  No midline/bony TTP. ROM 20 degrees extension, full flexion, 25 bilateral rotations. BUE strength 5/5.   Sensation intact to light touch.   2+ equal reflexes in triceps, biceps, brachioradialis tendons. Negative spurlings. NV intact distal BUEs.  Assessment & Plan:  1. Neck pain - clinically improving but slowly.  She will continue with physical therapy and home exercises.  Tylenol if needed.  F/u prn but if not improving would consider MRI cervical spine.

## 2018-06-08 ENCOUNTER — Ambulatory Visit: Payer: Medicare Other

## 2018-06-08 ENCOUNTER — Telehealth: Payer: Self-pay | Admitting: Cardiology

## 2018-06-08 DIAGNOSIS — R293 Abnormal posture: Secondary | ICD-10-CM

## 2018-06-08 DIAGNOSIS — M542 Cervicalgia: Secondary | ICD-10-CM

## 2018-06-08 DIAGNOSIS — M6281 Muscle weakness (generalized): Secondary | ICD-10-CM

## 2018-06-08 NOTE — Telephone Encounter (Signed)
Left message for patient to return call.

## 2018-06-08 NOTE — Therapy (Signed)
[facility] [address]  [place], [place], [address] Phone: [phone]   Fax:  [phone]  Physical Therapy Treatment  Patient Details  Name: [name] MRN: [id number] Date of Birth: 1929/09/04 Referring Provider (PT): Karlton Lemon, MD   Encounter Date: 06/08/2018  PT End of Session - 06/08/18 1416    Visit Number  9    Number of Visits  12    Date for PT Re-Evaluation  06/20/18    Authorization Type  [employer] Medicare    PT Start Time  1404    PT Stop Time  1445    PT Time Calculation (min)  41 min    Activity Tolerance  Patient tolerated treatment well    Behavior During Therapy  [facility] for tasks assessed/performed       Past Medical History:  Diagnosis Date  . Anxiety and depression   . Back pain    lumbar  . Fecal incontinence    Dr. Michail Sermon, likely due to loose stools in conjunction with pelvic muscle laxity from aging, recommended to stop Miralax  . GI (gastrointestinal bleed)    hx of colon polyps 2004, diverticulosis, internal hemmorhids  . Headache   . Headache(784.0)   . Hydronephrosis    Right 2008 s/p urology eval CT/cysto: observation/monitor  . Hyperlipidemia   . Hypertension   . Knee pain    right chronic  . Migraine    on topamax , per neuro  . Osteoporosis   . Pulmonary nodules    [place]. First seen by CT only 05/04/03, no change on f/u 08/08/06 -Macroscopic changes rml 04/27/07 > resolved april 6,2010 no further w/u   . Seborrheic keratosis    Left inferior posterior flank  . SVT (supraventricular tachycardia) ([facility])    sees Dr.Ross    Past Surgical History:  Procedure Laterality Date  . ABDOMINAL HYSTERECTOMY    . APPENDECTOMY    . BASAL CELL CARCINOMA EXCISION    . BLADDER REPAIR    . CATARACT EXTRACTION  2008   Dr.Digby  . CHOLECYSTECTOMY    . lumbar reconstructive surgery  01/07/2009  . R ovary removed    . ROTATOR CUFF REPAIR  9/-2011  . SHOULDER SURGERY  12-2011   LEFT  . TONSILLECTOMY    . TOTAL KNEE ARTHROPLASTY     right  . trigger finger surgery     L thumb (2014), R ring finger 05-2013    There were no vitals filed for this visit.  Subjective Assessment - 06/08/18 1410    Subjective  Pt. reporting she got an answer from MD regarding her symptoms of shortness of breath and reports MD told her it was "acid reflux".      Pertinent History  R TKR; B RTC repair    Diagnostic tests  Neck x-ray 04/20/18 - 1. No cervical spine fracture or acute malalignment; 2. Reversal of the normal cervical lordosis, usually due to positioning and/or muscle spasm; 3. Moderate multilevel cervical degenerative disc disease, most prominent at C2-3; 4. Mild degenerative foraminal stenosis on the right at C4-5.    Patient Stated Goals  "able to move my head any way I want to w/o extra pain"    Currently in Pain?  Yes    Pain Score  6     Pain Location  Neck    Pain Orientation  Mid;Lower    Pain Descriptors / Indicators  Pressure    Pain  Type  Acute pain    Pain Onset  More than a month ago                       [facility] Adult PT Treatment/Exercise - 06/08/18 1430      Neck Exercises: Machines for Strengthening   UBE (Upper Arm Bike)  L1.5 x 6 min (3" fwd/3"back)      Neck Exercises: Theraband   Rows  15 reps   yellow TB    Rows Limitations  standing with cues for scapular retraction and upright posture       Neck Exercises: Seated   Cervical Rotation  Right;Left;15 reps    Cervical Rotation Limitations  with 3" at end range each way     Lateral Flexion  Right;Left;10 reps   3" hold with tactile cueing at B upper shoulder for scap.dep   Money  15 reps;3 secs    Money Limitations  light yellow TB resistance       Manual Therapy   Manual Therapy  Soft tissue mobilization;Myofascial release;Passive ROM    Manual therapy comments  seated    Soft tissue mobilization  B UT, LS, SCM, scalenes & cervical paraspinals    Myofascial Release  Manual TPR  to R UT - pt. ttp and palpable TP - good tolerance however limited response     Passive ROM  gentle cervical PROM in all motions with gentle B UE, LS, scallenes stretch x 30 sec each                PT Short Term Goals - 06/06/18 1410      PT SHORT TERM GOAL #1   Title  Independent with intial HEP    Status  Achieved      PT SHORT TERM GOAL #2   Title  Patient to demonstrate appropriate posture and body mechanics needed for daily activities    Status  Partially Met        PT Long Term Goals - 06/06/18 1416      PT LONG TERM GOAL #1   Title  Independent with ongoing/advanced HEP    Status  Partially Met      PT LONG TERM GOAL #2   Title  Patient to improve cervical AROM to [facility] without pain provocation     Status  On-going      PT LONG TERM GOAL #3   Title  B shoulder strength >/= 4+/5 for improved postural stability    Status  Partially Met      PT LONG TERM GOAL #4   Title  Patient to report ability to perform ADLs and daily household tasks without increased pain    Status  On-going            Plan - 06/08/18 1423    Clinical Impression Statement  [name] seen to start session ambulating into clinic with downward head positioning and attributes this to feeling "pressure" in posterior neck when she holds head up.  Required cueing throughout session today for upright posture and forward gaze.  Tolerated mild progression of postural strengthening with yellow TB resistance today and noted improved cervical comfort with upright head positioning following Manual therapy.  Ended visit pain free.  Reports MD told her to continue therapy "for a few more weeks" and seek MRI if not feeling improvement with therapy.      Rehab Potential  Good    PT Treatment/Interventions  Patient/family education;Neuromuscular re-education;Therapeutic  exercise;Therapeutic activities;Functional mobility training;Electrical Stimulation;Moist Heat;Ultrasound;Iontophoresis 30m/ml  Dexamethasone;Manual techniques;Passive range of motion;Dry needling;Taping;ADLs/Self Care Home Management    PT Next Visit Plan  Gentle cervical ROM and stretching; Postural strengthening; Manual therapy and modalities as indicated    Consulted and Agree with Plan of Care  Patient       Patient will benefit from skilled therapeutic intervention in order to improve the following deficits and impairments:  Pain, Impaired flexibility, Increased muscle spasms, Decreased range of motion, Decreased strength, Postural dysfunction, Improper body mechanics, Impaired UE functional use, Decreased activity tolerance  Visit Diagnosis: Cervicalgia  Abnormal posture  Muscle weakness (generalized)     Problem List Patient Active Problem List   Diagnosis Date Noted  . Edema of both lower extremities 04/18/2018  . Chronic migraine 11/15/2017  . Memory loss 11/15/2017  . Osteoporosis 07/09/2017  . CKD (chronic kidney disease) stage 2, GFR 60-89 ml/min 01/06/2016  . Abdominal pain 09/10/2015  . Pulmonary nodules/lesions, multiple 09/10/2015  . [place] bowel obstruction, partial ([place] 09/10/2015  . Arthritis 09/10/2015  . Partial small bowel obstruction ([place] 09/10/2015  . PCP NOTES >>>>>> 04/17/2015  . Hemorrhoids 09/17/2011  . Annual physical exam 03/19/2011  . DJD -back pain-on Celebrex   . Cough 09/25/2010  . DOE (dyspnea on exertion) 08/28/2010  . Constipation 01/21/2010  . DIVERTICULOSIS OF COLON 09/02/2009  . PERSONAL HISTORY OF COLONIC POLYPS 09/02/2009  . FULL INCONTINENCE OF FECES 09/02/2009  . Essential hypertension 08/10/2007  . Headache 08/10/2007  . Pulmonary nodules 04/25/2007  . Anxiety state 02/03/2007  . Depression 02/03/2007  . KNEE PAIN, RIGHT, CHRONIC 02/03/2007  . Hyperlipidemia 04/26/2006  . DIZZINESS 04/26/2006  . SVT (supraventricular tachycardia) ([place] 04/26/2006    MBess Harvest PTA 06/08/18 3:50 PM   [facility] [address] [place] [address]Phone: [phone]  Fax:  [phone] Name: Lauren BEATTIEMRN: [id number] of Birth: 102-Mar-1931

## 2018-06-09 MED ORDER — TELMISARTAN 40 MG PO TABS: 40.0000 mg | ORAL_TABLET | Freq: Every day | ORAL | 1 refills | Status: DC

## 2018-06-09 NOTE — Telephone Encounter (Signed)
Patient advised of change and a 30 day has been sent in to make sure she tolerates it. She will contact the office if she is doing well with the change and would like a 90 day supply.

## 2018-06-09 NOTE — Telephone Encounter (Signed)
OK same dose of telmisartin wo HCTZ

## 2018-06-09 NOTE — Telephone Encounter (Signed)
Patient returned your call.

## 2018-06-09 NOTE — Telephone Encounter (Signed)
Contacted patient and she needs her Telmisartan-HCTZ changed due to the HCTZ causing her Gout. Please advise what you would like her switched to.

## 2018-06-13 ENCOUNTER — Encounter: Payer: Self-pay | Admitting: Physical Therapy

## 2018-06-13 ENCOUNTER — Ambulatory Visit: Payer: Medicare Other | Admitting: Physical Therapy

## 2018-06-13 DIAGNOSIS — M6281 Muscle weakness (generalized): Secondary | ICD-10-CM

## 2018-06-13 DIAGNOSIS — M542 Cervicalgia: Secondary | ICD-10-CM | POA: Diagnosis not present

## 2018-06-13 DIAGNOSIS — R293 Abnormal posture: Secondary | ICD-10-CM

## 2018-06-13 NOTE — Therapy (Signed)
[facility] [address]  [place], [place], [address] Phone: [phone]   Fax:  [phone]  Physical Therapy Treatment / Recertification  Patient Details  Name: [name] MRN: [id number] Date of Birth: February 23, 1930 Referring Provider (PT): Karlton Lemon, MD  Progress Note  Reporting Period 05/09/2018 to 06/13/2018  See note below for Objective Data and Assessment of Progress/Goals.    Encounter Date: 06/13/2018  PT End of Session - 06/13/18 1405    Visit Number  10    Number of Visits  22    Date for PT Re-Evaluation  07/25/18    Authorization Type  [employer] Medicare    PT Start Time  8938    PT Stop Time  1448    PT Time Calculation (min)  43 min    Activity Tolerance  Patient tolerated treatment well    Behavior During Therapy  WFL for tasks assessed/performed       Past Medical History:  Diagnosis Date  . Anxiety and depression   . Back pain    lumbar  . Fecal incontinence    Dr. Michail Sermon, likely due to loose stools in conjunction with pelvic muscle laxity from aging, recommended to stop Miralax  . GI (gastrointestinal bleed)    hx of colon polyps 2004, diverticulosis, internal hemmorhids  . Headache   . Headache(784.0)   . Hydronephrosis    Right 2008 s/p urology eval CT/cysto: observation/monitor  . Hyperlipidemia   . Hypertension   . Knee pain    right chronic  . Migraine    on topamax , per neuro  . Osteoporosis   . Pulmonary nodules    Wert. First seen by CT only 05/04/03, no change on f/u 08/08/06 -Macroscopic changes rml 04/27/07 > resolved april 6,2010 no further w/u   . Seborrheic keratosis    Left inferior posterior flank  . SVT (supraventricular tachycardia) (HCC)    sees Dr.Ross    Past Surgical History:  Procedure Laterality Date  . ABDOMINAL HYSTERECTOMY    . APPENDECTOMY    . BASAL CELL CARCINOMA EXCISION    . BLADDER REPAIR    . CATARACT EXTRACTION  2008   Dr.Digby  .  CHOLECYSTECTOMY    . lumbar reconstructive surgery  01/07/2009  . R ovary removed    . ROTATOR CUFF REPAIR  9/-2011  . SHOULDER SURGERY  12-2011   LEFT  . TONSILLECTOMY    . TOTAL KNEE ARTHROPLASTY     right  . trigger finger surgery     L thumb (2014), R ring finger 05-2013    There were no vitals filed for this visit.  Subjective Assessment - 06/13/18 1409    Subjective  Pt reports she woke up feeling better able to hold her head up today.    Pertinent History  R TKR; B RTC repair    Diagnostic tests  Neck x-ray 04/20/18 - 1. No cervical spine fracture or acute malalignment; 2. Reversal of the normal cervical lordosis, usually due to positioning and/or muscle spasm; 3. Moderate multilevel cervical degenerative disc disease, most prominent at C2-3; 4. Mild degenerative foraminal stenosis on the right at C4-5.    Patient Stated Goals  "able to move my head any way I want to w/o extra pain"    Currently in Pain?  Yes    Pain Score  2     Pain Location  Neck    Pain Orientation  Mid;Lower  Pain Descriptors / Indicators  Sore    Pain Type  Acute pain    Pain Onset  More than a month ago    Pain Frequency  Intermittent         [facility] PT Assessment - 06/13/18 1405      Assessment   Medical Diagnosis  B cervical & trapezius strain    Referring Provider (PT)  Karlton Lemon, MD    Onset Date/Surgical Date  03/16/18    Hand Dominance  Right    Next MD Visit  PRN      Prior Function   Level of Independence  Independent    Vocation  Retired    Leisure  likes to play piano, play bridge, read, scrapbooking, watch TV      AROM   Cervical Flexion  52    Cervical Extension  43    Cervical - Right Side Bend  34    Cervical - Left Side Bend  28    Cervical - Right Rotation  48    Cervical - Left Rotation  52      Strength   Right Shoulder Flexion  4-/5    Right Shoulder ABduction  4/5    Right Shoulder Internal Rotation  4+/5    Right Shoulder External Rotation  4/5    Left  Shoulder Flexion  4-/5    Left Shoulder ABduction  4/5    Left Shoulder Internal Rotation  4+/5    Left Shoulder External Rotation  4/5                   [facility] Adult PT Treatment/Exercise - 06/13/18 1405      Exercises   Exercises  Neck      Neck Exercises: Machines for Strengthening   UBE (Upper Arm Bike)  L2.0 x 6 min (3" fwd/3"back)      Neck Exercises: Theraband   Shoulder Extension  15 reps;Red    Shoulder Extension Limitations  seated with pool noodle along spine in back of chair to promote scap retraction & upright head posture, cues for cervical retraction    Horizontal ABduction  15 reps;Red    Horizontal ABduction Limitations  seated with pool noodle along spine in back of chair to promote scap retraction & upright head posture    Other Theraband Exercises  Alt scap retraction + UE diagonals with red TB x 10 each  - head following upper hand, sitting with pool noodle behind back to promote scap retraction      Neck Exercises: Seated   Neck Retraction  10 reps;5 secs      Manual Therapy   Manual Therapy  Soft tissue mobilization;Myofascial release;Passive ROM    Manual therapy comments  seated    Soft tissue mobilization  B UT, LS, SCM, scalenes & cervical paraspinals    Myofascial Release  pin & stretch to B UT & LS    Passive ROM  gentle cervical PROM in all planes to tolerance with manual UT & SCM/scalene stretches 2 x 30 sec               PT Short Term Goals - 06/13/18 1411      PT SHORT TERM GOAL #1   Title  Independent with intial HEP    Status  Achieved      PT SHORT TERM GOAL #2   Title  Patient to demonstrate appropriate posture and body mechanics needed for daily activities  Status  Partially Met    Target Date  07/04/18        PT Long Term Goals - 06/13/18 1412      PT LONG TERM GOAL #1   Title  Independent with ongoing/advanced HEP    Status  Partially Met    Target Date  07/25/18      PT LONG TERM GOAL #2   Title   Patient to improve cervical AROM to [facility] without pain provocation     Status  Partially Met    Target Date  07/25/18      PT LONG TERM GOAL #3   Title  B shoulder strength >/= 4+/5 for improved postural stability    Status  Partially Met    Target Date  07/25/18      PT LONG TERM GOAL #4   Title  Patient to report ability to perform ADLs and daily household tasks without increased pain    Status  On-going    Target Date  07/25/18            Plan - 06/13/18 1412    Clinical Impression Statement  Pat reporting Dr. Barbaraann Barthel gave her the option of continuing PT vs MRI and possible injections - pt expressing desire to continue PT at present, therefore will recert for additional 2x/wk for up to 4-6 weeks. Overall improvement noted with pt able to maintain upright head posture at least 50% of the time w/o cues, with flexed head posture more prevalent with activities reaching forward but well corrected after cueing for increased awareness. Cervical ROM essentially unchanged from last week in flexion & extension, but improvement noted in lateral flexion & rotation. Mild shoulder weakness still evident but able to tolerate progression to red TB with scapular strengthening today.  STGs met except posture goal only partially met; LTGs partially me tor ongoing. [name] continues to have good potential to further benefit from skilled PT to restore functional head posture and cervical ROM as well as UE strength to allow resumption of normal daily activities w/o limitation due to neck pain.    Rehab Potential  Good    PT Frequency  2x / week    PT Duration  6 weeks    PT Treatment/Interventions  Patient/family education;Neuromuscular re-education;Therapeutic exercise;Therapeutic activities;Functional mobility training;Electrical Stimulation;Moist Heat;Ultrasound;Iontophoresis 5m/ml Dexamethasone;Manual techniques;Passive range of motion;Dry needling;Taping;ADLs/Self Care Home Management    PT Next Visit Plan   Neck FOTO; Gentle cervical ROM and stretching; Postural strengthening; Manual therapy and modalities as indicated    Consulted and Agree with Plan of Care  Patient       Patient will benefit from skilled therapeutic intervention in order to improve the following deficits and impairments:  Pain, Impaired flexibility, Increased muscle spasms, Decreased range of motion, Decreased strength, Postural dysfunction, Improper body mechanics, Impaired UE functional use, Decreased activity tolerance  Visit Diagnosis: Cervicalgia  Abnormal posture  Muscle weakness (generalized)     Problem List Patient Active Problem List   Diagnosis Date Noted  . Edema of both lower extremities 04/18/2018  . Chronic migraine 11/15/2017  . Memory loss 11/15/2017  . Osteoporosis 07/09/2017  . CKD (chronic kidney disease) stage 2, GFR 60-89 ml/min 01/06/2016  . Abdominal pain 09/10/2015  . Pulmonary nodules/lesions, multiple 09/10/2015  . Small bowel obstruction, partial ([place] 09/10/2015  . Arthritis 09/10/2015  . Partial small bowel obstruction ([facility] 09/10/2015  . PCP NOTES >>>>>> 04/17/2015  . Hemorrhoids 09/17/2011  . Annual physical exam 03/19/2011  . DJD -  back pain-on Celebrex   . Cough 09/25/2010  . DOE (dyspnea on exertion) 08/28/2010  . Constipation 01/21/2010  . DIVERTICULOSIS OF COLON 09/02/2009  . PERSONAL HISTORY OF COLONIC POLYPS 09/02/2009  . FULL INCONTINENCE OF FECES 09/02/2009  . Essential hypertension 08/10/2007  . Headache 08/10/2007  . Pulmonary nodules 04/25/2007  . Anxiety state 02/03/2007  . Depression 02/03/2007  . KNEE PAIN, RIGHT, CHRONIC 02/03/2007  . Hyperlipidemia 04/26/2006  . DIZZINESS 04/26/2006  . SVT (supraventricular tachycardia) ([place]) 04/26/2006    [name], PT, MPT 06/13/2018, 4:49 PM  [facility] [address]  [place], [place], [address] Phone: [phone]   Fax:  [phone]  Name:  [name] MRN: [id number] Date of Birth: 09/24/1929

## 2018-06-14 ENCOUNTER — Telehealth: Payer: Self-pay | Admitting: Internal Medicine

## 2018-06-14 NOTE — Telephone Encounter (Signed)
Patient states she started Protonix Feb 18th and since then she has been having a problem urinating. She states her vagina feels odd. She does state she have a hx of UTI. She states she has 1 kidney and a side effect of the protonix is that it could damage your kidney. She states prior to starting the protonix she was having to wear a pad due to urinating too much. She reports drinking a lot of water due to hx of UTI and she is taking her cranberry tablet daily. Denies burning on urination.  MW please advise. Thanks.

## 2018-06-14 NOTE — Telephone Encounter (Signed)
LMTCB

## 2018-06-15 ENCOUNTER — Ambulatory Visit: Payer: Medicare Other

## 2018-06-15 DIAGNOSIS — M542 Cervicalgia: Secondary | ICD-10-CM

## 2018-06-15 DIAGNOSIS — R293 Abnormal posture: Secondary | ICD-10-CM

## 2018-06-15 DIAGNOSIS — M6281 Muscle weakness (generalized): Secondary | ICD-10-CM

## 2018-06-15 NOTE — Telephone Encounter (Signed)
Ok to try off protonix and substitute pepcid 20 mg bid for 2  weeks but I strongly doubt her urinary symptoms are due to ppi and advise her to have this eval by pcp or gyn - but p 2 weeks off ppi needs to return to regroup either to see me or NP

## 2018-06-15 NOTE — Telephone Encounter (Signed)
Called and spoke with Patient.  Dr Melvyn Novas recommendations given.  Patient stated understanding, and stated she was writing Dr Melvyn Novas instructions.  Patient is scheduled with Dr Melvyn Novas, 06/29/18, at 1130.  Nothing further at this time.

## 2018-06-15 NOTE — Therapy (Signed)
[facility] [address]  [place], [place], [address] Phone: [phone]   Fax:  [phone]  Physical Therapy Treatment  Patient Details  Name: [name] MRN: [id number] Date of Birth: 09-28-1929 Referring Provider (PT): Karlton Lemon, MD   Encounter Date: 06/15/2018  PT End of Session - 06/15/18 1424    Visit Number  11    Number of Visits  22    Date for PT Re-Evaluation  07/25/18    Authorization Type  [employer]HC Medicare    PT Start Time  1402    PT Stop Time  1445    PT Time Calculation (min)  43 min    Activity Tolerance  Patient tolerated treatment well    Behavior During Therapy  [facility] for tasks assessed/performed       Past Medical History:  Diagnosis Date  . Anxiety and depression   . Back pain    lumbar  . Fecal incontinence    Dr. Michail Sermon, likely due to loose stools in conjunction with pelvic muscle laxity from aging, recommended to stop Miralax  . GI (gastrointestinal bleed)    hx of colon polyps 2004, diverticulosis, internal hemmorhids  . Headache   . Headache(784.0)   . Hydronephrosis    Right 2008 s/p urology eval CT/cysto: observation/monitor  . Hyperlipidemia   . Hypertension   . Knee pain    right chronic  . Migraine    on topamax , per neuro  . Osteoporosis   . Pulmonary nodules    Wert. First seen by CT only 05/04/03, no change on f/u 08/08/06 -Macroscopic changes rml 04/27/07 > resolved april 6,2010 no further w/u   . Seborrheic keratosis    Left inferior posterior flank  . SVT (supraventricular tachycardia) (HCC)    sees Dr.Ross    Past Surgical History:  Procedure Laterality Date  . ABDOMINAL HYSTERECTOMY    . APPENDECTOMY    . BASAL CELL CARCINOMA EXCISION    . BLADDER REPAIR    . CATARACT EXTRACTION  2008   Dr.Digby  . CHOLECYSTECTOMY    . lumbar reconstructive surgery  01/07/2009  . R ovary removed    . ROTATOR CUFF REPAIR  9/-2011  . SHOULDER SURGERY  12-2011   LEFT  . TONSILLECTOMY    . TOTAL KNEE ARTHROPLASTY     right  . trigger finger surgery     L thumb (2014), R ring finger 05-2013    There were no vitals filed for this visit.  Subjective Assessment - 06/15/18 1425    Subjective  Pt. reporting she feels more aware of keeping upright posture.      Pertinent History  R TKR; B RTC repair    Diagnostic tests  Neck x-ray 04/20/18 - 1. No cervical spine fracture or acute malalignment; 2. Reversal of the normal cervical lordosis, usually due to positioning and/or muscle spasm; 3. Moderate multilevel cervical degenerative disc disease, most prominent at C2-3; 4. Mild degenerative foraminal stenosis on the right at C4-5.    Patient Stated Goals  "able to move my head any way I want to w/o extra pain"    Currently in Pain?  No/denies    Pain Score  0-No pain    Multiple Pain Sites  No         [facility] PT Assessment - 06/15/18 0001      Observation/Other Assessments   Focus on Therapeutic Outcomes (FOTO)   50% (50% limitation)  [facility] Adult PT Treatment/Exercise - 06/15/18 1425      Neck Exercises: Machines for Strengthening   UBE (Upper Arm Bike)  L2.0 x 6 min (3" fwd/3"back)      Neck Exercises: Theraband   Rows  Red;15 reps    Rows Limitations  standing with cues for scapular retraction and upright posture     Shoulder External Rotation  15 reps    Shoulder External Rotation Limitations  B ER yellow TB; seated in chair with pool noodle to encourage upright posture     Horizontal ABduction  15 reps;Red    Horizontal ABduction Limitations  seated with pool noodle along spine in back of chair to promote scap retraction & upright head posture      Neck Exercises: Seated   Neck Retraction  10 reps;5 secs    Neck Retraction Limitations  Cues required throughout session for proper motion    Shoulder Rolls  10 reps    Shoulder Rolls Limitations  Reverse shoulder rolls     Other Seated Exercise  Scapular retraction  5" x 10 reps seated in chair with poole noodle to encouraged upright posture       Manual Therapy   Manual Therapy  Soft tissue mobilization;Myofascial release;Passive ROM    Manual therapy comments  supine     Soft tissue mobilization  B UT, cervical paraspinals     Passive ROM  gentle cervical PROM all directions with B UT, LS, SCM stretch x 30 sec each       Neck Exercises: Stretches   Upper Trapezius Stretch  Right;Left;30 seconds;1 rep               PT Short Term Goals - 06/13/18 1411      PT SHORT TERM GOAL #1   Title  Independent with intial HEP    Status  Achieved      PT SHORT TERM GOAL #2   Title  Patient to demonstrate appropriate posture and body mechanics needed for daily activities    Status  Partially Met    Target Date  07/04/18        PT Long Term Goals - 06/13/18 1412      PT LONG TERM GOAL #1   Title  Independent with ongoing/advanced HEP    Status  Partially Met    Target Date  07/25/18      PT LONG TERM GOAL #2   Title  Patient to improve cervical AROM to [facility] without pain provocation     Status  Partially Met    Target Date  07/25/18      PT LONG TERM GOAL #3   Title  B shoulder strength >/= 4+/5 for improved postural stability    Status  Partially Met    Target Date  07/25/18      PT LONG TERM GOAL #4   Title  Patient to report ability to perform ADLs and daily household tasks without increased pain    Status  On-going    Target Date  07/25/18            Plan - 06/15/18 1503    Clinical Impression Statement  [name] reporting she does feel more aware of keeping head upright and no longer feeling, "like a brick wall is pushing my head forward".  Tolerated increased focus today on scapular/postural strengthening activities however still requiring therapist VC/TC at times for proper motion.  Manual therapy focused on improving cervical ROM with gentle stretching  and STM.  Pat ended visit pain free thus modalities deferred.  Will  continue to benefit from further skilled therapy to improve cervical ROM, functional strength and postural awareness.      Rehab Potential  Good    PT Treatment/Interventions  Patient/family education;Neuromuscular re-education;Therapeutic exercise;Therapeutic activities;Functional mobility training;Electrical Stimulation;Moist Heat;Ultrasound;Iontophoresis 40m/ml Dexamethasone;Manual techniques;Passive range of motion;Dry needling;Taping;ADLs/Self Care Home Management    PT Next Visit Plan  Gentle cervical ROM and stretching; Postural strengthening; Manual therapy and modalities as indicated    Consulted and Agree with Plan of Care  Patient       Patient will benefit from skilled therapeutic intervention in order to improve the following deficits and impairments:  Pain, Impaired flexibility, Increased muscle spasms, Decreased range of motion, Decreased strength, Postural dysfunction, Improper body mechanics, Impaired UE functional use, Decreased activity tolerance  Visit Diagnosis: Cervicalgia  Abnormal posture  Muscle weakness (generalized)     Problem List Patient Active Problem List   Diagnosis Date Noted  . Edema of both lower extremities 04/18/2018  . Chronic migraine 11/15/2017  . Memory loss 11/15/2017  . Osteoporosis 07/09/2017  . CKD (chronic kidney disease) stage 2, GFR 60-89 ml/min 01/06/2016  . Abdominal pain 09/10/2015  . Pulmonary nodules/lesions, multiple 09/10/2015  . Small bowel obstruction, partial ([facility] [place] 09/10/2015  . Arthritis 09/10/2015  . Partial small bowel obstruction ([facility] 09/10/2015  . PCP NOTES >>>>>> 04/17/2015  . Hemorrhoids 09/17/2011  . Annual physical exam 03/19/2011  . DJD -back pain-on Celebrex   . Cough 09/25/2010  . DOE (dyspnea on exertion) 08/28/2010  . Constipation 01/21/2010  . DIVERTICULOSIS OF COLON 09/02/2009  . PERSONAL HISTORY OF COLONIC POLYPS 09/02/2009  . FULL INCONTINENCE OF FECES 09/02/2009  . Essential hypertension  08/10/2007  . Headache 08/10/2007  . Pulmonary nodules 04/25/2007  . Anxiety state 02/03/2007  . Depression 02/03/2007  . KNEE PAIN, RIGHT, CHRONIC 02/03/2007  . Hyperlipidemia 04/26/2006  . DIZZINESS 04/26/2006  . SVT (supraventricular tachycardia) ([facility] 04/26/2006    MBess Harvest PTA 06/15/18 3:15 PM   [facility] [address] [place] [address]: [phone]  Fax:  [phone] Name: Lauren STRICKERMRN: [id number] of Birth: 105/14/1931

## 2018-06-15 NOTE — Telephone Encounter (Signed)
Patient is returning phone call. Patient phone number is [phone]. 

## 2018-06-15 NOTE — Telephone Encounter (Signed)
ATC Patient.  Left message for her to call back when available.

## 2018-06-20 ENCOUNTER — Ambulatory Visit: Payer: Medicare Other | Attending: Family Medicine

## 2018-06-20 DIAGNOSIS — R293 Abnormal posture: Secondary | ICD-10-CM

## 2018-06-20 DIAGNOSIS — M6281 Muscle weakness (generalized): Secondary | ICD-10-CM | POA: Insufficient documentation

## 2018-06-20 DIAGNOSIS — M542 Cervicalgia: Secondary | ICD-10-CM | POA: Diagnosis present

## 2018-06-20 NOTE — Therapy (Signed)
[facility] [address]  [place], [place], [address] Phone: [phone]   Fax:  [phone]  Physical Therapy Treatment  All interventions performed on day of session.  Patient Details  Name: [name] MRN: [id number] Date of Birth: 1929/06/06 Referring Provider (PT): Karlton Lemon, MD   Encounter Date: 06/20/2018  PT End of Session - 06/20/18 1547    Visit Number  12    Number of Visits  22    Date for PT Re-Evaluation  07/25/18    Authorization Type  UHC Medicare    PT Start Time  8889    PT Stop Time  1533    PT Time Calculation (min)  48 min    Activity Tolerance  Patient tolerated treatment well    Behavior During Therapy  [facility] for tasks assessed/performed       Past Medical History:  Diagnosis Date  . Anxiety and depression   . Back pain    lumbar  . Fecal incontinence    Dr. Michail Sermon, likely due to loose stools in conjunction with pelvic muscle laxity from aging, recommended to stop Miralax  . GI (gastrointestinal bleed)    hx of colon polyps 2004, diverticulosis, internal hemmorhids  . Headache   . Headache(784.0)   . Hydronephrosis    Right 2008 s/p urology eval CT/cysto: observation/monitor  . Hyperlipidemia   . Hypertension   . Knee pain    right chronic  . Migraine    on topamax , per neuro  . Osteoporosis   . Pulmonary nodules    Wert. First seen by CT only 05/04/03, no change on f/u 08/08/06 -Macroscopic changes rml 04/27/07 > resolved april 6,2010 no further w/u   . Seborrheic keratosis    Left inferior posterior flank  . SVT (supraventricular tachycardia) (HCC)    sees Dr.Ross    Past Surgical History:  Procedure Laterality Date  . ABDOMINAL HYSTERECTOMY    . APPENDECTOMY    . BASAL CELL CARCINOMA EXCISION    . BLADDER REPAIR    . CATARACT EXTRACTION  2008   Dr.Digby  . CHOLECYSTECTOMY    . lumbar reconstructive surgery  01/07/2009  . R ovary removed    . ROTATOR CUFF  REPAIR  9/-2011  . SHOULDER SURGERY  12-2011   LEFT  . TONSILLECTOMY    . TOTAL KNEE ARTHROPLASTY     right  . trigger finger surgery     L thumb (2014), R ring finger 05-2013    There were no vitals filed for this visit.  Subjective Assessment - 06/20/18 1457    Subjective  Pt. reporting brother passed away on Jun 22, 2018 which has given her more stress and she feels has made her keep her head down more often.       Pertinent History  R TKR; B RTC repair    Diagnostic tests  Neck x-ray 04/20/18 - 1. No cervical spine fracture or acute malalignment; 2. Reversal of the normal cervical lordosis, usually due to positioning and/or muscle spasm; 3. Moderate multilevel cervical degenerative disc disease, most prominent at C2-3; 4. Mild degenerative foraminal stenosis on the right at C4-5.    Patient Stated Goals  "able to move my head any way I want to w/o extra pain"    Currently in Pain?  Yes    Pain Score  4     Pain Location  Neck    Pain Orientation  Mid;Lower  Pain Descriptors / Indicators  Sore    Pain Type  Acute pain    Multiple Pain Sites  No                       [facility] Adult PT Treatment/Exercise - 06/20/18 1524      Neck Exercises: Machines for Strengthening   UBE (Upper Arm Bike)  L2.5 x 6 min (3" fwd/3"back)      Neck Exercises: Theraband   Rows  Red;15 reps    Rows Limitations  standing with cues for scapular retraction and upright posture    therapist anchored in chair with pool noodle    Horizontal ABduction  15 reps;Red    Horizontal ABduction Limitations  seated with pool noodle along spine in back of chair to promote scap retraction & upright head posture    Other Theraband Exercises  Alt scap retraction + UE diagonals with yellow TB x 12 each  - head following upper hand, sitting with pool noodle behind back to promote scap retraction      Neck Exercises: Seated   Neck Retraction  15 reps;5 secs    Neck Retraction Limitations  therapist holding towel  behind head with pt. instructed to push into towel and improved motion with this with greater pt. understanding     Shoulder Rolls  15 reps    Shoulder Rolls Limitations  Reverse shoulder rolls       Manual Therapy   Manual Therapy  Soft tissue mobilization;Passive ROM    Manual therapy comments  supine     Soft tissue mobilization  B UT, SCM, cervical paraspinals     Passive ROM  gentle cervical PROM all directions with B UT, LS, SCM stretch x 30 sec each                PT Short Term Goals - 06/13/18 1411      PT SHORT TERM GOAL #1   Title  Independent with intial HEP    Status  Achieved      PT SHORT TERM GOAL #2   Title  Patient to demonstrate appropriate posture and body mechanics needed for daily activities    Status  Partially Met    Target Date  07/04/18        PT Long Term Goals - 06/13/18 1412      PT LONG TERM GOAL #1   Title  Independent with ongoing/advanced HEP    Status  Partially Met    Target Date  07/25/18      PT LONG TERM GOAL #2   Title  Patient to improve cervical AROM to [facility] without pain provocation     Status  Partially Met    Target Date  07/25/18      PT LONG TERM GOAL #3   Title  B shoulder strength >/= 4+/5 for improved postural stability    Status  Partially Met    Target Date  07/25/18      PT LONG TERM GOAL #4   Title  Patient to report ability to perform ADLs and daily household tasks without increased pain    Status  On-going    Target Date  07/25/18            Plan - 06/20/18 1548    Clinical Impression Statement  Pat reporting she has been less conscious of upright head posture due to being, "stressed" recently due to death of her brother ~ 1  week ago.  Seen initially with resting downward head positioning which improved to more upright posture with cueing throughout session and with postural strengthening therex.  Manual therapy with stretching and massage of cervical musculature focusing on B SCM with improved posture  noted following this.  Able to perform mild progression of seated band resisted postural strengthening today without increased pain.  Will continue to progress toward goals.      Rehab Potential  Good    PT Treatment/Interventions  Patient/family education;Neuromuscular re-education;Therapeutic exercise;Therapeutic activities;Functional mobility training;Electrical Stimulation;Moist Heat;Ultrasound;Iontophoresis 53m/ml Dexamethasone;Manual techniques;Passive range of motion;Dry needling;Taping;ADLs/Self Care Home Management    PT Next Visit Plan  Gentle cervical ROM and stretching; Postural strengthening; Manual therapy and modalities as indicated    Consulted and Agree with Plan of Care  Patient       Patient will benefit from skilled therapeutic intervention in order to improve the following deficits and impairments:  Pain, Impaired flexibility, Increased muscle spasms, Decreased range of motion, Decreased strength, Postural dysfunction, Improper body mechanics, Impaired UE functional use, Decreased activity tolerance  Visit Diagnosis: Cervicalgia  Abnormal posture  Muscle weakness (generalized)     Problem List Patient Active Problem List   Diagnosis Date Noted  . Edema of both lower extremities 04/18/2018  . Chronic migraine 11/15/2017  . Memory loss 11/15/2017  . Osteoporosis 07/09/2017  . CKD (chronic kidney disease) stage 2, GFR 60-89 ml/min 01/06/2016  . Abdominal pain 09/10/2015  . Pulmonary nodules/lesions, multiple 09/10/2015  . Small bowel obstruction, partial ([facility] 09/10/2015  . Arthritis 09/10/2015  . Partial small bowel obstruction ([place] 09/10/2015  . PCP NOTES >>>>>> 04/17/2015  . Hemorrhoids 09/17/2011  . Annual physical exam 03/19/2011  . DJD -back pain-on Celebrex   . Cough 09/25/2010  . DOE (dyspnea on exertion) 08/28/2010  . Constipation 01/21/2010  . DIVERTICULOSIS OF COLON 09/02/2009  . PERSONAL HISTORY OF COLONIC POLYPS 09/02/2009  . FULL INCONTINENCE  OF FECES 09/02/2009  . Essential hypertension 08/10/2007  . Headache 08/10/2007  . Pulmonary nodules 04/25/2007  . Anxiety state 02/03/2007  . Depression 02/03/2007  . KNEE PAIN, RIGHT, CHRONIC 02/03/2007  . Hyperlipidemia 04/26/2006  . DIZZINESS 04/26/2006  . SVT (supraventricular tachycardia) ([facility] 04/26/2006    MBess Harvest PTA 06/21/18 10:00 AM   [facility] [address] [place] [address]Phone: [phone]  Fax:  [phone] Name: PNEKISHA MCDIARMIDMRN: [id number] of Birth: [birth year]

## 2018-06-22 ENCOUNTER — Encounter: Payer: Medicare Other | Admitting: Physical Therapy

## 2018-06-26 ENCOUNTER — Ambulatory Visit: Payer: Medicare Other | Admitting: Neurology

## 2018-06-26 ENCOUNTER — Encounter: Payer: Self-pay | Admitting: Internal Medicine

## 2018-06-26 ENCOUNTER — Ambulatory Visit: Payer: Medicare Other | Admitting: Internal Medicine

## 2018-06-26 VITALS — BP 130/80 | HR 70 | Ht 64.0 in | Wt 163.0 lb

## 2018-06-26 DIAGNOSIS — R05 Cough: Secondary | ICD-10-CM | POA: Diagnosis not present

## 2018-06-26 DIAGNOSIS — R0609 Other forms of dyspnea: Secondary | ICD-10-CM | POA: Diagnosis not present

## 2018-06-26 DIAGNOSIS — R059 Cough, unspecified: Secondary | ICD-10-CM

## 2018-06-26 DIAGNOSIS — R0600 Dyspnea, unspecified: Secondary | ICD-10-CM

## 2018-06-26 MED ORDER — PANTOPRAZOLE SODIUM 40 MG PO TBEC: 40.0000 mg | DELAYED_RELEASE_TABLET | Freq: Every day | ORAL | 2 refills | Status: DC

## 2018-06-26 NOTE — Patient Instructions (Addendum)
Pantoprazole (protonix) 40 mg   Take  30-60 min before first meal of the day and Pepcid (famotidine)  20 mg one @  bedtime until return to office whether you think you need it or not  - this is the best way to tell whether stomach acid is contributing to your problem as it is the leading suspect at this point.  GERD (REFLUX)  is an extremely common cause of respiratory symptoms just like yours , many times with no obvious heartburn at all.    It can be treated with medication, but also with lifestyle changes including elevation of the head of your bed (ideally with 6 -8inch blocks under the headboard of your bed),  Smoking cessation, avoidance of late meals, excessive alcohol, and avoid fatty foods, chocolate, peppermint, colas, red wine, and acidic juices such as orange juice.  NO MINT OR MENTHOL PRODUCTS SO NO COUGH DROPS  USE SUGARLESS CANDY INSTEAD (Jolley ranchers or Stover's or Life Savers) or even ice chips will also do - the key is to swallow to prevent all throat clearing. NO OIL BASED VITAMINS - use powdered substitutes.  Avoid fish oil when coughing.    Keep walking as much possible    Please remember to go to the lab department   for your tests - we will call you with the results when they are available.      Keep previous appt

## 2018-06-26 NOTE — Progress Notes (Signed)
[name], female    DOB: [birth year],  .   MRN: [id number]   Brief patient profile:  47 yowf never smoker never breathing problems until fell tgiving 2019  and hit head in bathroom hitting on marble flooring and doe since so referred to pulmonary clinic 06/06/2018 by Dr Larose Kells p neg cardiac w/u by [facility]   Note seen pulmonary clinic  09/25/10 with "cough since 2010" with dx UACS rec gerd rx and zyrtec and improved but never completely resolved.     History of Present Illness  06/06/2018  Pulmonary/ 1st office eval/Valree Feild  Chief Complaint  Patient presents with  . Pulmonary Consult    Referred by Dr. Larose Kells.  Pt c/o DOE with any exertion since she fell Thanksgiving 2019.    Dyspnea:  abrupt onset in nov 2019 p fell - barely now able to do HT p using HC parking = MMRC3 = can't walk 100 yards even at a slow pace at a flat grade s stopping due to sob   Cough: has had some pnds x decades no recent eval by allergy or ent  Sleep: in recliner ever before she fell same angle almost flat no problem with cough /wheeze or sob  SABA use: none Pos sense of globus  Prev w/u 2012 better on gerd rx/ zyrtec rec Try zyrtec 10 mg at bedtime instead of tylenol sinus  Pantoprazole (protonix) 40 mg   Take  30-60 min before first meal of the day and Pepcid (famotidine)  20 mg one after supper  until return to office - this is the best way to tell whether stomach acid is contributing to your problem.   GERD  Diet  Please schedule a follow up office visit in 6 weeks, call sooner if needed with all medications /inhalers/ solutions in hand so we can verify exactly what you are taking. This includes all medications from all doctors and over the counters with pfts on return     06/26/2018  f/u ov/Kaius Daino re: uacs/ globus sensation  Chief Complaint  Patient presents with  . Follow-up    Breathing is unchanged. No new co's.   Dyspnea:  Walks with cane room to room then gives out more to orthopedic than pulmonary  limitations  Cough: sense of pnds/ globus no better on zyrtec and never took ppi Sleeping: In recliner chronically     No obvious day to day or daytime variability or assoc excess/ purulent sputum or mucus plugs or hemoptysis or cp or chest tightness, subjective wheeze or overt sinus or hb symptoms.   Seeping as above without nocturnal  or early am exacerbation  of respiratory  c/o's or need for noct saba. Also denies any obvious fluctuation of symptoms with weather or environmental changes or other aggravating or alleviating factors except as outlined above   No unusual exposure hx or h/o childhood pna/ asthma or knowledge of premature birth.  Current Allergies, Complete Past Medical History, Past Surgical History, Family History, and Social History were reviewed in [employer] record.  ROS  The following are not active complaints unless bolded Hoarseness, sore throat, dysphagia, dental problems, itching, sneezing,  nasal congestion or discharge of excess mucus or purulent secretions, ear ache,   fever, chills, sweats, unintended wt loss or wt gain, classically pleuritic or exertional cp,  orthopnea pnd or arm/hand swelling  or leg swelling, presyncope, palpitations, abdominal pain, anorexia, nausea, vomiting, diarrhea  or change in bowel habits or change in bladder  habits, change in stools or change in urine, dysuria, hematuria,  rash, arthralgias, visual complaints, headache, numbness, weakness or ataxia or problems with walking or coordination,  change in mood or  memory.        Current Meds  Medication Sig  . acetaminophen (TYLENOL) 500 MG tablet Take 500 mg by mouth every 6 (six) hours as needed for moderate pain.  Marland Kitchen atorvastatin (LIPITOR) 40 MG tablet Take 1 tablet (40 mg total) by mouth at bedtime.  . butalbital-acetaminophen-caffeine (FIORICET, ESGIC) 50-325-40 MG tablet Take 1 tablet by mouth every 6 (six) hours as needed for headache. Do not refill in less than  one month, cancel previous Rx  . celecoxib (CELEBREX) 200 MG capsule Take 200 mg by mouth daily.  Marland Kitchen CRANBERRY PO Take 1 capsule by mouth daily.  . famotidine (PEPCID) 20 MG tablet One after supper  . gabapentin (NEURONTIN) 100 MG capsule Take 2-3 capsules (200-300 mg total) by mouth 3 (three) times daily.  . multivitamin (THERAGRAN) per tablet Take 1 tablet by mouth daily.    . NON FORMULARY Take 2 capsules by mouth 2 (two) times daily. [employer]	[place]  . Probiotic Product (PROBIOTIC DAILY PO) Take 1 capsule by mouth daily.   . propranolol ER (INDERAL LA) 60 MG 24 hr capsule Take 1 capsule (60 mg total) by mouth daily.                        Objective:      amb pleasant wf nad walks slow with cane   Wt Readings from Last 3 Encounters:  06/26/18 163 lb (73.9 kg)  06/07/18 164 lb (74.4 kg)  06/06/18 164 lb (74.4 kg)     Vital signs reviewed - Note on arrival 02 sats  97% on RA      HEENT: nl dentition, turbinates bilaterally, and oropharynx. Nl external ear canals without cough reflex   NECK :  without JVD/Nodes/TM/ nl carotid upstrokes bilaterally   LUNGS: no acc muscle use,  Nl contour chest which is clear to A and P bilaterally without cough on insp or exp maneuvers   CV:  RRR  no s3 or murmur or increase in P2, and no edema   ABD:  soft and nontender with nl inspiratory excursion in the supine position. No bruits or organomegaly appreciated, bowel sounds nl  MS: very slow  Gait- walks with cane -  ext warm without deformities, calf tenderness, cyanosis or clubbing    SKIN: warm and dry without lesions    NEURO:  alert, approp, nl sensorium with  no motor or cerebellar deficits apparent.        Labs ordered 06/26/2018    Allergy profile          Assessment

## 2018-06-27 ENCOUNTER — Ambulatory Visit: Payer: Medicare Other

## 2018-06-27 ENCOUNTER — Other Ambulatory Visit: Payer: Self-pay | Admitting: Internal Medicine

## 2018-06-27 DIAGNOSIS — R293 Abnormal posture: Secondary | ICD-10-CM

## 2018-06-27 DIAGNOSIS — M542 Cervicalgia: Secondary | ICD-10-CM | POA: Diagnosis not present

## 2018-06-27 DIAGNOSIS — M6281 Muscle weakness (generalized): Secondary | ICD-10-CM

## 2018-06-27 LAB — RESPIRATORY ALLERGY PROFILE REGION II ~~LOC~~
Allergen, Cedar tree, t12: <0.1 kU/L
Allergen, Cottonwood, t14: <0.1 kU/L
Allergen, D pternoyssinus,d7: <0.1 kU/L
Allergen, Mouse Urine Protein, e78: <0.1 kU/L
Allergen, Oak,t7: <0.1 kU/L
Aspergillus fumigatus, m3: <0.1 kU/L
Bermuda Grass: <0.1 kU/L
Box Elder IgE: <0.1 kU/L
CLASS: 0
Cat Dander: <0.1 kU/L
Class: 0
Class: 0
Class: 0
Class: 0
Class: 0
Class: 0
Class: 0
Class: 0
Class: 0
Class: 0
Class: 0
Class: 0
Class: 0
Class: 0
Class: 0
Class: 0
Class: 0
Class: 0
Class: 0
Class: 0
Class: 0
Class: 0
Class: 0
Cockroach: <0.1 kU/L
Dog Dander: <0.1 kU/L
Elm IgE: <0.1 kU/L
IgE (Immunoglobulin E), Serum: 4 kU/L (ref <=114)
Johnson Grass: <0.1 kU/L
Pecan/Hickory Tree IgE: <0.1 kU/L
Rough Pigweed  IgE: <0.1 kU/L
Sheep Sorrel IgE: <0.1 kU/L
Timothy Grass: <0.1 kU/L

## 2018-06-27 LAB — CBC WITH DIFFERENTIAL/PLATELET
Basophils Absolute: 0.1 10*3/uL (ref 0.0–0.1)
Basophils Relative: 1.1 % (ref 0.0–3.0)
EOS ABS: 0.2 10*3/uL (ref 0.0–0.7)
Eosinophils Relative: 3.3 % (ref 0.0–5.0)
HCT: 36.8 % (ref 36.0–46.0)
Hemoglobin: 12.5 g/dL (ref 12.0–15.0)
Lymphocytes Relative: 28.7 % (ref 12.0–46.0)
Lymphs Abs: 1.6 10*3/uL (ref 0.7–4.0)
MCHC: 34.1 g/dL (ref 30.0–36.0)
MCV: 96.4 fl (ref 78.0–100.0)
Monocytes Absolute: 1 10*3/uL (ref 0.1–1.0)
Monocytes Relative: 17.7 % — ABNORMAL HIGH (ref 3.0–12.0)
Neutro Abs: 2.7 10*3/uL (ref 1.4–7.7)
Neutrophils Relative %: 49.2 % (ref 43.0–77.0)
Platelets: 291 10*3/uL (ref 150.0–400.0)
RBC: 3.82 Mil/uL — ABNORMAL LOW (ref 3.87–5.11)
RDW: 14.3 % (ref 11.5–15.5)
WBC: 5.6 10*3/uL (ref 4.0–10.5)

## 2018-06-27 LAB — INTERPRETATION:

## 2018-06-27 NOTE — Therapy (Signed)
[facility] [address]  [place], [place], [address] Phone: [phone]   Fax:  [phone]  Physical Therapy Treatment  Patient Details  Name: [name] MRN: [id number] Date of Birth: 1929-06-20 Referring Provider (PT): Karlton Lemon, MD   Encounter Date: 06/27/2018  PT End of Session - 06/27/18 1413    Visit Number  13    Number of Visits  22    Date for PT Re-Evaluation  07/25/18    Authorization Type  UHC Medicare    PT Start Time  1401    PT Stop Time  1500   ended visit with 15 min moist heat   PT Time Calculation (min)  59 min    Activity Tolerance  Patient tolerated treatment well    Behavior During Therapy  [facility] for tasks assessed/performed       Past Medical History:  Diagnosis Date  . Anxiety and depression   . Back pain    lumbar  . Fecal incontinence    Dr. Michail Sermon, likely due to loose stools in conjunction with pelvic muscle laxity from aging, recommended to stop Miralax  . GI (gastrointestinal bleed)    hx of colon polyps 2004, diverticulosis, internal hemmorhids  . Headache   . Headache(784.0)   . Hydronephrosis    Right 2008 s/p urology eval CT/cysto: observation/monitor  . Hyperlipidemia   . Hypertension   . Knee pain    right chronic  . Migraine    on topamax , per neuro  . Osteoporosis   . Pulmonary nodules    Wert. First seen by CT only 05/04/03, no change on f/u 08/08/06 -Macroscopic changes rml 04/27/07 > resolved april 6,2010 no further w/u   . Seborrheic keratosis    Left inferior posterior flank  . SVT (supraventricular tachycardia) (HCC)    sees Dr.Ross    Past Surgical History:  Procedure Laterality Date  . ABDOMINAL HYSTERECTOMY    . APPENDECTOMY    . BASAL CELL CARCINOMA EXCISION    . BLADDER REPAIR    . CATARACT EXTRACTION  2008   Dr.Digby  . CHOLECYSTECTOMY    . lumbar reconstructive surgery  01/07/2009  . R ovary removed    . ROTATOR CUFF REPAIR   9/-2011  . SHOULDER SURGERY  12-2011   LEFT  . TONSILLECTOMY    . TOTAL KNEE ARTHROPLASTY     right  . trigger finger surgery     L thumb (2014), R ring finger 05-2013    There were no vitals filed for this visit.  Subjective Assessment - 06/27/18 1409    Subjective  Pt. reporting she has been busy with service for her brother who passed away 2018-07-15 and had to drive to [place] for this Returning on Sunday.  Noted onset of L hip pain with prolonged sitting in car on way to [place].      Pertinent History  R TKR; B RTC repair    Diagnostic tests  Neck x-ray 04/20/18 - 1. No cervical spine fracture or acute malalignment; 2. Reversal of the normal cervical lordosis, usually due to positioning and/or muscle spasm; 3. Moderate multilevel cervical degenerative disc disease, most prominent at C2-3; 4. Mild degenerative foraminal stenosis on the right at C4-5.    Patient Stated Goals  "able to move my head any way I want to w/o extra pain"    Currently in Pain?  No/denies    Pain Score  0-No pain  up to 4/10 tightness at lower neck yesterday.     Pain Location  Neck    Pain Orientation  Mid;Lower    Pain Descriptors / Indicators  Tightness    Pain Type  Acute pain    Pain Frequency  Intermittent    Multiple Pain Sites  Yes    Pain Score  0   up to 9/10 L hip pain while driving to [facility]    Pain Location  Hip    Pain Orientation  Left    Pain Descriptors / Indicators  Sharp;Burning    Pain Type  Acute pain    Pain Onset  In the past 7 days    Pain Frequency  Intermittent    Aggravating Factors   prolonged sitting     Pain Relieving Factors  unsure                        [facility] Adult PT Treatment/Exercise - 06/27/18 1504      Neck Exercises: Machines for Strengthening   UBE (Upper Arm Bike)  L2.5 x 6 min (3" fwd/3"back)    Cybex Row  5# x 15 reps with tactile cueing for scapular retraction       Neck Exercises: Theraband   Shoulder External Rotation  20 reps;Red     Shoulder External Rotation Limitations  Seated in chair with pool noodle behind back to promote upright posture     Horizontal ABduction  20 reps;Red    Horizontal ABduction Limitations  seated with pool noodle along spine in back of chair to promote scap retraction & upright head posture      Neck Exercises: Standing   Neck Retraction  15 reps;5 secs    Neck Retraction Limitations  therapist guided + scapular       Moist Heat Therapy   Number Minutes Moist Heat  15 Minutes    Moist Heat Location  Cervical      Manual Therapy   Manual Therapy  Soft tissue mobilization;Passive ROM    Manual therapy comments  supine     Soft tissue mobilization  B UT, SCM, cervical paraspinals    increased tension noted in B SCM   Passive ROM  gentle cervical PROM all directions with B UT, LS, SCM stretch x 30 sec each                PT Short Term Goals - 06/13/18 1411      PT SHORT TERM GOAL #1   Title  Independent with intial HEP    Status  Achieved      PT SHORT TERM GOAL #2   Title  Patient to demonstrate appropriate posture and body mechanics needed for daily activities    Status  Partially Met    Target Date  07/04/18        PT Long Term Goals - 06/13/18 1412      PT LONG TERM GOAL #1   Title  Independent with ongoing/advanced HEP    Status  Partially Met    Target Date  07/25/18      PT LONG TERM GOAL #2   Title  Patient to improve cervical AROM to [facility] without pain provocation     Status  Partially Met    Target Date  07/25/18      PT LONG TERM GOAL #3   Title  B shoulder strength >/= 4+/5 for improved postural stability    Status  Partially Met    Target Date  07/25/18      PT LONG TERM GOAL #4   Title  Patient to report ability to perform ADLs and daily household tasks without increased pain    Status  On-going    Target Date  07/25/18            Plan - 06/27/18 1412    Clinical Impression Statement  Pt. noting she has busy and stressed driving to [place] for  brothers funeral and burial over weekend.  Pt. with initial forward head with downward gaze requiring cueing for upright posture with awareness improving throughout session.  Notes neck has had low-level pain however primary concern today is "sharp" buttocks pain which started following four hour car ride to [place].  Session focusing on postural/scapular strengthening activities with introduction to machine row.  Pt. requiring frequent cueing for proper upright posture and scapular mechanics with therex today.  Ended visit with moist heat applied to cervical spine for hopeful reduction in upper shoulder muscular tone.  Will continue to progress toward goals.      Rehab Potential  Good    PT Treatment/Interventions  Patient/family education;Neuromuscular re-education;Therapeutic exercise;Therapeutic activities;Functional mobility training;Electrical Stimulation;Moist Heat;Ultrasound;Iontophoresis 41m/ml Dexamethasone;Manual techniques;Passive range of motion;Dry needling;Taping;ADLs/Self Care Home Management    PT Next Visit Plan  Gentle cervical ROM and stretching; Postural strengthening; Manual therapy and modalities as indicated    Consulted and Agree with Plan of Care  Patient       Patient will benefit from skilled therapeutic intervention in order to improve the following deficits and impairments:  Pain, Impaired flexibility, Increased muscle spasms, Decreased range of motion, Decreased strength, Postural dysfunction, Improper body mechanics, Impaired UE functional use, Decreased activity tolerance  Visit Diagnosis: Cervicalgia  Abnormal posture  Muscle weakness (generalized)     Problem List Patient Active Problem List   Diagnosis Date Noted  . Edema of both lower extremities 04/18/2018  . Chronic migraine 11/15/2017  . Memory loss 11/15/2017  . Osteoporosis 07/09/2017  . CKD (chronic kidney disease) stage 2, GFR 60-89 ml/min 01/06/2016  . Abdominal pain 09/10/2015  . Pulmonary  nodules/lesions, multiple 09/10/2015  . Small bowel obstruction, partial ([facility] 09/10/2015  . Arthritis 09/10/2015  . Partial small bowel obstruction ([facility] 09/10/2015  . PCP NOTES >>>>>> 04/17/2015  . Hemorrhoids 09/17/2011  . Annual physical exam 03/19/2011  . DJD -back pain-on Celebrex   . Cough 09/25/2010  . DOE (dyspnea on exertion) 08/28/2010  . Constipation 01/21/2010  . DIVERTICULOSIS OF COLON 09/02/2009  . PERSONAL HISTORY OF COLONIC POLYPS 09/02/2009  . FULL INCONTINENCE OF FECES 09/02/2009  . Essential hypertension 08/10/2007  . Headache 08/10/2007  . Pulmonary nodules 04/25/2007  . Anxiety state 02/03/2007  . Depression 02/03/2007  . KNEE PAIN, RIGHT, CHRONIC 02/03/2007  . Hyperlipidemia 04/26/2006  . DIZZINESS 04/26/2006  . SVT (supraventricular tachycardia) ([place] 04/26/2006    MBess Harvest PTA 06/27/18 6:08 PM   [facility] [address] [place] [address]Phone: [phone]  Fax:  [phone] Name: Lauren STAMBAUGHMRN: [id number] of Birth: [birth year]

## 2018-06-28 ENCOUNTER — Ambulatory Visit: Payer: Self-pay | Admitting: Neurology

## 2018-06-28 NOTE — Progress Notes (Signed)
Spoke with pt and notified of results per Dr. Wert. Pt verbalized understanding and denied any questions. 

## 2018-06-29 ENCOUNTER — Ambulatory Visit: Payer: Medicare Other | Admitting: Physical Therapy

## 2018-06-29 ENCOUNTER — Encounter: Payer: Self-pay | Admitting: Physical Therapy

## 2018-06-29 ENCOUNTER — Other Ambulatory Visit: Payer: Self-pay

## 2018-06-29 ENCOUNTER — Ambulatory Visit: Payer: Medicare Other | Admitting: Primary Care

## 2018-06-29 DIAGNOSIS — M6281 Muscle weakness (generalized): Secondary | ICD-10-CM

## 2018-06-29 DIAGNOSIS — M542 Cervicalgia: Secondary | ICD-10-CM | POA: Diagnosis not present

## 2018-06-29 DIAGNOSIS — R293 Abnormal posture: Secondary | ICD-10-CM

## 2018-06-29 NOTE — Therapy (Signed)
[facility] [address]  [place], [place], [address] Phone: [phone]   Fax:  [phone]  Physical Therapy Treatment  Patient Details  Name: [name] MRN: [id number] Date of Birth: [birth year] Referring Provider (PT): Karlton Lemon, MD   Encounter Date: 06/29/2018  PT End of Session - 06/29/18 1406    Visit Number  14    Number of Visits  22    Date for PT Re-Evaluation  07/25/18    Authorization Type  UHC Medicare    PT Start Time  1406   pt arrived late   PT Stop Time  1457    PT Time Calculation (min)  51 min    Activity Tolerance  Patient tolerated treatment well    Behavior During Therapy  [facility] for tasks assessed/performed       Past Medical History:  Diagnosis Date  . Anxiety and depression   . Back pain    lumbar  . Fecal incontinence    Dr. Michail Sermon, likely due to loose stools in conjunction with pelvic muscle laxity from aging, recommended to stop Miralax  . GI (gastrointestinal bleed)    hx of colon polyps 2004, diverticulosis, internal hemmorhids  . Headache   . Headache(784.0)   . Hydronephrosis    Right 2008 s/p urology eval CT/cysto: observation/monitor  . Hyperlipidemia   . Hypertension   . Knee pain    right chronic  . Migraine    on topamax , per neuro  . Osteoporosis   . Pulmonary nodules    Wert. First seen by CT only 05/04/03, no change on f/u 08/08/06 -Macroscopic changes rml 04/27/07 > resolved april 6,2010 no further w/u   . Seborrheic keratosis    Left inferior posterior flank  . SVT (supraventricular tachycardia) (HCC)    sees Dr.Ross    Past Surgical History:  Procedure Laterality Date  . ABDOMINAL HYSTERECTOMY    . APPENDECTOMY    . BASAL CELL CARCINOMA EXCISION    . BLADDER REPAIR    . CATARACT EXTRACTION  2008   Dr.Digby  . CHOLECYSTECTOMY    . lumbar reconstructive surgery  01/07/2009  . R ovary removed    . ROTATOR CUFF REPAIR  9/-2011  . SHOULDER  SURGERY  12-2011   LEFT  . TONSILLECTOMY    . TOTAL KNEE ARTHROPLASTY     right  . trigger finger surgery     L thumb (2014), R ring finger 05-2013    There were no vitals filed for this visit.  Subjective Assessment - 06/29/18 1416    Subjective  Pt reporting she saw Dr. Veverly Fells for her L hip today and received an injection. Feels like her neck has gotten worse with with her greiving her brothers death.    Pertinent History  R TKR; B RTC repair    Diagnostic tests  Neck x-ray 04/20/18 - 1. No cervical spine fracture or acute malalignment; 2. Reversal of the normal cervical lordosis, usually due to positioning and/or muscle spasm; 3. Moderate multilevel cervical degenerative disc disease, most prominent at C2-3; 4. Mild degenerative foraminal stenosis on the right at C4-5.    Patient Stated Goals  "able to move my head any way I want to w/o extra pain"    Currently in Pain?  No/denies    Pain Onset  In the past 7 days  [facility] Adult PT Treatment/Exercise - 06/29/18 1406      Self-Care   Self-Care  Posture    Posture  Utilized mirror feedback during exercises to promote upright head posture and midline head position with AROM into cervical extension.      Exercises   Exercises  Neck      Neck Exercises: Machines for Strengthening   UBE (Upper Arm Bike)  L2.5 x 6 min (3" fwd/3"back)      Neck Exercises: Theraband   Shoulder External Rotation  15 reps   yellow   Shoulder External Rotation Limitations  unsupported sitting - cues for scap retraction + cervical retraction/extension    Horizontal ABduction  15 reps   yellow   Horizontal ABduction Limitations  unsupported sitting - cues for scap retraction + cervical retraction/extension      Neck Exercises: Seated   Neck Retraction  15 reps;5 secs    Neck Retraction Limitations  PT providing verbal and tactile cues for proper motion avoiding cervical flexion    Upper Extremity D2  Flexion;10  reps;Theraband    Theraband Level (UE D2)  Level 1 (Yellow)    UE D2 Limitations  + cervical extension & rotation with eyes following hand    Other Seated Exercise  Cervical extension with manual PT facilitation 15 x 3 sec      Modalities   Modalities  Moist Heat      Moist Heat Therapy   Number Minutes Moist Heat  10 Minutes    Moist Heat Location  Cervical      Manual Therapy   Manual Therapy  Soft tissue mobilization;Myofascial release;Passive ROM    Manual therapy comments  seated    Soft tissue mobilization  B UT, LS, SCM, scalenes & cervical paraspinals    Myofascial Release  pin & stretch to B UT & LS    Passive ROM  MWM into cervical retraction & extension               PT Short Term Goals - 06/13/18 1411      PT SHORT TERM GOAL #1   Title  Independent with intial HEP    Status  Achieved      PT SHORT TERM GOAL #2   Title  Patient to demonstrate appropriate posture and body mechanics needed for daily activities    Status  Partially Met    Target Date  07/04/18        PT Long Term Goals - 06/13/18 1412      PT LONG TERM GOAL #1   Title  Independent with ongoing/advanced HEP    Status  Partially Met    Target Date  07/25/18      PT LONG TERM GOAL #2   Title  Patient to improve cervical AROM to [facility] without pain provocation     Status  Partially Met    Target Date  07/25/18      PT LONG TERM GOAL #3   Title  B shoulder strength >/= 4+/5 for improved postural stability    Status  Partially Met    Target Date  07/25/18      PT LONG TERM GOAL #4   Title  Patient to report ability to perform ADLs and daily household tasks without increased pain    Status  On-going    Target Date  07/25/18            Plan - 06/29/18 1435    Clinical Impression Statement  [name] stating she feels like her neck has gotten worse over the past week while greiving her brother's death and traveling for the funeral - pt does demonstrate decreased ability to maintain  neutral cervical spine alignment with walking and moving around with chin more frequently resting on her chest. Pt reports decreased self-awareness of neutral head positioning stating she feels like her head is straight while still maintained in moderate flexion, therefore majority of treatment session completed in front of mirror to allow for visual feedback to reinforce proper positioning and movment patterns. Pt encouraged to perform HEP in front of mirror at home to allow for ongoing visual feedback.    Rehab Potential  Good    PT Treatment/Interventions  Patient/family education;Neuromuscular re-education;Therapeutic exercise;Therapeutic activities;Functional mobility training;Electrical Stimulation;Moist Heat;Ultrasound;Iontophoresis 75m/ml Dexamethasone;Manual techniques;Passive range of motion;Dry needling;Taping;ADLs/Self Care Home Management    PT Next Visit Plan  Gentle cervical ROM and stretching; Postural strengthening; Manual therapy and modalities as indicated    Consulted and Agree with Plan of Care  Patient       Patient will benefit from skilled therapeutic intervention in order to improve the following deficits and impairments:  Pain, Impaired flexibility, Increased muscle spasms, Decreased range of motion, Decreased strength, Postural dysfunction, Improper body mechanics, Impaired UE functional use, Decreased activity tolerance  Visit Diagnosis: Cervicalgia  Abnormal posture  Muscle weakness (generalized)     Problem List Patient Active Problem List   Diagnosis Date Noted  . Edema of both lower extremities 04/18/2018  . Chronic migraine 11/15/2017  . Memory loss 11/15/2017  . Osteoporosis 07/09/2017  . CKD (chronic kidney disease) stage 2, GFR 60-89 ml/min 01/06/2016  . Abdominal pain 09/10/2015  . Pulmonary nodules/lesions, multiple 09/10/2015  . Small bowel obstruction, partial ([facility] 09/10/2015  . Arthritis 09/10/2015  . Partial small bowel obstruction ([facility]  09/10/2015  . PCP NOTES >>>>>> 04/17/2015  . Hemorrhoids 09/17/2011  . Annual physical exam 03/19/2011  . DJD -back pain-on Celebrex   . Cough 09/25/2010  . DOE (dyspnea on exertion) 08/28/2010  . Constipation 01/21/2010  . DIVERTICULOSIS OF COLON 09/02/2009  . PERSONAL HISTORY OF COLONIC POLYPS 09/02/2009  . FULL INCONTINENCE OF FECES 09/02/2009  . Essential hypertension 08/10/2007  . Headache 08/10/2007  . Pulmonary nodules 04/25/2007  . Anxiety state 02/03/2007  . Depression 02/03/2007  . KNEE PAIN, RIGHT, CHRONIC 02/03/2007  . Hyperlipidemia 04/26/2006  . DIZZINESS 04/26/2006  . SVT (supraventricular tachycardia) ([facility] 04/26/2006    JPercival Spanish PT, MPT 06/29/2018, 6:37 PM  [facility] [place] [address]Phone: [phone]  Fax:  [phone] Name: Lauren KETTERINGMRN: [id number] of Birth: [birth year]

## 2018-07-03 ENCOUNTER — Encounter: Payer: Self-pay | Admitting: Internal Medicine

## 2018-07-04 ENCOUNTER — Other Ambulatory Visit: Payer: Self-pay

## 2018-07-04 ENCOUNTER — Encounter: Payer: Self-pay | Admitting: Internal Medicine

## 2018-07-04 ENCOUNTER — Encounter: Payer: Self-pay | Admitting: Physical Therapy

## 2018-07-04 ENCOUNTER — Ambulatory Visit: Payer: Medicare Other | Admitting: Physical Therapy

## 2018-07-04 DIAGNOSIS — M542 Cervicalgia: Secondary | ICD-10-CM | POA: Diagnosis not present

## 2018-07-04 DIAGNOSIS — R293 Abnormal posture: Secondary | ICD-10-CM

## 2018-07-04 DIAGNOSIS — M6281 Muscle weakness (generalized): Secondary | ICD-10-CM

## 2018-07-04 NOTE — Therapy (Addendum)
[facility] [address]  [place], [place], [address] Phone: [phone]   Fax:  [phone]  Physical Therapy Treatment / Discharge Summary  Patient Details  Name: [name] MRN: [id number] Date of Birth: Jul 22, 1929 Referring Provider (PT): Karlton Lemon, MD   Encounter Date: 07/04/2018  PT End of Session - 07/04/18 1408    Visit Number  15    Number of Visits  22    Date for PT Re-Evaluation  07/25/18    Authorization Type  [employer]HC Medicare    PT Start Time  1408   pt arrived late   PT Stop Time  1502    PT Time Calculation (min)  54 min    Activity Tolerance  Patient tolerated treatment well    Behavior During Therapy  [facility] for tasks assessed/performed       Past Medical History:  Diagnosis Date  . Anxiety and depression   . Back pain    lumbar  . Fecal incontinence    Dr. Michail Sermon, likely due to loose stools in conjunction with pelvic muscle laxity from aging, recommended to stop Miralax  . GI (gastrointestinal bleed)    hx of colon polyps 2004, diverticulosis, internal hemmorhids  . Headache   . Headache(784.0)   . Hydronephrosis    Right 2008 s/p urology eval CT/cysto: observation/monitor  . Hyperlipidemia   . Hypertension   . Knee pain    right chronic  . Migraine    on topamax , per neuro  . Osteoporosis   . Pulmonary nodules    Wert. First seen by CT only 05/04/03, no change on f/u 08/08/06 -Macroscopic changes rml 04/27/07 > resolved april 6,2010 no further w/u   . Seborrheic keratosis    Left inferior posterior flank  . SVT (supraventricular tachycardia) (HCC)    sees Dr.Ross    Past Surgical History:  Procedure Laterality Date  . ABDOMINAL HYSTERECTOMY    . APPENDECTOMY    . BASAL CELL CARCINOMA EXCISION    . BLADDER REPAIR    . CATARACT EXTRACTION  2008   Dr.Digby  . CHOLECYSTECTOMY    . lumbar reconstructive surgery  01/07/2009  . R ovary removed    . ROTATOR CUFF REPAIR   9/-2011  . SHOULDER SURGERY  12-2011   LEFT  . TONSILLECTOMY    . TOTAL KNEE ARTHROPLASTY     right  . trigger finger surgery     L thumb (2014), R ring finger 05-2013    There were no vitals filed for this visit.  Subjective Assessment - 07/04/18 1413    Subjective  Pt with no c/o or concerns today.    Pertinent History  R TKR; B RTC repair    Diagnostic tests  Neck x-ray 04/20/18 - 1. No cervical spine fracture or acute malalignment; 2. Reversal of the normal cervical lordosis, usually due to positioning and/or muscle spasm; 3. Moderate multilevel cervical degenerative disc disease, most prominent at C2-3; 4. Mild degenerative foraminal stenosis on the right at C4-5.    Patient Stated Goals  "able to move my head any way I want to w/o extra pain"    Currently in Pain?  No/denies    Pain Onset  In the past 7 days                       [facility] Adult PT Treatment/Exercise - 07/04/18 1408      Exercises  Exercises  Neck      Neck Exercises: Machines for Strengthening   UBE (Upper Arm Bike)  L2.5 x 6 min (3" fwd/3"back)      Neck Exercises: Theraband   Shoulder Extension  15 reps   yellow TB   Shoulder Extension Limitations  standing - cues for scap retraction & depression with "nose in the air"    Horizontal ABduction  15 reps   yellow   Horizontal ABduction Limitations  seated with pool noodle along spine in back of chair to promote scap retraction & upright head posture + mirror for visual feedback      Neck Exercises: Standing   Wall Push Ups  10 reps    Wall Push Ups Limitations  cues to maintain upright head posture      Neck Exercises: Seated   Neck Retraction  15 reps;5 secs    Neck Retraction Limitations  PT holding yellow TB for resistance + mirror for visual feedback    Upper Extremity D2  Flexion;10 reps;Theraband    Theraband Level (UE D2)  Level 1 (Yellow)    UE D2 Limitations  + cervical extension & rotation with eyes following hand    Other  Seated Exercise  Cervical extension into yellow TB (PT holding band) 10 x 5 sec - mirror for visual feedback      Neck Exercises: Prone   Axial Exension  10 reps    Axial Extension Limitations  leaning over green Pball on mat table    Neck Retraction  10 reps;3 secs    Neck Retraction Limitations  leaning over green Pball on mat table      Modalities   Modalities  Moist Heat      Moist Heat Therapy   Number Minutes Moist Heat  10 Minutes    Moist Heat Location  Cervical               PT Short Term Goals - 06/13/18 1411      PT SHORT TERM GOAL #1   Title  Independent with intial HEP    Status  Achieved      PT SHORT TERM GOAL #2   Title  Patient to demonstrate appropriate posture and body mechanics needed for daily activities    Status  Partially Met    Target Date  07/04/18        PT Long Term Goals - 06/13/18 1412      PT LONG TERM GOAL #1   Title  Independent with ongoing/advanced HEP    Status  Partially Met    Target Date  07/25/18      PT LONG TERM GOAL #2   Title  Patient to improve cervical AROM to [facility] without pain provocation     Status  Partially Met    Target Date  07/25/18      PT LONG TERM GOAL #3   Title  B shoulder strength >/= 4+/5 for improved postural stability    Status  Partially Met    Target Date  07/25/18      PT LONG TERM GOAL #4   Title  Patient to report ability to perform ADLs and daily household tasks without increased pain    Status  On-going    Target Date  07/25/18            Plan - 07/04/18 1417    Clinical Impression Statement  [name] continue to require frequent cues and redirection for awareness of cervical  posture during exercise even when using mirror for visual feedback but some improvement when cues to follow target such as hands with UE diagonals. Introduced prone cervical and throracic extension while leaning over green Pball but pt with very limited control into gravity.    Rehab Potential  Good    PT  Treatment/Interventions  Patient/family education;Neuromuscular re-education;Therapeutic exercise;Therapeutic activities;Functional mobility training;Electrical Stimulation;Moist Heat;Ultrasound;Iontophoresis 39m/ml Dexamethasone;Manual techniques;Passive range of motion;Dry needling;Taping;ADLs/Self Care Home Management    PT Next Visit Plan  Gentle cervical ROM and stretching; Postural strengthening; Manual therapy and modalities as indicated    Consulted and Agree with Plan of Care  Patient       Patient will benefit from skilled therapeutic intervention in order to improve the following deficits and impairments:  Pain, Impaired flexibility, Increased muscle spasms, Decreased range of motion, Decreased strength, Postural dysfunction, Improper body mechanics, Impaired UE functional use, Decreased activity tolerance  Visit Diagnosis: Cervicalgia  Abnormal posture  Muscle weakness (generalized)     Problem List Patient Active Problem List   Diagnosis Date Noted  . Edema of both lower extremities 04/18/2018  . Chronic migraine 11/15/2017  . Memory loss 11/15/2017  . Osteoporosis 07/09/2017  . CKD (chronic kidney disease) stage 2, GFR 60-89 ml/min 01/06/2016  . Abdominal pain 09/10/2015  . Pulmonary nodules/lesions, multiple 09/10/2015  . Small bowel obstruction, partial ([facility] 09/10/2015  . Arthritis 09/10/2015  . Partial small bowel obstruction ([facility] 09/10/2015  . PCP NOTES >>>>>> 04/17/2015  . Hemorrhoids 09/17/2011  . Annual physical exam 03/19/2011  . DJD -back pain-on Celebrex   . Cough 09/25/2010  . DOE (dyspnea on exertion) 08/28/2010  . Constipation 01/21/2010  . DIVERTICULOSIS OF COLON 09/02/2009  . PERSONAL HISTORY OF COLONIC POLYPS 09/02/2009  . FULL INCONTINENCE OF FECES 09/02/2009  . Essential hypertension 08/10/2007  . Headache 08/10/2007  . Pulmonary nodules 04/25/2007  . Anxiety state 02/03/2007  . Depression 02/03/2007  . KNEE PAIN, RIGHT, CHRONIC  02/03/2007  . Hyperlipidemia 04/26/2006  . DIZZINESS 04/26/2006  . SVT (supraventricular tachycardia) ([facility] 04/26/2006    JPercival Spanish PT, MPT 07/04/2018, 3:08 PM  C[facility] LP[address] [place] [address]Phone: [phone]  Fax:  [phone] Name: Lauren MAGUIREMRN: [id number] of Birth: [birth year] PHYSICAL THERAPY DISCHARGE SUMMARY  Visits from Start of Care: 15  Current functional level related to goals / functional outcomes:   Refer to above clinical impression for status as of last visit on 07/04/2018. Patient was placed on hold for Outpatient Rehabilitation Services due to concerns for community transmission of COVID-19. When contacted today about resuming PT services, patient reports [place] therefore will proceed with discharge from PT for current episode.   Remaining deficits:   As above.   Education / Equipment:   HEP  Plan: Patient agrees to discharge.  Patient goals were partially met. Patient is being discharged due to not returning since the last visit.  ?????     JPercival Spanish PT, MPT 09/19/18, 10:50 AM  [facility] [place] [address]Phone: [phone]  Fax:  [phone]

## 2018-07-04 NOTE — Assessment & Plan Note (Addendum)
Onset 2010  - Improved 2012  on Zyrtec typical of upper airway cough syndrome   - worse off zyrtec / gerd rx 06/06/2018 so rec rechallenge with zyrtec/ ppi/h1hs and f/u in 6 weeks  - Allergy profile 06/23/18  >  Eos 0.2 /  IgE  4 RAST neg    Still strongly favor Upper airway cough syndrome (previously labeled PNDS),  is so named because it's frequently impossible to sort out how much is  CR/sinusitis with freq throat clearing (which can be related to primary GERD)   vs  causing  secondary (" extra esophageal")  GERD from wide swings in gastric pressure that occur with throat clearing, often  promoting self use of mint and menthol lozenges that reduce the lower esophageal sphincter tone and exacerbate the problem further in a cyclical fashion.   These are the same pts (now being labeled as having "irritable larynx syndrome" by some cough centers/ globus sensations by others) who not infrequently have a history of having failed to tolerate ace inhibitors,  dry powder inhalers or biphosphonates or report having atypical/extraesophageal reflux symptoms that don't respond to standard doses of PPI  and are easily confused as having aecopd or asthma flares by even experienced allergists/ pulmonologists (myself included).    rec max rx for gerd/ consider increasing  gabapentin next with no other leads to go on for now but return as planned with all meds in hand using a trust but verify approach to confirm accurate Medication  Reconciliation The principal here is that until we are certain that the  patients are doing what we've asked, it makes no sense to ask them to do more.

## 2018-07-04 NOTE — Assessment & Plan Note (Signed)
Onset 02/2018 p fell in bathroom - Echo 05/02/2018 Left ventricle: The cavity size was normal. Wall thickness was   normal. Systolic function was normal. The estimated ejection   fraction was in the range of 55% to 60%. Wall motion was normal;   there were no regional wall motion abnormalities. Doppler   parameters are consistent with abnormal left ventricular   relaxation (grade 1 diastolic dysfunction). - Mitral valve: Mildly calcified annulus. There was mild   regurgitation./ LA size nl  - 06/06/2018   Walked RA x one lap =  approx 250 ft - stopped due to  Sob at faster than avg pace, no desats   Clearly more limited by orthopedic issues than doe at this point - nothing further planned for w/u

## 2018-07-07 ENCOUNTER — Other Ambulatory Visit: Payer: Self-pay | Admitting: Cardiology

## 2018-07-07 ENCOUNTER — Ambulatory Visit: Payer: Medicare Other | Admitting: Physical Therapy

## 2018-07-07 NOTE — Telephone Encounter (Signed)
Refill request received from [facility] for Micardis, not found on patients medication list. Phoned patient to clarify if taking this medication. Pt states she is not taking micardis. Refill request declined.

## 2018-07-13 ENCOUNTER — Encounter: Payer: Medicare Other | Admitting: Physical Therapy

## 2018-07-14 ENCOUNTER — Ambulatory Visit: Payer: Medicare Other | Admitting: Internal Medicine

## 2018-07-18 ENCOUNTER — Ambulatory Visit: Payer: Medicare Other | Admitting: Internal Medicine

## 2018-07-24 DIAGNOSIS — M67372 Transient synovitis, left ankle and foot: Secondary | ICD-10-CM | POA: Diagnosis not present

## 2018-07-25 ENCOUNTER — Telehealth: Payer: Self-pay

## 2018-07-25 NOTE — Telephone Encounter (Signed)
Copied from [place] [id number]. Topic: Referral - Request for Referral >> Jul 24, 2018  3:43 PM  J wrote: Has patient seen PCP for this complaint?yes *If NO, is insurance requiring patient see PCP for this issue before PCP can refer them? Insurance required  Referral for which specialty: podiatry  Preferred provider/office: Dr Jenne [facility] foot and ankle  Reason for referral: existing patient  Cb is [phone] Please call when referral entered Ask for [facility]

## 2018-07-27 DIAGNOSIS — M67372 Transient synovitis, left ankle and foot: Secondary | ICD-10-CM | POA: Diagnosis not present

## 2018-08-03 ENCOUNTER — Telehealth: Payer: Self-pay | Admitting: Physical Therapy

## 2018-08-03 NOTE — Telephone Encounter (Addendum)
[name] was contacted today regarding temporary reduction of Outpatient Rehabilitation Services due to concerns for community transmission of COVID-19.  Patient identity was verified.  Assessed if patient needed to be seen in person by clinician (recent fall or acute injury that requires hands on assessment and advice, change in diet order, post-surgical, special cases, etc.).    Patient did not have an acute/special need that requires in person visit. Proceeded with phone call.  Therapist advised the patient to continue to perform her HEP and assured she had no unanswered questions or concerns at this time.   The patient expressed interest in potentially attempting a telehealth video visit however she is uncertain wether her laptop and internet access will allow for such a visit. She will check with her son or daughter-in-law to determine if a telehealth visit will be possible. If telehealth visits are not an option, Outpatient Rehabilitation Services will follow up with this client when we are able to safely resume care.     Patient is aware we can be reached by telephone during limited business hours in the meantime, , currently 8-3 Monday - Thursday, 8-12 Friday.   [name], PT, MPT  [facility] [address]  [place], [place], [address] Phone: [phone]   Fax:  [phone]

## 2018-08-04 DIAGNOSIS — M67372 Transient synovitis, left ankle and foot: Secondary | ICD-10-CM | POA: Diagnosis not present

## 2018-08-07 ENCOUNTER — Encounter (HOSPITAL_COMMUNITY): Payer: Self-pay | Admitting: Emergency Medicine

## 2018-08-07 ENCOUNTER — Other Ambulatory Visit: Payer: Self-pay

## 2018-08-07 ENCOUNTER — Emergency Department (HOSPITAL_COMMUNITY)
Admission: EM | Admit: 2018-08-07 | Discharge: 2018-08-07 | Disposition: A | Discharge status: Home / Self Care | Payer: Medicare HMO | Attending: Emergency Medicine | Admitting: Emergency Medicine

## 2018-08-07 ENCOUNTER — Emergency Department (HOSPITAL_COMMUNITY): Payer: Medicare HMO

## 2018-08-07 ENCOUNTER — Ambulatory Visit: Payer: Self-pay

## 2018-08-07 DIAGNOSIS — Z79899 Other long term (current) drug therapy: Secondary | ICD-10-CM | POA: Diagnosis not present

## 2018-08-07 DIAGNOSIS — Z96651 Presence of right artificial knee joint: Secondary | ICD-10-CM | POA: Insufficient documentation

## 2018-08-07 DIAGNOSIS — N261 Atrophy of kidney (terminal): Secondary | ICD-10-CM | POA: Diagnosis not present

## 2018-08-07 DIAGNOSIS — Z85828 Personal history of other malignant neoplasm of skin: Secondary | ICD-10-CM | POA: Diagnosis not present

## 2018-08-07 DIAGNOSIS — K921 Melena: Secondary | ICD-10-CM | POA: Diagnosis present

## 2018-08-07 DIAGNOSIS — R197 Diarrhea, unspecified: Secondary | ICD-10-CM

## 2018-08-07 DIAGNOSIS — E876 Hypokalemia: Secondary | ICD-10-CM | POA: Diagnosis not present

## 2018-08-07 DIAGNOSIS — N183 Chronic kidney disease, stage 3 (moderate): Secondary | ICD-10-CM | POA: Insufficient documentation

## 2018-08-07 DIAGNOSIS — I129 Hypertensive chronic kidney disease with stage 1 through stage 4 chronic kidney disease, or unspecified chronic kidney disease: Secondary | ICD-10-CM | POA: Insufficient documentation

## 2018-08-07 LAB — TYPE AND SCREEN
ABO/RH(D): O POS
Antibody Screen: NEGATIVE

## 2018-08-07 LAB — COMPREHENSIVE METABOLIC PANEL
ALT: 12 U/L (ref 0–44)
AST: 16 U/L (ref 15–41)
Albumin: 4.2 g/dL (ref 3.5–5.0)
Alkaline Phosphatase: 80 U/L (ref 38–126)
Anion gap: 11 (ref 5–15)
BUN: 22 mg/dL (ref 8–23)
CO2: 22 mmol/L (ref 22–32)
Calcium: 9.9 mg/dL (ref 8.9–10.3)
Chloride: 106 mmol/L (ref 98–111)
Creatinine, Ser: 0.95 mg/dL (ref 0.44–1.00)
GFR calc Af Amer: >60 mL/min (ref >60)
GFR calc non Af Amer: 54 mL/min — ABNORMAL LOW (ref >60)
Glucose, Bld: 106 mg/dL — ABNORMAL HIGH (ref 70–99)
Potassium: 3.3 mmol/L — ABNORMAL LOW (ref 3.5–5.1)
Sodium: 139 mmol/L (ref 135–145)
Total Bilirubin: 1 mg/dL (ref 0.3–1.2)
Total Protein: 7.1 g/dL (ref 6.5–8.1)

## 2018-08-07 LAB — CBC WITH DIFFERENTIAL/PLATELET
Abs Immature Granulocytes: 0.04 10*3/uL (ref 0.00–0.07)
Basophils Absolute: 0.1 10*3/uL (ref 0.0–0.1)
Basophils Relative: 1 %
Eosinophils Absolute: 0.3 10*3/uL (ref 0.0–0.5)
Eosinophils Relative: 3 %
HCT: 40 % (ref 36.0–46.0)
Hemoglobin: 13 g/dL (ref 12.0–15.0)
Immature Granulocytes: 0 %
Lymphocytes Relative: 17 %
Lymphs Abs: 1.6 10*3/uL (ref 0.7–4.0)
MCH: 32.7 pg (ref 26.0–34.0)
MCHC: 32.5 g/dL (ref 30.0–36.0)
MCV: 100.5 fL — ABNORMAL HIGH (ref 80.0–100.0)
Monocytes Absolute: 1.1 10*3/uL — ABNORMAL HIGH (ref 0.1–1.0)
Monocytes Relative: 12 %
Neutro Abs: 6.1 10*3/uL (ref 1.7–7.7)
Neutrophils Relative %: 67 %
Platelets: 295 10*3/uL (ref 150–400)
RBC: 3.98 MIL/uL (ref 3.87–5.11)
RDW: 13.6 % (ref 11.5–15.5)
WBC: 9.1 10*3/uL (ref 4.0–10.5)
nRBC: 0 % (ref 0.0–0.2)

## 2018-08-07 LAB — POC OCCULT BLOOD, ED: Fecal Occult Bld: NEGATIVE

## 2018-08-07 MED ORDER — SODIUM CHLORIDE (PF) 0.9 % IJ SOLN
INTRAMUSCULAR | Status: AC
  Filled 2018-08-07: qty 50

## 2018-08-07 MED ORDER — IOHEXOL 300 MG/ML  SOLN
100.0000 mL | Freq: Once | INTRAMUSCULAR | Status: AC | PRN
  Administered 2018-08-07: 100 mL via INTRAVENOUS

## 2018-08-07 MED ORDER — POTASSIUM CHLORIDE CRYS ER 20 MEQ PO TBCR
40.0000 meq | EXTENDED_RELEASE_TABLET | Freq: Once | ORAL | Status: AC
  Administered 2018-08-07: 40 meq via ORAL
  Filled 2018-08-07: qty 2

## 2018-08-07 NOTE — ED Notes (Signed)
Pt ambulated from bed to restroom under NAD.  Pt provided sample and was walked back to room.  RN notified.

## 2018-08-07 NOTE — Telephone Encounter (Signed)
Pt. called to report onset of watery stool with black tarry flecks of stool since yesterday morning; described as "grainy in appearance".  Reported approx. 20 + stools over past 2 days.  Reported last normal stool was on Saturday evening; "brown formed stool."  Denied any abdominal pain or cramping.  Stated she vomited once yesterday AM, but has not vomited since.  Denied nausea at this time, but reported has not eaten much today.  Denied fever/ chills.  Denied weakness or dizziness.  C/o dry mouth, but stated she feels she is urinating in adequate amts.  Took Pepto Bismol x 2 doses yesterday and a generic Anti-diarrheal medication today.  Advised, due to poss. early dehydration, and unknown source of GI bleeding, she should go to ER.  Encouraged to stay NPO until evaluated further.  Encouraged to have someone else drive her to the ER.  Verb. Understanding.  Agrees with plan.       Reason for Disposition . Tarry or jet black-colored stool (not dark green)  Answer Assessment - Initial Assessment Questions 1. COLOR: "What color is it?" "Is that color in part or all of the stool?"     Watery with black flecks of stool  2. ONSET: "When was the unusual color first noted?"     08/06/18 in AM   3. CAUSE: "Have you eaten any food or taken any medicine of this color?" (See listing in BACKGROUND)     No unusual food or medication   4. OTHER SYMPTOMS: "Do you have any other symptoms?" (e.g., diarrhea, jaundice, abdominal pain, fever).     Denied abdominal pain; vomited x 1 on 4/19 AM; denied fever, denied headache, or nausea, or continued vomiting.  Answer Assessment - Initial Assessment Questions 1. APPEARANCE of BLOOD: "What color is it?" "Is it passed separately, on the surface of the stool, or mixed in with the stool?"      Watery stool with black flecks 2. AMOUNT: "How much blood was passed?"      Denied bright red blood; only black flecks  3. FREQUENCY: "How many times has blood been passed with the  stools?"      Multiple stools per day 4. ONSET: "When was the blood first seen in the stools?" (Days or weeks)      4/19 AM 5. DIARRHEA: "Is there also some diarrhea?" If so, ask: "How many diarrhea stools were passed in past 24 hours?"      Watery stools ; approx. 20 in past 2 days;   6. CONSTIPATION: "Do you have constipation?" If so, "How bad is it?"     Does have hx. Of constipation; last normal BM was Sat. Evening; brown, formed 7. RECURRENT SYMPTOMS: "Have you had blood in your stools before?" If so, ask: "When was the last time?" and "What happened that time?"      Very small amt. And stated has hx of hemorrhoids 8. BLOOD THINNERS: "Do you take any blood thinners?" (e.g., Coumadin/warfarin, Pradaxa/dabigatran, aspirin)     Denied  9. OTHER SYMPTOMS: "Do you have any other symptoms?"  (e.g., abdominal pain, vomiting, dizziness, fever)     Denied abdominal pain or cramps, denied weakness , denied dizziness, no fever; c/o feeling thirsty; urine output is adequate 10. PREGNANCY: "Is there any chance you are pregnant?" "When was your last menstrual period?"       N/a  Protocols used: RECTAL BLEEDING-A-AH, STOOLS - UNUSUAL COLOR-A-AH

## 2018-08-07 NOTE — ED Triage Notes (Signed)
Pt reports that she having watery dark stools since yesterday. Denies pains or taking blood thinners.

## 2018-08-07 NOTE — Discharge Instructions (Signed)
Follow-up with your primary care provider. Return to ED for worsening symptoms, fever, shortness of breath, worsening abdominal pain, or vomiting.

## 2018-08-07 NOTE — ED Provider Notes (Signed)
[facility] DEPT Provider Note   CSN: [id number] Arrival date & time: 08/07/18  1804    History   Chief Complaint Chief Complaint  Patient presents with  . dark stools  . Diarrhea    HPI [name] is a 85 y.o. female with a past medical history of hypertension, fecal incontinence, history of GI bleed due to polyps, diverticulosis, hemorrhoids, who presents to ED for dark stools since yesterday.  She describes having stools which she is unable to quantify since yesterday.  She has been taking over-the-counter antidiarrhea medicine which has improved the diarrhea.  She had one episode of orange-colored emesis yesterday.  She states that this is happened to her in the past but "it just went away, I do not think I even saw anyone about it."  She denies any abdominal pain.  She cannot recall any inciting event that may have triggered the symptoms but did states she did have a meal from [facility] the night before this occurred.  No sick contacts with similar symptoms or recent travel.  She is not currently anticoagulated.  Denies any urinary symptoms, nausea, fever, alcohol use.     HPI  Past Medical History:  Diagnosis Date  . Anxiety and depression   . Back pain    lumbar  . Fecal incontinence    Dr. Michail Sermon, likely due to loose stools in conjunction with pelvic muscle laxity from aging, recommended to stop Miralax  . GI (gastrointestinal bleed)    hx of colon polyps 2004, diverticulosis, internal hemmorhids  . Headache   . Headache(784.0)   . Hydronephrosis    Right 2008 s/p urology eval CT/cysto: observation/monitor  . Hyperlipidemia   . Hypertension   . Knee pain    right chronic  . Migraine    on topamax , per neuro  . Osteoporosis   . Pulmonary nodules    [place]. First seen by CT only 05/04/03, no change on f/u 08/08/06 -Macroscopic changes rml 04/27/07 > resolved april 6,2010 no further w/u   . Seborrheic keratosis    Left inferior posterior  flank  . SVT (supraventricular tachycardia) ([place])    sees Dr.Ross    Patient Active Problem List   Diagnosis Date Noted  . Edema of both lower extremities 04/18/2018  . Chronic migraine 11/15/2017  . Memory loss 11/15/2017  . Osteoporosis 07/09/2017  . CKD (chronic kidney disease) stage 2, GFR 60-89 ml/min 01/06/2016  . Abdominal pain 09/10/2015  . Pulmonary nodules/lesions, multiple 09/10/2015  . Small bowel obstruction, partial ([place]) 09/10/2015  . Arthritis 09/10/2015  . Partial small bowel obstruction ([place]) 09/10/2015  . PCP NOTES >>>>>> 04/17/2015  . Hemorrhoids 09/17/2011  . Annual physical exam 03/19/2011  . DJD -back pain-on Celebrex   . Cough 09/25/2010  . DOE (dyspnea on exertion) 08/28/2010  . Constipation 01/21/2010  . DIVERTICULOSIS OF COLON 09/02/2009  . PERSONAL HISTORY OF COLONIC POLYPS 09/02/2009  . FULL INCONTINENCE OF FECES 09/02/2009  . Essential hypertension 08/10/2007  . Headache 08/10/2007  . Pulmonary nodules 04/25/2007  . Anxiety state 02/03/2007  . Depression 02/03/2007  . KNEE PAIN, RIGHT, CHRONIC 02/03/2007  . Hyperlipidemia 04/26/2006  . DIZZINESS 04/26/2006  . SVT (supraventricular tachycardia) ([place]) 04/26/2006    Past Surgical History:  Procedure Laterality Date  . ABDOMINAL HYSTERECTOMY    . APPENDECTOMY    . BASAL CELL CARCINOMA EXCISION    . BLADDER REPAIR    . CATARACT EXTRACTION  2008   Dr.Digby  .  CHOLECYSTECTOMY    . lumbar reconstructive surgery  01/07/2009  . R ovary removed    . ROTATOR CUFF REPAIR  9/-2011  . SHOULDER SURGERY  12-2011   LEFT  . TONSILLECTOMY    . TOTAL KNEE ARTHROPLASTY     right  . trigger finger surgery     L thumb (2014), R ring finger 05-2013     OB History   No obstetric history on file.      Home Medications    Prior to Admission medications   Medication Sig Start Date End Date Taking? Authorizing Provider  acetaminophen (TYLENOL) 500 MG tablet Take 500 mg by mouth every 6 (six) hours as  needed for moderate pain.    [provider]  amoxicillin-clavulanate (AUGMENTIN) 875-125 MG tablet Prior to dental appts    [provider]  atorvastatin (LIPITOR) 40 MG tablet Take 1 tablet (40 mg total) by mouth at bedtime. 02/06/18   Colon Branch, MD  butalbital-acetaminophen-caffeine (FIORICET, ESGIC) [phone] MG tablet Take 1 tablet by mouth every 6 (six) hours as needed for headache. Do not refill in less than one month, cancel previous Rx 02/16/18   Marcial Pacas, MD  celecoxib (CELEBREX) 200 MG capsule Take 1 capsule (200 mg total) by mouth daily as needed for moderate pain. 06/27/18   Colon Branch, MD  CRANBERRY PO Take 1 capsule by mouth daily.    [provider]  famotidine (PEPCID) 20 MG tablet One after supper 06/06/18   Tanda Rockers, MD  gabapentin (NEURONTIN) 100 MG capsule Take 2-3 capsules (200-300 mg total) by mouth 3 (three) times daily. 01/23/18   Colon Branch, MD  multivitamin [facility]) per tablet Take 1 tablet by mouth daily.      [provider]  NON FORMULARY Take 2 capsules by mouth 2 (two) times daily. [facility]    [provider]  pantoprazole (PROTONIX) 40 MG tablet Take 1 tablet (40 mg total) by mouth daily. Take 30-60 min before first meal of the day 06/26/18   Tanda Rockers, MD  Probiotic Product (PROBIOTIC DAILY PO) Take 1 capsule by mouth daily.     [provider]  propranolol ER (INDERAL LA) 60 MG 24 hr capsule Take 1 capsule (60 mg total) by mouth daily. 11/15/17   Marcial Pacas, MD    Family History Family History  Problem Relation Age of Onset  . Breast cancer Mother        later in life  . Diabetes Mother        late in life  . Heart disease Mother        late onset  . Lung cancer Father        apparently had TB  . Heart disease Father        late onset  . Prostate cancer Father   . Colitis Brother   . Cancer Brother   . Colon cancer Neg Hx     Social History Social History   Tobacco Use  .  Smoking status: Never Smoker  . Smokeless tobacco: Never Used  Substance Use Topics  . Alcohol use: Yes    Alcohol/week: 1.0 standard drinks    Types: 1 Glasses of wine per week    Comment: rarely, 1 glass of wine monthly  . Drug use: No     Allergies   Amitriptyline hcl; Carisoprodol; Codeine; Fosamax [alendronate sodium]; Meloxicam; Metronidazole; Nortriptyline hcl; Oxaprozin; Promethazine hcl; Rofecoxib; Sumatriptan; Tramadol; and  Venlafaxine   Review of Systems Review of Systems  Constitutional: Negative for appetite change, chills and fever.  HENT: Negative for ear pain, rhinorrhea, sneezing and sore throat.   Eyes: Negative for photophobia and visual disturbance.  Respiratory: Negative for cough, chest tightness, shortness of breath and wheezing.   Cardiovascular: Negative for chest pain and palpitations.  Gastrointestinal: Positive for blood in stool, diarrhea and vomiting. Negative for abdominal pain, constipation and nausea.  Genitourinary: Negative for dysuria, hematuria and urgency.  Musculoskeletal: Negative for myalgias.  Skin: Negative for rash.  Neurological: Negative for dizziness, weakness and light-headedness.     Physical Exam Updated Vital Signs BP 125/69   Pulse 74   Temp 97.7 F (36.5 C) (Oral)   Resp 18   SpO2 96%   Physical Exam Vitals signs and nursing note reviewed. Exam conducted with a chaperone present.  Constitutional:      General: She is not in acute distress.    Appearance: She is well-developed.  HENT:     Head: Normocephalic and atraumatic.     Nose: Nose normal.  Eyes:     General: No scleral icterus.       Left eye: No discharge.     Conjunctiva/sclera: Conjunctivae normal.  Neck:     Musculoskeletal: Normal range of motion and neck supple.  Cardiovascular:     Rate and Rhythm: Normal rate and regular rhythm.     Heart sounds: Normal heart sounds. No murmur. No friction rub. No gallop.   Pulmonary:     Effort: Pulmonary  effort is normal. No respiratory distress.     Breath sounds: Normal breath sounds.  Abdominal:     General: Bowel sounds are normal. There is no distension.     Palpations: Abdomen is soft.     Tenderness: There is no abdominal tenderness. There is no guarding.  Genitourinary:    Rectum: No external hemorrhoid or internal hemorrhoid.     Comments: Stool not grossly melanotic. No external abnormalities noted, but patient did apply OTC hemorrhoid ointment PTA so somewhat difficult to visualize. Musculoskeletal: Normal range of motion.  Skin:    General: Skin is warm and dry.     Findings: No rash.  Neurological:     Mental Status: She is alert.     Motor: No abnormal muscle tone.     Coordination: Coordination normal.      ED Treatments / Results  Labs (all labs ordered are listed, but only abnormal results are displayed) Labs Reviewed  COMPREHENSIVE METABOLIC PANEL - Abnormal; Notable for the following components:      Result Value   Potassium 3.3 (*)    Glucose, Bld 106 (*)    GFR calc non Af Amer 54 (*)    All other components within normal limits  CBC WITH DIFFERENTIAL/PLATELET - Abnormal; Notable for the following components:   MCV 100.5 (*)    Monocytes Absolute 1.1 (*)    All other components within normal limits  POC OCCULT BLOOD, ED  TYPE AND SCREEN    EKG None  Radiology Ct Abdomen Pelvis W Contrast  Result Date: 08/07/2018 CLINICAL DATA:  Watery, dark stools EXAM: CT ABDOMEN AND PELVIS WITH CONTRAST TECHNIQUE: Multidetector CT imaging of the abdomen and pelvis was performed using the standard protocol following bolus administration of intravenous contrast. CONTRAST:  112mL OMNIPAQUE IOHEXOL 300 MG/ML  SOLN COMPARISON:  09/10/2015 FINDINGS: LOWER CHEST: Bibasilar nodular opacities, measuring up to 6 mm, unchanged. HEPATOBILIARY: Multiple low-attenuation foci  in the right hepatic lobe, likely cysts. Status post cholecystectomy. PANCREAS: The pancreatic parenchymal  contours are normal and there is no ductal dilatation. There is no peripancreatic fluid collection. SPLEEN: Normal. ADRENALS/URINARY TRACT: --Adrenal glands: Normal. --Right kidney/ureter: Unchanged atrophy of the right kidney with dilated collecting system. --Left kidney/ureter: No hydronephrosis, nephroureterolithiasis, perinephric stranding or solid renal mass. --Urinary bladder: Normal for degree of distention STOMACH/BOWEL: --Stomach/Duodenum: There is no hiatal hernia or other gastric abnormality. The duodenal course and caliber are normal. --Small bowel: No dilatation or inflammation. --Colon: Rectosigmoid diverticulosis without acute inflammation. --Appendix: Normal. VASCULAR/LYMPHATIC: There is aortic atherosclerosis without hemodynamically significant stenosis. The portal vein, splenic vein, superior mesenteric vein and IVC are patent. No abdominal or pelvic lymphadenopathy. REPRODUCTIVE: Status post hysterectomy. No adnexal mass. MUSCULOSKELETAL. L2-L5 PLIF OTHER: None. IMPRESSION: 1. No acute abdominopelvic abnormality. 2. Unchanged cystic atrophy of the right kidney. 3. Multiple bibasilar pulmonary nodules, unchanged since 09/10/2015 and therefore benign. Electronically Signed   By: Ulyses Jarred M.D.   On: 08/07/2018 20:51    Procedures Procedures (including critical care time)  Medications Ordered in ED Medications  sodium chloride (PF) 0.9 % injection (has no administration in time range)  iohexol (OMNIPAQUE) 300 MG/ML solution 100 mL (100 mLs Intravenous Contrast Given 08/07/18 2005)  potassium chloride SA (K-DUR) CR tablet 40 mEq (40 mEq Oral Given 08/07/18 2116)     Initial Impression / Assessment and Plan / ED Course  I have reviewed the triage vital signs and the nursing notes.  Pertinent labs & imaging results that were available during my care of the patient were reviewed by me and considered in my medical decision making (see chart for details).        85 year old female  with a past medical history of diverticulosis, hemorrhoids, prior GI bleed presents to ED for dark stools, diarrhea since yesterday.  She is not currently anticoagulated.  She had one episode of orange emesis yesterday but denies any nausea or vomiting since.  She states that she has had dark stools in the past but she was never evaluated for the cause because her symptoms improved without intervention.  She cannot recall any event that may have triggered the symptoms.  She has been taking over-the-counter antidiarrheal pills with improvement in her diarrhea.  On my exam abdomen is soft, nontender nondistended.  Her vital signs are within normal limits.  She denies any shortness of breath, dizziness.  Will obtain lab work, CT of abdomen pelvis and reassess. Of note, her stool noted on DRE is not grossly Hemoccult positive.  Lab work significant for negative Hemoccult, CBC with no anemia, CMP with mild hypokalemia of 3.3 which was repleted orally.  CT of the abdomen pelvis with no acute findings.  Her vital signs remained reassuring.  Suspect viral cause of her diarrhea.  I see no evidence of GI bleeding at this time. She is hemodynamically stable.  We will have her follow-up with PCP and GI as needed.  Patient is hemodynamically stable, in NAD, and able to ambulate in the ED. Evaluation does not show pathology that would require ongoing emergent intervention or inpatient treatment. I explained the diagnosis to the patient. Pain has been managed and has no complaints prior to discharge. Patient is comfortable with above plan and is stable for discharge at this time. All questions were answered prior to disposition. Strict return precautions for returning to the ED were discussed. Encouraged follow up with PCP.   An After Visit Summary was  printed and given to the patient.   Portions of this note were generated with [employer]. Dictation errors may occur despite best attempts at proofreading.    Final Clinical Impressions(s) / ED Diagnoses   Final diagnoses:  Diarrhea, unspecified type  Hypokalemia    ED Discharge Orders    None       Delia Heady, PA-C 08/07/18 2123    Blanchie Dessert, MD 08/07/18 2237

## 2018-08-08 DIAGNOSIS — M67372 Transient synovitis, left ankle and foot: Secondary | ICD-10-CM | POA: Diagnosis not present

## 2018-08-08 NOTE — Telephone Encounter (Signed)
Per PCP- ed f/u in 2 days. Virtual visit scheduled 08/10/2018 at 1pm.

## 2018-08-08 NOTE — Telephone Encounter (Signed)
Pt seen in ED 

## 2018-08-10 ENCOUNTER — Ambulatory Visit (INDEPENDENT_AMBULATORY_CARE_PROVIDER_SITE_OTHER): Payer: Medicare HMO | Admitting: Internal Medicine

## 2018-08-10 ENCOUNTER — Other Ambulatory Visit: Payer: Self-pay

## 2018-08-10 DIAGNOSIS — R197 Diarrhea, unspecified: Secondary | ICD-10-CM | POA: Diagnosis not present

## 2018-08-10 NOTE — Progress Notes (Signed)
Subjective:    Patient ID: [name], female    DOB: 12/08/1929, 85 y.o.   MRN: [id number]  DOS:  08/10/2018 Type of visit - description: Attempted  to make this a video visit, due to technical difficulties from the patient side it was not possible  thus we proceeded with a Virtual Visit via Telephone    I connected with@ on 08/11/18 at  1:00 PM EDT by telephone and verified that I am speaking with the correct person using two identifiers.  THIS ENCOUNTER IS A VIRTUAL VISIT DUE TO COVID-19 - PATIENT WAS NOT SEEN IN THE OFFICE. PATIENT HAS CONSENTED TO VIRTUAL VISIT / TELEMEDICINE VISIT   Location of patient: home  Location of provider: office  I discussed the limitations, risks, security and privacy concerns of performing an evaluation and management service by telephone and the availability of in person appointments. I also discussed with the patient that there may be a patient responsible charge related to this service. The patient expressed understanding and agreed to proceed.   History of Present Illness:  ER f/u The patient started with diarrhea 4 days ago: Essentially watery stools,  from time to times she sees tiny black dots, no frank blood in the stools. Went to the ER 08/07/2018, potassium was slightly low, it was replaced.  White cells and hemoglobin normal, Hemoccult negative for blood, CT abdomen with no acute findings. Since she left the ER, symptoms are about the same, several episodes of watery diarrhea. Appetite is decreased but she has been successfully forcing fluids. Not taking anything for diarrhea. No recent antibiotics.  Review of Systems Denies fever chills. Had nausea and vomiting with the onset of symptoms x1 but no further problems.  No abdominal pain. Denies any orthostatic dizziness or dizziness in general History of cough and DOE: Doing okay, better than baseline.   Past Medical History:  Diagnosis Date   Anxiety and depression    Back  pain    lumbar   Fecal incontinence    Dr. Michail Sermon, likely due to loose stools in conjunction with pelvic muscle laxity from aging, recommended to stop Miralax   GI (gastrointestinal bleed)    hx of colon polyps 2004, diverticulosis, internal hemmorhids   Headache    Headache(784.0)    Hydronephrosis    Right 2008 s/p urology eval CT/cysto: observation/monitor   Hyperlipidemia    Hypertension    Knee pain    right chronic   Migraine    on topamax , per neuro   Osteoporosis    Pulmonary nodules    [place]. First seen by CT only 05/04/03, no change on f/u 08/08/06 -Macroscopic changes rml 04/27/07 > resolved april 6,2010 no further w/u    Seborrheic keratosis    Left inferior posterior flank   SVT (supraventricular tachycardia) ([place])    sees Dr.Ross    Past Surgical History:  Procedure Laterality Date   ABDOMINAL HYSTERECTOMY     APPENDECTOMY     BASAL CELL CARCINOMA EXCISION     BLADDER REPAIR     CATARACT EXTRACTION  2008   Dr.Digby   CHOLECYSTECTOMY     lumbar reconstructive surgery  01/07/2009   R ovary removed     ROTATOR CUFF REPAIR  9/-2011   SHOULDER SURGERY  12-2011   LEFT   TONSILLECTOMY     TOTAL KNEE ARTHROPLASTY     right   trigger finger surgery     L thumb (2014), R ring finger 05-2013  Social History   Socioeconomic History   Marital status: Widowed    Spouse name: Not on file   Number of children: 1   Years of education: college    Highest education level: Doctorate  Occupational History   Occupation: retired   Scientist, product/process development strain: Not on file   Food insecurity:    Worry: Not on file    Inability: Not on Lexicographer needs:    Medical: Not on file    Non-medical: Not on file  Tobacco Use   Smoking status: Never Smoker   Smokeless tobacco: Never Used  Substance and Sexual Activity   Alcohol use: Yes    Alcohol/week: 1.0 standard drinks    Types: 1 Glasses of wine per week     Comment: rarely, 1 glass of wine monthly   Drug use: No   Sexual activity: Not Currently  Lifestyle   Physical activity:    Days per week: Not on file    Minutes per session: Not on file   Stress: Not on file  Relationships   Social connections:    Talks on phone: Not on file    Gets together: Not on file    Attends religious service: Not on file    Active member of club or organization: Not on file    Attends meetings of clubs or organizations: Not on file    Relationship status: Not on file   Intimate partner violence:    Fear of current or ex partner: Not on file    Emotionally abused: Not on file    Physically abused: Not on file    Forced sexual activity: Not on file  Other Topics Concern   Not on file  Social History Narrative   Moved from a retirement home to a condominium 04-2013, independent living,  lives by herself , drives    Son lives in [place]   Right handed    1 cup of caffeine per day       Allergies as of 08/10/2018      Reactions   Amitriptyline Hcl    REACTION: hallucinations   Carisoprodol    REACTION: nightmares,rash,tachycardia   Codeine    REACTION: "deathly ill" [place], dizzy,weak   Fosamax [alendronate Sodium] Other (See Comments)   Dental issues   Meloxicam    REACTION: sensitive to sun; facial redness; may cause severe abd pain   Metronidazole    REACTION: abd pain   Nortriptyline Hcl    REACTION: hallucinations   Oxaprozin    REACTION: abd pain   Promethazine Hcl    REACTION: spastic arms and legs flailing while awake and unusual behavior while sleep - walking all over house during night   Rofecoxib    REACTION: rash   Sumatriptan    REACTION: abdominal pain, blurry vision, drowsiness   Tramadol Other (See Comments)   Dizziness and constipation   Venlafaxine    Hallucinations      Medication List       Accurate as of August 10, 2018 11:59 PM. Always use your most recent med list.        acetaminophen 500 MG  tablet Commonly known as:  TYLENOL Take 500 mg by mouth every 6 (six) hours as needed for moderate pain.   amoxicillin-clavulanate 875-125 MG tablet Commonly known as:  AUGMENTIN Prior to dental appts   atorvastatin 40 MG tablet Commonly known as:  LIPITOR Take 1 tablet (40 mg  total) by mouth at bedtime.   butalbital-acetaminophen-caffeine 50-325-40 MG tablet Commonly known as:  FIORICET Take 1 tablet by mouth every 6 (six) hours as needed for headache. Do not refill in less than one month, cancel previous Rx   celecoxib 200 MG capsule Commonly known as:  CELEBREX Take 1 capsule (200 mg total) by mouth daily as needed for moderate pain.   CRANBERRY PO Take 1 capsule by mouth daily.   famotidine 20 MG tablet Commonly known as:  Pepcid One after supper   gabapentin 100 MG capsule Commonly known as:  NEURONTIN Take 2-3 capsules (200-300 mg total) by mouth 3 (three) times daily.   multivitamin per tablet Take 1 tablet by mouth daily.   NON FORMULARY Take 2 capsules by mouth 2 (two) times daily. Hydro Eye   pantoprazole 40 MG tablet Commonly known as:  Protonix Take 1 tablet (40 mg total) by mouth daily. Take 30-60 min before first meal of the day   PROBIOTIC DAILY PO Take 1 capsule by mouth daily.   propranolol ER 60 MG 24 hr capsule Commonly known as:  Inderal LA Take 1 capsule (60 mg total) by mouth daily.           Objective:   Physical Exam There were no vitals taken for this visit. This is a virtual, phone visit.  Alert oriented x3 no apparent distress.  Coherent, cooperative.    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 SVT, saw Dr. Harrington Challenger 2014, on cardiazem NEURO: Migraines: on gabapentin, flexeril prn sees neurology ;d/c topamax ~ 08/17/17 (s/e)(Dr Domingo Cocking)   Chronic Dizzies : 2012/2013: w/u (-) including echo, MRA head-neck, Brain MRI, CT head.  06/2017 brain MRI wnl for age  Mild cognitive impairment: Per neurology note 10/2017. GI: Fecal incontinence, GI Dr. Michail Sermon H/o SBO 08-2015 Pulmonary nodules First seen by CT only 05/04/03, no change on f/u 08/08/06 -Macroscopic changes rml 04/27/07 > resolved april 6,2010 no further w/u  Nodules noted again: CT 08/2015  CT 04-2016: Stable, recheck in one year if high risk only. [facility]  Sees Dermatology  GU: Sees urology regularly, on daily antibiotics  PLAN: Acute diarrhea: Symptoms started 4 days ago, had a thorough evaluation in the ER 3 days ago, kidney function normal, no anemia, Hemoccult negative. Most likely this is an acute and self-limited process, we agreed on: Continue forcing fluids Pepto-Bismol OTC Hold Celebrex until she is completely back to normal as she is at risk of dehydration Continue with a bland diet I asked patient to call me in 3 days to let me know how she is doing. ER if: Severe symptoms, fever, chills, blood in the stools, weakness, severe dizziness. Patient verbalized understanding.  I discussed the assessment and treatment plan with the patient. The patient was provided an opportunity to ask questions and all were answered. The patient agreed with the plan and demonstrated an understanding of the instructions.   The patient was advised to call back or seek  an in-person evaluation if the symptoms worsen or if the condition fails to improve as anticipated.  I provided  15 minutes of non-face-to-face time during this encounter.  Kathlene November,  MD

## 2018-08-11 NOTE — Assessment & Plan Note (Signed)
PLAN: Acute diarrhea: Symptoms started 4 days ago, had a thorough evaluation in the ER 3 days ago, kidney function normal, no anemia, Hemoccult negative. Most likely this is an acute and self-limited process, we agreed on: Continue forcing fluids Pepto-Bismol OTC Hold Celebrex until she is completely back to normal as she is at risk of dehydration Continue with a bland diet I asked patient to call me in 3 days to let me know how she is doing. ER if: Severe symptoms, fever, chills, blood in the stools, weakness, severe dizziness. Patient verbalized understanding.

## 2018-08-14 ENCOUNTER — Telehealth: Payer: Self-pay | Admitting: Internal Medicine

## 2018-08-14 NOTE — Telephone Encounter (Signed)
She recently had work-up at the ER on the Hemoccult was negative, hemoglobin is stable.  For now, recommend to wait few more days, she needs to keep in mind that Pepto-Bismol may turn stools dark.  If she is not improving gradually in the next few days is to let me know. ? face-to-face visit.

## 2018-08-14 NOTE — Telephone Encounter (Signed)
Spoke w/ Pt- informed of recommendations. Pt verbalized understanding.  

## 2018-08-14 NOTE — Telephone Encounter (Signed)
Copied from [place] [phone]. Topic: Quick Communication - See Telephone Encounter >> Aug 14, 2018  1:33 PM Blase Mess A wrote: CRM for notification. See Telephone encounter for: 08/14/18.  Patient is calling to report to Dr. Larose Kells that her diarrhea was clear and on Saturday. However, today is black again.  Patient is weak.  However, She is eating and drinking as on the menu. No fever. Chapped lips all the time. Monostat cleared up the internal irration with urination. Please advise [phone]

## 2018-08-14 NOTE — Telephone Encounter (Signed)
FYI

## 2018-08-15 DIAGNOSIS — M109 Gout, unspecified: Secondary | ICD-10-CM | POA: Diagnosis not present

## 2018-08-15 DIAGNOSIS — Z01812 Encounter for preprocedural laboratory examination: Secondary | ICD-10-CM | POA: Diagnosis not present

## 2018-08-17 DIAGNOSIS — M109 Gout, unspecified: Secondary | ICD-10-CM | POA: Diagnosis not present

## 2018-08-17 DIAGNOSIS — M67372 Transient synovitis, left ankle and foot: Secondary | ICD-10-CM | POA: Diagnosis not present

## 2018-08-22 DIAGNOSIS — M25511 Pain in right shoulder: Secondary | ICD-10-CM | POA: Diagnosis not present

## 2018-08-22 DIAGNOSIS — M542 Cervicalgia: Secondary | ICD-10-CM | POA: Diagnosis not present

## 2018-08-22 DIAGNOSIS — M67372 Transient synovitis, left ankle and foot: Secondary | ICD-10-CM | POA: Diagnosis not present

## 2018-08-28 DIAGNOSIS — M542 Cervicalgia: Secondary | ICD-10-CM | POA: Diagnosis not present

## 2018-08-31 DIAGNOSIS — G43719 Chronic migraine without aura, intractable, without status migrainosus: Secondary | ICD-10-CM | POA: Diagnosis not present

## 2018-08-31 DIAGNOSIS — G43019 Migraine without aura, intractable, without status migrainosus: Secondary | ICD-10-CM | POA: Diagnosis not present

## 2018-09-04 DIAGNOSIS — M542 Cervicalgia: Secondary | ICD-10-CM | POA: Diagnosis not present

## 2018-09-13 DIAGNOSIS — M542 Cervicalgia: Secondary | ICD-10-CM | POA: Diagnosis not present

## 2018-09-15 ENCOUNTER — Other Ambulatory Visit: Payer: Self-pay | Admitting: Internal Medicine

## 2018-09-18 DIAGNOSIS — M542 Cervicalgia: Secondary | ICD-10-CM | POA: Diagnosis not present

## 2018-09-19 ENCOUNTER — Telehealth: Payer: Self-pay | Admitting: Physical Therapy

## 2018-09-19 NOTE — Telephone Encounter (Signed)
[name] was contacted regarding ongoing hold for [facility] due to concerns for community transmission of COVID-19.  Patient identity was verified.  Assessed if patient needed to be seen in person by clinician (recent fall or acute injury that requires hands on assessment and advice, etc.). Patient reports she has just begun seeing a PT in [place] for her neck, therefore will proceed with discharge from PT for current episode.  [name], PT, MPT  [facility] [address]  [place], [place], [address] Phone: [phone]   Fax:  [phone]

## 2018-09-22 DIAGNOSIS — M79672 Pain in left foot: Secondary | ICD-10-CM | POA: Diagnosis not present

## 2018-09-22 DIAGNOSIS — M21612 Bunion of left foot: Secondary | ICD-10-CM | POA: Diagnosis not present

## 2018-09-22 DIAGNOSIS — M2042 Other hammer toe(s) (acquired), left foot: Secondary | ICD-10-CM | POA: Diagnosis not present

## 2018-09-22 DIAGNOSIS — M79671 Pain in right foot: Secondary | ICD-10-CM | POA: Diagnosis not present

## 2018-09-22 DIAGNOSIS — M7989 Other specified soft tissue disorders: Secondary | ICD-10-CM | POA: Diagnosis not present

## 2018-09-27 DIAGNOSIS — H00024 Hordeolum internum left upper eyelid: Secondary | ICD-10-CM | POA: Diagnosis not present

## 2018-09-27 DIAGNOSIS — H02401 Unspecified ptosis of right eyelid: Secondary | ICD-10-CM | POA: Diagnosis not present

## 2018-09-27 DIAGNOSIS — H04123 Dry eye syndrome of bilateral lacrimal glands: Secondary | ICD-10-CM | POA: Diagnosis not present

## 2018-09-27 DIAGNOSIS — M542 Cervicalgia: Secondary | ICD-10-CM | POA: Diagnosis not present

## 2018-09-27 DIAGNOSIS — H00022 Hordeolum internum right lower eyelid: Secondary | ICD-10-CM | POA: Diagnosis not present

## 2018-09-29 ENCOUNTER — Emergency Department (HOSPITAL_COMMUNITY): Payer: Medicare HMO

## 2018-09-29 ENCOUNTER — Other Ambulatory Visit: Payer: Self-pay

## 2018-09-29 ENCOUNTER — Emergency Department (HOSPITAL_COMMUNITY)
Admission: EM | Admit: 2018-09-29 | Discharge: 2018-09-29 | Disposition: A | Discharge status: Home / Self Care | Payer: Medicare HMO | Attending: Emergency Medicine | Admitting: Emergency Medicine

## 2018-09-29 DIAGNOSIS — Y999 Unspecified external cause status: Secondary | ICD-10-CM | POA: Insufficient documentation

## 2018-09-29 DIAGNOSIS — Y9389 Activity, other specified: Secondary | ICD-10-CM | POA: Insufficient documentation

## 2018-09-29 DIAGNOSIS — S0990XA Unspecified injury of head, initial encounter: Secondary | ICD-10-CM | POA: Diagnosis not present

## 2018-09-29 DIAGNOSIS — M545 Low back pain: Secondary | ICD-10-CM | POA: Insufficient documentation

## 2018-09-29 DIAGNOSIS — W19XXXA Unspecified fall, initial encounter: Secondary | ICD-10-CM | POA: Diagnosis not present

## 2018-09-29 DIAGNOSIS — Y929 Unspecified place or not applicable: Secondary | ICD-10-CM | POA: Diagnosis not present

## 2018-09-29 DIAGNOSIS — R296 Repeated falls: Secondary | ICD-10-CM | POA: Diagnosis not present

## 2018-09-29 DIAGNOSIS — I10 Essential (primary) hypertension: Secondary | ICD-10-CM | POA: Diagnosis not present

## 2018-09-29 DIAGNOSIS — R339 Retention of urine, unspecified: Secondary | ICD-10-CM | POA: Diagnosis not present

## 2018-09-29 DIAGNOSIS — M5489 Other dorsalgia: Secondary | ICD-10-CM | POA: Diagnosis not present

## 2018-09-29 DIAGNOSIS — G8929 Other chronic pain: Secondary | ICD-10-CM | POA: Insufficient documentation

## 2018-09-29 DIAGNOSIS — R52 Pain, unspecified: Secondary | ICD-10-CM | POA: Diagnosis not present

## 2018-09-29 DIAGNOSIS — S3992XA Unspecified injury of lower back, initial encounter: Secondary | ICD-10-CM | POA: Diagnosis not present

## 2018-09-29 DIAGNOSIS — W010XXA Fall on same level from slipping, tripping and stumbling without subsequent striking against object, initial encounter: Secondary | ICD-10-CM | POA: Insufficient documentation

## 2018-09-29 LAB — URINALYSIS, ROUTINE W REFLEX MICROSCOPIC
Bilirubin Urine: NEGATIVE
Glucose, UA: NEGATIVE mg/dL
Hgb urine dipstick: NEGATIVE
Ketones, ur: NEGATIVE mg/dL
Leukocytes,Ua: NEGATIVE
Nitrite: NEGATIVE
Protein, ur: NEGATIVE mg/dL
Specific Gravity, Urine: 1.003 — ABNORMAL LOW (ref 1.005–1.030)
pH: 7 (ref 5.0–8.0)

## 2018-09-29 MED ORDER — ACETAMINOPHEN 500 MG PO TABS
1000.0000 mg | ORAL_TABLET | Freq: Once | ORAL | Status: AC
  Administered 2018-09-29: 1000 mg via ORAL
  Filled 2018-09-29: qty 2

## 2018-09-29 NOTE — ED Provider Notes (Signed)
[facility] DEPT Provider Note   CSN: [id number] Arrival date & time: 09/29/18  1443    History   Chief Complaint Chief Complaint  Patient presents with  . Fall  . Back Pain    HPI [name] is a 85 y.o. female.     HPI Patient reports she been having problems with falls for a number of months.  She reports she always uses a walker.  She can be up and walking and she will start to drift to one direction.  She reports she is gone to physical therapy multiple times to try to help with balance and strengthening.  It has not really resolved the problem.  She reports today she lost her balance and fell forward hitting her right cheek.  She denies loss of consciousness.  Denies significant headache.  She reports she does have some discomfort down the back of her head and neck.  She however attributes some of the neck pain that radiates into the right shoulder to her initial fall which was more severe and November.  She identifies this is chronic since that time.  She reports he also is chronically had problems with bladder incontinence.  She reports she has to stand up and get on the toilet in order to urinate.  She reports oftentimes when she stands up she just gushes urine and she cannot control it.  Again, this is been longstanding.  No fevers no chills.  She is also been having some ongoing problems with swelling of both lower extremities.  She reports she is been wearing some compression hose per her doctor's recommendations.  She reports however since she started wearing the compression hose her legs are also starting to get kind of red. Past Medical History:  Diagnosis Date  . Anxiety and depression   . Back pain    lumbar  . Fecal incontinence    Dr. Michail Sermon, likely due to loose stools in conjunction with pelvic muscle laxity from aging, recommended to stop Miralax  . GI (gastrointestinal bleed)    hx of colon polyps 2004, diverticulosis,  internal hemmorhids  . Headache   . Headache(784.0)   . Hydronephrosis    Right 2008 s/p urology eval CT/cysto: observation/monitor  . Hyperlipidemia   . Hypertension   . Knee pain    right chronic  . Migraine    on topamax , per neuro  . Osteoporosis   . Pulmonary nodules    [place]. First seen by CT only 05/04/03, no change on f/u 08/08/06 -Macroscopic changes rml 04/27/07 > resolved april 6,2010 no further w/u   . Seborrheic keratosis    Left inferior posterior flank  . SVT (supraventricular tachycardia) ([place])    sees Dr.Ross    Patient Active Problem List   Diagnosis Date Noted  . Edema of both lower extremities 04/18/2018  . Chronic migraine 11/15/2017  . Memory loss 11/15/2017  . Osteoporosis 07/09/2017  . CKD (chronic kidney disease) stage 2, GFR 60-89 ml/min 01/06/2016  . Abdominal pain 09/10/2015  . Pulmonary nodules/lesions, multiple 09/10/2015  . Small bowel obstruction, partial ([place]) 09/10/2015  . Arthritis 09/10/2015  . Partial small bowel obstruction ([place]) 09/10/2015  . PCP NOTES >>>>>> 04/17/2015  . Hemorrhoids 09/17/2011  . Annual physical exam 03/19/2011  . DJD -back pain-on Celebrex   . Cough 09/25/2010  . DOE (dyspnea on exertion) 08/28/2010  . Constipation 01/21/2010  . DIVERTICULOSIS OF COLON 09/02/2009  . PERSONAL HISTORY OF COLONIC POLYPS 09/02/2009  .  FULL INCONTINENCE OF FECES 09/02/2009  . Essential hypertension 08/10/2007  . Headache 08/10/2007  . Pulmonary nodules 04/25/2007  . Anxiety state 02/03/2007  . Depression 02/03/2007  . KNEE PAIN, RIGHT, CHRONIC 02/03/2007  . Hyperlipidemia 04/26/2006  . DIZZINESS 04/26/2006  . SVT (supraventricular tachycardia) ([place]) 04/26/2006    Past Surgical History:  Procedure Laterality Date  . ABDOMINAL HYSTERECTOMY    . APPENDECTOMY    . BASAL CELL CARCINOMA EXCISION    . BLADDER REPAIR    . CATARACT EXTRACTION  2008   Dr.Digby  . CHOLECYSTECTOMY    . lumbar reconstructive surgery  01/07/2009  . R  ovary removed    . ROTATOR CUFF REPAIR  9/-2011  . SHOULDER SURGERY  12-2011   LEFT  . TONSILLECTOMY    . TOTAL KNEE ARTHROPLASTY     right  . trigger finger surgery     L thumb (2014), R ring finger 05-2013     OB History   No obstetric history on file.      Home Medications    Prior to Admission medications   Medication Sig Start Date End Date Taking? Authorizing Provider  atorvastatin (LIPITOR) 40 MG tablet Take 1 tablet (40 mg total) by mouth at bedtime. 02/06/18  Yes Paz, Alda Berthold, MD  butalbital-acetaminophen-caffeine (FIORICET, ESGIC) [phone] MG tablet Take 1 tablet by mouth every 6 (six) hours as needed for headache. Do not refill in less than one month, cancel previous Rx 02/16/18  Yes Marcial Pacas, MD  celecoxib (CELEBREX) 200 MG capsule Take 1 capsule (200 mg total) by mouth daily as needed for moderate pain. 06/27/18  Yes Paz, Alda Berthold, MD  CRANBERRY PO Take 1 capsule by mouth daily.   Yes [provider]  famotidine (PEPCID) 20 MG tablet One after supper 06/06/18  Yes Tanda Rockers, MD  gabapentin (NEURONTIN) 100 MG capsule Take 2-3 capsules (200-300 mg total) by mouth 3 (three) times daily. 01/23/18  Yes Paz, Alda Berthold, MD  multivitamin [facility]) per tablet Take 1 tablet by mouth daily.     Yes [provider]  NON FORMULARY Take 2 capsules by mouth 2 (two) times daily. Hydro Eye   Yes [provider]  pantoprazole (PROTONIX) 40 MG tablet Take 1 tablet (40 mg total) by mouth daily. Take 30-60 min before first meal of the day 06/26/18  Yes Tanda Rockers, MD  Probiotic Product (PROBIOTIC DAILY PO) Take 1 capsule by mouth daily.    Yes [provider]  SUMAtriptan (IMITREX) 25 MG tablet Take 25 mg by mouth every 2 (two) hours as needed for headache.   Yes [provider]  propranolol ER (INDERAL LA) 60 MG 24 hr capsule Take 1 capsule (60 mg total) by mouth daily. Patient not taking: Reported on 09/29/2018 11/15/17   Marcial Pacas, MD     Family History Family History  Problem Relation Age of Onset  . Breast cancer Mother        later in life  . Diabetes Mother        late in life  . Heart disease Mother        late onset  . Lung cancer Father        apparently had TB  . Heart disease Father        late onset  . Prostate cancer Father   . Colitis Brother   . Cancer Brother   . Colon cancer Neg Hx     Social  History Social History   Tobacco Use  . Smoking status: Never Smoker  . Smokeless tobacco: Never Used  Substance Use Topics  . Alcohol use: Yes    Alcohol/week: 1.0 standard drinks    Types: 1 Glasses of wine per week    Comment: rarely, 1 glass of wine monthly  . Drug use: No     Allergies   Amitriptyline hcl, Carisoprodol, Codeine, Fosamax [alendronate sodium], Meloxicam, Metronidazole, Nortriptyline hcl, Oxaprozin, Promethazine hcl, Rofecoxib, Sumatriptan, Tramadol, and Venlafaxine   Review of Systems Review of Systems 10 Systems reviewed and are negative for acute change except as noted in the HPI.   Physical Exam Updated Vital Signs BP (!) 161/93   Pulse 73   Temp 97.7 F (36.5 C) (Oral)   Resp 16   SpO2 97%   Physical Exam Constitutional:      Appearance: She is well-developed.     Comments: As I enter the room, patient is tearful reporting that she must urinate (she does have a pure wick in place).  No respiratory distress, mental status clear.  HENT:     Head: Normocephalic and atraumatic.     Comments: Subtle contusion to right zygoma.    Mouth/Throat:     Mouth: Mucous membranes are moist.     Pharynx: Oropharynx is clear.  Eyes:     Extraocular Movements: Extraocular movements intact.     Pupils: Pupils are equal, round, and reactive to light.  Neck:     Musculoskeletal: Neck supple.  Cardiovascular:     Rate and Rhythm: Normal rate and regular rhythm.     Heart sounds: Normal heart sounds.  Pulmonary:     Effort: Pulmonary effort is normal.     Breath sounds:  Normal breath sounds.  Abdominal:     General: Bowel sounds are normal. There is distension.     Palpations: Abdomen is soft.     Tenderness: There is abdominal tenderness.     Comments: Lower abdomen is distended.  Bladder feels firm and is tender.  Musculoskeletal: Normal range of motion.     Comments: She can move both lower extremities at will and by command.  She can elevate each off the bed independently.  1-2+ pitting edema symmetric both lower extremities.  Some mild diffuse erythema bilateral lower extremities.  Patient denies any pain at the hips with range of motion.  She denies pain at the shoulders with range of motion.  She has good grip strength bilateral upper extremities.  No deformities of her extremities.  Skin:    General: Skin is warm and dry.  Neurological:     General: No focal deficit present.     Mental Status: She is alert and oriented to person, place, and time.     GCS: GCS eye subscore is 4. GCS verbal subscore is 5. GCS motor subscore is 6.     Cranial Nerves: No cranial nerve deficit.     Coordination: Coordination normal.      ED Treatments / Results  Labs (all labs ordered are listed, but only abnormal results are displayed) Labs Reviewed  URINALYSIS, ROUTINE W REFLEX MICROSCOPIC - Abnormal; Notable for the following components:      Result Value   Color, Urine COLORLESS (*)    Specific Gravity, Urine 1.003 (*)    All other components within normal limits  URINE CULTURE    EKG None  Radiology Ct Head Wo Contrast  Result Date: 09/29/2018 CLINICAL DATA:  [place].  Hit head. EXAM: CT HEAD WITHOUT CONTRAST CT CERVICAL SPINE WITHOUT CONTRAST TECHNIQUE: Multidetector CT imaging of the head and cervical spine was performed following the standard protocol without intravenous contrast. Multiplanar CT image reconstructions of the cervical spine were also generated. COMPARISON:  Head CT from 2012. FINDINGS: CT HEAD FINDINGS Brain: Age related cerebral atrophy,  ventriculomegaly and periventricular white matter disease. No acute intracranial findings such as hemispheric infarction or intracranial hemorrhage. No extra-axial fluid collections are identified. The brainstem and cerebellum are grossly normal. Vascular: Vascular calcifications without definite aneurysm or hyperdense vessels. Skull: No acute skull fracture Sinuses/Orbits: Scattered sinus disease. The mastoid air cells and middle ear cavities are clear. The globes are intact. Other: No scalp lesions. CT CERVICAL SPINE FINDINGS Alignment: Normal overall alignment. Skull base and vertebrae: Degenerative cervical spondylosis with multilevel disc disease and facet disease. There is advanced degenerative changes at C1-2 with partially calcified pannus formation. No acute cervical spine fracture is identified. The facets are normally aligned. Advanced degenerative changes but no definite facet or laminar fractures. Soft tissues and spinal canal: No prevertebral fluid or swelling. No visible canal hematoma. Disc levels: Very generous spinal canal. No significant spinal stenosis. Upper chest: The lung apices are grossly clear. Emphysematous changes are noted. Other: No neck mass or adenopathy. IMPRESSION: 1. No acute intracranial findings or skull fracture. 2. Degenerative changes involving cervical spine but no acute cervical spine fracture. Electronically Signed   By: Marijo Sanes M.D.   On: 09/29/2018 16:57   Ct Cervical Spine Wo Contrast  Result Date: 09/29/2018 CLINICAL DATA:  [place].  Hit head. EXAM: CT HEAD WITHOUT CONTRAST CT CERVICAL SPINE WITHOUT CONTRAST TECHNIQUE: Multidetector CT imaging of the head and cervical spine was performed following the standard protocol without intravenous contrast. Multiplanar CT image reconstructions of the cervical spine were also generated. COMPARISON:  Head CT from 2012. FINDINGS: CT HEAD FINDINGS Brain: Age related cerebral atrophy, ventriculomegaly and periventricular white  matter disease. No acute intracranial findings such as hemispheric infarction or intracranial hemorrhage. No extra-axial fluid collections are identified. The brainstem and cerebellum are grossly normal. Vascular: Vascular calcifications without definite aneurysm or hyperdense vessels. Skull: No acute skull fracture Sinuses/Orbits: Scattered sinus disease. The mastoid air cells and middle ear cavities are clear. The globes are intact. Other: No scalp lesions. CT CERVICAL SPINE FINDINGS Alignment: Normal overall alignment. Skull base and vertebrae: Degenerative cervical spondylosis with multilevel disc disease and facet disease. There is advanced degenerative changes at C1-2 with partially calcified pannus formation. No acute cervical spine fracture is identified. The facets are normally aligned. Advanced degenerative changes but no definite facet or laminar fractures. Soft tissues and spinal canal: No prevertebral fluid or swelling. No visible canal hematoma. Disc levels: Very generous spinal canal. No significant spinal stenosis. Upper chest: The lung apices are grossly clear. Emphysematous changes are noted. Other: No neck mass or adenopathy. IMPRESSION: 1. No acute intracranial findings or skull fracture. 2. Degenerative changes involving cervical spine but no acute cervical spine fracture. Electronically Signed   By: Marijo Sanes M.D.   On: 09/29/2018 16:57   Mr Lumbar Spine Wo Contrast  Result Date: 09/29/2018 CLINICAL DATA:  Fall EXAM: MRI LUMBAR SPINE WITHOUT CONTRAST TECHNIQUE: Multiplanar, multisequence MR imaging of the lumbar spine was performed. No intravenous contrast was administered. COMPARISON:  Lumbar spine radiograph 08/02/2012 FINDINGS: Segmentation:  Normal Alignment:  Normal Vertebrae: L2-5 PLIF. No acute fracture or aggressive osseous lesion. No discitis-osteomyelitis. Conus medullaris and cauda equina: Conus extends to  the L1 level. Conus and cauda equina appear normal. Paraspinal and  other soft tissues: Cystic atrophy of the right kidney, unchanged. Disc levels: T10-11: Disc space narrowing without stenosis. T11-12: Normal T12-L1: Normal L1-2: Mild disc bulge, left eccentric. No spinal canal stenosis. Mild left foraminal stenosis. L2-3: Posterior interbody fusion. No spinal canal or neural foraminal stenosis. L3-4: Posterior interbody fusion. No spinal canal or neural foraminal stenosis. L4-5: Posterior interbody fusion. No spinal canal or neural foraminal stenosis. L5-S1: Posterior decompression. Disc space narrowing with mild endplate spurring. No spinal canal or neural foraminal stenosis. IMPRESSION: 1. No acute fracture or static subluxation of the lumbar spine. 2. L1-2 disc bulge with mild left foraminal stenosis. 3. L2-5 PLIF in normal alignment. Electronically Signed   By: Ulyses Jarred M.D.   On: 09/29/2018 18:57    Procedures Procedures (including critical care time)  Medications Ordered in ED Medications  acetaminophen (TYLENOL) tablet 1,000 mg (has no administration in time range)     Initial Impression / Assessment and Plan / ED Course  I have reviewed the triage vital signs and the nursing notes.  Pertinent labs & imaging results that were available during my care of the patient were reviewed by me and considered in my medical decision making (see chart for details).       No evidence of neurologic injury from patient's fall.  She does describe chronic problems with gait and balance being treated with outpatient PT.  She also seems to have urinary retention.  She initially could not urinate with the pure wick in place.  Ultimately she did urinate about 200 cc with the purwick and then when standing urinated 200+ more.  Patient does report that frequently when she stands she will lose control of her bladder.  MRI was obtained due to combination of some degree of urinary retention plus gait difficulty.  No critical findings on lumbar MRI.  CT scan of head and neck  obtained to rule out any acute injuries from fall.  None present.  I have reviewed the patient's findings and history of present illness as well as ongoing medical care with the patient's son who is picking her up.  Reports the patient remains very independent and at this time is not very agreeable to increased at home assistance or possible nursing home placement.  He will continue to be in close contact with monitoring and assist for follow-up and keeping an eye on her and this immediate post fall phase.  Final Clinical Impressions(s) / ED Diagnoses   Final diagnoses:  Fall, initial encounter  Injury of head, initial encounter  Chronic midline low back pain without sciatica  Frequent falls  Urinary retention    ED Discharge Orders    None       Charlesetta Shanks, MD 09/29/18 1932

## 2018-09-29 NOTE — ED Notes (Signed)
Bed: NM07 Expected date:  Expected time:  Means of arrival:  Comments: EMS- 88, fall/back pain/hypertension

## 2018-09-29 NOTE — ED Notes (Signed)
Patient transported to MRI 

## 2018-09-29 NOTE — ED Notes (Signed)
Patient transported to CT 

## 2018-09-29 NOTE — ED Notes (Signed)
1 person assistance used to get pt to bedside commode.  Total volume output, including urine from purewick canister, totaled 800 mL.

## 2018-09-29 NOTE — Discharge Instructions (Addendum)
1.  You described problems with frequent falls and imbalance with walking.  Take care and have your walker available at all times.  Continue to work with your doctor for any additional in home assistance that may be needed.  Continue your physical therapy. 2.  You also described problems with incontinence of urine.  Sometimes this occurs because people's urine does not empty soon enough and the bladder becomes overly full.  Make an appointment to see your urologist for recheck as soon as possible. 3.  Follow instructions for head injury.  Take Tylenol if needed for pain.

## 2018-09-29 NOTE — ED Triage Notes (Signed)
Pt BIBA from from, reports bending down to get phone when she "lost my balance and fell." pt also reports inc frequency of falls since thanksgiving,   Pt reports falling on right side, right cheek.  Denies head or neck pain, does c/o back pain.  Denies LOC.  Denies taking blood thinners.

## 2018-09-29 NOTE — ED Notes (Signed)
Pt ambulated w/ walker well and with very minor assistance.

## 2018-10-03 LAB — URINE CULTURE: Culture: 20000 — AB

## 2018-10-04 ENCOUNTER — Telehealth: Payer: Self-pay | Admitting: Emergency Medicine

## 2018-10-04 NOTE — Progress Notes (Signed)
ED Antimicrobial Stewardship Positive Culture Follow Up   [name] is an 85 y.o. female who presented to [facility] on 09/29/2018 with a chief complaint of  Chief Complaint  Patient presents with  . Fall  . Back Pain    Recent Results (from the past 720 hour(s))  Urine culture     Status: Abnormal   Collection Time: 09/29/18  5:00 PM   Specimen: Urine, Clean Catch  Result Value Ref Range Status   Specimen Description   Final    URINE, CLEAN CATCH Performed at [facility], [place] [address], [place], [place] [address]    Special Requests   Final    NONE Performed at [facility], [place] [address] [place], [place] [address]    Culture 20,000 COLONIES/mL KLEBSIELLA PNEUMONIAE (A)  Final   Report Status 10/03/2018 FINAL  Final   Organism ID, Bacteria KLEBSIELLA PNEUMONIAE (A)  Final      Susceptibility   Klebsiella pneumoniae - MIC*    AMPICILLIN >=32 RESISTANT Resistant     CEFAZOLIN <=4 SENSITIVE Sensitive     CEFTRIAXONE <=1 SENSITIVE Sensitive     CIPROFLOXACIN <=0.25 SENSITIVE Sensitive     GENTAMICIN <=1 SENSITIVE Sensitive     IMIPENEM <=0.25 SENSITIVE Sensitive     NITROFURANTOIN 128 RESISTANT Resistant     TRIMETH/SULFA 40 SENSITIVE Sensitive     AMPICILLIN/SULBACTAM >=32 RESISTANT Resistant     PIP/TAZO <=4 SENSITIVE Sensitive     Extended ESBL NEGATIVE Sensitive     * 20,000 COLONIES/mL KLEBSIELLA PNEUMONIAE    Patient with chronic gait/balance issues and chronic bladder incontinence. No signs of acute infection. Likely contaminant, no antibiotics needed.  ED Provider: Suella Broad, PA   [place] 10/04/2018, 1:55 PM Clinical Pharmacist [phone]

## 2018-10-04 NOTE — Telephone Encounter (Signed)
Post ED Visit - Positive Culture Follow-up  Culture report reviewed by antimicrobial stewardship pharmacist: [place] Team []  Elenor Quinones, Pharm.D. []  Heide Guile, Pharm.D., BCPS AQ-ID []  Parks Neptune, Pharm.D., BCPS []  Alycia Rossetti, Pharm.D., BCPS []  [place], Pharm.D., BCPS, AAHIVP []  Legrand Como, Pharm.D., BCPS, AAHIVP []  Salome Arnt, PharmD, BCPS []  Johnnette Gourd, PharmD, BCPS []  Hughes Better, PharmD, BCPS []  Leeroy Cha, PharmD []  Laqueta Linden, PharmD, BCPS []  Albertina Parr, PharmD  [place] Team []  Leodis Sias, PharmD []  Lindell Spar, PharmD []  Royetta Asal, PharmD []  Graylin Shiver, Rph []  Rema Fendt) Glennon Mac, PharmD []  Arlyn Dunning, PharmD []  Netta Cedars, PharmD []  Dia Sitter, PharmD []  Leone Haven, PharmD []  Gretta Arab, PharmD [x]  Theodis Shove, PharmD []  Peggyann Juba, PharmD []  Reuel Boom, PharmD   Positive urine culture  Asymptomatic,no further patient follow-up is required at this time.  Hazle Nordmann 10/04/2018, 3:36 PM

## 2018-10-05 ENCOUNTER — Ambulatory Visit (INDEPENDENT_AMBULATORY_CARE_PROVIDER_SITE_OTHER): Payer: BC Managed Care – PPO | Admitting: Internal Medicine

## 2018-10-05 ENCOUNTER — Other Ambulatory Visit: Payer: Self-pay

## 2018-10-05 DIAGNOSIS — R0609 Other forms of dyspnea: Secondary | ICD-10-CM

## 2018-10-05 NOTE — Progress Notes (Signed)
  Unable to reach the patient on 10/05/2018

## 2018-10-06 ENCOUNTER — Ambulatory Visit (INDEPENDENT_AMBULATORY_CARE_PROVIDER_SITE_OTHER): Payer: Medicare HMO | Admitting: Internal Medicine

## 2018-10-06 ENCOUNTER — Other Ambulatory Visit: Payer: Self-pay

## 2018-10-06 DIAGNOSIS — W19XXXD Unspecified fall, subsequent encounter: Secondary | ICD-10-CM

## 2018-10-06 DIAGNOSIS — M7989 Other specified soft tissue disorders: Secondary | ICD-10-CM

## 2018-10-06 DIAGNOSIS — R269 Unspecified abnormalities of gait and mobility: Secondary | ICD-10-CM | POA: Diagnosis not present

## 2018-10-06 NOTE — Progress Notes (Signed)
Subjective:    Patient ID: [name], female    DOB: [birth year], 85 y.o.   MRN: [id number]  DOS:  10/06/2018 Type of visit - description: Attempted  to make this a video visit, due to technical difficulties from the patient side it was not possible  thus we proceeded with a Virtual Visit via Telephone    I connected with@ on 10/06/18 at  3:40 PM EDT by telephone and verified that I am speaking with the correct person using two identifiers.  THIS ENCOUNTER IS A VIRTUAL VISIT DUE TO COVID-19 - PATIENT WAS NOT SEEN IN THE OFFICE. PATIENT HAS CONSENTED TO VIRTUAL VISIT / TELEMEDICINE VISIT   Location of patient: home  Location of provider: office  I discussed the limitations, risks, security and privacy concerns of performing an evaluation and management service by telephone and the availability of in person appointments. I also discussed with the patient that there may be a patient responsible charge related to this service. The patient expressed understanding and agreed to proceed.   History of Present Illness: Follow-up The patient went to the ER 09/29/2018 after she lost her balance and landed on her right side. Work-up at the ER including CT head and CT spine with no acute findings. She also temporarily have difficulty urinating and that led to an MRI of the lumbar spine: No acute findings.  Since the ER visit, had no further falls. Denies any urinary problems at this point.  She also is telling me that her left leg yesterday was swollen and red, "red like sunburn". Today however the swelling has decreased the redness is much improved except around her ankles. The right leg is swollen but to a lesser degree. She does not know if she injured her leg during the fall recently.   Review of Systems No fever chills No chest pain no difficulty breathing  No nausea or vomiting She has neck pain on and off, is a slightly worse than baseline since the fall.  Denies any unusual  paresthesias of the upper or lower extremities When asked about a headache she said that she is having headaches, but they are similar to previous HAs  Past Medical History:  Diagnosis Date  . Anxiety and depression   . Back pain    lumbar  . Fecal incontinence    Dr. Michail Sermon, likely due to loose stools in conjunction with pelvic muscle laxity from aging, recommended to stop Miralax  . GI (gastrointestinal bleed)    hx of colon polyps 2004, diverticulosis, internal hemmorhids  . Headache   . Headache(784.0)   . Hydronephrosis    Right 2008 s/p urology eval CT/cysto: observation/monitor  . Hyperlipidemia   . Hypertension   . Knee pain    right chronic  . Migraine    on topamax , per neuro  . Osteoporosis   . Pulmonary nodules    Wert. First seen by CT only 05/04/03, no change on f/u 08/08/06 -Macroscopic changes rml 04/27/07 > resolved april 6,2010 no further w/u   . Seborrheic keratosis    Left inferior posterior flank  . SVT (supraventricular tachycardia) (HCC)    sees Dr.Ross    Past Surgical History:  Procedure Laterality Date  . ABDOMINAL HYSTERECTOMY    . APPENDECTOMY    . BASAL CELL CARCINOMA EXCISION    . BLADDER REPAIR    . CATARACT EXTRACTION  2008   Dr.Digby  . CHOLECYSTECTOMY    . lumbar reconstructive surgery  01/07/2009  .  R ovary removed    . ROTATOR CUFF REPAIR  9/-2011  . SHOULDER SURGERY  12-2011   LEFT  . TONSILLECTOMY    . TOTAL KNEE ARTHROPLASTY     right  . trigger finger surgery     L thumb (2014), R ring finger 05-2013    Social History   Socioeconomic History  . Marital status: Widowed    Spouse name: Not on file  . Number of children: 1  . Years of education: college   . Highest education level: Doctorate  Occupational History  . Occupation: retired   Scientific laboratory technician  . Financial resource strain: Not on file  . Food insecurity    Worry: Not on file    Inability: Not on file  . Transportation needs    Medical: Not on file     Non-medical: Not on file  Tobacco Use  . Smoking status: Never Smoker  . Smokeless tobacco: Never Used  Substance and Sexual Activity  . Alcohol use: Yes    Alcohol/week: 1.0 standard drinks    Types: 1 Glasses of wine per week    Comment: rarely, 1 glass of wine monthly  . Drug use: No  . Sexual activity: Not Currently  Lifestyle  . Physical activity    Days per week: Not on file    Minutes per session: Not on file  . Stress: Not on file  Relationships  . Social Herbalist on phone: Not on file    Gets together: Not on file    Attends religious service: Not on file    Active member of club or organization: Not on file    Attends meetings of clubs or organizations: Not on file    Relationship status: Not on file  . Intimate partner violence    Fear of current or ex partner: Not on file    Emotionally abused: Not on file    Physically abused: Not on file    Forced sexual activity: Not on file  Other Topics Concern  . Not on file  Social History Narrative   Moved from a retirement home to a condominium 04-2013, independent living,  lives by herself , drives    Son lives in [place]   Right handed    1 cup of caffeine per day       Allergies as of 10/06/2018      Reactions   Amitriptyline Hcl    REACTION: hallucinations   Carisoprodol    REACTION: nightmares,rash,tachycardia   Codeine    REACTION: "deathly ill" nauesa, dizzy,weak   Fosamax [alendronate Sodium] Other (See Comments)   Dental issues   Meloxicam    REACTION: sensitive to sun; facial redness; may cause severe abd pain   Metronidazole    REACTION: abd pain   Nortriptyline Hcl    REACTION: hallucinations   Oxaprozin    REACTION: abd pain   Promethazine Hcl    REACTION: spastic arms and legs flailing while awake and unusual behavior while sleep - walking all over house during night   Rofecoxib    REACTION: rash   Sumatriptan    REACTION: abdominal pain, blurry vision, drowsiness   Tramadol  Other (See Comments)   Dizziness and constipation   Venlafaxine    Hallucinations      Medication List       Accurate as of October 06, 2018  3:47 PM. If you have any questions, ask your nurse or doctor.  atorvastatin 40 MG tablet Commonly known as: LIPITOR Take 1 tablet (40 mg total) by mouth at bedtime.   butalbital-acetaminophen-caffeine 50-325-40 MG tablet Commonly known as: FIORICET Take 1 tablet by mouth every 6 (six) hours as needed for headache. Do not refill in less than one month, cancel previous Rx   celecoxib 200 MG capsule Commonly known as: CELEBREX Take 1 capsule (200 mg total) by mouth daily as needed for moderate pain.   CRANBERRY PO Take 1 capsule by mouth daily.   famotidine 20 MG tablet Commonly known as: Pepcid One after supper   gabapentin 100 MG capsule Commonly known as: NEURONTIN Take 2-3 capsules (200-300 mg total) by mouth 3 (three) times daily.   multivitamin per tablet Take 1 tablet by mouth daily.   NON FORMULARY Take 2 capsules by mouth 2 (two) times daily. Hydro Eye   pantoprazole 40 MG tablet Commonly known as: Protonix Take 1 tablet (40 mg total) by mouth daily. Take 30-60 min before first meal of the day   PROBIOTIC DAILY PO Take 1 capsule by mouth daily.   propranolol ER 60 MG 24 hr capsule Commonly known as: Inderal LA Take 1 capsule (60 mg total) by mouth daily.   SUMAtriptan 25 MG tablet Commonly known as: IMITREX Take 25 mg by mouth every 2 (two) hours as needed for headache.           Objective:   Physical Exam There were no vitals taken for this visit. This is a virtual telephone evaluation, she is alert oriented x3, she seems to recall all the details from her recent visit to the ER. She is apprehensive, that is her baseline. Speech is fluent.    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 SVT, saw Dr. Harrington Challenger 2014, on cardiazem NEURO: Migraines: on gabapentin, flexeril prn sees neurology ;d/c topamax ~ 08/17/17 (s/e)(Dr Domingo Cocking)   Chronic Dizzies : 2012/2013: w/u (-) including echo, MRA head-neck, Brain MRI, CT head.  06/2017 brain MRI wnl for age  Mild cognitive impairment: Per neurology note 10/2017. GI: Fecal incontinence, GI Dr. Michail Sermon H/o SBO 08-2015 Pulmonary nodules First seen by CT only 05/04/03, no change on f/u 08/08/06 -Macroscopic changes rml 04/27/07 > resolved april 6,2010 no further w/u  Nodules noted again: CT 08/2015  CT 04-2016: Stable, recheck in one year if high risk only. [facility]  Sees Dermatology  GU: Sees urology regularly, on daily antibiotics  PLAN: Fall: Since she had a fall, went to the ER, CT head-neck and MRI of the lumbar spine with no acute problems. She is having some more intense neck pain from baseline.  Will reassess on RTC. Request home PT, will arrange. Leg swelling and redness: I am somewhat concerned about the description of her leg, the left one is red and swollen however today it has spontaneously improve.  I explained her that I can evaluate that over the phone I need to see her; today is Friday, will get her here next  week.  In the meantime recommend leg elevation, go to the ER if worse.  She agrees with the plan.      I discussed the assessment and treatment plan with the patient. The patient was provided an opportunity to ask questions and all were answered. The patient agreed with the plan and demonstrated an understanding of the  instructions.   The patient was advised to call back or seek an in-person evaluation if the symptoms worsen or if the condition fails to improve as anticipated.  I provided  25 minutes of non-face-to-face time during this encounter.  Kathlene November, MD

## 2018-10-08 NOTE — Assessment & Plan Note (Signed)
Fall: Since she had a fall, went to the ER, CT head-neck and MRI of the lumbar spine with no acute problems. She is having some more intense neck pain from baseline.  Will reassess on RTC. Request home PT, will arrange. Leg swelling and redness: I am somewhat concerned about the description of her leg, the left one is red and swollen however today it has spontaneously improve.  I explained her that I can evaluate that over the phone I need to see her; today is Friday, will get her here next week.  In the meantime recommend leg elevation, go to the ER if worse.  She agrees with the plan.

## 2018-10-09 ENCOUNTER — Other Ambulatory Visit: Payer: Self-pay | Admitting: Obstetrics and Gynecology

## 2018-10-09 ENCOUNTER — Telehealth: Payer: Self-pay

## 2018-10-09 DIAGNOSIS — R296 Repeated falls: Secondary | ICD-10-CM

## 2018-10-09 DIAGNOSIS — Z6829 Body mass index (BMI) 29.0-29.9, adult: Secondary | ICD-10-CM | POA: Diagnosis not present

## 2018-10-09 DIAGNOSIS — N3941 Urge incontinence: Secondary | ICD-10-CM | POA: Diagnosis not present

## 2018-10-09 DIAGNOSIS — Z1231 Encounter for screening mammogram for malignant neoplasm of breast: Secondary | ICD-10-CM

## 2018-10-09 DIAGNOSIS — Z01419 Encounter for gynecological examination (general) (routine) without abnormal findings: Secondary | ICD-10-CM | POA: Diagnosis not present

## 2018-10-09 DIAGNOSIS — R269 Unspecified abnormalities of gait and mobility: Secondary | ICD-10-CM

## 2018-10-09 DIAGNOSIS — Z124 Encounter for screening for malignant neoplasm of cervix: Secondary | ICD-10-CM | POA: Diagnosis not present

## 2018-10-09 NOTE — Telephone Encounter (Signed)
Referral placed to [facility].  

## 2018-10-09 NOTE — Addendum Note (Signed)
Addended byDamita Dunnings D on: 10/09/2018 07:38 AM   Modules accepted: Orders

## 2018-10-09 NOTE — Telephone Encounter (Signed)
Copied from [place] [phone]. Topic: Quick Communication - See Telephone Encounter >> Oct 09, 2018  3:32 PM Loma Boston wrote: CRM for notification. See Telephone encounter for: 10/09/18.Pt called and said Dr Larose Kells did a referral today for her to [facility] and she found that she could go to [facility] for only $10.00 vs [facility] $40.00 Would Dr Larose Kells please resend that to [facility] at [phone]. It used to be [facility]

## 2018-10-10 ENCOUNTER — Telehealth: Payer: Self-pay | Admitting: Internal Medicine

## 2018-10-10 NOTE — Telephone Encounter (Signed)
Noted  

## 2018-10-10 NOTE — Telephone Encounter (Signed)
Home Health Verbal Orders - Caller/Agency: [facility] Callback Number: [phone] Requesting OT/PT/Skilled Nursing/Social Work/Speech Therapy: No need for referral. Pt is being seen at [facility]

## 2018-10-12 ENCOUNTER — Encounter: Payer: Self-pay | Admitting: Internal Medicine

## 2018-10-12 ENCOUNTER — Other Ambulatory Visit: Payer: Self-pay

## 2018-10-12 ENCOUNTER — Ambulatory Visit (HOSPITAL_BASED_OUTPATIENT_CLINIC_OR_DEPARTMENT_OTHER)
Admission: RE | Admit: 2018-10-12 | Discharge: 2018-10-12 | Disposition: A | Discharge status: Home / Self Care | Payer: Medicare HMO | Source: Ambulatory Visit | Attending: Internal Medicine | Admitting: Internal Medicine

## 2018-10-12 ENCOUNTER — Ambulatory Visit (INDEPENDENT_AMBULATORY_CARE_PROVIDER_SITE_OTHER): Payer: Medicare HMO | Admitting: Internal Medicine

## 2018-10-12 VITALS — BP 156/78 | HR 95 | Temp 97.8°F | Resp 16 | Ht 64.0 in | Wt 162.4 lb

## 2018-10-12 DIAGNOSIS — R600 Localized edema: Secondary | ICD-10-CM | POA: Diagnosis not present

## 2018-10-12 DIAGNOSIS — I10 Essential (primary) hypertension: Secondary | ICD-10-CM | POA: Diagnosis not present

## 2018-10-12 DIAGNOSIS — L03119 Cellulitis of unspecified part of limb: Secondary | ICD-10-CM

## 2018-10-12 DIAGNOSIS — M7989 Other specified soft tissue disorders: Secondary | ICD-10-CM | POA: Diagnosis not present

## 2018-10-12 DIAGNOSIS — W19XXXD Unspecified fall, subsequent encounter: Secondary | ICD-10-CM | POA: Diagnosis not present

## 2018-10-12 LAB — BASIC METABOLIC PANEL
BUN: 11 mg/dL (ref 6–23)
CO2: 26 mEq/L (ref 19–32)
Calcium: 9 mg/dL (ref 8.4–10.5)
Chloride: 107 mEq/L (ref 96–112)
Creatinine, Ser: 0.78 mg/dL (ref 0.40–1.20)
GFR: 69.91 mL/min (ref >60.00)
Glucose, Bld: 95 mg/dL (ref 70–99)
Potassium: 3.9 mEq/L (ref 3.5–5.1)
Sodium: 143 mEq/L (ref 135–145)

## 2018-10-12 LAB — CBC WITH DIFFERENTIAL/PLATELET
Basophils Absolute: 0 10*3/uL (ref 0.0–0.1)
Basophils Relative: 0.7 % (ref 0.0–3.0)
Eosinophils Absolute: 0.2 10*3/uL (ref 0.0–0.7)
Eosinophils Relative: 3.2 % (ref 0.0–5.0)
HCT: 37.3 % (ref 36.0–46.0)
Hemoglobin: 12.5 g/dL (ref 12.0–15.0)
Lymphocytes Relative: 18.6 % (ref 12.0–46.0)
Lymphs Abs: 1.3 10*3/uL (ref 0.7–4.0)
MCHC: 33.4 g/dL (ref 30.0–36.0)
MCV: 94.4 fl (ref 78.0–100.0)
Monocytes Absolute: 0.7 10*3/uL (ref 0.1–1.0)
Monocytes Relative: 10.5 % (ref 3.0–12.0)
Neutro Abs: 4.7 10*3/uL (ref 1.4–7.7)
Neutrophils Relative %: 67 % (ref 43.0–77.0)
Platelets: 338 10*3/uL (ref 150.0–400.0)
RBC: 3.95 Mil/uL (ref 3.87–5.11)
RDW: 13.7 % (ref 11.5–15.5)
WBC: 7 10*3/uL (ref 4.0–10.5)

## 2018-10-12 MED ORDER — CEPHALEXIN 500 MG PO CAPS: 500.0000 mg | ORAL_CAPSULE | Freq: Four times a day (QID) | ORAL | 0 refills | Status: DC

## 2018-10-12 NOTE — Progress Notes (Signed)
Subjective:    Patient ID: [name], female    DOB: 01-07-1930, 85 y.o.   MRN: [id number]  DOS:  10/12/2018 Type of visit - description: f/u Patient here my request, at the last visit she complained of bilateral leg swelling, worse over the left, associated with redness. Since last week when we talked, the patient reports the problem is the same or worse, continue with redness, swelling and pain mostly at the left foot.  She also had a fall, no further problems. She also c/o  neck pain: Better HTN: BP elevated today, at home is better.  Last potassium was slightly low.  BP Readings from Last 3 Encounters:  10/12/18 (!) 156/78  09/29/18 (!) 183/82  08/07/18 (!) 169/79     Review of Systems Denies fever or chills. Denies chest pain, difficulty breathing or orthopnea. No claudication.  Past Medical History:  Diagnosis Date  . Anxiety and depression   . Back pain    lumbar  . Fecal incontinence    Dr. Michail Sermon, likely due to loose stools in conjunction with pelvic muscle laxity from aging, recommended to stop Miralax  . GI (gastrointestinal bleed)    hx of colon polyps 2004, diverticulosis, internal hemmorhids  . Headache   . Headache(784.0)   . Hydronephrosis    Right 2008 s/p urology eval CT/cysto: observation/monitor  . Hyperlipidemia   . Hypertension   . Knee pain    right chronic  . Migraine    on topamax , per neuro  . Osteoporosis   . Pulmonary nodules    Wert. First seen by CT only 05/04/03, no change on f/u 08/08/06 -Macroscopic changes rml 04/27/07 > resolved april 6,2010 no further w/u   . Seborrheic keratosis    Left inferior posterior flank  . SVT (supraventricular tachycardia) (HCC)    sees Dr.Ross    Past Surgical History:  Procedure Laterality Date  . ABDOMINAL HYSTERECTOMY    . APPENDECTOMY    . BASAL CELL CARCINOMA EXCISION    . BLADDER REPAIR    . CATARACT EXTRACTION  2008   Dr.Digby  . CHOLECYSTECTOMY    . lumbar reconstructive  surgery  01/07/2009  . R ovary removed    . ROTATOR CUFF REPAIR  9/-2011  . SHOULDER SURGERY  12-2011   LEFT  . TONSILLECTOMY    . TOTAL KNEE ARTHROPLASTY     right  . trigger finger surgery     L thumb (2014), R ring finger 05-2013    Social History   Socioeconomic History  . Marital status: Widowed    Spouse name: Not on file  . Number of children: 1  . Years of education: college   . Highest education level: Doctorate  Occupational History  . Occupation: retired   Scientific laboratory technician  . Financial resource strain: Not on file  . Food insecurity    Worry: Not on file    Inability: Not on file  . Transportation needs    Medical: Not on file    Non-medical: Not on file  Tobacco Use  . Smoking status: Never Smoker  . Smokeless tobacco: Never Used  Substance and Sexual Activity  . Alcohol use: Yes    Alcohol/week: 1.0 standard drinks    Types: 1 Glasses of wine per week    Comment: rarely, 1 glass of wine monthly  . Drug use: No  . Sexual activity: Not Currently  Lifestyle  . Physical activity    Days per week:  Not on file    Minutes per session: Not on file  . Stress: Not on file  Relationships  . Social Herbalist on phone: Not on file    Gets together: Not on file    Attends religious service: Not on file    Active member of club or organization: Not on file    Attends meetings of clubs or organizations: Not on file    Relationship status: Not on file  . Intimate partner violence    Fear of current or ex partner: Not on file    Emotionally abused: Not on file    Physically abused: Not on file    Forced sexual activity: Not on file  Other Topics Concern  . Not on file  Social History Narrative   Moved from a retirement home to a condominium 04-2013, independent living,  lives by herself , drives    Son lives in [place]   Right handed    1 cup of caffeine per day       Allergies as of 10/12/2018      Reactions   Amitriptyline Hcl    REACTION:  hallucinations   Carisoprodol    REACTION: nightmares,rash,tachycardia   Codeine    REACTION: "deathly ill" nauesa, dizzy,weak   Fosamax [alendronate Sodium] Other (See Comments)   Dental issues   Meloxicam    REACTION: sensitive to sun; facial redness; may cause severe abd pain   Metronidazole    REACTION: abd pain   Nortriptyline Hcl    REACTION: hallucinations   Oxaprozin    REACTION: abd pain   Promethazine Hcl    REACTION: spastic arms and legs flailing while awake and unusual behavior while sleep - walking all over house during night   Rofecoxib    REACTION: rash   Sumatriptan    REACTION: abdominal pain, blurry vision, drowsiness   Tramadol Other (See Comments)   Dizziness and constipation   Venlafaxine    Hallucinations      Medication List       Accurate as of October 12, 2018 11:27 AM. If you have any questions, ask your nurse or doctor.        atorvastatin 40 MG tablet Commonly known as: LIPITOR Take 1 tablet (40 mg total) by mouth at bedtime.   butalbital-acetaminophen-caffeine 50-325-40 MG tablet Commonly known as: FIORICET Take 1 tablet by mouth every 6 (six) hours as needed for headache. Do not refill in less than one month, cancel previous Rx   celecoxib 200 MG capsule Commonly known as: CELEBREX Take 1 capsule (200 mg total) by mouth daily as needed for moderate pain.   CRANBERRY PO Take 1 capsule by mouth daily.   famotidine 20 MG tablet Commonly known as: Pepcid One after supper   gabapentin 100 MG capsule Commonly known as: NEURONTIN Take 2-3 capsules (200-300 mg total) by mouth 3 (three) times daily.   multivitamin per tablet Take 1 tablet by mouth daily.   NON FORMULARY Take 2 capsules by mouth 2 (two) times daily. Hydro Eye   pantoprazole 40 MG tablet Commonly known as: Protonix Take 1 tablet (40 mg total) by mouth daily. Take 30-60 min before first meal of the day   PROBIOTIC DAILY PO Take 1 capsule by mouth daily.    propranolol ER 60 MG 24 hr capsule Commonly known as: Inderal LA Take 1 capsule (60 mg total) by mouth daily.   SUMAtriptan 25 MG tablet Commonly known as: IMITREX  Take 25 mg by mouth every 2 (two) hours as needed for headache.           Objective:   Physical Exam BP (!) 156/78 (BP Location: Right Arm, Patient Position: Sitting, Cuff Size: Small)   Pulse 95   Temp 97.8 F (36.6 C) (Oral)   Resp 16   Ht 5\' 4"  (1.626 m)   Wt 162 lb 6 oz (73.7 kg)   SpO2 96%   BMI 27.87 kg/m  General:   Well developed, NAD, BMI noted. HEENT:  Normocephalic . Face symmetric, atraumatic Lungs:  CTA B Normal respiratory effort, no intercostal retractions, no accessory muscle use. Heart: RRR,  no murmur.  Lower extremities: See picture, she has pitting edema from the knee down, mild but more noticeable on the left. She is very sensitive to touch (in a sock distribution) at the left lower extremity, skin is slightly red & warm particularly at the distal dorsum of the left foot. No openings or wounds that I can tell. Skin: Not pale. Not jaundice Neurologic:  alert & oriented X3.  Speech normal, gait assisted by a cane Psych--  Cognition and judgment appear intact.  Cooperative with normal attention span and concentration.  Behavior appropriate. No anxious or depressed appearing.        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 SVT, saw Dr. Harrington Challenger 2014, on cardiazem  NEURO: Migraines: on gabapentin, flexeril prn sees neurology ;d/c topamax ~ 08/17/17 (s/e)(Dr Domingo Cocking)   Chronic Dizzies : 2012/2013: w/u (-) including echo, MRA head-neck, Brain MRI, CT head.  06/2017 brain MRI wnl for age  Mild cognitive impairment: Per neurology note 10/2017. GI: Fecal incontinence, GI Dr. Michail Sermon H/o SBO 08-2015 Pulmonary nodules First seen by CT only 05/04/03, no change on f/u 08/08/06 -Macroscopic changes rml 04/27/07 > resolved april 6,2010 no further w/u  Nodules noted again: CT 08/2015  CT 04-2016: Stable, recheck in one year if high risk only. [facility]  Sees Dermatology  GU: Sees urology regularly, on daily antibiotics  PLAN: Fall: No further falls since the last office visit She also reported neck pain, that has improved. We referred her for physical therapy at [facility], they have not contact her just yet. Leg swelling, worse on the left: Etiology not completely clear, DDX includes a DVT and/or cellulitis. She reports a fracture at the left dorsum of the foot several months ago but this particular episode of swelling and redness started recently. Plan: Ultrasound rule out DVT, empiric Keflex, keep leg, leg elevation, reassess in 3 weeks, call if no better HTN: Currently on Inderal. Used to be on diltiazem but that was stopped last cardiology 04/18/2018.  BP today is elevated, home BPs  130s/90. Recommend to continue monitoring. Last potassium is slightly low, recheck today RTC 3 weeks

## 2018-10-12 NOTE — Patient Instructions (Signed)
Get the blood work     [facility] Schedule your next appointment for a follow up in 3 weeks      Check the  blood pressure 2 or 3 times a week  Be sure your blood pressure is between 110/65 and  135/85. If it is consistently higher or lower, let me know   Take antibiotics as prescribed for possible infection on your left foot  Keep your legs elevated to help decrease the swelling  Call if you are not getting much better in the next couple of weeks

## 2018-10-12 NOTE — Progress Notes (Signed)
Pre visit review using our clinic review tool, if applicable. No additional management support is needed unless otherwise documented below in the visit note. 

## 2018-10-13 ENCOUNTER — Other Ambulatory Visit (HOSPITAL_BASED_OUTPATIENT_CLINIC_OR_DEPARTMENT_OTHER): Payer: BC Managed Care – PPO

## 2018-10-13 ENCOUNTER — Other Ambulatory Visit (HOSPITAL_COMMUNITY)
Admission: RE | Admit: 2018-10-13 | Discharge: 2018-10-13 | Disposition: A | Discharge status: Home / Self Care | Payer: Medicare HMO | Source: Ambulatory Visit | Attending: Internal Medicine | Admitting: Internal Medicine

## 2018-10-13 DIAGNOSIS — Z1159 Encounter for screening for other viral diseases: Secondary | ICD-10-CM | POA: Insufficient documentation

## 2018-10-13 LAB — SARS CORONAVIRUS 2 (TAT 6-24 HRS): SARS Coronavirus 2: NEGATIVE

## 2018-10-13 NOTE — Assessment & Plan Note (Signed)
Fall: No further falls since the last office visit She also reported neck pain, that has improved. We referred her for physical therapy at [facility], they have not contact her just yet. Leg swelling, worse on the left: Etiology not completely clear, DDX includes a DVT and/or cellulitis. She reports a fracture at the left dorsum of the foot several months ago but this particular episode of swelling and redness started recently. Plan: Ultrasound rule out DVT, empiric Keflex, keep leg, leg elevation, reassess in 3 weeks, call if no better HTN: Currently on Inderal. Used to be on diltiazem but that was stopped last cardiology 04/18/2018.  BP today is elevated, home BPs  130s/90. Recommend to continue monitoring. Last potassium is slightly low, recheck today RTC 3 weeks

## 2018-10-16 ENCOUNTER — Telehealth: Payer: Self-pay | Admitting: Internal Medicine

## 2018-10-16 NOTE — Telephone Encounter (Signed)
Spoke with pt, she has an appointment tomorrow so I am guessing Magda Paganini call her to do the covid screening. Covid screening is negative. Nothing further is needed.

## 2018-10-17 ENCOUNTER — Other Ambulatory Visit: Payer: Self-pay

## 2018-10-17 ENCOUNTER — Encounter: Payer: Self-pay | Admitting: Internal Medicine

## 2018-10-17 ENCOUNTER — Ambulatory Visit (INDEPENDENT_AMBULATORY_CARE_PROVIDER_SITE_OTHER): Payer: Medicare HMO | Admitting: Internal Medicine

## 2018-10-17 ENCOUNTER — Ambulatory Visit (INDEPENDENT_AMBULATORY_CARE_PROVIDER_SITE_OTHER): Payer: Medicare HMO

## 2018-10-17 DIAGNOSIS — R05 Cough: Secondary | ICD-10-CM

## 2018-10-17 DIAGNOSIS — R0609 Other forms of dyspnea: Secondary | ICD-10-CM | POA: Diagnosis not present

## 2018-10-17 DIAGNOSIS — R059 Cough, unspecified: Secondary | ICD-10-CM

## 2018-10-17 DIAGNOSIS — R0600 Dyspnea, unspecified: Secondary | ICD-10-CM

## 2018-10-17 DIAGNOSIS — R0602 Shortness of breath: Secondary | ICD-10-CM | POA: Diagnosis not present

## 2018-10-17 DIAGNOSIS — M542 Cervicalgia: Secondary | ICD-10-CM | POA: Diagnosis not present

## 2018-10-17 DIAGNOSIS — M25511 Pain in right shoulder: Secondary | ICD-10-CM | POA: Diagnosis not present

## 2018-10-17 LAB — BASIC METABOLIC PANEL
BUN: 16 mg/dL (ref 6–23)
CO2: 30 mEq/L (ref 19–32)
Calcium: 10 mg/dL (ref 8.4–10.5)
Chloride: 102 mEq/L (ref 96–112)
Creatinine, Ser: 0.88 mg/dL (ref 0.40–1.20)
GFR: 60.83 mL/min (ref >60.00)
Glucose, Bld: 107 mg/dL — ABNORMAL HIGH (ref 70–99)
Potassium: 3.6 mEq/L (ref 3.5–5.1)
Sodium: 141 mEq/L (ref 135–145)

## 2018-10-17 LAB — PULMONARY FUNCTION TEST
DL/VA % pred: 104 %
DL/VA: 4.23 ml/min/mmHg/L
DLCO unc % pred: 72 %
DLCO unc: 13.07 ml/min/mmHg
FEF 25-75 Post: 1.39 L/sec
FEF 25-75 Pre: 1.04 L/sec
FEF2575-%Change-Post: 34 %
FEF2575-%Pred-Post: 130 %
FEF2575-%Pred-Pre: 97 %
FEV1-%Change-Post: 8 %
FEV1-%Pred-Post: 79 %
FEV1-%Pred-Pre: 73 %
FEV1-Post: 1.34 L
FEV1-Pre: 1.23 L
FEV1FVC-%Change-Post: -1 %
FEV1FVC-%Pred-Pre: 108 %
FEV6-%Change-Post: 9 %
FEV6-%Pred-Post: 80 %
FEV6-%Pred-Pre: 73 %
FEV6-Post: 1.73 L
FEV6-Pre: 1.57 L
FEV6FVC-%Pred-Post: 106 %
FEV6FVC-%Pred-Pre: 106 %
FVC-%Change-Post: 9 %
FVC-%Pred-Post: 75 %
FVC-%Pred-Pre: 68 %
FVC-Post: 1.73 L
FVC-Pre: 1.57 L
Post FEV1/FVC ratio: 77 %
Post FEV6/FVC ratio: 100 %
Pre FEV1/FVC ratio: 78 %
Pre FEV6/FVC Ratio: 100 %
RV % pred: 82 %
RV: 2.06 L
TLC % pred: 76 %
TLC: 3.82 L

## 2018-10-17 LAB — CBC WITH DIFFERENTIAL/PLATELET
Basophils Absolute: 0.1 10*3/uL (ref 0.0–0.1)
Basophils Relative: 0.8 % (ref 0.0–3.0)
Eosinophils Absolute: 0.1 10*3/uL (ref 0.0–0.7)
Eosinophils Relative: 1.5 % (ref 0.0–5.0)
HCT: 36.3 % (ref 36.0–46.0)
Hemoglobin: 12.1 g/dL (ref 12.0–15.0)
Lymphocytes Relative: 19.3 % (ref 12.0–46.0)
Lymphs Abs: 1.8 10*3/uL (ref 0.7–4.0)
MCHC: 33.3 g/dL (ref 30.0–36.0)
MCV: 95.5 fl (ref 78.0–100.0)
Monocytes Absolute: 0.8 10*3/uL (ref 0.1–1.0)
Monocytes Relative: 9.1 % (ref 3.0–12.0)
Neutro Abs: 6.5 10*3/uL (ref 1.4–7.7)
Neutrophils Relative %: 69.3 % (ref 43.0–77.0)
Platelets: 332 10*3/uL (ref 150.0–400.0)
RBC: 3.8 Mil/uL — ABNORMAL LOW (ref 3.87–5.11)
RDW: 13.9 % (ref 11.5–15.5)
WBC: 9.4 10*3/uL (ref 4.0–10.5)

## 2018-10-17 LAB — HEPATIC FUNCTION PANEL
ALT: 8 U/L (ref 0–35)
AST: 18 U/L (ref 0–37)
Albumin: 4.1 g/dL (ref 3.5–5.2)
Alkaline Phosphatase: 84 U/L (ref 39–117)
Bilirubin, Direct: 0.1 mg/dL (ref 0.0–0.3)
Total Bilirubin: 0.4 mg/dL (ref 0.2–1.2)
Total Protein: 6.6 g/dL (ref 6.0–8.3)

## 2018-10-17 LAB — BRAIN NATRIURETIC PEPTIDE: Pro B Natriuretic peptide (BNP): 55 pg/mL (ref 0.0–100.0)

## 2018-10-17 LAB — D-DIMER, QUANTITATIVE: D-Dimer, Quant: 1.09 mcg/mL FEU — ABNORMAL HIGH (ref <0.50)

## 2018-10-17 LAB — TSH: TSH: 2.01 u[IU]/mL (ref 0.35–4.50)

## 2018-10-17 NOTE — Patient Instructions (Addendum)
GERD (REFLUX)  is an extremely common cause of respiratory symptoms just like yours , many times with no obvious heartburn at all.    It can be treated with medication, but also with lifestyle changes including elevation of the head of your bed (ideally with 6 -8inch blocks under the headboard of your bed),  Smoking cessation, avoidance of late meals, excessive alcohol, and avoid fatty foods, chocolate, peppermint, colas, red wine, and acidic juices such as orange juice.  NO MINT OR MENTHOL PRODUCTS SO NO COUGH DROPS  USE SUGARLESS CANDY INSTEAD (Jolley ranchers or Stover's or Life Savers) or even ice chips will also do - the key is to swallow to prevent all throat clearing. NO OIL BASED VITAMINS - use powdered substitutes.  Avoid fish oil when coughing.    Continue Pepcid 20 mg one hour before bedtme  Elevate your legs and minimize salt intake    Please remember to go to the lab and x-ray department   for your tests - we will call you with the results when they are available.      If you are satisfied with your treatment plan,  let your doctor know and he/she can either refill your medications or you can return here when your prescription runs out.     If in any way you are not 100% satisfied,  please tell us.  If 100% better, tell your friends!  Pulmonary follow up is as needed

## 2018-10-17 NOTE — Progress Notes (Signed)
[name], female    DOB: 07-Sep-1929,  .   MRN: [id number]   Brief patient profile:  72 yowf never smoker never breathing problems until fell tgiving 2019  and hit head in bathroom hitting on marble flooring and doe since so referred to pulmonary clinic 06/06/2018 by Dr Larose Kells p neg cardiac w/u by [facility]   Note seen pulmonary clinic  09/25/10 with "cough since 2010" with dx UACS rec gerd rx and zyrtec and improved but never completely resolved.     History of Present Illness  06/06/2018  Pulmonary/ 1st office eval/Deneshia Zucker  Chief Complaint  Patient presents with  . Pulmonary Consult    Referred by Dr. Larose Kells.  Pt c/o DOE with any exertion since she fell Thanksgiving 2019.    Dyspnea:  abrupt onset in nov 2019 p fell - barely now able to do HT p using HC parking = MMRC3 = can't walk 100 yards even at a slow pace at a flat grade s stopping due to sob   Cough: has had some pnds x decades no recent eval by allergy or ent  Sleep: in recliner ever before she fell same angle almost flat no problem with cough /wheeze or sob  SABA use: none Pos sense of globus  Prev w/u 2012 better on gerd rx/ zyrtec rec Try zyrtec 10 mg at bedtime instead of tylenol sinus  Pantoprazole (protonix) 40 mg   Take  30-60 min before first meal of the day and Pepcid (famotidine)  20 mg one after supper  until return to office - this is the best way to tell whether stomach acid is contributing to your problem.   GERD  Diet  Please schedule a follow up office visit in 6 weeks, call sooner if needed with all medications /inhalers/ solutions in hand so we can verify exactly what you are taking. This includes all medications from all doctors and over the counters with pfts on return     06/26/2018  f/u ov/Diaz Crago re: uacs/ globus sensation  Chief Complaint  Patient presents with  . Follow-up    Breathing is unchanged. No new co's.   Dyspnea:  Walks with cane room to room then gives out more due  to orthopedic than pulmonary  limitations  Cough: sense of pnds/ globus no better on zyrtec and never took ppi Sleeping: In recliner chronically  rec Pantoprazole (protonix) 40 mg   Take  30-60 min before first meal of the day and Pepcid (famotidine)  20 mg one @  bedtime until return to office whether you think you need it or not   GERD  Diet   Keep walking as much possible    10/17/2018  f/u ov/Aryssa Rosamond re: uacs better p gerd rx/   off it for several weeks s flare     Chief Complaint  Patient presents with  . Follow-up    Breathing is some better.   Dyspnea:  Walks with cane sob x 50 ft  Cough: gone  Sleeping: almost flat in recliner s resp symptoms  SABA use: none  02: none   No obvious day to day or daytime variability or assoc excess/ purulent sputum or mucus plugs or hemoptysis or cp or chest tightness, subjective wheeze or overt sinus or hb symptoms.   sleeping without nocturnal  or early am exacerbation  of respiratory  c/o's or need for noct saba. Also denies any obvious fluctuation of symptoms with weather or environmental changes or other aggravating  or alleviating factors except as outlined above   No unusual exposure hx or h/o childhood pna/ asthma or knowledge of premature birth.  Current Allergies, Complete Past Medical History, Past Surgical History, Family History, and Social History were reviewed in [employer] record.  ROS  The following are not active complaints unless bolded Hoarseness, sore throat, dysphagia, dental problems, itching, sneezing,  nasal congestion or discharge of excess mucus or purulent secretions, ear ache,   fever, chills, sweats, unintended wt loss or wt gain, classically pleuritic or exertional cp,  orthopnea pnd or arm/hand swelling  or leg swelling, presyncope, palpitations, abdominal pain, anorexia, nausea, vomiting, diarrhea  or change in bowel habits or change in bladder habits, change in stools or change in urine, dysuria, hematuria,  rash,  arthralgias, visual complaints, headache, numbness, weakness or ataxia or problems with walking or coordination,  change in mood or  memory.        Current Meds  Medication Sig  . acetaminophen (TYLENOL) 325 MG tablet Take 650 mg by mouth every 6 (six) hours as needed.  Marland Kitchen atorvastatin (LIPITOR) 40 MG tablet Take 1 tablet (40 mg total) by mouth at bedtime.  Marland Kitchen azelastine (OPTIVAR) 0.05 % ophthalmic solution Apply 1 drop to eye 2 (two) times a day.  . Ca [id number] (CALCIUM-FOLIC ACID PLUS D PO) Take by mouth daily.  . celecoxib (CELEBREX) 200 MG capsule Take 1 capsule (200 mg total) by mouth daily as needed for moderate pain.  . cephALEXin (KEFLEX) 500 MG capsule Take 1 capsule (500 mg total) by mouth 4 (four) times daily.  Marland Kitchen CRANBERRY PO Take 1 capsule by mouth daily.  . cyclobenzaprine (FLEXERIL) 10 MG tablet Take 10 mg by mouth 3 (three) times daily as needed for muscle spasms.  Marland Kitchen dextromethorphan-guaiFENesin (MUCINEX DM) 30-600 MG 12hr tablet Take 1 tablet by mouth 2 (two) times daily as needed for cough.  . famotidine-calcium carbonate-magnesium hydroxide (PEPCID COMPLETE) 10-800-165 MG chewable tablet Chew 1 tablet by mouth daily as needed.  . gabapentin (NEURONTIN) 100 MG capsule Take 2-3 capsules (200-300 mg total) by mouth 3 (three) times daily.  . multivitamin (THERAGRAN) per tablet Take 1 tablet by mouth daily.    . NON FORMULARY Take 2 capsules by mouth 2 (two) times daily. Hydro Eye  . olopatadine (PATANOL) 0.1 % ophthalmic solution As directed  . pantoprazole (PROTONIX) 40 MG tablet Take 1 tablet (40 mg total) by mouth daily. Take 30-60 min before first meal of the day  . Potassium Gluconate 550 MG TABS Take by mouth.  . Probiotic Product (PROBIOTIC DAILY PO) Take 1 capsule by mouth daily.   . propranolol ER (INDERAL LA) 60 MG 24 hr capsule Take 1 capsule (60 mg total) by mouth daily.  . SUMAtriptan (IMITREX) 25 MG tablet Take 25 mg by mouth every 2 (two) hours as  needed for headache.           Objective:    amb pleasant wf nad   10/17/2018        164   06/26/18 163 lb (73.9 kg)  06/07/18 164 lb (74.4 kg)  06/06/18 164 lb (74.4 kg)     Vital signs reviewed - Note on arrival 02 sats  96% on RA    HEENT: nl dentition, turbinates bilaterally, and oropharynx. Nl external ear canals without cough reflex   NECK :  without JVD/Nodes/TM/ nl carotid upstrokes bilaterally   LUNGS: no acc muscle use,  Nl contour chest which is  clear to A and P bilaterally without cough on insp or exp maneuvers   CV:  RRR  no s3 or murmur or increase in P2, and 1+ pitting bilateral le edema   ABD:  soft and nontender with nl inspiratory excursion in the supine position. No bruits or organomegaly appreciated, bowel sounds nl  MS:  Very slow gait, walks with cane-  ext warm without deformities, calf tenderness, cyanosis or clubbing    SKIN: warm and dry without lesions    NEURO:  alert, approp, nl sensorium with  no motor or cerebellar deficits apparent.           CXR PA and Lateral:   10/17/2018 :    I personally reviewed images and agree with radiology impression as follows:    No active cardiopulmonary disease.    Labs ordered/ reviewed:      Chemistry      Component Value Date/Time   NA 141 10/17/2018 1414   NA 139 05/12/2018 1225   K 3.6 10/17/2018 1414   CL 102 10/17/2018 1414   CO2 30 10/17/2018 1414   BUN 16 10/17/2018 1414   BUN 21 05/12/2018 1225   CREATININE 0.88 10/17/2018 1414      Component Value Date/Time   CALCIUM 10.0 10/17/2018 1414   ALKPHOS 84 10/17/2018 1414   AST 18 10/17/2018 1414   ALT 8 10/17/2018 1414   BILITOT 0.4 10/17/2018 1414        Lab Results  Component Value Date   WBC 9.4 10/17/2018   HGB 12.1 10/17/2018   HCT 36.3 10/17/2018   MCV 95.5 10/17/2018   PLT 332.0 10/17/2018       EOS                                                               0.1                                    10/17/2018    Lab Results  Component Value Date   DDIMER 1.09 (H) 10/17/2018      Lab Results  Component Value Date   TSH 2.01 10/17/2018     Lab Results  Component Value Date   PROBNP 55.0 10/17/2018                 Assessment

## 2018-10-17 NOTE — Progress Notes (Signed)
PFT done today. 

## 2018-10-18 ENCOUNTER — Encounter: Payer: Self-pay | Admitting: Internal Medicine

## 2018-10-18 NOTE — Assessment & Plan Note (Addendum)
Onset 02/2018 p fell in bathroom - Echo 05/02/2018 Left ventricle: The cavity size was normal. Wall thickness was   normal. Systolic function was normal. The estimated ejection   fraction was in the range of 55% to 60%. Wall motion was normal;   there were no regional wall motion abnormalities. Doppler   parameters are consistent with abnormal left ventricular   relaxation (grade 1 diastolic dysfunction). - Mitral valve: Mildly calcified annulus. There was mild   regurgitation./ LA size nl  - 06/06/2018   Walked RA x one lap =  approx 250 ft - stopped due to  Sob at faster than avg pace, no desats PFT's  10/17/2018  FEV1 1.34 (79 % ) ratio 0.77  p 8 % improvement from saba p nothing prior to study with DLCO  72 % corrects to 104 % for alv volume    Symptoms are   disproportionate to objective findings and not clear to what extent this is actually a pulmonary  problem but pt does appear to have difficult to sort out respiratory symptoms of unknown origin for which  DDX  = almost all start with A and  include Adherence, Ace Inhibitors, Acid Reflux, Active Sinus Disease, Alpha 1 Antitripsin deficiency, Anxiety masquerading as Airways dz,  ABPA,  Allergy(esp in young), Aspiration (esp in elderly), Adverse effects of meds,  Active smoking or Vaping, A bunch of PE's/clot burden (a few small clots can't cause this syndrome unless there is already severe underlying pulm or vascular dz with poor reserve),  Anemia or thyroid disorder, plus two Bs  = Bronchiectasis and Beta blocker use..and one C= CHF   Adherence is always the initial "prime suspect" and is a multilayered concern that requires a "trust but verify" approach in every patient - starting with knowing how to use medications, especially inhalers, correctly, keeping up with refills and understanding the fundamental difference between maintenance and prns vs those medications only taken for a very short course and then stopped and not refilled.  - rec  always return with all meds in hand using a trust but verify approach to confirm accurate Medication  Reconciliation The principal here is that until we are certain that the  patients are doing what we've asked, it makes no sense to ask them to do more.   ? Acid (or non-acid) GERD > always difficult to exclude as up to 75% of pts in some series report no assoc GI/ Heartburn symptoms>  Symptoms no different on or off gerd rx  ? Anxiety /depression/ deconditioning > usually at the bottom of this list of usual suspects but should be   higher on this pt's based on H and P and   may interfere with adherence and also interpretation of response or lack thereof to symptom management which can be quite subjective.  ? Anemia/ thyroid dz excluded   ? A bunch of PE's:   No evidence of PH on echo and  D dimer  High nl  For age/ chronic illness  high normal value (seen commonly in the elderly or chronically ill)  may miss small peripheral pe, the clot burden with sob is moderately high and the d dimer  has a very high neg pred value if used in this setting.    ? CHF > she does appear a bit vol overloaded today but bnp nl so rec elevate legs and f/u with pcp

## 2018-10-18 NOTE — Progress Notes (Signed)
Spoke with pt and notified of results per Dr. Wert. Pt verbalized understanding and denied any questions. 

## 2018-10-18 NOTE — Assessment & Plan Note (Addendum)
Onset 2010  - Improved 2012  on Zyrtec typical of upper airway cough syndrome   - worse off zyrtec / gerd rx 06/06/2018 so rec rechallenge with zyrtec/ ppi/h1hs and f/u in 6 weeks  - Allergy profile 06/23/18  >  Eos 0.2 /  IgE  4 RAST neg   Resolved to her satisfaction, now just on pepcid 20 mg p supper   F/u can be prn

## 2018-10-23 ENCOUNTER — Other Ambulatory Visit: Payer: Self-pay

## 2018-10-24 ENCOUNTER — Ambulatory Visit (INDEPENDENT_AMBULATORY_CARE_PROVIDER_SITE_OTHER): Payer: Medicare HMO | Admitting: Internal Medicine

## 2018-10-24 ENCOUNTER — Encounter: Payer: Self-pay | Admitting: Internal Medicine

## 2018-10-24 ENCOUNTER — Ambulatory Visit (HOSPITAL_BASED_OUTPATIENT_CLINIC_OR_DEPARTMENT_OTHER)
Admission: RE | Admit: 2018-10-24 | Discharge: 2018-10-24 | Disposition: A | Discharge status: Home / Self Care | Payer: Medicare HMO | Source: Ambulatory Visit | Attending: Internal Medicine | Admitting: Internal Medicine

## 2018-10-24 VITALS — BP 169/86 | HR 88 | Temp 97.7°F | Resp 16 | Ht 63.5 in | Wt 162.2 lb

## 2018-10-24 DIAGNOSIS — M25572 Pain in left ankle and joints of left foot: Secondary | ICD-10-CM

## 2018-10-24 DIAGNOSIS — R443 Hallucinations, unspecified: Secondary | ICD-10-CM

## 2018-10-24 DIAGNOSIS — M109 Gout, unspecified: Secondary | ICD-10-CM | POA: Diagnosis not present

## 2018-10-24 DIAGNOSIS — M79672 Pain in left foot: Secondary | ICD-10-CM | POA: Diagnosis not present

## 2018-10-24 DIAGNOSIS — F329 Major depressive disorder, single episode, unspecified: Secondary | ICD-10-CM | POA: Diagnosis not present

## 2018-10-24 DIAGNOSIS — I10 Essential (primary) hypertension: Secondary | ICD-10-CM

## 2018-10-24 DIAGNOSIS — M7989 Other specified soft tissue disorders: Secondary | ICD-10-CM | POA: Diagnosis not present

## 2018-10-24 DIAGNOSIS — F32A Depression, unspecified: Secondary | ICD-10-CM

## 2018-10-24 MED ORDER — ESCITALOPRAM OXALATE 5 MG PO TABS: 5.0000 mg | ORAL_TABLET | Freq: Every day | ORAL | 0 refills | Status: DC

## 2018-10-24 MED ORDER — FUROSEMIDE 20 MG PO TABS: 20.0000 mg | ORAL_TABLET | Freq: Every day | ORAL | 0 refills | Status: DC

## 2018-10-24 MED ORDER — DOXYCYCLINE HYCLATE 100 MG PO TABS: 100.0000 mg | ORAL_TABLET | Freq: Two times a day (BID) | ORAL | 0 refills | Status: DC

## 2018-10-24 NOTE — Patient Instructions (Signed)
GO TO THE LAB : Get the blood work     GO TO THE FRONT DESK Schedule your next appointment to see me in 2 weeks   STOP BY THE FIRST FLOOR:  get the XR   Stop Keflex Start doxycycline, a new antibiotic  Start escitalopram 5 mg 1 tablet daily to help with depression Start Lasix 20 mg 1 tablet daily to help with swelling, this is a diuretic. Please keep your legs elevated to prevent swelling  We are referring you to the orthopedic doctor

## 2018-10-24 NOTE — Progress Notes (Signed)
Subjective:    Patient ID: [name], female    DOB: [birth year], 85 y.o.   MRN: [id number]  DOS:  10/24/2018 Type of visit - description: acute, multiple concerns Hallucinations: Reports that at night, when she goes to the bathroom she sees people, sitting in her living room, is not frightful but she realizes the next morning that nobody was there. Lower extremity edema, pain at the lower extremities: See last visit, getting worse. On further chart review, she used to see podiatry and got local injections at the left foot few weeks ago. Depression: Lost a brother not long ago, she is also depressed because she feels that she is losing ground and will not be able to leave independently. History of falls, doing physical therapy which helps.    BP Readings from Last 3 Encounters:  10/24/18 (!) 169/86  10/17/18 124/70  10/12/18 (!) 156/78   Wt Readings from Last 3 Encounters:  10/24/18 162 lb 4 oz (73.6 kg)  10/17/18 164 lb (74.4 kg)  10/12/18 162 lb 6 oz (73.7 kg)     Review of Systems Denies fever chills. Headaches are very rare.   Past Medical History:  Diagnosis Date  . Anxiety and depression   . Back pain    lumbar  . Fecal incontinence    Dr. Michail Sermon, likely due to loose stools in conjunction with pelvic muscle laxity from aging, recommended to stop Miralax  . GI (gastrointestinal bleed)    hx of colon polyps 2004, diverticulosis, internal hemmorhids  . Headache   . Headache(784.0)   . Hydronephrosis    Right 2008 s/p urology eval CT/cysto: observation/monitor  . Hyperlipidemia   . Hypertension   . Knee pain    right chronic  . Migraine    on topamax , per neuro  . Osteoporosis   . Pulmonary nodules    [place]. First seen by CT only 05/04/03, no change on f/u 08/08/06 -Macroscopic changes rml 04/27/07 > resolved april 6,2010 no further w/u   . Seborrheic keratosis    Left inferior posterior flank  . SVT (supraventricular tachycardia) (HCC)    sees Dr.Ross     Past Surgical History:  Procedure Laterality Date  . ABDOMINAL HYSTERECTOMY    . APPENDECTOMY    . BASAL CELL CARCINOMA EXCISION    . BLADDER REPAIR    . CATARACT EXTRACTION  2008   Dr.Digby  . CHOLECYSTECTOMY    . lumbar reconstructive surgery  01/07/2009  . R ovary removed    . ROTATOR CUFF REPAIR  9/-2011  . SHOULDER SURGERY  12-2011   LEFT  . TONSILLECTOMY    . TOTAL KNEE ARTHROPLASTY     right  . trigger finger surgery     L thumb (2014), R ring finger 05-2013    Social History   Socioeconomic History  . Marital status: Widowed    Spouse name: Not on file  . Number of children: 1  . Years of education: college   . Highest education level: Doctorate  Occupational History  . Occupation: retired   Scientific laboratory technician  . Financial resource strain: Not on file  . Food insecurity    Worry: Not on file    Inability: Not on file  . Transportation needs    Medical: Not on file    Non-medical: Not on file  Tobacco Use  . Smoking status: Never Smoker  . Smokeless tobacco: Never Used  Substance and Sexual Activity  . Alcohol use: Yes  Alcohol/week: 1.0 standard drinks    Types: 1 Glasses of wine per week    Comment: rarely, 1 glass of wine monthly  . Drug use: No  . Sexual activity: Not Currently  Lifestyle  . Physical activity    Days per week: Not on file    Minutes per session: Not on file  . Stress: Not on file  Relationships  . Social Herbalist on phone: Not on file    Gets together: Not on file    Attends religious service: Not on file    Active member of club or organization: Not on file    Attends meetings of clubs or organizations: Not on file    Relationship status: Not on file  . Intimate partner violence    Fear of current or ex partner: Not on file    Emotionally abused: Not on file    Physically abused: Not on file    Forced sexual activity: Not on file  Other Topics Concern  . Not on file  Social History Narrative   Moved from a  retirement home to a condominium 04-2013, independent living,  lives by herself , drives    Son lives in [place]   Right handed    1 cup of caffeine per day       Allergies as of 10/24/2018      Reactions   Amitriptyline Hcl    REACTION: hallucinations   Carisoprodol    REACTION: nightmares,rash,tachycardia   Codeine    REACTION: "deathly ill" nauesa, dizzy,weak   Fosamax [alendronate Sodium] Other (See Comments)   Dental issues   Meloxicam    REACTION: sensitive to sun; facial redness; may cause severe abd pain   Metronidazole    REACTION: abd pain   Nortriptyline Hcl    REACTION: hallucinations   Oxaprozin    REACTION: abd pain   Promethazine Hcl    REACTION: spastic arms and legs flailing while awake and unusual behavior while sleep - walking all over house during night   Rofecoxib    REACTION: rash   Sumatriptan    REACTION: abdominal pain, blurry vision, drowsiness   Tramadol Other (See Comments)   Dizziness and constipation   Venlafaxine    Hallucinations      Medication List       Accurate as of October 24, 2018 11:24 AM. If you have any questions, ask your nurse or doctor.        acetaminophen 325 MG tablet Commonly known as: TYLENOL Take 650 mg by mouth every 6 (six) hours as needed.   atorvastatin 40 MG tablet Commonly known as: LIPITOR Take 1 tablet (40 mg total) by mouth at bedtime.   azelastine 0.05 % ophthalmic solution Commonly known as: OPTIVAR Apply 1 drop to eye 2 (two) times a day.   CALCIUM-FOLIC ACID PLUS D PO Take by mouth daily.   celecoxib 200 MG capsule Commonly known as: CELEBREX Take 1 capsule (200 mg total) by mouth daily as needed for moderate pain.   cephALEXin 500 MG capsule Commonly known as: KEFLEX Take 1 capsule (500 mg total) by mouth 4 (four) times daily.   CRANBERRY PO Take 1 capsule by mouth daily.   cyclobenzaprine 10 MG tablet Commonly known as: FLEXERIL Take 10 mg by mouth 3 (three) times daily as needed for  muscle spasms.   dextromethorphan-guaiFENesin 30-600 MG 12hr tablet Commonly known as: MUCINEX DM Take 1 tablet by mouth 2 (two) times daily  as needed for cough.   famotidine-calcium carbonate-magnesium hydroxide 10-800-165 MG chewable tablet Commonly known as: PEPCID COMPLETE Chew 1 tablet by mouth daily as needed.   gabapentin 100 MG capsule Commonly known as: NEURONTIN Take 2-3 capsules (200-300 mg total) by mouth 3 (three) times daily.   loperamide 2 MG tablet Commonly known as: IMODIUM A-D Take 2 mg by mouth 4 (four) times daily as needed for diarrhea or loose stools.   multivitamin per tablet Take 1 tablet by mouth daily.   NON FORMULARY Take 2 capsules by mouth 2 (two) times daily. Hydro Eye   olopatadine 0.1 % ophthalmic solution Commonly known as: PATANOL As directed   PEPTO-BISMOL PO Take by mouth.   Potassium Gluconate 550 MG Tabs Take by mouth.   Potassium Gluconate 550 MG Tabs Take 1 tablet by mouth daily.   PROBIOTIC DAILY PO Take 1 capsule by mouth daily.   Sinus Pressure + Pain 5-325 MG Tabs Generic drug: Phenylephrine-Acetaminophen Take by mouth as directed.           Objective:   Physical Exam BP (!) 169/86 (BP Location: Left Arm, Patient Position: Sitting, Cuff Size: Small)   Pulse 88   Temp 97.7 F (36.5 C) (Oral)   Resp 16   Ht 5' 3.5" (1.613 m)   Wt 162 lb 4 oz (73.6 kg)   SpO2 95%   BMI 28.29 kg/m  General:   Well developed, NAD, BMI noted. HEENT:  Normocephalic . Face symmetric, atraumatic Lungs:  CTA B Normal respiratory effort, no intercostal retractions, no accessory muscle use. Heart: RRR,  no murmur.  Lower extremities: Pitting edema is indeed slightly worse compared to the last visit on 10/12/2018. ( still L>R)     + Pedal pulses bilaterally She is TTP from the knee down worse on the left Mild redness and warmness mostly from the ankle down on the left and some at the right foot. No open sores Neurologic:  alert  & oriented X3.   Psych--  Cognition and judgment appear intact.  Cooperative with normal attention span and concentration.  + Depressed appearing, tearful.  Very apprehensive which is baseline for her.         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 SVT, saw Dr. Harrington Challenger 2014, on cardiazem NEURO: Migraines: on gabapentin, flexeril prn sees neurology ;d/c topamax ~ 08/17/17 (s/e)(Dr Domingo Cocking)   Chronic Dizzies : 2012/2013: w/u (-) including echo, MRA head-neck, Brain MRI, CT head.  06/2017 brain MRI wnl for age  Mild cognitive impairment: Per neurology note 10/2017. GI: Fecal incontinence, GI Dr. Michail Sermon H/o SBO 08-2015 Pulmonary nodules First seen by CT only 05/04/03, no change on f/u 08/08/06 -Macroscopic changes rml 04/27/07 > resolved april 6,2010 no further w/u  Nodules noted again: CT 08/2015  CT 04-2016: Stable, recheck in one year if high risk only. [facility]  Sees Dermatology  GU: Sees urology regularly, on daily antibiotics  PLAN: Multiple issues today Lower extremity  swelling,  slightly worse on the left, associated with warmness and redness: Since the last visit 10/12/2018 B ultrasound was negative for DVT. On chart review, echocardiogram 04-2018 show a normal EF. Seen by pulmonary few days ago, BMP, BNP, CBC were normal. Etiology not completely clear: Cellulitis?  Gout?  Occult fracture? Plan:  X-ray, left foot and ankle, stop Keflex, start doxycycline, refer to  orthopedic surgery. Start Lasix daily, encouraged leg elevation. HTN:  BP slightly elevated, currently on no BP meds, starting Lasix today.   Hallucinations, as described above, unclear if he is hallucinations or simply dreams.  Will check a UA, urine culture.  She takes gabapentin regularly for migraines. Depression: As described above, start Lexapro, low-dose, watch for impact of Lexapro on hallucinations. Time spent more than 40 minutes RTC 2 weeksg  Today, I spent more than   40 min with the patient: >50% of the time counseling regards depression, hallucinations, doing extensive review of charts, assessing lower extremity edema-  a problem that is not responding well to previous treatments. I also provided listening therapy for her depression.

## 2018-10-24 NOTE — Progress Notes (Signed)
Pre visit review using our clinic review tool, if applicable. No additional management support is needed unless otherwise documented below in the visit note. 

## 2018-10-25 ENCOUNTER — Other Ambulatory Visit (HOSPITAL_COMMUNITY)
Admission: RE | Admit: 2018-10-25 | Discharge: 2018-10-25 | Disposition: A | Discharge status: Home / Self Care | Payer: Medicare HMO | Source: Other Acute Inpatient Hospital | Attending: Internal Medicine | Admitting: Internal Medicine

## 2018-10-25 DIAGNOSIS — M109 Gout, unspecified: Secondary | ICD-10-CM | POA: Diagnosis not present

## 2018-10-25 DIAGNOSIS — R443 Hallucinations, unspecified: Secondary | ICD-10-CM | POA: Insufficient documentation

## 2018-10-25 DIAGNOSIS — M25572 Pain in left ankle and joints of left foot: Secondary | ICD-10-CM | POA: Diagnosis present

## 2018-10-25 LAB — URINALYSIS, ROUTINE W REFLEX MICROSCOPIC
Bilirubin Urine: NEGATIVE
Glucose, UA: NEGATIVE mg/dL
Hgb urine dipstick: NEGATIVE
Ketones, ur: NEGATIVE mg/dL
Leukocytes,Ua: NEGATIVE
Nitrite: NEGATIVE
Protein, ur: NEGATIVE mg/dL
Specific Gravity, Urine: 1.016 (ref 1.005–1.030)
pH: 6 (ref 5.0–8.0)

## 2018-10-25 LAB — URIC ACID
Uric Acid, Serum: 6.2 mg/dL (ref 2.4–7.0)
Uric Acid, Serum: 6.2 mg/dL (ref 2.5–7.1)

## 2018-10-26 LAB — URINE CULTURE: Culture: NO GROWTH

## 2018-10-26 NOTE — Assessment & Plan Note (Signed)
Multiple issues today Lower extremity  swelling, slightly worse on the left, associated with warmness and redness: Since the last visit 10/12/2018 B ultrasound was negative for DVT. On chart review, echocardiogram 04-2018 show a normal EF. Seen by pulmonary few days ago, BMP, BNP, CBC were normal. Etiology not completely clear: Cellulitis?  Gout?  Occult fracture? Plan:  X-ray, left foot and ankle, stop Keflex, start doxycycline, refer to orthopedic surgery. Start Lasix daily, encouraged leg elevation. HTN:  BP slightly elevated, currently on no BP meds, starting Lasix today.   Hallucinations, as described above, unclear if he is hallucinations or simply dreams.  Will check a UA, urine culture.  She takes gabapentin regularly for migraines. Depression: As described above, start Lexapro, low-dose, watch for impact of Lexapro on hallucinations. Time spent more than 40 minutes RTC 2 weeksg

## 2018-10-28 ENCOUNTER — Other Ambulatory Visit: Payer: Self-pay | Admitting: Internal Medicine

## 2018-10-30 ENCOUNTER — Telehealth: Payer: Self-pay

## 2018-10-30 DIAGNOSIS — Z7689 Persons encountering health services in other specified circumstances: Secondary | ICD-10-CM | POA: Diagnosis not present

## 2018-10-30 DIAGNOSIS — I890 Lymphedema, not elsewhere classified: Secondary | ICD-10-CM | POA: Diagnosis not present

## 2018-10-30 NOTE — Telephone Encounter (Signed)
Copied from [place] [id number]. Topic: Quick Communication - See Telephone Encounter >> Oct 30, 2018 11:55 AM Loma Boston wrote: CRM for notification. See Telephone encounter for: 10/30/18.Pt was seen by  referred by Larose Kells to [place]  at [facility] and she was not happy. He did nothing for her and is referring her to a vein Specialist. He mentioned compression hose and that was why she is in so much trouble she says.  She is also wanting to know if she can get a script for the pain in her right ankle also,Dr. Doran Durand told her she could not take aspirin. She would like Dr Larose Kells to know what events had conspired and know about she could not take aspirin for her pain. [phone] If maybe Dr Ethel Rana nurse could call her.

## 2018-10-31 NOTE — Telephone Encounter (Signed)
LMOM informing Pt to return call. Okay for PEC to discuss.  

## 2018-10-31 NOTE — Telephone Encounter (Signed)
Okay, I think the best thing is to proceed with my suggestions of taking doxycycline, Lasix and follow-up in few days at this office. We will reassess the situation then. For pain, she is already taking Celebrex, does not need aspirin. She can take Tylenol 500 mg 2 tablets 3 times daily.

## 2018-11-01 ENCOUNTER — Ambulatory Visit: Payer: BC Managed Care – PPO | Admitting: Internal Medicine

## 2018-11-01 ENCOUNTER — Other Ambulatory Visit: Payer: Self-pay | Admitting: Internal Medicine

## 2018-11-02 ENCOUNTER — Other Ambulatory Visit: Payer: Self-pay | Admitting: Internal Medicine

## 2018-11-02 ENCOUNTER — Telehealth: Payer: Self-pay | Admitting: Internal Medicine

## 2018-11-02 ENCOUNTER — Other Ambulatory Visit: Payer: Self-pay | Admitting: Neurology

## 2018-11-02 NOTE — Telephone Encounter (Signed)
Please advise 

## 2018-11-02 NOTE — Telephone Encounter (Signed)
Okay with me 

## 2018-11-02 NOTE — Telephone Encounter (Signed)
Patient is calling to see if Dr. Larose Kells would release her as a patient from the [facility] office and she would like to establish care with Dr. Ethelene Hal at the [place] office because it is closer to her.  The patient will keep her 11/07/2018 appt with Dr. Larose Kells.   Please advise [phone]

## 2018-11-03 ENCOUNTER — Other Ambulatory Visit: Payer: Self-pay | Admitting: Neurology

## 2018-11-03 DIAGNOSIS — R269 Unspecified abnormalities of gait and mobility: Secondary | ICD-10-CM | POA: Insufficient documentation

## 2018-11-06 ENCOUNTER — Other Ambulatory Visit: Payer: Self-pay | Admitting: Internal Medicine

## 2018-11-06 NOTE — Telephone Encounter (Signed)
Patient is calling she see if she can get an TOC Appt with Dr. Jerilee Hoh at [place]. The patient has concerns about Driving too far to [place].  Patient expressed understanding about keeping her appt on 11/07/2018 with Dr. Larose Kells.

## 2018-11-06 NOTE — Telephone Encounter (Signed)
Okay with me. Should go ahead and keep her 7/21 appointment with Dr. Larose Kells.

## 2018-11-07 ENCOUNTER — Ambulatory Visit (INDEPENDENT_AMBULATORY_CARE_PROVIDER_SITE_OTHER): Payer: Medicare HMO | Admitting: Internal Medicine

## 2018-11-07 ENCOUNTER — Encounter: Payer: Self-pay | Admitting: Internal Medicine

## 2018-11-07 ENCOUNTER — Other Ambulatory Visit: Payer: Self-pay

## 2018-11-07 VITALS — BP 118/63 | HR 84 | Temp 98.6°F | Resp 16 | Ht 64.0 in | Wt 158.5 lb

## 2018-11-07 DIAGNOSIS — R600 Localized edema: Secondary | ICD-10-CM

## 2018-11-07 DIAGNOSIS — R443 Hallucinations, unspecified: Secondary | ICD-10-CM | POA: Diagnosis not present

## 2018-11-07 DIAGNOSIS — I10 Essential (primary) hypertension: Secondary | ICD-10-CM

## 2018-11-07 LAB — BASIC METABOLIC PANEL
BUN: 12 mg/dL (ref 6–23)
CO2: 29 mEq/L (ref 19–32)
Calcium: 9.4 mg/dL (ref 8.4–10.5)
Chloride: 103 mEq/L (ref 96–112)
Creatinine, Ser: 0.9 mg/dL (ref 0.40–1.20)
GFR: 59.26 mL/min — ABNORMAL LOW (ref >60.00)
Glucose, Bld: 114 mg/dL — ABNORMAL HIGH (ref 70–99)
Potassium: 3.2 mEq/L — ABNORMAL LOW (ref 3.5–5.1)
Sodium: 143 mEq/L (ref 135–145)

## 2018-11-07 MED ORDER — POTASSIUM CHLORIDE ER 10 MEQ PO TBCR: 10.0000 meq | EXTENDED_RELEASE_TABLET | Freq: Every day | ORAL | 0 refills | Status: DC

## 2018-11-07 MED ORDER — ESCITALOPRAM OXALATE 10 MG PO TABS: 10.0000 mg | ORAL_TABLET | Freq: Every day | ORAL | 1 refills | Status: DC

## 2018-11-07 NOTE — Progress Notes (Signed)
Pre visit review using our clinic review tool, if applicable. No additional management support is needed unless otherwise documented below in the visit note. 

## 2018-11-07 NOTE — Progress Notes (Signed)
Subjective:    Patient ID: [name], female    DOB: 11-10-1929, 85 y.o.   MRN: [id number]  DOS:  11/07/2018 Type of visit - description: Follow-up Edema: Since the last office visit, work-up was done, she saw orthopedic surgery.   Edema is about the same and associated with lower extremity pain. Depression: Started Lexapro 5 mg, thinks is helping. Hallucinations: They stopped.    Wt Readings from Last 3 Encounters:  11/07/18 158 lb 8 oz (71.9 kg)  10/24/18 162 lb 4 oz (73.6 kg)  10/17/18 164 lb (74.4 kg)    Review of Systems No fever chills No weight loss or night sweats No nausea, vomiting, abdominal pain.   Past Medical History:  Diagnosis Date  . Anxiety and depression   . Back pain    lumbar  . Fecal incontinence    Dr. Michail Sermon, likely due to loose stools in conjunction with pelvic muscle laxity from aging, recommended to stop Miralax  . GI (gastrointestinal bleed)    hx of colon polyps 2004, diverticulosis, internal hemmorhids  . Headache   . Headache(784.0)   . Hydronephrosis    Right 2008 s/p urology eval CT/cysto: observation/monitor  . Hyperlipidemia   . Hypertension   . Knee pain    right chronic  . Migraine    on topamax , per neuro  . Osteoporosis   . Pulmonary nodules    [place]. First seen by CT only 05/04/03, no change on f/u 08/08/06 -Macroscopic changes rml 04/27/07 > resolved april 6,2010 no further w/u   . Seborrheic keratosis    Left inferior posterior flank  . SVT (supraventricular tachycardia) (HCC)    sees Dr.Ross    Past Surgical History:  Procedure Laterality Date  . ABDOMINAL HYSTERECTOMY    . APPENDECTOMY    . BASAL CELL CARCINOMA EXCISION    . BLADDER REPAIR    . CATARACT EXTRACTION  2008   Dr.Digby  . CHOLECYSTECTOMY    . lumbar reconstructive surgery  01/07/2009  . R ovary removed    . ROTATOR CUFF REPAIR  9/-2011  . SHOULDER SURGERY  12-2011   LEFT  . TONSILLECTOMY    . TOTAL KNEE ARTHROPLASTY     right  . trigger  finger surgery     L thumb (2014), R ring finger 05-2013    Social History   Socioeconomic History  . Marital status: Widowed    Spouse name: Not on file  . Number of children: 1  . Years of education: college   . Highest education level: Doctorate  Occupational History  . Occupation: retired   Scientific laboratory technician  . Financial resource strain: Not on file  . Food insecurity    Worry: Not on file    Inability: Not on file  . Transportation needs    Medical: Not on file    Non-medical: Not on file  Tobacco Use  . Smoking status: Never Smoker  . Smokeless tobacco: Never Used  Substance and Sexual Activity  . Alcohol use: Yes    Alcohol/week: 1.0 standard drinks    Types: 1 Glasses of wine per week    Comment: rarely, 1 glass of wine monthly  . Drug use: No  . Sexual activity: Not Currently  Lifestyle  . Physical activity    Days per week: Not on file    Minutes per session: Not on file  . Stress: Not on file  Relationships  . Social connections    Talks  on phone: Not on file    Gets together: Not on file    Attends religious service: Not on file    Active member of club or organization: Not on file    Attends meetings of clubs or organizations: Not on file    Relationship status: Not on file  . Intimate partner violence    Fear of current or ex partner: Not on file    Emotionally abused: Not on file    Physically abused: Not on file    Forced sexual activity: Not on file  Other Topics Concern  . Not on file  Social History Narrative   Moved from a retirement home to a condominium 04-2013, independent living,  lives by herself , drives    Son lives in [place]   Right handed    1 cup of caffeine per day       Allergies as of 11/07/2018      Reactions   Amitriptyline Hcl    REACTION: hallucinations   Carisoprodol    REACTION: nightmares,rash,tachycardia   Codeine    REACTION: "deathly ill" nauesa, dizzy,weak   Fosamax [alendronate Sodium] Other (See Comments)    Dental issues   Meloxicam    REACTION: sensitive to sun; facial redness; may cause severe abd pain   Metronidazole    REACTION: abd pain   Nortriptyline Hcl    REACTION: hallucinations   Oxaprozin    REACTION: abd pain   Promethazine Hcl    REACTION: spastic arms and legs flailing while awake and unusual behavior while sleep - walking all over house during night   Rofecoxib    REACTION: rash   Sumatriptan    REACTION: abdominal pain, blurry vision, drowsiness   Tramadol Other (See Comments)   Dizziness and constipation   Venlafaxine    Hallucinations      Medication List       Accurate as of November 07, 2018 11:59 PM. If you have any questions, ask your nurse or doctor.        acetaminophen 325 MG tablet Commonly known as: TYLENOL Take 650 mg by mouth every 6 (six) hours as needed.   atorvastatin 40 MG tablet Commonly known as: LIPITOR Take 1 tablet (40 mg total) by mouth at bedtime.   azelastine 0.05 % ophthalmic solution Commonly known as: OPTIVAR Apply 1 drop to eye 2 (two) times a day.   CALCIUM-FOLIC ACID PLUS D PO Take by mouth daily.   celecoxib 200 MG capsule Commonly known as: CELEBREX Take 1 capsule (200 mg total) by mouth daily as needed for moderate pain.   CRANBERRY PO Take 1 capsule by mouth daily.   cyclobenzaprine 10 MG tablet Commonly known as: FLEXERIL Take 10 mg by mouth 3 (three) times daily as needed for muscle spasms.   dextromethorphan-guaiFENesin 30-600 MG 12hr tablet Commonly known as: MUCINEX DM Take 1 tablet by mouth 2 (two) times daily as needed for cough.   doxycycline 100 MG tablet Commonly known as: VIBRA-TABS Take 1 tablet (100 mg total) by mouth 2 (two) times daily.   escitalopram 10 MG tablet Commonly known as: Lexapro Take 1 tablet (10 mg total) by mouth daily. What changed:   medication strength  how much to take Changed by: Kathlene November, MD   famotidine-calcium carbonate-magnesium hydroxide 10-800-165 MG chewable  tablet Commonly known as: PEPCID COMPLETE Chew 1 tablet by mouth daily as needed.   furosemide 20 MG tablet Commonly known as: LASIX Take 1 tablet (20  mg total) by mouth daily.   gabapentin 100 MG capsule Commonly known as: NEURONTIN TAKE 2-3 CAPSULES (200-300 MG TOTAL) BY MOUTH 3 (THREE) TIMES DAILY.   loperamide 2 MG tablet Commonly known as: IMODIUM A-D Take 2 mg by mouth 4 (four) times daily as needed for diarrhea or loose stools.   multivitamin per tablet Take 1 tablet by mouth daily.   NON FORMULARY Take 2 capsules by mouth 2 (two) times daily. Hydro Eye   olopatadine 0.1 % ophthalmic solution Commonly known as: PATANOL As directed   PEPTO-BISMOL PO Take by mouth.   potassium chloride 10 MEQ tablet Commonly known as: K-DUR Take 1 tablet (10 mEq total) by mouth daily. Started by: Kathlene November, MD   Potassium Gluconate 550 MG Tabs Take by mouth.   Potassium Gluconate 550 MG Tabs Take 1 tablet by mouth daily.   PROBIOTIC DAILY PO Take 1 capsule by mouth daily.   Sinus Pressure + Pain 5-325 MG Tabs Generic drug: Phenylephrine-Acetaminophen Take by mouth as directed.           Objective:   Physical Exam BP 118/63 (BP Location: Left Arm, Patient Position: Sitting, Cuff Size: Small)   Pulse 84   Temp 98.6 F (37 C) (Oral)   Resp 16   Ht 5\' 4"  (1.626 m)   Wt 158 lb 8 oz (71.9 kg)   SpO2 97%   BMI 27.21 kg/m  General:   Well developed, NAD, BMI noted. HEENT:  Normocephalic . Face symmetric, atraumatic Lungs:  CTA B Normal respiratory effort, no intercostal retractions, no accessory muscle use. Heart: RRR,  no murmur.  Lower extremities: Essentially unchanged, still have some swelling from mid pretibial area down, the area is moderately TTP. Inguinal area: No lymphadenopathies, normal femoral pulses Skin: Not pale. Not jaundice Neurologic:  alert & oriented X3.  Speech normal, gait appropriate for age and unassisted Psych--  Cognition and  judgment appear intact.  Cooperative with normal attention span and concentration.  Behavior appropriate. No anxious or depressed appearing.      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 SVT, saw Dr. Harrington Challenger 2014, on cardiazem NEURO: Migraines: on gabapentin, flexeril prn sees neurology ;d/c topamax ~ 08/17/17 (s/e)(Dr Domingo Cocking)   Chronic Dizzies : 2012/2013: w/u (-) including echo, MRA head-neck, Brain MRI, CT head.  06/2017 brain MRI wnl for age  Mild cognitive impairment: Per neurology note 10/2017. GI: Fecal incontinence, GI Dr. Michail Sermon H/o SBO 08-2015 Pulmonary nodules First seen by CT only 05/04/03, no change on f/u 08/08/06 -Macroscopic changes rml 04/27/07 > resolved april 6,2010 no further w/u  Nodules noted again: CT 08/2015  CT 04-2016: Stable, recheck in one year if high risk only. [facility]  Sees Dermatology  GU: Sees urology regularly, on daily antibiotics  PLAN: Lower extremity swelling: Since the last visit, x-ray was negative, uric acid normal.  Saw orthopedic surgery, was referred to vascular surgery for evaluation of lymphedema. I agree and recommend and encouraged her to pursue  the referral. Also, a CT of the abdomen on 08/07/2018 did not show any abnormality that could account for a lymphatic obstruction. HTN: On Lasix, BP looks very good, check a BMP. Depression: Started on low-dose of Lexapro, she feels that is helping.  Increase to 10 mg. Hallucinations: Was never  able to provide a urine sample for a culture.  Symptoms resolved. Recommend to see new PCP in 3 to 4 weeks, she is transferring to a office that is closer to home.

## 2018-11-07 NOTE — Telephone Encounter (Signed)
Ok with me to schedule TOC

## 2018-11-07 NOTE — Patient Instructions (Signed)
GO TO THE LAB : Get the blood work    Please see your doctor within 3 to 4 weeks  Increase Lexapro to 10 mg daily  Leg elevation

## 2018-11-08 DIAGNOSIS — H00022 Hordeolum internum right lower eyelid: Secondary | ICD-10-CM | POA: Diagnosis not present

## 2018-11-08 DIAGNOSIS — H00024 Hordeolum internum left upper eyelid: Secondary | ICD-10-CM | POA: Diagnosis not present

## 2018-11-08 DIAGNOSIS — H02401 Unspecified ptosis of right eyelid: Secondary | ICD-10-CM | POA: Diagnosis not present

## 2018-11-08 DIAGNOSIS — H00021 Hordeolum internum right upper eyelid: Secondary | ICD-10-CM | POA: Diagnosis not present

## 2018-11-08 NOTE — Assessment & Plan Note (Signed)
Lower extremity swelling: Since the last visit, x-ray was negative, uric acid normal.  Saw orthopedic surgery, was referred to vascular surgery for evaluation of lymphedema. I agree and recommend and encouraged her to pursue the referral. Also, a CT of the abdomen on 08/07/2018 did not show any abnormality that could account for a lymphatic obstruction. HTN: On Lasix, BP looks very good, check a BMP. Depression: Started on low-dose of Lexapro, she feels that is helping.  Increase to 10 mg. Hallucinations: Was never able to provide a urine sample for a culture.  Symptoms resolved. Recommend to see new PCP in 3 to 4 weeks, she is transferring to a office that is closer to home.

## 2018-11-08 NOTE — Telephone Encounter (Signed)
LMVM for the patient to contact our office to schedule a TOC appointment with Dr. Jerilee Hoh.

## 2018-11-09 ENCOUNTER — Other Ambulatory Visit: Payer: Self-pay | Admitting: Internal Medicine

## 2018-11-09 ENCOUNTER — Other Ambulatory Visit: Payer: Self-pay | Admitting: *Deleted

## 2018-11-09 MED ORDER — PROPRANOLOL HCL ER 60 MG PO CP24: 60.0000 mg | ORAL_CAPSULE | Freq: Every day | ORAL | 11 refills | Status: DC

## 2018-11-14 ENCOUNTER — Ambulatory Visit (INDEPENDENT_AMBULATORY_CARE_PROVIDER_SITE_OTHER): Payer: Medicare HMO | Admitting: Internal Medicine

## 2018-11-14 ENCOUNTER — Other Ambulatory Visit: Payer: Self-pay

## 2018-11-14 DIAGNOSIS — F329 Major depressive disorder, single episode, unspecified: Secondary | ICD-10-CM | POA: Diagnosis not present

## 2018-11-14 DIAGNOSIS — E785 Hyperlipidemia, unspecified: Secondary | ICD-10-CM

## 2018-11-14 DIAGNOSIS — R600 Localized edema: Secondary | ICD-10-CM

## 2018-11-14 DIAGNOSIS — F32A Depression, unspecified: Secondary | ICD-10-CM

## 2018-11-14 DIAGNOSIS — I10 Essential (primary) hypertension: Secondary | ICD-10-CM

## 2018-11-14 DIAGNOSIS — N182 Chronic kidney disease, stage 2 (mild): Secondary | ICD-10-CM

## 2018-11-14 DIAGNOSIS — R413 Other amnesia: Secondary | ICD-10-CM

## 2018-11-14 NOTE — Progress Notes (Signed)
Virtual Visit via Telephone Note  I connected with [name] on 11/14/18 at  3:00 PM EDT by telephone and verified that I am speaking with the correct person using two identifiers.   I discussed the limitations, risks, security and privacy concerns of performing an evaluation and management service by telephone and the availability of in person appointments. I also discussed with the patient that there may be a patient responsible charge related to this service. The patient expressed understanding and agreed to proceed.  Location patient: home Location provider: work office Participants present for the call: patient, provider Patient did not have a visit in the prior 7 days to address this/these issue(s).   History of Present Illness:  She has scheduled this visit to establish care.  She used to see Dr. Kathlene November but is wanting to have medical care closer to home as it is getting more and more difficult for her to drive.  Her past medical history is significant for:  1.  Hypertension/SVT well-controlled on propranolol  2.  Migraine headaches which she states are very rare now  3.  Osteoporosis; she had been started on Fosamax per prior report but she stopped it spontaneously due to "dental issues".  4.  Chronic kidney disease stage II-III with a baseline creatinine of about 0.90  5.  Hyperlipidemia on Lipitor  7.  Depression who was recently started on Lexapro.  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  She usually goes to church every week but has been limited in doing this due to the COVID-19 pandemic.  She has been very depressed lately.  Her brother died in 11-Jul-2022.  Her best friend and 2 other friends have passed away in the recent months 2.  She states "I have no one to talk to".  Dr. Larose Kells had started her on Lexapro and she states she has noticed some improvement with this.     She tells me that for the past few months she has been noticed increased short-term memory issues in the main concern in recent times is leg swelling for which she was started on Lasix and is dropped 8 pounds.  She was recently found to have hypokalemia and was started on potassium supplementation and was asked to have a repeat basic metabolic profile in 2 weeks.   Observations/Objective: Patient sounds cheerful and well on the phone. I do not appreciate any increased work of breathing. Speech and thought processing are grossly intact. Patient reported vitals: None reported   Current Outpatient Medications:    acetaminophen (TYLENOL) 325 MG tablet, Take 650 mg by mouth every 6 (six) hours as needed., Disp: , Rfl:    atorvastatin (LIPITOR) 40 MG tablet, Take 1 tablet (40 mg total) by mouth at bedtime., Disp: 90 tablet, Rfl: 2   azelastine (OPTIVAR) 0.05 % ophthalmic solution, Apply 1 drop to eye 2 (two) times a day., Disp: , Rfl:    Bismuth Subsalicylate (PEPTO-BISMOL PO), Take by mouth., Disp: , Rfl:    Ca [id number] (CALCIUM-FOLIC ACID PLUS D PO), Take by mouth daily., Disp: , Rfl:    celecoxib (CELEBREX) 200 MG capsule, Take 1 capsule (200 mg total) by mouth daily as needed for moderate pain., Disp: 90 capsule, Rfl: 1   CRANBERRY PO, Take 1 capsule by mouth daily., Disp: , Rfl:    cyclobenzaprine (FLEXERIL) 10 MG  tablet, Take 10 mg by mouth 3 (three) times daily as needed for muscle spasms., Disp: , Rfl:    dextromethorphan-guaiFENesin (MUCINEX DM) 30-600 MG 12hr tablet, Take 1 tablet by mouth 2 (two) times daily as needed for cough., Disp: , Rfl:    doxycycline (VIBRA-TABS) 100 MG tablet, Take 1 tablet (100 mg total) by mouth 2 (two) times daily. (Patient not taking: Reported on 11/07/2018), Disp: 14 tablet, Rfl: 0   escitalopram (LEXAPRO) 10 MG tablet, Take 1 tablet (10 mg total) by mouth daily., Disp: 30 tablet, Rfl: 1   famotidine-calcium carbonate-magnesium  hydroxide (PEPCID COMPLETE) 10-800-165 MG chewable tablet, Chew 1 tablet by mouth daily as needed., Disp: , Rfl:    furosemide (LASIX) 20 MG tablet, Take 1 tablet (20 mg total) by mouth daily., Disp: 30 tablet, Rfl: 0   gabapentin (NEURONTIN) 100 MG capsule, TAKE 2-3 CAPSULES (200-300 MG TOTAL) BY MOUTH 3 (THREE) TIMES DAILY., Disp: 810 capsule, Rfl: 1   loperamide (IMODIUM A-D) 2 MG tablet, Take 2 mg by mouth 4 (four) times daily as needed for diarrhea or loose stools., Disp: , Rfl:    multivitamin (THERAGRAN) per tablet, Take 1 tablet by mouth daily.  , Disp: , Rfl:    NON FORMULARY, Take 2 capsules by mouth 2 (two) times daily. [employer]	[place], Disp: , Rfl:    olopatadine (PATANOL) 0.1 % ophthalmic solution, As directed, Disp: , Rfl:    Phenylephrine-Acetaminophen (SINUS PRESSURE + PAIN) 5-325 MG TABS, Take by mouth as directed., Disp: , Rfl:    potassium chloride (K-DUR) 10 MEQ tablet, Take 1 tablet (10 mEq total) by mouth daily., Disp: 30 tablet, Rfl: 0   Potassium Gluconate 550 MG TABS, Take by mouth., Disp: , Rfl:    Potassium Gluconate 550 MG TABS, Take 1 tablet by mouth daily., Disp: , Rfl:    Probiotic Product (PROBIOTIC DAILY PO), Take 1 capsule by mouth daily. , Disp: , Rfl:    propranolol ER (INDERAL LA) 60 MG 24 hr capsule, Take 1 capsule (60 mg total) by mouth daily., Disp: 30 capsule, Rfl: 11  Review of Systems:  Constitutional: Denies fever, chills, diaphoresis, appetite change and fatigue.  HEENT: Denies photophobia, eye pain, redness, hearing loss, ear pain, congestion, sore throat, rhinorrhea, sneezing, mouth sores, trouble swallowing, neck pain, neck stiffness and tinnitus.   Respiratory: Denies SOB, DOE, cough, chest tightness,  and wheezing.   Cardiovascular: Denies chest pain, palpitations. Gastrointestinal: Denies nausea, vomiting, abdominal pain, diarrhea, constipation, blood in stool and abdominal distention.  Genitourinary: Denies dysuria, urgency, frequency,  hematuria, flank pain and difficulty urinating.  Endocrine: Denies: hot or cold intolerance, sweats, changes in hair or nails, polyuria, polydipsia. Musculoskeletal: Denies myalgias, back pain, joint swelling, arthralgias and gait problem.  Skin: Denies pallor, rash and wound.  Neurological: Denies dizziness, seizures, syncope, weakness, light-headedness, numbness and headaches.  Hematological: Denies adenopathy. Easy bruising, personal or family bleeding history  Psychiatric/Behavioral: Denies suicidal ideation, mood changes, confusion, nervousness, sleep disturbance and agitation   Assessment and Plan:  Hyperlipidemia, unspecified hyperlipidemia type  -Last LDL was 53 in September 2019, she has been maintained on Lipitor 40 mg daily.  Depression, unspecified depression type -Improved since starting Lexapro recently.  Essential hypertension  -Has been well controlled in the past.  CKD (chronic kidney disease) stage 2, GFR 60-89 ml/min  -Will need to monitor kidney function next visit  Edema of both lower extremities  -States it is a little improved since starting on Lasix recently. -She was  given potassium supplementation with a potassium of 3.2 and asked to follow-up in 2 weeks.  Memory loss  -Check MMSE next visit. -With her memory loss, recent depression will make sure we check a B12 and vitamin D in next visit. -Thyroid function was normal in June.    I discussed the assessment and treatment plan with the patient. The patient was provided an opportunity to ask questions and all were answered. The patient agreed with the plan and demonstrated an understanding of the instructions.   The patient was advised to call back or seek an in-person evaluation if the symptoms worsen or if the condition fails to improve as anticipated.  I provided 22 minutes of non-face-to-face time during this encounter.   Lelon Frohlich, MD [facility]

## 2018-11-15 ENCOUNTER — Other Ambulatory Visit: Payer: Self-pay | Admitting: Internal Medicine

## 2018-11-17 DIAGNOSIS — R269 Unspecified abnormalities of gait and mobility: Secondary | ICD-10-CM | POA: Diagnosis not present

## 2018-11-21 ENCOUNTER — Other Ambulatory Visit: Payer: Self-pay

## 2018-11-21 ENCOUNTER — Ambulatory Visit
Admission: RE | Admit: 2018-11-21 | Discharge: 2018-11-21 | Disposition: A | Discharge status: Home / Self Care | Payer: Medicare HMO | Source: Ambulatory Visit | Attending: Obstetrics and Gynecology | Admitting: Obstetrics and Gynecology

## 2018-11-21 DIAGNOSIS — R269 Unspecified abnormalities of gait and mobility: Secondary | ICD-10-CM | POA: Diagnosis not present

## 2018-11-21 DIAGNOSIS — Z1231 Encounter for screening mammogram for malignant neoplasm of breast: Secondary | ICD-10-CM

## 2018-11-22 ENCOUNTER — Telehealth (HOSPITAL_COMMUNITY): Payer: Self-pay | Admitting: Rehabilitation

## 2018-11-22 NOTE — Telephone Encounter (Signed)

## 2018-11-23 ENCOUNTER — Encounter: Payer: Medicare HMO | Admitting: Vascular Surgery

## 2018-11-23 ENCOUNTER — Other Ambulatory Visit: Payer: Self-pay

## 2018-11-23 ENCOUNTER — Ambulatory Visit (INDEPENDENT_AMBULATORY_CARE_PROVIDER_SITE_OTHER): Payer: Medicare HMO | Admitting: Internal Medicine

## 2018-11-23 ENCOUNTER — Encounter: Payer: Self-pay | Admitting: Internal Medicine

## 2018-11-23 VITALS — BP 140/90 | HR 73 | Temp 99.2°F | Wt 159.0 lb

## 2018-11-23 DIAGNOSIS — N182 Chronic kidney disease, stage 2 (mild): Secondary | ICD-10-CM

## 2018-11-23 DIAGNOSIS — F329 Major depressive disorder, single episode, unspecified: Secondary | ICD-10-CM | POA: Diagnosis not present

## 2018-11-23 DIAGNOSIS — I5030 Unspecified diastolic (congestive) heart failure: Secondary | ICD-10-CM | POA: Insufficient documentation

## 2018-11-23 DIAGNOSIS — E785 Hyperlipidemia, unspecified: Secondary | ICD-10-CM

## 2018-11-23 DIAGNOSIS — R600 Localized edema: Secondary | ICD-10-CM

## 2018-11-23 DIAGNOSIS — I10 Essential (primary) hypertension: Secondary | ICD-10-CM | POA: Diagnosis not present

## 2018-11-23 DIAGNOSIS — I5032 Chronic diastolic (congestive) heart failure: Secondary | ICD-10-CM

## 2018-11-23 DIAGNOSIS — R443 Hallucinations, unspecified: Secondary | ICD-10-CM | POA: Diagnosis not present

## 2018-11-23 DIAGNOSIS — R413 Other amnesia: Secondary | ICD-10-CM | POA: Diagnosis not present

## 2018-11-23 DIAGNOSIS — F32A Depression, unspecified: Secondary | ICD-10-CM

## 2018-11-23 LAB — URINALYSIS, ROUTINE W REFLEX MICROSCOPIC
Bilirubin Urine: NEGATIVE
Hgb urine dipstick: NEGATIVE
Ketones, ur: NEGATIVE
Leukocytes,Ua: NEGATIVE
Nitrite: NEGATIVE
Specific Gravity, Urine: 1.02 (ref 1.000–1.030)
Total Protein, Urine: NEGATIVE
Urine Glucose: NEGATIVE
Urobilinogen, UA: 0.2 (ref 0.0–1.0)
pH: 5.5 (ref 5.0–8.0)

## 2018-11-23 LAB — VITAMIN B12: Vitamin B-12: 256 pg/mL (ref 211–911)

## 2018-11-23 LAB — COMPREHENSIVE METABOLIC PANEL
ALT: 7 U/L (ref 0–35)
AST: 16 U/L (ref 0–37)
Albumin: 4.2 g/dL (ref 3.5–5.2)
Alkaline Phosphatase: 73 U/L (ref 39–117)
BUN: 16 mg/dL (ref 6–23)
CO2: 29 mEq/L (ref 19–32)
Calcium: 9.8 mg/dL (ref 8.4–10.5)
Chloride: 104 mEq/L (ref 96–112)
Creatinine, Ser: 0.95 mg/dL (ref 0.40–1.20)
GFR: 55.67 mL/min — ABNORMAL LOW (ref >60.00)
Glucose, Bld: 121 mg/dL — ABNORMAL HIGH (ref 70–99)
Potassium: 3.4 mEq/L — ABNORMAL LOW (ref 3.5–5.1)
Sodium: 141 mEq/L (ref 135–145)
Total Bilirubin: 0.6 mg/dL (ref 0.2–1.2)
Total Protein: 6.4 g/dL (ref 6.0–8.3)

## 2018-11-23 LAB — VITAMIN D 25 HYDROXY (VIT D DEFICIENCY, FRACTURES): VITD: 44.18 ng/mL (ref 30.00–100.00)

## 2018-11-23 NOTE — Patient Instructions (Signed)
-  Nice meeting you today!!  -Lab work today; will notify you once results are available.  -Continue taking lasix 20 mg daily.

## 2018-11-23 NOTE — Progress Notes (Signed)
Established Patient Office Visit     CC/Reason for Visit: Follow-up for memory issues and blood pressure and lower extremity edema  HPI: [name] is a 85 y.o. female who is coming in today for the above mentioned reasons. Past Medical History is significant for:   1.  Hypertension/SVT well-controlled on propranolol  2.  Migraine headaches which she states are very rare now  3.  Osteoporosis; she had been started on Fosamax per prior report but she stopped it spontaneously due to "dental issues".  4.  Chronic kidney disease stage II-III with a baseline creatinine of about 0.90  5.  Hyperlipidemia on Lipitor  7.  Depression who was recently started on Lexapro.  Last video visit she had some concerns about her memory loss, gets lost frequently while driving.  She continues to have concerns about bilateral lower extremity edema.  They have somewhat improved on Lasix therapy but not all the way.   Past Medical/Surgical History: Past Medical History:  Diagnosis Date  . Anxiety and depression   . Back pain    lumbar  . Fecal incontinence    Dr. Michail Sermon, likely due to loose stools in conjunction with pelvic muscle laxity from aging, recommended to stop Miralax  . GI (gastrointestinal bleed)    hx of colon polyps 2004, diverticulosis, internal hemmorhids  . Headache   . Headache(784.0)   . Hydronephrosis    Right 2008 s/p urology eval CT/cysto: observation/monitor  . Hyperlipidemia   . Hypertension   . Knee pain    right chronic  . Migraine    on topamax , per neuro  . Osteoporosis   . Pulmonary nodules    [place]. First seen by CT only 05/04/03, no change on f/u 08/08/06 -Macroscopic changes rml 04/27/07 > resolved april 6,2010 no further w/u   . Seborrheic keratosis    Left inferior posterior flank  . SVT (supraventricular tachycardia) (HCC)    sees Dr.Ross    Past Surgical History:  Procedure Laterality Date  . ABDOMINAL HYSTERECTOMY    .  APPENDECTOMY    . BASAL CELL CARCINOMA EXCISION    . BLADDER REPAIR    . CATARACT EXTRACTION  2008   Dr.Digby  . CHOLECYSTECTOMY    . lumbar reconstructive surgery  01/07/2009  . R ovary removed    . ROTATOR CUFF REPAIR  9/-2011  . SHOULDER SURGERY  12-2011   LEFT  . TONSILLECTOMY    . TOTAL KNEE ARTHROPLASTY     right  . trigger finger surgery     L thumb (2014), R ring finger 05-2013    Social History:  reports that she has never smoked. She has never used smokeless tobacco. She reports current alcohol use of about 1.0 standard drinks of alcohol per week. She reports that she does not use drugs.  Allergies: Allergies  Allergen Reactions  . Amitriptyline Hcl     REACTION: hallucinations  . Carisoprodol     REACTION: nightmares,rash,tachycardia  . Codeine     REACTION: "deathly ill" nauesa, dizzy,weak  . Fosamax [Alendronate Sodium] Other (See Comments)    Dental issues  . Meloxicam     REACTION: sensitive to sun; facial redness; may cause severe abd pain  . Metronidazole     REACTION: abd pain  . Nortriptyline Hcl     REACTION: hallucinations  . Oxaprozin     REACTION: abd pain  . Promethazine Hcl     REACTION: spastic arms and legs  flailing while awake and unusual behavior while sleep - walking all over house during night  . Rofecoxib     REACTION: rash  . Sumatriptan     REACTION: abdominal pain, blurry vision, drowsiness  . Tramadol Other (See Comments)    Dizziness and constipation  . Venlafaxine     Hallucinations     Family History:  Family History  Problem Relation Age of Onset  . Breast cancer Mother        later in life  . Diabetes Mother        late in life  . Heart disease Mother        late onset  . Lung cancer Father        apparently had TB  . Heart disease Father        late onset  . Prostate cancer Father   . Colitis Brother   . Cancer Brother   . Colon cancer Neg Hx      Current Outpatient Medications:  .  acetaminophen  (TYLENOL) 325 MG tablet, Take 650 mg by mouth every 6 (six) hours as needed., Disp: , Rfl:  .  atorvastatin (LIPITOR) 40 MG tablet, Take 1 tablet (40 mg total) by mouth at bedtime., Disp: 90 tablet, Rfl: 2 .  azelastine (OPTIVAR) 0.05 % ophthalmic solution, Apply 1 drop to eye 2 (two) times a day., Disp: , Rfl:  .  Bismuth Subsalicylate (PEPTO-BISMOL PO), Take by mouth., Disp: , Rfl:  .  Ca [id number] (CALCIUM-FOLIC ACID PLUS D PO), Take by mouth daily., Disp: , Rfl:  .  celecoxib (CELEBREX) 200 MG capsule, Take 1 capsule (200 mg total) by mouth daily as needed for moderate pain., Disp: 90 capsule, Rfl: 1 .  CRANBERRY PO, Take 1 capsule by mouth daily., Disp: , Rfl:  .  cyclobenzaprine (FLEXERIL) 10 MG tablet, Take 10 mg by mouth 3 (three) times daily as needed for muscle spasms., Disp: , Rfl:  .  dextromethorphan-guaiFENesin (MUCINEX DM) 30-600 MG 12hr tablet, Take 1 tablet by mouth 2 (two) times daily as needed for cough., Disp: , Rfl:  .  escitalopram (LEXAPRO) 10 MG tablet, Take 1 tablet (10 mg total) by mouth daily., Disp: 30 tablet, Rfl: 1 .  famotidine-calcium carbonate-magnesium hydroxide (PEPCID COMPLETE) 10-800-165 MG chewable tablet, Chew 1 tablet by mouth daily as needed., Disp: , Rfl:  .  furosemide (LASIX) 20 MG tablet, TAKE 1 TABLET BY MOUTH EVERY DAY, Disp: 30 tablet, Rfl: 0 .  gabapentin (NEURONTIN) 100 MG capsule, TAKE 2-3 CAPSULES (200-300 MG TOTAL) BY MOUTH 3 (THREE) TIMES DAILY., Disp: 810 capsule, Rfl: 1 .  loperamide (IMODIUM A-D) 2 MG tablet, Take 2 mg by mouth 4 (four) times daily as needed for diarrhea or loose stools., Disp: , Rfl:  .  multivitamin (THERAGRAN) per tablet, Take 1 tablet by mouth daily.  , Disp: , Rfl:  .  NON FORMULARY, Take 2 capsules by mouth 2 (two) times daily. [employer]	[place], Disp: , Rfl:  .  olopatadine (PATANOL) 0.1 % ophthalmic solution, As directed, Disp: , Rfl:  .  Phenylephrine-Acetaminophen (SINUS PRESSURE + PAIN) 5-325 MG TABS, Take  by mouth as directed., Disp: , Rfl:  .  potassium chloride (K-DUR) 10 MEQ tablet, Take 1 tablet (10 mEq total) by mouth daily., Disp: 30 tablet, Rfl: 0 .  Potassium Gluconate 550 MG TABS, Take by mouth., Disp: , Rfl:  .  Potassium Gluconate 550 MG TABS, Take 1 tablet by mouth daily., Disp: ,  Rfl:  .  Probiotic Product (PROBIOTIC DAILY PO), Take 1 capsule by mouth daily. , Disp: , Rfl:  .  propranolol ER (INDERAL LA) 60 MG 24 hr capsule, Take 1 capsule (60 mg total) by mouth daily., Disp: 30 capsule, Rfl: 11  Review of Systems:  Constitutional: Denies fever, chills, diaphoresis, appetite change and fatigue.  HEENT: Denies photophobia, eye pain, redness, hearing loss, ear pain, congestion, sore throat, rhinorrhea, sneezing, mouth sores, trouble swallowing, neck pain, neck stiffness and tinnitus.   Respiratory: Denies SOB, DOE, cough, chest tightness,  and wheezing.   Cardiovascular: Denies chest pain, palpitations. Gastrointestinal: Denies nausea, vomiting, abdominal pain, diarrhea, constipation, blood in stool and abdominal distention.  Genitourinary: Denies dysuria, urgency, frequency, hematuria, flank pain and difficulty urinating.  Endocrine: Denies: hot or cold intolerance, sweats, changes in hair or nails, polyuria, polydipsia. Musculoskeletal: Denies myalgias, back pain, joint swelling, arthralgias and gait problem.  Skin: Denies pallor, rash and wound.  Neurological: Denies dizziness, seizures, syncope, weakness, light-headedness, numbness and headaches.  Hematological: Denies adenopathy. Easy bruising, personal or family bleeding history  Psychiatric/Behavioral: Denies suicidal ideation, mood changes, confusion, nervousness, sleep disturbance and agitation    Physical Exam: Vitals:   11/23/18 1302  BP: 140/90  Pulse: 73  Temp: 99.2 F (37.3 C)  TempSrc: Temporal  SpO2: 94%  Weight: 159 lb (72.1 kg)    Body mass index is 27.29 kg/m.   Constitutional: NAD, calm,  comfortable Eyes: PERRL, lids and conjunctivae normal ENMT: Mucous membranes are moist.  Respiratory: clear to auscultation bilaterally, no wheezing, no crackles. Normal respiratory effort. No accessory muscle use.  Cardiovascular: Regular rate and rhythm, no murmurs / rubs / gallops. No extremity edema. 2+ pedal pulses. No carotid bruits.  Abdomen: no tenderness, no masses palpated. No hepatosplenomegaly. Bowel sounds positive.  Musculoskeletal: no clubbing / cyanosis. No joint deformity upper and lower extremities. Good ROM, no contractures. Normal muscle tone.  Skin: no rashes, lesions, ulcers. No induration Neurologic: Grossly intact and nonfocal  Psychiatric: Normal judgment and insight. Alert and oriented x 3. Normal mood.    MMSE - Mini Mental State Exam 11/23/2018 12/06/2017 06/03/2016  Orientation to time 4 5 5   Orientation to Place 5 5 5   Registration 3 3 3   Attention/ Calculation 5 5 5   Recall 3 3 3   Language- name 2 objects 2 2 2   Language- repeat 1 1 1   Language- follow 3 step command 3 3 3   Language- read & follow direction 1 1 1   Write a sentence 1 1 1   Copy design 1 1 1   Total score 29 30 30       Impression and Plan:  Edema of both lower extremities -Review of 2D echo from January 2019 shows an ejection fraction of 55 to 60%, no wall motion abnormalities but grade 1 diastolic dysfunction. -Suspect lower extremity edema due to diastolic heart failure in addition to chronic kidney disease, plus minus hypoalbuminemia. -Check comprehensive metabolic panel today. -Continue Lasix 20 mg daily.  Hyperlipidemia, unspecified hyperlipidemia type -Last LDL was 53 in September 2019, she is on 40 mg of Lipitor daily.  Depression, unspecified depression type -Mood is stable, does not appear to be depressed, on Lexapro.  Essential hypertension -Blood pressure is a little elevated in office today, she states this is due to anxiety over meeting a new provider and getting lost on  her way to the office. -She states her blood pressure is usually in the 110/60 range and that is what it  was this morning.  CKD (chronic kidney disease) stage 2, GFR 60-89 ml/min -Last known creatinine was 0.9.  Recheck today.  Chronic heart failure with preserved ejection fraction ([place]) -2D echo as above, continue Lasix.  Is on beta-blocker, no ACE inhibitor/ARB due to renal insufficiency.  Memory loss/balance issues -Check TSH, B12, vitamin D. -She has scored a 29 on MMSE.   Patient Instructions  -Nice meeting you today!!  -Lab work today; will notify you once results are available.  -Continue taking lasix 20 mg daily.     Lelon Frohlich, MD [facility]

## 2018-11-24 ENCOUNTER — Other Ambulatory Visit: Payer: Self-pay | Admitting: Internal Medicine

## 2018-11-24 DIAGNOSIS — R600 Localized edema: Secondary | ICD-10-CM

## 2018-11-24 DIAGNOSIS — R269 Unspecified abnormalities of gait and mobility: Secondary | ICD-10-CM | POA: Diagnosis not present

## 2018-11-24 MED ORDER — POTASSIUM CHLORIDE ER 20 MEQ PO TBCR: 20.0000 meq | EXTENDED_RELEASE_TABLET | Freq: Every day | ORAL | 1 refills | Status: DC

## 2018-11-28 DIAGNOSIS — R269 Unspecified abnormalities of gait and mobility: Secondary | ICD-10-CM | POA: Diagnosis not present

## 2018-11-29 ENCOUNTER — Other Ambulatory Visit: Payer: Self-pay | Admitting: Internal Medicine

## 2018-12-01 DIAGNOSIS — R269 Unspecified abnormalities of gait and mobility: Secondary | ICD-10-CM | POA: Diagnosis not present

## 2018-12-04 DIAGNOSIS — R269 Unspecified abnormalities of gait and mobility: Secondary | ICD-10-CM | POA: Diagnosis not present

## 2018-12-05 DIAGNOSIS — G518 Other disorders of facial nerve: Secondary | ICD-10-CM | POA: Diagnosis not present

## 2018-12-05 DIAGNOSIS — M542 Cervicalgia: Secondary | ICD-10-CM | POA: Diagnosis not present

## 2018-12-05 DIAGNOSIS — M791 Myalgia, unspecified site: Secondary | ICD-10-CM | POA: Diagnosis not present

## 2018-12-05 DIAGNOSIS — G43719 Chronic migraine without aura, intractable, without status migrainosus: Secondary | ICD-10-CM | POA: Diagnosis not present

## 2018-12-05 DIAGNOSIS — G43019 Migraine without aura, intractable, without status migrainosus: Secondary | ICD-10-CM | POA: Diagnosis not present

## 2018-12-07 ENCOUNTER — Encounter: Payer: Self-pay | Admitting: Vascular Surgery

## 2018-12-07 ENCOUNTER — Other Ambulatory Visit: Payer: Self-pay

## 2018-12-07 ENCOUNTER — Ambulatory Visit (INDEPENDENT_AMBULATORY_CARE_PROVIDER_SITE_OTHER): Payer: Medicare HMO | Admitting: Vascular Surgery

## 2018-12-07 VITALS — BP 131/75 | HR 78 | Temp 98.0°F | Resp 14 | Ht 64.0 in | Wt 160.9 lb

## 2018-12-07 DIAGNOSIS — M7989 Other specified soft tissue disorders: Secondary | ICD-10-CM | POA: Diagnosis not present

## 2018-12-07 NOTE — Progress Notes (Signed)
Referring Physician: Dr Doran Durand  Patient name: [name] MRN: [id number] DOB: [birth year] Sex: female  REASON FOR CONSULT: Foot and ankle swelling  HPI: [name] is a 85 y.o. female, with a 1 year history of left foot and ankle swelling.  This is progressed more over the last few months.  Patient has had a fairly extensive work-up including a serum albumin of 4.2.  Serum creatinine of 0.9.  Normal urinalysis.  And an essentially normal echocardiogram in the last year.  She also had a DVT ultrasound June 2020 which showed no evidence of DVT and no reflux.  She has had fractures in her left foot in the past.  Other medical problems include solitary functioning kidney, migraine headaches, hyperlipidemia, hypertension all of which have been stable.  The patient also has had some difficulty with memory recently.  She states that recently she could not remember where her refrigerator was in the house.  She also states she gets confused or forgets things more often than she used to.  This is currently under evaluation by her primary care physician Dr. Jerilee Hoh.  The patient has intermittently worn some compression stockings but states that they are difficult to put on.  She has no prior history of DVT.  Past Medical History:  Diagnosis Date  . Anxiety and depression   . Back pain    lumbar  . Fecal incontinence    Dr. Michail Sermon, likely due to loose stools in conjunction with pelvic muscle laxity from aging, recommended to stop Miralax  . GI (gastrointestinal bleed)    hx of colon polyps 2004, diverticulosis, internal hemmorhids  . Headache   . Headache(784.0)   . Hydronephrosis    Right 2008 s/p urology eval CT/cysto: observation/monitor  . Hyperlipidemia   . Hypertension   . Knee pain    right chronic  . Migraine    on topamax , per neuro  . Osteoporosis   . Pulmonary nodules    Wert. First seen by CT only 05/04/03, no change on f/u 08/08/06 -Macroscopic changes  rml 04/27/07 > resolved april 6,2010 no further w/u   . Seborrheic keratosis    Left inferior posterior flank  . SVT (supraventricular tachycardia) (HCC)    sees Dr.Ross   Past Surgical History:  Procedure Laterality Date  . ABDOMINAL HYSTERECTOMY    . APPENDECTOMY    . BASAL CELL CARCINOMA EXCISION    . BLADDER REPAIR    . CATARACT EXTRACTION  2008   Dr.Digby  . CHOLECYSTECTOMY    . lumbar reconstructive surgery  01/07/2009  . R ovary removed    . ROTATOR CUFF REPAIR  9/-2011  . SHOULDER SURGERY  12-2011   LEFT  . TONSILLECTOMY    . TOTAL KNEE ARTHROPLASTY     right  . trigger finger surgery     L thumb (2014), R ring finger 05-2013    Family History  Problem Relation Age of Onset  . Breast cancer Mother        later in life  . Diabetes Mother        late in life  . Heart disease Mother        late onset  . Lung cancer Father        apparently had TB  . Heart disease Father        late onset  . Prostate cancer Father   . Colitis Brother   . Cancer Brother   . Colon  cancer Neg Hx     SOCIAL HISTORY: Social History   Socioeconomic History  . Marital status: Widowed    Spouse name: Not on file  . Number of children: 1  . Years of education: college   . Highest education level: Doctorate  Occupational History  . Occupation: retired   Scientific laboratory technician  . Financial resource strain: Not on file  . Food insecurity    Worry: Not on file    Inability: Not on file  . Transportation needs    Medical: Not on file    Non-medical: Not on file  Tobacco Use  . Smoking status: Never Smoker  . Smokeless tobacco: Never Used  Substance and Sexual Activity  . Alcohol use: Yes    Alcohol/week: 1.0 standard drinks    Types: 1 Glasses of wine per week    Comment: rarely, 1 glass of wine monthly  . Drug use: No  . Sexual activity: Not Currently  Lifestyle  . Physical activity    Days per week: Not on file    Minutes per session: Not on file  . Stress: Not on file   Relationships  . Social Herbalist on phone: Not on file    Gets together: Not on file    Attends religious service: Not on file    Active member of club or organization: Not on file    Attends meetings of clubs or organizations: Not on file    Relationship status: Not on file  . Intimate partner violence    Fear of current or ex partner: Not on file    Emotionally abused: Not on file    Physically abused: Not on file    Forced sexual activity: Not on file  Other Topics Concern  . Not on file  Social History Narrative   Moved from a retirement home to a condominium 04-2013, independent living,  lives by herself , drives    Son lives in [place]   Right handed    1 cup of caffeine per day     Allergies  Allergen Reactions  . Amitriptyline Hcl     REACTION: hallucinations  . Carisoprodol     REACTION: nightmares,rash,tachycardia  . Codeine     REACTION: "deathly ill" nauesa, dizzy,weak  . Fosamax [Alendronate Sodium] Other (See Comments)    Dental issues  . Meloxicam     REACTION: sensitive to sun; facial redness; may cause severe abd pain  . Metronidazole     REACTION: abd pain  . Nortriptyline Hcl     REACTION: hallucinations  . Oxaprozin     REACTION: abd pain  . Promethazine Hcl     REACTION: spastic arms and legs flailing while awake and unusual behavior while sleep - walking all over house during night  . Rofecoxib     REACTION: rash  . Sumatriptan     REACTION: abdominal pain, blurry vision, drowsiness  . Tramadol Other (See Comments)    Dizziness and constipation  . Venlafaxine     Hallucinations     Current Outpatient Medications  Medication Sig Dispense Refill  . acetaminophen (TYLENOL) 325 MG tablet Take 650 mg by mouth every 6 (six) hours as needed.    Marland Kitchen atorvastatin (LIPITOR) 40 MG tablet Take 1 tablet (40 mg total) by mouth at bedtime. 90 tablet 2  . Bismuth Subsalicylate (PEPTO-BISMOL PO) Take by mouth.    . Ca [id number]  (CALCIUM-FOLIC ACID PLUS D PO) Take by mouth  daily.    . celecoxib (CELEBREX) 200 MG capsule Take 1 capsule (200 mg total) by mouth daily as needed for moderate pain. 90 capsule 1  . CRANBERRY PO Take 1 capsule by mouth daily.    . cyclobenzaprine (FLEXERIL) 10 MG tablet Take 10 mg by mouth 3 (three) times daily as needed for muscle spasms.    Marland Kitchen dextromethorphan-guaiFENesin (MUCINEX DM) 30-600 MG 12hr tablet Take 1 tablet by mouth 2 (two) times daily as needed for cough.    . escitalopram (LEXAPRO) 10 MG tablet TAKE 1 TABLET BY MOUTH EVERY DAY 90 tablet 1  . famotidine-calcium carbonate-magnesium hydroxide (PEPCID COMPLETE) 10-800-165 MG chewable tablet Chew 1 tablet by mouth daily as needed.    . furosemide (LASIX) 20 MG tablet TAKE 1 TABLET BY MOUTH EVERY DAY 30 tablet 0  . gabapentin (NEURONTIN) 100 MG capsule TAKE 2-3 CAPSULES (200-300 MG TOTAL) BY MOUTH 3 (THREE) TIMES DAILY. 810 capsule 1  . glucosamine-chondroitin 500-400 MG tablet Take 1 tablet by mouth 3 (three) times daily.    . Liniments (BLUE-EMU SUPER STRENGTH) CREA Apply topically.    Marland Kitchen loperamide (IMODIUM A-D) 2 MG tablet Take 2 mg by mouth 4 (four) times daily as needed for diarrhea or loose stools.    . multivitamin (THERAGRAN) per tablet Take 1 tablet by mouth daily.      . NON FORMULARY Take 2 capsules by mouth 2 (two) times daily. Hydro Eye    . olopatadine (PATANOL) 0.1 % ophthalmic solution As directed    . Omega-3 Fatty Acids (FISH OIL) 1000 MG CAPS Take by mouth.    . Phenylephrine-Acetaminophen (SINUS PRESSURE + PAIN) 5-325 MG TABS Take by mouth as directed.    . potassium chloride (K-DUR) 10 MEQ tablet TAKE 1 TABLET BY MOUTH EVERY DAY 30 tablet 0  . Probiotic Product (PROBIOTIC DAILY PO) Take 1 capsule by mouth daily.     . Pumpkin Seed-Soy Germ (AZO BLADDER CONTROL/GO-LESS) CAPS Take by mouth.    . simethicone (GAS-X) 80 MG chewable tablet Chew 80 mg by mouth every 6 (six) hours as needed for flatulence.     No  current facility-administered medications for this visit.     ROS:   General:  No weight loss, Fever, chills  HEENT: No recent headaches, no nasal bleeding, no visual changes, no sore throat  Neurologic: No dizziness, blackouts, seizures. No recent symptoms of stroke or mini- stroke. No recent episodes of slurred speech, or temporary blindness.  Cardiac: No recent episodes of chest pain/pressure, no shortness of breath at rest.  + shortness of breath with exertion.  Denies history of atrial fibrillation or irregular heartbeat  Vascular: No history of rest pain in feet.  No history of claudication.  No history of non-healing ulcer, No history of DVT   Pulmonary: No home oxygen, no productive cough, no hemoptysis,  + wheezing  Musculoskeletal:  [X]  Arthritis, [ ]  Low back pain,  [X]  Joint pain  Hematologic:No history of hypercoagulable state.  No history of easy bleeding.  No history of anemia  Gastrointestinal: No hematochezia or melena,  No gastroesophageal reflux, no trouble swallowing  Urinary: [ ]  chronic Kidney disease, [ ]  on HD - [ ]  MWF or [ ]  TTHS, [ ]  Burning with urination, [ ]  Frequent urination, [ ]  Difficulty urinating;   Skin: No rashes  Psychological: No history of anxiety,  No history of depression   Physical Examination  Vitals:   12/07/18 1406  BP: 131/75  Pulse: 78  Resp: 14  Temp: 98 F (36.7 C)  TempSrc: Temporal  SpO2: 97%  Weight: 160 lb 14.4 oz (73 kg)  Height: 5\' 4"  (1.626 m)    Body mass index is 27.62 kg/m.  General:  Alert and oriented, no acute distress HEENT: Normal Neck: No JVD Pulmonary: Clear to auscultation bilaterally Cardiac: Regular Rate and Rhythm Abdomen: Soft, non-tender, non-distended Skin: No rash, no ulcer Extremity Pulses:  2+ radial, brachial, femoral, nonpalpable dorsalis pedis, posterior tibial pulses bilaterally Musculoskeletal: No deformity trace ankle edema bilaterally left foot is edematous down to the toes no  erythema or fluctuance edema  Neurologic: Upper and lower extremity motor 5/5 and symmetric  ASSESSMENT: Patient with lower extremity edema of unknown etiology.  Venous etiology renal etiology cardiac and nutritional etiologies of all been ruled out.  Most of her edema is confined to the ankle although she does get pedal edema as well.   PLAN: Reassured the patient that this did not involve an arterial or venous etiology.  Certainly this could be a type of lymphedema but usually the edema is less pitting and has not really go away with elevation as her seems to.  I discussed with her wearing compression stockings and discussed with her that she would have to make a decision which was more bothersome to her the leg swelling where the compression stockings.  Unfortunately this is really the only treatment option we would have to offer her.  She will follow-up on an as-needed basis.   Ruta Hinds, MD [facility] Office: [phone] Pager: [phone]

## 2018-12-08 ENCOUNTER — Ambulatory Visit: Payer: Medicare Other | Admitting: *Deleted

## 2018-12-08 DIAGNOSIS — R269 Unspecified abnormalities of gait and mobility: Secondary | ICD-10-CM | POA: Diagnosis not present

## 2018-12-12 DIAGNOSIS — R269 Unspecified abnormalities of gait and mobility: Secondary | ICD-10-CM | POA: Diagnosis not present

## 2018-12-19 ENCOUNTER — Other Ambulatory Visit: Payer: Self-pay | Admitting: Internal Medicine

## 2018-12-26 ENCOUNTER — Telehealth: Payer: Self-pay | Admitting: *Deleted

## 2018-12-26 NOTE — Telephone Encounter (Signed)
Copied from [place] [id number]. Topic: Appointment Scheduling - Scheduling Inquiry for Clinic >> Dec 26, 2018  3:35 PM Scherrie Gerlach wrote: Reason for CRM: pt wants to know when she should return to see dr Jerilee Hoh.  Dr Larose Kells had her coming every 6 months.  Also what is she to do about Med well visit on 9/24 that needs to be rescheduled?

## 2018-12-26 NOTE — Telephone Encounter (Signed)
Spoke with patient and she will sign a ROI at next office visit.

## 2018-12-26 NOTE — Telephone Encounter (Signed)
Spoke with patient and an appointment scheduled 

## 2018-12-26 NOTE — Telephone Encounter (Signed)
Copied from [place] [id number]. Topic: General - Inquiry >> Dec 26, 2018  1:50 PM Scherrie Gerlach wrote: Reason for CRM: pt would like Dr Jerilee Hoh get a copy of her report from Dr One[facility]  [facility] will not send because Dr Larose Kells had ordered.

## 2019-01-04 ENCOUNTER — Ambulatory Visit (INDEPENDENT_AMBULATORY_CARE_PROVIDER_SITE_OTHER): Payer: Medicare HMO | Admitting: Internal Medicine

## 2019-01-04 ENCOUNTER — Other Ambulatory Visit: Payer: Self-pay

## 2019-01-04 DIAGNOSIS — I5032 Chronic diastolic (congestive) heart failure: Secondary | ICD-10-CM | POA: Diagnosis not present

## 2019-01-04 DIAGNOSIS — R600 Localized edema: Secondary | ICD-10-CM | POA: Diagnosis not present

## 2019-01-04 NOTE — Progress Notes (Signed)
Virtual Visit via Telephone Note  I connected with [name] on 01/04/19 at  4:00 PM EDT by telephone and verified that I am speaking with the correct person using two identifiers.   I discussed the limitations, risks, security and privacy concerns of performing an evaluation and management service by telephone and the availability of in person appointments. I also discussed with the patient that there may be a patient responsible charge related to this service. The patient expressed understanding and agreed to proceed.  Location patient: home Location provider: work office Participants present for the call: patient, provider Patient did not have a visit in the prior 7 days to address this/these issue(s).   History of Present Illness:  She has scheduled this visit to follow-up on her lower extremity edema.  Since she last saw me she also saw Dr. Oneida Alar with vascular.  He agrees that this does not appear to be an arterial insufficiency.  He has again recommended compression stockings.  Patient is noticed to have diastolic congestive heart failure on prior echo which is also likely contributing.  Patient has tried compression stockings but did not like them as they were quite difficult to put on.  Lasix has also caused 2 episodes of urinary incontinence which makes her embarrassed to go out in public.  She continues to struggle with memory issues.  She has recently had work-up including vitamin B12 and TSH that were normal.  She scored a 29 on MMSE.   Observations/Objective: Patient sounds cheerful and well on the phone. I do not appreciate any increased work of breathing. Speech and thought processing are grossly intact. Patient reported vitals: None reported   Current Outpatient Medications:  .  acetaminophen (TYLENOL) 325 MG tablet, Take 650 mg by mouth every 6 (six) hours as needed., Disp: , Rfl:  .  atorvastatin (LIPITOR) 40 MG tablet, Take 1 tablet (40 mg total) by  mouth at bedtime., Disp: 90 tablet, Rfl: 2 .  Bismuth Subsalicylate (PEPTO-BISMOL PO), Take by mouth., Disp: , Rfl:  .  Ca Q000111Q (CALCIUM-FOLIC ACID PLUS D PO), Take by mouth daily., Disp: , Rfl:  .  celecoxib (CELEBREX) 200 MG capsule, TAKE 1 CAPSULE (200 MG TOTAL) BY MOUTH DAILY AS NEEDED FOR MODERATE PAIN., Disp: 90 capsule, Rfl: 1 .  CRANBERRY PO, Take 1 capsule by mouth daily., Disp: , Rfl:  .  cyclobenzaprine (FLEXERIL) 10 MG tablet, Take 10 mg by mouth 3 (three) times daily as needed for muscle spasms., Disp: , Rfl:  .  dextromethorphan-guaiFENesin (MUCINEX DM) 30-600 MG 12hr tablet, Take 1 tablet by mouth 2 (two) times daily as needed for cough., Disp: , Rfl:  .  escitalopram (LEXAPRO) 10 MG tablet, TAKE 1 TABLET BY MOUTH EVERY DAY, Disp: 90 tablet, Rfl: 1 .  famotidine-calcium carbonate-magnesium hydroxide (PEPCID COMPLETE) 10-800-165 MG chewable tablet, Chew 1 tablet by mouth daily as needed., Disp: , Rfl:  .  furosemide (LASIX) 20 MG tablet, TAKE 1 TABLET BY MOUTH EVERY DAY, Disp: 30 tablet, Rfl: 0 .  gabapentin (NEURONTIN) 100 MG capsule, TAKE 2-3 CAPSULES (200-300 MG TOTAL) BY MOUTH 3 (THREE) TIMES DAILY., Disp: 810 capsule, Rfl: 1 .  glucosamine-chondroitin 500-400 MG tablet, Take 1 tablet by mouth 3 (three) times daily., Disp: , Rfl:  .  Liniments (BLUE-EMU SUPER STRENGTH) CREA, Apply topically., Disp: , Rfl:  .  loperamide (IMODIUM A-D) 2 MG tablet, Take 2 mg by mouth 4 (four) times daily as needed for diarrhea or loose  stools., Disp: , Rfl:  .  multivitamin (THERAGRAN) per tablet, Take 1 tablet by mouth daily.  , Disp: , Rfl:  .  NON FORMULARY, Take 2 capsules by mouth 2 (two) times daily. [employer]	[place], Disp: , Rfl:  .  olopatadine (PATANOL) 0.1 % ophthalmic solution, As directed, Disp: , Rfl:  .  Omega-3 Fatty Acids (FISH OIL) 1000 MG CAPS, Take by mouth., Disp: , Rfl:  .  Phenylephrine-Acetaminophen (SINUS PRESSURE + PAIN) 5-325 MG TABS, Take by mouth as  directed., Disp: , Rfl:  .  potassium chloride (K-DUR) 10 MEQ tablet, TAKE 1 TABLET BY MOUTH EVERY DAY, Disp: 30 tablet, Rfl: 0 .  Probiotic Product (PROBIOTIC DAILY PO), Take 1 capsule by mouth daily. , Disp: , Rfl:  .  Pumpkin Seed-Soy Germ (AZO BLADDER CONTROL/GO-LESS) CAPS, Take by mouth., Disp: , Rfl:  .  simethicone (GAS-X) 80 MG chewable tablet, Chew 80 mg by mouth every 6 (six) hours as needed for flatulence., Disp: , Rfl:   Review of Systems:  Constitutional: Denies fever, chills, diaphoresis, appetite change and fatigue.  HEENT: Denies photophobia, eye pain, redness, hearing loss, ear pain, congestion, sore throat, rhinorrhea, sneezing, mouth sores, trouble swallowing, neck pain, neck stiffness and tinnitus.   Respiratory: Denies SOB, DOE, cough, chest tightness,  and wheezing.   Cardiovascular: Denies chest pain, palpitations and leg swelling.  Gastrointestinal: Denies nausea, vomiting, abdominal pain, diarrhea, constipation, blood in stool and abdominal distention.  Genitourinary: Denies dysuria, urgency, frequency, hematuria, flank pain and difficulty urinating.  Endocrine: Denies: hot or cold intolerance, sweats, changes in hair or nails, polyuria, polydipsia. Musculoskeletal: Denies myalgias, back pain, joint swelling, arthralgias and gait problem.  Skin: Denies pallor, rash and wound.  Neurological: Denies dizziness, seizures, syncope, weakness, light-headedness, numbness and headaches.  Hematological: Denies adenopathy. Easy bruising, personal or family bleeding history  Psychiatric/Behavioral: Denies suicidal ideation, mood changes, confusion, nervousness, sleep disturbance and agitation   Assessment and Plan:  Chronic heart failure with preserved ejection fraction (HCC) Bilateral leg edema -Unable to physically assess her legs today as this is a phone visit.  She states that they are slightly better than when I last saw her in the office. -She does not like wearing  compression stockings but will make an effort.  We have agreed to continue Lasix even though it causes some episodes of incontinence. -She will follow-up as scheduled.   I discussed the assessment and treatment plan with the patient. The patient was provided an opportunity to ask questions and all were answered. The patient agreed with the plan and demonstrated an understanding of the instructions.   The patient was advised to call back or seek an in-person evaluation if the symptoms worsen or if the condition fails to improve as anticipated.  I provided 15 minutes of non-face-to-face time during this encounter.   Lelon Frohlich, MD [facility]

## 2019-01-05 ENCOUNTER — Other Ambulatory Visit: Payer: Self-pay

## 2019-01-05 ENCOUNTER — Ambulatory Visit
Admission: RE | Admit: 2019-01-05 | Discharge: 2019-01-05 | Disposition: A | Discharge status: Home / Self Care | Payer: Medicare HMO | Source: Ambulatory Visit | Attending: Obstetrics and Gynecology | Admitting: Obstetrics and Gynecology

## 2019-01-05 DIAGNOSIS — Z1231 Encounter for screening mammogram for malignant neoplasm of breast: Secondary | ICD-10-CM | POA: Diagnosis not present

## 2019-01-08 ENCOUNTER — Ambulatory Visit: Payer: Medicare HMO

## 2019-01-12 ENCOUNTER — Other Ambulatory Visit: Payer: Self-pay | Admitting: Internal Medicine

## 2019-01-31 ENCOUNTER — Other Ambulatory Visit: Payer: Self-pay

## 2019-01-31 ENCOUNTER — Encounter: Payer: Self-pay | Admitting: Family Medicine

## 2019-01-31 ENCOUNTER — Ambulatory Visit (INDEPENDENT_AMBULATORY_CARE_PROVIDER_SITE_OTHER): Payer: Medicare HMO | Admitting: Family Medicine

## 2019-01-31 DIAGNOSIS — B029 Zoster without complications: Secondary | ICD-10-CM | POA: Diagnosis not present

## 2019-01-31 MED ORDER — VALACYCLOVIR HCL 1 G PO TABS: 1000.0000 mg | ORAL_TABLET | Freq: Three times a day (TID) | ORAL | 0 refills | Status: DC

## 2019-01-31 NOTE — Progress Notes (Signed)
   Subjective:    Patient ID: [name], female    DOB: [birth year], 85 y.o.   MRN: [id number]  HPI Virtual Visit via Telephone Note  I connected with the patient on 01/31/19 at  3:45 PM EDT by telephone and verified that I am speaking with the correct person using two identifiers. We attempted to connect virtually but we had technical difficulties with the audio and video.     I discussed the limitations, risks, security and privacy concerns of performing an evaluation and management service by telephone and the availability of in person appointments. I also discussed with the patient that there may be a patient responsible charge related to this service. The patient expressed understanding and agreed to proceed.  Location patient: home Location provider: work or home office Participants present for the call: patient, provider Patient did not have a visit in the prior 7 days to address this/these issue(s).   History of Present Illness: Here for the onset this morning of red bumps on the right lower back and the right lateral hip. These are painful with a burning quality of pain. No other symptoms. She was told she had shingles once as a young child, but she does not remember that.   Observations/Objective: Patient sounds cheerful and well on the phone. I do not appreciate any SOB. Speech and thought processing are grossly intact. Patient reported vitals:  Assessment and Plan: Shingles, treat with Valtrex. Recheck prn.  Alysia Penna, MD   Follow Up Instructions:     [phone] [phone] 11-20 [phone] 21-30 I did not refer this patient for an OV in the next 24 hours for this/these issue(s).  I discussed the assessment and treatment plan with the patient. The patient was provided an opportunity to ask questions and all were answered. The patient agreed with the plan and demonstrated an understanding of the instructions.   The patient was advised to call back or seek an  in-person evaluation if the symptoms worsen or if the condition fails to improve as anticipated.  I provided 14 minutes of non-face-to-face time during this encounter.   Alysia Penna, MD    Review of Systems     Objective:   Physical Exam        Assessment & Plan:

## 2019-02-15 ENCOUNTER — Other Ambulatory Visit: Payer: Self-pay | Admitting: Family Medicine

## 2019-02-15 ENCOUNTER — Other Ambulatory Visit: Payer: Self-pay | Admitting: Internal Medicine

## 2019-02-15 NOTE — Telephone Encounter (Signed)
Patient is wanting a medication refill for the following medications :    valACYclovir (VALTREX) 1000 MG tablet [id number]    Pharmacy: [id number] [address], [place] [address]  Phone: [phone] Fax: [phone]      Patient was told she could not have this medications refilled please advise    Call back number [phone]

## 2019-02-26 ENCOUNTER — Other Ambulatory Visit: Payer: Self-pay | Admitting: Internal Medicine

## 2019-03-07 DIAGNOSIS — G518 Other disorders of facial nerve: Secondary | ICD-10-CM | POA: Diagnosis not present

## 2019-03-07 DIAGNOSIS — G43719 Chronic migraine without aura, intractable, without status migrainosus: Secondary | ICD-10-CM | POA: Diagnosis not present

## 2019-03-07 DIAGNOSIS — M791 Myalgia, unspecified site: Secondary | ICD-10-CM | POA: Diagnosis not present

## 2019-03-07 DIAGNOSIS — G43019 Migraine without aura, intractable, without status migrainosus: Secondary | ICD-10-CM | POA: Diagnosis not present

## 2019-03-07 DIAGNOSIS — M542 Cervicalgia: Secondary | ICD-10-CM | POA: Diagnosis not present

## 2019-03-16 ENCOUNTER — Other Ambulatory Visit: Payer: Self-pay

## 2019-03-16 ENCOUNTER — Emergency Department (HOSPITAL_COMMUNITY): Payer: Medicare HMO

## 2019-03-16 ENCOUNTER — Other Ambulatory Visit: Payer: Self-pay | Admitting: Internal Medicine

## 2019-03-16 ENCOUNTER — Emergency Department (HOSPITAL_COMMUNITY)
Admission: EM | Admit: 2019-03-16 | Discharge: 2019-03-16 | Disposition: A | Discharge status: Home / Self Care | Payer: Medicare HMO | Attending: Emergency Medicine | Admitting: Emergency Medicine

## 2019-03-16 ENCOUNTER — Encounter (HOSPITAL_COMMUNITY): Payer: Self-pay

## 2019-03-16 DIAGNOSIS — Y92013 Bedroom of single-family (private) house as the place of occurrence of the external cause: Secondary | ICD-10-CM | POA: Insufficient documentation

## 2019-03-16 DIAGNOSIS — W19XXXA Unspecified fall, initial encounter: Secondary | ICD-10-CM

## 2019-03-16 DIAGNOSIS — I129 Hypertensive chronic kidney disease with stage 1 through stage 4 chronic kidney disease, or unspecified chronic kidney disease: Secondary | ICD-10-CM | POA: Diagnosis not present

## 2019-03-16 DIAGNOSIS — R600 Localized edema: Secondary | ICD-10-CM

## 2019-03-16 DIAGNOSIS — S7001XA Contusion of right hip, initial encounter: Secondary | ICD-10-CM | POA: Diagnosis not present

## 2019-03-16 DIAGNOSIS — Y9301 Activity, walking, marching and hiking: Secondary | ICD-10-CM | POA: Diagnosis not present

## 2019-03-16 DIAGNOSIS — Z96651 Presence of right artificial knee joint: Secondary | ICD-10-CM | POA: Diagnosis not present

## 2019-03-16 DIAGNOSIS — N182 Chronic kidney disease, stage 2 (mild): Secondary | ICD-10-CM | POA: Diagnosis not present

## 2019-03-16 DIAGNOSIS — M25551 Pain in right hip: Secondary | ICD-10-CM | POA: Diagnosis not present

## 2019-03-16 DIAGNOSIS — I10 Essential (primary) hypertension: Secondary | ICD-10-CM | POA: Diagnosis not present

## 2019-03-16 DIAGNOSIS — W010XXA Fall on same level from slipping, tripping and stumbling without subsequent striking against object, initial encounter: Secondary | ICD-10-CM | POA: Insufficient documentation

## 2019-03-16 DIAGNOSIS — S79911A Unspecified injury of right hip, initial encounter: Secondary | ICD-10-CM | POA: Diagnosis not present

## 2019-03-16 DIAGNOSIS — Y998 Other external cause status: Secondary | ICD-10-CM | POA: Diagnosis not present

## 2019-03-16 DIAGNOSIS — Z79899 Other long term (current) drug therapy: Secondary | ICD-10-CM | POA: Diagnosis not present

## 2019-03-16 NOTE — ED Triage Notes (Signed)
Pt arrived via EMS c/o fall that happened 3-4 hours ago. Patient states that her R hip hurts when ambulating. EMS notes a small contusion on patients head, but patient denies LOC, headache or neck pain. Pt is not on any blood thinners. Patient has history of R sided kidney failure and wants to make sure she didn't injure her kidney. Pt told EMS that she has short term memory loss but was AOx4 with them. Hx: AFIB & HTN.

## 2019-03-16 NOTE — Discharge Instructions (Addendum)
Please follow-up with your family doctor and one week for recheck. Get rechecked immediately if you have severe pain in your hip or new concerning symptoms.

## 2019-03-16 NOTE — ED Provider Notes (Signed)
[facility] DEPT Provider Note   CSN: [id number] Arrival date & time: 03/16/19  1607     History   Chief Complaint No chief complaint on file.   HPI [name] is a 85 y.o. female.     The history is provided by the patient and medical records. No language interpreter was used.   [name] is a 85 y.o. female who presents to the Emergency Department complaining of fall. She presents the emergency department by EMS complaining of right hip pain following a fall that occurred earlier today. She states that she was in her bedroom wearing her slippers when she went to turn and she became off balance and fell to the side, striking her right hip on the ground. She states that she lightly hit the right side of her head on a bed frail on the way down. She denies any headache, loss of consciousness, chest pain, abdominal pain. She is currently recovering from shingles to her right flank and hip, otherwise no recent illnesses. Her neighbor heard her fall and called 911. Past Medical History:  Diagnosis Date  . Anxiety and depression   . Back pain    lumbar  . Fecal incontinence    Dr. Michail Sermon, likely due to loose stools in conjunction with pelvic muscle laxity from aging, recommended to stop Miralax  . GI (gastrointestinal bleed)    hx of colon polyps 2004, diverticulosis, internal hemmorhids  . Headache   . Headache(784.0)   . Hydronephrosis    Right 2008 s/p urology eval CT/cysto: observation/monitor  . Hyperlipidemia   . Hypertension   . Knee pain    right chronic  . Migraine    on topamax , per neuro  . Osteoporosis   . Pulmonary nodules    [place]. First seen by CT only 05/04/03, no change on f/u 08/08/06 -Macroscopic changes rml 04/27/07 > resolved april 6,2010 no further w/u   . Seborrheic keratosis    Left inferior posterior flank  . SVT (supraventricular tachycardia) ([place])    sees Dr.Ross    Patient Active Problem List    Diagnosis Date Noted  . (HFpEF) heart failure with preserved ejection fraction ([place]) 11/23/2018  . Edema of both lower extremities 04/18/2018  . Chronic migraine 11/15/2017  . Memory loss 11/15/2017  . Osteoporosis 07/09/2017  . CKD (chronic kidney disease) stage 2, GFR 60-89 ml/min 01/06/2016  . Abdominal pain 09/10/2015  . Pulmonary nodules/lesions, multiple 09/10/2015  . Small bowel obstruction, partial ([place]) 09/10/2015  . Arthritis 09/10/2015  . Partial small bowel obstruction ([place]) 09/10/2015  . PCP NOTES >>>>>> 04/17/2015  . Hemorrhoids 09/17/2011  . Annual physical exam 03/19/2011  . DJD -back pain-on Celebrex   . Cough 09/25/2010  . DOE (dyspnea on exertion) 08/28/2010  . Constipation 01/21/2010  . DIVERTICULOSIS OF COLON 09/02/2009  . PERSONAL HISTORY OF COLONIC POLYPS 09/02/2009  . FULL INCONTINENCE OF FECES 09/02/2009  . Essential hypertension 08/10/2007  . Headache 08/10/2007  . Pulmonary nodules 04/25/2007  . Anxiety state 02/03/2007  . Depression 02/03/2007  . KNEE PAIN, RIGHT, CHRONIC 02/03/2007  . Hyperlipidemia 04/26/2006  . DIZZINESS 04/26/2006  . SVT (supraventricular tachycardia) ([place]) 04/26/2006    Past Surgical History:  Procedure Laterality Date  . ABDOMINAL HYSTERECTOMY    . APPENDECTOMY    . BASAL CELL CARCINOMA EXCISION    . BLADDER REPAIR    . CATARACT EXTRACTION  2008   Dr.Digby  . CHOLECYSTECTOMY    .  lumbar reconstructive surgery  01/07/2009  . R ovary removed    . ROTATOR CUFF REPAIR  9/-2011  . SHOULDER SURGERY  12-2011   LEFT  . TONSILLECTOMY    . TOTAL KNEE ARTHROPLASTY     right  . trigger finger surgery     L thumb (2014), R ring finger 05-2013     OB History   No obstetric history on file.      Home Medications    Prior to Admission medications   Medication Sig Start Date End Date Taking? Authorizing Provider  acetaminophen (TYLENOL) 325 MG tablet Take 650 mg by mouth every 6 (six) hours as needed.   Yes [provider]  atorvastatin (LIPITOR) 40 MG tablet Take 1 tablet (40 mg total) by mouth at bedtime. 11/02/18  Yes Paz, Alda Berthold, MD  Bismuth Subsalicylate (PEPTO-BISMOL PO) Take 1 tablet by mouth daily as needed.    Yes [provider]  Ca Q000111Q (CALCIUM-FOLIC ACID PLUS D PO) Take by mouth daily.   Yes [provider]  celecoxib (CELEBREX) 200 MG capsule TAKE 1 CAPSULE (200 MG TOTAL) BY MOUTH DAILY AS NEEDED FOR MODERATE PAIN. 12/19/18  Yes Isaac Bliss, Rayford Halsted, MD  CRANBERRY PO Take 1 capsule by mouth daily.   Yes [provider]  cyclobenzaprine (FLEXERIL) 10 MG tablet Take 10 mg by mouth 3 (three) times daily as needed for muscle spasms.   Yes [provider]  dextromethorphan-guaiFENesin (MUCINEX DM) 30-600 MG 12hr tablet Take 1 tablet by mouth 2 (two) times daily as needed for cough.   Yes [provider]  escitalopram (LEXAPRO) 10 MG tablet TAKE 1 TABLET BY MOUTH EVERY DAY Patient taking differently: Take 10 mg by mouth daily.  11/29/18  Yes Isaac Bliss, Rayford Halsted, MD  famotidine-calcium carbonate-magnesium hydroxide [facility] COMPLETE) 10-800-165 MG chewable tablet Chew 1 tablet by mouth daily as needed.   Yes [provider]  furosemide (LASIX) 20 MG tablet TAKE 1 TABLET BY MOUTH EVERY DAY Patient taking differently: Take 20 mg by mouth daily.  01/16/19  Yes Isaac Bliss, Rayford Halsted, MD  gabapentin (NEURONTIN) 100 MG capsule TAKE 2-3 CAPSULES (200-300 MG TOTAL) BY MOUTH 3 (THREE) TIMES DAILY. 11/06/18  Yes Paz, Alda Berthold, MD  Liniments (BLUE-EMU SUPER STRENGTH) CREA Apply 1 application topically at bedtime as needed (dry skin).    Yes [provider]  loperamide (IMODIUM A-D) 2 MG tablet Take 2 mg by mouth 4 (four) times daily as needed for diarrhea or loose stools.   Yes [provider]  NON FORMULARY Take 2 capsules by mouth 2 (two) times daily. [facility]   Yes [provider]  olopatadine  (PATANOL) 0.1 % ophthalmic solution Place 1 drop into both eyes 2 (two) times daily. As directed 10/05/18  Yes [provider]  Phenylephrine-Acetaminophen (SINUS PRESSURE + PAIN) 5-325 MG TABS Take by mouth as directed.   Yes [provider]  Probiotic Product (PROBIOTIC DAILY PO) Take 1 capsule by mouth daily.    Yes [provider]  Pumpkin Seed-Soy Germ (AZO BLADDER CONTROL/GO-LESS) CAPS Take by mouth.   Yes [provider]  simethicone (GAS-X) 80 MG chewable tablet Chew 80 mg by mouth every 6 (six) hours as needed for flatulence.   Yes [provider]  Potassium Chloride ER 20 MEQ TBCR Take 1 tablet by mouth daily. 02/19/19   [provider]  valACYclovir (VALTREX) 1000 MG tablet TAKE 1 TABLET BY MOUTH THREE  TIMES A DAY Patient not taking: Reported on 03/16/2019 02/15/19   Laurey Morale, MD    Family History Family History  Problem Relation Age of Onset  . Breast cancer Mother        later in life  . Diabetes Mother        late in life  . Heart disease Mother        late onset  . Lung cancer Father        apparently had TB  . Heart disease Father        late onset  . Prostate cancer Father   . Colitis Brother   . Cancer Brother   . Colon cancer Neg Hx     Social History Social History   Tobacco Use  . Smoking status: Never Smoker  . Smokeless tobacco: Never Used  Substance Use Topics  . Alcohol use: Yes    Alcohol/week: 1.0 standard drinks    Types: 1 Glasses of wine per week    Comment: rarely, 1 glass of wine monthly  . Drug use: No     Allergies   Amitriptyline hcl, Carisoprodol, Codeine, Fosamax [alendronate sodium], Meloxicam, Metronidazole, Nortriptyline hcl, Oxaprozin, Promethazine hcl, Rofecoxib, Sumatriptan, Tramadol, and Venlafaxine   Review of Systems Review of Systems  All other systems reviewed and are negative.    Physical Exam Updated Vital Signs BP (!) 186/83 (BP Location: Left Arm)    Pulse 72   Temp 97.8 F (36.6 C) (Oral)   Resp 17   Ht 5\' 3"  (1.6 m)   Wt 68.9 kg   SpO2 97%   BMI 26.93 kg/m   Physical Exam Vitals signs and nursing note reviewed.  Constitutional:      Appearance: She is well-developed.  HENT:     Head: Normocephalic and atraumatic.  Neck:     Musculoskeletal: Neck supple.  Cardiovascular:     Rate and Rhythm: Normal rate and regular rhythm.     Heart sounds: No murmur.  Pulmonary:     Effort: Pulmonary effort is normal. No respiratory distress.     Breath sounds: Normal breath sounds.  Abdominal:     Palpations: Abdomen is soft.     Tenderness: There is no abdominal tenderness. There is no guarding or rebound.     Comments: No flank tenderness to palpation.  Musculoskeletal:     Comments: 2+ DP pulses bilaterally. There is mild tenderness to palpation over the right lateral hip without any overlying ecchymosis. Trace pitting edema to bilateral lower extremities, left greater than right  Skin:    General: Skin is warm and dry.  Neurological:     Mental Status: She is alert and oriented to person, place, and time.  Psychiatric:        Behavior: Behavior normal.      ED Treatments / Results  Labs (all labs ordered are listed, but only abnormal results are displayed) Labs Reviewed - No data to display  EKG None  Radiology Dg Hip Unilat W Or Wo Pelvis 2-3 Views Right  Result Date: 03/16/2019 CLINICAL DATA:  Pain after fall EXAM: DG HIP (WITH OR WITHOUT PELVIS) 2-3V RIGHT COMPARISON:  None. FINDINGS: Surgical hardware seen in the lower lumbar spine. No fractures or dislocations. IMPRESSION: No fractures or dislocations are identified. Electronically Signed   By: Dorise Bullion III M.D   On: 03/16/2019 17:37    Procedures Procedures (including critical care time)  Medications Ordered in ED Medications - No  data to display   Initial Impression / Assessment and Plan / ED Course  I have reviewed the triage vital signs and the  nursing notes.  Pertinent labs & imaging results that were available during my care of the patient were reviewed by me and considered in my medical decision making (see chart for details).        Patient here for evaluation of injuries following a mechanical fall. She complains of pain to her right hip. She is able to fully range the hip but is well perfused on examination. Imaging is negative for acute fracture. She is able to ambulate without difficulty in the emergency department. She states that overall her pain has improved since ED arrival. Presentation is not consistent with occult hip fracture, serious intra-abdominal or retroperitoneal injury, serious closed head injury. Counseled patient on home care for hip contusion as well as fall. Discussed outpatient follow-up and return precautions. Attempted to contact the patient's son as well, but there was no answer.  Final Clinical Impressions(s) / ED Diagnoses   Final diagnoses:  Fall, initial encounter  Contusion of right hip, initial encounter    ED Discharge Orders    None       Quintella Reichert, MD 03/16/19 [phone]

## 2019-03-21 ENCOUNTER — Telehealth: Payer: Self-pay

## 2019-03-21 NOTE — Telephone Encounter (Signed)
Copied from [place] [phone]. Topic: Appointment Scheduling - Prior Auth Required for Appointment >> Mar 20, 2019 11:12 AM Robina Ade, [name] wrote: No appointment has been scheduled. Patient is requesting hospital f/u appointment. Per scheduling protocol, this appointment requires a prior authorization prior to scheduling.  Route to department's PEC pool.

## 2019-03-21 NOTE — Telephone Encounter (Signed)
Copied from [place] [id number]. Topic: Appointment Scheduling - Prior Auth Required for Appointment >> Mar 20, 2019 11:12 AM Robina Ade, [name] wrote: No appointment has been scheduled. Patient is requesting hospital f/u appointment. Per scheduling protocol, this appointment requires a prior authorization prior to scheduling.  Route to department's PEC pool.

## 2019-03-23 NOTE — Telephone Encounter (Signed)
Virtual appointment has been scheduled. Nothing further needed.

## 2019-03-27 ENCOUNTER — Telehealth (INDEPENDENT_AMBULATORY_CARE_PROVIDER_SITE_OTHER): Payer: Medicare HMO | Admitting: Internal Medicine

## 2019-03-27 ENCOUNTER — Other Ambulatory Visit: Payer: Self-pay

## 2019-03-27 DIAGNOSIS — W19XXXD Unspecified fall, subsequent encounter: Secondary | ICD-10-CM

## 2019-03-27 DIAGNOSIS — R413 Other amnesia: Secondary | ICD-10-CM

## 2019-03-27 DIAGNOSIS — M25551 Pain in right hip: Secondary | ICD-10-CM

## 2019-03-27 NOTE — Progress Notes (Signed)
Virtual Visit via Telephone Note  I connected with [name] on 03/27/19 at  3:30 PM EST by telephone and verified that I am speaking with the correct person using two identifiers.   I discussed the limitations, risks, security and privacy concerns of performing an evaluation and management service by telephone and the availability of in person appointments. I also discussed with the patient that there may be a patient responsible charge related to this service. The patient expressed understanding and agreed to proceed.  Location patient: home Location provider: work office Participants present for the call: patient, provider Patient did not have a visit in the prior 7 days to address this/these issue(s).   History of Present Illness:  This appointment has been scheduled as an ED follow-up.  She was seen in the ED on 1127 after a fall earlier that day that resulted in right hip pain.  It was a mechanical fall, she believes it was due to her bedroom slippers.  She is no longer using these shoes as she has had another follow-up with them before.  In the ED she had an x-ray that was negative for fracture.  They did not believe she needed a CT or MRI to evaluate for occult fracture given her presentation and she was sent home.  She is already receiving physical therapy for balance.  She states her hip pain is much improved and she is now able to ambulate without difficulty.  She is very concerned about her short-term memory loss.  She was evaluated in August in this office for this reason.  Her Mini-Mental status exam score at that time was 29.  She was visited by her niece last weekend who also noticed significant issues.  She had a normal TSH and a low normal B12 level.  She does have a history of depression that is treated on Lexapro and appears to be stable.   Observations/Objective: Patient sounds cheerful and well on the phone. I do not appreciate any increased work of  breathing. Speech and thought processing are grossly intact. Patient reported vitals: None reported   Current Outpatient Medications:  .  acetaminophen (TYLENOL) 325 MG tablet, Take 650 mg by mouth every 6 (six) hours as needed., Disp: , Rfl:  .  atorvastatin (LIPITOR) 40 MG tablet, Take 1 tablet (40 mg total) by mouth at bedtime., Disp: 90 tablet, Rfl: 2 .  Bismuth Subsalicylate (PEPTO-BISMOL PO), Take 1 tablet by mouth daily as needed. , Disp: , Rfl:  .  Ca Q000111Q (CALCIUM-FOLIC ACID PLUS D PO), Take by mouth daily., Disp: , Rfl:  .  celecoxib (CELEBREX) 200 MG capsule, TAKE 1 CAPSULE (200 MG TOTAL) BY MOUTH DAILY AS NEEDED FOR MODERATE PAIN., Disp: 90 capsule, Rfl: 1 .  CRANBERRY PO, Take 1 capsule by mouth daily., Disp: , Rfl:  .  cyclobenzaprine (FLEXERIL) 10 MG tablet, Take 10 mg by mouth 3 (three) times daily as needed for muscle spasms., Disp: , Rfl:  .  dextromethorphan-guaiFENesin (MUCINEX DM) 30-600 MG 12hr tablet, Take 1 tablet by mouth 2 (two) times daily as needed for cough., Disp: , Rfl:  .  escitalopram (LEXAPRO) 10 MG tablet, TAKE 1 TABLET BY MOUTH EVERY DAY (Patient taking differently: Take 10 mg by mouth daily. ), Disp: 90 tablet, Rfl: 1 .  escitalopram (LEXAPRO) 5 MG tablet, TAKE 1 TABLET BY MOUTH EVERY DAY, Disp: 30 tablet, Rfl: 0 .  famotidine-calcium carbonate-magnesium hydroxide (PEPCID COMPLETE) 10-800-165 MG chewable tablet, Chew 1  tablet by mouth daily as needed., Disp: , Rfl:  .  furosemide (LASIX) 20 MG tablet, TAKE 1 TABLET BY MOUTH EVERY DAY (Patient taking differently: Take 20 mg by mouth daily. ), Disp: 30 tablet, Rfl: 0 .  gabapentin (NEURONTIN) 100 MG capsule, TAKE 2-3 CAPSULES (200-300 MG TOTAL) BY MOUTH 3 (THREE) TIMES DAILY., Disp: 810 capsule, Rfl: 1 .  Liniments (BLUE-EMU SUPER STRENGTH) CREA, Apply 1 application topically at bedtime as needed (dry skin). , Disp: , Rfl:  .  loperamide (IMODIUM A-D) 2 MG tablet, Take 2 mg by mouth 4 (four)  times daily as needed for diarrhea or loose stools., Disp: , Rfl:  .  NON FORMULARY, Take 2 capsules by mouth 2 (two) times daily. [employer]	[place], Disp: , Rfl:  .  olopatadine (PATANOL) 0.1 % ophthalmic solution, Place 1 drop into both eyes 2 (two) times daily. As directed, Disp: , Rfl:  .  Phenylephrine-Acetaminophen (SINUS PRESSURE + PAIN) 5-325 MG TABS, Take by mouth as directed., Disp: , Rfl:  .  Potassium Chloride ER 20 MEQ TBCR, Take 1 tablet by mouth daily., Disp: , Rfl:  .  Probiotic Product (PROBIOTIC DAILY PO), Take 1 capsule by mouth daily. , Disp: , Rfl:  .  Pumpkin Seed-Soy Germ (AZO BLADDER CONTROL/GO-LESS) CAPS, Take by mouth., Disp: , Rfl:  .  simethicone (GAS-X) 80 MG chewable tablet, Chew 80 mg by mouth every 6 (six) hours as needed for flatulence., Disp: , Rfl:  .  valACYclovir (VALTREX) 1000 MG tablet, TAKE 1 TABLET BY MOUTH THREE TIMES A DAY (Patient not taking: Reported on 03/16/2019), Disp: 30 tablet, Rfl: 0  Review of Systems:  Constitutional: Denies fever, chills, diaphoresis, appetite change and fatigue.  HEENT: Denies photophobia, eye pain, redness, hearing loss, ear pain, congestion, sore throat, rhinorrhea, sneezing, mouth sores, trouble swallowing, neck pain, neck stiffness and tinnitus.   Respiratory: Denies SOB, DOE, cough, chest tightness,  and wheezing.   Cardiovascular: Denies chest pain, palpitations and leg swelling.  Gastrointestinal: Denies nausea, vomiting, abdominal pain, diarrhea, constipation, blood in stool and abdominal distention.  Genitourinary: Denies dysuria, urgency, frequency, hematuria, flank pain and difficulty urinating.  Endocrine: Denies: hot or cold intolerance, sweats, changes in hair or nails, polyuria, polydipsia. Musculoskeletal: Denies myalgias, back pain, joint swelling, arthralgias and gait problem.  Skin: Denies pallor, rash and wound.  Neurological: Denies dizziness, seizures, syncope, weakness, light-headedness, numbness and  headaches.  Hematological: Denies adenopathy. Easy bruising, personal or family bleeding history  Psychiatric/Behavioral: Denies suicidal ideation, mood changes, confusion, nervousness, sleep disturbance and agitation   Assessment and Plan:  Fall, subsequent encounter Right hip pain -Hip pain continues to improve, hardly any issues ambulating at this point. -Do not think we need to evaluate for occult hip fracture with CT or MRI at this point.  Memory loss - MMSE - Mini Mental State Exam 11/23/2018 12/06/2017 06/03/2016  Orientation to time 4 5 5   Orientation to Place 5 5 5   Registration 3 3 3   Attention/ Calculation 5 5 5   Recall 3 3 3   Language- name 2 objects 2 2 2   Language- repeat 1 1 1   Language- follow 3 step command 3 3 3   Language- read & follow direction 1 1 1   Write a sentence 1 1 1   Copy design 1 1 1   Total score 29 30 30    -Last 2 MMSE's have been 29 and 30 showing normal cognition. -Vitamin B12 level was 256 in August, TSH was normal at 2.01 in  June. -Family members and patients continue to describe significant short-term memory deficit.  Refer to neurology today.    I discussed the assessment and treatment plan with the patient. The patient was provided an opportunity to ask questions and all were answered. The patient agreed with the plan and demonstrated an understanding of the instructions.   The patient was advised to call back or seek an in-person evaluation if the symptoms worsen or if the condition fails to improve as anticipated.  I provided 22 minutes of non-face-to-face time during this encounter.   Lelon Frohlich, MD [facility]

## 2019-03-29 ENCOUNTER — Encounter: Payer: Self-pay | Admitting: Neurology

## 2019-04-04 ENCOUNTER — Telehealth: Payer: Self-pay | Admitting: *Deleted

## 2019-04-04 NOTE — Telephone Encounter (Signed)
Copied from [place] [id number]. Topic: Appointment Scheduling - Prior Auth Required for Appointment >> Mar 20, 2019 11:12 AM Robina Ade, [name] wrote: No appointment has been scheduled. Patient is requesting hospital f/u appointment. Per scheduling protocol, this appointment requires a prior authorization prior to scheduling.  Route to department's PEC pool.

## 2019-04-04 NOTE — Telephone Encounter (Signed)
Copied from [place] [phone]. Topic: Appointment Scheduling - Prior Auth Required for Appointment >> Mar 20, 2019 11:12 AM [name], [name] wrote: No appointment has been scheduled. Patient is requesting hospital f/u appointment. Per scheduling protocol, this appointment requires a prior authorization prior to scheduling.  Route to department's PEC pool.

## 2019-04-04 NOTE — Telephone Encounter (Signed)
Copied from [place] [id number]. Topic: Appointment Scheduling - Prior Auth Required for Appointment >> Mar 20, 2019 11:12 AM Robina Ade, [name] wrote: No appointment has been scheduled. Patient is requesting hospital f/u appointment. Per scheduling protocol, this appointment requires a prior authorization prior to scheduling.  Route to department's PEC pool.

## 2019-04-04 NOTE — Telephone Encounter (Signed)
Copied from [place] [phone]. Topic: Appointment Scheduling - Prior Auth Required for Appointment >> Mar 20, 2019 11:12 AM Robina Ade, [name] wrote: No appointment has been scheduled. Patient is requesting hospital f/u appointment. Per scheduling protocol, this appointment requires a prior authorization prior to scheduling.  Route to department's PEC pool.

## 2019-04-04 NOTE — Telephone Encounter (Signed)
Copied from [place] [id number]. Topic: Appointment Scheduling - Prior Auth Required for Appointment >> Mar 20, 2019 11:12 AM Robina Ade, [name] wrote: No appointment has been scheduled. Patient is requesting hospital f/u appointment. Per scheduling protocol, this appointment requires a prior authorization prior to scheduling.  Route to department's PEC pool.

## 2019-04-09 ENCOUNTER — Telehealth: Payer: Self-pay | Admitting: Internal Medicine

## 2019-04-09 NOTE — Telephone Encounter (Signed)
Pt wants to know if she is still supposed to be on a medication called Cartia.

## 2019-04-10 NOTE — Telephone Encounter (Signed)
FYI - Spoke with patient and she is aware of Dr Ledell Noss recommendation.  Patient also informed me that she had a positive COVID test 2 days ago.  She is not complaining of any symptoms and is in self isolation.

## 2019-04-10 NOTE — Telephone Encounter (Signed)
I do not see that med on her med list, so probably not.  [place]

## 2019-04-11 ENCOUNTER — Other Ambulatory Visit: Payer: Self-pay | Admitting: Internal Medicine

## 2019-04-11 MED ORDER — ESCITALOPRAM OXALATE 10 MG PO TABS: 10.0000 mg | ORAL_TABLET | Freq: Every day | ORAL | 1 refills | Status: DC

## 2019-04-23 ENCOUNTER — Ambulatory Visit: Payer: Self-pay | Admitting: *Deleted

## 2019-04-23 NOTE — Telephone Encounter (Signed)
  Reason for Disposition . Health Information question, no triage required and triager able to answer question  Answer Assessment - Initial Assessment Questions 1. REASON FOR CALL or QUESTION: "What is your reason for calling today?" or "How can I best help you?" or "What question do you have that I can help answer?"     I tested positive for COVID-19 3 weeks ago.   I live in [facility].    I need help moving some boxes but my son won't help me unless I get tested again.    Do I need to be tested again?  It's been 3 weeks ago and I never had symptoms.   I feel fine.  Protocols used: INFORMATION ONLY CALL - NO TRIAGE-A-AH  I let her know she did not need to be retested because after the 10 day quarantine she is no longer contagious.    She thanked me very much for giving her  the correct information.  I will let my son know that.

## 2019-04-23 NOTE — Telephone Encounter (Signed)
Pt lives in assisted living. She wants to know if she needs to be tested again. She was tested when she first went into assisted living and was negative. She is not having any symptoms. She needs some help with moving boxes and her family will not help her unless she gets tested again and she would like advice to know if this is the right thing to do. Please call

## 2019-04-29 NOTE — Progress Notes (Signed)
Cardiology Office Note:    Date:  04/30/2019   ID:  [name], DOB [birth year], MRN [id number]  PCP:  Isaac Bliss, Rayford Halsted, MD  Cardiologist:  Shirlee More, MD    Referring MD: Isaac Bliss, [place]*    ASSESSMENT:    1. SVT (supraventricular tachycardia) ([place])   2. Essential hypertension   3. Hyperlipidemia, unspecified hyperlipidemia type    PLAN:    In order of problems listed above:  1. Stable at this time we will not put her on suppressant medication 2. Poorly controlled add calcium channel blocker may need another agent such as an ARB recheck renal function before along with potassium 3. And his statin check liver function lipid profile   Next appointment: Next months   Medication Adjustments/Labs and Tests Ordered: Current medicines are reviewed at length with the patient today.  Concerns regarding medicines are outlined above.  No orders of the defined types were placed in this encounter.  No orders of the defined types were placed in this encounter.   Chief Complaint  Patient presents with  . Hypertension    History of Present Illness:    [name] is a 86 y.o. female with a hx of SVT and hypertension last seen 05/12/2018. Compliance with diet, lifestyle and medications: Unsure she has trouble with memory she has had falls and her son is with her and will get involved with her medications.  Previously she took a calcium channel blocker no longer is taking it.  She is significantly hypertensive with blood pressure in the range of 180/90 on my 2 repeats and I will place her on Norvasc 5 mg daily check home blood pressures contact are greater than 1 2:40 weeks and at that time I put her on a low-dose of an ARB.  Her last lab work had hypokalemia recheck renal function and serum potassium.  She has had no rapid heart rhythms has trouble with memory I am not can place her on a beta-blocker no chest pain edema or syncope  An emergency  room visit 03/16/2019 after a fall and subsequent hip pain.  Her other concern was memory loss. Past Medical History:  Diagnosis Date  . Anxiety and depression   . Back pain    lumbar  . Fecal incontinence    Dr. Michail Sermon, likely due to loose stools in conjunction with pelvic muscle laxity from aging, recommended to stop Miralax  . GI (gastrointestinal bleed)    hx of colon polyps 2004, diverticulosis, internal hemmorhids  . Headache   . Headache(784.0)   . Hydronephrosis    Right 2008 s/p urology eval CT/cysto: observation/monitor  . Hyperlipidemia   . Hypertension   . Knee pain    right chronic  . Migraine    on topamax , per neuro  . Osteoporosis   . Pulmonary nodules    [place]. First seen by CT only 05/04/03, no change on f/u 08/08/06 -Macroscopic changes rml 04/27/07 > resolved april 6,2010 no further w/u   . Seborrheic keratosis    Left inferior posterior flank  . SVT (supraventricular tachycardia) (HCC)    sees Dr.Ross    Past Surgical History:  Procedure Laterality Date  . ABDOMINAL HYSTERECTOMY    . APPENDECTOMY    . BASAL CELL CARCINOMA EXCISION    . BLADDER REPAIR    . CATARACT EXTRACTION  2008   Dr.Digby  . CHOLECYSTECTOMY    . lumbar reconstructive surgery  01/07/2009  . R ovary removed    .  ROTATOR CUFF REPAIR  9/-2011  . SHOULDER SURGERY  12-2011   LEFT  . TONSILLECTOMY    . TOTAL KNEE ARTHROPLASTY     right  . trigger finger surgery     L thumb (2014), R ring finger 05-2013    Current Medications: Current Meds  Medication Sig  . acetaminophen (TYLENOL) 325 MG tablet Take 650 mg by mouth every 6 (six) hours as needed.  Marland Kitchen atorvastatin (LIPITOR) 40 MG tablet Take 1 tablet (40 mg total) by mouth at bedtime.  . Bismuth Subsalicylate (PEPTO-BISMOL PO) Take 1 tablet by mouth daily as needed.   . Ca Q000111Q (CALCIUM-FOLIC ACID PLUS D PO) Take by mouth daily.  . celecoxib (CELEBREX) 200 MG capsule TAKE 1 CAPSULE (200 MG TOTAL) BY MOUTH DAILY AS  NEEDED FOR MODERATE PAIN.  Marland Kitchen CRANBERRY PO Take 1 capsule by mouth daily.  . cyclobenzaprine (FLEXERIL) 10 MG tablet Take 10 mg by mouth 3 (three) times daily as needed for muscle spasms.  Marland Kitchen dextromethorphan-guaiFENesin (MUCINEX DM) 30-600 MG 12hr tablet Take 1 tablet by mouth 2 (two) times daily as needed for cough.  . escitalopram (LEXAPRO) 10 MG tablet Take 1 tablet (10 mg total) by mouth daily.  . famotidine-calcium carbonate-magnesium hydroxide (PEPCID COMPLETE) 10-800-165 MG chewable tablet Chew 1 tablet by mouth daily as needed.  . gabapentin (NEURONTIN) 100 MG capsule TAKE 2-3 CAPSULES (200-300 MG TOTAL) BY MOUTH 3 (THREE) TIMES DAILY.  Marland Kitchen Liniments (BLUE-EMU SUPER STRENGTH) CREA Apply 1 application topically at bedtime as needed (dry skin).   Marland Kitchen loperamide (IMODIUM A-D) 2 MG tablet Take 2 mg by mouth 4 (four) times daily as needed for diarrhea or loose stools.  . NON FORMULARY Take 2 capsules by mouth 2 (two) times daily. [employer]  . olopatadine (PATANOL) 0.1 % ophthalmic solution Place 1 drop into both eyes 2 (two) times daily. As directed  . Phenylephrine-Acetaminophen (SINUS PRESSURE + PAIN) 5-325 MG TABS Take by mouth as directed.  . Potassium Chloride ER 20 MEQ TBCR Take 1 tablet by mouth daily.  . Probiotic Product (PROBIOTIC DAILY PO) Take 1 capsule by mouth daily.   . Pumpkin Seed-Soy Germ (AZO BLADDER CONTROL/GO-LESS) CAPS Take by mouth.  . simethicone (GAS-X) 80 MG chewable tablet Chew 80 mg by mouth every 6 (six) hours as needed for flatulence.  . [DISCONTINUED] furosemide (LASIX) 20 MG tablet TAKE 1 TABLET BY MOUTH EVERY DAY (Patient taking differently: Take 20 mg by mouth daily. )     Allergies:   Amitriptyline hcl, Carisoprodol, Codeine, Fosamax [alendronate sodium], Meloxicam, Metronidazole, Nortriptyline hcl, Oxaprozin, Promethazine hcl, Propranolol, Rofecoxib, Sumatriptan, Tramadol, and Venlafaxine   Social History   Socioeconomic History  . Marital status: Widowed     Spouse name: Not on file  . Number of children: 1  . Years of education: college   . Highest education level: Doctorate  Occupational History  . Occupation: retired   Tobacco Use  . Smoking status: Never Smoker  . Smokeless tobacco: Never Used  Substance and Sexual Activity  . Alcohol use: Yes    Alcohol/week: 1.0 standard drinks    Types: 1 Glasses of wine per week    Comment: rarely, 1 glass of wine monthly  . Drug use: No  . Sexual activity: Not Currently  Other Topics Concern  . Not on file  Social History Narrative   Moved from a retirement home to a condominium 04-2013, independent living,  lives by herself , drives    Son  lives in [place]   Right handed    1 cup of caffeine per day    Social Determinants of Health   Financial Resource Strain:   . Difficulty of Paying Living Expenses: Not on file  Food Insecurity:   . Worried About Charity fundraiser in the Last Year: Not on file  . Ran Out of Food in the Last Year: Not on file  Transportation Needs:   . Lack of Transportation (Medical): Not on file  . Lack of Transportation (Non-Medical): Not on file  Physical Activity:   . Days of Exercise per Week: Not on file  . Minutes of Exercise per Session: Not on file  Stress:   . Feeling of Stress : Not on file  Social Connections:   . Frequency of Communication with Friends and Family: Not on file  . Frequency of Social Gatherings with Friends and Family: Not on file  . Attends Religious Services: Not on file  . Active Member of Clubs or Organizations: Not on file  . Attends Archivist Meetings: Not on file  . Marital Status: Not on file     Family History: The patient's family history includes Breast cancer in her mother; Cancer in her brother; Colitis in her brother; Diabetes in her mother; Heart disease in her father and mother; Lung cancer in her father; Prostate cancer in her father. There is no history of Colon cancer. ROS:   Please see the history  of present illness.    All other systems reviewed and are negative.  EKGs/Labs/Other Studies Reviewed:    The following studies were reviewed today:    Recent Labs: 10/17/2018: Hemoglobin 12.1; Platelets 332.0; Pro B Natriuretic peptide (BNP) 55.0; TSH 2.01 11/23/2018: ALT 7; BUN 16; Creatinine, Ser 0.95; Potassium 3.4; Sodium 141  Recent Lipid Panel    Component Value Date/Time   CHOL 136 01/13/2018 1048   TRIG 141.0 01/13/2018 1048   HDL 54.80 01/13/2018 1048   CHOLHDL 2 01/13/2018 1048   VLDL 28.2 01/13/2018 1048   LDLCALC 53 01/13/2018 1048   LDLDIRECT 78.0 12/20/2012 1148    Physical Exam:    VS:  BP (!) 172/94 (BP Location: Left Arm, Patient Position: Sitting, Cuff Size: Normal)   Pulse 76   Ht 5\' 3"  (1.6 m)   Wt 150 lb (68 kg)   SpO2 97%   BMI 26.57 kg/m     Wt Readings from Last 3 Encounters:  04/30/19 150 lb (68 kg)  03/16/19 152 lb (68.9 kg)  12/07/18 160 lb 14.4 oz (73 kg)     GEN:  Well nourished, well developed in no acute distress HEENT: Normal NECK: No JVD; No carotid bruits LYMPHATICS: No lymphadenopathy CARDIAC: RRR, no murmurs, rubs, gallops RESPIRATORY:  Clear to auscultation without rales, wheezing or rhonchi  ABDOMEN: Soft, non-tender, non-distended MUSCULOSKELETAL:  No edema; No deformity  SKIN: Warm and dry NEUROLOGIC:  Alert and oriented x 3 PSYCHIATRIC:  Normal affect    Signed, Shirlee More, MD  04/30/2019 10:54 AM    [place]

## 2019-04-30 ENCOUNTER — Encounter: Payer: Self-pay | Admitting: Cardiology

## 2019-04-30 ENCOUNTER — Telehealth: Payer: Self-pay | Admitting: Cardiology

## 2019-04-30 ENCOUNTER — Other Ambulatory Visit: Payer: Self-pay

## 2019-04-30 ENCOUNTER — Ambulatory Visit (INDEPENDENT_AMBULATORY_CARE_PROVIDER_SITE_OTHER): Payer: Medicare HMO | Admitting: Cardiology

## 2019-04-30 ENCOUNTER — Other Ambulatory Visit: Payer: Self-pay | Admitting: *Deleted

## 2019-04-30 VITALS — BP 172/94 | HR 76 | Ht 63.0 in | Wt 150.0 lb

## 2019-04-30 DIAGNOSIS — E785 Hyperlipidemia, unspecified: Secondary | ICD-10-CM | POA: Diagnosis not present

## 2019-04-30 DIAGNOSIS — I471 Supraventricular tachycardia: Secondary | ICD-10-CM | POA: Diagnosis not present

## 2019-04-30 DIAGNOSIS — I10 Essential (primary) hypertension: Secondary | ICD-10-CM | POA: Diagnosis not present

## 2019-04-30 MED ORDER — AMLODIPINE BESYLATE 5 MG PO TABS: 5.0000 mg | ORAL_TABLET | Freq: Every day | ORAL | 3 refills | Status: DC

## 2019-04-30 MED ORDER — AMLODIPINE BESYLATE 5 MG PO TABS: 5.0000 mg | ORAL_TABLET | Freq: Every day | ORAL | 1 refills | Status: DC

## 2019-04-30 NOTE — Patient Instructions (Signed)
Medication Instructions:  Start: Norvasc 5 MG Daily   *If you need a refill on your cardiac medications before your next appointment, please call your pharmacy*  Lab Work: CMP  Lipids  If you have labs (blood work) drawn today and your tests are completely normal, you will receive your results only by: Marland Kitchen MyChart Message (if you have MyChart) OR . A paper copy in the mail If you have any lab test that is abnormal or we need to change your treatment, we will call you to review the results.  Testing/Procedures: None  Follow-Up: At [facility], you and your health needs are our priority.  As part of our continuing mission to provide you with exceptional heart care, we have created designated Provider Care Teams.  These Care Teams include your primary Cardiologist (physician) and Advanced Practice Providers (APPs -  Physician Assistants and Nurse Practitioners) who all work together to provide you with the care you need, when you need it.  Your next appointment:   3 month(s)  The format for your next appointment:   In Person  Provider:   Shirlee More, MD  Other Instructions None

## 2019-04-30 NOTE — Telephone Encounter (Signed)
Refill sent.

## 2019-04-30 NOTE — Telephone Encounter (Signed)
Patient is at the pharmacy and needs the Norvasc called to the pharmacy.Marland Kitchenthey are there waiting!

## 2019-04-30 NOTE — Addendum Note (Signed)
Addended by: Margot Ables on: 04/30/2019 11:12 AM   Modules accepted: Orders

## 2019-05-01 ENCOUNTER — Telehealth: Payer: Self-pay | Admitting: *Deleted

## 2019-05-01 LAB — COMPREHENSIVE METABOLIC PANEL
ALT: 6 IU/L (ref 0–32)
AST: 16 IU/L (ref 0–40)
Albumin/Globulin Ratio: 2.4 — ABNORMAL HIGH (ref 1.2–2.2)
Albumin: 4.5 g/dL (ref 3.6–4.6)
Alkaline Phosphatase: 108 IU/L (ref 39–117)
BUN/Creatinine Ratio: 16 (ref 12–28)
BUN: 14 mg/dL (ref 8–27)
Bilirubin Total: 0.5 mg/dL (ref 0.0–1.2)
CO2: 23 mmol/L (ref 20–29)
Calcium: 9.7 mg/dL (ref 8.7–10.3)
Chloride: 103 mmol/L (ref 96–106)
Creatinine, Ser: 0.86 mg/dL (ref 0.57–1.00)
GFR calc Af Amer: 70 mL/min/{1.73_m2} (ref >59)
GFR calc non Af Amer: 61 mL/min/{1.73_m2} (ref >59)
Globulin, Total: 1.9 g/dL (ref 1.5–4.5)
Glucose: 98 mg/dL (ref 65–99)
Potassium: 4.3 mmol/L (ref 3.5–5.2)
Sodium: 141 mmol/L (ref 134–144)
Total Protein: 6.4 g/dL (ref 6.0–8.5)

## 2019-05-01 LAB — LIPID PANEL
Chol/HDL Ratio: 3 ratio (ref 0.0–4.4)
Cholesterol, Total: 146 mg/dL (ref 100–199)
HDL: 48 mg/dL (ref >39)
LDL Chol Calc (NIH): 72 mg/dL (ref 0–99)
Triglycerides: 148 mg/dL (ref 0–149)
VLDL Cholesterol Cal: 26 mg/dL (ref 5–40)

## 2019-05-01 NOTE — Telephone Encounter (Signed)
Telephone call to patient. Left message that labs were normal and to call with any questions. 

## 2019-05-01 NOTE — Telephone Encounter (Signed)
-----   Message from Richardo Priest, MD sent at 05/01/2019  8:10 AM EST ----- Normal or stable result  No change in treatment

## 2019-05-04 ENCOUNTER — Other Ambulatory Visit: Payer: Self-pay | Admitting: *Deleted

## 2019-05-04 ENCOUNTER — Other Ambulatory Visit: Payer: Self-pay

## 2019-05-04 DIAGNOSIS — I471 Supraventricular tachycardia, unspecified: Secondary | ICD-10-CM

## 2019-05-04 DIAGNOSIS — E785 Hyperlipidemia, unspecified: Secondary | ICD-10-CM

## 2019-05-04 DIAGNOSIS — I10 Essential (primary) hypertension: Secondary | ICD-10-CM

## 2019-05-04 MED ORDER — AMLODIPINE BESYLATE 5 MG PO TABS: 5.0000 mg | ORAL_TABLET | Freq: Every day | ORAL | 3 refills | Status: DC

## 2019-05-04 MED ORDER — AMLODIPINE BESYLATE 5 MG PO TABS: 5.0000 mg | ORAL_TABLET | Freq: Every day | ORAL | 1 refills | Status: DC

## 2019-05-04 NOTE — Telephone Encounter (Signed)
Pt called our office today needing refills for NORVASC 5 MG QD. Pt says she can't find it at the pharmacy, she saw  [facility] on 04/30/19. Please address.

## 2019-05-04 NOTE — Addendum Note (Signed)
Addended by: Gar Ponto on: 05/04/2019 02:16 PM   Modules accepted: Orders

## 2019-05-07 MED ORDER — AMLODIPINE BESYLATE 5 MG PO TABS: 5.0000 mg | ORAL_TABLET | Freq: Every day | ORAL | 1 refills | Status: DC

## 2019-05-07 NOTE — Addendum Note (Signed)
Addended by: Beckey Rutter on: 05/07/2019 04:43 PM   Modules accepted: Orders

## 2019-05-07 NOTE — Addendum Note (Signed)
Addended by: Beckey Rutter on: 05/07/2019 04:40 PM   Modules accepted: Orders

## 2019-05-08 ENCOUNTER — Other Ambulatory Visit: Payer: Self-pay | Admitting: *Deleted

## 2019-05-08 DIAGNOSIS — E785 Hyperlipidemia, unspecified: Secondary | ICD-10-CM

## 2019-05-08 DIAGNOSIS — I10 Essential (primary) hypertension: Secondary | ICD-10-CM

## 2019-05-08 DIAGNOSIS — I471 Supraventricular tachycardia: Secondary | ICD-10-CM

## 2019-05-08 MED ORDER — AMLODIPINE BESYLATE 5 MG PO TABS: 5.0000 mg | ORAL_TABLET | Freq: Every day | ORAL | 1 refills | Status: DC

## 2019-05-08 NOTE — Telephone Encounter (Signed)
Called [facility] on [address] and verbally refilled prescription. Called patient and let her know they are working on it now.

## 2019-05-08 NOTE — Telephone Encounter (Signed)
Follow Up  Pt called and stated that she still doesn't have her medication that Dr. Bettina Gavia sent. She said she checked [facility] but they did not have it in. She says that she really needs her medication.  Please call to discuss or confirm

## 2019-05-10 DIAGNOSIS — R41841 Cognitive communication deficit: Secondary | ICD-10-CM | POA: Diagnosis not present

## 2019-05-11 DIAGNOSIS — M6281 Muscle weakness (generalized): Secondary | ICD-10-CM | POA: Diagnosis not present

## 2019-05-11 DIAGNOSIS — R41841 Cognitive communication deficit: Secondary | ICD-10-CM | POA: Diagnosis not present

## 2019-05-11 DIAGNOSIS — R2681 Unsteadiness on feet: Secondary | ICD-10-CM | POA: Diagnosis not present

## 2019-05-14 DIAGNOSIS — M6281 Muscle weakness (generalized): Secondary | ICD-10-CM | POA: Diagnosis not present

## 2019-05-14 DIAGNOSIS — R2681 Unsteadiness on feet: Secondary | ICD-10-CM | POA: Diagnosis not present

## 2019-05-16 ENCOUNTER — Other Ambulatory Visit: Payer: Self-pay | Admitting: Internal Medicine

## 2019-05-17 ENCOUNTER — Other Ambulatory Visit: Payer: Self-pay | Admitting: *Deleted

## 2019-05-17 DIAGNOSIS — R41841 Cognitive communication deficit: Secondary | ICD-10-CM | POA: Diagnosis not present

## 2019-05-17 DIAGNOSIS — R2681 Unsteadiness on feet: Secondary | ICD-10-CM | POA: Diagnosis not present

## 2019-05-17 DIAGNOSIS — M6281 Muscle weakness (generalized): Secondary | ICD-10-CM | POA: Diagnosis not present

## 2019-05-17 MED ORDER — POTASSIUM CHLORIDE ER 20 MEQ PO TBCR: 1.0000 | EXTENDED_RELEASE_TABLET | Freq: Every day | ORAL | 1 refills | Status: DC

## 2019-05-18 DIAGNOSIS — R41841 Cognitive communication deficit: Secondary | ICD-10-CM | POA: Diagnosis not present

## 2019-05-18 DIAGNOSIS — M6281 Muscle weakness (generalized): Secondary | ICD-10-CM | POA: Diagnosis not present

## 2019-05-18 DIAGNOSIS — R2681 Unsteadiness on feet: Secondary | ICD-10-CM | POA: Diagnosis not present

## 2019-05-21 ENCOUNTER — Other Ambulatory Visit: Payer: Self-pay | Admitting: Internal Medicine

## 2019-05-21 DIAGNOSIS — R41841 Cognitive communication deficit: Secondary | ICD-10-CM | POA: Diagnosis not present

## 2019-05-21 DIAGNOSIS — M6281 Muscle weakness (generalized): Secondary | ICD-10-CM | POA: Diagnosis not present

## 2019-05-21 DIAGNOSIS — R2681 Unsteadiness on feet: Secondary | ICD-10-CM | POA: Diagnosis not present

## 2019-05-22 DIAGNOSIS — M6281 Muscle weakness (generalized): Secondary | ICD-10-CM | POA: Diagnosis not present

## 2019-05-22 DIAGNOSIS — R2681 Unsteadiness on feet: Secondary | ICD-10-CM | POA: Diagnosis not present

## 2019-05-22 DIAGNOSIS — R41841 Cognitive communication deficit: Secondary | ICD-10-CM | POA: Diagnosis not present

## 2019-05-23 ENCOUNTER — Encounter: Payer: Self-pay | Admitting: Neurology

## 2019-05-23 DIAGNOSIS — R2681 Unsteadiness on feet: Secondary | ICD-10-CM | POA: Diagnosis not present

## 2019-05-23 DIAGNOSIS — M6281 Muscle weakness (generalized): Secondary | ICD-10-CM | POA: Diagnosis not present

## 2019-05-24 ENCOUNTER — Telehealth: Payer: Medicare HMO | Admitting: Neurology

## 2019-05-24 ENCOUNTER — Other Ambulatory Visit: Payer: Self-pay | Admitting: Internal Medicine

## 2019-05-24 DIAGNOSIS — H02401 Unspecified ptosis of right eyelid: Secondary | ICD-10-CM | POA: Diagnosis not present

## 2019-05-24 DIAGNOSIS — M6281 Muscle weakness (generalized): Secondary | ICD-10-CM | POA: Diagnosis not present

## 2019-05-24 DIAGNOSIS — H04123 Dry eye syndrome of bilateral lacrimal glands: Secondary | ICD-10-CM | POA: Diagnosis not present

## 2019-05-24 DIAGNOSIS — H10413 Chronic giant papillary conjunctivitis, bilateral: Secondary | ICD-10-CM | POA: Diagnosis not present

## 2019-05-24 DIAGNOSIS — R2681 Unsteadiness on feet: Secondary | ICD-10-CM | POA: Diagnosis not present

## 2019-05-24 DIAGNOSIS — Z961 Presence of intraocular lens: Secondary | ICD-10-CM | POA: Diagnosis not present

## 2019-05-25 DIAGNOSIS — R41841 Cognitive communication deficit: Secondary | ICD-10-CM | POA: Diagnosis not present

## 2019-05-31 DIAGNOSIS — M79672 Pain in left foot: Secondary | ICD-10-CM | POA: Diagnosis not present

## 2019-05-31 DIAGNOSIS — M79671 Pain in right foot: Secondary | ICD-10-CM | POA: Diagnosis not present

## 2019-06-04 DIAGNOSIS — R2681 Unsteadiness on feet: Secondary | ICD-10-CM | POA: Diagnosis not present

## 2019-06-04 DIAGNOSIS — M6281 Muscle weakness (generalized): Secondary | ICD-10-CM | POA: Diagnosis not present

## 2019-06-05 DIAGNOSIS — R41841 Cognitive communication deficit: Secondary | ICD-10-CM | POA: Diagnosis not present

## 2019-06-05 DIAGNOSIS — M6281 Muscle weakness (generalized): Secondary | ICD-10-CM | POA: Diagnosis not present

## 2019-06-05 DIAGNOSIS — R2681 Unsteadiness on feet: Secondary | ICD-10-CM | POA: Diagnosis not present

## 2019-06-06 DIAGNOSIS — M6281 Muscle weakness (generalized): Secondary | ICD-10-CM | POA: Diagnosis not present

## 2019-06-06 DIAGNOSIS — R41841 Cognitive communication deficit: Secondary | ICD-10-CM | POA: Diagnosis not present

## 2019-06-06 DIAGNOSIS — R2681 Unsteadiness on feet: Secondary | ICD-10-CM | POA: Diagnosis not present

## 2019-06-07 DIAGNOSIS — R41841 Cognitive communication deficit: Secondary | ICD-10-CM | POA: Diagnosis not present

## 2019-06-07 DIAGNOSIS — R2681 Unsteadiness on feet: Secondary | ICD-10-CM | POA: Diagnosis not present

## 2019-06-07 DIAGNOSIS — M6281 Muscle weakness (generalized): Secondary | ICD-10-CM | POA: Diagnosis not present

## 2019-06-08 DIAGNOSIS — R41841 Cognitive communication deficit: Secondary | ICD-10-CM | POA: Diagnosis not present

## 2019-06-10 ENCOUNTER — Other Ambulatory Visit: Payer: Self-pay | Admitting: Internal Medicine

## 2019-06-11 DIAGNOSIS — R41841 Cognitive communication deficit: Secondary | ICD-10-CM | POA: Diagnosis not present

## 2019-06-11 DIAGNOSIS — Z85828 Personal history of other malignant neoplasm of skin: Secondary | ICD-10-CM | POA: Diagnosis not present

## 2019-06-11 DIAGNOSIS — D2239 Melanocytic nevi of other parts of face: Secondary | ICD-10-CM | POA: Diagnosis not present

## 2019-06-11 DIAGNOSIS — L821 Other seborrheic keratosis: Secondary | ICD-10-CM | POA: Diagnosis not present

## 2019-06-11 DIAGNOSIS — D225 Melanocytic nevi of trunk: Secondary | ICD-10-CM | POA: Diagnosis not present

## 2019-06-11 DIAGNOSIS — L814 Other melanin hyperpigmentation: Secondary | ICD-10-CM | POA: Diagnosis not present

## 2019-06-12 DIAGNOSIS — R2681 Unsteadiness on feet: Secondary | ICD-10-CM | POA: Diagnosis not present

## 2019-06-12 DIAGNOSIS — R41841 Cognitive communication deficit: Secondary | ICD-10-CM | POA: Diagnosis not present

## 2019-06-12 DIAGNOSIS — M6281 Muscle weakness (generalized): Secondary | ICD-10-CM | POA: Diagnosis not present

## 2019-06-13 DIAGNOSIS — M6281 Muscle weakness (generalized): Secondary | ICD-10-CM | POA: Diagnosis not present

## 2019-06-13 DIAGNOSIS — R2681 Unsteadiness on feet: Secondary | ICD-10-CM | POA: Diagnosis not present

## 2019-06-15 DIAGNOSIS — R2681 Unsteadiness on feet: Secondary | ICD-10-CM | POA: Diagnosis not present

## 2019-06-15 DIAGNOSIS — M6281 Muscle weakness (generalized): Secondary | ICD-10-CM | POA: Diagnosis not present

## 2019-06-15 DIAGNOSIS — R41841 Cognitive communication deficit: Secondary | ICD-10-CM | POA: Diagnosis not present

## 2019-06-18 DIAGNOSIS — R41841 Cognitive communication deficit: Secondary | ICD-10-CM | POA: Diagnosis not present

## 2019-06-20 DIAGNOSIS — M6281 Muscle weakness (generalized): Secondary | ICD-10-CM | POA: Diagnosis not present

## 2019-06-20 DIAGNOSIS — R2681 Unsteadiness on feet: Secondary | ICD-10-CM | POA: Diagnosis not present

## 2019-06-20 DIAGNOSIS — R41841 Cognitive communication deficit: Secondary | ICD-10-CM | POA: Diagnosis not present

## 2019-06-21 DIAGNOSIS — R2681 Unsteadiness on feet: Secondary | ICD-10-CM | POA: Diagnosis not present

## 2019-06-21 DIAGNOSIS — M6281 Muscle weakness (generalized): Secondary | ICD-10-CM | POA: Diagnosis not present

## 2019-06-21 DIAGNOSIS — R41841 Cognitive communication deficit: Secondary | ICD-10-CM | POA: Diagnosis not present

## 2019-06-22 DIAGNOSIS — R2681 Unsteadiness on feet: Secondary | ICD-10-CM | POA: Diagnosis not present

## 2019-06-22 DIAGNOSIS — M6281 Muscle weakness (generalized): Secondary | ICD-10-CM | POA: Diagnosis not present

## 2019-06-25 DIAGNOSIS — M6281 Muscle weakness (generalized): Secondary | ICD-10-CM | POA: Diagnosis not present

## 2019-06-25 DIAGNOSIS — R2681 Unsteadiness on feet: Secondary | ICD-10-CM | POA: Diagnosis not present

## 2019-06-25 DIAGNOSIS — R41841 Cognitive communication deficit: Secondary | ICD-10-CM | POA: Diagnosis not present

## 2019-06-26 DIAGNOSIS — R41841 Cognitive communication deficit: Secondary | ICD-10-CM | POA: Diagnosis not present

## 2019-06-26 DIAGNOSIS — R2681 Unsteadiness on feet: Secondary | ICD-10-CM | POA: Diagnosis not present

## 2019-06-26 DIAGNOSIS — M6281 Muscle weakness (generalized): Secondary | ICD-10-CM | POA: Diagnosis not present

## 2019-06-27 DIAGNOSIS — R2681 Unsteadiness on feet: Secondary | ICD-10-CM | POA: Diagnosis not present

## 2019-06-27 DIAGNOSIS — R41841 Cognitive communication deficit: Secondary | ICD-10-CM | POA: Diagnosis not present

## 2019-06-27 DIAGNOSIS — M6281 Muscle weakness (generalized): Secondary | ICD-10-CM | POA: Diagnosis not present

## 2019-06-28 DIAGNOSIS — R41841 Cognitive communication deficit: Secondary | ICD-10-CM | POA: Diagnosis not present

## 2019-06-28 DIAGNOSIS — R2681 Unsteadiness on feet: Secondary | ICD-10-CM | POA: Diagnosis not present

## 2019-06-28 DIAGNOSIS — M6281 Muscle weakness (generalized): Secondary | ICD-10-CM | POA: Diagnosis not present

## 2019-07-02 DIAGNOSIS — M6281 Muscle weakness (generalized): Secondary | ICD-10-CM | POA: Diagnosis not present

## 2019-07-02 DIAGNOSIS — R2681 Unsteadiness on feet: Secondary | ICD-10-CM | POA: Diagnosis not present

## 2019-07-02 DIAGNOSIS — R41841 Cognitive communication deficit: Secondary | ICD-10-CM | POA: Diagnosis not present

## 2019-07-03 DIAGNOSIS — M6281 Muscle weakness (generalized): Secondary | ICD-10-CM | POA: Diagnosis not present

## 2019-07-03 DIAGNOSIS — R2681 Unsteadiness on feet: Secondary | ICD-10-CM | POA: Diagnosis not present

## 2019-07-03 DIAGNOSIS — R41841 Cognitive communication deficit: Secondary | ICD-10-CM | POA: Diagnosis not present

## 2019-07-04 DIAGNOSIS — R2681 Unsteadiness on feet: Secondary | ICD-10-CM | POA: Diagnosis not present

## 2019-07-04 DIAGNOSIS — R41841 Cognitive communication deficit: Secondary | ICD-10-CM | POA: Diagnosis not present

## 2019-07-04 DIAGNOSIS — M6281 Muscle weakness (generalized): Secondary | ICD-10-CM | POA: Diagnosis not present

## 2019-07-06 DIAGNOSIS — R2681 Unsteadiness on feet: Secondary | ICD-10-CM | POA: Diagnosis not present

## 2019-07-06 DIAGNOSIS — M6281 Muscle weakness (generalized): Secondary | ICD-10-CM | POA: Diagnosis not present

## 2019-07-09 DIAGNOSIS — R41841 Cognitive communication deficit: Secondary | ICD-10-CM | POA: Diagnosis not present

## 2019-07-11 DIAGNOSIS — R41841 Cognitive communication deficit: Secondary | ICD-10-CM | POA: Diagnosis not present

## 2019-07-12 DIAGNOSIS — R41841 Cognitive communication deficit: Secondary | ICD-10-CM | POA: Diagnosis not present

## 2019-07-12 DIAGNOSIS — R2681 Unsteadiness on feet: Secondary | ICD-10-CM | POA: Diagnosis not present

## 2019-07-12 DIAGNOSIS — M6281 Muscle weakness (generalized): Secondary | ICD-10-CM | POA: Diagnosis not present

## 2019-07-16 DIAGNOSIS — R41841 Cognitive communication deficit: Secondary | ICD-10-CM | POA: Diagnosis not present

## 2019-07-17 DIAGNOSIS — R2681 Unsteadiness on feet: Secondary | ICD-10-CM | POA: Diagnosis not present

## 2019-07-17 DIAGNOSIS — M6281 Muscle weakness (generalized): Secondary | ICD-10-CM | POA: Diagnosis not present

## 2019-07-17 DIAGNOSIS — R41841 Cognitive communication deficit: Secondary | ICD-10-CM | POA: Diagnosis not present

## 2019-07-24 DIAGNOSIS — M25572 Pain in left ankle and joints of left foot: Secondary | ICD-10-CM | POA: Diagnosis not present

## 2019-07-24 DIAGNOSIS — M79672 Pain in left foot: Secondary | ICD-10-CM | POA: Diagnosis not present

## 2019-07-25 ENCOUNTER — Other Ambulatory Visit: Payer: Self-pay | Admitting: *Deleted

## 2019-07-25 ENCOUNTER — Other Ambulatory Visit: Payer: Self-pay | Admitting: Internal Medicine

## 2019-07-25 MED ORDER — ATORVASTATIN CALCIUM 40 MG PO TABS: ORAL_TABLET | ORAL | 2 refills | Status: DC

## 2019-07-30 ENCOUNTER — Telehealth: Payer: Self-pay | Admitting: Cardiology

## 2019-07-30 NOTE — Telephone Encounter (Signed)
Patient states she is requesting to switch providers due to lack of transportation to the office in [place], [place]. She also states she would like a recommendation from Dr. Bettina Gavia for a cardiologist with [facility] in one of the offices in [place]. Please advise.

## 2019-07-30 NOTE — Telephone Encounter (Signed)
I would recommend Dr. Marlou Porch

## 2019-07-31 ENCOUNTER — Ambulatory Visit: Payer: Medicare HMO | Admitting: Cardiology

## 2019-08-01 NOTE — Telephone Encounter (Signed)
Patient of Dr. Bettina Gavia states she is requesting a new provider due to transportation. Dr. Bettina Gavia recommended you, Dr. Marlou Porch. Are you willing to see the patient?

## 2019-08-03 NOTE — Telephone Encounter (Signed)
[place] with me Candee Furbish, MD

## 2019-08-14 ENCOUNTER — Ambulatory Visit: Payer: Medicare HMO | Admitting: Neurology

## 2019-08-15 ENCOUNTER — Telehealth: Payer: Self-pay | Admitting: Internal Medicine

## 2019-08-15 NOTE — Telephone Encounter (Signed)
Not sure what the visit was scheduled for...did the referral come from [place]?

## 2019-08-15 NOTE — Telephone Encounter (Signed)
Patient was confused. Patient has an appointment with neurology for memory loss.  Patient will keep her appointment,

## 2019-08-15 NOTE — Telephone Encounter (Signed)
Pt would like advice on appt she was referred to urology but when she got there they said they did not have a appt for her. She said that they have scheduled for May 21st, she feels she is better and may not need that appt anymore. She would like a call back to see if she should keep appt or cancel it.   Phone: [phone]

## 2019-08-17 ENCOUNTER — Ambulatory Visit: Payer: Medicare HMO | Admitting: Cardiology

## 2019-08-22 ENCOUNTER — Other Ambulatory Visit: Payer: Self-pay | Admitting: *Deleted

## 2019-08-22 MED ORDER — FAMOTIDINE 20 MG PO TABS
20.0000 mg | ORAL_TABLET | Freq: Every day | ORAL | 1 refills | Status: DC
Start: 2019-08-22 — End: 2019-10-31

## 2019-08-22 MED ORDER — FAMOTIDINE 20 MG PO TABS: ORAL_TABLET | ORAL | 1 refills | Status: DC

## 2019-08-22 NOTE — Addendum Note (Signed)
Addended by: Len Blalock on: 08/22/2019 04:57 PM   Modules accepted: Orders

## 2019-08-22 NOTE — Telephone Encounter (Signed)
Pt is due for an appointment.  Patient contacted and scheduled for an appointment with Dr. Melvyn Novas for June 10th at 9:45 am.  Enough medication sent to patient's pharmacy to last until she follows up with Dr. Melvyn Novas.

## 2019-08-23 ENCOUNTER — Telehealth: Payer: Self-pay | Admitting: *Deleted

## 2019-08-23 NOTE — Telephone Encounter (Signed)
Patient requesting a refill of  Potassium Chloride ER 20 MEQ TBCR

## 2019-08-24 MED ORDER — POTASSIUM CHLORIDE ER 20 MEQ PO TBCR: 1.0000 | EXTENDED_RELEASE_TABLET | Freq: Every day | ORAL | 0 refills | Status: DC

## 2019-08-28 ENCOUNTER — Ambulatory Visit: Payer: Medicare HMO | Admitting: Cardiology

## 2019-08-28 NOTE — Telephone Encounter (Signed)
Medication Refill:  Potassium Chloride ER 20 MEQ Gabapentin 100 MG  Pharmacy: [facility] [place]: [phone]

## 2019-08-29 MED ORDER — GABAPENTIN 100 MG PO CAPS: 200.0000 mg | ORAL_CAPSULE | Freq: Three times a day (TID) | ORAL | 1 refills | Status: DC

## 2019-08-29 NOTE — Addendum Note (Signed)
Addended by: Westley Hummer B on: 08/29/2019 01:51 PM   Modules accepted: Orders

## 2019-08-30 ENCOUNTER — Other Ambulatory Visit: Payer: Self-pay | Admitting: Internal Medicine

## 2019-08-30 MED ORDER — POTASSIUM CHLORIDE ER 20 MEQ PO TBCR: 1.0000 | EXTENDED_RELEASE_TABLET | Freq: Every day | ORAL | 0 refills | Status: DC

## 2019-08-30 MED ORDER — CYCLOBENZAPRINE HCL 10 MG PO TABS: 10.0000 mg | ORAL_TABLET | Freq: Three times a day (TID) | ORAL | 2 refills | Status: DC | PRN

## 2019-08-30 NOTE — Addendum Note (Signed)
Addended by: Westley Hummer B on: 08/30/2019 04:14 PM   Modules accepted: Orders

## 2019-08-30 NOTE — Telephone Encounter (Signed)
Pt is requesting a refill on Cyclobenzaprine 10 mg tablet. Pt uses  [facility] [address]. Pt would also, like to follow up on her Potassium pills. Thanks

## 2019-09-04 DIAGNOSIS — M5136 Other intervertebral disc degeneration, lumbar region: Secondary | ICD-10-CM | POA: Diagnosis not present

## 2019-09-04 DIAGNOSIS — R269 Unspecified abnormalities of gait and mobility: Secondary | ICD-10-CM | POA: Diagnosis not present

## 2019-09-04 DIAGNOSIS — M519 Unspecified thoracic, thoracolumbar and lumbosacral intervertebral disc disorder: Secondary | ICD-10-CM | POA: Diagnosis not present

## 2019-09-04 DIAGNOSIS — M542 Cervicalgia: Secondary | ICD-10-CM | POA: Diagnosis not present

## 2019-09-04 DIAGNOSIS — N182 Chronic kidney disease, stage 2 (mild): Secondary | ICD-10-CM | POA: Diagnosis not present

## 2019-09-14 ENCOUNTER — Telehealth: Payer: Self-pay | Admitting: Internal Medicine

## 2019-09-14 NOTE — Telephone Encounter (Signed)
Does she need a virtual visit? Ok to put in at 4 pm today if she would like.

## 2019-09-14 NOTE — Telephone Encounter (Signed)
Pt has had a deep cough, chest congestion, headache, and runny nose. Pt would like to know if any medication can be called in to the pharmacy or if she can take any over the counter medication? [facility] [address]. Thanks

## 2019-09-14 NOTE — Telephone Encounter (Signed)
Attempted to call patient. Unable to reach her.

## 2019-09-18 ENCOUNTER — Ambulatory Visit: Payer: Medicare HMO | Admitting: Cardiology

## 2019-09-22 ENCOUNTER — Other Ambulatory Visit: Payer: Self-pay | Admitting: Internal Medicine

## 2019-09-26 ENCOUNTER — Encounter: Payer: Self-pay | Admitting: Internal Medicine

## 2019-09-26 ENCOUNTER — Telehealth (INDEPENDENT_AMBULATORY_CARE_PROVIDER_SITE_OTHER): Payer: Medicare HMO | Admitting: Internal Medicine

## 2019-09-26 ENCOUNTER — Other Ambulatory Visit: Payer: Self-pay

## 2019-09-26 DIAGNOSIS — J069 Acute upper respiratory infection, unspecified: Secondary | ICD-10-CM | POA: Diagnosis not present

## 2019-09-26 NOTE — Progress Notes (Signed)
Virtual Visit via Telephone Note  I connected with [name] on 09/26/19 at  3:45 PM EDT by telephone and verified that I am speaking with the correct person using two identifiers.   I discussed the limitations, risks, security and privacy concerns of performing an evaluation and management service by telephone and the availability of in person appointments. I also discussed with the patient that there may be a patient responsible charge related to this service. The patient expressed understanding and agreed to proceed.  Location patient: home Location provider: work office Participants present for the call: patient, provider Patient did not have a visit in the prior 7 days to address this/these issue(s).   History of Present Illness:  She has scheduled this acute visit to discuss some upper respiratory issues that have been going on for days.  She relates that the columellar sinuses.  She has been having rhinorrhea, sinus and chest congestion with a nonproductive cough.  She denies fever or shortness of breath.  She has completed both of her Covid vaccines as of 1 month ago.  She has been using over-the-counter Tylenol sinus, Mucinex DM and Delsym with some relief.   Observations/Objective: Patient sounds cheerful and well on the phone. I do not appreciate any increased work of breathing. Speech and thought processing are grossly intact. Patient reported vitals: None reported   Current Outpatient Medications:  .  acetaminophen (TYLENOL) 325 MG tablet, Take 650 mg by mouth every 6 (six) hours as needed., Disp: , Rfl:  .  atorvastatin (LIPITOR) 40 MG tablet, TAKE 1 TABLET BY MOUTH EVERYDAY AT BEDTIME, Disp: 90 tablet, Rfl: 2 .  Bismuth Subsalicylate (PEPTO-BISMOL PO), Take 1 tablet by mouth daily as needed. , Disp: , Rfl:  .  Ca [id number] (CALCIUM-FOLIC ACID PLUS D PO), Take by mouth daily., Disp: , Rfl:  .  celecoxib (CELEBREX) 200 MG capsule, TAKE 1  CAPSULE (200 MG TOTAL) BY MOUTH DAILY AS NEEDED FOR MODERATE PAIN., Disp: 90 capsule, Rfl: 1 .  CRANBERRY PO, Take 1 capsule by mouth daily., Disp: , Rfl:  .  cyclobenzaprine (FLEXERIL) 10 MG tablet, Take 1 tablet (10 mg total) by mouth 3 (three) times daily as needed for muscle spasms., Disp: 30 tablet, Rfl: 2 .  dextromethorphan-guaiFENesin (MUCINEX DM) 30-600 MG 12hr tablet, Take 1 tablet by mouth 2 (two) times daily as needed for cough., Disp: , Rfl:  .  escitalopram (LEXAPRO) 10 MG tablet, TAKE 1 TABLET BY MOUTH EVERY DAY, Disp: 90 tablet, Rfl: 0 .  famotidine (PEPCID) 20 MG tablet, Take 1 tablet (20 mg total) by mouth daily., Disp: 30 tablet, Rfl: 1 .  famotidine-calcium carbonate-magnesium hydroxide (PEPCID COMPLETE) 10-800-165 MG chewable tablet, Chew 1 tablet by mouth daily as needed., Disp: , Rfl:  .  furosemide (LASIX) 20 MG tablet, furosemide 20 mg tablet  TAKE 1 TABLET BY MOUTH EVERY DAY, Disp: , Rfl:  .  gabapentin (NEURONTIN) 100 MG capsule, Take 2-3 capsules (200-300 mg total) by mouth 3 (three) times daily., Disp: 810 capsule, Rfl: 1 .  Liniments (BLUE-EMU SUPER STRENGTH) CREA, Apply 1 application topically at bedtime as needed (dry skin). , Disp: , Rfl:  .  loperamide (IMODIUM A-D) 2 MG tablet, Take 2 mg by mouth 4 (four) times daily as needed for diarrhea or loose stools., Disp: , Rfl:  .  NON FORMULARY, Take 2 capsules by mouth 2 (two) times daily. [employer]	[place], Disp: , Rfl:  .  olopatadine (PATANOL) 0.1 %  ophthalmic solution, Place 1 drop into both eyes 2 (two) times daily. As directed, Disp: , Rfl:  .  Phenylephrine-Acetaminophen (SINUS PRESSURE + PAIN) 5-325 MG TABS, Take by mouth as directed., Disp: , Rfl:  .  Potassium Chloride ER 20 MEQ TBCR, Take 1 tablet by mouth daily., Disp: 90 tablet, Rfl: 0 .  Probiotic Product (PROBIOTIC DAILY PO), Take 1 capsule by mouth daily. , Disp: , Rfl:  .  Pumpkin Seed-Soy Germ (AZO BLADDER CONTROL/GO-LESS) CAPS, Take by mouth., Disp: , Rfl:  .   simethicone (GAS-X) 80 MG chewable tablet, Chew 80 mg by mouth every 6 (six) hours as needed for flatulence., Disp: , Rfl:  .  amLODipine (NORVASC) 5 MG tablet, Take 1 tablet (5 mg total) by mouth daily., Disp: 90 tablet, Rfl: 1  Review of Systems:  Constitutional: Denies fever, chills, diaphoresis, appetite change and fatigue.  HEENT: Denies photophobia, eye pain, redness, hearing loss, ear pain,  mouth sores, trouble swallowing, neck pain, neck stiffness and tinnitus.   Respiratory: Denies SOB, DOE,  chest tightness,  and wheezing.   Cardiovascular: Denies chest pain, palpitations and leg swelling.  Gastrointestinal: Denies nausea, vomiting, abdominal pain, diarrhea, constipation, blood in stool and abdominal distention.  Genitourinary: Denies dysuria, urgency, frequency, hematuria, flank pain and difficulty urinating.  Endocrine: Denies: hot or cold intolerance, sweats, changes in hair or nails, polyuria, polydipsia. Musculoskeletal: Denies myalgias, back pain, joint swelling, arthralgias and gait problem.  Skin: Denies pallor, rash and wound.  Neurological: Denies dizziness, seizures, syncope, weakness, light-headedness, numbness and headaches.  Hematological: Denies adenopathy. Easy bruising, personal or family bleeding history  Psychiatric/Behavioral: Denies suicidal ideation, mood changes, confusion, nervousness, sleep disturbance and agitation   Assessment and Plan:  Upper respiratory tract infection, unspecified type -Given current clinical presentation do not believe antibiotics are required at this time. -Have advised continued monitoring and OTC symptom management including pain relievers, nasal decongestants,antihistamines, Mucinex, cough suppressants. -She has been advised to reach out to [place] if she has any progressing symptoms.   I discussed the assessment and treatment plan with the patient. The patient was provided an opportunity to ask questions and all were answered. The  patient agreed with the plan and demonstrated an understanding of the instructions.   The patient was advised to call back or seek an in-person evaluation if the symptoms worsen or if the condition fails to improve as anticipated.  I provided 13 minutes of non-face-to-face time during this encounter.   Lelon Frohlich, MD [facility]

## 2019-09-27 ENCOUNTER — Ambulatory Visit (INDEPENDENT_AMBULATORY_CARE_PROVIDER_SITE_OTHER): Payer: Medicare HMO | Admitting: Internal Medicine

## 2019-09-27 ENCOUNTER — Encounter: Payer: Self-pay | Admitting: Internal Medicine

## 2019-09-27 ENCOUNTER — Ambulatory Visit (INDEPENDENT_AMBULATORY_CARE_PROVIDER_SITE_OTHER): Payer: Medicare HMO

## 2019-09-27 DIAGNOSIS — R0609 Other forms of dyspnea: Secondary | ICD-10-CM

## 2019-09-27 DIAGNOSIS — R0600 Dyspnea, unspecified: Secondary | ICD-10-CM

## 2019-09-27 DIAGNOSIS — J90 Pleural effusion, not elsewhere classified: Secondary | ICD-10-CM | POA: Diagnosis not present

## 2019-09-27 DIAGNOSIS — R05 Cough: Secondary | ICD-10-CM

## 2019-09-27 DIAGNOSIS — R059 Cough, unspecified: Secondary | ICD-10-CM

## 2019-09-27 LAB — BASIC METABOLIC PANEL
BUN: 19 mg/dL (ref 6–23)
CO2: 29 mEq/L (ref 19–32)
Calcium: 10.3 mg/dL (ref 8.4–10.5)
Chloride: 101 mEq/L (ref 96–112)
Creatinine, Ser: 0.85 mg/dL (ref 0.40–1.20)
GFR: 63.17 mL/min (ref >60.00)
Glucose, Bld: 96 mg/dL (ref 70–99)
Potassium: 4.5 mEq/L (ref 3.5–5.1)
Sodium: 136 mEq/L (ref 135–145)

## 2019-09-27 LAB — CBC WITH DIFFERENTIAL/PLATELET
Basophils Absolute: 0.1 10*3/uL (ref 0.0–0.1)
Basophils Relative: 1.3 % (ref 0.0–3.0)
Eosinophils Absolute: 0.3 10*3/uL (ref 0.0–0.7)
Eosinophils Relative: 3.6 % (ref 0.0–5.0)
HCT: 36.3 % (ref 36.0–46.0)
Hemoglobin: 12.2 g/dL (ref 12.0–15.0)
Lymphocytes Relative: 18.1 % (ref 12.0–46.0)
Lymphs Abs: 1.3 10*3/uL (ref 0.7–4.0)
MCHC: 33.5 g/dL (ref 30.0–36.0)
MCV: 93.2 fl (ref 78.0–100.0)
Monocytes Absolute: 0.8 10*3/uL (ref 0.1–1.0)
Monocytes Relative: 11.2 % (ref 3.0–12.0)
Neutro Abs: 4.7 10*3/uL (ref 1.4–7.7)
Neutrophils Relative %: 65.8 % (ref 43.0–77.0)
Platelets: 288 10*3/uL (ref 150.0–400.0)
RBC: 3.9 Mil/uL (ref 3.87–5.11)
RDW: 15.2 % (ref 11.5–15.5)
WBC: 7.2 10*3/uL (ref 4.0–10.5)

## 2019-09-27 LAB — HEPATIC FUNCTION PANEL
ALT: 11 U/L (ref 0–35)
AST: 21 U/L (ref 0–37)
Albumin: 4.4 g/dL (ref 3.5–5.2)
Alkaline Phosphatase: 105 U/L (ref 39–117)
Bilirubin, Direct: 0.1 mg/dL (ref 0.0–0.3)
Total Bilirubin: 0.5 mg/dL (ref 0.2–1.2)
Total Protein: 6.9 g/dL (ref 6.0–8.3)

## 2019-09-27 LAB — TSH: TSH: 2.15 u[IU]/mL (ref 0.35–4.50)

## 2019-09-27 LAB — BRAIN NATRIURETIC PEPTIDE: Pro B Natriuretic peptide (BNP): 26 pg/mL (ref 0.0–100.0)

## 2019-09-27 MED ORDER — PANTOPRAZOLE SODIUM 40 MG PO TBEC: 40.0000 mg | DELAYED_RELEASE_TABLET | Freq: Every day | ORAL | 2 refills | Status: DC

## 2019-09-27 NOTE — Progress Notes (Signed)
[name], female    DOB: 06/17/1929,  .   MRN: [id number]   Brief patient profile:  26 yowf never smoker never breathing problems until fell tgiving 2019  and hit head in bathroom hitting on marble flooring and doe since so referred to pulmonary clinic 06/06/2018 by Dr Larose Kells p neg cardiac w/u by [facility]   Note seen pulmonary clinic  09/25/10 with "cough since 2010" with dx UACS rec gerd rx and zyrtec and improved but never completely resolved.     History of Present Illness  06/06/2018  Pulmonary/ 1st office eval/[name]  Chief Complaint  Patient presents with  . Pulmonary Consult    Referred by Dr. Larose Kells.  Pt c/o DOE with any exertion since she fell Thanksgiving 2019.    Dyspnea:  abrupt onset in nov 2019 p fell - barely now able to do HT p using HC parking = MMRC3 = can't walk 100 yards even at a slow pace at a flat grade s stopping due to sob   Cough: has had some pnds x decades no recent eval by allergy or ent  Sleep: in recliner ever before she fell same angle almost flat no problem with cough /wheeze or sob  SABA use: none Pos sense of globus  Prev w/u 2012 better on gerd rx/ zyrtec rec Try zyrtec 10 mg at bedtime instead of tylenol sinus  Pantoprazole (protonix) 40 mg   Take  30-60 min before first meal of the day and Pepcid (famotidine)  20 mg one after supper  until return to office - this is the best way to tell whether stomach acid is contributing to your problem.   GERD  Diet  Please schedule a follow up office visit in 6 weeks, call sooner if needed with all medications /inhalers/ solutions in hand so we can verify exactly what you are taking. This includes all medications from all doctors and over the counters with pfts on return     06/26/2018  f/u ov/[name] re: uacs/ globus sensation  Chief Complaint  Patient presents with  . Follow-up    Breathing is unchanged. No new co's.   Dyspnea:  Walks with cane room to room then gives out more due  to orthopedic than pulmonary  limitations  Cough: sense of pnds/ globus no better on zyrtec and never took ppi Sleeping: In recliner chronically  rec Pantoprazole (protonix) 40 mg   Take  30-60 min before first meal of the day and Pepcid (famotidine)  20 mg one @  bedtime until return to office whether you think you need it or not   GERD  Diet  Keep walking as much possible    10/17/2018  f/u ov/[name] re: uacs better p gerd rx/ off it for several weeks s flare     Chief Complaint  Patient presents with  . Follow-up    Breathing is some better.   Dyspnea:  Walks with cane sob x 50 ft  Cough: gone  Sleeping: almost flat in recliner s resp symptoms  rec GERD diet  Continue Pepcid 20 mg one hour before bedtme   09/27/2019  f/u ov/[name] re: uac  On gabapentin 300 tid  Chief Complaint  Patient presents with  . Follow-up    more SOB  Dyspnea:  From one room to the other more sob  Cough: worse p eat / worse at hs  Sleeping: 45 degrees helps the cough / no true orthopnea SABA use: none 02: none  No obvious day to day or daytime variability or assoc excess/ purulent sputum or mucus plugs or hemoptysis or cp or chest tightness, subjective wheeze or overt sinus or hb symptoms.    Also denies any obvious fluctuation of symptoms with weather or environmental changes or other aggravating or alleviating factors except as outlined above   No unusual exposure hx or h/o childhood pna/ asthma or knowledge of premature birth.  Current Allergies, Complete Past Medical History, Past Surgical History, Family History, and Social History were reviewed in [employer] record.  ROS  The following are not active complaints unless bolded Hoarseness, sore throat, dysphagia/globus , dental problems, itching, sneezing,  nasal congestion or discharge of excess mucus or purulent secretions, ear ache,   fever, chills, sweats, unintended wt loss or wt gain, classically pleuritic or exertional cp,  orthopnea pnd or  arm/hand swelling  or leg swelling, presyncope, palpitations, abdominal pain, anorexia, nausea, vomiting, diarrhea  or change in bowel habits or change in bladder habits, change in stools or change in urine, dysuria, hematuria,  rash, arthralgias, visual complaints, headache, numbness, weakness or ataxia or problems with walking or coordination,  change in mood or  memory.        Current Meds  Medication Sig  . acetaminophen (TYLENOL) 325 MG tablet Take 650 mg by mouth every 6 (six) hours as needed.  Marland Kitchen atorvastatin (LIPITOR) 40 MG tablet TAKE 1 TABLET BY MOUTH EVERYDAY AT BEDTIME  . Bismuth Subsalicylate (PEPTO-BISMOL PO) Take 1 tablet by mouth daily as needed.   . Ca [id number] (CALCIUM-FOLIC ACID PLUS D PO) Take by mouth daily.  . celecoxib (CELEBREX) 200 MG capsule TAKE 1 CAPSULE (200 MG TOTAL) BY MOUTH DAILY AS NEEDED FOR MODERATE PAIN.  Marland Kitchen CRANBERRY PO Take 1 capsule by mouth daily.  . cyclobenzaprine (FLEXERIL) 10 MG tablet Take 1 tablet (10 mg total) by mouth 3 (three) times daily as needed for muscle spasms.  Marland Kitchen dextromethorphan-guaiFENesin (MUCINEX DM) 30-600 MG 12hr tablet Take 1 tablet by mouth 2 (two) times daily as needed for cough.  . escitalopram (LEXAPRO) 10 MG tablet TAKE 1 TABLET BY MOUTH EVERY DAY  . famotidine (PEPCID) 20 MG tablet Take 1 tablet (20 mg total) by mouth daily.  . famotidine-calcium carbonate-magnesium hydroxide (PEPCID COMPLETE) 10-800-165 MG chewable tablet Chew 1 tablet by mouth daily as needed.  . furosemide (LASIX) 20 MG tablet furosemide 20 mg tablet  TAKE 1 TABLET BY MOUTH EVERY DAY  . gabapentin (NEURONTIN) 100 MG capsule Take 2-3 capsules (200-300 mg total) by mouth 3 (three) times daily.  . Liniments (BLUE-EMU SUPER STRENGTH) CREA Apply 1 application topically at bedtime as needed (dry skin).   Marland Kitchen loperamide (IMODIUM A-D) 2 MG tablet Take 2 mg by mouth 4 (four) times daily as needed for diarrhea or loose stools.  . NON FORMULARY Take 2  capsules by mouth 2 (two) times daily. [employer]  . olopatadine (PATANOL) 0.1 % ophthalmic solution Place 1 drop into both eyes 2 (two) times daily. As directed  . Phenylephrine-Acetaminophen (SINUS PRESSURE + PAIN) 5-325 MG TABS Take by mouth as directed.  . Potassium Chloride ER 20 MEQ TBCR Take 1 tablet by mouth daily.  . Probiotic Product (PROBIOTIC DAILY PO) Take 1 capsule by mouth daily.   . Pumpkin Seed-Soy Germ (AZO BLADDER CONTROL/GO-LESS) CAPS Take by mouth.  . simethicone (GAS-X) 80 MG chewable tablet Chew 80 mg by mouth every 6 (six) hours as needed for flatulence.  Objective:      09/27/2019       159 10/17/2018       164   06/26/18 163 lb (73.9 kg)  06/07/18 164 lb (74.4 kg)  06/06/18 164 lb (74.4 kg)     amb wf nad   - note did not bring walking for a walking study which was intended to reproduce her doe across the room in the office setting with pulse ox monitoring   Vital signs reviewed  09/27/2019  - Note at rest 02 sats  95% on RA         HEENT : pt wearing mask not removed for exam due to covid -19 concerns.    NECK :  without JVD/Nodes/TM/ nl carotid upstrokes bilaterally   LUNGS: no acc muscle use,  Nl contour chest which is clear to A and P bilaterally without cough on insp or exp maneuvers   CV:  RRR  no s3 or murmur or increase in P2, and 1+ pitting edema  both LE   ABD:  soft and nontender with nl inspiratory excursion in the supine position. No bruits or organomegaly appreciated, bowel sounds nl  MS:  Very slow with cane ext warm without deformities, calf tenderness, cyanosis or clubbing No obvious joint restrictions   SKIN: warm and dry with chronic venous stasis changes both LE's   NEURO:  alert, approp, nl sensorium with  no motor or cerebellar deficits apparent.          CXR PA and Lateral:   09/27/2019 :    I personally reviewed images and agree with radiology impression as follows:    1.  No radiographic  evidence of acute cardiopulmonary disease.  Labs ordered/ reviewed:      Chemistry      Component Value Date/Time   NA 136 09/27/2019 1056   NA 141 04/30/2019 1111   K 4.5 09/27/2019 1056   CL 101 09/27/2019 1056   CO2 29 09/27/2019 1056   BUN 19 09/27/2019 1056   BUN 14 04/30/2019 1111   CREATININE 0.85 09/27/2019 1056      Component Value Date/Time   CALCIUM 10.3 09/27/2019 1056   ALKPHOS 105 09/27/2019 1056   AST 21 09/27/2019 1056   ALT 11 09/27/2019 1056   BILITOT 0.5 09/27/2019 1056   BILITOT 0.5 04/30/2019 1111        Lab Results  Component Value Date   WBC 7.2 09/27/2019   HGB 12.2 09/27/2019   HCT 36.3 09/27/2019   MCV 93.2 09/27/2019   PLT 288.0 09/27/2019       EOS                                                              0.3                                     09/27/2019   Lab Results  Component Value Date   DDIMER 1.09 (H) 10/17/2018      Lab Results  Component Value Date   TSH 2.15 09/27/2019     Lab Results  Component Value Date   PROBNP 26.0 09/27/2019       Lab Results  Component Value Date   ESRSEDRATE 5 09/23/2017   ESRSEDRATE 8 12/06/2012   ESRSEDRATE 9 06/09/2010           Labs ordered 09/27/2019  :  allergy profile   IGE 3 / Eos 0.3               Assessment

## 2019-09-27 NOTE — Patient Instructions (Addendum)
For drainage / throat tickle try take CHLORPHENIRAMINE  4 mg  (Chlortab 4mg   at [employer] should be easiest to find in the green box)  take one every 4 hours as needed - available over the counter- may cause drowsiness so start with just a bedtime dose or two and see how you tolerate it before trying in daytime  - take one hour before bed  Pantoprazole (protonix) 40 mg   Take  30-60 min before first meal of the day and Pepcid (famotidine)  20 mg one supper  until return to office - this is the best way to tell whether stomach acid is contributing to your problem.    GERD (REFLUX)  is an extremely common cause of respiratory symptoms just like yours , many times with no obvious heartburn at all.    It can be treated with medication, but also with lifestyle changes including elevation of the head of your bed (ideally with 6 -8inch blocks under the headboard of your bed),  Smoking cessation, avoidance of late meals, excessive alcohol, and avoid fatty foods, chocolate, peppermint, colas, red wine, and acidic juices such as orange juice.  NO MINT OR MENTHOL PRODUCTS SO NO COUGH DROPS  USE SUGARLESS CANDY INSTEAD (Jolley ranchers or Stover's or Life Savers) or even ice chips will also do - the key is to swallow to prevent all throat clearing. NO OIL BASED VITAMINS - use powdered substitutes.  Avoid fish oil when coughing.  Please remember to go to the lab and x-ray department   for your tests - we will call you with the results when they are available.     Please schedule a follow up office visit in 6 weeks, call sooner if needed with all medications /inhalers/ solutions in hand so we can verify exactly what you are taking. This includes all medications from all doctors and over the counters - bring your walker  Add:  Consider DgEs next

## 2019-09-28 LAB — IGE: IgE (Immunoglobulin E), Serum: 3 kU/L (ref <=114)

## 2019-09-28 NOTE — Progress Notes (Signed)
Spoke with patient and provided CXR results.  She verbalized understanding.

## 2019-09-30 ENCOUNTER — Encounter: Payer: Self-pay | Admitting: Internal Medicine

## 2019-09-30 NOTE — Assessment & Plan Note (Signed)
Onset 02/2018 p fell in bathroom - Echo 05/02/2018 Left ventricle: The cavity size was normal. Wall thickness was   normal. Systolic function was normal. The estimated ejection   fraction was in the range of 55% to 60%. Wall motion was normal;   there were no regional wall motion abnormalities. Doppler   parameters are consistent with abnormal left ventricular   relaxation (grade 1 diastolic dysfunction). - Mitral valve: Mildly calcified annulus. There was mild   regurgitation./ LA size nl  - 06/06/2018   Walked RA x one lap =  approx 250 ft - stopped due to  Sob at faster than avg pace, no desats PFT's  10/17/2018  FEV1 1.34 (79 % ) ratio 0.77  p 8 % improvement from saba p nothing prior to study with DLCO  72 % corrects to 104 % for alv volume    Symptoms are markedly disproportionate to objective findings and not clear to what extent this is actually a pulmonary  problem but pt does appear to have difficult to sort out respiratory symptoms of unknown origin for which  DDX  = almost all start with A and  include Adherence, Ace Inhibitors, Acid Reflux, Active Sinus Disease, Alpha 1 Antitripsin deficiency, Anxiety masquerading as Airways dz,  ABPA,  Allergy(esp in young), Aspiration (esp in elderly), Adverse effects of meds,  Active smoking or Vaping, A bunch of PE's/clot burden (a few small clots can't cause this syndrome unless there is already severe underlying pulm or vascular dz with poor reserve),  Anemia or thyroid disorder, plus two Bs  = Bronchiectasis and Beta blocker use..and one C= CHF     Adherence is always the initial "prime suspect" and is a multilayered concern that requires a "trust but verify" approach in every patient - starting with knowing how to use medications, especially inhalers, correctly, keeping up with refills and understanding the fundamental difference between maintenance and prns vs those medications only taken for a very short course and then stopped and not refilled.   - rec return with all meds in hand using a trust but verify approach to confirm accurate Medication  Reconciliation The principal here is that until we are certain that the  patients are doing what we've asked, it makes no sense to ask them to do more.    ? Acid (or non-acid) GERD > always difficult to exclude as up to 75% of pts in some series report no assoc GI/ Heartburn symptoms> rec max (24h)  acid suppression and diet restrictions/ reviewed and instructions given in writing.   ? Adverse drug effects > none of the usual suspects listed   ? Allergy > profile not very impressive but may benefit from more freq use of 1st gen H1 blockers per guidelines  If tolerates side effects  ? Anxiety/depression/deconditioning > usually at the bottom of this list of usual suspects but should be much higher on this pt's based on H and P and note already on psychotropics and may interfere with adherence and also interpretation of response or lack thereof to symptom management which can be quite subjective.   ? Anemia/ thyroid dz > excluded today    ? A bunch of PE's > D dimer high nl for age/ chronic illness -  high normal value (seen commonly in the elderly or chronically ill)  may miss small peripheral pe, the clot burden with sob is moderately high and the d dimer  has a very high neg pred value if used  in this setting.    ? chf > note has MR on echo but nl bnp/ cxr so less likely

## 2019-10-01 NOTE — Assessment & Plan Note (Signed)
Onset 2010  - Improved 2012  on Zyrtec typical of upper airway cough syndrome   - worse off zyrtec / gerd rx 06/06/2018 so rec rechallenge with zyrtec/ ppi/h1hs and f/u in 6 weeks  - Allergy profile 06/23/18  >  Eos 0.2 /  IgE  4 RAST neg  - Allergy profile 09/27/2019 >  Eos 0.3 /  IgE  3  Had improved now worse p eating and hs more suggestive of Upper airway cough syndrome (previously labeled PNDS),  is so named because it's frequently impossible to sort out how much is  CR/sinusitis with freq throat clearing (which can be related to primary GERD)   vs  causing  secondary (" extra esophageal")  GERD from wide swings in gastric pressure that occur with throat clearing, often  promoting self use of mint and menthol lozenges that reduce the lower esophageal sphincter tone and exacerbate the problem further in a cyclical fashion.   These are the same pts (now being labeled as having "irritable larynx syndrome" by some cough centers) who not infrequently have a history of having failed to tolerate ace inhibitors,  dry powder inhalers or biphosphonates or report having atypical/extraesophageal reflux symptoms that don't respond to standard doses of PPI  and are easily confused as having aecopd or asthma flares by even experienced allergists/ pulmonologists (myself included).   rec max rx for gerd again and consider DgEs next if not improving.   Medical decision making was a moderate level of complexity in this case because of  two chronic conditions /diagnoses requiring extra time for  H and P, chart review, counseling,    and generating customized AVS unique to this office visit and charting.   Each maintenance medication was reviewed in detail including emphasizing most importantly the difference between maintenance and prns and under what circumstances the prns are to be triggered using an action plan format where appropriate. Please see avs for details which were reviewed in writing by both me and my nurse  and patient given a written copy highlighted where appropriate with yellow highlighter for the patient's continued care at home along with an updated version of their medications.  Patient was asked to maintain medication reconciliation by comparing this list to the actual medications being used at home and to contact this office right away if there is a conflict or discrepancy.

## 2019-10-02 DIAGNOSIS — M542 Cervicalgia: Secondary | ICD-10-CM | POA: Diagnosis not present

## 2019-10-08 NOTE — Progress Notes (Signed)
Chief Complaint  Patient presents with  . Flank Pain    Left lower back pain and left flank pain, happened a week and a half ago, was moving boxes    HPI: [name] 86 y.o. come in for SDA acute visit  PCP appt NA   [place] and (  Since December  ) has been  move ing some boxes does have some help. Active  No fall fever  uti sx   Pain  Continuous now 7/10  Used a topical with some help ( has had tachy cardia racing with other patches?)    Frequency and bm  Some  But no drastic changes  recently got over shingles right lower back   Dr Vertell Limber did back surgery  3 rods   And can still bend.   2010   ROS: See pertinent positives and negatives per HPI.  Past Medical History:  Diagnosis Date  . Anxiety and depression   . Back pain    lumbar  . Fecal incontinence    Dr. Michail Sermon, likely due to loose stools in conjunction with pelvic muscle laxity from aging, recommended to stop Miralax  . GI (gastrointestinal bleed)    hx of colon polyps 2004, diverticulosis, internal hemmorhids  . Headache   . Headache(784.0)   . Hydronephrosis    Right 2008 s/p urology eval CT/cysto: observation/monitor  . Hyperlipidemia   . Hypertension   . Knee pain    right chronic  . Migraine    on topamax , per neuro  . Osteoporosis   . Pulmonary nodules    [place]. First seen by CT only 05/04/03, no change on f/u 08/08/06 -Macroscopic changes rml 04/27/07 > resolved april 6,2010 no further w/u   . Seborrheic keratosis    Left inferior posterior flank  . SVT (supraventricular tachycardia) (HCC)    sees Dr.Ross    Family History  Problem Relation Age of Onset  . Breast cancer Mother        later in life  . Diabetes Mother        late in life  . Heart disease Mother        late onset  . Lung cancer Father        apparently had TB  . Heart disease Father        late onset  . Prostate cancer Father   . Colitis Brother   . Cancer Brother   . Colon cancer Neg Hx      Social History   Socioeconomic History  . Marital status: Widowed    Spouse name: Not on file  . Number of children: 1  . Years of education: college   . Highest education level: Doctorate  Occupational History  . Occupation: retired   Tobacco Use  . Smoking status: Never Smoker  . Smokeless tobacco: Never Used  Vaping Use  . Vaping Use: Never used  Substance and Sexual Activity  . Alcohol use: Yes    Alcohol/week: 1.0 standard drink    Types: 1 Glasses of wine per week    Comment: rarely, 1 glass of wine monthly  . Drug use: No  . Sexual activity: Not Currently  Other Topics Concern  . Not on file  Social History Narrative   Moved from a retirement home to a condominium 04-2013, independent living,  lives by herself , drives    Son lives in [place]   Right handed  1 cup of caffeine per day    Social Determinants of Health   Financial Resource Strain:   . Difficulty of Paying Living Expenses:   Food Insecurity:   . Worried About Charity fundraiser in the Last Year:   . Arboriculturist in the Last Year:   Transportation Needs:   . Film/video editor (Medical):   Marland Kitchen Lack of Transportation (Non-Medical):   Physical Activity:   . Days of Exercise per Week:   . Minutes of Exercise per Session:   Stress:   . Feeling of Stress :   Social Connections:   . Frequency of Communication with Friends and Family:   . Frequency of Social Gatherings with Friends and Family:   . Attends Religious Services:   . Active Member of Clubs or Organizations:   . Attends Archivist Meetings:   Marland Kitchen Marital Status:     Outpatient Medications Prior to Visit  Medication Sig Dispense Refill  . acetaminophen (TYLENOL) 325 MG tablet Take 650 mg by mouth every 6 (six) hours as needed.    Marland Kitchen amLODipine (NORVASC) 5 MG tablet Take 1 tablet (5 mg total) by mouth daily. 90 tablet 1  . atorvastatin (LIPITOR) 40 MG tablet TAKE 1 TABLET BY MOUTH EVERYDAY AT BEDTIME 90 tablet 2  .  Bismuth Subsalicylate (PEPTO-BISMOL PO) Take 1 tablet by mouth daily as needed.     . Ca [id number] (CALCIUM-FOLIC ACID PLUS D PO) Take by mouth daily.    . celecoxib (CELEBREX) 200 MG capsule TAKE 1 CAPSULE (200 MG TOTAL) BY MOUTH DAILY AS NEEDED FOR MODERATE PAIN. 90 capsule 1  . CRANBERRY PO Take 1 capsule by mouth daily.    . cyclobenzaprine (FLEXERIL) 10 MG tablet Take 1 tablet (10 mg total) by mouth 3 (three) times daily as needed for muscle spasms. 30 tablet 2  . dextromethorphan-guaiFENesin (MUCINEX DM) 30-600 MG 12hr tablet Take 1 tablet by mouth 2 (two) times daily as needed for cough.    . escitalopram (LEXAPRO) 10 MG tablet TAKE 1 TABLET BY MOUTH EVERY DAY 90 tablet 0  . famotidine (PEPCID) 20 MG tablet Take 1 tablet (20 mg total) by mouth daily. 30 tablet 1  . famotidine-calcium carbonate-magnesium hydroxide (PEPCID COMPLETE) 10-800-165 MG chewable tablet Chew 1 tablet by mouth daily as needed.    . gabapentin (NEURONTIN) 100 MG capsule Take 2-3 capsules (200-300 mg total) by mouth 3 (three) times daily. 810 capsule 1  . Liniments (BLUE-EMU SUPER STRENGTH) CREA Apply 1 application topically at bedtime as needed (dry skin).     Marland Kitchen loperamide (IMODIUM A-D) 2 MG tablet Take 2 mg by mouth 4 (four) times daily as needed for diarrhea or loose stools.    . NON FORMULARY Take 2 capsules by mouth 2 (two) times daily. Hydro Eye    . olopatadine (PATANOL) 0.1 % ophthalmic solution Place 1 drop into both eyes 2 (two) times daily. As directed    . pantoprazole (PROTONIX) 40 MG tablet Take 1 tablet (40 mg total) by mouth daily. Take 30-60 min before first meal of the day 30 tablet 2  . Phenylephrine-Acetaminophen (SINUS PRESSURE + PAIN) 5-325 MG TABS Take by mouth as directed.    . Potassium Chloride ER 20 MEQ TBCR Take 1 tablet by mouth daily. 90 tablet 0  . Probiotic Product (PROBIOTIC DAILY PO) Take 1 capsule by mouth daily.     . Pumpkin Seed-Soy Germ (AZO BLADDER CONTROL/GO-LESS)  CAPS Take by  mouth.    . simethicone (GAS-X) 80 MG chewable tablet Chew 80 mg by mouth every 6 (six) hours as needed for flatulence.    . furosemide (LASIX) 20 MG tablet furosemide 20 mg tablet  TAKE 1 TABLET BY MOUTH EVERY DAY (Patient not taking: Reported on 10/09/2019)     No facility-administered medications prior to visit.     EXAM:  BP 136/76   Pulse 70   Temp 98 F (36.7 C) (Temporal)   Ht 5\' 2"  (1.575 m)   Wt 160 lb 9.6 oz (72.8 kg)   SpO2 97%   BMI 29.37 kg/m   Body mass index is 29.37 kg/m.  GENERAL: vitals reviewed and listed above, alert, oriented, appears well hydrated and in no acute distress ambulatory  Independent steady  HEENT: atraumatic, conjunctiva  clear, no obvious abnormalities on inspection of external nose and ears OP : masked  NECK: no obvious masses on inspection palpation  LUNGS: clear to auscultation bilaterally, no wheezes, rales or rhonchi, good air movement CV: HRRR,  Back healed  Lower scar  Non tender   mideling   Area of palm sized pain is left paraspinal sacral area with out skin changes  No le weakness  . Faded skin pigment chnages on left ( from shingles)  MS: moves all extremities  Abnormality djd changes  PSYCH: pleasant and cooperative, no obvious depression or anxiety Lab Results  Component Value Date   WBC 7.2 09/27/2019   HGB 12.2 09/27/2019   HCT 36.3 09/27/2019   PLT 288.0 09/27/2019   GLUCOSE 96 09/27/2019   CHOL 146 04/30/2019   TRIG 148 04/30/2019   HDL 48 04/30/2019   LDLDIRECT 78.0 12/20/2012   LDLCALC 72 04/30/2019   ALT 11 09/27/2019   AST 21 09/27/2019   NA 136 09/27/2019   K 4.5 09/27/2019   CL 101 09/27/2019   CREATININE 0.85 09/27/2019   BUN 19 09/27/2019   CO2 29 09/27/2019   TSH 2.15 09/27/2019   MICROALBUR <0.7 05/10/2016   BP Readings from Last 3 Encounters:  10/09/19 136/76  09/27/19 116/70  04/30/19 (!) 172/94  .had cxray 6 21 dr wert Urinalysis    Component Value Date/Time   COLORURINE YELLOW  11/23/2018 1330   APPEARANCEUR CLEAR 11/23/2018 1330   LABSPEC 1.020 11/23/2018 1330   PHURINE 5.5 11/23/2018 1330   GLUCOSEU NEGATIVE 11/23/2018 1330   HGBUR NEGATIVE 11/23/2018 1330   HGBUR large 03/16/2010 1140   BILIRUBINUR Negative 10/09/2019 1059   KETONESUR NEGATIVE 11/23/2018 1330   PROTEINUR Negative 10/09/2019 1059   PROTEINUR NEGATIVE 10/25/2018 1050   UROBILINOGEN 0.2 10/09/2019 1059   UROBILINOGEN 0.2 11/23/2018 1330   NITRITE Negative 10/09/2019 1059   NITRITE NEGATIVE 11/23/2018 1330   LEUKOCYTESUR Negative 10/09/2019 1059   [place] 11/23/2018 1330     ASSESSMENT AND PLAN:  Discussed the following assessment and plan:  Left paraspinal back pain - Plan: POCT urinalysis dipstick  Back pain with history of spinal surgery Seems mechanical  And poss overuse from  On going moving  Arrangement    No alarm sx otherwise x age  And no associations .  Relative rest tylenol and if  persistent or progressive fu PCP consider imaging cause of age  And persistent and hx of back surgery .  -Patient advised to return or notify health care team  if  new concerns arise.  Patient Instructions  Seems to be related to muscular skeletal cause  And not infection or kidney  Consider plain x ray to rule out surprises .suspect arthritis and  Overuse  in the back area. As the cause  May considier seeing physical therapy or pcp back person   If  persistent or progressive   Can you get help with moving  Boxes etc?  Relative rest may be helpful for the next few weeks .       Standley Brooking. Tylene Quashie M.D.

## 2019-10-09 ENCOUNTER — Other Ambulatory Visit: Payer: Self-pay

## 2019-10-09 ENCOUNTER — Ambulatory Visit (INDEPENDENT_AMBULATORY_CARE_PROVIDER_SITE_OTHER): Payer: Medicare HMO | Admitting: Internal Medicine

## 2019-10-09 ENCOUNTER — Encounter: Payer: Self-pay | Admitting: Internal Medicine

## 2019-10-09 VITALS — BP 136/76 | HR 70 | Temp 98.0°F | Ht 62.0 in | Wt 160.6 lb

## 2019-10-09 DIAGNOSIS — M549 Dorsalgia, unspecified: Secondary | ICD-10-CM | POA: Diagnosis not present

## 2019-10-09 DIAGNOSIS — M5489 Other dorsalgia: Secondary | ICD-10-CM | POA: Diagnosis not present

## 2019-10-09 DIAGNOSIS — Z9889 Other specified postprocedural states: Secondary | ICD-10-CM

## 2019-10-09 LAB — POCT URINALYSIS DIPSTICK
Bilirubin, UA: NEGATIVE
Blood, UA: NEGATIVE
Glucose, UA: NEGATIVE
Ketones, UA: NEGATIVE
Leukocytes, UA: NEGATIVE
Nitrite, UA: NEGATIVE
Protein, UA: NEGATIVE
Spec Grav, UA: 1.015 (ref 1.010–1.025)
Urobilinogen, UA: 0.2 E.U./dL
pH, UA: 6 (ref 5.0–8.0)

## 2019-10-09 NOTE — Patient Instructions (Addendum)
Seems to be related to muscular skeletal cause  And not infection or kidney  Consider plain x ray to rule out surprises .suspect arthritis and  Overuse  in the back area. As the cause  May considier seeing physical therapy or pcp back person   If  persistent or progressive   Can you get help with moving  Boxes etc?  Relative rest may be helpful for the next few weeks .

## 2019-10-11 ENCOUNTER — Other Ambulatory Visit: Payer: Self-pay | Admitting: *Deleted

## 2019-10-11 DIAGNOSIS — M542 Cervicalgia: Secondary | ICD-10-CM | POA: Diagnosis not present

## 2019-10-11 MED ORDER — ATORVASTATIN CALCIUM 40 MG PO TABS: ORAL_TABLET | ORAL | 1 refills | Status: DC

## 2019-10-14 ENCOUNTER — Other Ambulatory Visit: Payer: Self-pay | Admitting: Internal Medicine

## 2019-10-18 DIAGNOSIS — M542 Cervicalgia: Secondary | ICD-10-CM | POA: Diagnosis not present

## 2019-10-23 ENCOUNTER — Ambulatory Visit: Payer: Medicare HMO | Admitting: Internal Medicine

## 2019-10-25 ENCOUNTER — Encounter: Payer: Self-pay | Admitting: Internal Medicine

## 2019-10-25 ENCOUNTER — Telehealth (INDEPENDENT_AMBULATORY_CARE_PROVIDER_SITE_OTHER): Payer: Medicare HMO | Admitting: Internal Medicine

## 2019-10-25 ENCOUNTER — Other Ambulatory Visit: Payer: Self-pay

## 2019-10-25 VITALS — Ht 62.0 in | Wt 160.0 lb

## 2019-10-25 DIAGNOSIS — M549 Dorsalgia, unspecified: Secondary | ICD-10-CM | POA: Diagnosis not present

## 2019-10-25 DIAGNOSIS — M5489 Other dorsalgia: Secondary | ICD-10-CM

## 2019-10-25 NOTE — Progress Notes (Signed)
   Virtual Visit via Telephone Note  I connected with@ on 10/25/19 at 11:00 AM EDT by telephone and verified that I am speaking with the correct person using two identifiers.   I discussed the limitations, risks, security and privacy concerns of performing an evaluation and management service by telephone and the limited availability of in person appointments. tThere may be a patient responsible charge related to this service. The patient expressed understanding and agreed to proceed.  Location patient: home Location provider: work or home office Participants present for the call: patient, provider Patient did not have a visit in the prior 7 days to address this/these issue(s).   History of Present Illness: [name]   Presents with  continuing left lwer back pain seen in office over 2 weeks ago  And has babies her back without  Lifting etc. However pain is progressing worse  Best  with sitting and not moving otherwise  Pain all the time   No sig radiation but  oine to right and  ocass around to left side .  No systemic sx change or weakness   recenet seen dr Tonita Cong  With PT for neck pain felt djd  Neck  Observations/Objective: Patient sounds personable and well on the phone. I do not appreciate any SOB. Speech and thought processing are grossly intact. Patient reported vitals: Lab Results  Component Value Date   WBC 7.2 09/27/2019   HGB 12.2 09/27/2019   HCT 36.3 09/27/2019   PLT 288.0 09/27/2019   GLUCOSE 96 09/27/2019   CHOL 146 04/30/2019   TRIG 148 04/30/2019   HDL 48 04/30/2019   LDLDIRECT 78.0 12/20/2012   LDLCALC 72 04/30/2019   ALT 11 09/27/2019   AST 21 09/27/2019   NA 136 09/27/2019   K 4.5 09/27/2019   CL 101 09/27/2019   CREATININE 0.85 09/27/2019   BUN 19 09/27/2019   CO2 29 09/27/2019   TSH 2.15 09/27/2019   MICROALBUR <0.7 05/10/2016    Assessment and Plan: Left paraspinal back pain - Plan: DG Lumbar Spine Complete, Ambulatory referral to  Neurosurgery  Back pain with history of spinal surgery - Plan: DG Lumbar Spine Complete, Ambulatory referral to Neurosurgery had surgery dr Vertell Limber  2011    Still suspect ms cause but progressing pain  Needs more evaluation    Follow Up Instructions: Get x ray  [address] x ray [place] will notify you of results.  Referring to dr Kerin Salen al practice   NS for more evaluation with hx of spinal surgery preoviously  And help with future plan.  Her last mri back was  A year ago and stable    I discussed the assessment and treatment plan with the patient. The patient was provided an opportunity to ask questions and answered. The patient agreed with the plan and demonstrated an understanding of the instructions.   The patient was advised to call back or seek an in-person evaluation if the symptoms worsen or if the condition fails to improve as anticipated.  I provided 18  minutes of non-face-to-face time during this encounter. And  Record review  28 minutes  Return for depending on results or if worse .  [name], MD

## 2019-10-31 ENCOUNTER — Other Ambulatory Visit: Payer: Self-pay | Admitting: Internal Medicine

## 2019-11-01 ENCOUNTER — Telehealth: Payer: Self-pay | Admitting: Internal Medicine

## 2019-11-01 DIAGNOSIS — M542 Cervicalgia: Secondary | ICD-10-CM | POA: Diagnosis not present

## 2019-11-01 NOTE — Telephone Encounter (Signed)
I think Dr Regis Bill saw her for her hip pain?

## 2019-11-01 NOTE — Telephone Encounter (Signed)
Patient called wanting to know if  Dr. Jerilee Hoh has placed the order for the MRI for her hip.  Please advise

## 2019-11-02 NOTE — Telephone Encounter (Signed)
Spoke with patient and gave her the information about Dr Vertell Limber:    referral placed on [facility]  [address] [place] [address] [place], [place] [address] Phone: [phone] Their office will contact pt to schedule once  Ov notes are reviewed      She will call their office to schedule an appointment.

## 2019-11-02 NOTE — Telephone Encounter (Signed)
Contact patient  And clarify I saw her for back side pain  And referred back to dr Donald [facility]ore practice    Please advise   And track the referral to NS  If that is what she is talking about  And see how to expedite help and assessment

## 2019-11-02 NOTE — Telephone Encounter (Signed)
I am not sure if anyone from Dr. Melven Sartorius office has contacted the patient. Please advise on how you want to proceed.

## 2019-11-06 NOTE — Telephone Encounter (Signed)
Pt called in to see if the MRI was placed for her L hip and she was given the information about the Neurosurgeon which was in the information that [name] had provided in the chart.

## 2019-11-07 NOTE — Telephone Encounter (Signed)
Pt is calling back because [facility], is Photographer and referral order fax to attn: Lee at [phone].

## 2019-11-08 ENCOUNTER — Other Ambulatory Visit: Payer: Self-pay

## 2019-11-08 ENCOUNTER — Other Ambulatory Visit (HOSPITAL_COMMUNITY): Payer: Self-pay

## 2019-11-08 ENCOUNTER — Encounter: Payer: Self-pay | Admitting: Primary Care

## 2019-11-08 ENCOUNTER — Ambulatory Visit (INDEPENDENT_AMBULATORY_CARE_PROVIDER_SITE_OTHER): Payer: Medicare HMO | Admitting: Primary Care

## 2019-11-08 VITALS — BP 130/64 | HR 60 | Temp 97.9°F | Ht 64.5 in | Wt 161.2 lb

## 2019-11-08 DIAGNOSIS — R05 Cough: Secondary | ICD-10-CM | POA: Diagnosis not present

## 2019-11-08 DIAGNOSIS — R1310 Dysphagia, unspecified: Secondary | ICD-10-CM

## 2019-11-08 DIAGNOSIS — M542 Cervicalgia: Secondary | ICD-10-CM

## 2019-11-08 DIAGNOSIS — R059 Cough, unspecified: Secondary | ICD-10-CM

## 2019-11-08 NOTE — Patient Instructions (Addendum)
Continues Pantoprazole 40mg  daily and Famotidine 20mg  at bedtime Continue Chlorpheniramine 4mg  tablet every 6 hours as needed for cough  Orders: Modifed barrium swallow re: dysphagia/cough Ultrasound soft tissues/neck re: neck pain/lymphadenopathy  Follow-up: 2 week virtual visit with Beth NP to review results (if results normal may consider inhaler for possible underlying asthma causing cough)    Aspiration Precautions, Adult Aspiration is the breathing in (inhalation) of a liquid or object into the lungs. Things that can be inhaled into the lungs include:  Food.  Any type of liquid, such as drinks or saliva.  Stomach contents, such as vomit or stomach acid. What are the signs of aspiration? Signs of aspiration include:  Coughing after swallowing food or liquids.  Clearing the throat often while eating.  Trouble breathing. This may include: ? Breathing quickly. ? Breathing very slowly. ? Loud breathing. ? Rumbling sounds from the lungs while breathing.  Coughing up phlegm (sputum) that: ? Is yellow, tan, or green. ? Has pieces of food in it. ? Is bad-smelling.  Having a hoarse, barky cough.  Not being able to speak.  A hoarse voice.  Drooling while eating.  A feeling of fullness in the throat or a feeling that something is stuck in the throat.  Choking often.  Having a runny noise while eating.  Coughing when lying down or having to sit up quickly after lying down.  A change in skin color. The skin may look red or blue.  Fever.  Watery eyes.  Pain in the chest or back.  A pained look on the face. What are the complications of aspiration? Complications of aspiration include:  Losing weight because the person is not absorbing needed nutrients.  Loss of enjoyment and the social benefits of eating.  Choking.  Lung irritation, if someone aspirates acidic food or drinks.  Lung infection (pneumonia).  Collection of infected liquid (pus) in the  lungs (lung abscess). In serious cases, death can occur. What can I do to prevent aspiration? Caring for someone who has a feeding tube If you are caring for someone who has a feeding tube who cannot eat or drink safely through his or her mouth:  Keep the person in an upright position as much as possible.  Do not lay the person flat if he or she is getting continuous feedings. If you need to lay the person flat for any reason, turn the feeding pump off.  Check feeding tube residuals as told by your health care provider. Ask your health care provider what residual amount is too high. Caring for someone who can eat and drink safely by mouth If you are caring for someone who can eat and drink safely through his or her mouth:  Have the person sit in an upright position when eating food or drinking fluids. This can be done in two ways: ? Have the person sit up in a chair. ? If sitting in a chair is not possible, position the person in bed so he or she is upright.  Remind the person to eat slowly and chew well. Make sure the person is awake and alert while eating.  Do not distract the person. This is especially important for people with thinking or memory (cognitive) problems.  Allow foods to cool. Hot foods may be more difficult to swallow.  Provide small meals more frequently, instead of 3 large meals. This may reduce fatigue during eating.  Check the person's mouth thoroughly for leftover food after eating.  Keep the  person sitting upright for 30-45 minutes after eating.  Do not serve food or drink during 2 hours or more before bedtime. General instructions Follow these general guidelines to prevent aspiration in someone who can eat and drink safely by mouth:  Never put food or liquids in the mouth of a person who is not fully alert.  Feed small amounts of food. Do not force feed.  For a person who is on a diet for swallowing difficulty (dysphagia diet), follow the recommended food  and drink consistency. For example, in dysphagia diet level 1, thicken liquids to pudding-like consistency.  Use as little water as possible when brushing the person's teeth or cleaning his or her mouth.  Provide oral care before and after meals.  Use adaptive devices such as cut-out cups, straws, or utensils as told by the health care provider.  Crush pills and put them in soft food such as pudding or ice cream. Some pills should not be crushed. Check with the health care provider before crushing any medicine. Contact a health care provider if:  The person has a feeding tube, and the feeding tube residual amount is too high.  The person has a fever.  The person tries to avoid food or water, such as refusing to eat, drink, or be fed, or is eating less than normal.  The person may have aspirated food or liquid.  You notice warning signs, such as choking or coughing, when the person eats or drinks. Get help right away if:  The person has trouble breathing or starts to breathe quickly.  The person is breathing very slowly or stops breathing.  The person coughs a lot after eating or drinking.  The person has a long-lasting (chronic) cough.  The person coughs up thick, yellow, or tan sputum.  If someone is choking on food or an object, perform the Heimlich maneuver (abdominal thrusts).  The person has symptoms of pneumonia, such as: ? Coughing a lot. ? Coughing up mucus with a bad smell or blood in it. ? Feeling short of breath. ? Complaining of chest pain. ? Sweating, fever, and chills. ? Feeling tired. ? Complaining of trouble breathing. ? Wheezing.  The person cannot stop choking.  The person is unable to breathe, turns blue, faints, or seems confused. These symptoms may represent a serious problem that is an emergency. Do not wait to see if the symptoms will go away. Get medical help right away. Call your local emergency services (911 in the [place]   Summary  Aspiration is the breathing in (inhalation) of a liquid or object into the lungs. Things that can be inhaled into the lungs include food, liquids, saliva, or stomach contents.  Aspiration can cause pneumonia or choking.  One sign of aspiration is coughing after swallowing food or liquids.  Contact a health care provider if you notice signs of aspiration. This information is not intended to replace advice given to you by your health care provider. Make sure you discuss any questions you have with your health care provider. Document Revised: 03/18/2017 Document Reviewed: 01/01/2016 Elsevier Patient Education  [employer].

## 2019-11-08 NOTE — Progress Notes (Signed)
@Patient  ID: [name], female    DOB: 02/18/1930, 86 y.o.   MRN: [id number]  Chief Complaint  Patient presents with  . Follow-up    sob,"coughing spells" when eating,rare wheeze    Referring provider: Isaac Bliss, Est[place]*  HPI: 86 year old female, never smoked. PMH significant for HTN, heart failure, CKD, pulmonary nodules. Patient of Dr. Melvyn Novas, last seen on 09/27/19. She is on gabapentin 300mg  TID. Started on Pantoprazole 40mg  daily and Famotide 20mg  at bedtime. Chlorpheniramine 4mg  as needed for cough  11/08/2019 Patient presents today for a 1 month follow-up visit for chronic cough.  She reports some improvement with addition of PPI and H2 blocker.  States that she mostly coughs with eating and drinking.  Coughing fits are embarrassing.  She does feel food gets stuck in her throat and possible that she is aspirating some.  She also has associated neck pain that been ongoing more so on the left side.  She appreciates night sweats and intermittent wheezing.  Denies fever, shortness of breath, chest pain, nausea vomiting diarrhea.   Allergies  Allergen Reactions  . Amitriptyline Hcl     REACTION: hallucinations  . Carisoprodol     REACTION: nightmares,rash,tachycardia  . Codeine     REACTION: "deathly ill" nauesa, dizzy,weak  . Fosamax [Alendronate Sodium] Other (See Comments)    Dental issues  . Meloxicam     REACTION: sensitive to sun; facial redness; may cause severe abd pain  . Metronidazole     REACTION: abd pain  . Nortriptyline Hcl     REACTION: hallucinations  . Oxaprozin     REACTION: abd pain  . Promethazine Hcl     REACTION: spastic arms and legs flailing while awake and unusual behavior while sleep - walking all over house during night  . Propranolol Other (See Comments)    hallucinations  . Rofecoxib     REACTION: rash  . Sumatriptan     REACTION: abdominal pain, blurry vision, drowsiness  . Tramadol Other (See Comments)    Dizziness and  constipation  . Venlafaxine     Hallucinations     Immunization History  Administered Date(s) Administered  . Fluad Quad(high Dose 65+) 12/25/2018  . Influenza Split 12/23/2013, 01/05/2017, 12/26/2017  . Influenza Whole 03/19/2006, 02/03/2007, 04/29/2008, 12/21/2008  . Influenza, High Dose Seasonal PF 12/18/2012  . Influenza-Unspecified 12/18/2013, 12/19/2014, 01/02/2016  . Moderna SARS-COVID-2 Vaccination 08/06/2019, 08/20/2019  . Pneumococcal Conjugate-13 04/27/2014, 04/17/2015  . Pneumococcal Polysaccharide-23 01/18/2003, 01/18/2008  . Td 01/18/2003  . Tdap 12/20/2012  . Zoster 02/17/2006    Past Medical History:  Diagnosis Date  . Anxiety and depression   . Back pain    lumbar  . Fecal incontinence    Dr. Michail Sermon, likely due to loose stools in conjunction with pelvic muscle laxity from aging, recommended to stop Miralax  . GI (gastrointestinal bleed)    hx of colon polyps 2004, diverticulosis, internal hemmorhids  . Headache   . Headache(784.0)   . Hydronephrosis    Right 2008 s/p urology eval CT/cysto: observation/monitor  . Hyperlipidemia   . Hypertension   . Knee pain    right chronic  . Migraine    on topamax , per neuro  . Osteoporosis   . Pulmonary nodules    [facility]. First seen by CT only 05/04/03, no change on f/u 08/08/06 -Macroscopic changes rml 04/27/07 > resolved april 6,2010 no further w/u   . Seborrheic keratosis    Left inferior posterior flank  .  SVT (supraventricular tachycardia) ([place])    sees Dr.Ross    Tobacco History: Social History   Tobacco Use  Smoking Status Never Smoker  Smokeless Tobacco Never Used   Counseling given: Not Answered   Outpatient Medications Prior to Visit  Medication Sig Dispense Refill  . acetaminophen (TYLENOL) 325 MG tablet Take 650 mg by mouth every 6 (six) hours as needed.    Marland Kitchen atorvastatin (LIPITOR) 40 MG tablet TAKE 1 TABLET BY MOUTH EVERYDAY AT BEDTIME 90 tablet 1  . Bismuth Subsalicylate (PEPTO-BISMOL PO)  Take 1 tablet by mouth daily as needed.     . Ca [id number] (CALCIUM-FOLIC ACID PLUS D PO) Take by mouth daily.    . celecoxib (CELEBREX) 200 MG capsule TAKE 1 CAPSULE (200 MG TOTAL) BY MOUTH DAILY AS NEEDED FOR MODERATE PAIN. 90 capsule 1  . CRANBERRY PO Take 1 capsule by mouth daily.    . cyclobenzaprine (FLEXERIL) 10 MG tablet Take 1 tablet (10 mg total) by mouth 3 (three) times daily as needed for muscle spasms. 30 tablet 2  . dextromethorphan-guaiFENesin (MUCINEX DM) 30-600 MG 12hr tablet Take 1 tablet by mouth 2 (two) times daily as needed for cough.    . escitalopram (LEXAPRO) 10 MG tablet TAKE 1 TABLET BY MOUTH EVERY DAY 90 tablet 0  . famotidine (PEPCID) 20 MG tablet TAKE 1 TABLET BY MOUTH EVERY DAY 30 tablet 4  . famotidine-calcium carbonate-magnesium hydroxide (PEPCID COMPLETE) 10-800-165 MG chewable tablet Chew 1 tablet by mouth daily as needed.    . furosemide (LASIX) 20 MG tablet furosemide 20 mg tablet  TAKE 1 TABLET BY MOUTH EVERY DAY    . gabapentin (NEURONTIN) 100 MG capsule Take 2-3 capsules (200-300 mg total) by mouth 3 (three) times daily. 810 capsule 1  . Liniments (BLUE-EMU SUPER STRENGTH) CREA Apply 1 application topically at bedtime as needed (dry skin).     Marland Kitchen loperamide (IMODIUM A-D) 2 MG tablet Take 2 mg by mouth 4 (four) times daily as needed for diarrhea or loose stools.    . NON FORMULARY Take 2 capsules by mouth 2 (two) times daily. Hydro Eye    . olopatadine (PATANOL) 0.1 % ophthalmic solution Place 1 drop into both eyes 2 (two) times daily. As directed    . pantoprazole (PROTONIX) 40 MG tablet Take 1 tablet (40 mg total) by mouth daily. Take 30-60 min before first meal of the day 30 tablet 2  . Phenylephrine-Acetaminophen (SINUS PRESSURE + PAIN) 5-325 MG TABS Take by mouth as directed.    . Potassium Chloride ER 20 MEQ TBCR Take 1 tablet by mouth daily. 90 tablet 0  . Probiotic Product (PROBIOTIC DAILY PO) Take 1 capsule by mouth daily.     .  Pumpkin Seed-Soy Germ (AZO BLADDER CONTROL/GO-LESS) CAPS Take by mouth.    . simethicone (GAS-X) 80 MG chewable tablet Chew 80 mg by mouth every 6 (six) hours as needed for flatulence.    Marland Kitchen amLODipine (NORVASC) 5 MG tablet Take 1 tablet (5 mg total) by mouth daily. 90 tablet 1   No facility-administered medications prior to visit.    Review of Systems  Review of Systems  Constitutional: Positive for diaphoresis. Negative for fever.  Respiratory: Positive for cough and wheezing.     Physical Exam  BP (!) 130/64 (BP Location: Right Arm, Cuff Size: Normal)   Pulse 60   Temp 97.9 F (36.6 C) (Oral)   Ht 5' 4.5" (1.638 m)   Wt 161 lb 3.2 oz (  73.1 kg)   SpO2 98%   BMI 27.24 kg/m  Physical Exam Constitutional:      Appearance: Normal appearance.  HENT:     Head: Normocephalic and atraumatic.     Mouth/Throat:     Mouth: Mucous membranes are moist.     Pharynx: Oropharynx is clear.  Cardiovascular:     Rate and Rhythm: Normal rate and regular rhythm.  Pulmonary:     Effort: Pulmonary effort is normal.     Breath sounds: Wheezing present.     Comments: Exp wheeze right lower base Musculoskeletal:     Cervical back: Tenderness present.  Lymphadenopathy:     Cervical: Cervical adenopathy present.  Skin:    General: Skin is warm and dry.  Neurological:     General: No focal deficit present.     Mental Status: She is alert and oriented to person, place, and time. Mental status is at baseline.  Psychiatric:        Mood and Affect: Mood normal.        Behavior: Behavior normal.        Thought Content: Thought content normal.        Judgment: Judgment normal.      Lab Results:  CBC    Component Value Date/Time   WBC 7.2 09/27/2019 1056   RBC 3.90 09/27/2019 1056   HGB 12.2 09/27/2019 1056   HCT 36.3 09/27/2019 1056   PLT 288.0 09/27/2019 1056   MCV 93.2 09/27/2019 1056   MCH 32.7 08/07/2018 1855   MCHC 33.5 09/27/2019 1056   RDW 15.2 09/27/2019 1056   LYMPHSABS  1.3 09/27/2019 1056   MONOABS 0.8 09/27/2019 1056   EOSABS 0.3 09/27/2019 1056   BASOSABS 0.1 09/27/2019 1056    BMET    Component Value Date/Time   NA 136 09/27/2019 1056   NA 141 04/30/2019 1111   K 4.5 09/27/2019 1056   CL 101 09/27/2019 1056   CO2 29 09/27/2019 1056   GLUCOSE 96 09/27/2019 1056   GLUCOSE 95 02/10/2006 1320   BUN 19 09/27/2019 1056   BUN 14 04/30/2019 1111   CREATININE 0.85 09/27/2019 1056   CALCIUM 10.3 09/27/2019 1056   GFRNONAA 61 04/30/2019 1111   GFRAA 70 04/30/2019 1111    BNP No results found for: BNP  ProBNP    Component Value Date/Time   PROBNP 26.0 09/27/2019 1056    Imaging: No results found.   Assessment & Plan:   Cough - Patient has cough which is worse when she eats/drinks. She has faint wheezing on exam. Concern for possible aspiration.  - Continue Pantoprazole 40mg  daily and Famotidine 20mg  at bedtime. Chlorpheniramine 4mg  tablet every 6 hours as needed for cough - Orders: Modifed barrium swallow re: dysphagia/cough - Follow-up: 2 week virtual visit with Beth NP to review results (if results normal may consider inhaler for possible underlying asthma causing cough)  Neck pain - Patient has neck pain L>R. Tender to touch, mild lymphadenopathy. Associated night sweats.  - Orders: Ultrasound soft tissue head/neck  - Continue physical therapy exercises    Martyn Ehrich, NP 11/08/2019

## 2019-11-08 NOTE — Assessment & Plan Note (Signed)
-   Patient has cough which is worse when she eats/drinks. She has faint wheezing on exam. Concern for possible aspiration.  - Continue Pantoprazole 40mg  daily and Famotidine 20mg  at bedtime. Chlorpheniramine 4mg  tablet every 6 hours as needed for cough - Orders: Modifed barrium swallow re: dysphagia/cough - Follow-up: 2 week virtual visit with Beth NP to review results (if results normal may consider inhaler for possible underlying asthma causing cough)

## 2019-11-08 NOTE — Assessment & Plan Note (Addendum)
-   Patient has neck pain L>R. Tender to touch, mild lymphadenopathy. Associated night sweats.  - Orders: Ultrasound soft tissue head/neck  - Continue physical therapy exercises

## 2019-11-13 ENCOUNTER — Ambulatory Visit (INDEPENDENT_AMBULATORY_CARE_PROVIDER_SITE_OTHER): Payer: Medicare HMO | Admitting: Neurology

## 2019-11-13 ENCOUNTER — Encounter: Payer: Self-pay | Admitting: Neurology

## 2019-11-13 ENCOUNTER — Other Ambulatory Visit: Payer: Self-pay

## 2019-11-13 VITALS — BP 152/81 | HR 69 | Ht 64.0 in | Wt 159.0 lb

## 2019-11-13 DIAGNOSIS — G3184 Mild cognitive impairment, so stated: Secondary | ICD-10-CM

## 2019-11-13 NOTE — Progress Notes (Signed)
NEUROLOGY CONSULTATION NOTE  [name] MRN: [id number] DOB: Jan 14, 1930  Referring provider: Dr. Lelon Frohlich Primary care provider: Dr. Lelon Frohlich  Reason for consult:  Memory loss  Dear Dr Deniece Ree:  Thank you for your kind referral of [name] for consultation of the above symptoms. Although her history is well known to you, please allow me to reiterate it for the purpose of our medical record. She is alone in the office today. Records and images were personally reviewed where available.   HISTORY OF PRESENT ILLNESS: This is a pleasant 86 year old right-handed woman with a history of hypertension, hyperlipidemia, SVT, migraines, anxiety, depression, presenting for evaluation of memory loss. She lives at [facility] and has been going to [facility] for her memory and falls and feels better. She states her memory is not as good as she wishes. She started noticing memory changes around 5 years ago, she would forget where her fridge was while she was in the kitchen. She continues to manage her medications and finances and denies any issues. She states her son wanted her to turn it over to him but she refused, "he disclaimed me and is not calling me anymore." She does not have a car. She reports he would not take her to the closing of her home and he told some of her doctors one day that he could say she was failing and he needed to take charge of her health, then told the managers at [facility] that he was through with her. She moved to [place] in December 2020. She has a sister-in-law and nephew who keep an eye on her and call her every few days. Her niece in [place] comes once a year, they talk on the phone and has told her she could not remember some things. She reports troubling hallucinations, but she figured out herself that it was the Propranolol and after stopping it several months ago, the hallucinations stopped. She was  having auditory>visual hallucinations, hearing people in the living room, she thought she had been in a meeting with some of them. One night she needed to use the bathroom and saw 2 women sitting on a bench at the foot of her bed talking to each other. There is a family history of dementia in her younger deceased brother, maternal aunt, and cousin.   She has occasional migraines. She has left hip pain. She fell in 2019 and continues to have pain in the back of her head and neck. She denies any dizziness, diplopia, dysarthria/dysphagia, focal numbness/tingling/weakness, bowel/bladder dysfunction, anosmia, or tremors. She has done PT at 3 different places, working on her balance. She uses her walker or cane most of the time, but comes today with no assistive devices. No recent falls.   I personally reviewed head CT without contrast done 09/2018 with no acute changes, there was age-related diffuse atrophy and chronic microvascular disease.   Laboratory Data: Lab Results  Component Value Date   TSH 2.15 09/27/2019   Lab Results  Component Value Date   VITAMINB12 256 11/23/2018     PAST MEDICAL HISTORY: Past Medical History:  Diagnosis Date  . Anxiety and depression   . Back pain    lumbar  . Fecal incontinence    Dr. Michail Sermon, likely due to loose stools in conjunction with pelvic muscle laxity from aging, recommended to stop Miralax  . GI (gastrointestinal bleed)    hx of colon polyps 2004, diverticulosis, internal hemmorhids  .  Headache   . Headache(784.0)   . Hydronephrosis    Right 2008 s/p urology eval CT/cysto: observation/monitor  . Hyperlipidemia   . Hypertension   . Knee pain    right chronic  . Migraine    on topamax , per neuro  . Osteoporosis   . Pulmonary nodules    [facility]. First seen by CT only 05/04/03, no change on f/u 08/08/06 -Macroscopic changes rml 04/27/07 > resolved april 6,2010 no further w/u   . Seborrheic keratosis    Left inferior posterior flank  . SVT  (supraventricular tachycardia) ([place])    sees Dr.Ross    PAST SURGICAL HISTORY: Past Surgical History:  Procedure Laterality Date  . ABDOMINAL HYSTERECTOMY    . APPENDECTOMY    . BASAL CELL CARCINOMA EXCISION    . BLADDER REPAIR    . CATARACT EXTRACTION  2008   Dr.Digby  . CHOLECYSTECTOMY    . lumbar reconstructive surgery  01/07/2009  . R ovary removed    . ROTATOR CUFF REPAIR  9/-2011  . SHOULDER SURGERY  12-2011   LEFT  . TONSILLECTOMY    . TOTAL KNEE ARTHROPLASTY     right  . trigger finger surgery     L thumb (2014), R ring finger 05-2013    MEDICATIONS: Current Outpatient Medications on File Prior to Visit  Medication Sig Dispense Refill  . acetaminophen (TYLENOL) 325 MG tablet Take 650 mg by mouth every 6 (six) hours as needed.    Marland Kitchen atorvastatin (LIPITOR) 40 MG tablet TAKE 1 TABLET BY MOUTH EVERYDAY AT BEDTIME 90 tablet 1  . Bismuth Subsalicylate (PEPTO-BISMOL PO) Take 1 tablet by mouth daily as needed.     . Ca [id number] (CALCIUM-FOLIC ACID PLUS D PO) Take by mouth daily.    . celecoxib (CELEBREX) 200 MG capsule TAKE 1 CAPSULE (200 MG TOTAL) BY MOUTH DAILY AS NEEDED FOR MODERATE PAIN. 90 capsule 1  . CRANBERRY PO Take 1 capsule by mouth daily.    . cyclobenzaprine (FLEXERIL) 10 MG tablet Take 1 tablet (10 mg total) by mouth 3 (three) times daily as needed for muscle spasms. 30 tablet 2  . dextromethorphan-guaiFENesin (MUCINEX DM) 30-600 MG 12hr tablet Take 1 tablet by mouth 2 (two) times daily as needed for cough.    . escitalopram (LEXAPRO) 10 MG tablet TAKE 1 TABLET BY MOUTH EVERY DAY 90 tablet 0  . famotidine (PEPCID) 20 MG tablet TAKE 1 TABLET BY MOUTH EVERY DAY 30 tablet 4  . gabapentin (NEURONTIN) 100 MG capsule Take 2-3 capsules (200-300 mg total) by mouth 3 (three) times daily. 810 capsule 1  . Liniments (BLUE-EMU SUPER STRENGTH) CREA Apply 1 application topically at bedtime as needed (dry skin).     . NON FORMULARY Take 2 capsules by mouth 2 (two)  times daily. Hydro Eye    . olopatadine (PATANOL) 0.1 % ophthalmic solution Place 1 drop into both eyes 2 (two) times daily. As directed    . pantoprazole (PROTONIX) 40 MG tablet Take 1 tablet (40 mg total) by mouth daily. Take 30-60 min before first meal of the day 30 tablet 2  . Phenylephrine-Acetaminophen (SINUS PRESSURE + PAIN) 5-325 MG TABS Take by mouth as directed.    . Potassium Chloride ER 20 MEQ TBCR Take 1 tablet by mouth daily. 90 tablet 0  . Probiotic Product (PROBIOTIC DAILY PO) Take 1 capsule by mouth daily.     . Pumpkin Seed-Soy Germ (AZO BLADDER CONTROL/GO-LESS) CAPS Take by mouth.    Marland Kitchen  simethicone (GAS-X) 80 MG chewable tablet Chew 80 mg by mouth every 6 (six) hours as needed for flatulence.    Marland Kitchen amLODipine (NORVASC) 5 MG tablet Take 1 tablet (5 mg total) by mouth daily. 90 tablet 1  . famotidine-calcium carbonate-magnesium hydroxide (PEPCID COMPLETE) 10-800-165 MG chewable tablet Chew 1 tablet by mouth daily as needed. (Patient not taking: Reported on 11/13/2019)    . furosemide (LASIX) 20 MG tablet furosemide 20 mg tablet  TAKE 1 TABLET BY MOUTH EVERY DAY (Patient not taking: Reported on 11/13/2019)    . loperamide (IMODIUM A-D) 2 MG tablet Take 2 mg by mouth 4 (four) times daily as needed for diarrhea or loose stools. (Patient not taking: Reported on 11/13/2019)     No current facility-administered medications on file prior to visit.    ALLERGIES: Allergies  Allergen Reactions  . Amitriptyline Hcl     REACTION: hallucinations  . Carisoprodol     REACTION: nightmares,rash,tachycardia  . Codeine     REACTION: "deathly ill" nauesa, dizzy,weak  . Fosamax [Alendronate Sodium] Other (See Comments)    Dental issues  . Meloxicam     REACTION: sensitive to sun; facial redness; may cause severe abd pain  . Metronidazole     REACTION: abd pain  . Nortriptyline Hcl     REACTION: hallucinations  . Oxaprozin     REACTION: abd pain  . Promethazine Hcl     REACTION: spastic  arms and legs flailing while awake and unusual behavior while sleep - walking all over house during night  . Propranolol Other (See Comments)    hallucinations  . Rofecoxib     REACTION: rash  . Sumatriptan     REACTION: abdominal pain, blurry vision, drowsiness  . Tramadol Other (See Comments)    Dizziness and constipation  . Venlafaxine     Hallucinations     FAMILY HISTORY: Family History  Problem Relation Age of Onset  . Breast cancer Mother        later in life  . Diabetes Mother        late in life  . Heart disease Mother        late onset  . Lung cancer Father        apparently had TB  . Heart disease Father        late onset  . Prostate cancer Father   . Colitis Brother   . Cancer Brother   . Colon cancer Neg Hx     SOCIAL HISTORY: Social History   Socioeconomic History  . Marital status: Widowed    Spouse name: Not on file  . Number of children: 1  . Years of education: college   . Highest education level: Doctorate  Occupational History  . Occupation: retired   Tobacco Use  . Smoking status: Never Smoker  . Smokeless tobacco: Never Used  Vaping Use  . Vaping Use: Never used  Substance and Sexual Activity  . Alcohol use: Yes    Alcohol/week: 1.0 standard drink    Types: 1 Glasses of wine per week    Comment: rarely, 1 glass of wine monthly  . Drug use: No  . Sexual activity: Not Currently  Other Topics Concern  . Not on file  Social History Narrative   Moved from a retirement home to a condominium 04-2013, independent living,  lives by herself , drives    Son lives in [place]   Right handed    1 cup of caffeine  per day    Social Determinants of Health   Financial Resource Strain:   . Difficulty of Paying Living Expenses:   Food Insecurity:   . Worried About Charity fundraiser in the Last Year:   . Arboriculturist in the Last Year:   Transportation Needs:   . Film/video editor (Medical):   Marland Kitchen Lack of Transportation (Non-Medical):    Physical Activity:   . Days of Exercise per Week:   . Minutes of Exercise per Session:   Stress:   . Feeling of Stress :   Social Connections:   . Frequency of Communication with Friends and Family:   . Frequency of Social Gatherings with Friends and Family:   . Attends Religious Services:   . Active Member of Clubs or Organizations:   . Attends Archivist Meetings:   Marland Kitchen Marital Status:   Intimate Partner Violence:   . Fear of Current or Ex-Partner:   . Emotionally Abused:   Marland Kitchen Physically Abused:   . Sexually Abused:      PHYSICAL EXAM: Vitals:   11/13/19 1029  BP: (!) 152/81  Pulse: 69  SpO2: 96%   General: No acute distress Head:  Normocephalic/atraumatic Skin/Extremities: No rash, no edema Neurological Exam: Mental status: alert and oriented to person, place, and time, no dysarthria or aphasia, Fund of knowledge is appropriate.  Recent and remote memory are impaired.  Attention and concentration are normal. SLUMS 24/30 [employer] Mental Exam 11/13/2019  Weekday Correct 1  Current year 1  What state are we in? 1  Amount spent 1  Amount left 2  # of Animals 3  5 objects recall 3  Number series 0  Hour markers 2  Time correct 2  Placed X in triangle correctly 1  Largest Figure 1  Name of female 2  Date back to work 2  Type of work [employer] she lived in 2  Total score 24    Cranial nerves: CN I: not tested CN II: pupils equal, round and reactive to light, visual fields intact CN III, IV, VI:  full range of motion, no nystagmus, no ptosis CN V: facial sensation intact CN VII: upper and lower face symmetric CN VIII: hearing intact to conversation Bulk & Tone: normal, no fasciculations. Motor: 5/5 throughout with no pronator drift. Sensation: intact to light touch, cold, pin, vibration sense.  No extinction to double simultaneous stimulation.  Romberg test negative Deep Tendon Reflexes: +2 both UE, +1 both LE Cerebellar: no incoordination  on finger to nose testing Gait: narrow-based and steady, no ataxia Tremor: none   IMPRESSION: This is a pleasant 86 year old right-handed woman with a history of hypertension, hyperlipidemia, SVT, migraines, anxiety, depression, presenting for evaluation of memory loss. Her neurological exam is non-focal, SLUMS score 24/30. She is alone in the office today with no family to corroborate history, and currently denies any difficulties with complex tasks, indicating Mild Cognitive Impairment. MRI brain without contrast will be ordered to assess for underlying structural abnormality. Neurocognitive testing will be done to further assess cognitive concerns, with her report of son "disclaiming" her, concern is raised for cognitive concerns not noted/reported on today's visit. We may start cholinesterase inhibitors depending on test results. She does not drive. Follow-up in 6 months, she knows to call for any changes.   Thank you for allowing me to participate in the care of this patient. Please do not hesitate to call for any questions  or concerns.   Ellouise Newer, M.D.  CC: Dr. Deniece Ree

## 2019-11-13 NOTE — Patient Instructions (Addendum)
Good to meet you!  1. Schedule MRI brain without contrast  2. Schedule Neurocognitive testing  3. Depending on results of tests, we may start medication, our office will call once we get all the results  4. Follow-up in 6 months, call for any changes   RECOMMENDATIONS FOR ALL PATIENTS WITH MEMORY PROBLEMS: 1. Continue to exercise (Recommend 30 minutes of walking everyday, or 3 hours every week) 2. Increase social interactions - continue going to [place] and enjoy social gatherings with friends and family 3. Eat healthy, avoid fried foods and eat more fruits and vegetables 4. Maintain adequate blood pressure, blood sugar, and blood cholesterol level. Reducing the risk of stroke and cardiovascular disease also helps promoting better memory. 5. Avoid stressful situations. Live a simple life and avoid aggravations. Organize your time and prepare for the next day in anticipation. 6. Sleep well, avoid any interruptions of sleep and avoid any distractions in the bedroom that may interfere with adequate sleep quality 7. Avoid sugar, avoid sweets as there is a strong link between excessive sugar intake, diabetes, and cognitive impairment The Mediterranean diet has been shown to help patients reduce the risk of progressive memory disorders and reduces cardiovascular risk. This includes eating fish, eat fruits and green leafy vegetables, nuts like almonds and hazelnuts, walnuts, and also use olive oil. Avoid fast foods and fried foods as much as possible. Avoid sweets and sugar as sugar use has been linked to worsening of memory function.

## 2019-11-14 ENCOUNTER — Ambulatory Visit (HOSPITAL_COMMUNITY): Admission: RE | Admit: 2019-11-14 | Payer: Medicare HMO | Source: Ambulatory Visit

## 2019-11-15 ENCOUNTER — Other Ambulatory Visit: Payer: Self-pay

## 2019-11-15 ENCOUNTER — Encounter: Payer: Self-pay | Admitting: Cardiology

## 2019-11-15 ENCOUNTER — Ambulatory Visit (INDEPENDENT_AMBULATORY_CARE_PROVIDER_SITE_OTHER): Payer: Medicare HMO | Admitting: Cardiology

## 2019-11-15 VITALS — BP 126/70 | HR 64 | Ht 64.0 in | Wt 159.6 lb

## 2019-11-15 DIAGNOSIS — I471 Supraventricular tachycardia: Secondary | ICD-10-CM

## 2019-11-15 DIAGNOSIS — I10 Essential (primary) hypertension: Secondary | ICD-10-CM

## 2019-11-15 DIAGNOSIS — E785 Hyperlipidemia, unspecified: Secondary | ICD-10-CM

## 2019-11-15 NOTE — Progress Notes (Signed)
Cardiology Office Note:    Date:  11/15/2019   ID:  [name], DOB Sep 06, 1929, MRN [id number]  PCP:  Isaac Bliss, Rayford Halsted, MD  [place] Cardiologist:  No primary care provider on file.  [facility] Electrophysiologist:  None   Referring MD: Isaac Bliss, [name]*     History of Present Illness:    [name] is a 86 y.o. female previously seen by Dr. Bettina Gavia here for new patient visit, SVT hypertension hyperlipidemia follow-up. Has had prior trouble with falls, electrolyte abnormalities, memory loss  Hypertension has been difficult to control.  Calcium channel blocker was added in May need another controlling agent such as ARB.  Lab work looked good in June 2021 with creatinine of 0.85 potassium 4.5 hemoglobin 12.2 LDL 72.   Past Medical History:  Diagnosis Date   Anxiety and depression    Back pain    lumbar   Fecal incontinence    Dr. Michail Sermon, likely due to loose stools in conjunction with pelvic muscle laxity from aging, recommended to stop Miralax   GI (gastrointestinal bleed)    hx of colon polyps 2004, diverticulosis, internal hemmorhids   Headache    Headache(784.0)    Hydronephrosis    Right 2008 s/p urology eval CT/cysto: observation/monitor   Hyperlipidemia    Hypertension    Knee pain    right chronic   Migraine    on topamax , per neuro   Osteoporosis    Pulmonary nodules    [place]. First seen by CT only 05/04/03, no change on f/u 08/08/06 -Macroscopic changes rml 04/27/07 > resolved april 6,2010 no further w/u    Seborrheic keratosis    Left inferior posterior flank   SVT (supraventricular tachycardia) ([place])    sees Dr.Ross    Past Surgical History:  Procedure Laterality Date   ABDOMINAL HYSTERECTOMY     APPENDECTOMY     BASAL CELL CARCINOMA EXCISION     BLADDER REPAIR     CATARACT EXTRACTION  2008   Dr.Digby   CHOLECYSTECTOMY     lumbar reconstructive surgery  01/07/2009   R ovary  removed     ROTATOR CUFF REPAIR  9/-2011   SHOULDER SURGERY  12-2011   LEFT   TONSILLECTOMY     TOTAL KNEE ARTHROPLASTY     right   trigger finger surgery     L thumb (2014), R ring finger 05-2013    Current Medications: Current Meds  Medication Sig   acetaminophen (TYLENOL) 325 MG tablet Take 650 mg by mouth every 6 (six) hours as needed.   amLODipine (NORVASC) 5 MG tablet Take 1 tablet (5 mg total) by mouth daily.   Ascorbic Acid (VITAMIN C) 500 MG CHEW Chew 500 mg by mouth daily. Chew two tablets once a day   atorvastatin (LIPITOR) 40 MG tablet TAKE 1 TABLET BY MOUTH EVERYDAY AT BEDTIME   Bismuth Subsalicylate (PEPTO-BISMOL PO) Take 1 tablet by mouth daily as needed.    Ca [id number] (CALCIUM-FOLIC ACID PLUS D PO) Take by mouth daily.   celecoxib (CELEBREX) 200 MG capsule TAKE 1 CAPSULE (200 MG TOTAL) BY MOUTH DAILY AS NEEDED FOR MODERATE PAIN.   CRANBERRY PO Take 1 capsule by mouth daily.   cyclobenzaprine (FLEXERIL) 10 MG tablet Take 1 tablet (10 mg total) by mouth 3 (three) times daily as needed for muscle spasms.   dextromethorphan-guaiFENesin (MUCINEX DM) 30-600 MG 12hr tablet Take 1 tablet by mouth 2 (two) times daily as needed for  cough.   escitalopram (LEXAPRO) 10 MG tablet TAKE 1 TABLET BY MOUTH EVERY DAY   famotidine (PEPCID) 20 MG tablet TAKE 1 TABLET BY MOUTH EVERY DAY   famotidine-calcium carbonate-magnesium hydroxide (PEPCID COMPLETE) 10-800-165 MG chewable tablet Chew 1 tablet by mouth daily as needed.    furosemide (LASIX) 20 MG tablet furosemide 20 mg tablet  TAKE 1 TABLET BY MOUTH EVERY DAY   gabapentin (NEURONTIN) 100 MG capsule Take 2-3 capsules (200-300 mg total) by mouth 3 (three) times daily.   Liniments (BLUE-EMU SUPER STRENGTH) CREA Apply 1 application topically at bedtime as needed (dry skin).    loperamide (IMODIUM A-D) 2 MG tablet Take 2 mg by mouth 4 (four) times daily as needed for diarrhea or loose stools.    NON  FORMULARY Take 2 capsules by mouth 2 (two) times daily. Hydro Eye   olopatadine (PATANOL) 0.1 % ophthalmic solution Place 1 drop into both eyes 2 (two) times daily. As directed   pantoprazole (PROTONIX) 40 MG tablet Take 1 tablet (40 mg total) by mouth daily. Take 30-60 min before first meal of the day   Phenylephrine-Acetaminophen (SINUS PRESSURE + PAIN) 5-325 MG TABS Take by mouth as directed.   Potassium Chloride ER 20 MEQ TBCR Take 1 tablet by mouth daily.   Probiotic Product (PROBIOTIC DAILY PO) Take 1 capsule by mouth daily.    Pumpkin Seed-Soy Germ (AZO BLADDER CONTROL/GO-LESS) CAPS Take by mouth.   simethicone (GAS-X) 80 MG chewable tablet Chew 80 mg by mouth every 6 (six) hours as needed for flatulence.     Allergies:   Amitriptyline hcl, Carisoprodol, Codeine, Fosamax [alendronate sodium], Meloxicam, Metronidazole, Nortriptyline hcl, Oxaprozin, Promethazine hcl, Propranolol, Rofecoxib, Sumatriptan, Tramadol, and Venlafaxine   Social History   Socioeconomic History   Marital status: Widowed    Spouse name: Not on file   Number of children: 1   Years of education: college    Highest education level: Doctorate  Occupational History   Occupation: retired   Tobacco Use   Smoking status: Never Smoker   Smokeless tobacco: Never Used  Scientific laboratory technician Use: Never used  Substance and Sexual Activity   Alcohol use: Yes    Alcohol/week: 1.0 standard drink    Types: 1 Glasses of wine per week    Comment: rarely, 1 glass of wine monthly   Drug use: No   Sexual activity: Not Currently  Other Topics Concern   Not on file  Social History Narrative   Moved from a retirement home to a condominium 04-2013, independent living,  lives by herself , drives    Son lives in [place]   Right handed    1 cup of caffeine per day    Social Determinants of Health   Financial Resource Strain:    Difficulty of Paying Living Expenses:   Food Insecurity:    Worried About  Charity fundraiser in the Last Year:    Arboriculturist in the Last Year:   Transportation Needs:    Film/video editor (Medical):    Lack of Transportation (Non-Medical):   Physical Activity:    Days of Exercise per Week:    Minutes of Exercise per Session:   Stress:    Feeling of Stress :   Social Connections:    Frequency of Communication with Friends and Family:    Frequency of Social Gatherings with Friends and Family:    Attends Religious Services:    Active Member of  Clubs or Organizations:    Attends Music therapist:    Marital Status:      Family History: The patient's family history includes Breast cancer in her mother; Cancer in her brother; Colitis in her brother; Diabetes in her mother; Heart disease in her father and mother; Lung cancer in her father; Prostate cancer in her father. There is no history of Colon cancer.  ROS:   Please see the history of present illness.    Denies any fevers chills nausea vomiting syncope bleeding all other systems reviewed and are negative.  EKGs/Labs/Other Studies Reviewed:    The following studies were reviewed today: Echo 05/02/18:   Study Conclusions - Left ventricle: The cavity size was normal. Wall thickness was normal. Systolic function was normal. The estimated ejection fraction was in the range of 55% to 60%. Wall motion was normal; there were no regional wall motion abnormalities. Doppler parameters are consistent with abnormal left ventricular relaxation (grade 1 diastolic dysfunction). - Mitral valve: Mildly calcified annulus. There was mild regurgitation.  EKG:  EKG is  ordered today.  The ekg ordered today demonstrates Sinus rhythm 70 no other abnormalities  Recent Labs: 09/27/2019: ALT 11; BUN 19; Creatinine, Ser 0.85; Hemoglobin 12.2; Platelets 288.0; Potassium 4.5; Pro B Natriuretic peptide (BNP) 26.0; Sodium 136; TSH 2.15  Recent Lipid Panel    Component Value  Date/Time   CHOL 146 04/30/2019 1111   TRIG 148 04/30/2019 1111   HDL 48 04/30/2019 1111   CHOLHDL 3.0 04/30/2019 1111   CHOLHDL 2 01/13/2018 1048   VLDL 28.2 01/13/2018 1048   LDLCALC 72 04/30/2019 1111   LDLDIRECT 78.0 12/20/2012 1148    Physical Exam:    VS:  BP 126/70    Pulse 64    Ht 5\' 4"  (1.626 m)    Wt 159 lb 9.6 oz (72.4 kg)    SpO2 96%    BMI 27.40 kg/m     Wt Readings from Last 3 Encounters:  11/15/19 159 lb 9.6 oz (72.4 kg)  11/13/19 159 lb (72.1 kg)  11/08/19 161 lb 3.2 oz (73.1 kg)     GEN:  Well nourished, well developed in no acute distress HEENT: Normal NECK: No JVD; No carotid bruits LYMPHATICS: No lymphadenopathy CARDIAC: RRR, no murmurs, rubs, gallops RESPIRATORY:  Clear to auscultation without rales, wheezing or rhonchi  ABDOMEN: Soft, non-tender, non-distended MUSCULOSKELETAL:  No edema; No deformity  SKIN: Warm and dry NEUROLOGIC:  Alert and oriented x 3 PSYCHIATRIC:  Normal affect   ASSESSMENT:    1. SVT (supraventricular tachycardia) ([place])   2. Essential hypertension   3. Hyperlipidemia, unspecified hyperlipidemia type    PLAN:    In order of problems listed above:  SVT -Stable.  Currently on no rate controlling medications.  Has been stable.  Takes a low-dose Lasix 20 mg daily.  Essential hypertension -Has been challenging to control.  On Norvasc 5 mg a day.  Today it is excellent.  Chronic lower extremity edema -Continue to utilize low-dose Lasix 20 mg a day  Hyperlipidemia -Excellent statin panel, liver function.  Fall -Continue to encourage movement.  Overall doing well.   Medication Adjustments/Labs and Tests Ordered: Current medicines are reviewed at length with the patient today.  Concerns regarding medicines are outlined above.  Orders Placed This Encounter  Procedures   EKG 12-Lead   No orders of the defined types were placed in this encounter.   Patient Instructions  Medication Instructions:  The current  medical  regimen is effective;  continue present plan and medications.  *If you need a refill on your cardiac medications before your next appointment, please call your pharmacy*  Follow-Up: At [facility], you and your health needs are our priority.  As part of our continuing mission to provide you with exceptional heart care, we have created designated Provider Care Teams.  These Care Teams include your primary Cardiologist (physician) and Advanced Practice Providers (APPs -  Physician Assistants and Nurse Practitioners) who all work together to provide you with the care you need, when you need it.  We recommend signing up for the patient portal called "MyChart".  Sign up information is provided on this After Visit Summary.  MyChart is used to connect with patients for Virtual Visits (Telemedicine).  Patients are able to view lab/test results, encounter notes, upcoming appointments, etc.  Non-urgent messages can be sent to your provider as well.   To learn more about what you can do with MyChart, go to [email or web address].    Your next appointment:   12 month(s)  The format for your next appointment:   In Person  Provider:   Candee Furbish, MD   Thank you for choosing [facility]!!        Signed, Candee Furbish, MD  11/15/2019 10:49 AM    [place]

## 2019-11-15 NOTE — Patient Instructions (Signed)
Medication Instructions:  The current medical regimen is effective;  continue present plan and medications.  *If you need a refill on your cardiac medications before your next appointment, please call your pharmacy*  Follow-Up: At [facility], you and your health needs are our priority.  As part of our continuing mission to provide you with exceptional heart care, we have created designated Provider Care Teams.  These Care Teams include your primary Cardiologist (physician) and Advanced Practice Providers (APPs -  Physician Assistants and Nurse Practitioners) who all work together to provide you with the care you need, when you need it.  We recommend signing up for the patient portal called "MyChart".  Sign up information is provided on this After Visit Summary.  MyChart is used to connect with patients for Virtual Visits (Telemedicine).  Patients are able to view lab/test results, encounter notes, upcoming appointments, etc.  Non-urgent messages can be sent to your provider as well.   To learn more about what you can do with MyChart, go to [email or web address].    Your next appointment:   12 month(s)  The format for your next appointment:   In Person  Provider:   Mark Skains, MD   Thank you for choosing [facility]!!      

## 2019-11-19 ENCOUNTER — Ambulatory Visit (HOSPITAL_COMMUNITY): Payer: Medicare HMO

## 2019-11-20 ENCOUNTER — Ambulatory Visit (HOSPITAL_COMMUNITY)
Admission: RE | Admit: 2019-11-20 | Discharge: 2019-11-20 | Disposition: A | Discharge status: Home / Self Care | Payer: Medicare HMO | Source: Ambulatory Visit | Attending: Primary Care | Admitting: Primary Care

## 2019-11-20 ENCOUNTER — Other Ambulatory Visit: Payer: Self-pay

## 2019-11-20 DIAGNOSIS — R05 Cough: Secondary | ICD-10-CM | POA: Diagnosis not present

## 2019-11-20 DIAGNOSIS — R590 Localized enlarged lymph nodes: Secondary | ICD-10-CM | POA: Diagnosis not present

## 2019-11-20 DIAGNOSIS — R1310 Dysphagia, unspecified: Secondary | ICD-10-CM | POA: Diagnosis not present

## 2019-11-20 DIAGNOSIS — T17300A Unspecified foreign body in larynx causing asphyxiation, initial encounter: Secondary | ICD-10-CM | POA: Diagnosis not present

## 2019-11-20 DIAGNOSIS — M542 Cervicalgia: Secondary | ICD-10-CM | POA: Insufficient documentation

## 2019-11-20 DIAGNOSIS — R059 Cough, unspecified: Secondary | ICD-10-CM

## 2019-11-20 DIAGNOSIS — J449 Chronic obstructive pulmonary disease, unspecified: Secondary | ICD-10-CM | POA: Diagnosis not present

## 2019-11-21 NOTE — Progress Notes (Signed)
She has an apt with me tomorrow I can discuss results

## 2019-11-21 NOTE — Progress Notes (Signed)
Please let patient know Ultrasound of her neck showed small soft tissue nodules mid neck. Most consistent with lymph nodes not frankly enlarged but mildy abnormal.   Please order CT with contrast soft tissue neck

## 2019-11-22 ENCOUNTER — Encounter: Payer: Self-pay | Admitting: Primary Care

## 2019-11-22 ENCOUNTER — Other Ambulatory Visit: Payer: Self-pay

## 2019-11-22 ENCOUNTER — Ambulatory Visit (INDEPENDENT_AMBULATORY_CARE_PROVIDER_SITE_OTHER): Payer: Medicare HMO | Admitting: Primary Care

## 2019-11-22 VITALS — BP 112/62 | HR 76 | Temp 98.2°F | Ht 64.5 in | Wt 158.4 lb

## 2019-11-22 DIAGNOSIS — L509 Urticaria, unspecified: Secondary | ICD-10-CM | POA: Diagnosis not present

## 2019-11-22 DIAGNOSIS — R05 Cough: Secondary | ICD-10-CM | POA: Diagnosis not present

## 2019-11-22 DIAGNOSIS — M542 Cervicalgia: Secondary | ICD-10-CM | POA: Diagnosis not present

## 2019-11-22 DIAGNOSIS — R059 Cough, unspecified: Secondary | ICD-10-CM

## 2019-11-22 MED ORDER — METHYLPREDNISOLONE ACETATE 80 MG/ML IJ SUSP
80.0000 mg | Freq: Once | INTRAMUSCULAR | Status: AC
  Administered 2019-11-22: 80 mg via INTRAMUSCULAR

## 2019-11-22 MED ORDER — PREDNISONE 10 MG PO TABS: ORAL_TABLET | ORAL | 0 refills | Status: DC

## 2019-11-22 MED ORDER — LORATADINE 10 MG PO TABS
10.0000 mg | ORAL_TABLET | Freq: Every day | ORAL | 3 refills | Status: DC
Start: 2019-11-22 — End: 2020-02-13

## 2019-11-22 MED ORDER — METHYLPREDNISOLONE ACETATE 80 MG/ML IJ SUSP: 80.0000 mg | Freq: Once | INTRAMUSCULAR | Status: DC

## 2019-11-22 NOTE — Assessment & Plan Note (Signed)
-   Cough has improved - She had a normal MBS, flash laryngeal penetration with thin liquids observed. Recommend small sips of liquids, use straw and perform chin tuck. - Ultrasound of her neck showed small hypoechoic soft tissue nodules in the mid neck bilaterally, most consistent with lymph nodes, not frankly enlarged but with mildly abnormal rounded shape as well as some suspected cortical thickening. - Ordering CT soft tissue neck wo contrast to evaluate soft tissue nodules

## 2019-11-22 NOTE — Patient Instructions (Addendum)
Pleasure seeing you today Ms Shankland  Hives: - Received Depo-medrol injection today in office - Sending in prednisone taper - Referring to dermatology - If you develop respiratory distress please call 911 for ED evaluation  Orders: - CT soft tissue neck (ordered)  Follow-up: - 3 months with Dr. Melvyn Novas

## 2019-11-22 NOTE — Addendum Note (Signed)
Addended by: Mathis Bud on: 11/22/2019 10:20 AM   Modules accepted: Orders

## 2019-11-22 NOTE — Assessment & Plan Note (Addendum)
-   Developed hives to forearms and lip swelling last night after dinner  - Received Depo-medrol injection today in office - Sending in prednisone taper (40mg  x 2 days; 30mg  x 2 days; 20mg  x 2 days; 10mg  x 2 days) - Start Loratadine 10mg  daily  - Referring to dermatology - If you develop respiratory distress please call 911 for ED evaluation

## 2019-11-22 NOTE — Addendum Note (Signed)
Addended by: Mathis Bud on: 11/22/2019 12:49 PM   Modules accepted: Orders

## 2019-11-22 NOTE — Progress Notes (Signed)
@Patient  ID: [name], female    DOB: 1929/10/18, 86 y.o.   MRN: [id number]  Chief Complaint  Patient presents with  . Follow-up    Review results-sob same, cough better,swallowing better, rash itchy upper forearms,right thigh area started last night-thinks r/t food last night,lips swollen    Referring provider: Isaac Bliss, [place]*  HPI: 86 year old female, never smoked. PMH significant for HTN, heart failure, CKD, pulmonary nodules. Patient of Dr. Melvyn Novas, last seen on 09/27/19. She is on gabapentin 300mg  TID. Started on Pantoprazole 40mg  daily and Famotide 20mg  at bedtime. Chlorpheniramine 4mg  as needed for cough  Previous [facility] pulmonary encounter: 11/08/2019 Patient presents today for a 1 month follow-up visit for chronic cough.  She reports some improvement with addition of PPI and H2 blocker.  States that she mostly coughs with eating and drinking.  Coughing fits are embarrassing.  She does feel food gets stuck in her throat and possible that she is aspirating some.  She also has associated neck pain that been ongoing more so on the left side.  She appreciates night sweats and intermittent wheezing.  Denies fever, shortness of breath, chest pain, nausea vomiting diarrhea.  11/22/2019 Patient presents today for 1-2 week follow-up visit for cough and neck pain. She had a normal MBS, flash laryngeal penetration with thin liquids observed. Recommend small sips of liquids, use straw and perform chin tuck. Ultrasound of her neck showed small hypoechoic soft tissue nodules in the mid neck bilaterally, most consistent with lymph nodes, not frankly enlarged but with mildly abnormal rounded shape as well as some suspected cortical thickening. Reports that her cough has improved some. She has been taking pantoprazole and famotidine as directed. These are not new medications per patient.   She developed hives to arms, groin and face last night after dinner. She reports eating salmon and  sauce. She denies fish or shellfish allergy. She thinks she may have had an allergic reaction to sauce. No new medication. She has been taking tylenol for back pain.    Allergies  Allergen Reactions  . Amitriptyline Hcl     REACTION: hallucinations  . Carisoprodol     REACTION: nightmares,rash,tachycardia  . Codeine     REACTION: "deathly ill" nauesa, dizzy,weak  . Fosamax [Alendronate Sodium] Other (See Comments)    Dental issues  . Meloxicam     REACTION: sensitive to sun; facial redness; may cause severe abd pain  . Metronidazole     REACTION: abd pain  . Nortriptyline Hcl     REACTION: hallucinations  . Oxaprozin     REACTION: abd pain  . Promethazine Hcl     REACTION: spastic arms and legs flailing while awake and unusual behavior while sleep - walking all over house during night  . Propranolol Other (See Comments)    hallucinations  . Rofecoxib     REACTION: rash  . Sumatriptan     REACTION: abdominal pain, blurry vision, drowsiness  . Tramadol Other (See Comments)    Dizziness and constipation  . Venlafaxine     Hallucinations     Immunization History  Administered Date(s) Administered  . Fluad Quad(high Dose 65+) 12/25/2018  . Influenza Split 12/23/2013, 01/05/2017, 12/26/2017  . Influenza Whole 03/19/2006, 02/03/2007, 04/29/2008, 12/21/2008  . Influenza, High Dose Seasonal PF 12/18/2012  . Influenza-Unspecified 12/18/2013, 12/19/2014, 01/02/2016  . Moderna SARS-COVID-2 Vaccination 08/06/2019, 08/20/2019  . Pneumococcal Conjugate-13 04/27/2014, 04/17/2015  . Pneumococcal Polysaccharide-23 01/18/2003, 01/18/2008  . Td 01/18/2003  . Tdap  12/20/2012  . Zoster 02/17/2006    Past Medical History:  Diagnosis Date  . Anxiety and depression   . Back pain    lumbar  . Fecal incontinence    Dr. Michail Sermon, likely due to loose stools in conjunction with pelvic muscle laxity from aging, recommended to stop Miralax  . GI (gastrointestinal bleed)    hx of colon  polyps 2004, diverticulosis, internal hemmorhids  . Headache   . Headache(784.0)   . Hydronephrosis    Right 2008 s/p urology eval CT/cysto: observation/monitor  . Hyperlipidemia   . Hypertension   . Knee pain    right chronic  . Migraine    on topamax , per neuro  . Osteoporosis   . Pulmonary nodules    [facility]. First seen by CT only 05/04/03, no change on f/u 08/08/06 -Macroscopic changes rml 04/27/07 > resolved april 6,2010 no further w/u   . Seborrheic keratosis    Left inferior posterior flank  . SVT (supraventricular tachycardia) ([place])    sees Dr.Ross    Tobacco History: Social History   Tobacco Use  Smoking Status Never Smoker  Smokeless Tobacco Never Used   Counseling given: Not Answered   Outpatient Medications Prior to Visit  Medication Sig Dispense Refill  . acetaminophen (TYLENOL) 325 MG tablet Take 650 mg by mouth every 6 (six) hours as needed.    . Ascorbic Acid (VITAMIN C) 500 MG CHEW Chew 500 mg by mouth daily. Chew two tablets once a day    . atorvastatin (LIPITOR) 40 MG tablet TAKE 1 TABLET BY MOUTH EVERYDAY AT BEDTIME 90 tablet 1  . Bismuth Subsalicylate (PEPTO-BISMOL PO) Take 1 tablet by mouth daily as needed.     . Ca [id number] (CALCIUM-FOLIC ACID PLUS D PO) Take by mouth daily.    . celecoxib (CELEBREX) 200 MG capsule TAKE 1 CAPSULE (200 MG TOTAL) BY MOUTH DAILY AS NEEDED FOR MODERATE PAIN. 90 capsule 1  . CRANBERRY PO Take 1 capsule by mouth daily.    . cyclobenzaprine (FLEXERIL) 10 MG tablet Take 1 tablet (10 mg total) by mouth 3 (three) times daily as needed for muscle spasms. 30 tablet 2  . dextromethorphan-guaiFENesin (MUCINEX DM) 30-600 MG 12hr tablet Take 1 tablet by mouth 2 (two) times daily as needed for cough.    . escitalopram (LEXAPRO) 10 MG tablet TAKE 1 TABLET BY MOUTH EVERY DAY 90 tablet 0  . famotidine (PEPCID) 20 MG tablet TAKE 1 TABLET BY MOUTH EVERY DAY 30 tablet 4  . furosemide (LASIX) 20 MG tablet furosemide 20 mg tablet   TAKE 1 TABLET BY MOUTH EVERY DAY    . gabapentin (NEURONTIN) 100 MG capsule Take 2-3 capsules (200-300 mg total) by mouth 3 (three) times daily. 810 capsule 1  . Liniments (BLUE-EMU SUPER STRENGTH) CREA Apply 1 application topically at bedtime as needed (dry skin).     . NON FORMULARY Take 2 capsules by mouth 2 (two) times daily. Hydro Eye    . olopatadine (PATANOL) 0.1 % ophthalmic solution Place 1 drop into both eyes 2 (two) times daily. As directed    . pantoprazole (PROTONIX) 40 MG tablet Take 1 tablet (40 mg total) by mouth daily. Take 30-60 min before first meal of the day 30 tablet 2  . Phenylephrine-Acetaminophen (SINUS PRESSURE + PAIN) 5-325 MG TABS Take by mouth as directed.    . Potassium Chloride ER 20 MEQ TBCR Take 1 tablet by mouth daily. 90 tablet 0  . Probiotic Product (PROBIOTIC  DAILY PO) Take 1 capsule by mouth daily.     . simethicone (GAS-X) 80 MG chewable tablet Chew 80 mg by mouth every 6 (six) hours as needed for flatulence.    Marland Kitchen amLODipine (NORVASC) 5 MG tablet Take 1 tablet (5 mg total) by mouth daily. 90 tablet 1  . famotidine-calcium carbonate-magnesium hydroxide (PEPCID COMPLETE) 10-800-165 MG chewable tablet Chew 1 tablet by mouth daily as needed.  (Patient not taking: Reported on 11/22/2019)    . loperamide (IMODIUM A-D) 2 MG tablet Take 2 mg by mouth 4 (four) times daily as needed for diarrhea or loose stools.  (Patient not taking: Reported on 11/22/2019)    . Pumpkin Seed-Soy Germ (AZO BLADDER CONTROL/GO-LESS) CAPS Take by mouth. (Patient not taking: Reported on 11/22/2019)     No facility-administered medications prior to visit.   Review of Systems  Review of Systems  Constitutional: Negative.   Respiratory: Positive for cough. Negative for chest tightness, shortness of breath and wheezing.   Skin: Positive for rash.       Urticaria to arms, swelling lips   Physical Exam  BP 112/62 (BP Location: Left Arm, Cuff Size: Normal)   Pulse 76   Temp 98.2 F (36.8 C)  (Oral)   Ht 5' 4.5" (1.638 m)   Wt 158 lb 6.4 oz (71.8 kg)   SpO2 94%   BMI 26.77 kg/m  Physical Exam Constitutional:      Appearance: Normal appearance.  HENT:     Head: Normocephalic and atraumatic.     Mouth/Throat:     Mouth: Mucous membranes are moist.     Pharynx: Oropharynx is clear.     Comments: Small amount of swelling to lower bottom lip  Cardiovascular:     Rate and Rhythm: Normal rate and regular rhythm.  Pulmonary:     Effort: Pulmonary effort is normal. No respiratory distress.     Breath sounds: No stridor. No wheezing.     Comments: Clear, no wheezing Skin:    Findings: Rash present.     Comments: Scattered urticaria to forearms  Neurological:     General: No focal deficit present.     Mental Status: She is alert and oriented to person, place, and time. Mental status is at baseline.  Psychiatric:        Mood and Affect: Mood normal.        Behavior: Behavior normal.        Thought Content: Thought content normal.        Judgment: Judgment normal.      Lab Results:  CBC    Component Value Date/Time   WBC 7.2 09/27/2019 1056   RBC 3.90 09/27/2019 1056   HGB 12.2 09/27/2019 1056   HCT 36.3 09/27/2019 1056   PLT 288.0 09/27/2019 1056   MCV 93.2 09/27/2019 1056   MCH 32.7 08/07/2018 1855   MCHC 33.5 09/27/2019 1056   RDW 15.2 09/27/2019 1056   LYMPHSABS 1.3 09/27/2019 1056   MONOABS 0.8 09/27/2019 1056   EOSABS 0.3 09/27/2019 1056   BASOSABS 0.1 09/27/2019 1056    BMET    Component Value Date/Time   NA 136 09/27/2019 1056   NA 141 04/30/2019 1111   K 4.5 09/27/2019 1056   CL 101 09/27/2019 1056   CO2 29 09/27/2019 1056   GLUCOSE 96 09/27/2019 1056   GLUCOSE 95 02/10/2006 1320   BUN 19 09/27/2019 1056   BUN 14 04/30/2019 1111   CREATININE 0.85 09/27/2019 1056  CALCIUM 10.3 09/27/2019 1056   GFRNONAA 61 04/30/2019 1111   GFRAA 70 04/30/2019 1111    BNP No results found for: BNP  ProBNP    Component Value Date/Time   PROBNP 26.0  09/27/2019 1056    Imaging: US SOFT TISSUE HEAD & NECK (NON-THYROID)  Result Date: 11/21/2019 CLINICAL DATA:  Bilateral neck pain since a fall 1 year ago. Left-sided lymphadenopathy. EXAM: ULTRASOUND OF HEAD/NECK SOFT TISSUES TECHNIQUE: Ultrasound examination of the head and neck soft tissues was performed in the area of clinical concern. COMPARISON:  Cervical spine CT 09/29/2018 FINDINGS: Small hypoechoic soft tissue nodules in the mid neck bilaterally measure 11 x 8 x 8 mm and 7 x 7 x 7 mm on the right and 10 x 6 x 9 mm on the left. These are most consistent with lymph nodes, not frankly enlarged but with mildly abnormal rounded shape as well as some suspected cortical thickening partially effacing the hila. No cystic changes are evident. These lymph nodes appear new or larger than on prior cervical spine CT. IMPRESSION: Bilateral cervical lymph nodes, overall small in size but with mildly abnormal morphology and of indeterminate etiology. Consider contrast-enhanced neck CT for further evaluation. Electronically Signed   By: Logan Bores M.D.   On: 11/21/2019 12:13   DG SWALLOW FUNC OP MEDICARE SPEECH PATH  Result Date: 11/20/2019 Objective Swallowing Evaluation: Type of Study: MBS-Modified Barium Swallow Study  Patient Details Name: [name] MRN: [id number] Date of Birth: [birth year] Today's Date: 11/20/2019 Time: 1210-1245 35 minutes Past Medical History: Past Medical History: Diagnosis Date . Anxiety and depression  . Back pain   lumbar . Fecal incontinence   Dr. Michail Sermon, likely due to loose stools in conjunction with pelvic muscle laxity from aging, recommended to stop Miralax . GI (gastrointestinal bleed)   hx of colon polyps 2004, diverticulosis, internal hemmorhids . Headache  . Headache(784.0)  . Hydronephrosis   Right 2008 s/p urology eval CT/cysto: observation/monitor . Hyperlipidemia  . Hypertension  . Knee pain   right chronic . Migraine   on topamax , per neuro . Osteoporosis  .  Pulmonary nodules   [facility]. First seen by CT only 05/04/03, no change on f/u 08/08/06 -Macroscopic changes rml 04/27/07 > resolved april 6,2010 no further w/u  . Seborrheic keratosis   Left inferior posterior flank . SVT (supraventricular tachycardia) ([facility])   sees Dr.Ross Past Surgical History: Past Surgical History: Procedure Laterality Date . ABDOMINAL HYSTERECTOMY   . APPENDECTOMY   . BASAL CELL CARCINOMA EXCISION   . BLADDER REPAIR   . CATARACT EXTRACTION  2008  Dr.Digby . CHOLECYSTECTOMY   . lumbar reconstructive surgery  01/07/2009 . R ovary removed   . ROTATOR CUFF REPAIR  9/-2011 . SHOULDER SURGERY  12-2011  LEFT . TONSILLECTOMY   . TOTAL KNEE ARTHROPLASTY    right . trigger finger surgery    L thumb (2014), R ring finger 05-2013 HPI: Pt is an 86 yo female referred for OP MBS.  Pt with PMH + for COPD, dementia, HLD, HTN, OEO, abnormal gait, deepression, migraines.  She is on Pepcid and Protonix.  Patient presents today for a 1 month follow-up visit for chronic cough.  She reports some improvement with addition of PPI and H2 blocker.  States that she mostly coughs with eating and drinking.  Coughing fits are embarrassing.  She does feel food gets stuck in her throat and possible that she is aspirating some.  She also has neck pain she  reports started after a fall.  Neck pain per MD notes, is more so on the left side.  She appreciates night sweats and intermittent wheezing per MD.  Denies fever, shortness of breath, chest pain, nausea vomiting diarrhea. Pt has been on pantoprazole 40 md and famotidine 20 mg bedtime since June 2021 and reports improvement in symptoms of coughing with intake and sensation of lodging in throat.  She denies needing heimlich manuever, weight loss due to dysphagia, nor pneumonias.  Pulmonary nodules seen 05/04/03, resolved by 07/23/2008.   She reports increased coughing with liquids more than foods.  C/O lump in the throat "globus" but reports improved with reflux medication.  Subjective: pt  awake in chair Assessment / Plan / Recommendation CHL IP CLINICAL IMPRESSIONS 11/20/2019 Clinical Impression Pt presents with functional oropharyngeal swallow ability.  No aspiration and only occasional flash laryngeal penetration with thin observed *WFL.  Pharyngeal swallow is strong and timely without residuals.  Of note, pt did clear her throat before the MBS and coughed during MBS but there was no barium visualized in her larynx/trachea.  Prominent cricopharyngeus noted which did not impair barium flow.  Minimal retention of thin liquids at CP noted x1.  After testing pt advised SLP to issues with coughing with liquids more than foods.  Reviewed findings of test with pt using flouro loops and written information.  Advised she take very small single sips and  try using straw with chin down posture if helps to decrease cough with liquids.  Pt did not report dysphagia symptoms during testing.  Thanks for this consult, all education completed.  SLP Visit Diagnosis Dysphagia, unspecified (R13.10) Attention and concentration deficit following -- Frontal lobe and executive function deficit following -- Impact on safety and function Mild aspiration risk   No flowsheet data found.  No flowsheet data found. CHL IP DIET RECOMMENDATION 11/20/2019 SLP Diet Recommendations Regular solids;Thin liquid Liquid Administration via Cup;Straw Medication Administration Whole meds with liquid Compensations Slow rate;Small sips/bites Postural Changes Remain semi-upright after after feeds/meals (Comment);Seated upright at 90 degrees   No flowsheet data found.  No flowsheet data found.  No flowsheet data found.     CHL IP ORAL PHASE 11/20/2019 Oral Phase Impaired Oral - Pudding Teaspoon -- Oral - Pudding Cup -- Oral - Honey Teaspoon -- Oral - Honey Cup -- Oral - Nectar Teaspoon -- Oral - Nectar Cup WFL Oral - Nectar Straw -- Oral - Thin Teaspoon -- Oral - Thin Cup WFL Oral - Thin Straw WFL Oral - Puree WFL;Other (Comment) Oral - Mech Soft WFL  Oral - Regular -- Oral - Multi-Consistency -- Oral - Pill WFL Oral Phase - Comment --  CHL IP PHARYNGEAL PHASE 11/20/2019 Pharyngeal Phase WFL;Impaired Pharyngeal- Pudding Teaspoon -- Pharyngeal -- Pharyngeal- Pudding Cup -- Pharyngeal -- Pharyngeal- Honey Teaspoon -- Pharyngeal -- Pharyngeal- Honey Cup -- Pharyngeal -- Pharyngeal- Nectar Teaspoon -- Pharyngeal -- Pharyngeal- Nectar Cup WFL Pharyngeal -- Pharyngeal- Nectar Straw -- Pharyngeal -- Pharyngeal- Thin Teaspoon NT Pharyngeal -- Pharyngeal- Thin Cup WFL;Penetration/Aspiration during swallow Pharyngeal Material enters airway, remains ABOVE vocal cords then ejected out Pharyngeal- Thin Straw WFL;Penetration/Aspiration during swallow Pharyngeal Material enters airway, remains ABOVE vocal cords then ejected out Pharyngeal- Puree WFL Pharyngeal -- Pharyngeal- Mechanical Soft WFL Pharyngeal -- Pharyngeal- Regular -- Pharyngeal -- Pharyngeal- Multi-consistency -- Pharyngeal -- Pharyngeal- Pill WFL Pharyngeal -- Pharyngeal Comment --  CHL IP CERVICAL ESOPHAGEAL PHASE 11/20/2019 Cervical Esophageal Phase Impaired Pudding Teaspoon -- Pudding Cup -- Honey Teaspoon -- Honey Cup --  Nectar Teaspoon -- Nectar Cup Prominent cricopharyngeal segment Nectar Straw -- Thin Teaspoon -- Thin Cup Prominent cricopharyngeal segment Thin Straw Prominent cricopharyngeal segment Puree Prominent cricopharyngeal segment Mechanical Soft Prominent cricopharyngeal segment Regular -- Multi-consistency -- Pill Prominent cricopharyngeal segment Cervical Esophageal Comment Prominent CP did not impair barium flow, Trace amount of liquid retained above it x1.   Kathleen Lime, MS [facility] SLP Acute Rehab Services Office [phone] Macario Golds 11/20/2019, 5:05 PM            CLINICAL DATA:  Coughing. Globus sensation. Gastroesophageal reflux disease. EXAM: MODIFIED BARIUM SWALLOW TECHNIQUE: Different consistencies of barium were administered orally to the patient by the Speech Pathologist. Imaging of the  pharynx was performed in the lateral projection. The radiologist was present in the fluoroscopy room for this study, providing personal supervision. FLUOROSCOPY TIME:  Fluoroscopy Time:  1 minutes 12 seconds Radiation Exposure Index (if provided by the fluoroscopic device): 7.4 mGy Number of Acquired Spot Images: 0 COMPARISON:  None. FINDINGS: No vestibular penetration was seen with ordinary single swallows, however flash vestibular penetration was noted with rapid bolus swallowing. No evidence of aspiration. Prominent cricopharyngeal impression is noted during swallowing, however there is no evidence of Zenker's diverticulum. No abnormality seen during swallowing of nectar, puree, puree with cracker, or tablet. IMPRESSION: Flash vestibular penetration noted only with rapid bolus swallowing, which is considered normal for age. No evidence of aspiration. Prominent cricopharyngeal impression noted during swallowing. No evidence of Zenker's diverticulum. Please refer to the Speech Pathologists report for complete details and recommendations. Electronically Signed   By: Marlaine Hind M.D.   On: 11/20/2019 14:16     Assessment & Plan:   Urticaria - Developed hives to forearms and lip swelling last night after dinner  - Received Depo-medrol injection today in office - Sending in prednisone taper (40mg  x 2 days; 30mg  x 2 days; 20mg  x 2 days; 10mg  x 2 days) - Start Loratadine 10mg  daily  - Referring to dermatology - If you develop respiratory distress please call 911 for ED evaluation  Cough - Cough has improved - She had a normal MBS, flash laryngeal penetration with thin liquids observed. Recommend small sips of liquids, use straw and perform chin tuck. - Ultrasound of her neck showed small hypoechoic soft tissue nodules in the mid neck bilaterally, most consistent with lymph nodes, not frankly enlarged but with mildly abnormal rounded shape as well as some suspected cortical thickening. - Ordering CT  soft tissue neck wo contrast to evaluate soft tissue nodules    FU on Monday if unable to see in with Dermatology   Martyn Ehrich, NP 11/22/2019

## 2019-11-25 ENCOUNTER — Other Ambulatory Visit: Payer: Self-pay | Admitting: Internal Medicine

## 2019-11-26 ENCOUNTER — Telehealth: Payer: Self-pay | Admitting: Primary Care

## 2019-11-26 ENCOUNTER — Encounter: Payer: Self-pay | Admitting: Neurology

## 2019-11-26 DIAGNOSIS — R0600 Dyspnea, unspecified: Secondary | ICD-10-CM

## 2019-11-26 DIAGNOSIS — R059 Cough, unspecified: Secondary | ICD-10-CM

## 2019-11-26 DIAGNOSIS — R0609 Other forms of dyspnea: Secondary | ICD-10-CM

## 2019-11-26 NOTE — Telephone Encounter (Signed)
ATC patient at [phone] [facility] about coming in to get lab work done for upcoming [place]. Tried to call patient on home phone and it just rang and rang with no option to leave voicemail.

## 2019-11-26 NOTE — Telephone Encounter (Signed)
Anabel Halon from [place] [facility] sent a message that pt is scheduled for CT on 8/12 and order has been changed to WITH contrast so she will need to have labs.  Depending on when pt is going to get labs done order may need to be put in as stat so results will be back in time.

## 2019-11-27 ENCOUNTER — Telehealth: Payer: Self-pay | Admitting: Primary Care

## 2019-11-27 DIAGNOSIS — M519 Unspecified thoracic, thoracolumbar and lumbosacral intervertebral disc disorder: Secondary | ICD-10-CM | POA: Diagnosis not present

## 2019-11-27 DIAGNOSIS — M542 Cervicalgia: Secondary | ICD-10-CM | POA: Diagnosis not present

## 2019-11-27 DIAGNOSIS — R0609 Other forms of dyspnea: Secondary | ICD-10-CM

## 2019-11-27 DIAGNOSIS — R059 Cough, unspecified: Secondary | ICD-10-CM

## 2019-11-27 DIAGNOSIS — R0600 Dyspnea, unspecified: Secondary | ICD-10-CM

## 2019-11-27 NOTE — Telephone Encounter (Signed)
Labs can be put in as STAT and pt can go to lab on [address] that morning and have CT that afternoon.

## 2019-11-27 NOTE — Telephone Encounter (Signed)
Patient calling regarding CT and labs. Please advise how triage can assist.

## 2019-11-27 NOTE — Telephone Encounter (Signed)
I have placed order as STAT.  Called and spoke with pt letting her know that she could go morning 8/12 for labwork and then have her CT as scheduled that afternoon and she verbalized understanding. Nothing further needed.

## 2019-11-28 ENCOUNTER — Other Ambulatory Visit: Payer: Self-pay | Admitting: Obstetrics and Gynecology

## 2019-11-28 DIAGNOSIS — Z1231 Encounter for screening mammogram for malignant neoplasm of breast: Secondary | ICD-10-CM

## 2019-11-29 ENCOUNTER — Other Ambulatory Visit (INDEPENDENT_AMBULATORY_CARE_PROVIDER_SITE_OTHER): Payer: Medicare HMO

## 2019-11-29 ENCOUNTER — Other Ambulatory Visit: Payer: Self-pay

## 2019-11-29 ENCOUNTER — Ambulatory Visit (INDEPENDENT_AMBULATORY_CARE_PROVIDER_SITE_OTHER)
Admission: RE | Admit: 2019-11-29 | Discharge: 2019-11-29 | Disposition: A | Discharge status: Home / Self Care | Payer: Medicare HMO | Source: Ambulatory Visit | Attending: Primary Care | Admitting: Primary Care

## 2019-11-29 DIAGNOSIS — M542 Cervicalgia: Secondary | ICD-10-CM

## 2019-11-29 DIAGNOSIS — R05 Cough: Secondary | ICD-10-CM | POA: Diagnosis not present

## 2019-11-29 DIAGNOSIS — R0600 Dyspnea, unspecified: Secondary | ICD-10-CM

## 2019-11-29 DIAGNOSIS — R059 Cough, unspecified: Secondary | ICD-10-CM

## 2019-11-29 DIAGNOSIS — M40209 Unspecified kyphosis, site unspecified: Secondary | ICD-10-CM | POA: Diagnosis not present

## 2019-11-29 DIAGNOSIS — R0609 Other forms of dyspnea: Secondary | ICD-10-CM

## 2019-11-29 LAB — BASIC METABOLIC PANEL
BUN: 26 mg/dL — ABNORMAL HIGH (ref 6–23)
CO2: 25 mEq/L (ref 19–32)
Calcium: 9.4 mg/dL (ref 8.4–10.5)
Chloride: 102 mEq/L (ref 96–112)
Creatinine, Ser: 0.98 mg/dL (ref 0.40–1.20)
GFR: 53.58 mL/min — ABNORMAL LOW (ref >60.00)
Glucose, Bld: 111 mg/dL — ABNORMAL HIGH (ref 70–99)
Potassium: 3.9 mEq/L (ref 3.5–5.1)
Sodium: 135 mEq/L (ref 135–145)

## 2019-11-29 MED ORDER — IOHEXOL 300 MG/ML  SOLN
75.0000 mL | Freq: Once | INTRAMUSCULAR | Status: AC | PRN
  Administered 2019-11-29: 75 mL via INTRAVENOUS

## 2019-11-30 NOTE — Progress Notes (Signed)
Please let patient know CT neck was benign. Negative for mass or adenopathy.Advanced cervical spine degeneration

## 2019-11-30 NOTE — Progress Notes (Signed)
Ok, please forward CT soft tissue neck as well

## 2019-11-30 NOTE — Progress Notes (Signed)
I would discuss with PCP or we can refer to neurosurgery

## 2019-12-03 ENCOUNTER — Telehealth: Payer: Self-pay | Admitting: Internal Medicine

## 2019-12-03 NOTE — Telephone Encounter (Signed)
The patient called wanting the results from her CT scan  Please advise

## 2019-12-05 NOTE — Telephone Encounter (Signed)
Informed pt of the message from PCP. Pt will contact pulmonary

## 2019-12-05 NOTE — Telephone Encounter (Signed)
Attempted to call patient but unable to leave a message. 

## 2019-12-05 NOTE — Telephone Encounter (Signed)
Looks like CT was ordered by pulmonary.

## 2019-12-11 ENCOUNTER — Telehealth: Payer: Self-pay | Admitting: Primary Care

## 2019-12-11 NOTE — Telephone Encounter (Signed)
Attempted to call pt but unable to reach. Left message for her to return call. 

## 2019-12-11 NOTE — Telephone Encounter (Signed)
Called pt again but unable to reach. Left message for her to return call.  We need to gather more information from pt in regards to the hives other than that they are getting worse and that they are spreading. We need to know if she has taken any new medication, has she tried any OTC creams or meds to see if it would help with the hives. Is she still on the prednisone or has she finished it and if she has finished it, when did she take last pill. Is she having any itching from the hives, any fever?  We will await return call from pt so we can gather more information from her.

## 2019-12-11 NOTE — Telephone Encounter (Signed)
Patient is returning phone call. Patient phone number is [phone].

## 2019-12-11 NOTE — Telephone Encounter (Signed)
Primary Pulmonologist: Wert Last office visit and with whom: 11/22/19 with Beth What do we see them for (pulmonary problems): DOE Last OV assessment/plan:  Assessment & Plan:   Urticaria - Developed hives to forearms and lip swelling last night after dinner  - Received Depo-medrol injection today in office - Sending in prednisone taper (40mg  x 2 days; 30mg  x 2 days; 20mg  x 2 days; 10mg  x 2 days) - Start Loratadine 10mg  daily  - Referring to dermatology - If you develop respiratory distress please call 911 for ED evaluation  Cough - Cough has improved - She had a normal MBS, flash laryngeal penetration with thin liquids observed. Recommend small sips of liquids, use straw and perform chin tuck. - Ultrasound of her neck showed small hypoechoic soft tissue nodules in the mid neck bilaterally, most consistent with lymph nodes, not frankly enlarged but with mildly abnormal rounded shape as well as some suspected cortical thickening. - Ordering CT soft tissue neck wo contrast to evaluate soft tissue nodules    FU on Monday if unable to see in with Dermatology   Martyn Ehrich, NP 11/22/2019  Was appointment offered to patient (explain)?  Pt wants recommendations   Reason for call: pt returned call after unable to reach her when I called her. Pt stated she has a new cell phone that will not let her answer it when someone calls and she recently moved so she does not have the landline hooked up yet.  Asked pt if she could provide me more information about her rash so I could let Beth know.  Pt stated that the rash has spread to her face under her eyes and also covering both cheeks, on lower arms and also on upper arms, on her chest above her breast but below her neck, on both inner thighs, and also on both lower legs above the ankles; left leg is worse.  Pt is no longer taking prednisone which was prescribed at last visit as she took last pill about 2 days ago. Pt is taking the  loratadine as directed which she states has not helped as rash became worse.  Pt has tried hand cream and moisturizer which she states does help some. Pt states that she has been having problems with itching and when she puts the cream on it, it has helped.  Asked pt if she has contacted her dermatologist to see if she could get an appt scheduled to be seen and she stated that she has not. Stated to pt to contact dermatologist to see if she could get an appt scheduled and she verbalized understanding. Stated to pt to call our office back to let us know if she was able to get an appt scheduled with dermatology and if so, when the appt is.  Will keep encounter open until we await return call from pt about appt info.  Allergies  Allergen Reactions  . Amitriptyline Hcl     REACTION: hallucinations  . Carisoprodol     REACTION: nightmares,rash,tachycardia  . Codeine     REACTION: "deathly ill" nauesa, dizzy,weak  . Fosamax [Alendronate Sodium] Other (See Comments)    Dental issues  . Meloxicam     REACTION: sensitive to sun; facial redness; may cause severe abd pain  . Metronidazole     REACTION: abd pain  . Nortriptyline Hcl     REACTION: hallucinations  . Oxaprozin     REACTION: abd pain  . Promethazine Hcl     REACTION:  spastic arms and legs flailing while awake and unusual behavior while sleep - walking all over house during night  . Propranolol Other (See Comments)    hallucinations  . Rofecoxib     REACTION: rash  . Sumatriptan     REACTION: abdominal pain, blurry vision, drowsiness  . Tramadol Other (See Comments)    Dizziness and constipation  . Venlafaxine     Hallucinations     Immunization History  Administered Date(s) Administered  . Fluad Quad(high Dose 65+) 12/25/2018  . Influenza Split 12/23/2013, 01/05/2017, 12/26/2017  . Influenza Whole 03/19/2006, 02/03/2007, 04/29/2008, 12/21/2008  . Influenza, High Dose Seasonal PF 12/18/2012  . Influenza-Unspecified  12/18/2013, 12/19/2014, 01/02/2016  . Moderna SARS-COVID-2 Vaccination 08/06/2019, 08/20/2019  . Pneumococcal Conjugate-13 04/27/2014, 04/17/2015  . Pneumococcal Polysaccharide-23 01/18/2003, 01/18/2008  . Td 01/18/2003  . Tdap 12/20/2012  . Zoster 02/17/2006

## 2019-12-11 NOTE — Telephone Encounter (Signed)
Patient is returning phone call. Patient phone number is [phone]. May leave detailed message on voicemail.

## 2019-12-14 NOTE — Telephone Encounter (Signed)
Patient has appointment with Dr. Wilhemina Bonito on December 17, 2019 at 11:00 am. Patient phone number is [phone].

## 2019-12-14 NOTE — Telephone Encounter (Signed)
Noted  

## 2019-12-17 DIAGNOSIS — L817 Pigmented purpuric dermatosis: Secondary | ICD-10-CM | POA: Diagnosis not present

## 2019-12-17 DIAGNOSIS — L309 Dermatitis, unspecified: Secondary | ICD-10-CM | POA: Diagnosis not present

## 2019-12-17 DIAGNOSIS — Z85828 Personal history of other malignant neoplasm of skin: Secondary | ICD-10-CM | POA: Diagnosis not present

## 2019-12-19 ENCOUNTER — Other Ambulatory Visit: Payer: Self-pay | Admitting: Internal Medicine

## 2019-12-25 DIAGNOSIS — M519 Unspecified thoracic, thoracolumbar and lumbosacral intervertebral disc disorder: Secondary | ICD-10-CM | POA: Diagnosis not present

## 2019-12-25 DIAGNOSIS — M542 Cervicalgia: Secondary | ICD-10-CM | POA: Diagnosis not present

## 2020-01-01 ENCOUNTER — Ambulatory Visit
Admission: RE | Admit: 2020-01-01 | Discharge: 2020-01-01 | Disposition: A | Discharge status: Home / Self Care | Payer: Medicare HMO | Source: Ambulatory Visit | Attending: Obstetrics and Gynecology | Admitting: Obstetrics and Gynecology

## 2020-01-01 ENCOUNTER — Other Ambulatory Visit: Payer: Self-pay

## 2020-01-01 DIAGNOSIS — Z1231 Encounter for screening mammogram for malignant neoplasm of breast: Secondary | ICD-10-CM

## 2020-01-07 DIAGNOSIS — D539 Nutritional anemia, unspecified: Secondary | ICD-10-CM | POA: Diagnosis not present

## 2020-01-07 DIAGNOSIS — Z1159 Encounter for screening for other viral diseases: Secondary | ICD-10-CM | POA: Diagnosis not present

## 2020-01-07 DIAGNOSIS — K921 Melena: Secondary | ICD-10-CM | POA: Diagnosis not present

## 2020-01-07 DIAGNOSIS — Z79899 Other long term (current) drug therapy: Secondary | ICD-10-CM | POA: Diagnosis not present

## 2020-01-08 ENCOUNTER — Telehealth: Payer: Self-pay | Admitting: *Deleted

## 2020-01-08 NOTE — Telephone Encounter (Signed)
Patient spoke with nurse triage on 01/07/2020 at 2:22. Patient reports she has burning and black leaky stool. She is using white petroleum. She can't walk without it hurting. Patient reports her bottom is "extremely sore", hurts to wipe. Patient  was advised to that she will need to be seen in UC and she verbalizes understanding, states she will have to find a ride.   Clinic RN does not see where patient went to [facility]

## 2020-01-09 DIAGNOSIS — Z981 Arthrodesis status: Secondary | ICD-10-CM | POA: Diagnosis not present

## 2020-01-09 DIAGNOSIS — M5136 Other intervertebral disc degeneration, lumbar region: Secondary | ICD-10-CM | POA: Diagnosis not present

## 2020-01-09 DIAGNOSIS — M545 Low back pain: Secondary | ICD-10-CM | POA: Diagnosis not present

## 2020-01-09 DIAGNOSIS — M9979 Connective tissue and disc stenosis of intervertebral foramina of abdomen and other regions: Secondary | ICD-10-CM | POA: Diagnosis not present

## 2020-01-09 DIAGNOSIS — M47816 Spondylosis without myelopathy or radiculopathy, lumbar region: Secondary | ICD-10-CM | POA: Diagnosis not present

## 2020-01-10 ENCOUNTER — Ambulatory Visit
Admission: RE | Admit: 2020-01-10 | Discharge: 2020-01-10 | Disposition: A | Discharge status: Home / Self Care | Payer: Medicare HMO | Source: Ambulatory Visit | Attending: Obstetrics and Gynecology | Admitting: Obstetrics and Gynecology

## 2020-01-10 ENCOUNTER — Other Ambulatory Visit: Payer: Self-pay

## 2020-01-10 ENCOUNTER — Other Ambulatory Visit: Payer: Self-pay | Admitting: Obstetrics and Gynecology

## 2020-01-10 DIAGNOSIS — Z1231 Encounter for screening mammogram for malignant neoplasm of breast: Secondary | ICD-10-CM | POA: Diagnosis not present

## 2020-01-15 ENCOUNTER — Ambulatory Visit (INDEPENDENT_AMBULATORY_CARE_PROVIDER_SITE_OTHER): Payer: Medicare HMO | Admitting: Counselor

## 2020-01-15 ENCOUNTER — Encounter: Payer: Self-pay | Admitting: Counselor

## 2020-01-15 ENCOUNTER — Other Ambulatory Visit: Payer: Self-pay | Admitting: Internal Medicine

## 2020-01-15 ENCOUNTER — Ambulatory Visit: Payer: Medicare HMO

## 2020-01-15 ENCOUNTER — Other Ambulatory Visit: Payer: Self-pay

## 2020-01-15 DIAGNOSIS — F09 Unspecified mental disorder due to known physiological condition: Secondary | ICD-10-CM

## 2020-01-15 DIAGNOSIS — G3184 Mild cognitive impairment, so stated: Secondary | ICD-10-CM | POA: Diagnosis not present

## 2020-01-15 DIAGNOSIS — R413 Other amnesia: Secondary | ICD-10-CM

## 2020-01-15 NOTE — Progress Notes (Signed)
   Psychometrist Note   Cognitive testing was administered to [name] by [name], B.S. (Technician) under the supervision of Alphonzo Severance, Psy.D., ABN. [name] was able to tolerate all test procedures. Dr. Nicole Kindred met with the patient as needed to manage any emotional reactions to the testing procedures. Rest breaks were offered.    The battery of tests administered was selected by Dr. Nicole Kindred with consideration to the patient's current level of functioning, the nature of her symptoms, emotional and behavioral responses during the interview, level of literacy, observed level of motivation/effort, and the nature of the referral question. This battery was communicated to the psychometrist. Communication between Dr. Nicole Kindred and the psychometrist was ongoing throughout the evaluation and Dr. Nicole Kindred was immediately accessible at all times. Dr. Nicole Kindred provided supervision to the technician on the date of this service, to the extent necessary to assure the quality of all services provided.    [name] will return in approximately one week for an interactive feedback session with Dr. Nicole Kindred, at which time test performance, clinical impressions, and treatment recommendations will be reviewed in detail. The patient understands she can contact our office should she require our assistance before this time.   A total of 90 minutes of billable time were spent with [name] by the technician, including test administration and scoring time. Billing for these services is reflected in Dr. Les Pou note.   This note reflects time spent with the psychometrician and does not include test scores, clinical history, or any interpretations made by Dr. Nicole Kindred. The full report will follow in a separate note.
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
## 2020-01-17 NOTE — Progress Notes (Signed)
[facility]  Patient Name: [name] MRN: [id number] Date of Birth: 07-Dec-1929 Age: 86 y.o. Education: 20+   Measurement properties of test scores: IQ, Index, and Standard Scores (SS): Mean = 100; Standard Deviation = 15 Scaled Scores (Ss): Mean = 10; Standard Deviation = 3 Z scores (Z): Mean = 0; Standard Deviation = 1 T scores (T); Mean = 50; Standard Deviation = 10  TEST SCORES:    Note: This summary of test scores accompanies the interpretive report and should not be interpreted by unqualified individuals or in isolation without reference to the report. Test scores are relative to age, gender, and educational history as available and appropriate.   Performance Validity        "A" Random Letter Test Raw  Descriptor      Errors 0 Within Expectation  The Dot Counting Test: 10 Within Expectation      Expected Functioning        Wide Range Achievement Test: Standard/Scaled Score Percentile      Word Reading 134 99      Attention/Processing Speed        Neuropsychological Assessment Battery (Attention Module, Form 1): T-score Percentile      Digits Forward 47 38      Digits Backwards 46 34      Repeatable Battery for the Assessment of Neuropsychological Status (Form A): Scaled Score Percentile      Coding 10 50      Language        Neuropsychological Assessment Battery (Language Module, Form 1): T-score Percentile      Naming   (28) 44 27      Verbal Fluency: T-score Percentile      Controlled Oral Word Association (F-A-S) 28 2      Semantic Fluency (Animals) 38 12      Memory:        Neuropsychological Assessment Battery (Memory Module, Form 1): T-score Percentile      List Learning           List A Immediate Recall   (4, 6, 7) 38 12         List B Immediate Recall   (2) 35 7         List A Short Delayed Recall   (2) 25 1         List A Long Delayed Recall   (7) 49 46         List A Percent Retention   (350  %) --- >99         List A Long Delayed Yes/No Recognition Hits   (10) --- 31         List A Long Delayed Yes/No Recognition False Alarms   (4) --- 62         List A Recognition Discriminability Index --- 54     Story Learning           Immediate Recall   (28, 33) 47 38         Delayed Recall   (33) 50 50         Percent Retention   (100 %) --- 75      Daily Living Memory            Immediate Recall   (22, 15) 40 16          Delayed Recall   (9, 6) 55 69  Percent Retention (100 %) --- 84          Recognition Hits    (10) --- 91      Repeatable Battery for the Assessment of Neuropsychological Status (Form A): Scaled Score Percentile         Figure Recall   (10) 9 37      Visuospatial/Constructional Functioning        Repeatable Battery for the Assessment of Neuropsychological Status (Form A): Standard/Scaled Score Percentile     Visuospatial/Constructional Index 112 79         Figure Copy   (20) 14 91         Judgment of Line Orientation   (16) --- 26-50      Executive Functioning        Trail Making Test: T-Score Percentile      Part A 59 82      Part B 40 16      [place] Diagnostic Aphasia Exam: Raw Score Scaled Score      Complex Ideational Material 10 7      Rating Scales         Raw Score Descriptor  Geriatric Depression Scale - Short Form 4 Negative   Tiyah Zelenak V. Nicole Kindred PsyD, [place] Clinical Neuropsychologist

## 2020-01-18 NOTE — Progress Notes (Signed)
[facility]  Patient Name: [name] MRN: [id number] Date of Birth: 23-Oct-1929 Age: 86 y.o. Education: 20+ years  Clinical Impressions  [name] is a 86 y.o., right-hand dominant, widowed woman with a history of suspected adverse reaction to propranolol resulting in visual hallucinations and cognitive changes noticed around that time about a year ago. She was seen previously by Dr. Delice Lesch and at that appointment, reported noticing cognitive changes over approximately the past 5 years. Within the present evaluation, she stated it was more like a year. She had very minimal examples of her day-to-day symptoms and it sounds like they mainly involve memory loss and some momentary confusion about where her refridgerator is. She lives in [place] and does not drive. She is independent with all other tasks.   Neuropsychological evaluation reveals fairly benign performance. Nevertheless, she did perform marginally below expectations with respect to verbal fluency measures. She also had marginal encoding on single word list learning. The extent of her difficulties is very mild. She had no memory storage problems and performed entirely within normal limits in all other domains. She is doing very well cognitively at her age. There are no findings that are specific or particularly concerning for a clinically evident Alzheimer's clinical phenotype.   Diagnostic Impressions: Mild cognitive impairment  Recommendations to be discussed with patient  Your performance and presentation on assessment were consistent with some very mild diminution showing on measures of language function and you had a marginal score with respect to one memory test. These findings barely reach the threshold of abnormality and there are no findings that are particularly concerning to me for clinically evidence Alzheimer's dementia. If you are concerned about your  memory and thinking abilities, I recommend that you engage in healthy lifestyle changes instead of using medications, which are only approved in the setting of dementia and are sometimes used off label for individuals with mild cognitive impairment.   You should be aware that two of your medications, specifically gabapentin and cyclobenzaprine, can be associated with cognitive difficulties. Given that you had normal testing, that is more provided for your information than as a suggestion to discuss these with your prescribing providers, because there does not appear to be any significant cognitive impairment.   There is now good quality evidence from at least one large scale study that a modified mediterranean diet may help slow cognitive decline. This is known as the "MIND" diet. The Mind diet is not so much a specific diet as it is a set of recommendations for things that you should and should not eat.   Foods that are ENCOURAGED on the MIND Diet:  Green, leafy vegetables: Aim for six or more servings per week. This includes kale, spinach, cooked greens and salads.  All other vegetables: Try to eat another vegetable in addition to the green leafy vegetables at least once a day. It is best to choose non-starchy vegetables because they have a lot of nutrients with a low number of calories.  Berries: Eat berries at least twice a week. There is a plethora of research on strawberries, and other berries such as blueberries, raspberries and blackberries have also been found to have antioxidant and brain health benefits.  Nuts: Try to get five servings of nuts or more each week. The creators of the MIND diet don't specify what kind of nuts to consume, but it is probably best to vary the type of nuts you eat to obtain  a variety of nutrients. Peanuts are a legume and do not fall into this category.  Olive oil: Use olive oil as your main cooking oil. There may be other heart-healthy alternatives such as algae oil,  though there is not yet sufficient research upon which to base a formal recommendation.  Whole grains: Aim for at least three servings daily. Choose minimally processed grains like oatmeal, quinoa, brown rice, whole-wheat pasta and 100% whole-wheat bread.  Fish: Eat fish at least once a week. It is best to choose fatty fish like salmon, sardines, trout, tuna and mackerel for their high amounts of omega-3 fatty acids.  Beans: Include beans in at least four meals every week. This includes all beans, lentils and soybeans.  Poultry: Try to eat chicken or [place] at least twice a week. Note that fried chicken is not encouraged on the MIND diet.  Wine: Aim for no more than one glass of alcohol daily. Both red and white wine may benefit the brain. However, much research has focused on the red wine compound resveratrol, which may help protect against Alzheimer's disease.  Foods that are DISCOURAGED on the MIND Diet: Butter and margarine: Try to eat less than 1 tablespoon (about 14 grams) daily. Instead, try using olive oil as your primary cooking fat, and dipping your bread in olive oil with herbs.  Cheese: The MIND diet recommends limiting your cheese consumption to less than once per week.  Red meat: Aim for no more than three servings each week. This includes all beef, pork, lamb and products made from these meats.  [place] Pro food: The MIND diet highly discourages fried food, especially the kind from fast-food restaurants. Limit your consumption to less than once per week.  Pastries and sweets: This includes most of the processed junk food and desserts you can think of. Ice cream, cookies, brownies, snack cakes, donuts, candy and more. Try to limit these to no more than four times a week.  Exercise is one of the best medicines for promoting health and maintaining cognitive fitness at all stages in life. Exercise probably has the largest documented effect on brain health and performance of any lifestyle  intervention. Studies have shown that even previously sedentary individuals who start exercising as late as age 35 show a significant survival benefit as compared to their non-exercising peers. In the [place], the current guidelines are for 30 minutes of moderate exercise per day, but increasing your activity level less than that may also be helpful. You do not have to get your 30 minutes of exercise in one shot and exercising for short periods of time spread throughout the day can be helpful. Go for several walks, learn to dance, or do something else you enjoy that gets your body moving. Of course, if you have an underlying medical condition or there is any question about whether it is safe for you to exercise, you should consult a medical treatment provider prior to beginning exercise.   Test Findings  Test scores are summarized in additional documentation associated with this encounter. Test scores are relative to age, gender, and educational history as available and appropriate. There were no concerns about performance validity as all findings fell within normal expectations.   General Intellectual Functioning/Achievement:  Performance on single word reading was very good, falling at a very superior level, which suggests a stringent standard of comparison is appropriate for this patient's cognitive test performance.   Attention and Processing Efficiency: Performance on indicators of attention and processing efficiency  was good with average range scores on measures fo digit repetition forward and backward. Performance on timed number-symbol coding was average and simple numeric sequencing was high average, suggesting good processing speed abilities.   Language: Performance on language measures was marginally below expectations for [name]. She did well on a measure of visual object confrontation naming although low scores were demonstrated on associative verbal fluency measures. Phonemic  fluency was extremely low and generation of animals in one minute was weak (low average almost unusually low).   Visuospatial Function: Performance was normal on visuospatial and constructional measures with a high average overall score on the relevant index of the RBANS. Figure cope was errorless. Judgment of angular line orientations was average.   Learning and Memory: Performance on measures of memory and learning was within normal expectations overall; however, she did have a few low scores of note. The profile does not suggest a memory storage problem but does suggesting some possible mildly reduced encoding and also susceptibility to retroactive interference.   In the verbal realm, immediate recall for a 12-item word list was extremely low. Following a short delay interspersed with presentation of a distractor alternate word list, performance was extremely low, which is retroactive interference and may be on the basis of executive/attentional factors. She did retain the words on long delayed recall with average performance. Memory for a short story was more consistent with good average range immediate and delayed recall performance. Memory for brief daily-living type information showed low average immediate recall and average delayed recall. Recognition for that information was average to high average.   Delayed recall for visual information was average.  Executive Functions: Performance on executive measures was within normal limits although her low scores on verbal fluency measures and difficulties with retroactive interference may suggest an executive control problem. Alternating sequencing of numbers and letters of the alphabet was low average. Performance was low average when reasoning with complex verbal information, which could be less than expected for her given high premorbid ability. Generation of words in response to the letters F-A-S was extremely low.   Rating Scale(s): [name]  screened negative for the presence of depression.   Viviano Simas Nicole Kindred PsyD, [place] Clinical Neuropsychologist

## 2020-01-22 ENCOUNTER — Encounter: Payer: Self-pay | Admitting: Counselor

## 2020-01-22 ENCOUNTER — Other Ambulatory Visit: Payer: Self-pay

## 2020-01-22 ENCOUNTER — Ambulatory Visit (INDEPENDENT_AMBULATORY_CARE_PROVIDER_SITE_OTHER): Payer: Medicare HMO | Admitting: Counselor

## 2020-01-22 DIAGNOSIS — G3184 Mild cognitive impairment, so stated: Secondary | ICD-10-CM | POA: Diagnosis not present

## 2020-01-22 NOTE — Patient Instructions (Signed)
Your performance and presentation on assessment were consistent with some very mild diminution showing on measures of language function and you had a marginal score with respect to one memory test. These findings barely reach the threshold of abnormality and there are no findings that are particularly concerning to me for clinically evidence Alzheimer's dementia. If you are concerned about your memory and thinking abilities, I recommend that you engage in healthy lifestyle changes instead of using medications, which are only approved in the setting of dementia and are sometimes used off label for individuals with mild cognitive impairment.   You should be aware that two of your medications, specifically gabapentin and cyclobenzaprine, can be associated with cognitive difficulties. Given that you had normal testing, that is more provided for your information than as a suggestion to discuss these with your prescribing providers, because there does not appear to be any significant cognitive impairment.   There is now good quality evidence from at least one large scale study that a modified mediterranean diet may help slow cognitive decline. This is known as the "MIND" diet. The Mind diet is not so much a specific diet as it is a set of recommendations for things that you should and should not eat.   Foods that are ENCOURAGED on the MIND Diet:  Green, leafy vegetables: Aim for six or more servings per week. This includes kale, spinach, cooked greens and salads.  All other vegetables: Try to eat another vegetable in addition to the green leafy vegetables at least once a day. It is best to choose non-starchy vegetables because they have a lot of nutrients with a low number of calories.  Berries: Eat berries at least twice a week. There is a plethora of research on strawberries, and other berries such as blueberries, raspberries and blackberries have also been found to have antioxidant and brain health benefits.   Nuts: Try to get five servings of nuts or more each week. The creators of the [place] don't specify what kind of nuts to consume, but it is probably best to vary the type of nuts you eat to obtain a variety of nutrients. Peanuts are a legume and do not fall into this category.  Olive oil: Use olive oil as your main cooking oil. There may be other heart-healthy alternatives such as algae oil, though there is not yet sufficient research upon which to base a formal recommendation.  Whole grains: Aim for at least three servings daily. Choose minimally processed grains like oatmeal, quinoa, brown rice, whole-wheat pasta and 100% whole-wheat bread.  Fish: Eat fish at least once a week. It is best to choose fatty fish like salmon, sardines, trout, tuna and mackerel for their high amounts of omega-3 fatty acids.  Beans: Include beans in at least four meals every week. This includes all beans, lentils and soybeans.  Poultry: Try to eat chicken or [place] at least twice a week. Note that fried chicken is not encouraged on the MIND diet.  Wine: Aim for no more than one glass of alcohol daily. Both red and white wine may benefit the brain. However, much research has focused on the red wine compound resveratrol, which may help protect against Alzheimer's disease.  Foods that are DISCOURAGED on the MIND Diet: Butter and margarine: Try to eat less than 1 tablespoon (about 14 grams) daily. Instead, try using olive oil as your primary cooking fat, and dipping your bread in olive oil with herbs.  Cheese: The MIND diet recommends limiting  your cheese consumption to less than once per week.  Red meat: Aim for no more than three servings each week. This includes all beef, pork, lamb and products made from these meats.  Maceo Pro food: The MIND diet highly discourages fried food, especially the kind from fast-food restaurants. Limit your consumption to less than once per week.  Pastries and sweets: This includes most of the  processed junk food and desserts you can think of. Ice cream, cookies, brownies, snack cakes, donuts, candy and more. Try to limit these to no more than four times a week.  Exercise is one of the best medicines for promoting health and maintaining cognitive fitness at all stages in life. Exercise probably has the largest documented effect on brain health and performance of any lifestyle intervention. Studies have shown that even previously sedentary individuals who start exercising as late as age 18 show a significant survival benefit as compared to their non-exercising peers. In the [place], the current guidelines are for 30 minutes of moderate exercise per day, but increasing your activity level less than that may also be helpful. You do not have to get your 30 minutes of exercise in one shot and exercising for short periods of time spread throughout the day can be helpful. Go for several walks, learn to dance, or do something else you enjoy that gets your body moving. Of course, if you have an underlying medical condition or there is any question about whether it is safe for you to exercise, you should consult a medical treatment provider prior to beginning exercise.

## 2020-01-22 NOTE — Progress Notes (Signed)
NEUROPSYCHOLOGY TELEMEDICINE NOTE [facility]  Telemedicine statement:  I discussed the limitations of neuropsychological care via telemedicine and the availability of in person appointments. The patient expressed understanding and agreed to proceed. The patient was verified with two identifiers.  The visit modality was: telephonic The patient location was: home The provider location was: office  The following individuals participated: [name]  Feedback Note: I met with [name] to review the findings resulting from her neuropsychological evaluation. Since the last appointment, she has been about the same. Unfortunately, she continues with various back and neck issues and has a fair amount of pain. I provided support and reinforced the importance of moving. Time was spent reviewing the impressions and recommendations that are detailed in the evaluation report. We discussed impression of very mild cognitive impairment, as reflected in the patient instructions. At her age she more than likely does have some degree of Alzheimer's pathology, but from a functional perspective, she is doing very well and I am not very concerned. I took time to explain the findings and answer all the patient's questions. I encouraged [name] to contact me should she have any further questions or if further follow up is desired.   Current Medications and Medical History   Current Outpatient Medications  Medication Sig Dispense Refill  . acetaminophen (TYLENOL) 325 MG tablet Take 650 mg by mouth every 6 (six) hours as needed.    Marland Kitchen amLODipine (NORVASC) 5 MG tablet Take 1 tablet (5 mg total) by mouth daily. 90 tablet 1  . Ascorbic Acid (VITAMIN C) 500 MG CHEW Chew 500 mg by mouth daily. Chew two tablets once a day    . atorvastatin (LIPITOR) 40 MG tablet TAKE 1 TABLET BY MOUTH EVERYDAY AT BEDTIME 90 tablet 1  . Bismuth Subsalicylate (PEPTO-BISMOL PO) Take 1 tablet by mouth daily  as needed.     . Ca [id number] (CALCIUM-FOLIC ACID PLUS D PO) Take by mouth daily.    . celecoxib (CELEBREX) 200 MG capsule TAKE 1 CAPSULE (200 MG TOTAL) BY MOUTH DAILY AS NEEDED FOR MODERATE PAIN. 90 capsule 1  . CRANBERRY PO Take 1 capsule by mouth daily.    . cyclobenzaprine (FLEXERIL) 10 MG tablet Take 1 tablet (10 mg total) by mouth 3 (three) times daily as needed for muscle spasms. 30 tablet 2  . dextromethorphan-guaiFENesin (MUCINEX DM) 30-600 MG 12hr tablet Take 1 tablet by mouth 2 (two) times daily as needed for cough.    . escitalopram (LEXAPRO) 10 MG tablet TAKE 1 TABLET BY MOUTH EVERY DAY 90 tablet 0  . famotidine (PEPCID) 20 MG tablet TAKE 1 TABLET BY MOUTH EVERY DAY 30 tablet 4  . famotidine-calcium carbonate-magnesium hydroxide (PEPCID COMPLETE) 10-800-165 MG chewable tablet Chew 1 tablet by mouth daily as needed.  (Patient not taking: Reported on 11/22/2019)    . furosemide (LASIX) 20 MG tablet furosemide 20 mg tablet  TAKE 1 TABLET BY MOUTH EVERY DAY    . gabapentin (NEURONTIN) 100 MG capsule Take 2-3 capsules (200-300 mg total) by mouth 3 (three) times daily. 810 capsule 1  . Liniments (BLUE-EMU SUPER STRENGTH) CREA Apply 1 application topically at bedtime as needed (dry skin).     Marland Kitchen loperamide (IMODIUM A-D) 2 MG tablet Take 2 mg by mouth 4 (four) times daily as needed for diarrhea or loose stools.  (Patient not taking: Reported on 11/22/2019)    . loratadine (CLARITIN) 10 MG tablet Take 1 tablet (10 mg total) by mouth  daily. 30 tablet 3  . NON FORMULARY Take 2 capsules by mouth 2 (two) times daily. Hydro Eye    . olopatadine (PATANOL) 0.1 % ophthalmic solution Place 1 drop into both eyes 2 (two) times daily. As directed    . pantoprazole (PROTONIX) 40 MG tablet TAKE 1 TABLET (40 MG TOTAL) BY MOUTH DAILY. TAKE 30-60 MIN BEFORE FIRST MEAL OF THE DAY 90 tablet 3  . Phenylephrine-Acetaminophen (SINUS PRESSURE + PAIN) 5-325 MG TABS Take by mouth as directed.    . Potassium  Chloride ER 20 MEQ TBCR TAKE 1 TABLET BY MOUTH EVERY DAY 90 tablet 0  . predniSONE (DELTASONE) 10 MG tablet Take 4 tabs po daily x 2 days; then 3 tabs for 2 days; then 2 tabs for 2 days; then 1 tab for 2 days 20 tablet 0  . Probiotic Product (PROBIOTIC DAILY PO) Take 1 capsule by mouth daily.     . Pumpkin Seed-Soy Germ (AZO BLADDER CONTROL/GO-LESS) CAPS Take by mouth. (Patient not taking: Reported on 11/22/2019)    . simethicone (GAS-X) 80 MG chewable tablet Chew 80 mg by mouth every 6 (six) hours as needed for flatulence.     No current facility-administered medications for this visit.    Patient Active Problem List   Diagnosis Date Noted  . Urticaria 11/22/2019  . Neck pain 11/08/2019  . (HFpEF) heart failure with preserved ejection fraction ([place]) 11/23/2018  . Abnormal gait 11/03/2018  . Edema of both lower extremities 04/18/2018  . Chronic migraine 11/15/2017  . Memory loss 11/15/2017  . Osteoporosis 07/09/2017  . CKD (chronic kidney disease) stage 2, GFR 60-89 ml/min 01/06/2016  . Abdominal pain 09/10/2015  . Pulmonary nodules/lesions, multiple 09/10/2015  . Small bowel obstruction, partial ([place]) 09/10/2015  . Arthritis 09/10/2015  . Partial small bowel obstruction ([place]) 09/10/2015  . PCP NOTES >>>>>> 04/17/2015  . Hemorrhoids 09/17/2011  . Annual physical exam 03/19/2011  . DJD -back pain-on Celebrex   . Cough 09/25/2010  . DOE (dyspnea on exertion) 08/28/2010  . Constipation 01/21/2010  . DIVERTICULOSIS OF COLON 09/02/2009  . PERSONAL HISTORY OF COLONIC POLYPS 09/02/2009  . FULL INCONTINENCE OF FECES 09/02/2009  . Essential hypertension 08/10/2007  . Headache 08/10/2007  . Pulmonary nodules 04/25/2007  . Anxiety state 02/03/2007  . Depression 02/03/2007  . KNEE PAIN, RIGHT, CHRONIC 02/03/2007  . Hyperlipidemia 04/26/2006  . DIZZINESS 04/26/2006  . SVT (supraventricular tachycardia) ([place]) 04/26/2006    Mental Status and Behavioral Observations  [name] was available at the prespecified time for this telephonic appointment and was alert and generally oriented (orientation not formally assessed). Speech was normal in rate, rhythm, volume, and prosody. Self-reported mood was "allright" and affect as assessed by vocal quality was mainly neutral to euthymic. Thought process was logical and goal oriented and thought content was appropriate. There were no safety concerns identified at today's encounter, such as thoughts of harming self or others.   Plan  Feedback provided regarding the patient's neuropsychological evaluation. She is doing very well cognitively at her age, with very mild, barely detectable cognitive problems. She has followup with Dr. Delice Lesch in 6 months, approximately, and re-evaluation can be considered at that time if desired. [name] was encouraged to contact me if any questions arise or if further follow up is desired.   Viviano Simas Lauren Kindred, PsyD, ABN Clinical Neuropsychologist  Service(s) Provided at This Encounter: 29 minutes [phone]; Psychotherapy with patient/family)

## 2020-01-25 ENCOUNTER — Other Ambulatory Visit: Payer: Self-pay | Admitting: Cardiology

## 2020-01-25 DIAGNOSIS — E785 Hyperlipidemia, unspecified: Secondary | ICD-10-CM

## 2020-01-25 DIAGNOSIS — I471 Supraventricular tachycardia: Secondary | ICD-10-CM

## 2020-01-25 DIAGNOSIS — I10 Essential (primary) hypertension: Secondary | ICD-10-CM

## 2020-02-05 DIAGNOSIS — M47816 Spondylosis without myelopathy or radiculopathy, lumbar region: Secondary | ICD-10-CM | POA: Diagnosis not present

## 2020-02-05 DIAGNOSIS — M5136 Other intervertebral disc degeneration, lumbar region: Secondary | ICD-10-CM | POA: Diagnosis not present

## 2020-02-05 DIAGNOSIS — M48061 Spinal stenosis, lumbar region without neurogenic claudication: Secondary | ICD-10-CM | POA: Diagnosis not present

## 2020-02-05 DIAGNOSIS — M9979 Connective tissue and disc stenosis of intervertebral foramina of abdomen and other regions: Secondary | ICD-10-CM | POA: Diagnosis not present

## 2020-02-05 DIAGNOSIS — M5442 Lumbago with sciatica, left side: Secondary | ICD-10-CM | POA: Diagnosis not present

## 2020-02-05 DIAGNOSIS — M545 Low back pain, unspecified: Secondary | ICD-10-CM | POA: Diagnosis not present

## 2020-02-08 ENCOUNTER — Telehealth: Payer: Self-pay | Admitting: Internal Medicine

## 2020-02-08 MED ORDER — FAMOTIDINE 20 MG PO TABS
20.0000 mg | ORAL_TABLET | Freq: Every day | ORAL | 4 refills | Status: DC
Start: 2020-02-08 — End: 2020-05-08

## 2020-02-08 NOTE — Telephone Encounter (Signed)
Instructions  For drainage / throat tickle try take CHLORPHENIRAMINE  4 mg  (Chlortab 4mg   at [employer] should be easiest to find in the green box)  take one every 4 hours as needed - available over the counter- may cause drowsiness so start with just a bedtime dose or two and see how you tolerate it before trying in daytime  - take one hour before bed  Pantoprazole (protonix) 40 mg   Take  30-60 min before first meal of the day and Pepcid (famotidine)  20 mg one supper  until return to office - this is the best way to tell whether stomach acid is contributing to your problem.       Called and spoke with pt letting her know to still continue on famotidine and pt verbalized understanding. Rx has been sent to preferred pharmacy for pt. Nothing further needed.

## 2020-02-13 ENCOUNTER — Other Ambulatory Visit: Payer: Self-pay | Admitting: Primary Care

## 2020-02-21 ENCOUNTER — Other Ambulatory Visit: Payer: Self-pay | Admitting: Internal Medicine

## 2020-02-26 ENCOUNTER — Other Ambulatory Visit: Payer: Self-pay

## 2020-02-26 ENCOUNTER — Other Ambulatory Visit: Payer: Self-pay | Admitting: Internal Medicine

## 2020-02-26 ENCOUNTER — Encounter: Payer: Self-pay | Admitting: Internal Medicine

## 2020-02-26 ENCOUNTER — Ambulatory Visit (INDEPENDENT_AMBULATORY_CARE_PROVIDER_SITE_OTHER): Payer: Medicare HMO | Admitting: Internal Medicine

## 2020-02-26 DIAGNOSIS — R0609 Other forms of dyspnea: Secondary | ICD-10-CM

## 2020-02-26 DIAGNOSIS — R0600 Dyspnea, unspecified: Secondary | ICD-10-CM

## 2020-02-26 DIAGNOSIS — R059 Cough, unspecified: Secondary | ICD-10-CM

## 2020-02-26 NOTE — Progress Notes (Signed)
[name], female    DOB: 02-03-1930,  .   MRN: [id number]   Brief patient profile:  44 yowf never smoker never breathing problems until fell tgiving 2019  and hit head in bathroom hitting on marble flooring and doe since so referred to pulmonary clinic 06/06/2018 by Dr Larose Kells p neg cardiac w/u by [facility]   Note seen pulmonary clinic  09/25/10 with "cough since 2010" with dx UACS rec gerd rx and zyrtec and improved but never completely resolved.     History of Present Illness  06/06/2018  Pulmonary/ 1st office eval/Murrell Elizondo  Chief Complaint  Patient presents with  . Pulmonary Consult    Referred by Dr. Larose Kells.  Pt c/o DOE with any exertion since she fell Thanksgiving 2019.   Dyspnea:  abrupt onset in nov 2019 p fell - barely now able to do HT p using HC parking = MMRC3 = can't walk 100 yards even at a slow pace at a flat grade s stopping due to sob   Cough: has had some pnds x decades no recent eval by allergy or ent  Sleep: in recliner ever before she fell same angle almost flat no problem with cough /wheeze or sob  SABA use: none Pos sense of globus  Prev w/u 2012 better on gerd rx/ zyrtec rec Try zyrtec 10 mg at bedtime instead of tylenol sinus  Pantoprazole (protonix) 40 mg   Take  30-60 min before first meal of the day and Pepcid (famotidine)  20 mg one after supper  until return to office - this is the best way to tell whether stomach acid is contributing to your problem.   GERD  Diet  Please schedule a follow up office visit in 6 weeks, call sooner if needed with all medications /inhalers/ solutions in hand so we can verify exactly what you are taking. This includes all medications from all doctors and over the counters with pfts on return     06/26/2018  f/u ov/Brenden Rudman re: uacs/ globus sensation  Chief Complaint  Patient presents with  . Follow-up    Breathing is unchanged. No new co's.   Dyspnea:  Walks with cane room to room then gives out more due  to orthopedic than pulmonary  limitations  Cough: sense of pnds/ globus no better on zyrtec and never took ppi Sleeping: In recliner chronically  rec Pantoprazole (protonix) 40 mg   Take  30-60 min before first meal of the day and Pepcid (famotidine)  20 mg one @  bedtime until return to office whether you think you need it or not   GERD  Diet  Keep walking as much possible    10/17/2018  f/u ov/Jakyle Petrucelli re: uacs better p gerd rx/ off it for several weeks s flare     Chief Complaint  Patient presents with  . Follow-up    Breathing is some better.   Dyspnea:  Walks with cane sob x 50 ft  Cough: gone  Sleeping: almost flat in recliner s resp symptoms  rec GERD diet  Continue Pepcid 20 mg one hour before bedtme   09/27/2019  f/u ov/Laiklyn Pilkenton re: uacs  On gabapentin 300 tid  Chief Complaint  Patient presents with  . Follow-up    more SOB  Dyspnea:  From one room to the other more sob  Cough: worse p eat / worse at hs  Sleeping: 45 degrees helps the cough / no true orthopnea SABA use: none 02: none  rec  For drainage / throat tickle try take CHLORPHENIRAMINE  4 mg Pantoprazole (protonix) 40 mg   Take  30-60 min before first meal of the day and Pepcid (famotidine)  20 mg one supper   GERD  diet Please schedule a follow up office visit in 6 weeks, call sooner if needed with all medications /inhalers/ solutions in hand so we can verify exactly what you are taking. This includes all medications from all doctors and over the counters - bring your walker        02/26/2020  f/u ov/Enslee Bibbins re:  uacs / unexplained  Sob on gabapentin 300 mg 3 x daily  Chief Complaint  Patient presents with  . Follow-up    Breathing is unchanged since the last visit.   Dyspnea:  Walks to elevator from room is getting easier with cane or walker  Cough: better, no trouble with swallowing Sleeping: 45 degrees recliner x years  SABA use: none  02: none  Takes tylenol sinus for dripping nose works to her satisfaction    No obvious day to day or  daytime variability or assoc excess/ purulent sputum or mucus plugs or hemoptysis or cp or chest tightness, subjective wheeze or overt sinus or hb symptoms.   sleeping without nocturnal  or early am exacerbation  of respiratory  c/o's or need for noct saba. Also denies any obvious fluctuation of symptoms with weather or environmental changes or other aggravating or alleviating factors except as outlined above   No unusual exposure hx or h/o childhood pna/ asthma or knowledge of premature birth.  Current Allergies, Complete Past Medical History, Past Surgical History, Family History, and Social History were reviewed in [employer] record.  ROS  The following are not active complaints unless bolded Hoarseness, sore throat, dysphagia, dental problems, itching, sneezing,  nasal congestion or discharge of excess mucus or purulent secretions, ear ache,   fever, chills, sweats, unintended wt loss or wt gain, classically pleuritic or exertional cp,  orthopnea pnd or arm/hand swelling  or leg swelling, presyncope, palpitations, abdominal pain, anorexia, nausea, vomiting, diarrhea  or change in bowel habits or change in bladder habits, change in stools or change in urine, dysuria, hematuria,  rash, arthralgias, visual complaints, headache, numbness, weakness or ataxia or problems with walking or coordination,  change in mood or  memory.        Current Meds  Medication Sig  . acetaminophen (TYLENOL) 325 MG tablet Take 650 mg by mouth every 6 (six) hours as needed.  Marland Kitchen amLODipine (NORVASC) 5 MG tablet TAKE 1 TABLET BY MOUTH EVERY DAY  . Ascorbic Acid (VITAMIN C) 500 MG CHEW Chew 500 mg by mouth daily. Chew two tablets once a day  . atorvastatin (LIPITOR) 40 MG tablet TAKE 1 TABLET BY MOUTH EVERYDAY AT BEDTIME  . Bismuth Subsalicylate (PEPTO-BISMOL PO) Take 1 tablet by mouth daily as needed.   . Ca [id number] (CALCIUM-FOLIC ACID PLUS D PO) Take by mouth daily.  . celecoxib  (CELEBREX) 200 MG capsule TAKE 1 CAPSULE (200 MG TOTAL) BY MOUTH DAILY AS NEEDED FOR MODERATE PAIN.  Marland Kitchen CRANBERRY PO Take 1 capsule by mouth daily.  . cyclobenzaprine (FLEXERIL) 10 MG tablet Take 1 tablet (10 mg total) by mouth 3 (three) times daily as needed for muscle spasms.  Marland Kitchen dextromethorphan-guaiFENesin (MUCINEX DM) 30-600 MG 12hr tablet Take 1 tablet by mouth 2 (two) times daily as needed for cough.  . escitalopram (LEXAPRO) 10 MG tablet TAKE 1 TABLET BY MOUTH  EVERY DAY  . famotidine (PEPCID) 20 MG tablet Take 1 tablet (20 mg total) by mouth daily.  Marland Kitchen gabapentin (NEURONTIN) 100 MG capsule Take 2-3 capsules (200-300 mg total) by mouth 3 (three) times daily.  . Liniments (BLUE-EMU SUPER STRENGTH) CREA Apply 1 application topically at bedtime as needed (dry skin).   Marland Kitchen loperamide (IMODIUM A-D) 2 MG tablet Take 2 mg by mouth 4 (four) times daily as needed for diarrhea or loose stools.   Marland Kitchen loratadine (CLARITIN) 10 MG tablet TAKE 1 TABLET BY MOUTH EVERY DAY  . NON FORMULARY Take 2 capsules by mouth 2 (two) times daily. Hydro Eye  . olopatadine (PATANOL) 0.1 % ophthalmic solution Place 1 drop into both eyes 2 (two) times daily. As directed  . pantoprazole (PROTONIX) 40 MG tablet TAKE 1 TABLET (40 MG TOTAL) BY MOUTH DAILY. TAKE 30-60 MIN BEFORE FIRST MEAL OF THE DAY  . Phenylephrine-Acetaminophen (SINUS PRESSURE + PAIN) 5-325 MG TABS Take by mouth as directed.  . Potassium Chloride ER 20 MEQ TBCR TAKE 1 TABLET BY MOUTH EVERY DAY  . Probiotic Product (PROBIOTIC DAILY PO) Take 1 capsule by mouth daily.   . simethicone (GAS-X) 80 MG chewable tablet Chew 80 mg by mouth every 6 (six) hours as needed for flatulence.                 Objective:      02/26/2020       162  09/27/2019       159 10/17/2018       164   06/26/18 163 lb (73.9 kg)  06/07/18 164 lb (74.4 kg)  06/06/18 164 lb (74.4 kg)      Vital signs reviewed  02/26/2020  - Note at rest 02 sats  95% on RA   HEENT : pt wearing mask  not removed for exam due to covid -19 concerns.    NECK :  without JVD/Nodes/TM/ nl carotid upstrokes bilaterally   LUNGS: no acc muscle use,  Nl contour chest which is clear to A and P bilaterally without cough on insp or exp maneuvers   CV:  RRR  no s3 or murmur or increase in P2, and no edema   ABD:  soft and nontender with nl inspiratory excursion in the supine position. No bruits or organomegaly appreciated, bowel sounds nl  MS:  Slow gait with cane/ ext warm without deformities, calf tenderness, cyanosis or clubbing No obvious joint restrictions   SKIN: warm and dry without lesions    NEURO:  alert, approp, nl sensorium with  no motor or cerebellar deficits apparent.                  Assessment

## 2020-02-26 NOTE — Assessment & Plan Note (Signed)
Onset 02/2018 p fell in bathroom - Echo 05/02/2018 Left ventricle: The cavity size was normal. Wall thickness was   normal. Systolic function was normal. The estimated ejection   fraction was in the range of 55% to 60%. Wall motion was normal;   there were no regional wall motion abnormalities. Doppler   parameters are consistent with abnormal left ventricular   relaxation (grade 1 diastolic dysfunction). - Mitral valve: Mildly calcified annulus. There was mild   regurgitation./ LA size nl  - 06/06/2018   Walked RA x one lap =  approx 250 ft - stopped due to  Sob at faster than avg pace, no desats PFT's  10/17/2018  FEV1 1.34 (79 % ) ratio 0.77  p 8 % improvement from saba p nothing prior to study with DLCO  72 % corrects to 104 % for alv volume   - 02/26/2020   Walked on RA x one lap =  approx 250 ft -@ slow  Pace with cane stopped due to ankle pain /min  sob with sats 93%    No further w/u needed > encouraged to stay as active as her geriatric restraints (balance and orthopedic limits) allow and f/u if doe worse.

## 2020-02-26 NOTE — Assessment & Plan Note (Signed)
Onset 2010  - Improved 2012  on Zyrtec typical of upper airway cough syndrome   - worse off zyrtec / gerd rx 06/06/2018 so rec rechallenge with zyrtec/ ppi/h1hs and f/u in 6 weeks  - Allergy profile 06/23/18  >  Eos 0.2 /  IgE  4 RAST neg  - Allergy profile 09/27/2019 >  Eos 0.3 /  IgE  3 - MBS  11/20/19 mild aspiration risk rec SLP Diet Recommendations Regular solids;Thin liquid  Liquid Administration via Cup;Straw  Medication Administration Whole meds with liquid  Compensations Slow rate;Small sips/bites  Postural Changes Remain semi-upright after after feeds/meals (Comment);Seated upright at 90 degrees      clearly improved on GERD Rx.  NB the  ramp to expected improvement in symptoms from an empiric trial of PPI (and for that matter, worsening, if a chronic effective medication is stopped)  can be measured in weeks, not days, a common misconception because this is not the same as treating heartburn (no immediate cause and effect relationship)  so that response to therapy or lack thereof can be very difficult to assess especially if the patient is not adherent to the treatment plan which includes dietary restrictions.  She has finally improved and no further w/u needed with refills per PCP or return here in a year or if cough recurs while still on gerd rx          Each maintenance medication was reviewed in detail including emphasizing most importantly the difference between maintenance and prns and under what circumstances the prns are to be triggered using an action plan format where appropriate.  Total time for H and P, chart review, counseling, teaching device and generating customized AVS unique to this office visit / charting = 22 min

## 2020-02-26 NOTE — Patient Instructions (Signed)
We will walk you today to get a baseline for future reference if your breathing gets worse  Keep walking at your nursing home and if you need [place] to evaluate how you're doing please bring your walker    Stay on acid suppression as you are as they appear to have to have helped your swallowing.     If you are satisfied with your treatment plan,  let your doctor know and he/she can either refill your medications or you can return here when your prescription runs out.     If in any way you are not 100% satisfied,  please tell us.  If 100% better, tell your friends!  Pulmonary follow up is as needed

## 2020-03-04 ENCOUNTER — Other Ambulatory Visit: Payer: Self-pay | Admitting: Internal Medicine

## 2020-03-10 DIAGNOSIS — M5416 Radiculopathy, lumbar region: Secondary | ICD-10-CM | POA: Diagnosis not present

## 2020-03-10 DIAGNOSIS — Z9889 Other specified postprocedural states: Secondary | ICD-10-CM | POA: Diagnosis not present

## 2020-03-21 ENCOUNTER — Telehealth: Payer: Self-pay | Admitting: Internal Medicine

## 2020-03-25 MED ORDER — MELOXICAM 7.5 MG PO TABS: ORAL_TABLET | ORAL | 2 refills | Status: DC

## 2020-03-25 NOTE — Telephone Encounter (Signed)
Can try meloxicam 7.5 mg PRN daily. Can give #30 tabs with 2 refills.

## 2020-03-25 NOTE — Addendum Note (Signed)
Addended by: Westley Hummer B on: 03/25/2020 04:13 PM   Modules accepted: Orders

## 2020-03-25 NOTE — Telephone Encounter (Signed)
rx sent. Left message on machine for patient.  Medication list updated

## 2020-03-25 NOTE — Telephone Encounter (Signed)
Patient called and stated that she went to pick up her medication Celecoxib and it was too expensive and wanted to see if there was something else provider can send to the pharmacy that she can afford, please advise. CB is [phone]

## 2020-04-02 ENCOUNTER — Telehealth: Payer: Self-pay | Admitting: Internal Medicine

## 2020-04-02 NOTE — Telephone Encounter (Signed)
The patient picked up meloxicam (MOBIC) 7.5 MG tablet and she's scared to take the medication because of what the paper was saying that it can cause.  She wants to get the okay from Dr. Jerilee Hoh before she starts taking this medication.  Please advsie

## 2020-04-03 NOTE — Telephone Encounter (Signed)
Left message on machine for patient to return our call 

## 2020-04-03 NOTE — Telephone Encounter (Signed)
Same class of medication as celecoxib. If used just PRN will be ok.

## 2020-04-07 ENCOUNTER — Telehealth: Payer: Self-pay | Admitting: Internal Medicine

## 2020-04-07 NOTE — Telephone Encounter (Signed)
Pt is calling in stating that she is not going to take the meloxicam (MOBIC) 7.5 MG due to it being very dangerous and would like to see if she can go back on the celecoxib (CELEBREX0 200 MG and will have to just fight the high cost of the medication instead of taking something very dangerous.  Pt stated that she will be home until 3 pm from 4 pm.

## 2020-04-10 ENCOUNTER — Other Ambulatory Visit: Payer: Self-pay | Admitting: Internal Medicine

## 2020-04-15 NOTE — Telephone Encounter (Signed)
Please let her know that they are the same class of medication and thus have the same side effect profile. However, ok to switch back to celecoxib if she prefers.

## 2020-04-16 NOTE — Telephone Encounter (Signed)
Left message on machine for patient to return our call 

## 2020-04-21 ENCOUNTER — Telehealth: Payer: Self-pay | Admitting: Internal Medicine

## 2020-04-21 DIAGNOSIS — M542 Cervicalgia: Secondary | ICD-10-CM

## 2020-04-21 NOTE — Telephone Encounter (Signed)
[facility]  [phone]  for Neck pain. Please call pt at the home number when done ( [phone] )

## 2020-04-22 NOTE — Telephone Encounter (Signed)
Ok for PT referral per patient request.

## 2020-04-22 NOTE — Telephone Encounter (Signed)
Attempted to call patient but unable to leave a message  Referral placed

## 2020-04-22 NOTE — Telephone Encounter (Signed)
2nd attempt to call patient but unable to leave a message. 

## 2020-04-23 NOTE — Telephone Encounter (Signed)
3rd attempt to call patient Unable to leave a message.

## 2020-04-25 DIAGNOSIS — M5136 Other intervertebral disc degeneration, lumbar region: Secondary | ICD-10-CM | POA: Diagnosis not present

## 2020-04-25 DIAGNOSIS — Z9889 Other specified postprocedural states: Secondary | ICD-10-CM | POA: Diagnosis not present

## 2020-04-25 DIAGNOSIS — M5416 Radiculopathy, lumbar region: Secondary | ICD-10-CM | POA: Diagnosis not present

## 2020-04-25 DIAGNOSIS — M48061 Spinal stenosis, lumbar region without neurogenic claudication: Secondary | ICD-10-CM | POA: Diagnosis not present

## 2020-04-27 ENCOUNTER — Other Ambulatory Visit: Payer: Self-pay | Admitting: Cardiology

## 2020-04-27 DIAGNOSIS — I10 Essential (primary) hypertension: Secondary | ICD-10-CM

## 2020-04-27 DIAGNOSIS — E785 Hyperlipidemia, unspecified: Secondary | ICD-10-CM

## 2020-04-27 DIAGNOSIS — I471 Supraventricular tachycardia: Secondary | ICD-10-CM

## 2020-04-28 NOTE — Telephone Encounter (Signed)
Needs appointment for future refill

## 2020-04-30 DIAGNOSIS — M542 Cervicalgia: Secondary | ICD-10-CM | POA: Diagnosis not present

## 2020-04-30 DIAGNOSIS — M6281 Muscle weakness (generalized): Secondary | ICD-10-CM | POA: Diagnosis not present

## 2020-05-05 DIAGNOSIS — M542 Cervicalgia: Secondary | ICD-10-CM | POA: Diagnosis not present

## 2020-05-05 DIAGNOSIS — M6281 Muscle weakness (generalized): Secondary | ICD-10-CM | POA: Diagnosis not present

## 2020-05-08 ENCOUNTER — Other Ambulatory Visit: Payer: Self-pay | Admitting: Internal Medicine

## 2020-05-08 MED ORDER — FAMOTIDINE 20 MG PO TABS: 20.0000 mg | ORAL_TABLET | Freq: Every day | ORAL | 4 refills | Status: DC

## 2020-05-08 NOTE — Telephone Encounter (Signed)
Paper refill Famotidine 20 mg sent.

## 2020-05-09 ENCOUNTER — Telehealth: Payer: Self-pay | Admitting: Internal Medicine

## 2020-05-09 DIAGNOSIS — M6281 Muscle weakness (generalized): Secondary | ICD-10-CM | POA: Diagnosis not present

## 2020-05-09 DIAGNOSIS — M542 Cervicalgia: Secondary | ICD-10-CM | POA: Diagnosis not present

## 2020-05-09 NOTE — Telephone Encounter (Signed)
Attempted to call patient, but unable to leave a message. 

## 2020-05-09 NOTE — Telephone Encounter (Signed)
Pt is calling in stating that since she has been taking amlodipine (NORVASC) 5 MG her feet and ankles has been swelling and she can't get any kind of shoes on but her walking shoe (tennis shoes).  Pt would like to see if she can stop taking the medication to see if the swelling will go done.  Pt would like to have a call back to see if she can stop the medication today.

## 2020-05-09 NOTE — Telephone Encounter (Signed)
Cannot give advice as I have not seen her in >1 year. Also this medication is prescribed by cardiology.

## 2020-05-13 DIAGNOSIS — M5416 Radiculopathy, lumbar region: Secondary | ICD-10-CM | POA: Diagnosis not present

## 2020-05-13 DIAGNOSIS — M542 Cervicalgia: Secondary | ICD-10-CM | POA: Diagnosis not present

## 2020-05-13 DIAGNOSIS — M6281 Muscle weakness (generalized): Secondary | ICD-10-CM | POA: Diagnosis not present

## 2020-05-15 DIAGNOSIS — M542 Cervicalgia: Secondary | ICD-10-CM | POA: Diagnosis not present

## 2020-05-15 DIAGNOSIS — M6281 Muscle weakness (generalized): Secondary | ICD-10-CM | POA: Diagnosis not present

## 2020-05-19 ENCOUNTER — Other Ambulatory Visit: Payer: Self-pay

## 2020-05-20 ENCOUNTER — Ambulatory Visit: Payer: Medicare HMO | Admitting: Internal Medicine

## 2020-05-20 DIAGNOSIS — M6281 Muscle weakness (generalized): Secondary | ICD-10-CM | POA: Diagnosis not present

## 2020-05-20 DIAGNOSIS — M542 Cervicalgia: Secondary | ICD-10-CM | POA: Diagnosis not present

## 2020-05-21 NOTE — Telephone Encounter (Signed)
Left message on machine for patient to return our call 

## 2020-05-22 DIAGNOSIS — M542 Cervicalgia: Secondary | ICD-10-CM | POA: Diagnosis not present

## 2020-05-22 DIAGNOSIS — M6281 Muscle weakness (generalized): Secondary | ICD-10-CM | POA: Diagnosis not present

## 2020-05-28 DIAGNOSIS — M6281 Muscle weakness (generalized): Secondary | ICD-10-CM | POA: Diagnosis not present

## 2020-05-28 DIAGNOSIS — M542 Cervicalgia: Secondary | ICD-10-CM | POA: Diagnosis not present

## 2020-05-31 ENCOUNTER — Other Ambulatory Visit: Payer: Self-pay | Admitting: Internal Medicine

## 2020-06-03 DIAGNOSIS — M5416 Radiculopathy, lumbar region: Secondary | ICD-10-CM | POA: Diagnosis not present

## 2020-06-04 DIAGNOSIS — M6281 Muscle weakness (generalized): Secondary | ICD-10-CM | POA: Diagnosis not present

## 2020-06-04 DIAGNOSIS — M542 Cervicalgia: Secondary | ICD-10-CM | POA: Diagnosis not present

## 2020-06-10 ENCOUNTER — Ambulatory Visit: Payer: Medicare HMO | Admitting: Internal Medicine

## 2020-06-12 DIAGNOSIS — L821 Other seborrheic keratosis: Secondary | ICD-10-CM | POA: Diagnosis not present

## 2020-06-12 DIAGNOSIS — Z85828 Personal history of other malignant neoplasm of skin: Secondary | ICD-10-CM | POA: Diagnosis not present

## 2020-06-12 DIAGNOSIS — D485 Neoplasm of uncertain behavior of skin: Secondary | ICD-10-CM | POA: Diagnosis not present

## 2020-06-12 DIAGNOSIS — C44319 Basal cell carcinoma of skin of other parts of face: Secondary | ICD-10-CM | POA: Diagnosis not present

## 2020-06-12 DIAGNOSIS — D225 Melanocytic nevi of trunk: Secondary | ICD-10-CM | POA: Diagnosis not present

## 2020-06-13 DIAGNOSIS — M6281 Muscle weakness (generalized): Secondary | ICD-10-CM | POA: Diagnosis not present

## 2020-06-13 DIAGNOSIS — M542 Cervicalgia: Secondary | ICD-10-CM | POA: Diagnosis not present

## 2020-06-17 ENCOUNTER — Other Ambulatory Visit: Payer: Self-pay

## 2020-06-17 ENCOUNTER — Encounter: Payer: Self-pay | Admitting: Internal Medicine

## 2020-06-17 ENCOUNTER — Ambulatory Visit (INDEPENDENT_AMBULATORY_CARE_PROVIDER_SITE_OTHER): Payer: Medicare HMO | Admitting: Internal Medicine

## 2020-06-17 VITALS — BP 130/80 | HR 80 | Temp 98.0°F | Wt 163.9 lb

## 2020-06-17 DIAGNOSIS — F32A Depression, unspecified: Secondary | ICD-10-CM

## 2020-06-17 DIAGNOSIS — M159 Polyosteoarthritis, unspecified: Secondary | ICD-10-CM

## 2020-06-17 DIAGNOSIS — N182 Chronic kidney disease, stage 2 (mild): Secondary | ICD-10-CM

## 2020-06-17 DIAGNOSIS — R600 Localized edema: Secondary | ICD-10-CM | POA: Diagnosis not present

## 2020-06-17 DIAGNOSIS — R413 Other amnesia: Secondary | ICD-10-CM | POA: Diagnosis not present

## 2020-06-17 DIAGNOSIS — I5032 Chronic diastolic (congestive) heart failure: Secondary | ICD-10-CM

## 2020-06-17 DIAGNOSIS — M8949 Other hypertrophic osteoarthropathy, multiple sites: Secondary | ICD-10-CM

## 2020-06-17 DIAGNOSIS — I10 Essential (primary) hypertension: Secondary | ICD-10-CM | POA: Diagnosis not present

## 2020-06-17 DIAGNOSIS — R059 Cough, unspecified: Secondary | ICD-10-CM

## 2020-06-17 NOTE — Patient Instructions (Signed)
-  Nice seeing you today!!  -Schedule follow up in 3 months for your physical. Please come in fasting that day. 

## 2020-06-17 NOTE — Progress Notes (Signed)
Established Patient Office Visit     This visit occurred during the SARS-CoV-2 public health emergency.  Safety protocols were in place, including screening questions prior to the visit, additional usage of staff PPE, and extensive cleaning of exam room while observing appropriate contact time as indicated for disinfecting solutions.    CC/Reason for Visit: Follow-up chronic conditions  HPI: [name] is a 87 y.o. female who is coming in today for the above mentioned reasons. Past Medical History is significant for: Depression, hyperlipidemia, hypertension, chronic cough thought due to GERD as well as a multitude of orthopedic/arthritic complaints.  She is being followed by physical therapy.  She gets epidural injections about three times a year.  She is living at [facility].  There are no acute concerns today.  She is fully vaccinated against COVID and influenza.   Past Medical/Surgical History: Past Medical History:  Diagnosis Date  . Anxiety and depression   . Back pain    lumbar  . Fecal incontinence    Dr. Michail Sermon, likely due to loose stools in conjunction with pelvic muscle laxity from aging, recommended to stop Miralax  . GI (gastrointestinal bleed)    hx of colon polyps 2004, diverticulosis, internal hemmorhids  . Headache   . Headache(784.0)   . Hydronephrosis    Right 2008 s/p urology eval CT/cysto: observation/monitor  . Hyperlipidemia   . Hypertension   . Knee pain    right chronic  . Migraine    on topamax , per neuro  . Osteoporosis   . Pulmonary nodules    Wert. First seen by CT only 05/04/03, no change on f/u 08/08/06 -Macroscopic changes rml 04/27/07 > resolved april 6,2010 no further w/u   . Seborrheic keratosis    Left inferior posterior flank  . SVT (supraventricular tachycardia) (HCC)    sees Dr.Ross    Past Surgical History:  Procedure Laterality Date  . ABDOMINAL HYSTERECTOMY    . APPENDECTOMY    . BASAL CELL CARCINOMA  EXCISION    . BLADDER REPAIR    . CATARACT EXTRACTION  2008   Dr.Digby  . CHOLECYSTECTOMY    . lumbar reconstructive surgery  01/07/2009  . R ovary removed    . ROTATOR CUFF REPAIR  9/-2011  . SHOULDER SURGERY  12-2011   LEFT  . TONSILLECTOMY    . TOTAL KNEE ARTHROPLASTY     right  . trigger finger surgery     L thumb (2014), R ring finger 05-2013    Social History:  reports that she has never smoked. She has never used smokeless tobacco. She reports current alcohol use of about 1.0 standard drink of alcohol per week. She reports that she does not use drugs.  Allergies: Allergies  Allergen Reactions  . Amitriptyline Hcl     REACTION: hallucinations  . Carisoprodol     REACTION: nightmares,rash,tachycardia  . Codeine     REACTION: "deathly ill" nauesa, dizzy,weak  . Fosamax [Alendronate Sodium] Other (See Comments)    Dental issues  . Meloxicam     REACTION: sensitive to sun; facial redness; may cause severe abd pain  . Metronidazole     REACTION: abd pain  . Nortriptyline Hcl     REACTION: hallucinations  . Oxaprozin     REACTION: abd pain  . Promethazine Hcl     REACTION: spastic arms and legs flailing while awake and unusual behavior while sleep - walking all over house during night  . Propranolol  Other (See Comments)    hallucinations  . Rofecoxib     REACTION: rash  . Sumatriptan     REACTION: abdominal pain, blurry vision, drowsiness  . Tramadol Other (See Comments)    Dizziness and constipation  . Venlafaxine     Hallucinations     Family History:  Family History  Problem Relation Age of Onset  . Breast cancer Mother        later in life  . Diabetes Mother        late in life  . Heart disease Mother        late onset  . Lung cancer Father        apparently had TB  . Heart disease Father        late onset  . Prostate cancer Father   . Colitis Brother   . Cancer Brother   . Colon cancer Neg Hx      Current Outpatient Medications:  .   acetaminophen (TYLENOL) 325 MG tablet, Take 650 mg by mouth every 6 (six) hours as needed., Disp: , Rfl:  .  atorvastatin (LIPITOR) 40 MG tablet, TAKE 1 TABLET BY MOUTH EVERYDAY AT BEDTIME, Disp: 90 tablet, Rfl: 1 .  Bismuth Subsalicylate (PEPTO-BISMOL PO), Take 1 tablet by mouth daily as needed. , Disp: , Rfl:  .  Ca [id number] (CALCIUM-FOLIC ACID PLUS D PO), Take by mouth daily., Disp: , Rfl:  .  celecoxib (CELEBREX) 200 MG capsule, TAKE 1 CAPSULE BY MOUTH EVERY DAY AS NEEDED FOR MODERATE PAIN, Disp: 90 capsule, Rfl: 1 .  CRANBERRY PO, Take 1 capsule by mouth daily., Disp: , Rfl:  .  dextromethorphan-guaiFENesin (MUCINEX DM) 30-600 MG 12hr tablet, Take 1 tablet by mouth 2 (two) times daily as needed for cough., Disp: , Rfl:  .  escitalopram (LEXAPRO) 10 MG tablet, TAKE 1 TABLET BY MOUTH EVERY DAY, Disp: 90 tablet, Rfl: 0 .  famotidine (PEPCID) 20 MG tablet, Take 1 tablet (20 mg total) by mouth daily., Disp: 30 tablet, Rfl: 4 .  gabapentin (NEURONTIN) 100 MG capsule, TAKE 2-3 CAPSULES (200-300 MG TOTAL) BY MOUTH 3 (THREE) TIMES DAILY., Disp: 810 capsule, Rfl: 1 .  Liniments (BLUE-EMU SUPER STRENGTH) CREA, Apply 1 application topically at bedtime as needed (dry skin). , Disp: , Rfl:  .  loperamide (IMODIUM A-D) 2 MG tablet, Take 2 mg by mouth 4 (four) times daily as needed for diarrhea or loose stools. , Disp: , Rfl:  .  NON FORMULARY, Take 2 capsules by mouth 2 (two) times daily. [employer]	[place], Disp: , Rfl:  .  olopatadine (PATANOL) 0.1 % ophthalmic solution, Place 1 drop into both eyes 2 (two) times daily. As directed, Disp: , Rfl:  .  pantoprazole (PROTONIX) 40 MG tablet, TAKE 1 TABLET (40 MG TOTAL) BY MOUTH DAILY. TAKE 30-60 MIN BEFORE FIRST MEAL OF THE DAY, Disp: 90 tablet, Rfl: 3 .  Phenylephrine-Acetaminophen (SINUS PRESSURE + PAIN) 5-325 MG TABS, Take by mouth as directed., Disp: , Rfl:  .  Potassium Chloride ER 20 MEQ TBCR, TAKE 1 TABLET BY MOUTH EVERY DAY, Disp: 90 tablet, Rfl:  0 .  Probiotic Product (PROBIOTIC DAILY PO), Take 1 capsule by mouth daily. , Disp: , Rfl:  .  simethicone (MYLICON) 80 MG chewable tablet, Chew 80 mg by mouth every 6 (six) hours as needed for flatulence., Disp: , Rfl:  .  amLODipine (NORVASC) 5 MG tablet, Take 1 tablet (5 mg total) by mouth daily. NEEDS APPOINTMENT FOR  FUTURE REFILL / 1st Attempt (Patient not taking: Reported on 06/17/2020), Disp: 90 tablet, Rfl: 0 .  loratadine (CLARITIN) 10 MG tablet, TAKE 1 TABLET BY MOUTH EVERY DAY (Patient not taking: Reported on 06/17/2020), Disp: 90 tablet, Rfl: 1 .  meloxicam (MOBIC) 7.5 MG tablet, Take one tab daily as neede (Patient not taking: Reported on 06/17/2020), Disp: 30 tablet, Rfl: 2  Review of Systems:  Constitutional: Denies fever, chills, diaphoresis, appetite change and fatigue.  HEENT: Denies photophobia, eye pain, redness, hearing loss, ear pain, congestion, sore throat, rhinorrhea, sneezing, mouth sores, trouble swallowing, neck pain, neck stiffness and tinnitus.   Respiratory: Denies SOB, DOE, cough, chest tightness,  and wheezing.   Cardiovascular: Denies chest pain, palpitations and leg swelling.  Gastrointestinal: Denies nausea, vomiting, abdominal pain, diarrhea, constipation, blood in stool and abdominal distention.  Genitourinary: Denies dysuria, urgency, frequency, hematuria, flank pain and difficulty urinating.  Endocrine: Denies: hot or cold intolerance, sweats, changes in hair or nails, polyuria, polydipsia. Musculoskeletal: Positive for myalgias, back pain, joint swelling, arthralgias and gait problem.  Skin: Denies pallor, rash and wound.  Neurological: Denies dizziness, seizures, syncope, weakness, light-headedness, numbness and headaches.  Hematological: Denies adenopathy. Easy bruising, personal or family bleeding history  Psychiatric/Behavioral: Denies suicidal ideation, mood changes, confusion, nervousness, sleep disturbance and agitation    Physical Exam: Vitals:    06/17/20 1124  BP: 130/80  Pulse: 80  Temp: 98 F (36.7 C)  TempSrc: Oral  SpO2: 95%  Weight: 163 lb 14.4 oz (74.3 kg)    Body mass index is 27.7 kg/m.   Constitutional: NAD, calm, comfortable, walks with a cane Eyes: PERRL, lids and conjunctivae normal ENMT: Mucous membranes are moist.  Respiratory: clear to auscultation bilaterally, no wheezing, no crackles. Normal respiratory effort. No accessory muscle use.  Cardiovascular: Regular rate and rhythm, no murmurs / rubs / gallops. No extremity edema.  Psychiatric: Normal judgment and insight. Alert and oriented x 3. Normal mood.    Impression and Plan:  Chronic heart failure with preserved ejection fraction (HCC) Edema of both lower extremities -She has some mild lower extremity edema today that is thought to mainly to chronic venous insufficiency aneurysmal component of diastolic heart failure, however she refuses to wear compression stockings.  She also refuses to take diuretics due to concerns with urinary incontinence.  Memory loss -She is followed by neurology and neuropsychology.  Essential hypertension -Well-controlled.  Depression, unspecified depression type -Mood appears stable, she is on Lexapro.  Primary osteoarthritis involving multiple joints -She takes muscle relaxers, meloxicam as needed as well as epidural injections.  CKD (chronic kidney disease) stage 2, GFR 60-89 ml/min -Most recent creatinine was 0.980 in August 2021.  Cough -No recent issues, she continues on PPI therapy.   Patient Instructions  -Nice seeing you today!!  -Schedule follow up in 3 months for your physical. Please come in fasting that day.     Lelon Frohlich, MD [facility]

## 2020-06-18 DIAGNOSIS — M5442 Lumbago with sciatica, left side: Secondary | ICD-10-CM | POA: Diagnosis not present

## 2020-06-18 DIAGNOSIS — M6281 Muscle weakness (generalized): Secondary | ICD-10-CM | POA: Diagnosis not present

## 2020-06-18 DIAGNOSIS — M542 Cervicalgia: Secondary | ICD-10-CM | POA: Diagnosis not present

## 2020-06-19 ENCOUNTER — Ambulatory Visit: Payer: Medicare HMO | Admitting: Neurology

## 2020-06-19 ENCOUNTER — Other Ambulatory Visit: Payer: Self-pay

## 2020-06-19 ENCOUNTER — Other Ambulatory Visit: Payer: Self-pay | Admitting: Internal Medicine

## 2020-06-19 ENCOUNTER — Encounter: Payer: Self-pay | Admitting: Neurology

## 2020-06-19 VITALS — BP 155/85 | HR 86 | Ht 64.0 in | Wt 165.0 lb

## 2020-06-19 DIAGNOSIS — G3184 Mild cognitive impairment, so stated: Secondary | ICD-10-CM | POA: Diagnosis not present

## 2020-06-19 MED ORDER — FAMOTIDINE 20 MG PO TABS: 20.0000 mg | ORAL_TABLET | Freq: Every day | ORAL | 6 refills | Status: DC

## 2020-06-19 NOTE — Patient Instructions (Addendum)
1. Schedule MRI brain without contrast  2. Try reducing gabapentin by 1 capsule and see if this helps with memory, however if pain worsens, go back on original dose  3. Continue working with [facility] and your pain doctor  4. Follow-up in 6-8 months, call for any changes   RECOMMENDATIONS FOR ALL PATIENTS WITH MEMORY PROBLEMS: 1. Continue to exercise (Recommend 30 minutes of walking everyday, or 3 hours every week) 2. Increase social interactions - continue going to [place] and enjoy social gatherings with friends and family 3. Eat healthy, avoid fried foods and eat more fruits and vegetables 4. Maintain adequate blood pressure, blood sugar, and blood cholesterol level. Reducing the risk of stroke and cardiovascular disease also helps promoting better memory. 5. Avoid stressful situations. Live a simple life and avoid aggravations. Organize your time and prepare for the next day in anticipation. 6. Sleep well, avoid any interruptions of sleep and avoid any distractions in the bedroom that may interfere with adequate sleep quality 7. Avoid sugar, avoid sweets as there is a strong link between excessive sugar intake, diabetes, and cognitive impairment The Mediterranean diet has been shown to help patients reduce the risk of progressive memory disorders and reduces cardiovascular risk. This includes eating fish, eat fruits and green leafy vegetables, nuts like almonds and hazelnuts, walnuts, and also use olive oil. Avoid fast foods and fried foods as much as possible. Avoid sweets and sugar as sugar use has been linked to worsening of memory function.

## 2020-06-19 NOTE — Progress Notes (Signed)
NEUROLOGY FOLLOW UP OFFICE NOTE  [name] [id number] 1930-01-21  HISTORY OF PRESENT ILLNESS: I had the pleasure of seeing [name] in follow-up in the neurology clinic on 06/19/2020. She is alone in the office today. Records and images were personally reviewed where available.  She underwent Neuropsychological evaluation in 12/2019 with a diagnosis of Mild Cognitive Impairment, with very mild dimunition in measures of language function and one memory test, barely reaching the threshold of abnormality, no indication of a neurodegenerative process. She has not done brain MRI yet. She has not been feeling well due to her back and hip pain. Her back hurts very badly, radiating to the left hip, she is in pain all the time. She fell 2 years ago and her bones crack every time she turns her head. She has done PT and injections with no improvement. Gabapentin helps with her sciatica. She does PT with [facility] at her [place] in [place], they have given her lots of things to read. She denies forgetting medications or bill payments, but she is forgetting some other things. For instance, she mixed up the address coming here today, she wrote the right address but put in parenthesis the wrong marker "[address] She denies any dizziness, no falls, she uses her cane and has a walker if needed. No further hallucinations since stopping Propranolol.    History on Initial Assessment 11/13/2019: This is a pleasant 87 year old right-handed woman with a history of hypertension, hyperlipidemia, SVT, migraines, anxiety, depression, presenting for evaluation of memory loss. She lives at [facility] and has been going to [facility] for her memory and falls and feels better. She states her memory is not as good as she wishes. She started noticing memory changes around 5 years ago, she would forget where her fridge was while she was in the kitchen. She continues to manage her medications and  finances and denies any issues. She states her son wanted her to turn it over to him but she refused, "he disclaimed me and is not calling me anymore." She does not have a car. She reports he would not take her to the closing of her home and he told some of her doctors one day that he could say she was failing and he needed to take charge of her health, then told the managers at [facility] that he was through with her. She moved to [place] in December 2020. She has a sister-in-law and nephew who keep an eye on her and call her every few days. Her niece in [place] comes once a year, they talk on the phone and has told her she could not remember some things. She reports troubling hallucinations, but she figured out herself that it was the Propranolol and after stopping it several months ago, the hallucinations stopped. She was having auditory>visual hallucinations, hearing people in the living room, she thought she had been in a meeting with some of them. One night she needed to use the bathroom and saw 2 women sitting on a bench at the foot of her bed talking to each other. There is a family history of dementia in her younger deceased brother, maternal aunt, and cousin.   She has occasional migraines. She has left hip pain. She fell in 2019 and continues to have pain in the back of her head and neck. She denies any dizziness, diplopia, dysarthria/dysphagia, focal numbness/tingling/weakness, bowel/bladder dysfunction, anosmia, or tremors. She has done PT at 3 different  places, working on her balance. She uses her walker or cane most of the time, but comes today with no assistive devices. No recent falls.   I personally reviewed head CT without contrast done 09/2018 with no acute changes, there was age-related diffuse atrophy and chronic microvascular disease.   Laboratory Data: Lab Results  Component Value Date   TSH 2.15 09/27/2019   Lab Results  Component Value Date   VITAMINB12 256  11/23/2018     PAST MEDICAL HISTORY: Past Medical History:  Diagnosis Date  . Anxiety and depression   . Back pain    lumbar  . Fecal incontinence    Dr. Michail Sermon, likely due to loose stools in conjunction with pelvic muscle laxity from aging, recommended to stop Miralax  . GI (gastrointestinal bleed)    hx of colon polyps 2004, diverticulosis, internal hemmorhids  . Headache   . Headache(784.0)   . Hydronephrosis    Right 2008 s/p urology eval CT/cysto: observation/monitor  . Hyperlipidemia   . Hypertension   . Knee pain    right chronic  . Migraine    on topamax , per neuro  . Osteoporosis   . Pulmonary nodules    [facility]. First seen by CT only 05/04/03, no change on f/u 08/08/06 -Macroscopic changes rml 04/27/07 > resolved april 6,2010 no further w/u   . Seborrheic keratosis    Left inferior posterior flank  . SVT (supraventricular tachycardia) ([place])    sees Dr.Ross    MEDICATIONS: Current Outpatient Medications on File Prior to Visit  Medication Sig Dispense Refill  . acetaminophen (TYLENOL) 325 MG tablet Take 650 mg by mouth every 6 (six) hours as needed.    Marland Kitchen amoxicillin-clavulanate (AUGMENTIN) 875-125 MG tablet Take 1 tablet by mouth once. Prior to Dentist appts.    Marland Kitchen atorvastatin (LIPITOR) 40 MG tablet TAKE 1 TABLET BY MOUTH EVERYDAY AT BEDTIME 90 tablet 1  . Bismuth Subsalicylate (PEPTO-BISMOL PO) Take 1 tablet by mouth daily as needed.     . celecoxib (CELEBREX) 200 MG capsule TAKE 1 CAPSULE BY MOUTH EVERY DAY AS NEEDED FOR MODERATE PAIN 90 capsule 1  . CRANBERRY PO Take 1 capsule by mouth daily.    . cyclobenzaprine (FLEXERIL) 10 MG tablet Take 10 mg by mouth 3 (three) times daily.    Marland Kitchen gabapentin (NEURONTIN) 100 MG capsule TAKE 2-3 CAPSULES (200-300 MG TOTAL) BY MOUTH 3 (THREE) TIMES DAILY. 810 capsule 1  . Menthol, Topical Analgesic, (BLUE-EMU MAXIMUM STRENGTH EX) Apply topically. Apply topically as needed.    . Multiple Vitamins-Minerals (CENTRUM SILVER 50+WOMEN)  TABS take 1 tablet by oral route every day    . NON FORMULARY Take 2 capsules by mouth 2 (two) times daily. Hydro Eye    . pantoprazole (PROTONIX) 40 MG tablet TAKE 1 TABLET (40 MG TOTAL) BY MOUTH DAILY. TAKE 30-60 MIN BEFORE FIRST MEAL OF THE DAY 90 tablet 3  . Probiotic Product (PROBIOTIC DAILY PO) Take 1 capsule by mouth daily.     Marland Kitchen amLODipine (NORVASC) 5 MG tablet Take 1 tablet (5 mg total) by mouth daily. NEEDS APPOINTMENT FOR FUTURE REFILL / 1st Attempt (Patient not taking: No sig reported) 90 tablet 0  . Ca [id number] (CALCIUM-FOLIC ACID PLUS D PO) Take by mouth daily.    Marland Kitchen dextromethorphan-guaiFENesin (MUCINEX DM) 30-600 MG 12hr tablet Take 1 tablet by mouth 2 (two) times daily as needed for cough.    . escitalopram (LEXAPRO) 10 MG tablet TAKE 1 TABLET BY MOUTH EVERY  DAY 90 tablet 0  . famotidine (PEPCID) 20 MG tablet Take 1 tablet (20 mg total) by mouth daily. 30 tablet 4  . Liniments (BLUE-EMU SUPER STRENGTH) CREA Apply 1 application topically at bedtime as needed (dry skin).     Marland Kitchen loperamide (IMODIUM A-D) 2 MG tablet Take 2 mg by mouth 4 (four) times daily as needed for diarrhea or loose stools.     Marland Kitchen loratadine (CLARITIN) 10 MG tablet TAKE 1 TABLET BY MOUTH EVERY DAY (Patient not taking: No sig reported) 90 tablet 1  . meloxicam (MOBIC) 7.5 MG tablet Take one tab daily as neede (Patient not taking: No sig reported) 30 tablet 2  . olopatadine (PATANOL) 0.1 % ophthalmic solution Place 1 drop into both eyes 2 (two) times daily. As directed    . Phenylephrine-Acetaminophen (SINUS PRESSURE + PAIN) 5-325 MG TABS Take by mouth as directed.    . Potassium Chloride ER 20 MEQ TBCR TAKE 1 TABLET BY MOUTH EVERY DAY 90 tablet 0  . simethicone (MYLICON) 80 MG chewable tablet Chew 80 mg by mouth every 6 (six) hours as needed for flatulence.     No current facility-administered medications on file prior to visit.    ALLERGIES: Allergies  Allergen Reactions  . Amitriptyline Hcl      REACTION: hallucinations  . Carisoprodol     REACTION: nightmares,rash,tachycardia  . Codeine     REACTION: "deathly ill" nauesa, dizzy,weak  . Fosamax [Alendronate Sodium] Other (See Comments)    Dental issues  . Meloxicam     REACTION: sensitive to sun; facial redness; may cause severe abd pain  . Metronidazole     REACTION: abd pain  . Nortriptyline Hcl     REACTION: hallucinations  . Oxaprozin     REACTION: abd pain  . Promethazine Hcl     REACTION: spastic arms and legs flailing while awake and unusual behavior while sleep - walking all over house during night  . Propranolol Other (See Comments)    hallucinations  . Rofecoxib     REACTION: rash  . Sumatriptan     REACTION: abdominal pain, blurry vision, drowsiness  . Tramadol Other (See Comments)    Dizziness and constipation  . Venlafaxine     Hallucinations     FAMILY HISTORY: Family History  Problem Relation Age of Onset  . Breast cancer Mother        later in life  . Diabetes Mother        late in life  . Heart disease Mother        late onset  . Lung cancer Father        apparently had TB  . Heart disease Father        late onset  . Prostate cancer Father   . Colitis Brother   . Cancer Brother   . Colon cancer Neg Hx     SOCIAL HISTORY: Social History   Socioeconomic History  . Marital status: Widowed    Spouse name: Not on file  . Number of children: 1  . Years of education: college   . Highest education level: Doctorate  Occupational History  . Occupation: retired   Tobacco Use  . Smoking status: Never Smoker  . Smokeless tobacco: Never Used  Vaping Use  . Vaping Use: Never used  Substance and Sexual Activity  . Alcohol use: Yes    Alcohol/week: 1.0 standard drink    Types: 1 Glasses of wine per week  Comment: rarely, 1 glass of wine monthly  . Drug use: No  . Sexual activity: Not Currently  Other Topics Concern  . Not on file  Social History Narrative   Moved from a retirement  home to a condominium 04-2013, independent living,  lives by herself , drives    Son lives in [place]   Right handed    1 cup of caffeine per day       Lives in 3 story home but resides on the 2nd floor    Social Determinants of Health   Financial Resource Strain: Not on file  Food Insecurity: Not on file  Transportation Needs: Not on file  Physical Activity: Not on file  Stress: Not on file  Social Connections: Not on file  Intimate Partner Violence: Not on file     PHYSICAL EXAM: Vitals:   06/19/20 0936  BP: (!) 155/85  Pulse: 86  SpO2: 93%   General: No acute distress Head:  Normocephalic/atraumatic Skin/Extremities: No rash, no edema Neurological Exam: alert and awake. No aphasia or dysarthria. Fund of knowledge is appropriate.  Attention and concentration are normal.   Cranial nerves: Pupils equal, round. Extraocular movements intact with no nystagmus. Visual fields full.  No facial asymmetry.  Motor: Bulk and tone normal, muscle strength 5/5 throughout with no pronator drift.   Finger to nose testing intact.  Gait slow and cautious with cane, no ataxia   IMPRESSION: This is a pleasant 87 yo RH woman with a history of hypertension, hyperlipidemia, SVT, migraines, anxiety, depression, who presented for memory loss. Neuropsychological evaluation indicated Mild Cognitive Impairment, no indication of a neurodegenerative process. Findings discussed with patient, she denies any difficulties with complex tasks but continues to endorse memory problems. We discussed different causes of memory loss, proceed with MRI brain without contrast to assess for underlying structural abnormality. She also reports being in constant pain, which can also affect cognition. She is worried about her medications affecting memory, she can try reducing gabapentin but if pain worsens, resume prior dose, we discussed weighing benefits versus side effects of medications.She does not drive. Follow-up in 6-8  months, call for any changes.   Thank you for allowing me to participate in her care.  Please do not hesitate to call for any questions or concerns.   Ellouise Newer, M.D.   CC: Dr. Deniece Ree

## 2020-06-20 ENCOUNTER — Other Ambulatory Visit: Payer: Self-pay | Admitting: Internal Medicine

## 2020-06-20 DIAGNOSIS — M6281 Muscle weakness (generalized): Secondary | ICD-10-CM | POA: Diagnosis not present

## 2020-06-20 DIAGNOSIS — M542 Cervicalgia: Secondary | ICD-10-CM | POA: Diagnosis not present

## 2020-06-20 DIAGNOSIS — M5442 Lumbago with sciatica, left side: Secondary | ICD-10-CM | POA: Diagnosis not present

## 2020-06-21 ENCOUNTER — Other Ambulatory Visit: Payer: Self-pay | Admitting: Internal Medicine

## 2020-06-25 DIAGNOSIS — M5442 Lumbago with sciatica, left side: Secondary | ICD-10-CM | POA: Diagnosis not present

## 2020-06-25 DIAGNOSIS — M6281 Muscle weakness (generalized): Secondary | ICD-10-CM | POA: Diagnosis not present

## 2020-06-25 DIAGNOSIS — M542 Cervicalgia: Secondary | ICD-10-CM | POA: Diagnosis not present

## 2020-06-27 DIAGNOSIS — M5442 Lumbago with sciatica, left side: Secondary | ICD-10-CM | POA: Diagnosis not present

## 2020-06-27 DIAGNOSIS — M542 Cervicalgia: Secondary | ICD-10-CM | POA: Diagnosis not present

## 2020-06-27 DIAGNOSIS — M6281 Muscle weakness (generalized): Secondary | ICD-10-CM | POA: Diagnosis not present

## 2020-07-01 DIAGNOSIS — C44319 Basal cell carcinoma of skin of other parts of face: Secondary | ICD-10-CM | POA: Diagnosis not present

## 2020-07-01 DIAGNOSIS — Z85828 Personal history of other malignant neoplasm of skin: Secondary | ICD-10-CM | POA: Diagnosis not present

## 2020-07-04 DIAGNOSIS — M5442 Lumbago with sciatica, left side: Secondary | ICD-10-CM | POA: Diagnosis not present

## 2020-07-04 DIAGNOSIS — M6281 Muscle weakness (generalized): Secondary | ICD-10-CM | POA: Diagnosis not present

## 2020-07-04 DIAGNOSIS — M542 Cervicalgia: Secondary | ICD-10-CM | POA: Diagnosis not present

## 2020-07-11 DIAGNOSIS — M5442 Lumbago with sciatica, left side: Secondary | ICD-10-CM | POA: Diagnosis not present

## 2020-07-11 DIAGNOSIS — M6281 Muscle weakness (generalized): Secondary | ICD-10-CM | POA: Diagnosis not present

## 2020-07-11 DIAGNOSIS — M542 Cervicalgia: Secondary | ICD-10-CM | POA: Diagnosis not present

## 2020-07-17 DIAGNOSIS — M5442 Lumbago with sciatica, left side: Secondary | ICD-10-CM | POA: Diagnosis not present

## 2020-07-17 DIAGNOSIS — M542 Cervicalgia: Secondary | ICD-10-CM | POA: Diagnosis not present

## 2020-07-17 DIAGNOSIS — M6281 Muscle weakness (generalized): Secondary | ICD-10-CM | POA: Diagnosis not present

## 2020-07-23 ENCOUNTER — Other Ambulatory Visit: Payer: Self-pay | Admitting: Internal Medicine

## 2020-07-23 DIAGNOSIS — M542 Cervicalgia: Secondary | ICD-10-CM | POA: Diagnosis not present

## 2020-07-23 DIAGNOSIS — M5442 Lumbago with sciatica, left side: Secondary | ICD-10-CM | POA: Diagnosis not present

## 2020-07-23 DIAGNOSIS — M6281 Muscle weakness (generalized): Secondary | ICD-10-CM | POA: Diagnosis not present

## 2020-07-24 DIAGNOSIS — R488 Other symbolic dysfunctions: Secondary | ICD-10-CM | POA: Diagnosis not present

## 2020-07-24 DIAGNOSIS — R41841 Cognitive communication deficit: Secondary | ICD-10-CM | POA: Diagnosis not present

## 2020-07-25 ENCOUNTER — Other Ambulatory Visit: Payer: Self-pay | Admitting: Internal Medicine

## 2020-07-28 DIAGNOSIS — R41841 Cognitive communication deficit: Secondary | ICD-10-CM | POA: Diagnosis not present

## 2020-07-28 DIAGNOSIS — R488 Other symbolic dysfunctions: Secondary | ICD-10-CM | POA: Diagnosis not present

## 2020-07-29 DIAGNOSIS — H10413 Chronic giant papillary conjunctivitis, bilateral: Secondary | ICD-10-CM | POA: Diagnosis not present

## 2020-07-29 DIAGNOSIS — Z961 Presence of intraocular lens: Secondary | ICD-10-CM | POA: Diagnosis not present

## 2020-07-29 DIAGNOSIS — H04123 Dry eye syndrome of bilateral lacrimal glands: Secondary | ICD-10-CM | POA: Diagnosis not present

## 2020-07-29 DIAGNOSIS — H02401 Unspecified ptosis of right eyelid: Secondary | ICD-10-CM | POA: Diagnosis not present

## 2020-07-30 DIAGNOSIS — R41841 Cognitive communication deficit: Secondary | ICD-10-CM | POA: Diagnosis not present

## 2020-07-30 DIAGNOSIS — R488 Other symbolic dysfunctions: Secondary | ICD-10-CM | POA: Diagnosis not present

## 2020-07-31 DIAGNOSIS — M542 Cervicalgia: Secondary | ICD-10-CM | POA: Diagnosis not present

## 2020-07-31 DIAGNOSIS — M6281 Muscle weakness (generalized): Secondary | ICD-10-CM | POA: Diagnosis not present

## 2020-07-31 DIAGNOSIS — M5442 Lumbago with sciatica, left side: Secondary | ICD-10-CM | POA: Diagnosis not present

## 2020-08-05 DIAGNOSIS — M6281 Muscle weakness (generalized): Secondary | ICD-10-CM | POA: Diagnosis not present

## 2020-08-05 DIAGNOSIS — M542 Cervicalgia: Secondary | ICD-10-CM | POA: Diagnosis not present

## 2020-08-05 DIAGNOSIS — M5442 Lumbago with sciatica, left side: Secondary | ICD-10-CM | POA: Diagnosis not present

## 2020-08-06 DIAGNOSIS — R41841 Cognitive communication deficit: Secondary | ICD-10-CM | POA: Diagnosis not present

## 2020-08-06 DIAGNOSIS — R488 Other symbolic dysfunctions: Secondary | ICD-10-CM | POA: Diagnosis not present

## 2020-08-07 ENCOUNTER — Other Ambulatory Visit: Payer: Self-pay

## 2020-08-07 ENCOUNTER — Ambulatory Visit (INDEPENDENT_AMBULATORY_CARE_PROVIDER_SITE_OTHER): Payer: Medicare HMO | Admitting: Internal Medicine

## 2020-08-07 VITALS — BP 150/90 | HR 91 | Temp 97.7°F | Ht 64.0 in | Wt 159.0 lb

## 2020-08-07 DIAGNOSIS — R2689 Other abnormalities of gait and mobility: Secondary | ICD-10-CM

## 2020-08-07 DIAGNOSIS — R300 Dysuria: Secondary | ICD-10-CM | POA: Diagnosis not present

## 2020-08-07 DIAGNOSIS — N3000 Acute cystitis without hematuria: Secondary | ICD-10-CM | POA: Diagnosis not present

## 2020-08-07 DIAGNOSIS — G3184 Mild cognitive impairment, so stated: Secondary | ICD-10-CM | POA: Diagnosis not present

## 2020-08-07 LAB — CBC WITH DIFFERENTIAL/PLATELET
Basophils Absolute: 0 10*3/uL (ref 0.0–0.1)
Basophils Relative: 0.7 % (ref 0.0–3.0)
Eosinophils Absolute: 0.1 10*3/uL (ref 0.0–0.7)
Eosinophils Relative: 2.1 % (ref 0.0–5.0)
HCT: 38.3 % (ref 36.0–46.0)
Hemoglobin: 12.8 g/dL (ref 12.0–15.0)
Lymphocytes Relative: 13.7 % (ref 12.0–46.0)
Lymphs Abs: 0.9 10*3/uL (ref 0.7–4.0)
MCHC: 33.4 g/dL (ref 30.0–36.0)
MCV: 92.4 fl (ref 78.0–100.0)
Monocytes Absolute: 0.6 10*3/uL (ref 0.1–1.0)
Monocytes Relative: 9.1 % (ref 3.0–12.0)
Neutro Abs: 4.7 10*3/uL (ref 1.4–7.7)
Neutrophils Relative %: 74.4 % (ref 43.0–77.0)
Platelets: 306 10*3/uL (ref 150.0–400.0)
RBC: 4.15 Mil/uL (ref 3.87–5.11)
RDW: 14.2 % (ref 11.5–15.5)
WBC: 6.3 10*3/uL (ref 4.0–10.5)

## 2020-08-07 LAB — URINALYSIS, ROUTINE W REFLEX MICROSCOPIC
Bilirubin Urine: NEGATIVE
Hgb urine dipstick: NEGATIVE
Ketones, ur: NEGATIVE
Nitrite: NEGATIVE
RBC / HPF: NONE SEEN (ref 0–?)
Specific Gravity, Urine: 1.01 (ref 1.000–1.030)
Total Protein, Urine: NEGATIVE
Urine Glucose: NEGATIVE
Urobilinogen, UA: 0.2 (ref 0.0–1.0)
pH: 7 (ref 5.0–8.0)

## 2020-08-07 LAB — BASIC METABOLIC PANEL
BUN: 14 mg/dL (ref 6–23)
CO2: 27 mEq/L (ref 19–32)
Calcium: 10 mg/dL (ref 8.4–10.5)
Chloride: 103 mEq/L (ref 96–112)
Creatinine, Ser: 0.87 mg/dL (ref 0.40–1.20)
GFR: 59.45 mL/min — ABNORMAL LOW (ref >60.00)
Glucose, Bld: 105 mg/dL — ABNORMAL HIGH (ref 70–99)
Potassium: 4.2 mEq/L (ref 3.5–5.1)
Sodium: 139 mEq/L (ref 135–145)

## 2020-08-07 LAB — POCT URINALYSIS DIPSTICK
Bilirubin, UA: NEGATIVE
Blood, UA: NEGATIVE
Glucose, UA: NEGATIVE
Ketones, UA: NEGATIVE
Nitrite, UA: NEGATIVE
Protein, UA: NEGATIVE
Spec Grav, UA: 1.015 (ref 1.010–1.025)
Urobilinogen, UA: 0.2 E.U./dL
pH, UA: 7 (ref 5.0–8.0)

## 2020-08-07 LAB — VITAMIN B12: Vitamin B-12: 165 pg/mL — ABNORMAL LOW (ref 211–911)

## 2020-08-07 MED ORDER — SULFAMETHOXAZOLE-TRIMETHOPRIM 800-160 MG PO TABS: 1.0000 | ORAL_TABLET | Freq: Two times a day (BID) | ORAL | 0 refills | Status: AC

## 2020-08-07 NOTE — Progress Notes (Signed)
Acute office Visit     This visit occurred during the SARS-CoV-2 public health emergency.  Safety protocols were in place, including screening questions prior to the visit, additional usage of staff PPE, and extensive cleaning of exam room while observing appropriate contact time as indicated for disinfecting solutions.    CC/Reason for Visit: Increased confusion  HPI: [name] is a 87 y.o. female who is coming in today for the above mentioned reasons.  She is here today at the urging of her physical therapist due to some increased confusion and balance issues that they have noticed.  She has recently moved into [place].  I will insert below a picture of the letter that her physical therapist has sent in with her today.  She has been experiencing what she describes as dysuria for the last week, no fever, no abdominal pain.  She tells me that she has had significant increase in back and hip pain issues as she feels very "wobbly on her feet".      Past Medical/Surgical History: Past Medical History:  Diagnosis Date  . Anxiety and depression   . Back pain    lumbar  . Fecal incontinence    Dr. Michail Sermon, likely due to loose stools in conjunction with pelvic muscle laxity from aging, recommended to stop Miralax  . GI (gastrointestinal bleed)    hx of colon polyps 2004, diverticulosis, internal hemmorhids  . Headache   . Headache(784.0)   . Hydronephrosis    Right 2008 s/p urology eval CT/cysto: observation/monitor  . Hyperlipidemia   . Hypertension   . Knee pain    right chronic  . Migraine    on topamax , per neuro  . Osteoporosis   . Pulmonary nodules    [place]. First seen by CT only 05/04/03, no change on f/u 08/08/06 -Macroscopic changes rml 04/27/07 > resolved april 6,2010 no further w/u   . Seborrheic keratosis    Left inferior posterior flank  . SVT (supraventricular tachycardia) (HCC)    sees Dr.Ross    Past Surgical History:  Procedure  Laterality Date  . ABDOMINAL HYSTERECTOMY    . APPENDECTOMY    . BASAL CELL CARCINOMA EXCISION    . BLADDER REPAIR    . CATARACT EXTRACTION  2008   Dr.Digby  . CHOLECYSTECTOMY    . lumbar reconstructive surgery  01/07/2009  . R ovary removed    . ROTATOR CUFF REPAIR  9/-2011  . SHOULDER SURGERY  12-2011   LEFT  . TONSILLECTOMY    . TOTAL KNEE ARTHROPLASTY     right  . trigger finger surgery     L thumb (2014), R ring finger 05-2013    Social History:  reports that she has never smoked. She has never used smokeless tobacco. She reports current alcohol use of about 1.0 standard drink of alcohol per week. She reports that she does not use drugs.  Allergies: Allergies  Allergen Reactions  . Amitriptyline Hcl     REACTION: hallucinations  . Carisoprodol     REACTION: nightmares,rash,tachycardia  . Codeine     REACTION: "deathly ill" nauesa, dizzy,weak  . Fosamax [Alendronate Sodium] Other (See Comments)    Dental issues  . Meloxicam     REACTION: sensitive to sun; facial redness; may cause severe abd pain  . Metronidazole     REACTION: abd pain  . Nortriptyline Hcl     REACTION: hallucinations  . Oxaprozin     REACTION: abd  pain  . Promethazine Hcl     REACTION: spastic arms and legs flailing while awake and unusual behavior while sleep - walking all over house during night  . Propranolol Other (See Comments)    hallucinations  . Rofecoxib     REACTION: rash  . Sumatriptan     REACTION: abdominal pain, blurry vision, drowsiness  . Tramadol Other (See Comments)    Dizziness and constipation  . Venlafaxine     Hallucinations     Family History:  Family History  Problem Relation Age of Onset  . Breast cancer Mother        later in life  . Diabetes Mother        late in life  . Heart disease Mother        late onset  . Lung cancer Father        apparently had TB  . Heart disease Father        late onset  . Prostate cancer Father   . Colitis Brother   .  Cancer Brother   . Colon cancer Neg Hx      Current Outpatient Medications:  .  acetaminophen (TYLENOL) 325 MG tablet, Take 650 mg by mouth every 6 (six) hours as needed., Disp: , Rfl:  .  amLODipine (NORVASC) 5 MG tablet, Take 1 tablet (5 mg total) by mouth daily. NEEDS APPOINTMENT FOR FUTURE REFILL / 1st Attempt, Disp: 90 tablet, Rfl: 0 .  amoxicillin-clavulanate (AUGMENTIN) 875-125 MG tablet, Take 1 tablet by mouth once. Prior to Dentist appts., Disp: , Rfl:  .  atorvastatin (LIPITOR) 40 MG tablet, TAKE 1 TABLET BY MOUTH EVERYDAY AT BEDTIME, Disp: 90 tablet, Rfl: 1 .  Bismuth Subsalicylate (PEPTO-BISMOL PO), Take 1 tablet by mouth daily as needed. , Disp: , Rfl:  .  Ca [id number] (CALCIUM-FOLIC ACID PLUS D PO), Take by mouth daily., Disp: , Rfl:  .  celecoxib (CELEBREX) 200 MG capsule, TAKE 1 CAPSULE BY MOUTH EVERY DAY AS NEEDED FOR MODERATE PAIN, Disp: 90 capsule, Rfl: 1 .  CRANBERRY PO, Take 1 capsule by mouth daily., Disp: , Rfl:  .  cyclobenzaprine (FLEXERIL) 10 MG tablet, TAKE 1 TABLET BY MOUTH THREE TIMES A DAY AS NEEDED FOR MUSCLE SPASMS, Disp: 30 tablet, Rfl: 2 .  dextromethorphan-guaiFENesin (MUCINEX DM) 30-600 MG 12hr tablet, Take 1 tablet by mouth 2 (two) times daily as needed for cough., Disp: , Rfl:  .  escitalopram (LEXAPRO) 10 MG tablet, TAKE 1 TABLET BY MOUTH EVERY DAY, Disp: 90 tablet, Rfl: 1 .  famotidine (PEPCID) 20 MG tablet, Take 1 tablet (20 mg total) by mouth daily., Disp: 30 tablet, Rfl: 6 .  gabapentin (NEURONTIN) 100 MG capsule, TAKE 2-3 CAPSULES (200-300 MG TOTAL) BY MOUTH 3 (THREE) TIMES DAILY., Disp: 810 capsule, Rfl: 1 .  Liniments (BLUE-EMU SUPER STRENGTH) CREA, Apply 1 application topically at bedtime as needed (dry skin)., Disp: , Rfl:  .  loperamide (IMODIUM A-D) 2 MG tablet, Take 2 mg by mouth 4 (four) times daily as needed for diarrhea or loose stools., Disp: , Rfl:  .  loratadine (CLARITIN) 10 MG tablet, TAKE 1 TABLET BY MOUTH EVERY DAY,  Disp: 90 tablet, Rfl: 1 .  Menthol, Topical Analgesic, (BLUE-EMU MAXIMUM STRENGTH EX), Apply topically. Apply topically as needed., Disp: , Rfl:  .  Multiple Vitamins-Minerals (CENTRUM SILVER 50+WOMEN) TABS, take 1 tablet by oral route every day, Disp: , Rfl:  .  NON FORMULARY, Take 2 capsules by mouth 2 (  two) times daily. [employer]	[place], Disp: , Rfl:  .  olopatadine (PATANOL) 0.1 % ophthalmic solution, Place 1 drop into both eyes 2 (two) times daily. As directed, Disp: , Rfl:  .  pantoprazole (PROTONIX) 40 MG tablet, TAKE 1 TABLET (40 MG TOTAL) BY MOUTH DAILY. TAKE 30-60 MIN BEFORE FIRST MEAL OF THE DAY, Disp: 90 tablet, Rfl: 3 .  Phenylephrine-Acetaminophen (SINUS PRESSURE + PAIN) 5-325 MG TABS, Take by mouth as directed., Disp: , Rfl:  .  Potassium Chloride ER 20 MEQ TBCR, TAKE 1 TABLET BY MOUTH EVERY DAY, Disp: 90 tablet, Rfl: 0 .  Probiotic Product (PROBIOTIC DAILY PO), Take 1 capsule by mouth daily. , Disp: , Rfl:  .  simethicone (MYLICON) 80 MG chewable tablet, Chew 80 mg by mouth every 6 (six) hours as needed for flatulence., Disp: , Rfl:  .  sulfamethoxazole-trimethoprim (BACTRIM DS) 800-160 MG tablet, Take 1 tablet by mouth 2 (two) times daily for 7 days., Disp: 14 tablet, Rfl: 0 .  trimethoprim (TRIMPEX) 100 MG tablet, Take 100 mg by mouth daily., Disp: , Rfl:  .  meloxicam (MOBIC) 7.5 MG tablet, Take one tab daily as neede (Patient not taking: No sig reported), Disp: 30 tablet, Rfl: 2  Review of Systems:  Constitutional: Denies fever, chills, diaphoresis, appetite change and fatigue.  HEENT: Denies photophobia, eye pain, redness, hearing loss, ear pain, congestion, sore throat, rhinorrhea, sneezing, mouth sores, trouble swallowing, neck pain, neck stiffness and tinnitus.   Respiratory: Denies SOB, DOE, cough, chest tightness,  and wheezing.   Cardiovascular: Denies chest pain, palpitations and leg swelling.  Gastrointestinal: Denies nausea, vomiting, abdominal pain, diarrhea, constipation,  blood in stool and abdominal distention.  Genitourinary: Denies urgency, frequency, hematuria, flank pain and difficulty urinating.  Endocrine: Denies: hot or cold intolerance, sweats, changes in hair or nails, polyuria, polydipsia. Musculoskeletal: Denies myalgias, back pain, joint swelling, arthralgias and gait problem.  Skin: Denies pallor, rash and wound.  Neurological: Denies dizziness, seizures, syncope, numbness and headaches.  Hematological: Denies adenopathy. Easy bruising, personal or family bleeding history  Psychiatric/Behavioral: Denies suicidal ideation, mood changes, confusion, nervousness, sleep disturbance and agitation    Physical Exam: Vitals:   08/07/20 1059  BP: (!) 150/90  Pulse: 91  Temp: 97.7 F (36.5 C)  TempSrc: Oral  SpO2: 97%  Weight: 159 lb (72.1 kg)  Height: 5\' 4"  (1.626 m)    Body mass index is 27.29 kg/m.   Constitutional: NAD, calm, comfortable, ambulates with a cane Eyes: PERRL, lids and conjunctivae normal ENMT: Mucous membranes are moist. Respiratory: clear to auscultation bilaterally, no wheezing, no crackles. Normal respiratory effort. No accessory muscle use.  Cardiovascular: Regular rate and rhythm, no murmurs / rubs / gallops. No extremity edema. Psychiatric: Normal judgment and insight. Alert and oriented x 3. Normal mood.    Impression and Plan:  Acute cystitis without hematuria  -Urine dipstick with 3+ leukocytes. -Sent for UA and culture. -I will treat with Bactrim DS 1 tablet twice daily for 7 days.  Balance disorder  - Plan: Basic metabolic panel, Vitamin W46 -Could also be affected by her UTI.  Mild cognitive impairment -Followed by neurology.   Patient Instructions   -Nice seeing you today!!  -Lab work today; will notify you once results are available.  -Start Bactrim DS 1 tablet twice daily for 7 days.  -Make sure you drink plenty of fluids.              Urinary Tract Infection, Adult A urinary tract  infection (UTI) is an infection of any part of the urinary tract. The urinary tract includes:  The kidneys.  The ureters.  The bladder.  The urethra. These organs make, store, and get rid of pee (urine) in the body. What are the causes? This infection is caused by germs (bacteria) in your genital area. These germs grow and cause swelling (inflammation) of your urinary tract. What increases the risk? The following factors may make you more likely to develop this condition:  Using a small, thin tube (catheter) to drain pee.  Not being able to control when you pee or poop (incontinence).  Being female. If you are female, these things can increase the risk: ? Using these methods to prevent pregnancy:  A medicine that kills sperm (spermicide).  A device that blocks sperm (diaphragm). ? Having low levels of a female hormone (estrogen). ? Being pregnant. You are more likely to develop this condition if:  You have genes that add to your risk.  You are sexually active.  You take antibiotic medicines.  You have trouble peeing because of: ? A prostate that is bigger than normal, if you are female. ? A blockage in the part of your body that drains pee from the bladder. ? A kidney stone. ? A nerve condition that affects your bladder. ? Not getting enough to drink. ? Not peeing often enough.  You have other conditions, such as: ? Diabetes. ? A weak disease-fighting system (immune system). ? Sickle cell disease. ? Gout. ? Injury of the spine. What are the signs or symptoms? Symptoms of this condition include:  Needing to pee right away.  Peeing small amounts often.  Pain or burning when peeing.  Blood in the pee.  Pee that smells bad or not like normal.  Trouble peeing.  Pee that is cloudy.  Fluid coming from the vagina, if you are female.  Pain in the belly or lower back. Other symptoms include:  Vomiting.  Not feeling hungry.  Feeling mixed up (confused).  This may be the first symptom in older adults.  Being tired and grouchy (irritable).  A fever.  Watery poop (diarrhea). How is this treated?  Taking antibiotic medicine.  Taking other medicines.  Drinking enough water. In some cases, you may need to see a specialist. Follow these instructions at home: Medicines  Take over-the-counter and prescription medicines only as told by your doctor.  If you were prescribed an antibiotic medicine, take it as told by your doctor. Do not stop taking it even if you start to feel better. General instructions  Make sure you: ? Pee until your bladder is empty. ? Do not hold pee for a long time. ? Empty your bladder after sex. ? Wipe from front to back after peeing or pooping if you are a female. Use each tissue one time when you wipe.  Drink enough fluid to keep your pee pale yellow.  Keep all follow-up visits.   Contact a doctor if:  You do not get better after 1-2 days.  Your symptoms go away and then come back. Get help right away if:  You have very bad back pain.  You have very bad pain in your lower belly.  You have a fever.  You have chills.  You feeling like you will vomit or you vomit. Summary  A urinary tract infection (UTI) is an infection of any part of the urinary tract.  This condition is caused by germs in your genital area.  There  are many risk factors for a UTI.  Treatment includes antibiotic medicines.  Drink enough fluid to keep your pee pale yellow. This information is not intended to replace advice given to you by your health care provider. Make sure you discuss any questions you have with your health care provider. Document Revised: 11/16/2019 Document Reviewed: 11/16/2019 Elsevier Patient Education  2021 [place], MD [facility]

## 2020-08-07 NOTE — Patient Instructions (Signed)
-Nice seeing you today!!  -Lab work today; will notify you once results are available.  -Start Bactrim DS 1 tablet twice daily for 7 days.  -Make sure you drink plenty of fluids.              Urinary Tract Infection, Adult A urinary tract infection (UTI) is an infection of any part of the urinary tract. The urinary tract includes:  The kidneys.  The ureters.  The bladder.  The urethra. These organs make, store, and get rid of pee (urine) in the body. What are the causes? This infection is caused by germs (bacteria) in your genital area. These germs grow and cause swelling (inflammation) of your urinary tract. What increases the risk? The following factors may make you more likely to develop this condition:  Using a small, thin tube (catheter) to drain pee.  Not being able to control when you pee or poop (incontinence).  Being female. If you are female, these things can increase the risk: ? Using these methods to prevent pregnancy:  A medicine that kills sperm (spermicide).  A device that blocks sperm (diaphragm). ? Having low levels of a female hormone (estrogen). ? Being pregnant. You are more likely to develop this condition if:  You have genes that add to your risk.  You are sexually active.  You take antibiotic medicines.  You have trouble peeing because of: ? A prostate that is bigger than normal, if you are female. ? A blockage in the part of your body that drains pee from the bladder. ? A kidney stone. ? A nerve condition that affects your bladder. ? Not getting enough to drink. ? Not peeing often enough.  You have other conditions, such as: ? Diabetes. ? A weak disease-fighting system (immune system). ? Sickle cell disease. ? Gout. ? Injury of the spine. What are the signs or symptoms? Symptoms of this condition include:  Needing to pee right away.  Peeing small amounts often.  Pain or burning when peeing.  Blood in the pee.  Pee that smells  bad or not like normal.  Trouble peeing.  Pee that is cloudy.  Fluid coming from the vagina, if you are female.  Pain in the belly or lower back. Other symptoms include:  Vomiting.  Not feeling hungry.  Feeling mixed up (confused). This may be the first symptom in older adults.  Being tired and grouchy (irritable).  A fever.  Watery poop (diarrhea). How is this treated?  Taking antibiotic medicine.  Taking other medicines.  Drinking enough water. In some cases, you may need to see a specialist. Follow these instructions at home: Medicines  Take over-the-counter and prescription medicines only as told by your doctor.  If you were prescribed an antibiotic medicine, take it as told by your doctor. Do not stop taking it even if you start to feel better. General instructions  Make sure you: ? Pee until your bladder is empty. ? Do not hold pee for a long time. ? Empty your bladder after sex. ? Wipe from front to back after peeing or pooping if you are a female. Use each tissue one time when you wipe.  Drink enough fluid to keep your pee pale yellow.  Keep all follow-up visits.   Contact a doctor if:  You do not get better after 1-2 days.  Your symptoms go away and then come back. Get help right away if:  You have very bad back pain.  You have very bad  pain in your lower belly.  You have a fever.  You have chills.  You feeling like you will vomit or you vomit. Summary  A urinary tract infection (UTI) is an infection of any part of the urinary tract.  This condition is caused by germs in your genital area.  There are many risk factors for a UTI.  Treatment includes antibiotic medicines.  Drink enough fluid to keep your pee pale yellow. This information is not intended to replace advice given to you by your health care provider. Make sure you discuss any questions you have with your health care provider. Document Revised: 11/16/2019 Document Reviewed:  11/16/2019 Elsevier Patient Education  [place].

## 2020-08-08 ENCOUNTER — Telehealth: Payer: Self-pay | Admitting: Internal Medicine

## 2020-08-08 DIAGNOSIS — M542 Cervicalgia: Secondary | ICD-10-CM | POA: Diagnosis not present

## 2020-08-08 DIAGNOSIS — M6281 Muscle weakness (generalized): Secondary | ICD-10-CM | POA: Diagnosis not present

## 2020-08-08 DIAGNOSIS — R41841 Cognitive communication deficit: Secondary | ICD-10-CM | POA: Diagnosis not present

## 2020-08-08 DIAGNOSIS — M5442 Lumbago with sciatica, left side: Secondary | ICD-10-CM | POA: Diagnosis not present

## 2020-08-08 DIAGNOSIS — R488 Other symbolic dysfunctions: Secondary | ICD-10-CM | POA: Diagnosis not present

## 2020-08-08 LAB — URINE CULTURE
MICRO NUMBER:: 11797758
SPECIMEN QUALITY:: ADEQUATE

## 2020-08-08 NOTE — Telephone Encounter (Signed)
Pt needs to r/s appt on 09/18/20--provider out of office.  Ok'd by Jerilee Hoh to work pt in.

## 2020-08-11 DIAGNOSIS — M5442 Lumbago with sciatica, left side: Secondary | ICD-10-CM | POA: Diagnosis not present

## 2020-08-11 DIAGNOSIS — M542 Cervicalgia: Secondary | ICD-10-CM | POA: Diagnosis not present

## 2020-08-11 DIAGNOSIS — M6281 Muscle weakness (generalized): Secondary | ICD-10-CM | POA: Diagnosis not present

## 2020-08-13 DIAGNOSIS — R488 Other symbolic dysfunctions: Secondary | ICD-10-CM | POA: Diagnosis not present

## 2020-08-13 DIAGNOSIS — R41841 Cognitive communication deficit: Secondary | ICD-10-CM | POA: Diagnosis not present

## 2020-08-15 DIAGNOSIS — M6281 Muscle weakness (generalized): Secondary | ICD-10-CM | POA: Diagnosis not present

## 2020-08-15 DIAGNOSIS — R41841 Cognitive communication deficit: Secondary | ICD-10-CM | POA: Diagnosis not present

## 2020-08-15 DIAGNOSIS — M542 Cervicalgia: Secondary | ICD-10-CM | POA: Diagnosis not present

## 2020-08-15 DIAGNOSIS — R488 Other symbolic dysfunctions: Secondary | ICD-10-CM | POA: Diagnosis not present

## 2020-08-15 DIAGNOSIS — M5442 Lumbago with sciatica, left side: Secondary | ICD-10-CM | POA: Diagnosis not present

## 2020-08-18 DIAGNOSIS — M6281 Muscle weakness (generalized): Secondary | ICD-10-CM | POA: Diagnosis not present

## 2020-08-18 DIAGNOSIS — M5442 Lumbago with sciatica, left side: Secondary | ICD-10-CM | POA: Diagnosis not present

## 2020-08-18 DIAGNOSIS — R41841 Cognitive communication deficit: Secondary | ICD-10-CM | POA: Diagnosis not present

## 2020-08-18 DIAGNOSIS — M542 Cervicalgia: Secondary | ICD-10-CM | POA: Diagnosis not present

## 2020-08-18 DIAGNOSIS — R488 Other symbolic dysfunctions: Secondary | ICD-10-CM | POA: Diagnosis not present

## 2020-08-20 ENCOUNTER — Telehealth: Payer: Self-pay | Admitting: Internal Medicine

## 2020-08-20 DIAGNOSIS — M542 Cervicalgia: Secondary | ICD-10-CM | POA: Diagnosis not present

## 2020-08-20 DIAGNOSIS — M6281 Muscle weakness (generalized): Secondary | ICD-10-CM | POA: Diagnosis not present

## 2020-08-20 DIAGNOSIS — R488 Other symbolic dysfunctions: Secondary | ICD-10-CM | POA: Diagnosis not present

## 2020-08-20 DIAGNOSIS — M5442 Lumbago with sciatica, left side: Secondary | ICD-10-CM | POA: Diagnosis not present

## 2020-08-20 NOTE — Telephone Encounter (Signed)
Alan Mulder call from [facility] and stated she need to talk to the nurse about something that is going on with pt.Robin's # is [phone]

## 2020-08-20 NOTE — Telephone Encounter (Signed)
Spoke with Shirlean Mylar and verbal orders given per Dr Jerilee Hoh and she is also requesting a copy of the patient's medication list faxed with written orders.

## 2020-08-21 DIAGNOSIS — R488 Other symbolic dysfunctions: Secondary | ICD-10-CM | POA: Diagnosis not present

## 2020-08-22 DIAGNOSIS — R488 Other symbolic dysfunctions: Secondary | ICD-10-CM | POA: Diagnosis not present

## 2020-08-22 NOTE — Telephone Encounter (Signed)
Form and medication list faxed and confirmed

## 2020-08-25 DIAGNOSIS — M6281 Muscle weakness (generalized): Secondary | ICD-10-CM | POA: Diagnosis not present

## 2020-08-25 DIAGNOSIS — M5442 Lumbago with sciatica, left side: Secondary | ICD-10-CM | POA: Diagnosis not present

## 2020-08-25 DIAGNOSIS — M542 Cervicalgia: Secondary | ICD-10-CM | POA: Diagnosis not present

## 2020-08-26 DIAGNOSIS — M5442 Lumbago with sciatica, left side: Secondary | ICD-10-CM | POA: Diagnosis not present

## 2020-08-26 DIAGNOSIS — M6281 Muscle weakness (generalized): Secondary | ICD-10-CM | POA: Diagnosis not present

## 2020-08-26 DIAGNOSIS — M542 Cervicalgia: Secondary | ICD-10-CM | POA: Diagnosis not present

## 2020-08-27 DIAGNOSIS — R488 Other symbolic dysfunctions: Secondary | ICD-10-CM | POA: Diagnosis not present

## 2020-08-28 DIAGNOSIS — R41841 Cognitive communication deficit: Secondary | ICD-10-CM | POA: Diagnosis not present

## 2020-08-28 DIAGNOSIS — R488 Other symbolic dysfunctions: Secondary | ICD-10-CM | POA: Diagnosis not present

## 2020-08-29 DIAGNOSIS — R488 Other symbolic dysfunctions: Secondary | ICD-10-CM | POA: Diagnosis not present

## 2020-09-01 DIAGNOSIS — R488 Other symbolic dysfunctions: Secondary | ICD-10-CM | POA: Diagnosis not present

## 2020-09-02 DIAGNOSIS — R488 Other symbolic dysfunctions: Secondary | ICD-10-CM | POA: Diagnosis not present

## 2020-09-03 DIAGNOSIS — M6281 Muscle weakness (generalized): Secondary | ICD-10-CM | POA: Diagnosis not present

## 2020-09-03 DIAGNOSIS — M5442 Lumbago with sciatica, left side: Secondary | ICD-10-CM | POA: Diagnosis not present

## 2020-09-03 DIAGNOSIS — M542 Cervicalgia: Secondary | ICD-10-CM | POA: Diagnosis not present

## 2020-09-03 DIAGNOSIS — R488 Other symbolic dysfunctions: Secondary | ICD-10-CM | POA: Diagnosis not present

## 2020-09-04 DIAGNOSIS — R488 Other symbolic dysfunctions: Secondary | ICD-10-CM | POA: Diagnosis not present

## 2020-09-04 DIAGNOSIS — R41841 Cognitive communication deficit: Secondary | ICD-10-CM | POA: Diagnosis not present

## 2020-09-05 DIAGNOSIS — R488 Other symbolic dysfunctions: Secondary | ICD-10-CM | POA: Diagnosis not present

## 2020-09-08 DIAGNOSIS — R488 Other symbolic dysfunctions: Secondary | ICD-10-CM | POA: Diagnosis not present

## 2020-09-08 DIAGNOSIS — R41841 Cognitive communication deficit: Secondary | ICD-10-CM | POA: Diagnosis not present

## 2020-09-09 DIAGNOSIS — M25473 Effusion, unspecified ankle: Secondary | ICD-10-CM | POA: Diagnosis not present

## 2020-09-09 DIAGNOSIS — M25571 Pain in right ankle and joints of right foot: Secondary | ICD-10-CM | POA: Diagnosis not present

## 2020-09-09 DIAGNOSIS — M25572 Pain in left ankle and joints of left foot: Secondary | ICD-10-CM | POA: Diagnosis not present

## 2020-09-10 DIAGNOSIS — R488 Other symbolic dysfunctions: Secondary | ICD-10-CM | POA: Diagnosis not present

## 2020-09-11 DIAGNOSIS — R488 Other symbolic dysfunctions: Secondary | ICD-10-CM | POA: Diagnosis not present

## 2020-09-11 DIAGNOSIS — R41841 Cognitive communication deficit: Secondary | ICD-10-CM | POA: Diagnosis not present

## 2020-09-12 DIAGNOSIS — R488 Other symbolic dysfunctions: Secondary | ICD-10-CM | POA: Diagnosis not present

## 2020-09-16 ENCOUNTER — Ambulatory Visit: Payer: Medicare Other | Admitting: Nurse Practitioner

## 2020-09-17 ENCOUNTER — Telehealth: Payer: Self-pay | Admitting: Cardiology

## 2020-09-17 DIAGNOSIS — R488 Other symbolic dysfunctions: Secondary | ICD-10-CM | POA: Diagnosis not present

## 2020-09-17 NOTE — Telephone Encounter (Signed)
Pt c/o swelling: STAT is pt has developed SOB within 24 hours  1. If swelling, where is the swelling located? Left ankle  2. How much weight have you gained and in what time span? Maybe 5 lbs since January 1st   3. Have you gained 3 pounds in a day or 5 pounds in a week? no  4. Do you have a log of your daily weights (if so, list)? no  5. Are you currently taking a fluid pill? no  6. Are you currently SOB? no  7. Have you traveled recently? No    Patient states she has had swelling in her left ankle for months and that it is not orthopedic. She states she is not having any other symptoms.

## 2020-09-17 NOTE — Telephone Encounter (Signed)
Attempted to reach pt to discuss her swelling concerns.  NA and no VM.  Appears pt had been taking Furosemide 20 mg daily until 02/26/2020 when it was d/ced in "error" according to documentation.  Will continue to attempt to contact.

## 2020-09-18 ENCOUNTER — Encounter: Payer: Medicare HMO | Admitting: Internal Medicine

## 2020-09-18 DIAGNOSIS — R488 Other symbolic dysfunctions: Secondary | ICD-10-CM | POA: Diagnosis not present

## 2020-09-22 DIAGNOSIS — R41841 Cognitive communication deficit: Secondary | ICD-10-CM | POA: Diagnosis not present

## 2020-09-22 DIAGNOSIS — R488 Other symbolic dysfunctions: Secondary | ICD-10-CM | POA: Diagnosis not present

## 2020-09-22 NOTE — Telephone Encounter (Signed)
Spoke with pt who reports left ankle edema is better-not completely gone but better.  She is limiting her NA+ and keeping her leg/foot elevated during the day above the level of her heart.  She will continue to do so and call back if any further questions/concerns.

## 2020-09-23 DIAGNOSIS — R488 Other symbolic dysfunctions: Secondary | ICD-10-CM | POA: Diagnosis not present

## 2020-09-25 DIAGNOSIS — R488 Other symbolic dysfunctions: Secondary | ICD-10-CM | POA: Diagnosis not present

## 2020-09-25 DIAGNOSIS — R41841 Cognitive communication deficit: Secondary | ICD-10-CM | POA: Diagnosis not present

## 2020-09-26 ENCOUNTER — Other Ambulatory Visit: Payer: Self-pay | Admitting: Internal Medicine

## 2020-10-01 DIAGNOSIS — R488 Other symbolic dysfunctions: Secondary | ICD-10-CM | POA: Diagnosis not present

## 2020-10-02 DIAGNOSIS — R488 Other symbolic dysfunctions: Secondary | ICD-10-CM | POA: Diagnosis not present

## 2020-10-02 DIAGNOSIS — R41841 Cognitive communication deficit: Secondary | ICD-10-CM | POA: Diagnosis not present

## 2020-10-03 DIAGNOSIS — R488 Other symbolic dysfunctions: Secondary | ICD-10-CM | POA: Diagnosis not present

## 2020-10-06 DIAGNOSIS — R488 Other symbolic dysfunctions: Secondary | ICD-10-CM | POA: Diagnosis not present

## 2020-10-07 DIAGNOSIS — R41841 Cognitive communication deficit: Secondary | ICD-10-CM | POA: Diagnosis not present

## 2020-10-07 DIAGNOSIS — R488 Other symbolic dysfunctions: Secondary | ICD-10-CM | POA: Diagnosis not present

## 2020-10-08 DIAGNOSIS — R488 Other symbolic dysfunctions: Secondary | ICD-10-CM | POA: Diagnosis not present

## 2020-10-09 DIAGNOSIS — R600 Localized edema: Secondary | ICD-10-CM | POA: Diagnosis not present

## 2020-10-09 DIAGNOSIS — I700 Atherosclerosis of aorta: Secondary | ICD-10-CM | POA: Diagnosis not present

## 2020-10-09 DIAGNOSIS — I471 Supraventricular tachycardia: Secondary | ICD-10-CM | POA: Diagnosis not present

## 2020-10-09 DIAGNOSIS — R488 Other symbolic dysfunctions: Secondary | ICD-10-CM | POA: Diagnosis not present

## 2020-10-09 DIAGNOSIS — G43909 Migraine, unspecified, not intractable, without status migrainosus: Secondary | ICD-10-CM | POA: Diagnosis not present

## 2020-10-09 DIAGNOSIS — R41841 Cognitive communication deficit: Secondary | ICD-10-CM | POA: Diagnosis not present

## 2020-10-09 DIAGNOSIS — K219 Gastro-esophageal reflux disease without esophagitis: Secondary | ICD-10-CM | POA: Diagnosis not present

## 2020-10-09 DIAGNOSIS — G3184 Mild cognitive impairment, so stated: Secondary | ICD-10-CM | POA: Diagnosis not present

## 2020-10-09 DIAGNOSIS — N1831 Chronic kidney disease, stage 3a: Secondary | ICD-10-CM | POA: Diagnosis not present

## 2020-10-09 DIAGNOSIS — I129 Hypertensive chronic kidney disease with stage 1 through stage 4 chronic kidney disease, or unspecified chronic kidney disease: Secondary | ICD-10-CM | POA: Diagnosis not present

## 2020-10-09 DIAGNOSIS — F331 Major depressive disorder, recurrent, moderate: Secondary | ICD-10-CM | POA: Diagnosis not present

## 2020-10-10 DIAGNOSIS — R488 Other symbolic dysfunctions: Secondary | ICD-10-CM | POA: Diagnosis not present

## 2020-10-10 DIAGNOSIS — R41841 Cognitive communication deficit: Secondary | ICD-10-CM | POA: Diagnosis not present

## 2020-10-13 DIAGNOSIS — R41841 Cognitive communication deficit: Secondary | ICD-10-CM | POA: Diagnosis not present

## 2020-10-13 DIAGNOSIS — R488 Other symbolic dysfunctions: Secondary | ICD-10-CM | POA: Diagnosis not present

## 2020-10-14 DIAGNOSIS — R488 Other symbolic dysfunctions: Secondary | ICD-10-CM | POA: Diagnosis not present

## 2020-10-16 DIAGNOSIS — R488 Other symbolic dysfunctions: Secondary | ICD-10-CM | POA: Diagnosis not present

## 2020-10-16 DIAGNOSIS — R41841 Cognitive communication deficit: Secondary | ICD-10-CM | POA: Diagnosis not present

## 2020-10-22 ENCOUNTER — Other Ambulatory Visit: Payer: Self-pay

## 2020-10-22 ENCOUNTER — Emergency Department (HOSPITAL_COMMUNITY)
Admission: EM | Admit: 2020-10-22 | Discharge: 2020-10-22 | Disposition: A | Discharge status: Home / Self Care | Payer: Medicare HMO | Attending: Emergency Medicine | Admitting: Emergency Medicine

## 2020-10-22 ENCOUNTER — Emergency Department (HOSPITAL_COMMUNITY): Payer: Medicare HMO

## 2020-10-22 DIAGNOSIS — W01198A Fall on same level from slipping, tripping and stumbling with subsequent striking against other object, initial encounter: Secondary | ICD-10-CM | POA: Diagnosis not present

## 2020-10-22 DIAGNOSIS — Z96651 Presence of right artificial knee joint: Secondary | ICD-10-CM | POA: Diagnosis not present

## 2020-10-22 DIAGNOSIS — I129 Hypertensive chronic kidney disease with stage 1 through stage 4 chronic kidney disease, or unspecified chronic kidney disease: Secondary | ICD-10-CM | POA: Insufficient documentation

## 2020-10-22 DIAGNOSIS — Y9301 Activity, walking, marching and hiking: Secondary | ICD-10-CM | POA: Insufficient documentation

## 2020-10-22 DIAGNOSIS — S0993XA Unspecified injury of face, initial encounter: Secondary | ICD-10-CM | POA: Diagnosis present

## 2020-10-22 DIAGNOSIS — S0990XA Unspecified injury of head, initial encounter: Secondary | ICD-10-CM | POA: Diagnosis not present

## 2020-10-22 DIAGNOSIS — N182 Chronic kidney disease, stage 2 (mild): Secondary | ICD-10-CM | POA: Insufficient documentation

## 2020-10-22 DIAGNOSIS — Z79899 Other long term (current) drug therapy: Secondary | ICD-10-CM | POA: Diagnosis not present

## 2020-10-22 DIAGNOSIS — M546 Pain in thoracic spine: Secondary | ICD-10-CM | POA: Diagnosis not present

## 2020-10-22 DIAGNOSIS — S0083XA Contusion of other part of head, initial encounter: Secondary | ICD-10-CM | POA: Insufficient documentation

## 2020-10-22 NOTE — ED Provider Notes (Signed)
[facility] DEPT Provider Note   CSN: [id number] Arrival date & time: 10/22/20  1700     History Chief Complaint  Patient presents with   Lytle Michaels    [name] is a 87 y.o. female.  87 year old female with past medical history including SVT, GI bleed, anxiety/depression, hypertension, hyperlipidemia, migraines who presents with fall.  Just prior to arrival, the patient tripped while walking and fell forward, hitting her forehead and face.  She did not lose consciousness and has had no vomiting since the fall.  She reports some pain in her mid thoracic back that is mild, 2/10 in intensity.  She denies any other areas of pain.  No extremity injury.  No vision changes.  No anticoagulant use.  The history is provided by the patient.  Fall      Past Medical History:  Diagnosis Date   Anxiety and depression    Back pain    lumbar   Fecal incontinence    Dr. Michail Sermon, likely due to loose stools in conjunction with pelvic muscle laxity from aging, recommended to stop Miralax   GI (gastrointestinal bleed)    hx of colon polyps 2004, diverticulosis, internal hemmorhids   Headache    Headache(784.0)    Hydronephrosis    Right 2008 s/p urology eval CT/cysto: observation/monitor   Hyperlipidemia    Hypertension    Knee pain    right chronic   Migraine    on topamax , per neuro   Osteoporosis    Pulmonary nodules    [place]. First seen by CT only 05/04/03, no change on f/u 08/08/06 -Macroscopic changes rml 04/27/07 > resolved april 6,2010 no further w/u    Seborrheic keratosis    Left inferior posterior flank   SVT (supraventricular tachycardia) ([place])    sees Dr.Ross    Patient Active Problem List   Diagnosis Date Noted   Urticaria 11/22/2019   Neck pain 11/08/2019   (HFpEF) heart failure with preserved ejection fraction ([place]) 11/23/2018   Abnormal gait 11/03/2018   Edema of both lower extremities 04/18/2018   Chronic migraine 11/15/2017    Memory loss 11/15/2017   Osteoporosis 07/09/2017   CKD (chronic kidney disease) stage 2, GFR 60-89 ml/min 01/06/2016   Abdominal pain 09/10/2015   Pulmonary nodules/lesions, multiple 09/10/2015   Small bowel obstruction, partial ([place]) 09/10/2015   Arthritis 09/10/2015   Partial small bowel obstruction ([place]) 09/10/2015   PCP NOTES >>>>>> 04/17/2015   Hemorrhoids 09/17/2011   Annual physical exam 03/19/2011   DJD -back pain-on Celebrex    Cough 09/25/2010   DOE (dyspnea on exertion) 08/28/2010   Constipation 01/21/2010   DIVERTICULOSIS OF COLON 09/02/2009   PERSONAL HISTORY OF COLONIC POLYPS 09/02/2009   FULL INCONTINENCE OF FECES 09/02/2009   Essential hypertension 08/10/2007   Headache 08/10/2007   Pulmonary nodules 04/25/2007   Anxiety state 02/03/2007   Depression 02/03/2007   KNEE PAIN, RIGHT, CHRONIC 02/03/2007   Hyperlipidemia 04/26/2006   DIZZINESS 04/26/2006   SVT (supraventricular tachycardia) ([place]) 04/26/2006    Past Surgical History:  Procedure Laterality Date   ABDOMINAL HYSTERECTOMY     APPENDECTOMY     BASAL CELL CARCINOMA EXCISION     BLADDER REPAIR     CATARACT EXTRACTION  2008   Dr.Digby   CHOLECYSTECTOMY     lumbar reconstructive surgery  01/07/2009   R ovary removed     ROTATOR CUFF REPAIR  9/-2011   SHOULDER SURGERY  12-2011   LEFT  TONSILLECTOMY     TOTAL KNEE ARTHROPLASTY     right   trigger finger surgery     L thumb (2014), R ring finger 05-2013     OB History   No obstetric history on file.     Family History  Problem Relation Age of Onset   Breast cancer Mother        later in life   Diabetes Mother        late in life   Heart disease Mother        late onset   Lung cancer Father        apparently had TB   Heart disease Father        late onset   Prostate cancer Father    Colitis Brother    Cancer Brother    Colon cancer Neg Hx     Social History   Tobacco Use   Smoking status: Never   Smokeless tobacco: Never   Vaping Use   Vaping Use: Never used  Substance Use Topics   Alcohol use: Yes    Alcohol/week: 1.0 standard drink    Types: 1 Glasses of wine per week    Comment: rarely, 1 glass of wine monthly   Drug use: No    Home Medications Prior to Admission medications   Medication Sig Start Date End Date Taking? Authorizing Provider  acetaminophen (TYLENOL) 325 MG tablet Take 650 mg by mouth every 6 (six) hours as needed.    [provider]  amLODipine (NORVASC) 5 MG tablet Take 1 tablet (5 mg total) by mouth daily. NEEDS APPOINTMENT FOR FUTURE REFILL / 1st Attempt 04/28/20   Richardo Priest, MD  amoxicillin-clavulanate (AUGMENTIN) 875-125 MG tablet Take 1 tablet by mouth once. Prior to Dentist appts.    [provider]  atorvastatin (LIPITOR) 40 MG tablet TAKE 1 TABLET BY MOUTH EVERYDAY AT BEDTIME 02/21/20   Isaac Bliss, Rayford Halsted, MD  Bismuth Subsalicylate (PEPTO-BISMOL PO) Take 1 tablet by mouth daily as needed.     [provider]  Ca [id number] (CALCIUM-FOLIC ACID PLUS D PO) Take by mouth daily.    [provider]  celecoxib (CELEBREX) 200 MG capsule TAKE 1 CAPSULE BY MOUTH EVERY DAY AS NEEDED FOR MODERATE PAIN 03/21/20   Isaac Bliss, Rayford Halsted, MD  CRANBERRY PO Take 1 capsule by mouth daily.    [provider]  cyclobenzaprine (FLEXERIL) 10 MG tablet TAKE 1 TABLET BY MOUTH THREE TIMES A DAY AS NEEDED FOR MUSCLE SPASMS 07/29/20   Isaac Bliss, Rayford Halsted, MD  dextromethorphan-guaiFENesin [facility] DM) 30-600 MG 12hr tablet Take 1 tablet by mouth 2 (two) times daily as needed for cough.    [provider]  escitalopram (LEXAPRO) 10 MG tablet TAKE 1 TABLET BY MOUTH EVERY DAY 07/23/20   Isaac Bliss, Rayford Halsted, MD  famotidine (PEPCID) 20 MG tablet Take 1 tablet (20 mg total) by mouth daily. 06/19/20   Tanda Rockers, MD  gabapentin (NEURONTIN) 100 MG capsule TAKE 2-3 CAPSULES (200-300 MG TOTAL) BY MOUTH 3 (THREE) TIMES  DAILY. 03/05/20   Isaac Bliss, Rayford Halsted, MD  Liniments (BLUE-EMU SUPER STRENGTH) CREA Apply 1 application topically at bedtime as needed (dry skin).    [provider]  loperamide (IMODIUM A-D) 2 MG tablet Take 2 mg by mouth 4 (four) times daily as needed for diarrhea or loose stools.    [provider]  loratadine (CLARITIN) 10 MG tablet TAKE 1 TABLET  BY MOUTH EVERY DAY 02/13/20   Tanda Rockers, MD  meloxicam (MOBIC) 7.5 MG tablet Take one tab daily as neede Patient not taking: No sig reported 03/25/20   Isaac Bliss, Rayford Halsted, MD  Menthol, Topical Analgesic, (BLUE-EMU MAXIMUM STRENGTH EX) Apply topically. Apply topically as needed.    [provider]  Multiple Vitamins-Minerals (CENTRUM SILVER 50+WOMEN) TABS take 1 tablet by oral route every day    [provider]  NON FORMULARY Take 2 capsules by mouth 2 (two) times daily. [facility]    [provider]  olopatadine (PATANOL) 0.1 % ophthalmic solution Place 1 drop into both eyes 2 (two) times daily. As directed 10/05/18   [provider]  pantoprazole (PROTONIX) 40 MG tablet TAKE 1 TABLET (40 MG TOTAL) BY MOUTH DAILY. TAKE 30-60 MIN BEFORE FIRST MEAL OF THE DAY 12/19/19   Tanda Rockers, MD  Phenylephrine-Acetaminophen (SINUS PRESSURE + PAIN) 5-325 MG TABS Take by mouth as directed.    [provider]  Potassium Chloride ER 20 MEQ TBCR TAKE 1 TABLET BY MOUTH EVERY DAY 09/26/20   Isaac Bliss, Rayford Halsted, MD  Probiotic Product (PROBIOTIC DAILY PO) Take 1 capsule by mouth daily.     [provider]  simethicone (MYLICON) 80 MG chewable tablet Chew 80 mg by mouth every 6 (six) hours as needed for flatulence.    [provider]  trimethoprim (TRIMPEX) 100 MG tablet Take 100 mg by mouth daily.    [provider]    Allergies    Amitriptyline hcl, Carisoprodol, Codeine, Fosamax [alendronate sodium], Meloxicam, Metronidazole, Nortriptyline hcl, Oxaprozin,  Promethazine hcl, Propranolol, Rofecoxib, Sumatriptan, Tramadol, and Venlafaxine  Review of Systems   Review of Systems All other systems reviewed and are negative except that which was mentioned in HPI  Physical Exam Updated Vital Signs BP (!) 176/89 (BP Location: Right Arm)   Pulse 75   Temp 98.9 F (37.2 C) (Oral)   Resp 16   SpO2 99%   Physical Exam Constitutional:      General: She is not in acute distress.    Appearance: Normal appearance.  HENT:     Head: Normocephalic and atraumatic.     Comments: No swelling or ecchymoses on face or scalp Eyes:     Extraocular Movements: Extraocular movements intact.     Conjunctiva/sclera: Conjunctivae normal.     Pupils: Pupils are equal, round, and reactive to light.  Neck:     Comments: No midline spinal tenderness Cardiovascular:     Rate and Rhythm: Normal rate and regular rhythm.     Heart sounds: Normal heart sounds. No murmur heard. Pulmonary:     Effort: Pulmonary effort is normal.     Breath sounds: Normal breath sounds.  Abdominal:     General: Abdomen is flat. Bowel sounds are normal. There is no distension.     Palpations: Abdomen is soft.     Tenderness: There is no abdominal tenderness.  Musculoskeletal:     Cervical back: Neck supple.     Right lower leg: No edema.     Left lower leg: No edema.     Comments: Mild paraspinal tenderness of mid-thoracic spine on L  Skin:    General: Skin is warm and dry.  Neurological:     Mental Status: She is alert and oriented to person, place, and time.     Motor: No weakness.     Comments: fluent  Psychiatric:  Mood and Affect: Mood normal.        Behavior: Behavior normal.    ED Results / Procedures / Treatments   Labs (all labs ordered are listed, but only abnormal results are displayed) Labs Reviewed - No data to display  EKG None  Radiology DG Thoracic Spine 2 View  Result Date: 10/22/2020 CLINICAL DATA:  Thoracic back pain after a fall. Trip and  fall injury. EXAM: THORACIC SPINE 2 VIEWS COMPARISON:  07/04/2015 FINDINGS: The thoracic spine is tilted towards the left. This is likely positional but could indicate mild scoliosis, new since the prior study. There is diffuse bone demineralization. Degenerative changes throughout the thoracic spine with narrowed interspaces and endplate osteophyte formation. No anterior subluxation. No vertebral compression deformities. No paraspinal soft tissue mass or infiltration suggested. Postoperative changes seen in the lumbar spine. IMPRESSION: Diffuse bone demineralization and degenerative changes. No acute displaced fractures identified. Electronically Signed   By: Lucienne Capers M.D.   On: 10/22/2020 19:28   DG Ankle Complete Left  Result Date: 10/22/2020 CLINICAL DATA:  Left ankle pain after trip and fall injury. EXAM: LEFT ANKLE COMPLETE - 3+ VIEW COMPARISON:  Left foot 10/24/2018 FINDINGS: Diffuse bone demineralization. Old appearing deformity of the talus. Degenerative changes in the intertarsal joints. No evidence of acute fracture or dislocation. No focal bone lesions. Soft tissue swelling about the left ankle most prominent medially. Vascular calcifications. IMPRESSION: Degenerative changes.  No acute displaced fractures identified. Electronically Signed   By: Lucienne Capers M.D.   On: 10/22/2020 19:30   CT Head Wo Contrast  Result Date: 10/22/2020 CLINICAL DATA:  Head trauma.  Fall while walking. EXAM: CT HEAD WITHOUT CONTRAST TECHNIQUE: Contiguous axial images were obtained from the base of the skull through the vertex without intravenous contrast. COMPARISON:  Head CT 09/29/2018 FINDINGS: Brain: Generalized atrophy. Mild chronic small vessel ischemia. No intracranial hemorrhage, mass effect, or midline shift. No hydrocephalus. The basilar cisterns are patent. No evidence of territorial infarct or acute ischemia. No extra-axial or intracranial fluid collection. Vascular: Atherosclerosis of skullbase  vasculature without hyperdense vessel or abnormal calcification. Skull: No fracture or focal lesion. Sinuses/Orbits: No fracture or acute abnormality. Bilateral cataract resection. The mastoid air cells are clear. Other: None. IMPRESSION: 1. No acute intracranial abnormality. No skull fracture. 2. Generalized atrophy and chronic small vessel ischemia. Electronically Signed   By: Keith Rake M.D.   On: 10/22/2020 19:20   CT Cervical Spine Wo Contrast  Result Date: 10/22/2020 CLINICAL DATA:  Trip and fall while walking. EXAM: CT CERVICAL SPINE WITHOUT CONTRAST TECHNIQUE: Multidetector CT imaging of the cervical spine was performed without intravenous contrast. Multiplanar CT image reconstructions were also generated. COMPARISON:  Cervical spine CT 09/29/2018 FINDINGS: Alignment: Broad-based reversal of normal lordosis, slightly progressed from prior. There is trace anterolisthesis of C2 on C3. Skull base and vertebrae: No acute fracture. Moderate degenerative pannus at C1-C2. Stable prominent Schmorl's node versus hemangioma within superior endplate of C4. Soft tissues and spinal canal: No prevertebral fluid or swelling. No visible canal hematoma. Disc levels: Diffuse degenerative disc disease. Diffuse facet hypertrophy. No high-grade canal stenosis. Upper chest: No acute findings. Other: None. IMPRESSION: 1. No acute fracture or subluxation of the cervical spine. 2. Broad-based reversal of cervical lordosis, slightly progressed from prior. 3. Multilevel degenerative disc disease and facet hypertrophy. Electronically Signed   By: Keith Rake M.D.   On: 10/22/2020 19:27    Procedures Procedures   Medications Ordered in ED Medications - No  data to display  ED Course  I have reviewed the triage vital signs and the nursing notes.  Pertinent imaging results that were available during my care of the patient were reviewed by me and considered in my medical decision making (see chart for details).     MDM Rules/Calculators/A&P                          Pleasant and well-appearing on exam, neurologically intact.  No external signs of trauma.  Obtain CT of head and cervical spine as well as x-ray of thoracic spine and later pt requested L ankle X ray, no obvious injury on exam.  All imaging negative for acute injury.  Patient is pleasant and well-appearing on reassessment, no complaints.  Discharged back to nursing facility. Final Clinical Impression(s) / ED Diagnoses Final diagnoses:  Injury of head, initial encounter  Acute bilateral thoracic back pain    Rx / DC Orders ED Discharge Orders     None        Janya Eveland, Wenda Overland, MD 10/22/20 2018

## 2020-10-22 NOTE — ED Notes (Signed)
Unable to update patients vitals on time. Patient wet her underwear so she was given a mesh pair and a new pad to put on.

## 2020-10-22 NOTE — ED Triage Notes (Signed)
Ems brings pt in from [place] for a fall. She tripped while walking. Pt complains of low back pain.

## 2020-10-22 NOTE — ED Notes (Signed)
[employer] called for pt report

## 2020-10-22 NOTE — ED Notes (Signed)
Pt son, Thil called for pt transport. No response. Voicemail left

## 2020-10-22 NOTE — ED Notes (Signed)
Patient transported to X-ray 

## 2020-10-22 NOTE — ED Notes (Signed)
Patient assisted to the restroom with a walker. Patients pants got wet so she was given a pair of scrub pants.

## 2020-10-22 NOTE — ED Notes (Signed)
Spoke with pt goddaughter, Joycelyn Schmid. Joycelyn Schmid states that son cannot get pt because he lives far away. Joycelyn Schmid does not drive. Joycelyn Schmid states that pt can take a cab back to [place].

## 2020-10-23 ENCOUNTER — Other Ambulatory Visit: Payer: Self-pay | Admitting: Internal Medicine

## 2020-10-23 ENCOUNTER — Encounter (HOSPITAL_COMMUNITY): Payer: Self-pay | Admitting: Emergency Medicine

## 2020-10-23 ENCOUNTER — Emergency Department (HOSPITAL_COMMUNITY): Payer: Medicare HMO

## 2020-10-23 ENCOUNTER — Emergency Department (HOSPITAL_COMMUNITY)
Admission: EM | Admit: 2020-10-23 | Discharge: 2020-10-24 | Disposition: A | Discharge status: Home / Self Care | Payer: Medicare HMO | Attending: Emergency Medicine | Admitting: Emergency Medicine

## 2020-10-23 ENCOUNTER — Other Ambulatory Visit: Payer: Self-pay

## 2020-10-23 DIAGNOSIS — S3992XA Unspecified injury of lower back, initial encounter: Secondary | ICD-10-CM | POA: Diagnosis present

## 2020-10-23 DIAGNOSIS — W07XXXA Fall from chair, initial encounter: Secondary | ICD-10-CM | POA: Insufficient documentation

## 2020-10-23 DIAGNOSIS — R109 Unspecified abdominal pain: Secondary | ICD-10-CM | POA: Insufficient documentation

## 2020-10-23 DIAGNOSIS — N182 Chronic kidney disease, stage 2 (mild): Secondary | ICD-10-CM | POA: Diagnosis not present

## 2020-10-23 DIAGNOSIS — I129 Hypertensive chronic kidney disease with stage 1 through stage 4 chronic kidney disease, or unspecified chronic kidney disease: Secondary | ICD-10-CM | POA: Diagnosis not present

## 2020-10-23 DIAGNOSIS — Z79899 Other long term (current) drug therapy: Secondary | ICD-10-CM | POA: Insufficient documentation

## 2020-10-23 DIAGNOSIS — S300XXA Contusion of lower back and pelvis, initial encounter: Secondary | ICD-10-CM | POA: Insufficient documentation

## 2020-10-23 DIAGNOSIS — Z96651 Presence of right artificial knee joint: Secondary | ICD-10-CM | POA: Insufficient documentation

## 2020-10-23 MED ORDER — OXYCODONE-ACETAMINOPHEN 5-325 MG PO TABS: 1.0000 | ORAL_TABLET | Freq: Three times a day (TID) | ORAL | 0 refills | Status: DC | PRN

## 2020-10-23 NOTE — ED Triage Notes (Signed)
Patient arrives via EMS- transported from [facility].  Patient was evaluated yesterday also for a fall.  EMS reports that the patient slid out of a recliner and co having mid back pain.  No physical injuries noted.  Patient is poor historian, appears to have forgetfulness.

## 2020-10-23 NOTE — ED Notes (Signed)
[facility] has been notified of need for transportation back to [facility]

## 2020-10-23 NOTE — ED Provider Notes (Signed)
[facility] DEPT Provider Note   CSN: [id number] Arrival date & time: 10/23/20  1150     History Chief Complaint  Patient presents with   [name]    [name] is a 87 y.o. female.  Patient fell and hit her lower back.  Patient did not hit her head.  Patient complains of pain in her lower back.  She has had previous surgery there  The history is provided by the patient and medical records. No language interpreter was used.  Fall This is a new problem. The current episode started 6 to 12 hours ago. The problem occurs constantly. The problem has been rapidly worsening. Pertinent negatives include no chest pain, no abdominal pain and no headaches. Nothing aggravates the symptoms. Nothing relieves the symptoms. She has tried nothing for the symptoms.      Past Medical History:  Diagnosis Date   Anxiety and depression    Back pain    lumbar   Fecal incontinence    Dr. Michail Sermon, likely due to loose stools in conjunction with pelvic muscle laxity from aging, recommended to stop Miralax   GI (gastrointestinal bleed)    hx of colon polyps 2004, diverticulosis, internal hemmorhids   Headache    Headache(784.0)    Hydronephrosis    Right 2008 s/p urology eval CT/cysto: observation/monitor   Hyperlipidemia    Hypertension    Knee pain    right chronic   Migraine    on topamax , per neuro   Osteoporosis    Pulmonary nodules    [place]. First seen by CT only 05/04/03, no change on f/u 08/08/06 -Macroscopic changes rml 04/27/07 > resolved april 6,2010 no further w/u    Seborrheic keratosis    Left inferior posterior flank   SVT (supraventricular tachycardia) ([place])    sees Dr.Ross    Patient Active Problem List   Diagnosis Date Noted   Urticaria 11/22/2019   Neck pain 11/08/2019   (HFpEF) heart failure with preserved ejection fraction ([place]) 11/23/2018   Abnormal gait 11/03/2018   Edema of both lower extremities 04/18/2018   Chronic migraine  11/15/2017   Memory loss 11/15/2017   Osteoporosis 07/09/2017   CKD (chronic kidney disease) stage 2, GFR 60-89 ml/min 01/06/2016   Abdominal pain 09/10/2015   Pulmonary nodules/lesions, multiple 09/10/2015   Small bowel obstruction, partial ([place]) 09/10/2015   Arthritis 09/10/2015   Partial small bowel obstruction ([place]) 09/10/2015   PCP NOTES >>>>>> 04/17/2015   Hemorrhoids 09/17/2011   Annual physical exam 03/19/2011   DJD -back pain-on Celebrex    Cough 09/25/2010   DOE (dyspnea on exertion) 08/28/2010   Constipation 01/21/2010   DIVERTICULOSIS OF COLON 09/02/2009   PERSONAL HISTORY OF COLONIC POLYPS 09/02/2009   FULL INCONTINENCE OF FECES 09/02/2009   Essential hypertension 08/10/2007   Headache 08/10/2007   Pulmonary nodules 04/25/2007   Anxiety state 02/03/2007   Depression 02/03/2007   KNEE PAIN, RIGHT, CHRONIC 02/03/2007   Hyperlipidemia 04/26/2006   DIZZINESS 04/26/2006   SVT (supraventricular tachycardia) ([place]) 04/26/2006    Past Surgical History:  Procedure Laterality Date   ABDOMINAL HYSTERECTOMY     APPENDECTOMY     BASAL CELL CARCINOMA EXCISION     BLADDER REPAIR     CATARACT EXTRACTION  2008   Dr.Digby   CHOLECYSTECTOMY     lumbar reconstructive surgery  01/07/2009   R ovary removed     ROTATOR CUFF REPAIR  9/-2011   SHOULDER SURGERY  12-2011  LEFT   TONSILLECTOMY     TOTAL KNEE ARTHROPLASTY     right   trigger finger surgery     L thumb (2014), R ring finger 05-2013     OB History   No obstetric history on file.     Family History  Problem Relation Age of Onset   Breast cancer Mother        later in life   Diabetes Mother        late in life   Heart disease Mother        late onset   Lung cancer Father        apparently had TB   Heart disease Father        late onset   Prostate cancer Father    Colitis Brother    Cancer Brother    Colon cancer Neg Hx     Social History   Tobacco Use   Smoking status: Never   Smokeless tobacco:  Never  Vaping Use   Vaping Use: Never used  Substance Use Topics   Alcohol use: Yes    Alcohol/week: 1.0 standard drink    Types: 1 Glasses of wine per week    Comment: rarely, 1 glass of wine monthly   Drug use: No    Home Medications Prior to Admission medications   Medication Sig Start Date End Date Taking? Authorizing Provider  atorvastatin (LIPITOR) 40 MG tablet TAKE 1 TABLET BY MOUTH EVERYDAY AT BEDTIME 10/23/20  Yes Isaac Bliss, Rayford Halsted, MD  gabapentin (NEURONTIN) 100 MG capsule TAKE 2-3 CAPSULES (200-300 MG TOTAL) BY MOUTH 3 (THREE) TIMES DAILY. Patient taking differently: Take 300 mg by mouth 3 (three) times daily. 03/05/20  Yes Isaac Bliss, Rayford Halsted, MD  olopatadine (PATANOL) 0.1 % ophthalmic solution Place 1 drop into both eyes 2 (two) times daily as needed for allergies. 10/05/18  Yes [provider]  acetaminophen (TYLENOL) 325 MG tablet Take 650 mg by mouth every 6 (six) hours as needed.    [provider]  amLODipine (NORVASC) 5 MG tablet Take 1 tablet (5 mg total) by mouth daily. NEEDS APPOINTMENT FOR FUTURE REFILL / 1st Attempt 04/28/20   Richardo Priest, MD  amoxicillin-clavulanate (AUGMENTIN) 875-125 MG tablet Take 1 tablet by mouth once. Prior to Dentist appts.    [provider]  Bismuth Subsalicylate (PEPTO-BISMOL PO) Take 1 tablet by mouth daily as needed.     [provider]  Ca [id number] (CALCIUM-FOLIC ACID PLUS D PO) Take by mouth daily.    [provider]  celecoxib (CELEBREX) 200 MG capsule TAKE 1 CAPSULE BY MOUTH EVERY DAY AS NEEDED FOR MODERATE PAIN 03/21/20   Isaac Bliss, Rayford Halsted, MD  CRANBERRY PO Take 1 capsule by mouth daily.    [provider]  cyclobenzaprine (FLEXERIL) 10 MG tablet TAKE 1 TABLET BY MOUTH THREE TIMES A DAY AS NEEDED FOR MUSCLE SPASMS 07/29/20   Isaac Bliss, Rayford Halsted, MD  dextromethorphan-guaiFENesin [facility] DM) 30-600 MG 12hr tablet Take 1 tablet by  mouth 2 (two) times daily as needed for cough.    [provider]  escitalopram (LEXAPRO) 10 MG tablet TAKE 1 TABLET BY MOUTH EVERY DAY 07/23/20   Isaac Bliss, Rayford Halsted, MD  famotidine (PEPCID) 20 MG tablet Take 1 tablet (20 mg total) by mouth daily. 06/19/20   Tanda Rockers, MD  Liniments (BLUE-EMU SUPER STRENGTH) CREA Apply 1 application topically at bedtime as needed (dry skin).  [provider]  loperamide (IMODIUM A-D) 2 MG tablet Take 2 mg by mouth 4 (four) times daily as needed for diarrhea or loose stools.    [provider]  loratadine (CLARITIN) 10 MG tablet TAKE 1 TABLET BY MOUTH EVERY DAY 02/13/20   Tanda Rockers, MD  meloxicam (MOBIC) 7.5 MG tablet Take one tab daily as neede Patient not taking: No sig reported 03/25/20   Isaac Bliss, Rayford Halsted, MD  Menthol, Topical Analgesic, (BLUE-EMU MAXIMUM STRENGTH EX) Apply topically. Apply topically as needed.    [provider]  Multiple Vitamins-Minerals (CENTRUM SILVER 50+WOMEN) TABS take 1 tablet by oral route every day    [provider]  NON FORMULARY Take 2 capsules by mouth 2 (two) times daily. [facility]    [provider]  oxyCODONE-acetaminophen (PERCOCET) 5-325 MG tablet Take 1 tablet by mouth every 8 (eight) hours as needed for severe pain. 10/23/20   Milton Ferguson, MD  pantoprazole (PROTONIX) 40 MG tablet TAKE 1 TABLET (40 MG TOTAL) BY MOUTH DAILY. TAKE 30-60 MIN BEFORE FIRST MEAL OF THE DAY 12/19/19   Tanda Rockers, MD  Phenylephrine-Acetaminophen (SINUS PRESSURE + PAIN) 5-325 MG TABS Take by mouth as directed.    [provider]  Potassium Chloride ER 20 MEQ TBCR TAKE 1 TABLET BY MOUTH EVERY DAY 09/26/20   Isaac Bliss, Rayford Halsted, MD  Probiotic Product (PROBIOTIC DAILY PO) Take 1 capsule by mouth daily.     [provider]  simethicone (MYLICON) 80 MG chewable tablet Chew 80 mg by mouth every 6 (six) hours as needed for flatulence.    [provider]  trimethoprim (TRIMPEX) 100 MG tablet Take 100 mg by mouth daily.    [provider]    Allergies    Amitriptyline hcl, Carisoprodol, Codeine, Fosamax [alendronate sodium], Meloxicam, Metronidazole, Nortriptyline hcl, Oxaprozin, Promethazine hcl, Propranolol, Rofecoxib, Sumatriptan, Tramadol, and Venlafaxine  Review of Systems   Review of Systems  Constitutional:  Negative for appetite change and fatigue.  HENT:  Negative for congestion, ear discharge and sinus pressure.   Eyes:  Negative for discharge.  Respiratory:  Negative for cough.   Cardiovascular:  Negative for chest pain.  Gastrointestinal:  Negative for abdominal pain and diarrhea.  Genitourinary:  Negative for frequency and hematuria.  Musculoskeletal:  Positive for back pain.  Skin:  Negative for rash.  Neurological:  Negative for seizures and headaches.  Psychiatric/Behavioral:  Negative for hallucinations.    Physical Exam Updated Vital Signs BP (!) 189/93   Pulse 80   Temp 98.1 F (36.7 C) (Oral)   Resp 20   Ht 5\' 3"  (1.6 m)   Wt 70.3 kg   SpO2 96%   BMI 27.46 kg/m   Physical Exam Vitals reviewed.  Constitutional:      Appearance: She is well-developed.  HENT:     Head: Normocephalic.     Nose: Nose normal.  Eyes:     General: No scleral icterus.    Conjunctiva/sclera: Conjunctivae normal.  Neck:     Thyroid: No thyromegaly.  Cardiovascular:     Rate and Rhythm: Normal rate and regular rhythm.     Heart sounds: No murmur heard.   No friction rub. No gallop.  Pulmonary:     Breath sounds: No stridor. No wheezing or rales.  Chest:     Chest wall: No tenderness.  Abdominal:     General: There is no distension.     Tenderness: There is  no abdominal tenderness. There is no rebound.  Musculoskeletal:        General: Normal range of motion.     Cervical back: Neck supple.     Comments: Mild tenderness lumbar spine  Lymphadenopathy:     Cervical: No cervical adenopathy.   Skin:    Findings: No erythema or rash.  Neurological:     Mental Status: She is oriented to person, place, and time.     Motor: No abnormal muscle tone.     Coordination: Coordination normal.  Psychiatric:        Behavior: Behavior normal.    ED Results / Procedures / Treatments   Labs (all labs ordered are listed, but only abnormal results are displayed) Labs Reviewed - No data to display  EKG None  Radiology CT ABDOMEN PELVIS WO CONTRAST  Result Date: 10/23/2020 CLINICAL DATA:  Mid back pain, abdominal pain post fall EXAM: CT ABDOMEN AND PELVIS WITHOUT CONTRAST TECHNIQUE: Multidetector CT imaging of the abdomen and pelvis was performed following the standard protocol without IV contrast. COMPARISON:  08/07/2018 FINDINGS: Lower chest: No pleural or pericardial effusion. Nodules at the left lung base in the inferior right middle lobe been present since 09/12/2015, presumably benign. Hepatobiliary: Post cholecystectomy. Stable probable cyst in hepatic segment 6. No new liver lesion or biliary ductal dilatation. Pancreas: Unremarkable. No pancreatic ductal dilatation or surrounding inflammatory changes. Spleen: Normal in size without focal abnormality. Adrenals/Urinary Tract: Normal adrenal glands. Left kidney unremarkable. Marked right renal parenchymal atrophy as before with chronic hydronephrosis and proximal ureterectasis. Urinary bladder is distended. Stomach/Bowel: Posterior gastric diverticulum as before. Stomach is nondistended. Small bowel decompressed. Appendix surgically absent. The colon is nondilated, with scattered sigmoid diverticula; no adjacent inflammatory change. Vascular/Lymphatic: Moderate calcified aortic plaque without aneurysm. Single borderline enlarged 1 cm left para-aortic lymph node just above the bifurcation. No other abdominal or pelvic adenopathy. Reproductive: Status post hysterectomy. No adnexal masses. Other: No ascites.  No free air. Musculoskeletal:  Solid-appearing instrumented fusion L2-L5. Adjacent level degenerative disc disease L1-2 and L5-S1. Negative for fracture or worrisome bone lesion. IMPRESSION: 1. No acute findings. 2. Distended urinary bladder. 3. Sigmoid diverticulosis. 4.  Aortic Atherosclerosis (ICD10-170.0). Electronically Signed   By: Lucrezia Europe M.D.   On: 10/23/2020 13:52   DG Thoracic Spine 2 View  Result Date: 10/22/2020 CLINICAL DATA:  Thoracic back pain after a fall. Trip and fall injury. EXAM: THORACIC SPINE 2 VIEWS COMPARISON:  07/04/2015 FINDINGS: The thoracic spine is tilted towards the left. This is likely positional but could indicate mild scoliosis, new since the prior study. There is diffuse bone demineralization. Degenerative changes throughout the thoracic spine with narrowed interspaces and endplate osteophyte formation. No anterior subluxation. No vertebral compression deformities. No paraspinal soft tissue mass or infiltration suggested. Postoperative changes seen in the lumbar spine. IMPRESSION: Diffuse bone demineralization and degenerative changes. No acute displaced fractures identified. Electronically Signed   By: Lucienne Capers M.D.   On: 10/22/2020 19:28   DG Ankle Complete Left  Result Date: 10/22/2020 CLINICAL DATA:  Left ankle pain after trip and fall injury. EXAM: LEFT ANKLE COMPLETE - 3+ VIEW COMPARISON:  Left foot 10/24/2018 FINDINGS: Diffuse bone demineralization. Old appearing deformity of the talus. Degenerative changes in the intertarsal joints. No evidence of acute fracture or dislocation. No focal bone lesions. Soft tissue swelling about the left ankle most prominent medially. Vascular calcifications. IMPRESSION: Degenerative changes.  No acute displaced fractures identified. Electronically Signed   By: Lucienne Capers  M.D.   On: 10/22/2020 19:30   CT Head Wo Contrast  Result Date: 10/22/2020 CLINICAL DATA:  Head trauma.  Fall while walking. EXAM: CT HEAD WITHOUT CONTRAST TECHNIQUE: Contiguous  axial images were obtained from the base of the skull through the vertex without intravenous contrast. COMPARISON:  Head CT 09/29/2018 FINDINGS: Brain: Generalized atrophy. Mild chronic small vessel ischemia. No intracranial hemorrhage, mass effect, or midline shift. No hydrocephalus. The basilar cisterns are patent. No evidence of territorial infarct or acute ischemia. No extra-axial or intracranial fluid collection. Vascular: Atherosclerosis of skullbase vasculature without hyperdense vessel or abnormal calcification. Skull: No fracture or focal lesion. Sinuses/Orbits: No fracture or acute abnormality. Bilateral cataract resection. The mastoid air cells are clear. Other: None. IMPRESSION: 1. No acute intracranial abnormality. No skull fracture. 2. Generalized atrophy and chronic small vessel ischemia. Electronically Signed   By: Keith Rake M.D.   On: 10/22/2020 19:20   CT Cervical Spine Wo Contrast  Result Date: 10/22/2020 CLINICAL DATA:  Trip and fall while walking. EXAM: CT CERVICAL SPINE WITHOUT CONTRAST TECHNIQUE: Multidetector CT imaging of the cervical spine was performed without intravenous contrast. Multiplanar CT image reconstructions were also generated. COMPARISON:  Cervical spine CT 09/29/2018 FINDINGS: Alignment: Broad-based reversal of normal lordosis, slightly progressed from prior. There is trace anterolisthesis of C2 on C3. Skull base and vertebrae: No acute fracture. Moderate degenerative pannus at C1-C2. Stable prominent Schmorl's node versus hemangioma within superior endplate of C4. Soft tissues and spinal canal: No prevertebral fluid or swelling. No visible canal hematoma. Disc levels: Diffuse degenerative disc disease. Diffuse facet hypertrophy. No high-grade canal stenosis. Upper chest: No acute findings. Other: None. IMPRESSION: 1. No acute fracture or subluxation of the cervical spine. 2. Broad-based reversal of cervical lordosis, slightly progressed from prior. 3. Multilevel  degenerative disc disease and facet hypertrophy. Electronically Signed   By: Keith Rake M.D.   On: 10/22/2020 19:27    Procedures Procedures   Medications Ordered in ED Medications - No data to display  ED Course  I have reviewed the triage vital signs and the nursing notes.  Pertinent labs & imaging results that were available during my care of the patient were reviewed by me and considered in my medical decision making (see chart for details).    MDM Rules/Calculators/A&P                         Patient with contusion to lumbar spine.  CT scan does not show any compression fractures.  She is sent home with some pain medicine will follow up with her doctor Final Clinical Impression(s) / ED Diagnoses Final diagnoses:  Lumbar contusion, initial encounter    Rx / DC Orders ED Discharge Orders          Ordered    oxyCODONE-acetaminophen (PERCOCET) 5-325 MG tablet  Every 8 hours PRN,   Status:  Discontinued        10/23/20 1441    oxyCODONE-acetaminophen (PERCOCET) 5-325 MG tablet  Every 8 hours PRN        10/23/20 1441             Milton Ferguson, MD 10/26/20 1651

## 2020-10-23 NOTE — Discharge Instructions (Addendum)
Follow-up with your family doctor next week for recheck of your contusion to your lower back

## 2020-10-24 MED ORDER — ACETAMINOPHEN 325 MG PO TABS
650.0000 mg | ORAL_TABLET | Freq: Once | ORAL | Status: AC
  Administered 2020-10-24: 650 mg via ORAL
  Filled 2020-10-24: qty 2

## 2020-10-28 ENCOUNTER — Other Ambulatory Visit: Payer: Self-pay | Admitting: Internal Medicine

## 2020-10-28 DIAGNOSIS — H539 Unspecified visual disturbance: Secondary | ICD-10-CM

## 2020-10-30 ENCOUNTER — Other Ambulatory Visit: Payer: Self-pay

## 2020-10-30 ENCOUNTER — Other Ambulatory Visit (INDEPENDENT_AMBULATORY_CARE_PROVIDER_SITE_OTHER): Payer: Medicare HMO

## 2020-10-30 DIAGNOSIS — H539 Unspecified visual disturbance: Secondary | ICD-10-CM | POA: Diagnosis not present

## 2020-10-30 LAB — CBC WITH DIFFERENTIAL/PLATELET
Basophils Absolute: 0.1 10*3/uL (ref 0.0–0.1)
Basophils Relative: 0.8 % (ref 0.0–3.0)
Eosinophils Absolute: 0.1 10*3/uL (ref 0.0–0.7)
Eosinophils Relative: 2.3 % (ref 0.0–5.0)
HCT: 36.3 % (ref 36.0–46.0)
Hemoglobin: 12.1 g/dL (ref 12.0–15.0)
Lymphocytes Relative: 16.6 % (ref 12.0–46.0)
Lymphs Abs: 1.1 10*3/uL (ref 0.7–4.0)
MCHC: 33.4 g/dL (ref 30.0–36.0)
MCV: 91.9 fl (ref 78.0–100.0)
Monocytes Absolute: 0.8 10*3/uL (ref 0.1–1.0)
Monocytes Relative: 12 % (ref 3.0–12.0)
Neutro Abs: 4.4 10*3/uL (ref 1.4–7.7)
Neutrophils Relative %: 68.3 % (ref 43.0–77.0)
Platelets: 379 10*3/uL (ref 150.0–400.0)
RBC: 3.95 Mil/uL (ref 3.87–5.11)
RDW: 14.1 % (ref 11.5–15.5)
WBC: 6.4 10*3/uL (ref 4.0–10.5)

## 2020-10-30 LAB — TSH: TSH: 2.42 u[IU]/mL (ref 0.35–5.50)

## 2020-10-30 LAB — C-REACTIVE PROTEIN: CRP: 1.4 mg/dL (ref 0.5–20.0)

## 2020-10-30 LAB — HEMOGLOBIN A1C: Hgb A1c MFr Bld: 6.3 % (ref 4.6–6.5)

## 2020-10-30 LAB — SEDIMENTATION RATE: Sed Rate: 30 mm/hr (ref 0–30)

## 2020-11-04 ENCOUNTER — Other Ambulatory Visit: Payer: Self-pay | Admitting: Internal Medicine

## 2020-11-10 ENCOUNTER — Other Ambulatory Visit: Payer: Self-pay

## 2020-11-11 ENCOUNTER — Encounter: Payer: Self-pay | Admitting: Internal Medicine

## 2020-11-11 ENCOUNTER — Ambulatory Visit (INDEPENDENT_AMBULATORY_CARE_PROVIDER_SITE_OTHER): Payer: Medicare HMO | Admitting: Internal Medicine

## 2020-11-11 VITALS — BP 130/70 | HR 79 | Temp 98.2°F | Ht 64.0 in | Wt 154.4 lb

## 2020-11-11 DIAGNOSIS — R413 Other amnesia: Secondary | ICD-10-CM

## 2020-11-11 DIAGNOSIS — N182 Chronic kidney disease, stage 2 (mild): Secondary | ICD-10-CM

## 2020-11-11 DIAGNOSIS — I10 Essential (primary) hypertension: Secondary | ICD-10-CM | POA: Diagnosis not present

## 2020-11-11 DIAGNOSIS — I5032 Chronic diastolic (congestive) heart failure: Secondary | ICD-10-CM | POA: Diagnosis not present

## 2020-11-11 DIAGNOSIS — Z0000 Encounter for general adult medical examination without abnormal findings: Secondary | ICD-10-CM | POA: Diagnosis not present

## 2020-11-11 DIAGNOSIS — E538 Deficiency of other specified B group vitamins: Secondary | ICD-10-CM | POA: Insufficient documentation

## 2020-11-11 DIAGNOSIS — E785 Hyperlipidemia, unspecified: Secondary | ICD-10-CM

## 2020-11-11 DIAGNOSIS — R7302 Impaired glucose tolerance (oral): Secondary | ICD-10-CM | POA: Insufficient documentation

## 2020-11-11 LAB — LIPID PANEL
Cholesterol: 124 mg/dL (ref 0–200)
HDL: 40.5 mg/dL (ref >39.00)
LDL Cholesterol: 50 mg/dL (ref 0–99)
NonHDL: 83.73
Total CHOL/HDL Ratio: 3
Triglycerides: 167 mg/dL — ABNORMAL HIGH (ref 0.0–149.0)
VLDL: 33.4 mg/dL (ref 0.0–40.0)

## 2020-11-11 LAB — CBC WITH DIFFERENTIAL/PLATELET
Basophils Absolute: 0.1 10*3/uL (ref 0.0–0.1)
Basophils Relative: 1 % (ref 0.0–3.0)
Eosinophils Absolute: 0.2 10*3/uL (ref 0.0–0.7)
Eosinophils Relative: 2.6 % (ref 0.0–5.0)
HCT: 36.3 % (ref 36.0–46.0)
Hemoglobin: 12.1 g/dL (ref 12.0–15.0)
Lymphocytes Relative: 16.3 % (ref 12.0–46.0)
Lymphs Abs: 1 10*3/uL (ref 0.7–4.0)
MCHC: 33.3 g/dL (ref 30.0–36.0)
MCV: 92.5 fl (ref 78.0–100.0)
Monocytes Absolute: 0.6 10*3/uL (ref 0.1–1.0)
Monocytes Relative: 9.8 % (ref 3.0–12.0)
Neutro Abs: 4.2 10*3/uL (ref 1.4–7.7)
Neutrophils Relative %: 70.3 % (ref 43.0–77.0)
Platelets: 327 10*3/uL (ref 150.0–400.0)
RBC: 3.92 Mil/uL (ref 3.87–5.11)
RDW: 14.2 % (ref 11.5–15.5)
WBC: 6 10*3/uL (ref 4.0–10.5)

## 2020-11-11 LAB — TSH: TSH: 1.89 u[IU]/mL (ref 0.35–5.50)

## 2020-11-11 LAB — HEMOGLOBIN A1C: Hgb A1c MFr Bld: 6.3 % (ref 4.6–6.5)

## 2020-11-11 LAB — COMPREHENSIVE METABOLIC PANEL
ALT: 7 U/L (ref 0–35)
AST: 16 U/L (ref 0–37)
Albumin: 3.9 g/dL (ref 3.5–5.2)
Alkaline Phosphatase: 118 U/L — ABNORMAL HIGH (ref 39–117)
BUN: 12 mg/dL (ref 6–23)
CO2: 27 mEq/L (ref 19–32)
Calcium: 9.5 mg/dL (ref 8.4–10.5)
Chloride: 106 mEq/L (ref 96–112)
Creatinine, Ser: 0.9 mg/dL (ref 0.40–1.20)
GFR: 56.97 mL/min — ABNORMAL LOW (ref >60.00)
Glucose, Bld: 95 mg/dL (ref 70–99)
Potassium: 4.6 mEq/L (ref 3.5–5.1)
Sodium: 141 mEq/L (ref 135–145)
Total Bilirubin: 0.6 mg/dL (ref 0.2–1.2)
Total Protein: 6.2 g/dL (ref 6.0–8.3)

## 2020-11-11 LAB — VITAMIN D 25 HYDROXY (VIT D DEFICIENCY, FRACTURES): VITD: 34.41 ng/mL (ref 30.00–100.00)

## 2020-11-11 LAB — VITAMIN B12: Vitamin B-12: 157 pg/mL — ABNORMAL LOW (ref 211–911)

## 2020-11-11 NOTE — Patient Instructions (Signed)
-  Nice seeing you today!!  -Lab work today; will notify you once results are available.  -remember your 2nd [place] booster and your shingles vaccines at your pharmacy.  -Schedule follow up in 6 months.

## 2020-11-11 NOTE — Progress Notes (Signed)
Established Patient Office Visit     This visit occurred during the SARS-CoV-2 public health emergency.  Safety protocols were in place, including screening questions prior to the visit, additional usage of staff PPE, and extensive cleaning of exam room while observing appropriate contact time as indicated for disinfecting solutions.    CC/Reason for Visit: Annual preventive exam and subsequent Medicare wellness visit  HPI: [name] is a 87 y.o. female who is coming in today for the above mentioned reasons. Past Medical History is significant for: Depression, hyperlipidemia, hypertension, GERD, heart failure with preserved ejection fraction, multiple joint osteoarthritis and mild cognitive impairment followed by neurology.  She has no acute concerns today other than a myriad of orthopedic complaints that mainly centered around her left ankle and right hip that are chronic.  She is overdue for routine eye and dental care and states that she is scheduling these appointments soon.  She has no perceived hearing issues, she walks about 10 to 15 minutes a day at her assisted living facility.  She visited the emergency department on July 7 after a fall without significant injury.  She has elected to defer all cancer screening due to age, I agree.  She will be getting her second COVID booster this weekend, she is also due for shingles vaccinations.   Past Medical/Surgical History: Past Medical History:  Diagnosis Date   Anxiety and depression    Back pain    lumbar   Fecal incontinence    Dr. Michail Sermon, likely due to loose stools in conjunction with pelvic muscle laxity from aging, recommended to stop Miralax   GI (gastrointestinal bleed)    hx of colon polyps 2004, diverticulosis, internal hemmorhids   Headache    Headache(784.0)    Hydronephrosis    Right 2008 s/p urology eval CT/cysto: observation/monitor   Hyperlipidemia    Hypertension    Knee pain    right chronic    Migraine    on topamax , per neuro   Osteoporosis    Pulmonary nodules    [place]. First seen by CT only 05/04/03, no change on f/u 08/08/06 -Macroscopic changes rml 04/27/07 > resolved april 6,2010 no further w/u    Seborrheic keratosis    Left inferior posterior flank   SVT (supraventricular tachycardia) ([place])    sees Dr.Ross    Past Surgical History:  Procedure Laterality Date   ABDOMINAL HYSTERECTOMY     APPENDECTOMY     BASAL CELL CARCINOMA EXCISION     BLADDER REPAIR     CATARACT EXTRACTION  2008   Dr.Digby   CHOLECYSTECTOMY     lumbar reconstructive surgery  01/07/2009   R ovary removed     ROTATOR CUFF REPAIR  9/-2011   SHOULDER SURGERY  12-2011   LEFT   TONSILLECTOMY     TOTAL KNEE ARTHROPLASTY     right   trigger finger surgery     L thumb (2014), R ring finger 05-2013    Social History:  reports that she has never smoked. She has never used smokeless tobacco. She reports current alcohol use of about 1.0 standard drink of alcohol per week. She reports that she does not use drugs.  Allergies: Allergies  Allergen Reactions   Amitriptyline Hcl     REACTION: hallucinations   Carisoprodol     REACTION: nightmares,rash,tachycardia   Codeine     REACTION: "deathly ill" nauesa, dizzy,weak   Fosamax [Alendronate Sodium] Other (See Comments)  Dental issues   Meloxicam     REACTION: sensitive to sun; facial redness; may cause severe abd pain   Metronidazole     REACTION: abd pain   Nortriptyline Hcl     REACTION: hallucinations   Oxaprozin     REACTION: abd pain   Promethazine Hcl     REACTION: spastic arms and legs flailing while awake and unusual behavior while sleep - walking all over house during night   Propranolol Other (See Comments)    hallucinations   Rofecoxib     REACTION: rash   Sumatriptan     REACTION: abdominal pain, blurry vision, drowsiness   Tramadol Other (See Comments)    Dizziness and constipation   Venlafaxine     Hallucinations      Family History:  Family History  Problem Relation Age of Onset   Breast cancer Mother        later in life   Diabetes Mother        late in life   Heart disease Mother        late onset   Lung cancer Father        apparently had TB   Heart disease Father        late onset   Prostate cancer Father    Colitis Brother    Cancer Brother    Colon cancer Neg Hx      Current Outpatient Medications:    acetaminophen (TYLENOL) 325 MG tablet, Take 650 mg by mouth every 6 (six) hours as needed., Disp: , Rfl:    amLODipine (NORVASC) 5 MG tablet, Take 1 tablet (5 mg total) by mouth daily. NEEDS APPOINTMENT FOR FUTURE REFILL / 1st Attempt, Disp: 90 tablet, Rfl: 0   amoxicillin-clavulanate (AUGMENTIN) 875-125 MG tablet, Take 1 tablet by mouth once. Prior to Dentist appts., Disp: , Rfl:    atorvastatin (LIPITOR) 40 MG tablet, TAKE 1 TABLET BY MOUTH EVERYDAY AT BEDTIME, Disp: 90 tablet, Rfl: 1   Bismuth Subsalicylate (PEPTO-BISMOL PO), Take 1 tablet by mouth daily as needed. , Disp: , Rfl:    Ca [id number] (CALCIUM-FOLIC ACID PLUS D PO), Take by mouth daily., Disp: , Rfl:    celecoxib (CELEBREX) 200 MG capsule, TAKE 1 CAPSULE BY MOUTH EVERY DAY AS NEEDED FOR MODERATE PAIN, Disp: 90 capsule, Rfl: 1   CRANBERRY PO, Take 1 capsule by mouth daily., Disp: , Rfl:    cyclobenzaprine (FLEXERIL) 10 MG tablet, TAKE 1 TABLET BY MOUTH THREE TIMES A DAY AS NEEDED FOR MUSCLE SPASMS, Disp: 30 tablet, Rfl: 2   dextromethorphan-guaiFENesin (MUCINEX DM) 30-600 MG 12hr tablet, Take 1 tablet by mouth 2 (two) times daily as needed for cough., Disp: , Rfl:    escitalopram (LEXAPRO) 10 MG tablet, TAKE 1 TABLET BY MOUTH EVERY DAY, Disp: 90 tablet, Rfl: 1   famotidine (PEPCID) 20 MG tablet, Take 1 tablet (20 mg total) by mouth daily., Disp: 30 tablet, Rfl: 6   gabapentin (NEURONTIN) 100 MG capsule, TAKE 2-3 CAPSULES (200-300 MG TOTAL) BY MOUTH 3 (THREE) TIMES DAILY. (Patient taking differently: Take 300  mg by mouth 3 (three) times daily.), Disp: 810 capsule, Rfl: 1   Liniments (BLUE-EMU SUPER STRENGTH) CREA, Apply 1 application topically at bedtime as needed (dry skin)., Disp: , Rfl:    loperamide (IMODIUM A-D) 2 MG tablet, Take 2 mg by mouth 4 (four) times daily as needed for diarrhea or loose stools., Disp: , Rfl:    loratadine (CLARITIN) 10 MG tablet,  TAKE 1 TABLET BY MOUTH EVERY DAY, Disp: 90 tablet, Rfl: 1   meloxicam (MOBIC) 7.5 MG tablet, Take one tab daily as neede, Disp: 30 tablet, Rfl: 2   Menthol, Topical Analgesic, (BLUE-EMU MAXIMUM STRENGTH EX), Apply topically. Apply topically as needed., Disp: , Rfl:    Multiple Vitamins-Minerals (CENTRUM SILVER 50+WOMEN) TABS, take 1 tablet by oral route every day, Disp: , Rfl:    NON FORMULARY, Take 2 capsules by mouth 2 (two) times daily. [employer]	[place], Disp: , Rfl:    olopatadine (PATANOL) 0.1 % ophthalmic solution, Place 1 drop into both eyes 2 (two) times daily as needed for allergies., Disp: , Rfl:    oxyCODONE-acetaminophen (PERCOCET) 5-325 MG tablet, Take 1 tablet by mouth every 8 (eight) hours as needed for severe pain., Disp: 20 tablet, Rfl: 0   pantoprazole (PROTONIX) 40 MG tablet, TAKE 1 TABLET (40 MG TOTAL) BY MOUTH DAILY. TAKE 30-60 MIN BEFORE FIRST MEAL OF THE DAY, Disp: 90 tablet, Rfl: 1   Phenylephrine-Acetaminophen (SINUS PRESSURE + PAIN) 5-325 MG TABS, Take by mouth as directed., Disp: , Rfl:    Potassium Chloride ER 20 MEQ TBCR, TAKE 1 TABLET BY MOUTH EVERY DAY, Disp: 90 tablet, Rfl: 0   Probiotic Product (PROBIOTIC DAILY PO), Take 1 capsule by mouth daily. , Disp: , Rfl:    trimethoprim (TRIMPEX) 100 MG tablet, Take 100 mg by mouth daily., Disp: , Rfl:   Review of Systems:  Constitutional: Denies fever, chills, diaphoresis, appetite change and fatigue.  HEENT: Denies photophobia, eye pain, redness, hearing loss, ear pain, congestion, sore throat, rhinorrhea, sneezing, mouth sores, trouble swallowing, neck pain, neck stiffness and  tinnitus.   Respiratory: Denies SOB, DOE, cough, chest tightness,  and wheezing.   Cardiovascular: Denies chest pain, palpitations and leg swelling.  Gastrointestinal: Denies nausea, vomiting, abdominal pain, diarrhea, constipation, blood in stool and abdominal distention.  Genitourinary: Denies dysuria, urgency, frequency, hematuria, flank pain and difficulty urinating.  Endocrine: Denies: hot or cold intolerance, sweats, changes in hair or nails, polyuria, polydipsia. Musculoskeletal: Positive for myalgias, back pain, joint swelling, arthralgias and gait problem.  Skin: Denies pallor, rash and wound.  Neurological: Denies dizziness, seizures, syncope, weakness, light-headedness, numbness and headaches.  Hematological: Denies adenopathy. Easy bruising, personal or family bleeding history  Psychiatric/Behavioral: Denies suicidal ideation, mood changes, confusion, nervousness, sleep disturbance and agitation    Physical Exam: Vitals:   11/11/20 1054  BP: 130/70  Pulse: 79  Temp: 98.2 F (36.8 C)  TempSrc: Oral  SpO2: 97%  Weight: 154 lb 6.4 oz (70 kg)  Height: 5' 4"  (1.626 m)    Body mass index is 26.5 kg/m.   = Constitutional: NAD, calm, comfortable Eyes: PERRL, lids and conjunctivae normal ENMT: Mucous membranes are moist. Posterior pharynx clear of any exudate or lesions. Normal dentition. Tympanic membrane is pearly white, no erythema or bulging. Neck: normal, supple, no masses, no thyromegaly Respiratory: clear to auscultation bilaterally, no wheezing, no crackles. Normal respiratory effort. No accessory muscle use.  Cardiovascular: Regular rate and rhythm, no murmurs / rubs / gallops. No extremity edema. 2+ pedal pulses. No carotid bruits.  Abdomen: no tenderness, no masses palpated. No hepatosplenomegaly. Bowel sounds positive.  Musculoskeletal: no clubbing / cyanosis. No joint deformity upper and lower extremities. Good ROM, no contractures. Normal muscle tone.  Skin:  no rashes, lesions, ulcers. No induration Neurologic: CN 2-12 grossly intact. Sensation intact, DTR normal. Strength 5/5 in all 4.  Psychiatric: Normal judgment and insight. Alert and oriented x  3. Normal mood.    Subsequent Medicare wellness visit   1. Risk factors, based on past  M,S,F -cardiovascular disease risk factors include age, history of hyperlipidemia, history of hypertension, obesity   2.  Physical activities: Quite sedentary   3.  Depression/mood: History of depression although mood is stable   4.  Hearing: No perceived issues   5.  ADL's: Is dependent on complex ADLs, she is able to dress her self and groom herself   6.  Fall risk: Moderate to high fall risk due to memory issues and orthopedic complaints with a recent fall   7.  Home safety: No problems identified   8.  Height weight, and visual acuity: height and weight as above, vision:  Vision Screening   Right eye Left eye Both eyes  Without correction na na na  With correction     Comments: NA - patient did not bring her walker - unable to complete    9.  Counseling: Advised to update her vaccinations and to try and incorporate some supervised physical activity at her ALF   10. Lab orders based on risk factors: Laboratory update will be reviewed   11. Referral : None today   12. Care plan: Follow-up with me in 6 months   13. Cognitive assessment: Mild to moderate cognitive impairment   14. Screening: Patient provided with a written and personalized 5-10 year screening schedule in the AVS. yes   15. Provider List Update: PCP only  16. Advance Directives: Full code   17. Opioids: Patient is not on any opioid prescriptions and has no risk factors for a substance use disorder.   [place] Office Visit from 11/11/2020 in [facility] at [facility]  PHQ-9 Total Score 5       Fall Risk  11/11/2020 06/19/2020 06/17/2020 11/13/2019 05/22/2019  Falls in the past year? 1 1 1 1 1   Number falls in past  yr: 1 1 1 1 1   Injury with Fall? 1 1 1 1  0  Risk for fall due to : - - - Impaired balance/gait -     Impression and Plan:  Encounter for preventive health examination -Advised routine eye and dental care. -She will get her second COVID booster and her shingles vaccinations at the pharmacy. -Screening labs today. -Healthy lifestyle discussed in detail. -She has decided to forego all cancer screening and I agree due to age.  Chronic heart failure with preserved ejection fraction (HCC) -Compensated  CKD (chronic kidney disease) stage 2, GFR 60-89 ml/min -Check kidney function today, last creatinine was 0.870 in April 2022.  Essential hypertension  - Plan: CBC with Differential/Platelet, Comprehensive metabolic panel, Hemoglobin A1c, Lipid panel, TSH, Vitamin B12, VITAMIN D 25 Hydroxy (Vit-D Deficiency, Fractures) -Blood pressure is stable on amlodipine 5 mg daily.  Memory loss -Followed by neurology  Hyperlipidemia, unspecified hyperlipidemia type -Check lipids today, last lipid panel in January 21 with a total cholesterol of 146, triglycerides 148 and LDL 72.  She is on daily statin.    Patient Instructions  -Nice seeing you today!!  -Lab work today; will notify you once results are available.  -remember your 2nd [place] booster and your shingles vaccines at your pharmacy.  -Schedule follow up in 6 months.      Lelon Frohlich, MD [facility] at [facility]

## 2020-11-21 ENCOUNTER — Other Ambulatory Visit: Payer: Self-pay | Admitting: Internal Medicine

## 2020-12-02 IMAGING — MR MRI LUMBAR SPINE WITHOUT CONTRAST
4 of 6 series · 19 of 48 positions shown · non-contrast
Comparison: Lumbar spine radiograph 08/02/2012

CLINICAL DATA: Fall

EXAM:
MRI LUMBAR SPINE WITHOUT CONTRAST
TECHNIQUE: Multiplanar, multisequence MR imaging of the lumbar spine was
performed. No intravenous contrast was administered.

[Series 4: T1 · sagittal · 4.0mm · 0.51mm/px · 3 of 12 slices shown (1 of 2)]
[im 1/12]
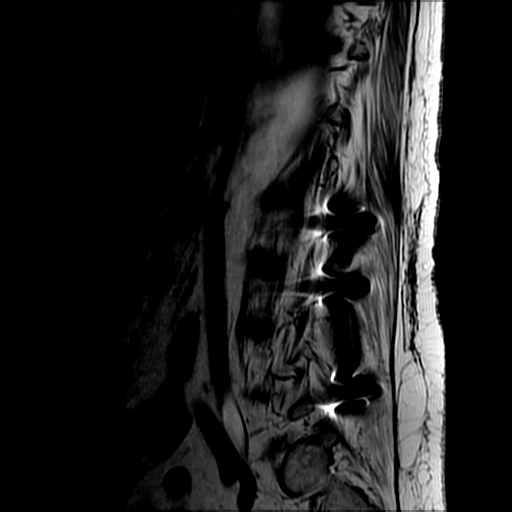
[im 8/12]
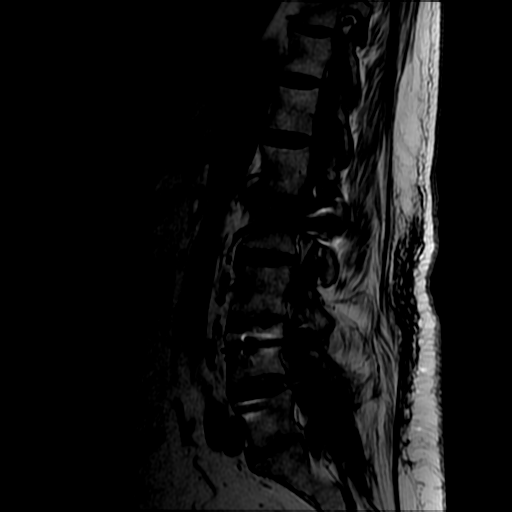
[im 12/12]
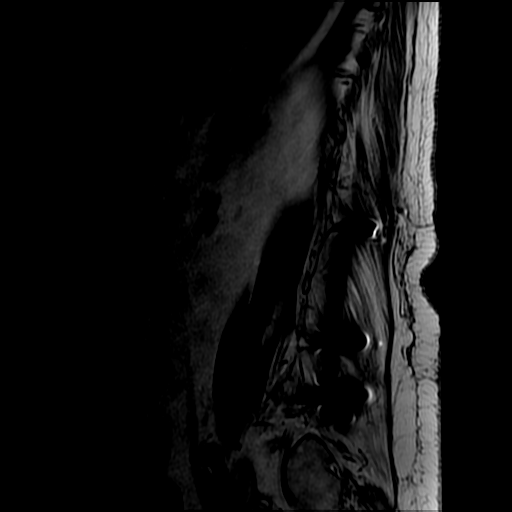

[Series 5: T2 post-contrast · sagittal · 4.0mm · 0.51mm/px · 4 of 12 slices shown]
[im 1/12]
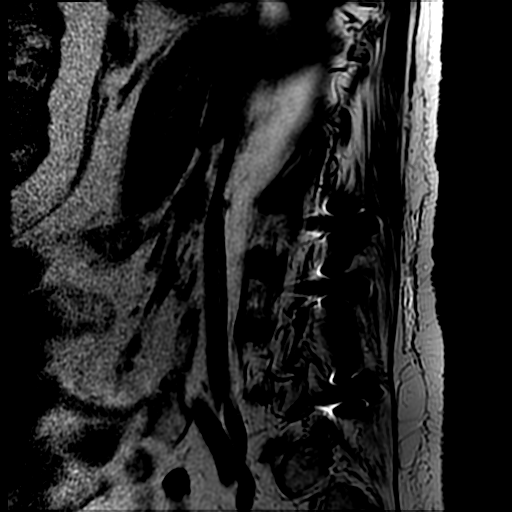
[im 4/12]
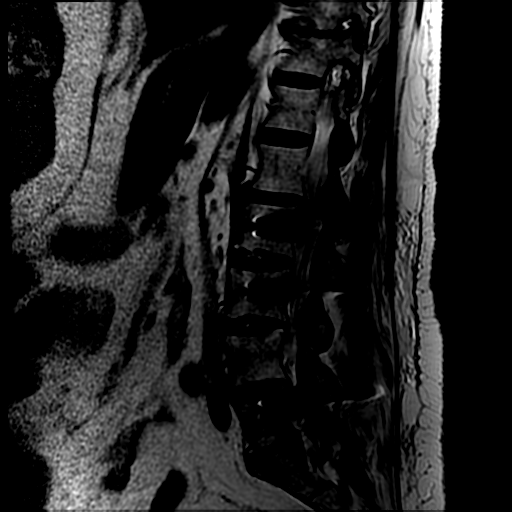
[im 8/12]
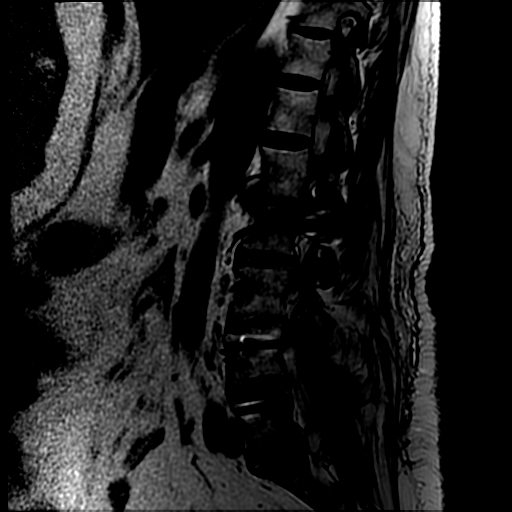
[im 12/12]
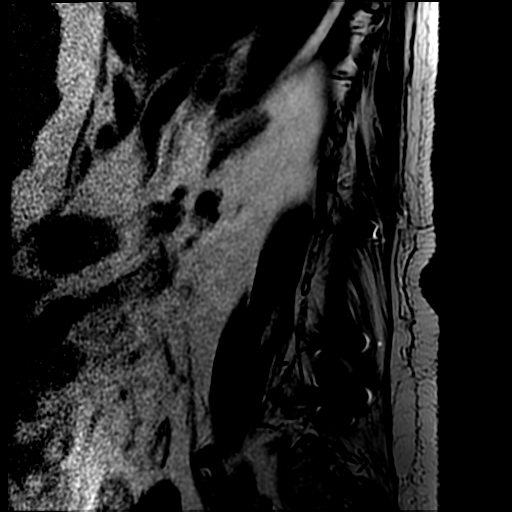

[Series 7: T2 · axial · 4.0mm · 0.39mm/px · z∈[-140,+60]mm · 9 of 35 slices shown]
[im 1/35]
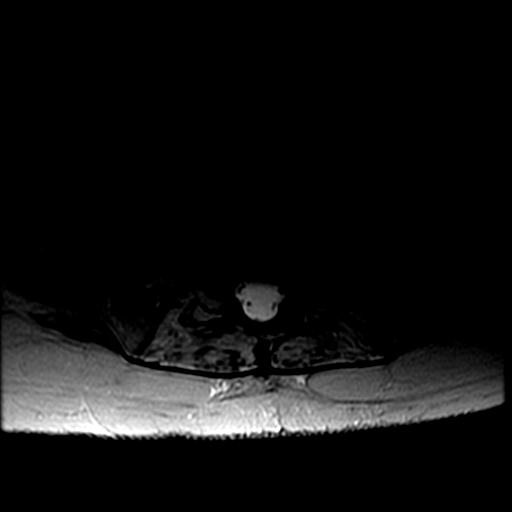
[im 7/35]
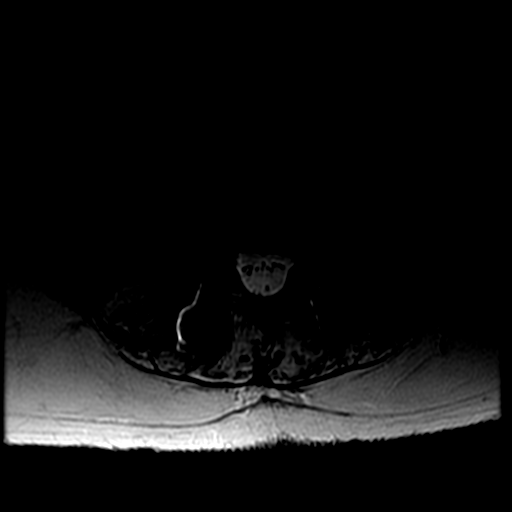
[im 10/35]
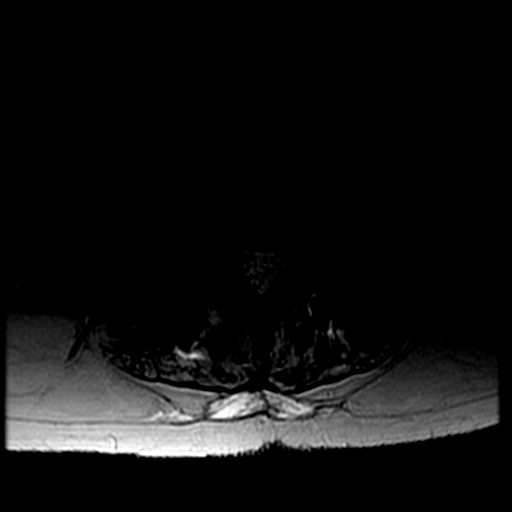
[im 16/35]
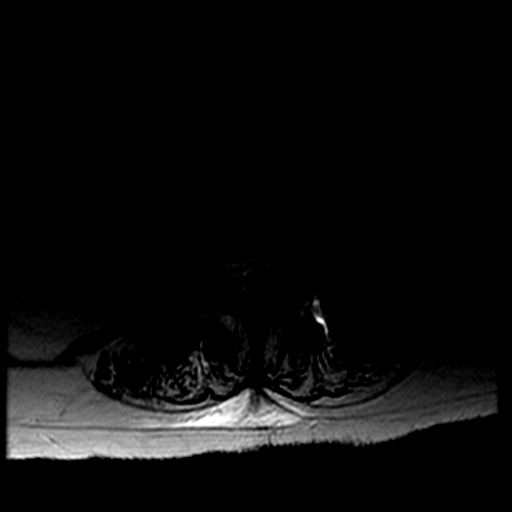
[im 19/35]
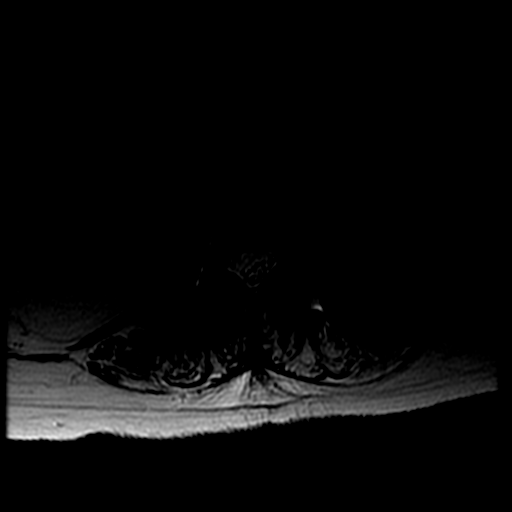
[im 25/35]
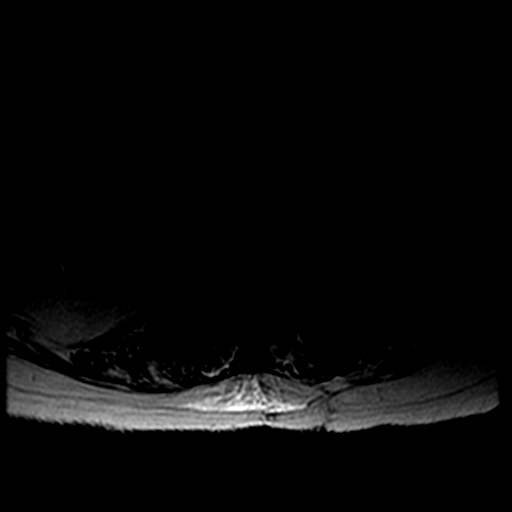
[im 28/35]
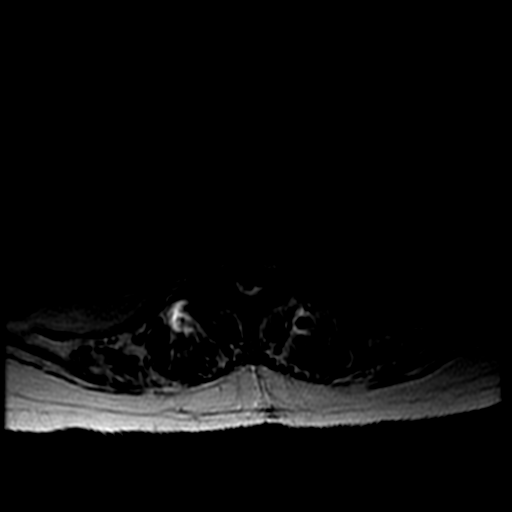
[im 31/35]
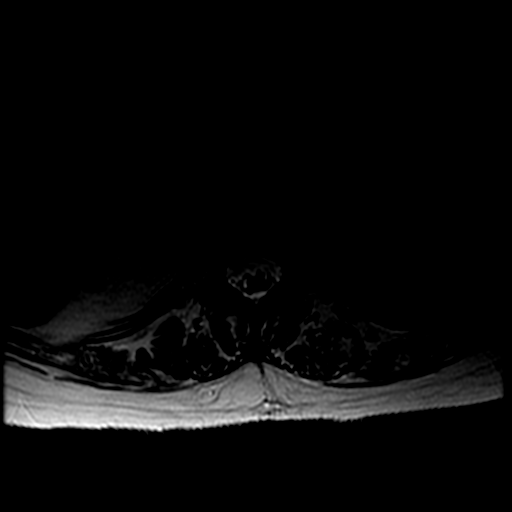
[im 35/35]
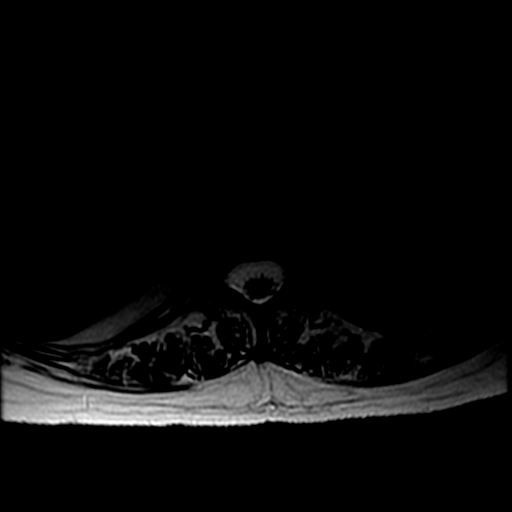

[Series 8: T1 · axial · 4.0mm · 0.39mm/px · z∈[-111,+40]mm · 3 of 35 slices shown (2 of 2)]
[im 7/35]
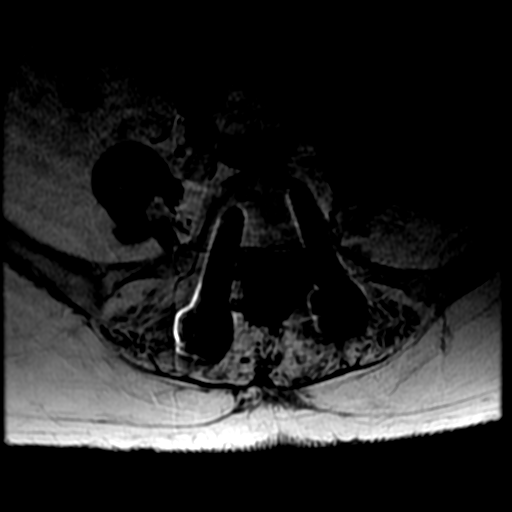
[im 19/35]
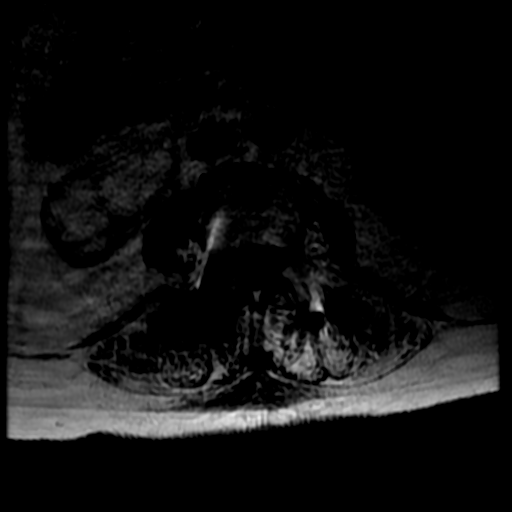
[im 31/35]
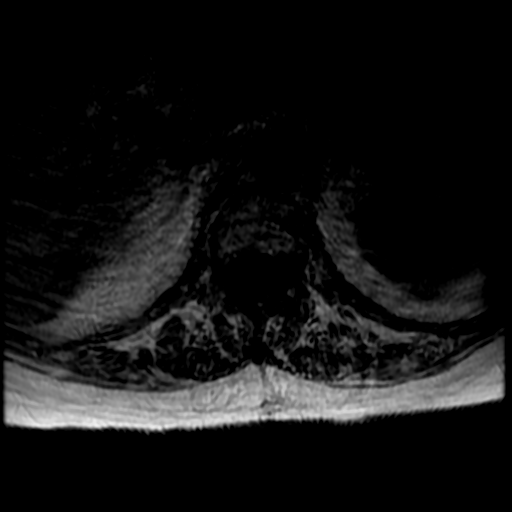

[19 of 48 positions shown; findings below may reference images not displayed]

FINDINGS: Segmentation:  Normal

Alignment:  Normal

Vertebrae: L2-5 PLIF. No acute fracture or aggressive osseous
lesion. No discitis-osteomyelitis.

Conus medullaris and cauda equina: Conus extends to the L1 level.
Conus and cauda equina appear normal.

Paraspinal and other soft tissues: Cystic atrophy of the right
kidney, unchanged.

Disc levels:

T10-11: Disc space narrowing without stenosis.

T11-12: Normal

T12-L1: Normal

L1-2: Mild disc bulge, left eccentric. No spinal canal stenosis.
Mild left foraminal stenosis.

L2-3: Posterior interbody fusion. No spinal canal or neural
foraminal stenosis.

L3-4: Posterior interbody fusion. No spinal canal or neural
foraminal stenosis.

L4-5: Posterior interbody fusion. No spinal canal or neural
foraminal stenosis.

L5-S1: Posterior decompression. Disc space narrowing with mild
endplate spurring. No spinal canal or neural foraminal stenosis.
IMPRESSION: 1. No acute fracture or static subluxation of the lumbar spine.
2. L1-2 disc bulge with mild left foraminal stenosis.
3. L2-5 PLIF in normal alignment.

## 2020-12-02 IMAGING — CT CT CERVICAL SPINE WITHOUT CONTRAST
4 of 7 series · 13 of 33 positions shown, 15 images · non-contrast
Comparison: Head CT from 0800.

CLINICAL DATA: Fell.  Hit head.

EXAM:
CT HEAD WITHOUT CONTRAST
CT CERVICAL SPINE WITHOUT CONTRAST
TECHNIQUE: Multidetector CT imaging of the head and cervical spine was
performed following the standard protocol without intravenous
contrast. Multiplanar CT image reconstructions of the cervical spine
were also generated.

[Series 6: c spine soft · axial · 0.31mm/px · z∈[-282,-246]mm · 2 of 73 slices shown]
[im 19/73  soft-tissue]
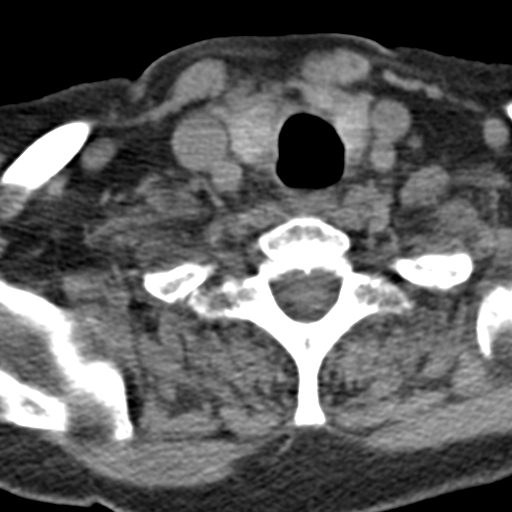
[im 37/73  soft-tissue]
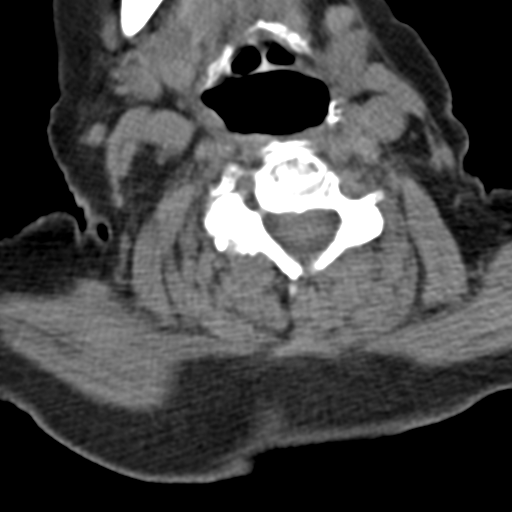

[Series 11: orthogonal bone · axial · 0.23mm/px · z∈[-319,-202]mm · 5 of 91 slices shown, 7 images]
[im 16/91  soft-tissue]
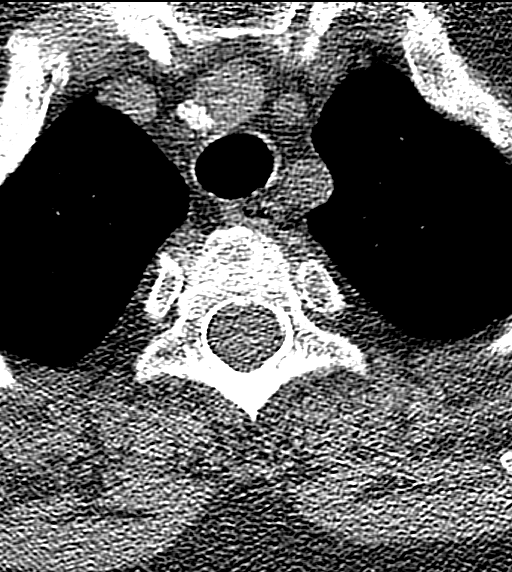
[im 16/91  bone]
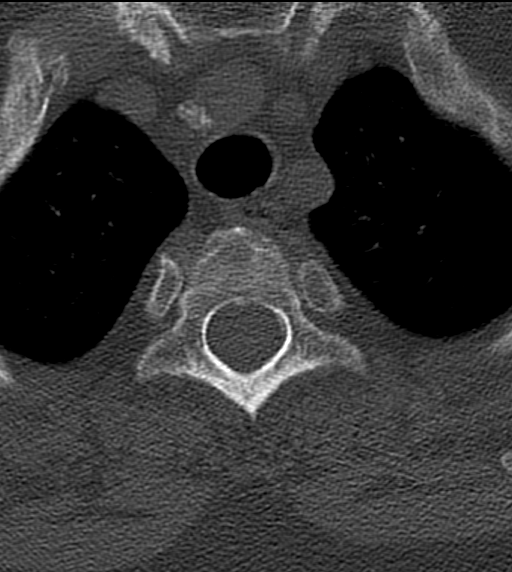
[im 31/91  bone]
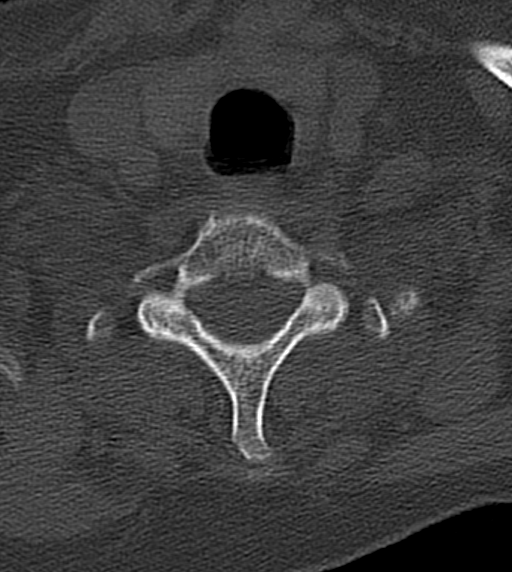
[im 46/91  bone]
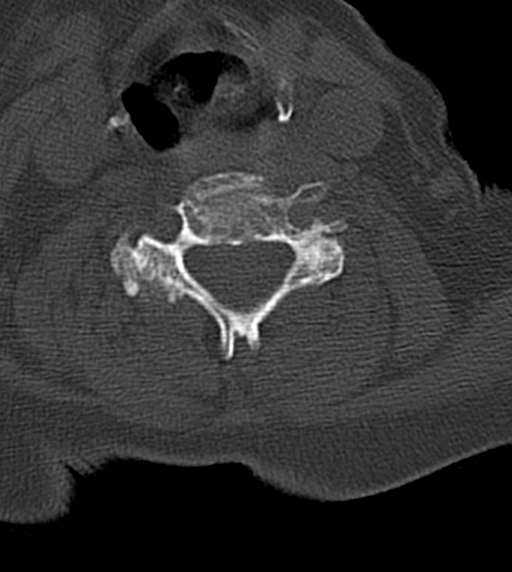
[im 61/91  bone]
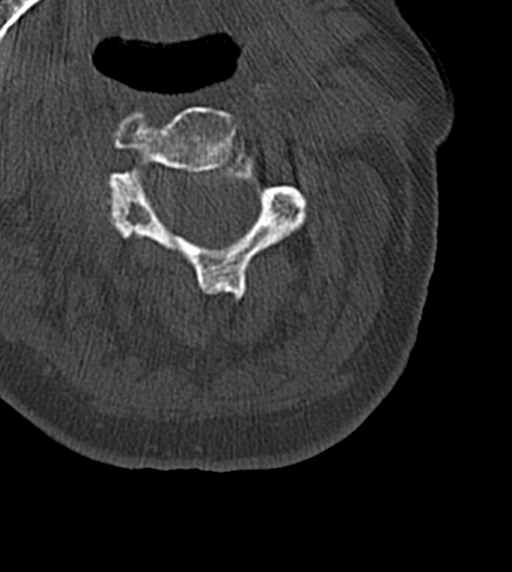
[im 76/91  soft-tissue]
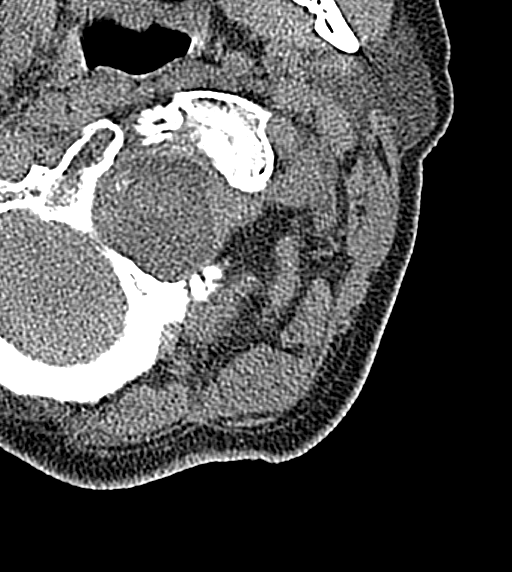
[im 76/91  bone]
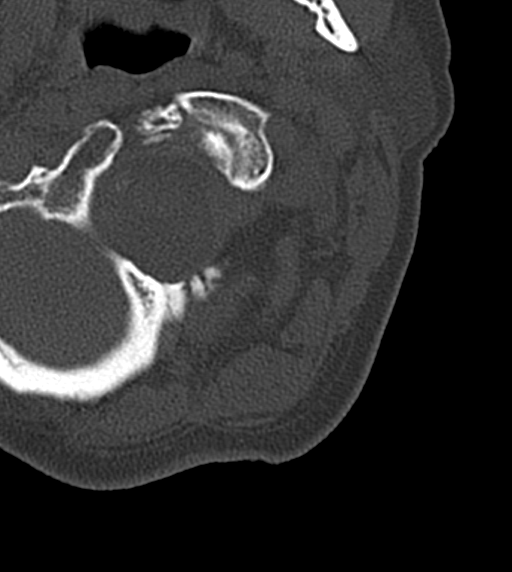

[Series 12: coronal bone · coronal · 0.23mm/px · 1 of 69 slices shown]
[im 35/69  bone]
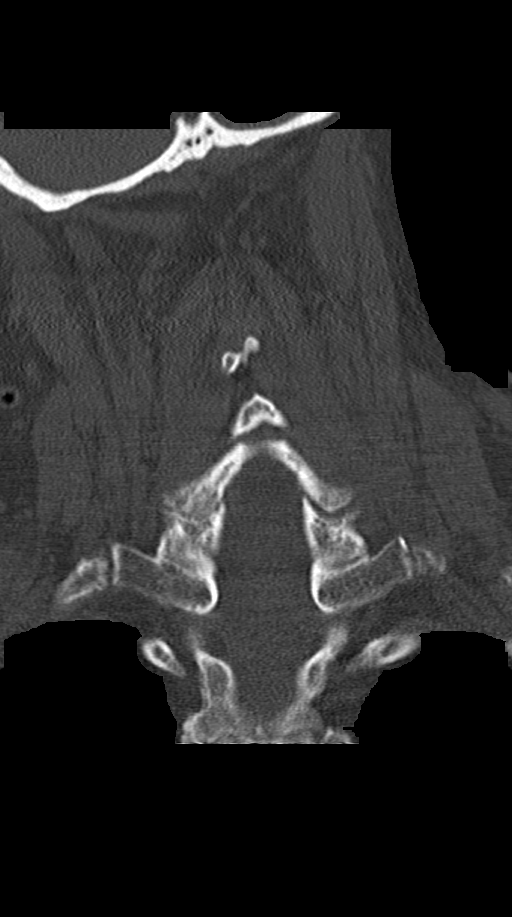

[Series 13: sagittal bone · sagittal · 0.28mm/px · 5 of 61 slices shown]
[im 11/61  bone]
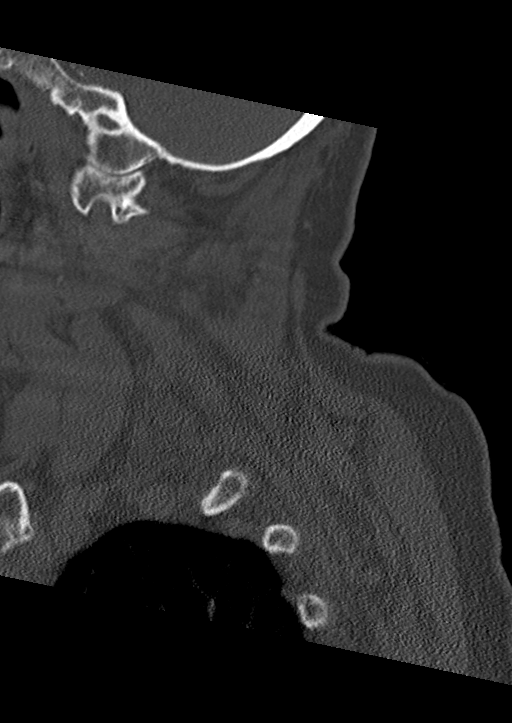
[im 21/61  bone]
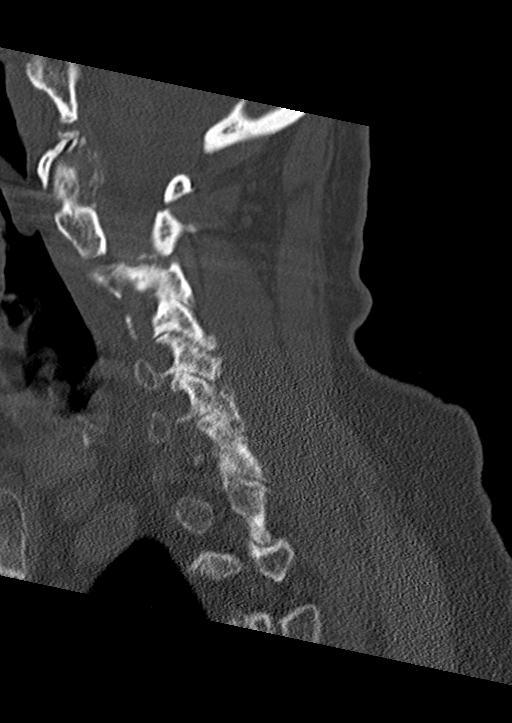
[im 31/61  bone]
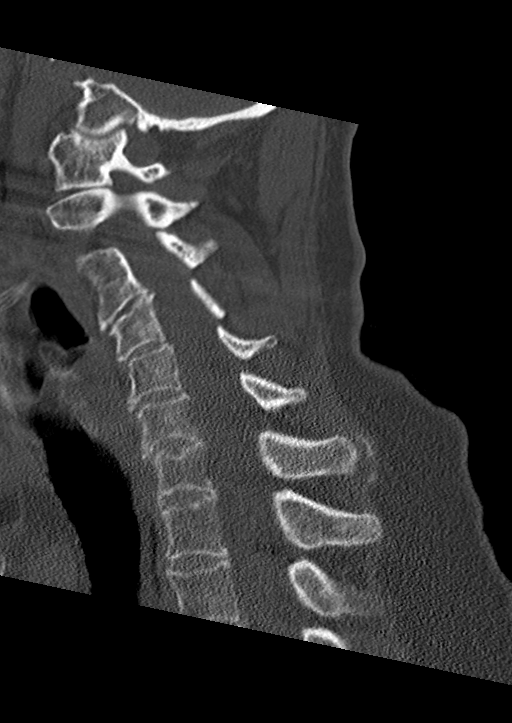
[im 41/61  bone]
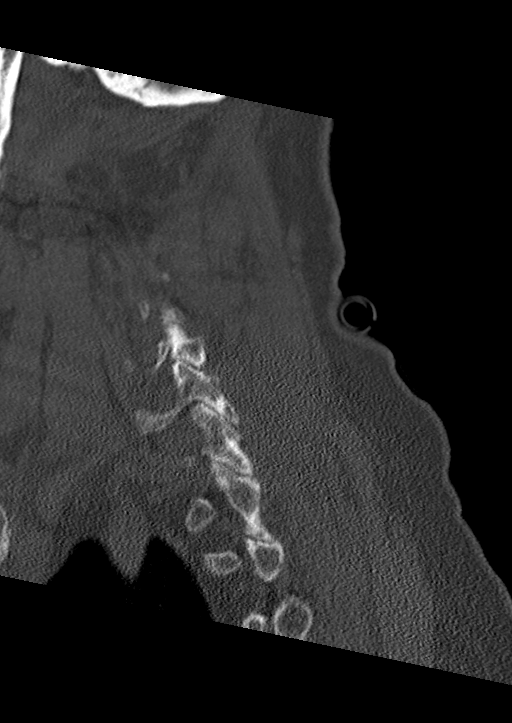
[im 51/61  bone]
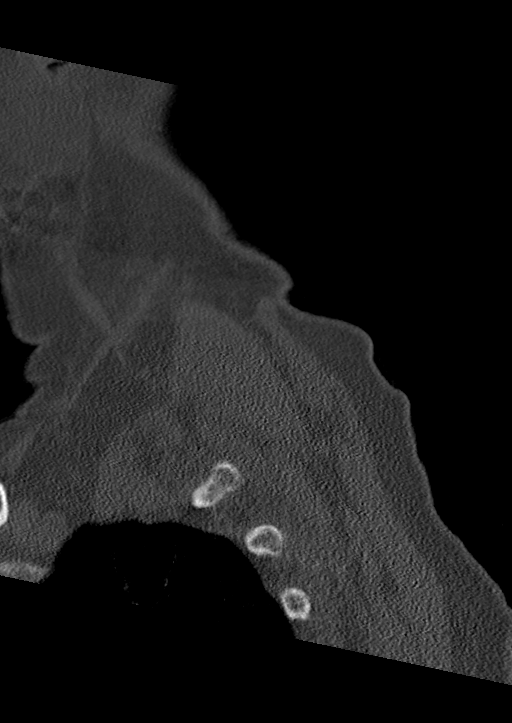

[13 of 33 positions shown; findings below may reference images not displayed]

FINDINGS: CT HEAD FINDINGS

Brain: Age related cerebral atrophy, ventriculomegaly and
periventricular white matter disease. No acute intracranial findings
such as hemispheric infarction or intracranial hemorrhage. No
extra-axial fluid collections are identified. The brainstem and
cerebellum are grossly normal.

Vascular: Vascular calcifications without definite aneurysm or
hyperdense vessels.

Skull: No acute skull fracture

Sinuses/Orbits: Scattered sinus disease. The mastoid air cells and
middle ear cavities are clear. The globes are intact.

Other: No scalp lesions.

CT CERVICAL SPINE FINDINGS

Alignment: Normal overall alignment.

Skull base and vertebrae: Degenerative cervical spondylosis with
multilevel disc disease and facet disease. There is advanced
degenerative changes at C1-2 with partially calcified pannus
formation.

No acute cervical spine fracture is identified. The facets are
normally aligned. Advanced degenerative changes but no definite
facet or laminar fractures.

Soft tissues and spinal canal: No prevertebral fluid or swelling. No
visible canal hematoma.

Disc levels: Very generous spinal canal. No significant spinal
stenosis.

Upper chest: The lung apices are grossly clear. Emphysematous
changes are noted.

Other: No neck mass or adenopathy.
IMPRESSION: 1. No acute intracranial findings or skull fracture.
2. Degenerative changes involving cervical spine but no acute
cervical spine fracture.

## 2020-12-12 ENCOUNTER — Other Ambulatory Visit: Payer: Self-pay

## 2020-12-12 ENCOUNTER — Ambulatory Visit: Payer: Medicare HMO | Attending: Nurse Practitioner | Admitting: Nurse Practitioner

## 2020-12-12 VITALS — BP 165/78 | HR 86 | Ht 64.0 in | Wt 154.6 lb

## 2020-12-12 DIAGNOSIS — I10 Essential (primary) hypertension: Secondary | ICD-10-CM

## 2020-12-12 DIAGNOSIS — G44229 Chronic tension-type headache, not intractable: Secondary | ICD-10-CM

## 2020-12-12 DIAGNOSIS — M542 Cervicalgia: Secondary | ICD-10-CM

## 2020-12-12 DIAGNOSIS — K219 Gastro-esophageal reflux disease without esophagitis: Secondary | ICD-10-CM

## 2020-12-12 DIAGNOSIS — Z7689 Persons encountering health services in other specified circumstances: Secondary | ICD-10-CM

## 2020-12-12 MED ORDER — CYCLOBENZAPRINE HCL 5 MG PO TABS: 5.0000 mg | ORAL_TABLET | Freq: Three times a day (TID) | ORAL | 1 refills | Status: DC | PRN

## 2020-12-12 MED ORDER — TOPIRAMATE 25 MG PO TABS: 25.0000 mg | ORAL_TABLET | Freq: Every day | ORAL | 3 refills | Status: DC

## 2020-12-12 MED ORDER — FAMOTIDINE 20 MG PO TABS: 20.0000 mg | ORAL_TABLET | Freq: Every day | ORAL | 6 refills | Status: DC

## 2020-12-12 NOTE — Progress Notes (Signed)
Assessment & Plan:  [name] was seen today for new patient (initial visit).  Diagnoses and all orders for this visit:  Encounter to establish care  Neck pain -     cyclobenzaprine (FLEXERIL) 5 MG tablet; Take 1 tablet (5 mg total) by mouth 3 (three) times daily as needed for muscle spasms. -     topiramate (TOPAMAX) 25 MG tablet; Take 1 tablet (25 mg total) by mouth daily. FOR MIGRAINE  Essential hypertension Continue all antihypertensives as prescribed.  Remember to bring in your blood pressure log with you for your follow up appointment.  DASH/Mediterranean Diets are healthier choices for HTN.    GERD without esophagitis -     famotidine (PEPCID) 20 MG tablet; Take 1 tablet (20 mg total) by mouth daily. INSTRUCTIONS: Avoid GERD Triggers: acidic, spicy or fried foods, caffeine, coffee, sodas,  alcohol and chocolate.    Chronic tension-type headache, not intractable -     topiramate (TOPAMAX) 25 MG tablet; Take 1 tablet (25 mg total) by mouth daily. FOR MIGRAINE   Patient has been counseled on age-appropriate routine health concerns for screening and prevention. These are reviewed and up-to-date. Referrals have been placed accordingly. Immunizations are up-to-date or declined.    Subjective:   Chief Complaint  Patient presents with   New Patient (Initial Visit)   HPI [name] 87 y.o. female presents to office today to establish care.  She has a past medical history of Anxiety and depression, Back pain, Fecal incontinence, GIB, Headache, Hydronephrosis, Hyperlipidemia, Hypertension, Knee pain, Migraine, Osteoporosis, multiple joint arthralgias, Pulmonary nodules, Seborrheic keratosis, and SVT    She resides in ALF.   Patient has been counseled on age-appropriate routine health concerns for screening and prevention. These are reviewed and up-to-date. She declines all cancer screening.  Immunizations are up-to-date or declined.      Headaches She has a history  of migraines. Takes gabapentin but states it is ineffective and causes extreme drowsiness. Headaches have been increasing over the past 2 weeks. Aggravating factors: weather and stress. She has been dealing with headaches for for over 20 years. Endorses Headache pain 7/10. She denies any injury or trauma.    HTN Blood pressure is elevated today. She is prescribed amlodipine 5 mg daily and losartan 25 mg daily however she has not been taking amlodipine. States she was not aware she had been prescribed this medication. Denies chest pain, shortness of breath, palpitations, lightheadedness, dizziness, headaches or BLE edema.   BP Readings from Last 3 Encounters:  12/12/20 (!) 165/78  11/11/20 130/70  10/24/20 (!) 158/109     Review of Systems  Constitutional:  Negative for fever, malaise/fatigue and weight loss.  HENT: Negative.  Negative for nosebleeds.   Eyes: Negative.  Negative for blurred vision, double vision and photophobia.  Respiratory: Negative.  Negative for cough and shortness of breath.   Cardiovascular: Negative.  Negative for chest pain, palpitations and leg swelling.  Gastrointestinal: Negative.  Negative for heartburn, nausea and vomiting.  Musculoskeletal:  Positive for neck pain. Negative for myalgias.  Neurological:  Positive for headaches. Negative for dizziness, focal weakness and seizures.  Psychiatric/Behavioral:  Positive for memory loss. Negative for suicidal ideas.    Past Medical History:  Diagnosis Date   Anxiety and depression    Back pain    lumbar   Fecal incontinence    Dr. Michail Sermon, likely due to loose stools in conjunction with pelvic muscle laxity from aging, recommended to stop Miralax  GI (gastrointestinal bleed)    hx of colon polyps 2004, diverticulosis, internal hemmorhids   Headache    Headache(784.0)    Hydronephrosis    Right 2008 s/p urology eval CT/cysto: observation/monitor   Hyperlipidemia    Hypertension    Knee pain    right chronic    Migraine    on topamax , per neuro   Osteoporosis    Pulmonary nodules    [place]. First seen by CT only 05/04/03, no change on f/u 08/08/06 -Macroscopic changes rml 04/27/07 > resolved april 6,2010 no further w/u    Seborrheic keratosis    Left inferior posterior flank   SVT (supraventricular tachycardia) ([place])    sees Dr.Ross    Past Surgical History:  Procedure Laterality Date   ABDOMINAL HYSTERECTOMY     APPENDECTOMY     BASAL CELL CARCINOMA EXCISION     BLADDER REPAIR     CATARACT EXTRACTION  2008   Dr.Digby   CHOLECYSTECTOMY     lumbar reconstructive surgery  01/07/2009   R ovary removed     ROTATOR CUFF REPAIR  9/-2011   SHOULDER SURGERY  12-2011   LEFT   TONSILLECTOMY     TOTAL KNEE ARTHROPLASTY     right   trigger finger surgery     L thumb (2014), R ring finger 05-2013    Family History  Problem Relation Age of Onset   Breast cancer Mother        later in life   Diabetes Mother        late in life   Heart disease Mother        late onset   Lung cancer Father        apparently had TB   Heart disease Father        late onset   Prostate cancer Father    Colitis Brother    Cancer Brother    Colon cancer Neg Hx     Social History Reviewed with no changes to be made today.   Outpatient Medications Prior to Visit  Medication Sig Dispense Refill   acetaminophen (TYLENOL) 325 MG tablet Take 650 mg by mouth every 6 (six) hours as needed.     amLODipine (NORVASC) 5 MG tablet Take 1 tablet (5 mg total) by mouth daily. NEEDS APPOINTMENT FOR FUTURE REFILL / 1st Attempt 90 tablet 0   atorvastatin (LIPITOR) 40 MG tablet TAKE 1 TABLET BY MOUTH EVERYDAY AT BEDTIME 90 tablet 1   Bismuth Subsalicylate (PEPTO-BISMOL PO) Take 1 tablet by mouth daily as needed.      Ca [id number] (CALCIUM-FOLIC ACID PLUS D PO) Take by mouth daily.     celecoxib (CELEBREX) 200 MG capsule TAKE 1 CAPSULE BY MOUTH EVERY DAY AS NEEDED FOR MODERATE PAIN 90 capsule 1   CRANBERRY PO  Take 1 capsule by mouth daily.     dextromethorphan-guaiFENesin (MUCINEX DM) 30-600 MG 12hr tablet Take 1 tablet by mouth 2 (two) times daily as needed for cough.     escitalopram (LEXAPRO) 10 MG tablet TAKE 1 TABLET BY MOUTH EVERY DAY 90 tablet 1   Liniments (BLUE-EMU SUPER STRENGTH) CREA Apply 1 application topically at bedtime as needed (dry skin).     loperamide (IMODIUM A-D) 2 MG tablet Take 2 mg by mouth 4 (four) times daily as needed for diarrhea or loose stools.     loratadine (CLARITIN) 10 MG tablet TAKE 1 TABLET BY MOUTH EVERY DAY 90 tablet 1   losartan (  COZAAR) 25 MG tablet Take 25 mg by mouth daily.     meloxicam (MOBIC) 7.5 MG tablet Take one tab daily as neede 30 tablet 2   Menthol, Topical Analgesic, (BLUE-EMU MAXIMUM STRENGTH EX) Apply topically. Apply topically as needed.     Multiple Vitamins-Minerals (CENTRUM SILVER 50+WOMEN) TABS take 1 tablet by oral route every day     NON FORMULARY Take 2 capsules by mouth 2 (two) times daily. Hydro Eye     olopatadine (PATANOL) 0.1 % ophthalmic solution Place 1 drop into both eyes 2 (two) times daily as needed for allergies.     oxyCODONE-acetaminophen (PERCOCET) 5-325 MG tablet Take 1 tablet by mouth every 8 (eight) hours as needed for severe pain. 20 tablet 0   pantoprazole (PROTONIX) 40 MG tablet TAKE 1 TABLET (40 MG TOTAL) BY MOUTH DAILY. TAKE 30-60 MIN BEFORE FIRST MEAL OF THE DAY 90 tablet 1   Phenylephrine-Acetaminophen (SINUS PRESSURE + PAIN) 5-325 MG TABS Take by mouth as directed.     Potassium Chloride ER 20 MEQ TBCR TAKE 1 TABLET BY MOUTH EVERY DAY 90 tablet 0   Probiotic Product (PROBIOTIC DAILY PO) Take 1 capsule by mouth daily.      trimethoprim (TRIMPEX) 100 MG tablet Take 100 mg by mouth daily.     amoxicillin-clavulanate (AUGMENTIN) 875-125 MG tablet Take 1 tablet by mouth once. Prior to Dentist appts.     cyclobenzaprine (FLEXERIL) 10 MG tablet TAKE 1 TABLET BY MOUTH THREE TIMES A DAY AS NEEDED FOR MUSCLE SPASMS 30 tablet  2   famotidine (PEPCID) 20 MG tablet Take 1 tablet (20 mg total) by mouth daily. 30 tablet 6   gabapentin (NEURONTIN) 100 MG capsule TAKE 2-3 CAPSULES (200-300 MG TOTAL) BY MOUTH 3 (THREE) TIMES DAILY. (Patient taking differently: Take 300 mg by mouth 3 (three) times daily.) 810 capsule 1   No facility-administered medications prior to visit.    Allergies  Allergen Reactions   Amitriptyline Hcl     REACTION: hallucinations   Carisoprodol     REACTION: nightmares,rash,tachycardia   Codeine     REACTION: "deathly ill" nauesa, dizzy,weak   Fosamax [Alendronate Sodium] Other (See Comments)    Dental issues   Meloxicam     REACTION: sensitive to sun; facial redness; may cause severe abd pain   Metronidazole     REACTION: abd pain   Nortriptyline Hcl     REACTION: hallucinations   Oxaprozin     REACTION: abd pain   Promethazine Hcl     REACTION: spastic arms and legs flailing while awake and unusual behavior while sleep - walking all over house during night   Propranolol Other (See Comments)    hallucinations   Rofecoxib     REACTION: rash   Sumatriptan     REACTION: abdominal pain, blurry vision, drowsiness   Tramadol Other (See Comments)    Dizziness and constipation   Venlafaxine     Hallucinations        Objective:    BP (!) 165/78   Pulse 86   Ht '5\' 4"'$  (1.626 m)   Wt 154 lb 9.6 oz (70.1 kg)   SpO2 97%   BMI 26.54 kg/m  Wt Readings from Last 3 Encounters:  12/12/20 154 lb 9.6 oz (70.1 kg)  11/11/20 154 lb 6.4 oz (70 kg)  10/23/20 155 lb (70.3 kg)    Physical Exam Vitals and nursing note reviewed.  Constitutional:      Appearance: She is well-developed.  HENT:     Head: Normocephalic and atraumatic.  Cardiovascular:     Rate and Rhythm: Normal rate and regular rhythm.     Heart sounds: Normal heart sounds. No murmur heard.   No friction rub. No gallop.  Pulmonary:     Effort: Pulmonary effort is normal. No tachypnea or respiratory distress.     Breath  sounds: Normal breath sounds. No decreased breath sounds, wheezing, rhonchi or rales.  Chest:     Chest wall: No tenderness.  Abdominal:     General: Bowel sounds are normal.     Palpations: Abdomen is soft.  Musculoskeletal:        General: Normal range of motion.     Cervical back: Normal range of motion.  Skin:    General: Skin is warm and dry.  Neurological:     Mental Status: She is alert and oriented to person, place, and time.     Coordination: Coordination normal.  Psychiatric:        Behavior: Behavior normal. Behavior is cooperative.        Thought Content: Thought content normal.        Cognition and Memory: She exhibits impaired recent memory.        Judgment: Judgment normal.         Patient has been counseled extensively about nutrition and exercise as well as the importance of adherence with medications and regular follow-up. The patient was given clear instructions to go to ER or return to medical center if symptoms don't improve, worsen or new problems develop. The patient verbalized understanding.   Follow-up: Return for 2 week bp check luke. Phone visit with me in 3 weeks  810 or 130 for headaches.   Gildardo Pounds, FNP-BC [facility] and [facility] [place], [place]   12/13/2020, 8:47 PM

## 2020-12-12 NOTE — Patient Instructions (Signed)
Take GABAPENTIN ONCE A NIGHT.   TAKE CYCLOBENZAPRINE 5 MG 2-3 times a day as needed for NECK PAIN  TAKE TOPAMAX FOR MIGRAINES once a day

## 2020-12-13 ENCOUNTER — Encounter: Payer: Self-pay | Admitting: Nurse Practitioner

## 2020-12-13 MED ORDER — GABAPENTIN 300 MG PO CAPS: 300.0000 mg | ORAL_CAPSULE | Freq: Every day | ORAL | 1 refills | Status: DC

## 2020-12-19 ENCOUNTER — Other Ambulatory Visit: Payer: Self-pay | Admitting: Internal Medicine

## 2020-12-30 ENCOUNTER — Ambulatory Visit: Payer: Medicare HMO | Attending: Nurse Practitioner | Admitting: Nurse Practitioner

## 2020-12-30 ENCOUNTER — Other Ambulatory Visit: Payer: Self-pay

## 2020-12-30 ENCOUNTER — Telehealth: Payer: Self-pay | Admitting: Nurse Practitioner

## 2020-12-30 NOTE — Telephone Encounter (Signed)
[phone] (Home Phone) [place]  [phone] (Mobile) Unable to LVM. Mailbox full

## 2021-01-06 ENCOUNTER — Ambulatory Visit: Payer: Medicare HMO | Admitting: Pharmacist

## 2021-01-15 ENCOUNTER — Other Ambulatory Visit: Payer: Self-pay | Admitting: *Deleted

## 2021-01-15 DIAGNOSIS — K219 Gastro-esophageal reflux disease without esophagitis: Secondary | ICD-10-CM

## 2021-01-15 MED ORDER — FAMOTIDINE 20 MG PO TABS: 20.0000 mg | ORAL_TABLET | Freq: Every day | ORAL | 1 refills | Status: DC

## 2021-01-15 MED ORDER — POTASSIUM CHLORIDE ER 20 MEQ PO TBCR: 1.0000 | EXTENDED_RELEASE_TABLET | Freq: Every day | ORAL | 1 refills | Status: DC

## 2021-01-15 MED ORDER — ESCITALOPRAM OXALATE 10 MG PO TABS: 10.0000 mg | ORAL_TABLET | Freq: Every day | ORAL | 1 refills | Status: DC

## 2021-01-22 ENCOUNTER — Other Ambulatory Visit: Payer: Self-pay | Admitting: Internal Medicine

## 2021-01-31 ENCOUNTER — Other Ambulatory Visit: Payer: Self-pay | Admitting: Internal Medicine

## 2021-03-10 ENCOUNTER — Telehealth (INDEPENDENT_AMBULATORY_CARE_PROVIDER_SITE_OTHER): Payer: Medicare HMO | Admitting: Neurology

## 2021-03-10 ENCOUNTER — Encounter: Payer: Self-pay | Admitting: Neurology

## 2021-03-10 ENCOUNTER — Other Ambulatory Visit: Payer: Self-pay

## 2021-03-10 VITALS — Ht 68.0 in | Wt 160.0 lb

## 2021-03-10 DIAGNOSIS — G3184 Mild cognitive impairment, so stated: Secondary | ICD-10-CM

## 2021-03-10 NOTE — Progress Notes (Signed)
Virtual Visit via Video Note The purpose of this virtual visit is to provide medical care while limiting exposure to the novel coronavirus.    Consent was obtained for video visit:  Yes.   Answered questions that patient had about telehealth interaction:  Yes.   I discussed the limitations, risks, security and privacy concerns of performing an evaluation and management service by telemedicine. I also discussed with the patient that there may be a patient responsible charge related to this service. The patient expressed understanding and agreed to proceed.  Pt location: Home Physician Location: office Name of referring provider:  Isaac Bliss, [place]* I connected with [name] at patients initiation/request on 03/10/2021 at 10:00 AM EST by video enabled telemedicine application and verified that I am speaking with the correct person using two identifiers. Pt MRN:  [id number] Pt DOB:  1929-05-04 Video Participants:  [name]; Chantal Press photographer at [employer])   History of Present Illness:  The patient had a virtual video visit on 03/10/2021. She was last seen 8 months ago for Mild Cognitive Impairment. Neuropsychological evaluation in 2021 indicated Mild Cognitive Impairment, with very mild dimunition in measures of language function and one memory test, barely reaching the threshold of abnormality, no indication of a neurodegenerative process  She is accompanied by Jone Baseman, Glass blower/designer at [employer]. Since her last visit, she reports doing well. She continues to live in [place]. Chantal denies any concerns. She feels her memory is okay, "not 100," she forgets names. She continues to manage her own medications and finances and denies any issues. Sometimes staff helps her fill her dispenser. She does not drive. She is independent with dressing and bathing. She continues to report headaches since her fall and takes Topiramate every  morning. She also reports taking gabapentin three times a day for chronic pain. Sleep is interrupted by urinary frequency (goes three times at night), she naps anytime she sits down. She denies any falls, notes she is awaiting scheduling for left knee surgery.    History on Initial Assessment 11/13/2019: This is a pleasant 87 year old right-handed woman with a history of hypertension, hyperlipidemia, SVT, migraines, anxiety, depression, presenting for evaluation of memory loss. She lives at [facility] and has been going to [facility] for her memory and falls and feels better. She states her memory is not as good as she wishes. She started noticing memory changes around 5 years ago, she would forget where her fridge was while she was in the kitchen. She continues to manage her medications and finances and denies any issues. She states her son wanted her to turn it over to him but she refused, "he disclaimed me and is not calling me anymore." She does not have a car. She reports he would not take her to the closing of her home and he told some of her doctors one day that he could say she was failing and he needed to take charge of her health, then told the managers at [facility] that he was through with her. She moved to [place] in December 2020. She has a sister-in-law and nephew who keep an eye on her and call her every few days. Her niece in [place] comes once a year, they talk on the phone and has told her she could not remember some things. She reports troubling hallucinations, but she figured out herself that it was the Propranolol and after stopping it several months ago, the hallucinations  stopped. She was having auditory>visual hallucinations, hearing people in the living room, she thought she had been in a meeting with some of them. One night she needed to use the bathroom and saw 2 women sitting on a bench at the foot of her bed talking to each other. There is a family history of  dementia in her younger deceased brother, maternal aunt, and cousin.   She has occasional migraines. She has left hip pain. She fell in 2019 and continues to have pain in the back of her head and neck. She denies any dizziness, diplopia, dysarthria/dysphagia, focal numbness/tingling/weakness, bowel/bladder dysfunction, anosmia, or tremors. She has done PT at 3 different places, working on her balance. She uses her walker or cane most of the time, but comes today with no assistive devices. No recent falls.   I personally reviewed head CT without contrast done 09/2018 with no acute changes, there was age-related diffuse atrophy and chronic microvascular disease.   Laboratory Data: Lab Results  Component Value Date   TSH 2.15 09/27/2019   Lab Results  Component Value Date   VITAMINB12 256 11/23/2018     Current Outpatient Medications on File Prior to Visit  Medication Sig Dispense Refill   acetaminophen (TYLENOL) 325 MG tablet Take 650 mg by mouth every 6 (six) hours as needed.     atorvastatin (LIPITOR) 40 MG tablet TAKE 1 TABLET BY MOUTH EVERYDAY AT BEDTIME 90 tablet 1   Bismuth Subsalicylate (PEPTO-BISMOL PO) Take 1 tablet by mouth daily as needed.      dextromethorphan-guaiFENesin (MUCINEX DM) 30-600 MG 12hr tablet Take 1 tablet by mouth 2 (two) times daily as needed for cough.     escitalopram (LEXAPRO) 10 MG tablet Take 1 tablet (10 mg total) by mouth daily. 90 tablet 1   gabapentin (NEURONTIN) 300 MG capsule Take 1 capsule (300 mg total) by mouth at bedtime. 90 capsule 1   Liniments (BLUE-EMU SUPER STRENGTH) CREA Apply 1 application topically at bedtime as needed (dry skin).     losartan (COZAAR) 50 MG tablet Take 50 mg by mouth daily.     Menthol, Topical Analgesic, (BLUE-EMU MAXIMUM STRENGTH EX) Apply topically. Apply topically as needed.     Multiple Vitamins-Minerals (CENTRUM SILVER 50+WOMEN) TABS take 1 tablet by oral route every day     NON FORMULARY Take 2 capsules by mouth 2  (two) times daily. Hydro Eye     olopatadine (PATANOL) 0.1 % ophthalmic solution Place 1 drop into both eyes 2 (two) times daily as needed for allergies.     pantoprazole (PROTONIX) 40 MG tablet TAKE 1 TABLET (40 MG TOTAL) BY MOUTH DAILY. TAKE 30-60 MIN BEFORE FIRST MEAL OF THE DAY 90 tablet 1   Phenylephrine-Acetaminophen (SINUS PRESSURE + PAIN) 5-325 MG TABS Take by mouth as directed.     topiramate (TOPAMAX) 25 MG tablet Take 1 tablet (25 mg total) by mouth daily. FOR MIGRAINE 30 tablet 3   amLODipine (NORVASC) 5 MG tablet Take 1 tablet (5 mg total) by mouth daily. NEEDS APPOINTMENT FOR FUTURE REFILL / 1st Attempt (Patient not taking: Reported on 03/10/2021) 90 tablet 0   amoxicillin (AMOXIL) 500 MG capsule Take 500 mg by mouth as needed. 4 caps before going to the dent     Ca [id number] (CALCIUM-FOLIC ACID PLUS D PO) Take by mouth daily. (Patient not taking: Reported on 03/10/2021)     celecoxib (CELEBREX) 200 MG capsule TAKE 1 CAPSULE BY MOUTH EVERY DAY AS NEEDED FOR MODERATE PAIN (  Patient not taking: Reported on 03/10/2021) 30 capsule 5   CRANBERRY PO Take 1 capsule by mouth daily. (Patient not taking: Reported on 03/10/2021)     cyclobenzaprine (FLEXERIL) 5 MG tablet Take 1 tablet (5 mg total) by mouth 3 (three) times daily as needed for muscle spasms. (Patient not taking: Reported on 03/10/2021) 60 tablet 1   loperamide (IMODIUM A-D) 2 MG tablet Take 2 mg by mouth 4 (four) times daily as needed for diarrhea or loose stools. (Patient not taking: Reported on 03/10/2021)     loratadine (CLARITIN) 10 MG tablet TAKE 1 TABLET BY MOUTH EVERY DAY (Patient not taking: Reported on 03/10/2021) 90 tablet 1   meloxicam (MOBIC) 7.5 MG tablet Take one tab daily as neede (Patient not taking: Reported on 03/10/2021) 30 tablet 2   oxyCODONE-acetaminophen (PERCOCET) 5-325 MG tablet Take 1 tablet by mouth every 8 (eight) hours as needed for severe pain. (Patient not taking: Reported on 03/10/2021)  20 tablet 0   Potassium Chloride ER 20 MEQ TBCR Take 1 tablet by mouth daily. (Patient not taking: Reported on 03/10/2021) 90 tablet 1   Probiotic Product (PROBIOTIC DAILY PO) Take 1 capsule by mouth daily.  (Patient not taking: Reported on 03/10/2021)     trimethoprim (TRIMPEX) 100 MG tablet Take 100 mg by mouth daily. (Patient not taking: Reported on 03/10/2021)     No current facility-administered medications on file prior to visit.     Observations/Objective:   Vitals:   03/10/21 0959  Weight: 160 lb (72.6 kg)  Height: 5\' 8"  (1.727 m)   GEN:  The patient appears stated age and is in NAD.  Neurological examination: Patient is awake, alert. No aphasia or dysarthria. Intact fluency and comprehension. Cranial nerves: Extraocular movements intact with no nystagmus. No facial asymmetry. Motor: moves all extremities symmetrically, at least anti-gravity x 4.    Assessment and Plan:   This is a pleasant 87 yo RH woman with a history of hypertension, hyperlipidemia, SVT, migraines, anxiety, depression, who presented for memory loss. Neuropsychological evaluation indicated Mild Cognitive Impairment, no indication of a neurodegenerative process/dementia. She is overall doing well in Independent living, managing complex tasks. We again discussed the importance of control of vascular risk factors, physical exercise, brain stimulation exercises for overall brain health. Continue close supervision, staff denies any concerns. Continue follow-up with PCP, follow-up as needed in our office, she knows to call for any changes.    Follow Up Instructions:   -I discussed the assessment and treatment plan with the patient. The patient was provided an opportunity to ask questions and all were answered. The patient agreed with the plan and demonstrated an understanding of the instructions.   The patient was advised to call back or seek an in-person evaluation if the symptoms worsen or if the condition fails to  improve as anticipated.    Cameron Sprang, MD

## 2021-03-10 NOTE — Patient Instructions (Signed)
Good to see you! Follow-up as needed, call for any changes.   FALL PRECAUTIONS: Be cautious when walking. Scan the area for obstacles that may increase the risk of trips and falls. When getting up in the mornings, sit up at the edge of the bed for a few minutes before getting out of bed. Consider elevating the bed at the head end to avoid drop of blood pressure when getting up. Walk always in a well-lit room (use night lights in the walls). Avoid area rugs or power cords from appliances in the middle of the walkways. Use a walker or a cane if necessary and consider physical therapy for balance exercise. Get your eyesight checked regularly.   RECOMMENDATIONS FOR ALL PATIENTS WITH MEMORY PROBLEMS: 1. Continue to exercise (Recommend 30 minutes of walking everyday, or 3 hours every week) 2. Increase social interactions - continue going to [place] and enjoy social gatherings with friends and family 3. Eat healthy, avoid fried foods and eat more fruits and vegetables 4. Maintain adequate blood pressure, blood sugar, and blood cholesterol level. Reducing the risk of stroke and cardiovascular disease also helps promoting better memory. 5. Avoid stressful situations. Live a simple life and avoid aggravations. Organize your time and prepare for the next day in anticipation. 6. Sleep well, avoid any interruptions of sleep and avoid any distractions in the bedroom that may interfere with adequate sleep quality 7. Avoid sugar, avoid sweets as there is a strong link between excessive sugar intake, diabetes, and cognitive impairment The Mediterranean diet has been shown to help patients reduce the risk of progressive memory disorders and reduces cardiovascular risk. This includes eating fish, eat fruits and green leafy vegetables, nuts like almonds and hazelnuts, walnuts, and also use olive oil. Avoid fast foods and fried foods as much as possible. Avoid sweets and sugar as sugar use has been linked to worsening of  memory function.

## 2021-03-23 ENCOUNTER — Other Ambulatory Visit: Payer: Self-pay | Admitting: Nurse Practitioner

## 2021-03-23 DIAGNOSIS — G44229 Chronic tension-type headache, not intractable: Secondary | ICD-10-CM

## 2021-03-23 DIAGNOSIS — M542 Cervicalgia: Secondary | ICD-10-CM

## 2021-04-24 ENCOUNTER — Other Ambulatory Visit: Payer: Self-pay | Admitting: Internal Medicine

## 2021-05-01 ENCOUNTER — Emergency Department (HOSPITAL_COMMUNITY)
Admission: EM | Admit: 2021-05-01 | Discharge: 2021-05-02 | Disposition: A | Discharge status: Home / Self Care | Payer: Medicare PPO | Attending: Emergency Medicine | Admitting: Emergency Medicine

## 2021-05-01 ENCOUNTER — Emergency Department (HOSPITAL_COMMUNITY): Payer: Medicare PPO

## 2021-05-01 ENCOUNTER — Encounter (HOSPITAL_COMMUNITY): Payer: Self-pay | Admitting: *Deleted

## 2021-05-01 DIAGNOSIS — Z79899 Other long term (current) drug therapy: Secondary | ICD-10-CM | POA: Diagnosis not present

## 2021-05-01 DIAGNOSIS — I10 Essential (primary) hypertension: Secondary | ICD-10-CM | POA: Insufficient documentation

## 2021-05-01 DIAGNOSIS — R519 Headache, unspecified: Secondary | ICD-10-CM | POA: Diagnosis present

## 2021-05-01 DIAGNOSIS — G4489 Other headache syndrome: Secondary | ICD-10-CM | POA: Diagnosis not present

## 2021-05-01 LAB — CBC
HCT: 38.7 % (ref 36.0–46.0)
Hemoglobin: 12.7 g/dL (ref 12.0–15.0)
MCH: 32.1 pg (ref 26.0–34.0)
MCHC: 32.8 g/dL (ref 30.0–36.0)
MCV: 97.7 fL (ref 80.0–100.0)
Platelets: 297 10*3/uL (ref 150–400)
RBC: 3.96 MIL/uL (ref 3.87–5.11)
RDW: 14.1 % (ref 11.5–15.5)
WBC: 8.9 10*3/uL (ref 4.0–10.5)
nRBC: 0 % (ref 0.0–0.2)

## 2021-05-01 LAB — BASIC METABOLIC PANEL
Anion gap: 8 (ref 5–15)
BUN: 12 mg/dL (ref 8–23)
CO2: 22 mmol/L (ref 22–32)
Calcium: 9.5 mg/dL (ref 8.9–10.3)
Chloride: 109 mmol/L (ref 98–111)
Creatinine, Ser: 0.76 mg/dL (ref 0.44–1.00)
GFR, Estimated: >60 mL/min (ref >60)
Glucose, Bld: 99 mg/dL (ref 70–99)
Potassium: 3.9 mmol/L (ref 3.5–5.1)
Sodium: 139 mmol/L (ref 135–145)

## 2021-05-01 MED ORDER — ACETAMINOPHEN 325 MG PO TABS
650.0000 mg | ORAL_TABLET | Freq: Once | ORAL | Status: AC
  Administered 2021-05-02: 650 mg via ORAL
  Filled 2021-05-01: qty 2

## 2021-05-01 NOTE — ED Provider Triage Note (Signed)
Emergency Medicine Provider Triage Evaluation Note  [name] , a 88 y.o. female  was evaluated in triage.  Pt complains of acute worsening of her chronic headache.  She reports she has had this headache now for 2 years and since yesterday its been worse.  It is in the same location as it usually is.  She denies visual change, chest pain, shortness of breath, abdominal pain, or other complaints.  Reports history of irregular heartbeat, but per chart review SVT is listed.  Review of Systems  Positive: As above Negative: As above  Physical Exam  BP (!) 210/88 (BP Location: Left Arm)    Pulse 68    Temp 98.4 F (36.9 C) (Oral)    Resp 20    SpO2 96%  Gen:   Awake, no distress   Resp:  Normal effort  MSK:   Moves extremities without difficulty  Other:    Medical Decision Making  Medically screening exam initiated at 11:31 AM.  Appropriate orders placed.  Sharyl Panchal was informed that the remainder of the evaluation will be completed by another provider, this initial triage assessment does not replace that evaluation, and the importance of remaining in the ED until their evaluation is complete.     Evlyn Courier, PA-C 05/01/21 1134

## 2021-05-01 NOTE — ED Triage Notes (Signed)
Pt arrived by [facility] from [facility], has hx of headaches since injury 2 years ago. Pain was worse today and no relief with tylenol.denies dizziness or n/v.

## 2021-05-02 MED ORDER — ACETAMINOPHEN 325 MG PO TABS
ORAL_TABLET | ORAL | Status: AC
  Administered 2021-05-02: 650 mg via ORAL
  Filled 2021-05-02: qty 2

## 2021-05-02 MED ORDER — DIPHENHYDRAMINE HCL 50 MG/ML IJ SOLN: 12.5000 mg | Freq: Once | INTRAMUSCULAR | Status: DC

## 2021-05-02 MED ORDER — METOCLOPRAMIDE HCL 5 MG/ML IJ SOLN: 5.0000 mg | Freq: Once | INTRAMUSCULAR | Status: DC

## 2021-05-02 NOTE — ED Notes (Signed)
Patient walked to [facility] and sat self in wheelchair demanding to be driven home. Pt aox4, states the hospital has kept her "prisoner"

## 2021-05-02 NOTE — Discharge Instructions (Signed)

## 2021-05-02 NOTE — ED Notes (Signed)
Pt refusing IV and IV medications

## 2021-05-02 NOTE — ED Provider Notes (Signed)
[facility] EMERGENCY DEPARTMENT Provider Note   CSN: [id number] Arrival date & time: 05/01/21  1045     History  Chief Complaint  Patient presents with   Headache    [name] is a 88 y.o. female.  The history is provided by the patient.  Headache Pain location:  Occipital Onset quality:  Gradual Timing:  Intermittent Progression:  Unchanged Chronicity:  Chronic Similar to prior headaches: yes   Relieved by:  Nothing Worsened by:  Nothing Associated symptoms: no fever, no numbness and no weakness   Patient with history of hypertension mild cognitive impairment presents with headache.  Patient reports she has had intermittent headaches for up to 2 years since a head injury.  She reports at times it feels like there is "buzzing"in her head  No new weakness.  No new visual changes.  Denies any recent fall the past 24 hours    Home Medications Prior to Admission medications   Medication Sig Start Date End Date Taking? Authorizing Provider  acetaminophen (TYLENOL) 325 MG tablet Take 650 mg by mouth every 6 (six) hours as needed.    [provider]  amLODipine (NORVASC) 5 MG tablet Take 1 tablet (5 mg total) by mouth daily. NEEDS APPOINTMENT FOR FUTURE REFILL / 1st Attempt Patient not taking: Reported on 03/10/2021 04/28/20   Richardo Priest, MD  amoxicillin (AMOXIL) 500 MG capsule Take 500 mg by mouth as needed. 4 caps before going to the dent    [provider]  atorvastatin (LIPITOR) 40 MG tablet TAKE 1 TABLET BY MOUTH EVERYDAY AT BEDTIME 10/23/20   Isaac Bliss, Rayford Halsted, MD  Bismuth Subsalicylate (PEPTO-BISMOL PO) Take 1 tablet by mouth daily as needed.     [provider]  Ca [id number] (CALCIUM-FOLIC ACID PLUS D PO) Take by mouth daily. Patient not taking: Reported on 03/10/2021    [provider]  celecoxib (CELEBREX) 200 MG capsule TAKE 1 CAPSULE BY MOUTH EVERY DAY AS NEEDED FOR MODERATE  PAIN Patient not taking: Reported on 03/10/2021 12/19/20   Isaac Bliss, Rayford Halsted, MD  CRANBERRY PO Take 1 capsule by mouth daily. Patient not taking: Reported on 03/10/2021    [provider]  cyclobenzaprine (FLEXERIL) 5 MG tablet Take 1 tablet (5 mg total) by mouth 3 (three) times daily as needed for muscle spasms. Patient not taking: Reported on 03/10/2021 12/12/20   Gildardo Pounds, NP  dextromethorphan-guaiFENesin [facility] DM) 30-600 MG 12hr tablet Take 1 tablet by mouth 2 (two) times daily as needed for cough.    [provider]  escitalopram (LEXAPRO) 10 MG tablet Take 1 tablet (10 mg total) by mouth daily. 01/15/21   Isaac Bliss, Rayford Halsted, MD  gabapentin (NEURONTIN) 300 MG capsule Take 1 capsule (300 mg total) by mouth at bedtime. Patient taking differently: Take 300 mg by mouth at bedtime. Taking TID 12/13/20 03/13/21  Gildardo Pounds, NP  Liniments (BLUE-EMU SUPER STRENGTH) CREA Apply 1 application topically at bedtime as needed (dry skin).    [provider]  loperamide (IMODIUM A-D) 2 MG tablet Take 2 mg by mouth 4 (four) times daily as needed for diarrhea or loose stools. Patient not taking: Reported on 03/10/2021    [provider]  loratadine (CLARITIN) 10 MG tablet TAKE 1 TABLET BY MOUTH EVERY DAY Patient not taking: Reported on 03/10/2021 02/13/20   Tanda Rockers, MD  losartan (COZAAR) 50 MG tablet Take 50 mg by mouth daily.  [provider]  meloxicam (MOBIC) 7.5 MG tablet Take one tab daily as neede Patient not taking: Reported on 03/10/2021 03/25/20   Isaac Bliss, Rayford Halsted, MD  Menthol, Topical Analgesic, (BLUE-EMU MAXIMUM STRENGTH EX) Apply topically. Apply topically as needed.    [provider]  Multiple Vitamins-Minerals (CENTRUM SILVER 50+WOMEN) TABS take 1 tablet by oral route every day    [provider]  NON FORMULARY Take 2 capsules by mouth 2 (two) times daily. [facility]    [provider]  olopatadine (PATANOL) 0.1 % ophthalmic solution Place 1 drop into both eyes 2 (two) times daily as needed for allergies. 10/05/18   [provider]  oxyCODONE-acetaminophen (PERCOCET) 5-325 MG tablet Take 1 tablet by mouth every 8 (eight) hours as needed for severe pain. Patient not taking: Reported on 03/10/2021 10/23/20   Milton Ferguson, MD  pantoprazole (PROTONIX) 40 MG tablet TAKE 1 TABLET (40 MG TOTAL) BY MOUTH DAILY. TAKE 30-60 MIN BEFORE FIRST MEAL OF THE DAY 11/04/20   Tanda Rockers, MD  Phenylephrine-Acetaminophen (SINUS PRESSURE + PAIN) 5-325 MG TABS Take by mouth as directed.    [provider]  Potassium Chloride ER 20 MEQ TBCR Take 1 tablet by mouth daily. Patient not taking: Reported on 03/10/2021 01/15/21   Isaac Bliss, Rayford Halsted, MD  Probiotic Product (PROBIOTIC DAILY PO) Take 1 capsule by mouth daily.  Patient not taking: Reported on 03/10/2021    [provider]  topiramate (TOPAMAX) 25 MG tablet Take 1 tablet (25 mg total) by mouth daily. FOR MIGRAINE 12/12/20 03/10/21  Gildardo Pounds, NP  trimethoprim (TRIMPEX) 100 MG tablet Take 100 mg by mouth daily. Patient not taking: Reported on 03/10/2021    [provider]      Allergies    Amitriptyline hcl, Carisoprodol, Codeine, Fosamax [alendronate sodium], Meloxicam, Metronidazole, Nortriptyline hcl, Oxaprozin, Promethazine hcl, Propranolol, Rofecoxib, Sumatriptan, Tramadol, and Venlafaxine    Review of Systems   Review of Systems  Constitutional:  Negative for fever.  Eyes:        Denies any new visual changes  Neurological:  Positive for headaches. Negative for weakness and numbness.  All other systems reviewed and are negative.  Physical Exam Updated Vital Signs BP (!) 184/85    Pulse 67    Temp 98.4 F (36.9 C) (Oral)    Resp 18    SpO2 97%  Physical Exam CONSTITUTIONAL: Elderly, no acute distress HEAD: Normocephalic/atraumatic, no signs of trauma, no bruising,  no fluctuant EYES: EOMI/PERRL, no nystagmus, no ptosis, no corneal haziness ENMT: Mucous membranes moist NECK: supple no meningeal signs, no bruits SPINE/BACK:entire spine nontender CV: S1/S2 noted, no murmurs/rubs/gallops noted LUNGS: Lungs are clear to auscultation bilaterally, no apparent distress ABDOMEN: soft NEURO:Awake/alert, face symmetric, no arm or leg drift is noted Equal 5/5 strength with hip flexion,knee flex/extension, foot dorsi/plantar flexion Cranial nerves 3/4/5/6/10/25/08/11/12 tested and intact Gait is slow but no ataxia Sensation to light touch intact in all extremities EXTREMITIES: pulses normal, full ROM SKIN: warm, color normal PSYCH: no abnormalities of mood noted, alert and oriented to situation  ED Results / Procedures / Treatments   Labs (all labs ordered are listed, but only abnormal results are displayed) Labs Reviewed  CBC  BASIC METABOLIC PANEL    EKG None  Radiology CT Head Wo Contrast  Result Date: 05/01/2021 CLINICAL DATA:  Headache.  Posttraumatic headache. EXAM: CT HEAD WITHOUT CONTRAST TECHNIQUE: Contiguous axial images were obtained from the base of the  skull through the vertex without intravenous contrast. RADIATION DOSE REDUCTION: This exam was performed according to the departmental dose-optimization program which includes automated exposure control, adjustment of the mA and/or kV according to patient size and/or use of iterative reconstruction technique. COMPARISON:  10/22/2020 FINDINGS: Brain: No acute intracranial hemorrhage. No focal mass lesion. No CT evidence of acute infarction. No midline shift or mass effect. No hydrocephalus. Basilar cisterns are patent. There are periventricular and subcortical white matter hypodensities. Generalized cortical atrophy. Vascular: No hyperdense vessel or unexpected calcification. Skull: Normal. Negative for fracture or focal lesion. Sinuses/Orbits: Paranasal sinuses and mastoid air cells are clear. Orbits  are clear. Other: None. IMPRESSION: 1. No acute intracranial findings.  No change from prior. 2. Atrophy and white matter microvascular disease. Electronically Signed   By: Suzy Bouchard M.D.   On: 05/01/2021 12:22    Procedures Procedures    Medications Ordered in ED Medications  acetaminophen (TYLENOL) tablet 650 mg (650 mg Oral Given 05/02/21 0314)    ED Course/ Medical Decision Making/ A&P                           Medical Decision Making  This patient presents to the ED for concern of headache, this involves an extensive number of treatment options, and is a complaint that carries with it a high risk of complications and morbidity.  The differential diagnosis includes high-grade, stroke, intracranial hemorrhage, brain tumor, temporal arteritis, subarachnoid hemorrhage  Comorbidities that complicate the patient evaluation: Patients presentation is complicated by their history of cognitive impairment    Additional history obtained: Additional history obtained from nursing home/care facility Records reviewed previous neurology notes reviewed  Lab Tests: I Ordered, and personally interpreted labs.  The pertinent results include: No acute finding  Imaging Studies ordered: I ordered imaging studies including CT scan head I independently visualized and interpreted imaging which showed no acute findings I agree with the radiologist interpretation   Medicines ordered and prescription drug management: I ordered medication including Tylenol for headache Reevaluation of the patient after these medicines showed that the patient    improved   Reevaluation: After the interventions noted above, I reevaluated the patient and found that they have :improved  Complexity of problems addressed: Patients presentation is most consistent with  exacerbation of chronic illness      Disposition: After consideration of the diagnostic results and the patients response to treatment,  I  feel that the patent would benefit from discharge .   Patient presents with headache that she has had intermittently for 2 years due to a previous head injury. This appears to have been documented previously by neurology.  No acute neurodeficits here.  She can ambulate with a slow steady gait.  CT head was reviewed and negative.  Low suspicion for SAH/meningitis or other acute neurologic emergency.  No new visual changes to suggest temporal arteritis Discussed with our nursing facility, they report after physical therapy she had elevated blood pressure and was told to go to ER. No signs of hypertensive emergency at this time        Final Clinical Impression(s) / ED Diagnoses Final diagnoses:  Other headache syndrome    Rx / DC Orders ED Discharge Orders     None         Ripley Fraise, MD 05/02/21 [phone]

## 2021-05-03 ENCOUNTER — Other Ambulatory Visit: Payer: Self-pay | Admitting: Internal Medicine

## 2021-05-07 ENCOUNTER — Ambulatory Visit: Payer: Medicare PPO | Admitting: Physician Assistant

## 2021-05-07 ENCOUNTER — Other Ambulatory Visit: Payer: Self-pay

## 2021-05-07 ENCOUNTER — Encounter: Payer: Self-pay | Admitting: Physician Assistant

## 2021-05-07 ENCOUNTER — Ambulatory Visit: Payer: Medicare HMO | Admitting: Family

## 2021-05-07 VITALS — BP 150/80 | HR 78 | Resp 20 | Ht 64.0 in | Wt 151.0 lb

## 2021-05-07 DIAGNOSIS — G44021 Chronic cluster headache, intractable: Secondary | ICD-10-CM | POA: Diagnosis not present

## 2021-05-07 DIAGNOSIS — G3184 Mild cognitive impairment, so stated: Secondary | ICD-10-CM

## 2021-05-07 MED ORDER — TIZANIDINE HCL 2 MG PO CAPS: 2.0000 mg | ORAL_CAPSULE | Freq: Every day | ORAL | 3 refills | Status: DC

## 2021-05-07 NOTE — Patient Instructions (Addendum)
Good to see you! Follow-up with Dr. Tomi Likens in first opening  months for headaches  Follow up 2 month  Start tizanidine 2 mg at night after 1 week you can increase it to 2 mg twice a day  Discontinue cyclobenzaprine (flexeril) Discontinue toparimate (topamax )  Continue the gabapentin dose  Follow up with primary doctor to monitor your blood pressure       FALL PRECAUTIONS: Be cautious when walking. Scan the area for obstacles that may increase the risk of trips and falls. When getting up in the mornings, sit up at the edge of the bed for a few minutes before getting out of bed. Consider elevating the bed at the head end to avoid drop of blood pressure when getting up. Walk always in a well-lit room (use night lights in the walls). Avoid area rugs or power cords from appliances in the middle of the walkways. Use a walker or a cane if necessary and consider physical therapy for balance exercise. Get your eyesight checked regularly.   RECOMMENDATIONS FOR ALL PATIENTS WITH MEMORY PROBLEMS: 1. Continue to exercise (Recommend 30 minutes of walking everyday, or 3 hours every week) 2. Increase social interactions - continue going to [place] and enjoy social gatherings with friends and family 3. Eat healthy, avoid fried foods and eat more fruits and vegetables 4. Maintain adequate blood pressure, blood sugar, and blood cholesterol level. Reducing the risk of stroke and cardiovascular disease also helps promoting better memory. 5. Avoid stressful situations. Live a simple life and avoid aggravations. Organize your time and prepare for the next day in anticipation. 6. Sleep well, avoid any interruptions of sleep and avoid any distractions in the bedroom that may interfere with adequate sleep quality 7. Avoid sugar, avoid sweets as there is a strong link between excessive sugar intake, diabetes, and cognitive impairment The Mediterranean diet has been shown to help patients reduce the risk of progressive  memory disorders and reduces cardiovascular risk. This includes eating fish, eat fruits and green leafy vegetables, nuts like almonds and hazelnuts, walnuts, and also use olive oil. Avoid fast foods and fried foods as much as possible. Avoid sweets and sugar as sugar use has been linked to worsening of memory function.

## 2021-05-07 NOTE — Progress Notes (Signed)
Assessment/Plan:   Mild cognitive impairment   Recommendations:  Discussed safety both in and out of the home.  Discussed the importance of regular daily schedule with inclusion of crossword puzzles to maintain brain function.  Continue to monitor mood by PCP Stay active at least 30 minutes at least 3 times a week.  Naps should be scheduled and should be no longer than 60 minutes and should not occur after 2 PM.  Mediterranean diet is recommended  Control cardiovascular risk factors  Follow up as needed  Headaches  Start tizanidine 2 mg at night after 1 week you can increase it to 2 mg twice a day  Discontinue cyclobenzaprine (flexeril) Discontinue toparimate (topamax )  Continue the gabapentin dose  Follow up with primary doctor to monitor your blood pressure  Follow-up in 2 months, and in 6 months with Dr. Tomi Likens for headache management   Case discussed with Dr. Tomi Likens who agrees with the plan     Subjective:    [name] is a very pleasant 88 y.o. RH female  seen today in follow up for mild cognitive impairment. This patient is accompanied in the office by her who supplements the history.  Previous records as well as any outside records available were reviewed prior to todays visit.  Patient was last seen via video visit on 03/10/2021. Last SLUMS on 7/27/21was 24/30.   The patient is here complaining about headaches.  She is not interested in pursuing any work-up regarding her memory.  She states that this memory issues are about the same, and "I accepted them, as part of my age ".  She continues to live at [facility]  She also declines any questions regarding her memory.  "I thought I was coming here to look at my headache ".  Apparently, she was in the emergency department on 05/02/2021 with possible changes in her blood pressure taken at her independent living facility.  She does report chronic headaches for 2 years since her head injury, and takes  topiramate every morning.  At the ED, she had a negative work-up, including low suspicion for [facility] meningitis, or temporal arteritis.  There were no signs of hypertensive emergency at the ER.  She takes gabapentin 3 times a day for chronic pain.  She denies any recent falls or head injuries. Her headaches are constant, occipital, "relieved by nothing and nothing makes it worse ".  She denies any stroke symptoms accompanying this headaches.  She denies any vision changes or aura.  Denies any nausea or vomiting.  Denies any history of seizures.  She was on Topamax 25 mg nightly, and cyclobenzaprine..  She is also on gabapentin.     History on Initial Assessment 11/13/2019: This is a pleasant 88 year old right-handed woman with a history of hypertension, hyperlipidemia, SVT, migraines, anxiety, depression, presenting for evaluation of memory loss. She lives at [facility] and has been going to [facility] for her memory and falls and feels better. She states her memory is not as good as she wishes. She started noticing memory changes around 5 years ago, she would forget where her fridge was while she was in the kitchen. She continues to manage her medications and finances and denies any issues. She states her son wanted her to turn it over to him but she refused, "he disclaimed me and is not calling me anymore." She does not have a car. She reports he would not take her to the closing of her  home and he told some of her doctors one day that he could say she was failing and he needed to take charge of her health, then told the managers at [facility] that he was through with her. She moved to [place] in December 2020. She has a sister-in-law and nephew who keep an eye on her and call her every few days. Her niece in [place] comes once a year, they talk on the phone and has told her she could not remember some things. She reports troubling hallucinations, but she figured out herself that it was the  Propranolol and after stopping it several months ago, the hallucinations stopped. She was having auditory>visual hallucinations, hearing people in the living room, she thought she had been in a meeting with some of them. One night she needed to use the bathroom and saw 2 women sitting on a bench at the foot of her bed talking to each other. There is a family history of dementia in her younger deceased brother, maternal aunt, and cousin.    She has occasional migraines. She has left hip pain. She fell in 2019 and continues to have pain in the back of her head and neck. She denies any dizziness, diplopia, dysarthria/dysphagia, focal numbness/tingling/weakness, bowel/bladder dysfunction, anosmia, or tremors. She has done PT at 3 different places, working on her balance. She uses her walker or cane most of the time, but comes today with no assistive devices. No recent falls.    I personally reviewed head CT without contrast done 09/2018 with no acute changes, there was age-related diffuse atrophy and chronic microvascular disease.     Laboratory Data:      Lab Results  Component Value Date    TSH 2.15 09/27/2019         Lab Results  Component Value Date    VITAMINB12 256 11/23/2018    PREVIOUS MEDICATIONS:   CURRENT MEDICATIONS:  Outpatient Encounter Medications as of 05/07/2021  Medication Sig   acetaminophen (TYLENOL) 325 MG tablet Take 650 mg by mouth every 6 (six) hours as needed.   amLODipine (NORVASC) 5 MG tablet Take 1 tablet (5 mg total) by mouth daily. NEEDS APPOINTMENT FOR FUTURE REFILL / 1st Attempt   atorvastatin (LIPITOR) 40 MG tablet TAKE 1 TABLET BY MOUTH EVERYDAY AT BEDTIME   Bismuth Subsalicylate (PEPTO-BISMOL PO) Take 1 tablet by mouth daily as needed.    Ca [id number] (CALCIUM-FOLIC ACID PLUS D PO) Take by mouth daily.   celecoxib (CELEBREX) 200 MG capsule TAKE 1 CAPSULE BY MOUTH EVERY DAY AS NEEDED FOR MODERATE PAIN   CRANBERRY PO Take 1 capsule by mouth  daily.   cyclobenzaprine (FLEXERIL) 5 MG tablet Take 1 tablet (5 mg total) by mouth 3 (three) times daily as needed for muscle spasms.   dextromethorphan-guaiFENesin (MUCINEX DM) 30-600 MG 12hr tablet Take 1 tablet by mouth 2 (two) times daily as needed for cough.   escitalopram (LEXAPRO) 10 MG tablet Take 1 tablet (10 mg total) by mouth daily.   gabapentin (NEURONTIN) 100 MG capsule Take 300 mg by mouth 3 (three) times daily.   Liniments (BLUE-EMU SUPER STRENGTH) CREA Apply 1 application topically at bedtime as needed (dry skin).   loperamide (IMODIUM A-D) 2 MG tablet Take 2 mg by mouth 4 (four) times daily as needed for diarrhea or loose stools.   loratadine (CLARITIN) 10 MG tablet TAKE 1 TABLET BY MOUTH EVERY DAY   losartan (COZAAR) 25 MG tablet Take 25 mg by mouth daily.  losartan (COZAAR) 50 MG tablet Take 50 mg by mouth daily.   meloxicam (MOBIC) 7.5 MG tablet Take one tab daily as neede   Menthol, Topical Analgesic, (BLUE-EMU MAXIMUM STRENGTH EX) Apply topically. Apply topically as needed.   Multiple Vitamins-Minerals (CENTRUM SILVER 50+WOMEN) TABS take 1 tablet by oral route every day   NON FORMULARY Take 2 capsules by mouth 2 (two) times daily. Hydro Eye   olopatadine (PATANOL) 0.1 % ophthalmic solution Place 1 drop into both eyes 2 (two) times daily as needed for allergies.   oxyCODONE-acetaminophen (PERCOCET) 5-325 MG tablet Take 1 tablet by mouth every 8 (eight) hours as needed for severe pain.   pantoprazole (PROTONIX) 40 MG tablet TAKE 1 TABLET (40 MG TOTAL) BY MOUTH DAILY. TAKE 30-60 MIN BEFORE FIRST MEAL OF THE DAY   Phenylephrine-Acetaminophen (SINUS PRESSURE + PAIN) 5-325 MG TABS Take by mouth as directed.   Potassium Chloride ER 20 MEQ TBCR Take 1 tablet by mouth daily.   Probiotic Product (PROBIOTIC DAILY PO) Take 1 capsule by mouth daily.   trimethoprim (TRIMPEX) 100 MG tablet Take 100 mg by mouth daily.   amoxicillin (AMOXIL) 500 MG capsule Take 500 mg by mouth as needed.  4 caps before going to the dent (Patient not taking: Reported on 05/07/2021)   gabapentin (NEURONTIN) 300 MG capsule Take 1 capsule (300 mg total) by mouth at bedtime. (Patient taking differently: Take 300 mg by mouth at bedtime. Taking TID)   topiramate (TOPAMAX) 25 MG tablet Take 1 tablet (25 mg total) by mouth daily. FOR MIGRAINE   No facility-administered encounter medications on file as of 05/07/2021.     Objective:     PHYSICAL EXAMINATION:    VITALS:   Vitals:   05/07/21 1100  BP: (!) 150/80  Pulse: 78  Resp: 20  SpO2: 100%  Weight: 151 lb (68.5 kg)  Height: 5\' 4"  (1.626 m)    GEN:  The patient appears stated age and is in NAD. HEENT:  Normocephalic, atraumatic.   Neurological examination:  General: NAD, well-groomed, appears stated age.  Anxious appearing Orientation: The patient is alert. Oriented to person, place and date Cranial nerves: There is good facial symmetry.The speech is fluent and clear. No aphasia or dysarthria. Fund of knowledge is appropriate. Recent and remote memory are impaired. Attention and concentration are normal.  Able to name objects and repeat phrases.  Hearing is intact to conversational tone.    Sensation: Sensation is intact to light touch throughout Motor: Strength is at least antigravity x4.  Uses a walker to ambulate Tremors: none  DTR's 2/4 in UE/LE    No flowsheet data found. MMSE - Mini Mental State Exam 11/23/2018 12/06/2017 06/03/2016  Orientation to time 4 5 5   Orientation to Place 5 5 5   Registration 3 3 3   Attention/ Calculation 5 5 5   Recall 3 3 3   Language- name 2 objects 2 2 2   Language- repeat 1 1 1   Language- follow 3 step command 3 3 3   Language- read & follow direction 1 1 1   Write a sentence 1 1 1   Copy design 1 1 1   Total score 29 30 30     [employer] Mental Exam 11/13/2019  Weekday Correct 1  Current year 1  What state are we in? 1  Amount spent 1  Amount left 2  # of Animals 3  5 objects recall 3   Number series 0  Hour markers 2  Time correct 2  Placed  X in triangle correctly 1  Largest Figure 1  Name of female 2  Date back to work 2  Type of work 0  State she lived in 2  Total score 24       Movement examination: Tone: There is normal tone in the UE/LE Abnormal movements:  no tremor.  No myoclonus.  No asterixis.   Coordination:  There is no decremation with RAM's. Normal finger to nose  Gait and Station: The patient has no difficulty arising out of a deep-seated chair without the use of the hands. The patient's stride length is good.  Gait is cautious and narrow.        Total time spent on today's visit was 30  minutes, including both face-to-face time and nonface-to-face time. Time included that spent on review of records (prior notes available to me/labs/imaging if pertinent), discussing treatment and goals, answering patient's questions and coordinating care.  Cc:  Buzzy Han, MD Sharene Butters, PA-C

## 2021-05-11 ENCOUNTER — Other Ambulatory Visit: Payer: Self-pay | Admitting: Nurse Practitioner

## 2021-05-11 DIAGNOSIS — G44229 Chronic tension-type headache, not intractable: Secondary | ICD-10-CM

## 2021-05-11 DIAGNOSIS — M542 Cervicalgia: Secondary | ICD-10-CM

## 2021-05-11 NOTE — Telephone Encounter (Signed)
Requested medication (s) are due for refill today:No  Requested medication (s) are on the active medication list:NO  Last refill:12/12/20    Future visit scheduled No  Notes to clinic:   Not delegated, discontinued 05/07/21, change in therapy. Per protocol, cannot refuse non-delegated meds.  Requested Prescriptions  Pending Prescriptions Disp Refills   topiramate (TOPAMAX) 25 MG tablet [Pharmacy Med Name: TOPIRAMATE 25 MG TABLET] 90 tablet 1    Sig: Take 1 tablet (25 mg total) by mouth daily. FOR MIGRAINE     Not Delegated - Neurology: Anticonvulsants - topiramate & zonisamide Failed - 05/11/2021  9:41 AM      Failed - This refill cannot be delegated      Passed - Cr in normal range and within 360 days    Creatinine, Ser  Date Value Ref Range Status  05/01/2021 0.76 0.44 - 1.00 mg/dL Final   Creatinine,U  Date Value Ref Range Status  05/10/2016 68.7 mg/dL Final          Passed - CO2 in normal range and within 360 days    CO2  Date Value Ref Range Status  05/01/2021 22 22 - 32 mmol/L Final          Passed - Valid encounter within last 12 months    Recent Outpatient Visits           5 months ago Encounter to establish care   [place], Vernia Buff, NP

## 2021-05-14 ENCOUNTER — Other Ambulatory Visit: Payer: Self-pay | Admitting: Internal Medicine

## 2021-05-15 ENCOUNTER — Telehealth: Payer: Self-pay | Admitting: Physician Assistant

## 2021-05-15 NOTE — Telephone Encounter (Signed)
Patient seems a little confused about her medication she is to take.  Sharen Counter took her off topamax and flexeril and put her on tiazandine 2mg .

## 2021-05-18 NOTE — Telephone Encounter (Signed)
Pt called she cant remember why she called she stated that is taken her medication Clarise Cruz told her to take the tizanidine

## 2021-06-06 ENCOUNTER — Other Ambulatory Visit: Payer: Self-pay | Admitting: Physician Assistant

## 2021-06-28 NOTE — Progress Notes (Incomplete)
Assessment/Plan:   Mild cognitive impairment   Recommendations:  Discussed safety both in and out of the home.  Discussed the importance of regular daily schedule with inclusion of crossword puzzles to maintain brain function.  Continue to monitor mood by PCP Stay active at least 30 minutes at least 3 times a week.  Naps should be scheduled and should be no longer than 60 minutes and should not occur after 2 PM.  Mediterranean diet is recommended  Control cardiovascular risk factors  Follow up as needed  Headaches  Continue tizanidine 2 mg twice a day  Discontinue cyclobenzaprine (flexeril) Discontinue toparimate (topamax )  Continue the gabapentin dose  Follow up with primary doctor to monitor your blood pressure  Follow-up in 6 months with Dr. Tomi Likens for headache management   Case discussed with Dr. Tomi Likens who agrees with the plan     Subjective:    [name] is a very pleasant 88 y.o. RH female  seen today in follow up for mild cognitive impairment. This patient is accompanied in the office by her who supplements the history.  Previous records as well as any outside records available were reviewed prior to todays visit.  Patient was last seen on 05/07/21. Last SLUMS on 11/13/19 was 24/30.    The patient is here complaining about headaches.  She is not interested in pursuing any work-up regarding her memory.  She states that this memory issues are about the same, and "I accepted them, as part of my age ".  She continues to live at [facility].  She also declines any questions regarding her memory.  "I thought I was coming here to look at my headache ".  Apparently, she was in the emergency department on 05/02/2021 with possible changes in her blood pressure taken at her independent living facility.  She does report chronic headaches for 2 years since her head injury, and takes topiramate every morning.  At the ED, she had a negative work-up, including low  suspicion for [facility] meningitis, or temporal arteritis.  There were no signs of hypertensive emergency at the ER.  She takes gabapentin 3 times a day for chronic pain.  She denies any recent falls or head injuries. Her headaches are constant, occipital, "relieved by nothing and nothing makes it worse ".  She denies any stroke symptoms accompanying this headaches.  She denies any vision changes or aura.  Denies any nausea or vomiting.  Denies any history of seizures.  She was on Topamax 25 mg nightly, and cyclobenzaprine..  She is also on gabapentin.     History on Initial Assessment 11/13/2019: This is a pleasant 88 year old right-handed woman with a history of hypertension, hyperlipidemia, SVT, migraines, anxiety, depression, presenting for evaluation of memory loss. She lives at [facility] and has been going to [facility] for her memory and falls and feels better. She states her memory is not as good as she wishes. She started noticing memory changes around 5 years ago, she would forget where her fridge was while she was in the kitchen. She continues to manage her medications and finances and denies any issues. She states her son wanted her to turn it over to him but she refused, "he disclaimed me and is not calling me anymore." She does not have a car. She reports he would not take her to the closing of her home and he told some of her doctors one day that he could say she was failing  and he needed to take charge of her health, then told the managers at [facility] that he was through with her. She moved to [place] in December 2020. She has a sister-in-law and nephew who keep an eye on her and call her every few days. Her niece in [place] comes once a year, they talk on the phone and has told her she could not remember some things. She reports troubling hallucinations, but she figured out herself that it was the Propranolol and after stopping it several months ago, the hallucinations stopped.  She was having auditory>visual hallucinations, hearing people in the living room, she thought she had been in a meeting with some of them. One night she needed to use the bathroom and saw 2 women sitting on a bench at the foot of her bed talking to each other. There is a family history of dementia in her younger deceased brother, maternal aunt, and cousin.    She has occasional migraines. She has left hip pain. She fell in 2019 and continues to have pain in the back of her head and neck. She denies any dizziness, diplopia, dysarthria/dysphagia, focal numbness/tingling/weakness, bowel/bladder dysfunction, anosmia, or tremors. She has done PT at 3 different places, working on her balance. She uses her walker or cane most of the time, but comes today with no assistive devices. No recent falls.    I personally reviewed head CT without contrast done 09/2018 with no acute changes, there was age-related diffuse atrophy and chronic microvascular disease.     Laboratory Data:      Lab Results  Component Value Date    TSH 2.15 09/27/2019         Lab Results  Component Value Date    VITAMINB12 256 11/23/2018    PREVIOUS MEDICATIONS:   CURRENT MEDICATIONS:  Outpatient Encounter Medications as of 06/29/2021  Medication Sig   tiZANidine (ZANAFLEX) 2 MG tablet TAKE 1 CAPSULE BY MOUTH AT BEDTIME.   acetaminophen (TYLENOL) 325 MG tablet Take 650 mg by mouth every 6 (six) hours as needed.   amLODipine (NORVASC) 5 MG tablet Take 1 tablet (5 mg total) by mouth daily. NEEDS APPOINTMENT FOR FUTURE REFILL / 1st Attempt   amoxicillin (AMOXIL) 500 MG capsule Take 500 mg by mouth as needed. 4 caps before going to the dent (Patient not taking: Reported on 05/07/2021)   atorvastatin (LIPITOR) 40 MG tablet TAKE 1 TABLET BY MOUTH EVERYDAY AT BEDTIME   Bismuth Subsalicylate (PEPTO-BISMOL PO) Take 1 tablet by mouth daily as needed.    Ca [id number] (CALCIUM-FOLIC ACID PLUS D PO) Take by mouth daily.    celecoxib (CELEBREX) 200 MG capsule TAKE 1 CAPSULE BY MOUTH EVERY DAY AS NEEDED FOR MODERATE PAIN   CRANBERRY PO Take 1 capsule by mouth daily.   dextromethorphan-guaiFENesin (MUCINEX DM) 30-600 MG 12hr tablet Take 1 tablet by mouth 2 (two) times daily as needed for cough.   escitalopram (LEXAPRO) 10 MG tablet Take 1 tablet (10 mg total) by mouth daily.   gabapentin (NEURONTIN) 100 MG capsule Take 300 mg by mouth 3 (three) times daily.   gabapentin (NEURONTIN) 300 MG capsule Take 1 capsule (300 mg total) by mouth at bedtime. (Patient taking differently: Take 300 mg by mouth at bedtime. Taking TID)   Liniments (BLUE-EMU SUPER STRENGTH) CREA Apply 1 application topically at bedtime as needed (dry skin).   loperamide (IMODIUM A-D) 2 MG tablet Take 2 mg by mouth 4 (four) times daily as needed for diarrhea or  loose stools.   loratadine (CLARITIN) 10 MG tablet TAKE 1 TABLET BY MOUTH EVERY DAY   losartan (COZAAR) 25 MG tablet Take 25 mg by mouth daily.   losartan (COZAAR) 50 MG tablet Take 50 mg by mouth daily.   meloxicam (MOBIC) 7.5 MG tablet Take one tab daily as neede   Menthol, Topical Analgesic, (BLUE-EMU MAXIMUM STRENGTH EX) Apply topically. Apply topically as needed.   Multiple Vitamins-Minerals (CENTRUM SILVER 50+WOMEN) TABS take 1 tablet by oral route every day   NON FORMULARY Take 2 capsules by mouth 2 (two) times daily. Hydro Eye   olopatadine (PATANOL) 0.1 % ophthalmic solution Place 1 drop into both eyes 2 (two) times daily as needed for allergies.   oxyCODONE-acetaminophen (PERCOCET) 5-325 MG tablet Take 1 tablet by mouth every 8 (eight) hours as needed for severe pain.   pantoprazole (PROTONIX) 40 MG tablet TAKE 1 TABLET (40 MG TOTAL) BY MOUTH DAILY. TAKE 30-60 MIN BEFORE FIRST MEAL OF THE DAY   Phenylephrine-Acetaminophen (SINUS PRESSURE + PAIN) 5-325 MG TABS Take by mouth as directed.   Potassium Chloride ER 20 MEQ TBCR Take 1 tablet by mouth daily.   Probiotic Product (PROBIOTIC DAILY  PO) Take 1 capsule by mouth daily.   trimethoprim (TRIMPEX) 100 MG tablet Take 100 mg by mouth daily.   No facility-administered encounter medications on file as of 06/29/2021.     Objective:     PHYSICAL EXAMINATION:    VITALS:   There were no vitals filed for this visit.   GEN:  The patient appears stated age and is in NAD. HEENT:  Normocephalic, atraumatic.   Neurological examination:  General: NAD, well-groomed, appears stated age.  Anxious appearing Orientation: The patient is alert. Oriented to person, place and date Cranial nerves: There is good facial symmetry.The speech is fluent and clear. No aphasia or dysarthria. Fund of knowledge is appropriate. Recent and remote memory are impaired. Attention and concentration are normal.  Able to name objects and repeat phrases.  Hearing is intact to conversational tone.    Sensation: Sensation is intact to light touch throughout Motor: Strength is at least antigravity x4.  Uses a walker to ambulate Tremors: none  DTR's 2/4 in UE/LE    No flowsheet data found. MMSE - Mini Mental State Exam 11/23/2018 12/06/2017 06/03/2016  Orientation to time '4 5 5  '$ Orientation to Place '5 5 5  '$ Registration '3 3 3  '$ Attention/ Calculation '5 5 5  '$ Recall '3 3 3  '$ Language- name 2 objects '2 2 2  '$ Language- repeat '1 1 1  '$ Language- follow 3 step command '3 3 3  '$ Language- read & follow direction '1 1 1  '$ Write a sentence '1 1 1  '$ Copy design '1 1 1  '$ Total score '29 30 30    '$ [employer] Mental Exam 11/13/2019  Weekday Correct 1  Current year 1  What state are we in? 1  Amount spent 1  Amount left 2  # of Animals 3  5 objects recall 3  Number series 0  Hour markers 2  Time correct 2  Placed X in triangle correctly 1  Largest Figure 1  Name of female 2  Date back to work 2  Type of work 0  State she lived in 2  Total score 24       Movement examination: Tone: There is normal tone in the UE/LE Abnormal movements:  no tremor.  No myoclonus.   No asterixis.   Coordination:  There is no decremation with RAM's. Normal finger to nose  Gait and Station: The patient has no difficulty arising out of a deep-seated chair without the use of the hands. The patient's stride length is good.  Gait is cautious and narrow.        Total time spent on today's visit was 30  minutes, including both face-to-face time and nonface-to-face time. Time included that spent on review of records (prior notes available to me/labs/imaging if pertinent), discussing treatment and goals, answering patient's questions and coordinating care.  Cc:  Buzzy Han, MD Sharene Butters, PA-C

## 2021-06-29 ENCOUNTER — Ambulatory Visit: Payer: Medicare PPO | Admitting: Physician Assistant

## 2021-06-29 ENCOUNTER — Other Ambulatory Visit: Payer: Self-pay

## 2021-06-29 ENCOUNTER — Encounter: Payer: Self-pay | Admitting: Physician Assistant

## 2021-06-29 ENCOUNTER — Other Ambulatory Visit: Payer: Self-pay | Admitting: Internal Medicine

## 2021-06-29 VITALS — BP 160/79 | HR 75 | Resp 18 | Ht 64.0 in | Wt 153.0 lb

## 2021-06-29 DIAGNOSIS — G3184 Mild cognitive impairment, so stated: Secondary | ICD-10-CM | POA: Diagnosis not present

## 2021-06-29 DIAGNOSIS — G44021 Chronic cluster headache, intractable: Secondary | ICD-10-CM | POA: Diagnosis not present

## 2021-06-29 MED ORDER — TIZANIDINE HCL 2 MG PO TABS: 2.0000 mg | ORAL_TABLET | Freq: Two times a day (BID) | ORAL | 11 refills | Status: DC

## 2021-06-29 NOTE — Patient Instructions (Addendum)
Good to see you!  ? ?Follow-up with Dr. Tomi Likens in 4 months for headaches  ?Continue  tizanidine 2 mg twice a day  ?Discontinue cyclobenzaprine (flexeril) ?Discontinue toparimate (topamax )  ?Continue the gabapentin dose  ?Follow up with primary doctor to monitor your blood pressure  ASAP  ? ? ? ?FALL PRECAUTIONS: Be cautious when walking. Scan the area for obstacles that may increase the risk of trips and falls. When getting up in the mornings, sit up at the edge of the bed for a few minutes before getting out of bed. Consider elevating the bed at the head end to avoid drop of blood pressure when getting up. Walk always in a well-lit room (use night lights in the walls). Avoid area rugs or power cords from appliances in the middle of the walkways. Use a walker or a cane if necessary and consider physical therapy for balance exercise. Get your eyesight checked regularly. ? ? ?RECOMMENDATIONS FOR ALL PATIENTS WITH MEMORY PROBLEMS: ?1. Continue to exercise (Recommend 30 minutes of walking everyday, or 3 hours every week) ?2. Increase social interactions - continue going to [place] and enjoy social gatherings with friends and family ?3. Eat healthy, avoid fried foods and eat more fruits and vegetables ?4. Maintain adequate blood pressure, blood sugar, and blood cholesterol level. Reducing the risk of stroke and cardiovascular disease also helps promoting better memory. ?5. Avoid stressful situations. Live a simple life and avoid aggravations. Organize your time and prepare for the next day in anticipation. ?6. Sleep well, avoid any interruptions of sleep and avoid any distractions in the bedroom that may interfere with adequate sleep quality ?7. Avoid sugar, avoid sweets as there is a strong link between excessive sugar intake, diabetes, and cognitive impairment ?The Mediterranean diet has been shown to help patients reduce the risk of progressive memory disorders and reduces cardiovascular risk. This includes eating  fish, eat fruits and green leafy vegetables, nuts like almonds and hazelnuts, walnuts, and also use olive oil. Avoid fast foods and fried foods as much as possible. Avoid sweets and sugar as sugar use has been linked to worsening of memory function. ? ?

## 2021-07-06 ENCOUNTER — Ambulatory Visit: Payer: Medicare PPO | Admitting: Physician Assistant

## 2021-07-07 ENCOUNTER — Ambulatory Visit: Payer: Medicare PPO | Admitting: Physician Assistant

## 2021-08-04 ENCOUNTER — Telehealth: Payer: Self-pay | Admitting: Physician Assistant

## 2021-08-04 NOTE — Telephone Encounter (Signed)
This patient called and stated she had an injection.  The injection did not help, she is having sharp pains in her head. ?

## 2021-08-04 NOTE — Telephone Encounter (Signed)
Pt called in and left a message. She would like to see what she can do about her injection not doing anything.  ?

## 2021-08-05 ENCOUNTER — Other Ambulatory Visit: Payer: Self-pay | Admitting: Physician Assistant

## 2021-08-05 ENCOUNTER — Other Ambulatory Visit (HOSPITAL_COMMUNITY): Payer: Self-pay

## 2021-08-05 ENCOUNTER — Telehealth: Payer: Self-pay | Admitting: Pharmacy Technician

## 2021-08-05 MED ORDER — AIMOVIG 140 MG/ML ~~LOC~~ SOAJ: 140.0000 mg | SUBCUTANEOUS | 5 refills | Status: DC

## 2021-08-05 NOTE — Telephone Encounter (Signed)
Patient Advocate Encounter ? ?Received notification from [facility] that prior authorization for AIMOVIG '140MG'$ /ML is required. ?  ?PA submitted on 4.19.23 ?Key [id number] ?Status is pending ?  ?[facility] will continue to follow ? ?Koron Godeaux R Gerrit Rafalski, CPhT ?Patient Advocate ?Phone: [phone] ?Fax:  [phone] ? ?

## 2021-08-06 NOTE — Telephone Encounter (Signed)
Patient Advocate Encounter ? ?Received notification from [facility] that the request for prior authorization for Aimovig '140mg'$ /ml has been denied due to the patient not having more than 4 migraines per month.   ?  ? ?Specialty Pharmacy Patient Advocate ?Fax: [phone]  ?

## 2021-08-12 ENCOUNTER — Other Ambulatory Visit: Payer: Self-pay | Admitting: Family Medicine

## 2021-09-10 ENCOUNTER — Other Ambulatory Visit: Payer: Self-pay | Admitting: Nurse Practitioner

## 2021-09-10 DIAGNOSIS — M542 Cervicalgia: Secondary | ICD-10-CM

## 2021-09-10 DIAGNOSIS — G44229 Chronic tension-type headache, not intractable: Secondary | ICD-10-CM

## 2021-09-17 ENCOUNTER — Other Ambulatory Visit: Payer: Self-pay | Admitting: Nurse Practitioner

## 2021-09-17 DIAGNOSIS — M542 Cervicalgia: Secondary | ICD-10-CM

## 2021-09-17 DIAGNOSIS — G44229 Chronic tension-type headache, not intractable: Secondary | ICD-10-CM

## 2021-09-18 ENCOUNTER — Other Ambulatory Visit: Payer: Self-pay | Admitting: Nurse Practitioner

## 2021-09-18 DIAGNOSIS — G44229 Chronic tension-type headache, not intractable: Secondary | ICD-10-CM

## 2021-09-18 DIAGNOSIS — M542 Cervicalgia: Secondary | ICD-10-CM

## 2021-09-18 NOTE — Telephone Encounter (Signed)
Requested medication (s) are due for refill today:   No  Requested medication (s) are on the active medication list:   No  Future visit scheduled:   No   Last ordered: Discontinued 05/07/2021     Requested Prescriptions  Pending Prescriptions Disp Refills   topiramate (TOPAMAX) 25 MG tablet [Pharmacy Med Name: TOPIRAMATE 25 MG TABLET] 90 tablet 1    Sig: Take 1 tablet (25 mg total) by mouth daily. FOR MIGRAINE     Neurology: Anticonvulsants - topiramate & zonisamide Passed - 09/18/2021 10:13 AM      Passed - Cr in normal range and within 360 days    Creatinine, Ser  Date Value Ref Range Status  05/01/2021 0.76 0.44 - 1.00 mg/dL Final   Creatinine,U  Date Value Ref Range Status  05/10/2016 68.7 mg/dL Final         Passed - CO2 in normal range and within 360 days    CO2  Date Value Ref Range Status  05/01/2021 22 22 - 32 mmol/L Final         Passed - ALT in normal range and within 360 days    ALT  Date Value Ref Range Status  11/11/2020 7 0 - 35 U/L Final         Passed - AST in normal range and within 360 days    AST  Date Value Ref Range Status  11/11/2020 16 0 - 37 U/L Final         Passed - Completed PHQ-2 or PHQ-9 in the last 360 days      Passed - Valid encounter within last 12 months    Recent Outpatient Visits           9 months ago Encounter to establish care   [place], Vernia Buff, NP

## 2021-09-28 ENCOUNTER — Other Ambulatory Visit: Payer: Self-pay | Admitting: Internal Medicine

## 2021-10-29 ENCOUNTER — Encounter: Payer: Self-pay | Admitting: Neurology

## 2021-10-29 ENCOUNTER — Ambulatory Visit: Payer: Medicare PPO | Admitting: Neurology

## 2021-10-29 VITALS — BP 162/82 | HR 74 | Ht 63.0 in | Wt 154.4 lb

## 2021-10-29 DIAGNOSIS — G3184 Mild cognitive impairment, so stated: Secondary | ICD-10-CM

## 2021-10-29 DIAGNOSIS — G44321 Chronic post-traumatic headache, intractable: Secondary | ICD-10-CM | POA: Diagnosis not present

## 2021-10-29 MED ORDER — GABAPENTIN 300 MG PO CAPS: ORAL_CAPSULE | ORAL | 11 refills | Status: DC

## 2021-10-29 NOTE — Patient Instructions (Addendum)
Good to see you.  Your Gabapentin prescription will be changed to Gabapentin '300mg'$ : take 1 capsule in AM, 1 capsule at noon, and 2 capsules at bedtime  2. Start slowly weaning down the Tramadol intake until you are taking it 2-3 times a week, otherwise headaches will be harder to treat  3. Follow-up in 6 months, call for any changes

## 2021-10-29 NOTE — Progress Notes (Signed)
NEUROLOGY FOLLOW UP OFFICE NOTE  [name] [id number] [birth year]  HISTORY OF PRESENT ILLNESS: I had the pleasure of seeing Zavannah Deblois in follow-up in the neurology clinic on 10/29/2021.  The patient was last seen 4 months ago by Memory Disorders PA Sharene Butters for headaches and memory loss. She did not want to discuss her memory issues at that time, mostly concerned about the daily headaches that started after she fell and hit her head in July 2022. She was previously on Topiramate. She is on Gabapentin '100mg'$  3 caps TID ('300mg'$  TID) for chronic pain. She continues to see Neurosurgery for her neck/back and had injections in her occipital and cervical region with no improvement in headaches. The pain goes down behind her left ear, worst in the left occipital region. She does not take Tylenol daily because she was prescribed Tramadol '50mg'$  BID prn but has been taking it regularly BID because she needs it for the pain. She reports 5/10 pain today, no nausea/vomiting. She is also dealing with left knee pain and has an appointment with Ortho in August. She notes several times during the visit that she is forgetful, she does not remember the dose of her gabapentin and a recently prescribed medication that was doubled, but states she is good with remembering to take her medications regularly and denies any issues managing her finances. She lives in [place] living at [facility] She expressed concerns that she does not want to give up her financial independence to her son but that he had told [place] not to call him if something happened to her because she did not agree to give up her finances to him. She denies any falls in the past year. Sleep is okay. She states there is a Librarian, academic from [employer] that comes once a month and checks on her. Her sister-in-law and nephew live locally and help her sometimes. Her son lives in [place].   History on Initial Assessment  11/13/2019: This is a pleasant 88 year old right-handed woman with a history of hypertension, hyperlipidemia, SVT, migraines, anxiety, depression, presenting for evaluation of memory loss. She lives at [facility] and has been going to [facility] for her memory and falls and feels better. She states her memory is not as good as she wishes. She started noticing memory changes around 5 years ago, she would forget where her fridge was while she was in the kitchen. She continues to manage her medications and finances and denies any issues. She states her son wanted her to turn it over to him but she refused, "he disclaimed me and is not calling me anymore." She does not have a car. She reports he would not take her to the closing of her home and he told some of her doctors one day that he could say she was failing and he needed to take charge of her health, then told the managers at [facility] that he was through with her. She moved to [place] in December 2020. She has a sister-in-law and nephew who keep an eye on her and call her every few days. Her niece in [place] comes once a year, they talk on the phone and has told her she could not remember some things. She reports troubling hallucinations, but she figured out herself that it was the Propranolol and after stopping it several months ago, the hallucinations stopped. She was having auditory>visual hallucinations, hearing people in the living room, she thought she had  been in a meeting with some of them. One night she needed to use the bathroom and saw 2 women sitting on a bench at the foot of her bed talking to each other. There is a family history of dementia in her younger deceased brother, maternal aunt, and cousin.    She has occasional migraines. She has left hip pain. She fell in 2019 and continues to have pain in the back of her head and neck. She denies any dizziness, diplopia, dysarthria/dysphagia, focal numbness/tingling/weakness,  bowel/bladder dysfunction, anosmia, or tremors. She has done PT at 3 different places, working on her balance. She uses her walker or cane most of the time, but comes today with no assistive devices. No recent falls.    I personally reviewed head CT without contrast done 09/2018 with no acute changes, there was age-related diffuse atrophy and chronic microvascular disease.     Laboratory Data:      Lab Results  Component Value Date    TSH 2.15 09/27/2019         Lab Results  Component Value Date    VITAMINB12 256 11/23/2018    CT head 05/01/21 No acute intracranial findings.  No change from prior.  Atrophy and white matter microvascular disease.  PAST MEDICAL HISTORY: Past Medical History:  Diagnosis Date   Anxiety and depression    Back pain    lumbar   Fecal incontinence    Dr. Michail Sermon, likely due to loose stools in conjunction with pelvic muscle laxity from aging, recommended to stop Miralax   GI (gastrointestinal bleed)    hx of colon polyps 2004, diverticulosis, internal hemmorhids   Headache    Headache(784.0)    Hydronephrosis    Right 2008 s/p urology eval CT/cysto: observation/monitor   Hyperlipidemia    Hypertension    Knee pain    right chronic   Migraine    on topamax , per neuro   Osteoporosis    Pulmonary nodules    [place]. First seen by CT only 05/04/03, no change on f/u 08/08/06 -Macroscopic changes rml 04/27/07 > resolved april 6,2010 no further w/u    Seborrheic keratosis    Left inferior posterior flank   SVT (supraventricular tachycardia) ([place])    sees Dr.Ross    MEDICATIONS: Current Outpatient Medications on File Prior to Visit  Medication Sig Dispense Refill   acetaminophen (TYLENOL) 325 MG tablet Take 650 mg by mouth every 6 (six) hours as needed.     amLODipine (NORVASC) 5 MG tablet Take 1 tablet (5 mg total) by mouth daily. NEEDS APPOINTMENT FOR FUTURE REFILL / 1st Attempt 90 tablet 0   amoxicillin (AMOXIL) 500 MG capsule Take 500 mg by mouth as  needed. 4 caps before going to the dent     atorvastatin (LIPITOR) 40 MG tablet TAKE 1 TABLET BY MOUTH EVERYDAY AT BEDTIME 90 tablet 1   Bismuth Subsalicylate (PEPTO-BISMOL PO) Take 1 tablet by mouth daily as needed.      Ca [id number] (CALCIUM-FOLIC ACID PLUS D PO) Take by mouth daily.     celecoxib (CELEBREX) 200 MG capsule TAKE 1 CAPSULE BY MOUTH EVERY DAY AS NEEDED FOR MODERATE PAIN 30 capsule 5   CRANBERRY PO Take 1 capsule by mouth daily.     dextromethorphan-guaiFENesin (MUCINEX DM) 30-600 MG 12hr tablet Take 1 tablet by mouth 2 (two) times daily as needed for cough.     escitalopram (LEXAPRO) 10 MG tablet TAKE 1 TABLET BY MOUTH EVERY DAY 90 tablet 1  Liniments (BLUE-EMU SUPER STRENGTH) CREA Apply 1 application topically at bedtime as needed (dry skin).     loperamide (IMODIUM A-D) 2 MG tablet Take 2 mg by mouth 4 (four) times daily as needed for diarrhea or loose stools.     loratadine (CLARITIN) 10 MG tablet TAKE 1 TABLET BY MOUTH EVERY DAY 90 tablet 1   losartan (COZAAR) 25 MG tablet Take 25 mg by mouth daily.     losartan (COZAAR) 50 MG tablet Take 50 mg by mouth daily.     meloxicam (MOBIC) 7.5 MG tablet Take one tab daily as neede 30 tablet 2   Menthol, Topical Analgesic, (BLUE-EMU MAXIMUM STRENGTH EX) Apply topically. Apply topically as needed.     Multiple Vitamins-Minerals (CENTRUM SILVER 50+WOMEN) TABS take 1 tablet by oral route every day     NON FORMULARY Take 2 capsules by mouth 2 (two) times daily. Hydro Eye     olopatadine (PATANOL) 0.1 % ophthalmic solution Place 1 drop into both eyes 2 (two) times daily as needed for allergies.     oxyCODONE-acetaminophen (PERCOCET) 5-325 MG tablet Take 1 tablet by mouth every 8 (eight) hours as needed for severe pain. 20 tablet 0   pantoprazole (PROTONIX) 40 MG tablet TAKE 1 TABLET (40 MG TOTAL) BY MOUTH DAILY. TAKE 30-60 MIN BEFORE FIRST MEAL OF THE DAY 90 tablet 1   Phenylephrine-Acetaminophen (SINUS PRESSURE + PAIN)  5-325 MG TABS Take by mouth as directed.     Potassium Chloride ER 20 MEQ TBCR Take 1 tablet by mouth daily. 90 tablet 1   Probiotic Product (PROBIOTIC DAILY PO) Take 1 capsule by mouth daily.     tiZANidine (ZANAFLEX) 2 MG tablet Take 1 tablet (2 mg total) by mouth in the morning and at bedtime. 60 tablet 11   traMADol (ULTRAM) 50 MG tablet Take 50 mg by mouth every 6 (six) hours as needed.     trimethoprim (TRIMPEX) 100 MG tablet Take 100 mg by mouth daily.     No current facility-administered medications on file prior to visit.    ALLERGIES: Allergies  Allergen Reactions   Amitriptyline Hcl     REACTION: hallucinations   Carisoprodol     REACTION: nightmares,rash,tachycardia   Codeine     REACTION: "deathly ill" nauesa, dizzy,weak   Fosamax [Alendronate Sodium] Other (See Comments)    Dental issues   Meloxicam     REACTION: sensitive to sun; facial redness; may cause severe abd pain   Metronidazole     REACTION: abd pain   Nortriptyline Hcl     REACTION: hallucinations   Oxaprozin     REACTION: abd pain   Promethazine Hcl     REACTION: spastic arms and legs flailing while awake and unusual behavior while sleep - walking all over house during night   Propranolol Other (See Comments)    hallucinations   Rofecoxib     REACTION: rash   Sumatriptan     REACTION: abdominal pain, blurry vision, drowsiness   Tramadol Other (See Comments)    Dizziness and constipation   Venlafaxine     Hallucinations     FAMILY HISTORY: Family History  Problem Relation Age of Onset   Breast cancer Mother        later in life   Diabetes Mother        late in life   Heart disease Mother        late onset   Lung cancer Father  apparently had TB   Heart disease Father        late onset   Prostate cancer Father    Colitis Brother    Cancer Brother    Colon cancer Neg Hx     SOCIAL HISTORY: Social History   Socioeconomic History   Marital status: Widowed    Spouse name:  Not on file   Number of children: 1   Years of education: college    Highest education level: Doctorate  Occupational History   Occupation: retired   Tobacco Use   Smoking status: Never   Smokeless tobacco: Never  Vaping Use   Vaping Use: Never used  Substance and Sexual Activity   Alcohol use: Yes    Alcohol/week: 1.0 standard drink of alcohol    Types: 1 Glasses of wine per week    Comment: rarely, 1 glass of wine monthly   Drug use: No   Sexual activity: Not Currently  Other Topics Concern   Not on file  Social History Narrative   Moved from a retirement home to a condominium 04-2013, independent living,  lives by herself , drives    Son lives in [place]   Right handed    1 cup of caffeine per day       Lives in 3 story home but resides on the 2nd floor    Social Determinants of Health   Financial Resource Strain: Not on file  Food Insecurity: Not on file  Transportation Needs: Not on file  Physical Activity: Not on file  Stress: Not on file  Social Connections: Not on file  Intimate Partner Violence: Not on file     PHYSICAL EXAM: Vitals:   10/29/21 1011  BP: (!) 162/82  Pulse: 74  SpO2: 95%   General: No acute distress Head:  Normocephalic/atraumatic Skin/Extremities: No rash, no edema Neurological Exam: alert and awake. No aphasia or dysarthria. Fund of knowledge is appropriate.  Attention and concentration are normal.   Cranial nerves: Pupils equal, round. Extraocular movements intact with no nystagmus. Visual fields full.  No facial asymmetry.  Motor: Bulk and tone normal, muscle strength 5/5 throughout with no pronator drift.   Finger to nose testing intact.  Gait slow and cautious favoring left leg due to left knee pain with walker. No ataxia   IMPRESSION: This is a pleasant 88 yo RH woman with a history of hypertension, hyperlipidemia, SVT, migraines, anxiety, depression, who presented in 2021 for memory loss. Neuropsychological evaluation in 12/2019  indicated Mild Cognitive Impairment, with no indication of a neurodegenerative process/dementia. She continues to report forgetfulness but denies any difficulties with complex tasks. Discussed planning for the future and speaking with her son about checking behind her on finances but still for her to have control over it as long as able. Main concern today continues to be daily headaches since head injury in July 2022, we discussed increasing Gabapentin to 300-300-'600mg'$  and minimizing Tramadol or any over the counter pain medication to 2-3 a week to avoid rebound headaches/medication overuse headaches. Follow-up in 6 months, call for any changes.   Thank you for allowing me to participate in her care.  Please do not hesitate to call for any questions or concerns.    Ellouise Newer, M.D.   CC: Dr. Darlina Sicilian

## 2021-11-13 ENCOUNTER — Other Ambulatory Visit: Payer: Self-pay | Admitting: Nurse Practitioner

## 2021-11-13 DIAGNOSIS — Z1231 Encounter for screening mammogram for malignant neoplasm of breast: Secondary | ICD-10-CM

## 2021-12-05 NOTE — Progress Notes (Unsigned)
Cardiology Office Note:    Date:  12/07/2021   ID:  [name], DOB 07-22-1929, MRN [id number]  PCP:  Buzzy Han, MD (Inactive)   [facility] Providers Cardiologist:  None     Referring MD: No ref. provider found   Chief Complaint: follow-up chest pain  History of Present Illness:    [name] is a very pleasant 88 y.o. female with a hx of HTN, SVT, HLD, memory loss, chronic leg edema, frequent falls  Previously seen by Dr. Bettina Gavia, then followed by Dr. Marlou Porch.  Hypertension has been difficult to control.  She was last seen in our office on 11/15/2019 by Dr. Marlou Porch at which time she was advised to return for follow-up in 12 months.  There is an office visit note from [facility] cardiology from 08/06/2021 for evaluation of atypical chest pain and mild aortic valve stenosis for which she was advised to have echocardiogram.  Today, she is here alone for follow-up.  States she is here due to an episode of chest pain that occurred on 1 occasion several months ago. I asked about her visit to [facility] and she does not recall that visit. No further episodes of chest pain. Sleeps in a recliner due to bed is uncomfortable, no concerns with orthopnea, PND. Reports chronic DOE, stable for many years per her report. Walks with a rollator. Lives at [place] and purposely picked a units to live and that is far from [employer] so that she will increase her number of steps. No lightheadedness, presyncope, syncope. Elder Care provider comes to see her one time per month at her home. Planning for knee surgery in the future and will need cardiac clearance. No edema. Reports BP has been well-controlled.  We called her pharmacy to determine her dose of losartan which is 50 mg daily.  Past Medical History:  Diagnosis Date   Anxiety and depression    Back pain    lumbar   Fecal incontinence    Dr. Michail Sermon, likely due to loose stools  in conjunction with pelvic muscle laxity from aging, recommended to stop Miralax   GI (gastrointestinal bleed)    hx of colon polyps 2004, diverticulosis, internal hemmorhids   Headache    Headache(784.0)    Hydronephrosis    Right 2008 s/p urology eval CT/cysto: observation/monitor   Hyperlipidemia    Hypertension    Knee pain    right chronic   Migraine    on topamax , per neuro   Osteoporosis    Pulmonary nodules    [place]. First seen by CT only 05/04/03, no change on f/u 08/08/06 -Macroscopic changes rml 04/27/07 > resolved april 6,2010 no further w/u    Seborrheic keratosis    Left inferior posterior flank   SVT (supraventricular tachycardia) ([place])    sees Dr.Ross    Past Surgical History:  Procedure Laterality Date   ABDOMINAL HYSTERECTOMY     APPENDECTOMY     BASAL CELL CARCINOMA EXCISION     BLADDER REPAIR     CATARACT EXTRACTION  2008   Dr.Digby   CHOLECYSTECTOMY     lumbar reconstructive surgery  01/07/2009   R ovary removed     ROTATOR CUFF REPAIR  9/-2011   SHOULDER SURGERY  12-2011   LEFT   TONSILLECTOMY     TOTAL KNEE ARTHROPLASTY     right   trigger finger surgery     L thumb (2014), R ring finger 05-2013  Current Medications: Current Meds  Medication Sig   acetaminophen (TYLENOL) 325 MG tablet Take 650 mg by mouth every 6 (six) hours as needed.   amLODipine (NORVASC) 5 MG tablet Take 1 tablet (5 mg total) by mouth daily. NEEDS APPOINTMENT FOR FUTURE REFILL / 1st Attempt   amoxicillin (AMOXIL) 500 MG capsule Take 500 mg by mouth as needed. 4 caps before going to the dent   atorvastatin (LIPITOR) 40 MG tablet TAKE 1 TABLET BY MOUTH EVERYDAY AT BEDTIME   Bismuth Subsalicylate (PEPTO-BISMOL PO) Take 1 tablet by mouth daily as needed.    Ca [id number] (CALCIUM-FOLIC ACID PLUS D PO) Take by mouth daily.   celecoxib (CELEBREX) 200 MG capsule TAKE 1 CAPSULE BY MOUTH EVERY DAY AS NEEDED FOR MODERATE PAIN   CRANBERRY PO Take 1 capsule by mouth daily.    dextromethorphan-guaiFENesin (MUCINEX DM) 30-600 MG 12hr tablet Take 1 tablet by mouth 2 (two) times daily as needed for cough.   escitalopram (LEXAPRO) 10 MG tablet TAKE 1 TABLET BY MOUTH EVERY DAY   gabapentin (NEURONTIN) 300 MG capsule Take 1 capsule in AM, 1 capsule at noon, and 2 capsules at bedtime   Liniments (BLUE-EMU SUPER STRENGTH) CREA Apply 1 application topically at bedtime as needed (dry skin).   loperamide (IMODIUM A-D) 2 MG tablet Take 2 mg by mouth 4 (four) times daily as needed for diarrhea or loose stools.   loratadine (CLARITIN) 10 MG tablet TAKE 1 TABLET BY MOUTH EVERY DAY   losartan (COZAAR) 50 MG tablet Take 50 mg by mouth daily.   meloxicam (MOBIC) 7.5 MG tablet Take one tab daily as neede   Menthol, Topical Analgesic, (BLUE-EMU MAXIMUM STRENGTH EX) Apply topically. Apply topically as needed.   Multiple Vitamins-Minerals (CENTRUM SILVER 50+WOMEN) TABS take 1 tablet by oral route every day   NON FORMULARY Take 2 capsules by mouth 2 (two) times daily. Hydro Eye   olopatadine (PATANOL) 0.1 % ophthalmic solution Place 1 drop into both eyes 2 (two) times daily as needed for allergies.   oxyCODONE-acetaminophen (PERCOCET) 5-325 MG tablet Take 1 tablet by mouth every 8 (eight) hours as needed for severe pain.   pantoprazole (PROTONIX) 40 MG tablet TAKE 1 TABLET (40 MG TOTAL) BY MOUTH DAILY. TAKE 30-60 MIN BEFORE FIRST MEAL OF THE DAY   Phenylephrine-Acetaminophen (SINUS PRESSURE + PAIN) 5-325 MG TABS Take by mouth as directed.   Potassium Chloride ER 20 MEQ TBCR Take 1 tablet by mouth daily.   Probiotic Product (PROBIOTIC DAILY PO) Take 1 capsule by mouth daily.   tiZANidine (ZANAFLEX) 2 MG tablet Take 1 tablet (2 mg total) by mouth in the morning and at bedtime.   traMADol (ULTRAM) 50 MG tablet Take 50 mg by mouth every 6 (six) hours as needed.   trimethoprim (TRIMPEX) 100 MG tablet Take 100 mg by mouth daily.     Allergies:   Amitriptyline hcl, Carisoprodol, Codeine,  Fosamax [alendronate sodium], Meloxicam, Metronidazole, Nortriptyline hcl, Oxaprozin, Promethazine hcl, Propranolol, Rofecoxib, Sumatriptan, Tramadol, and Venlafaxine   Social History   Socioeconomic History   Marital status: Widowed    Spouse name: Not on file   Number of children: 1   Years of education: college    Highest education level: Doctorate  Occupational History   Occupation: retired   Tobacco Use   Smoking status: Never   Smokeless tobacco: Never  Vaping Use   Vaping Use: Never used  Substance and Sexual Activity   Alcohol use: Yes  Alcohol/week: 1.0 standard drink of alcohol    Types: 1 Glasses of wine per week    Comment: rarely, 1 glass of wine monthly   Drug use: No   Sexual activity: Not Currently  Other Topics Concern   Not on file  Social History Narrative   Moved from a retirement home to a condominium 04-2013, independent living,  lives by herself , drives    Son lives in [place]   Right handed    1 cup of caffeine per day       Lives in 3 story home but resides on the 2nd floor    Social Determinants of Health   Financial Resource Strain: Not on file  Food Insecurity: Not on file  Transportation Needs: Not on file  Physical Activity: Not on file  Stress: Not on file  Social Connections: Not on file     Family History: The patient's family history includes Breast cancer in her mother; Cancer in her brother; Colitis in her brother; Diabetes in her mother; Heart disease in her father and mother; Lung cancer in her father; Prostate cancer in her father. There is no history of Colon cancer.  ROS:   Please see the history of present illness.  All other systems reviewed and are negative.  Labs/Other Studies Reviewed:    The following studies were reviewed today:  Echo 05/02/18  Left ventricle:  The cavity size was normal. Wall thickness was  normal. Systolic function was normal. The estimated ejection  fraction was in the range of 55% to 60%.  Wall motion was normal;  there were no regional wall motion abnormalities. Doppler  parameters are consistent with abnormal left ventricular relaxation  (grade 1 diastolic dysfunction). There was no evidence of elevated  ventricular filling pressure by Doppler parameters.   Aortic valve:   Trileaflet; normal thickness, mildly calcified  leaflets. Mobility was not restricted.  Doppler:  Transvalvular  velocity was within the normal range. There was no stenosis. There  was no regurgitation.  Aorta: The aorta was normal, not dilated, and non-diseased. Aortic  root: The aortic root was normal in size.  Mitral valve:   Mildly calcified annulus. Mobility was not  restricted.  Doppler:  Transvalvular velocity was within the normal  range. There was no evidence for stenosis. There was mild  regurgitation.    Valve area by pressure half-time: 3.1 cm^2.  Indexed valve area by pressure half-time: 1.72 cm^2/m^2.  Left atrium:  The atrium was normal in size.  Atrial septum:  The septum was normal.  Right ventricle:  The cavity size was normal. Wall thickness was  normal. Systolic function was normal.  Pulmonic valve:    Structurally normal valve.   Cusp separation was  normal.  Doppler:  Transvalvular velocity was within the normal  range. There was no evidence for stenosis. There was no  regurgitation.  Tricuspid valve:   Structurally normal valve.    Doppler:  Transvalvular velocity was within the normal range. There was  trivial regurgitation.  Pulmonary artery:   The main pulmonary artery was normal-sized.  Systolic pressure was within the normal range.  Right atrium:  The atrium was normal in size.  Pericardium: The pericardium was normal in appearance. There was  no pericardial effusion.   Recent Labs: 05/01/2021: BUN 12; Creatinine, Ser 0.76; Hemoglobin 12.7; Platelets 297; Potassium 3.9; Sodium 139  Recent Lipid Panel    Component Value Date/Time   CHOL 124 11/11/2020 1125   CHOL 146  04/30/2019 1111   TRIG 167.0 (H) 11/11/2020 1125   HDL 40.50 11/11/2020 1125   HDL 48 04/30/2019 1111   CHOLHDL 3 11/11/2020 1125   VLDL 33.4 11/11/2020 1125   LDLCALC 50 11/11/2020 1125   LDLCALC 72 04/30/2019 1111   LDLDIRECT 78.0 12/20/2012 1148     Risk Assessment/Calculations:      Physical Exam:    VS:  BP 110/60 (BP Location: Left Arm, Patient Position: Sitting, Cuff Size: Normal)   Pulse 67   Ht '5\' 3"'$  (1.6 m)   Wt 155 lb (70.3 kg)   SpO2 95%   BMI 27.46 kg/m     Wt Readings from Last 3 Encounters:  12/07/21 155 lb (70.3 kg)  10/29/21 154 lb 6.4 oz (70 kg)  06/29/21 153 lb (69.4 kg)     GEN:  Well nourished, well developed in no acute distress HEENT: Normal NECK: No JVD; No carotid bruits CARDIAC: RRR, no murmurs, rubs, gallops RESPIRATORY:  Clear to auscultation without rales, wheezing or rhonchi  ABDOMEN: Soft, non-tender, non-distended MUSCULOSKELETAL:  No edema; No deformity. 2+ pedal pulses, equal bilaterally SKIN: Warm and dry NEUROLOGIC:  Alert and oriented x 3 PSYCHIATRIC:  Normal affect   EKG:  EKG is ordered today.  The ekg ordered today demonstrates NSR at 67 bpm, nonspecific T wave abnormality   Diagnoses:    1. Pre-operative clearance   2. Chest pain of uncertain etiology   3. Chronic heart failure with preserved ejection fraction ([place])   4. SVT (supraventricular tachycardia) ([place])   5. Hyperlipidemia, unspecified hyperlipidemia type    Assessment and Plan:     Preoperative cardiac evaluation: No prior evidence of CAD upon chart review, however she is unable to achieve > 4 METS activity. We will get a nuclear stress test for evaluation of ischemia. Her RCRI for MACE is Class II, 0.9%.    Chest pain: Reports an episode of chest pain that occurred several months ago.  No recurrence.  She has chronic DOE and has limited activity due to unable to ambulate without her walker. Was seen by [facility] 07/2021 and it appears an echo was  ordered, however I cannot see these results. Will get lexiscan myoview for evaluation of ischemia and to provide cardiac clearance for possible knee surgery in the future.  Hypertension: BP is somewhat soft today. She denies lightheadedness, presyncope, syncope. Has a nurse that comes to monitor BP once per month.  Would favor reduction of losartan to 25 mg daily if BP remains consistently < 120/80.   SVT: No episodes of palpitations or elevated HR to report.   HFpEF: LVEF 55-60%, G1DD on echo 04/2018. Possibly had repeat echo with [facility]? No evidence of volume overload on exam. Has chronic DOE, denies edema, orthopnea, PND.  Encouraged low-sodium diet.  Continue losartan.  Shared Decision Making/Informed Consent The risks [chest pain, shortness of breath, cardiac arrhythmias, dizziness, blood pressure fluctuations, myocardial infarction, stroke/transient ischemic attack, nausea, vomiting, allergic reaction, radiation exposure, metallic taste sensation and life-threatening complications (estimated to be 1 in 10,000)], benefits (risk stratification, diagnosing coronary artery disease, treatment guidance) and alternatives of a nuclear stress test were discussed in detail with Ms. Maeda and she agrees to proceed.   Disposition: 1 year with Dr. Marlou Porch  Medication Adjustments/Labs and Tests Ordered: Current medicines are reviewed at length with the patient today.  Concerns regarding medicines are outlined above.  Orders Placed This Encounter  Procedures   MYOCARDIAL PERFUSION IMAGING   EKG 12-Lead  No orders of the defined types were placed in this encounter.   Patient Instructions  Medication Instructions:  Your physician recommends that you continue on your current medications as directed. Please refer to the Current Medication list given to you today.  *If you need a refill on your cardiac medications before your next appointment, please call your pharmacy*  Lab Work: NONE If you have  labs (blood work) drawn today and your tests are completely normal, you will receive your results only by: [place] (if you have MyChart) OR A paper copy in the mail If you have any lab test that is abnormal or we need to change your treatment, we will call you to review the results.   Testing/Procedures: Your physician has requested that you have a lexiscan myoview. For further information please visit [email or web address]. Please follow instruction sheet, as given. TO BE DONE ON TUESDAYS OR THURSDAY AT 9 AM  Follow-Up: At [facility], you and your health needs are our priority.  As part of our continuing mission to provide you with exceptional heart care, we have created designated Provider Care Teams.  These Care Teams include your primary Cardiologist (physician) and Advanced Practice Providers (APPs -  Physician Assistants and Nurse Practitioners) who all work together to provide you with the care you need, when you need it.  We recommend signing up for the patient portal called "MyChart".  Sign up information is provided on this After Visit Summary.  MyChart is used to connect with patients for Virtual Visits (Telemedicine).  Patients are able to view lab/test results, encounter notes, upcoming appointments, etc.  Non-urgent messages can be sent to your provider as well.   To learn more about what you can do with MyChart, go to [email or web address].    Your next appointment:   1 year(s)  The format for your next appointment:   In Person  Provider:   DR. Marlou Porch  Important Information About Sugar         Signed, Emmaline Life, NP  12/07/2021 12:40 PM    [place]

## 2021-12-07 ENCOUNTER — Encounter: Payer: Self-pay | Admitting: Nurse Practitioner

## 2021-12-07 ENCOUNTER — Ambulatory Visit (INDEPENDENT_AMBULATORY_CARE_PROVIDER_SITE_OTHER): Payer: Medicare PPO | Admitting: Nurse Practitioner

## 2021-12-07 VITALS — BP 110/60 | HR 67 | Ht 63.0 in | Wt 155.0 lb

## 2021-12-07 DIAGNOSIS — I471 Supraventricular tachycardia: Secondary | ICD-10-CM

## 2021-12-07 DIAGNOSIS — Z01818 Encounter for other preprocedural examination: Secondary | ICD-10-CM

## 2021-12-07 DIAGNOSIS — R079 Chest pain, unspecified: Secondary | ICD-10-CM

## 2021-12-07 DIAGNOSIS — E785 Hyperlipidemia, unspecified: Secondary | ICD-10-CM

## 2021-12-07 DIAGNOSIS — I5032 Chronic diastolic (congestive) heart failure: Secondary | ICD-10-CM

## 2021-12-07 NOTE — Patient Instructions (Signed)
Medication Instructions:  Your physician recommends that you continue on your current medications as directed. Please refer to the Current Medication list given to you today.  *If you need a refill on your cardiac medications before your next appointment, please call your pharmacy*  Lab Work: NONE If you have labs (blood work) drawn today and your tests are completely normal, you will receive your results only by: [place] (if you have MyChart) OR A paper copy in the mail If you have any lab test that is abnormal or we need to change your treatment, we will call you to review the results.   Testing/Procedures: Your physician has requested that you have a lexiscan myoview. For further information please visit [email or web address]. Please follow instruction sheet, as given. TO BE DONE ON TUESDAYS OR THURSDAY AT 9 AM  Follow-Up: At [facility], you and your health needs are our priority.  As part of our continuing mission to provide you with exceptional heart care, we have created designated Provider Care Teams.  These Care Teams include your primary Cardiologist (physician) and Advanced Practice Providers (APPs -  Physician Assistants and Nurse Practitioners) who all work together to provide you with the care you need, when you need it.  We recommend signing up for the patient portal called "MyChart".  Sign up information is provided on this After Visit Summary.  MyChart is used to connect with patients for Virtual Visits (Telemedicine).  Patients are able to view lab/test results, encounter notes, upcoming appointments, etc.  Non-urgent messages can be sent to your provider as well.   To learn more about what you can do with MyChart, go to [email or web address].    Your next appointment:   1 year(s)  The format for your next appointment:   In Person  Provider:   DR. Marlou Porch  Important Information About Sugar

## 2021-12-08 ENCOUNTER — Ambulatory Visit: Payer: Medicare PPO

## 2021-12-14 ENCOUNTER — Telehealth (HOSPITAL_COMMUNITY): Payer: Self-pay

## 2021-12-14 NOTE — Telephone Encounter (Signed)
Spoke with the patient, detailed instructions given. She stated that she would be here for her test. Asked to call back with any questions. [name] EMTP 

## 2021-12-17 ENCOUNTER — Ambulatory Visit (HOSPITAL_COMMUNITY): Payer: Medicare PPO | Attending: Internal Medicine

## 2021-12-17 DIAGNOSIS — I10 Essential (primary) hypertension: Secondary | ICD-10-CM | POA: Diagnosis not present

## 2021-12-17 DIAGNOSIS — Z01818 Encounter for other preprocedural examination: Secondary | ICD-10-CM

## 2021-12-17 DIAGNOSIS — R079 Chest pain, unspecified: Secondary | ICD-10-CM | POA: Diagnosis not present

## 2021-12-17 LAB — MYOCARDIAL PERFUSION IMAGING
LV dias vol: 73 mL (ref 46–106)
LV sys vol: 24 mL
Nuc Stress EF: 67 %
Peak HR: 76 {beats}/min
Rest HR: 52 {beats}/min
Rest Nuclear Isotope Dose: 11 mCi
SDS: 2
SRS: 0
SSS: 2
ST Depression (mm): 0 mm
Stress Nuclear Isotope Dose: 30.8 mCi
TID: 1.16

## 2021-12-17 MED ORDER — REGADENOSON 0.4 MG/5ML IV SOLN
0.4000 mg | Freq: Once | INTRAVENOUS | Status: AC
  Administered 2021-12-17: 0.4 mg via INTRAVENOUS

## 2021-12-17 MED ORDER — TECHNETIUM TC 99M TETROFOSMIN IV KIT
30.8000 | PACK | Freq: Once | INTRAVENOUS | Status: AC | PRN
  Administered 2021-12-17: 30.8 via INTRAVENOUS

## 2021-12-17 MED ORDER — TECHNETIUM TC 99M TETROFOSMIN IV KIT
11.0000 | PACK | Freq: Once | INTRAVENOUS | Status: AC | PRN
  Administered 2021-12-17: 11 via INTRAVENOUS

## 2021-12-30 ENCOUNTER — Encounter (HOSPITAL_COMMUNITY): Payer: Self-pay | Admitting: Emergency Medicine

## 2021-12-30 ENCOUNTER — Emergency Department (HOSPITAL_COMMUNITY): Payer: Medicare PPO

## 2021-12-30 ENCOUNTER — Emergency Department (HOSPITAL_COMMUNITY)
Admission: EM | Admit: 2021-12-30 | Discharge: 2021-12-30 | Disposition: A | Discharge status: Home / Self Care | Payer: Medicare PPO | Attending: Emergency Medicine | Admitting: Emergency Medicine

## 2021-12-30 ENCOUNTER — Other Ambulatory Visit: Payer: Self-pay

## 2021-12-30 DIAGNOSIS — S0990XA Unspecified injury of head, initial encounter: Secondary | ICD-10-CM

## 2021-12-30 DIAGNOSIS — S0181XA Laceration without foreign body of other part of head, initial encounter: Secondary | ICD-10-CM | POA: Insufficient documentation

## 2021-12-30 DIAGNOSIS — W01198A Fall on same level from slipping, tripping and stumbling with subsequent striking against other object, initial encounter: Secondary | ICD-10-CM | POA: Diagnosis not present

## 2021-12-30 DIAGNOSIS — W19XXXA Unspecified fall, initial encounter: Secondary | ICD-10-CM

## 2021-12-30 DIAGNOSIS — M542 Cervicalgia: Secondary | ICD-10-CM | POA: Insufficient documentation

## 2021-12-30 NOTE — ED Provider Notes (Signed)
[facility] DEPT Provider Note   CSN: [id number] Arrival date & time: 12/30/21  1050     History  Chief Complaint  Patient presents with   Lytle Michaels    [name] is a 88 y.o. female.  HPI Patient presents via EMS after a fall.  She notes that she was fixing her drapery when her feet got tangled and she fell, striking her head.  Patient has baseline pain in her back, this is seemingly unchanged and she has no new weakness in any extremity.  However, she has pain in her head, face, neck and has an obvious laceration on the left head.  Information obtained by EMS and the patient herself.  She is not on blood thinning medication.    Home Medications Prior to Admission medications   Medication Sig Start Date End Date Taking? Authorizing Provider  acetaminophen (TYLENOL) 325 MG tablet Take 650 mg by mouth every 6 (six) hours as needed.    [provider]  amLODipine (NORVASC) 5 MG tablet Take 1 tablet (5 mg total) by mouth daily. NEEDS APPOINTMENT FOR FUTURE REFILL / 1st Attempt 04/28/20   Richardo Priest, MD  amoxicillin (AMOXIL) 500 MG capsule Take 500 mg by mouth as needed. 4 caps before going to the dent    [provider]  atorvastatin (LIPITOR) 40 MG tablet TAKE 1 TABLET BY MOUTH EVERYDAY AT BEDTIME 10/23/20   Isaac Bliss, Rayford Halsted, MD  Bismuth Subsalicylate (PEPTO-BISMOL PO) Take 1 tablet by mouth daily as needed.     [provider]  Ca [id number] (CALCIUM-FOLIC ACID PLUS D PO) Take by mouth daily.    [provider]  celecoxib (CELEBREX) 200 MG capsule TAKE 1 CAPSULE BY MOUTH EVERY DAY AS NEEDED FOR MODERATE PAIN 12/19/20   Isaac Bliss, Rayford Halsted, MD  CRANBERRY PO Take 1 capsule by mouth daily.    [provider]  dextromethorphan-guaiFENesin (MUCINEX DM) 30-600 MG 12hr tablet Take 1 tablet by mouth 2 (two) times daily as needed for cough.    [provider]  escitalopram  (LEXAPRO) 10 MG tablet TAKE 1 TABLET BY MOUTH EVERY DAY 06/30/21   Isaac Bliss, Rayford Halsted, MD  gabapentin (NEURONTIN) 300 MG capsule Take 1 capsule in AM, 1 capsule at noon, and 2 capsules at bedtime 10/29/21   Cameron Sprang, MD  Liniments (BLUE-EMU SUPER STRENGTH) CREA Apply 1 application topically at bedtime as needed (dry skin).    [provider]  loperamide (IMODIUM A-D) 2 MG tablet Take 2 mg by mouth 4 (four) times daily as needed for diarrhea or loose stools.    [provider]  loratadine (CLARITIN) 10 MG tablet TAKE 1 TABLET BY MOUTH EVERY DAY 02/13/20   Tanda Rockers, MD  losartan (COZAAR) 50 MG tablet Take 50 mg by mouth daily.    [provider]  meloxicam (MOBIC) 7.5 MG tablet Take one tab daily as neede 03/25/20   Isaac Bliss, Rayford Halsted, MD  Menthol, Topical Analgesic, (BLUE-EMU MAXIMUM STRENGTH EX) Apply topically. Apply topically as needed.    [provider]  Multiple Vitamins-Minerals (CENTRUM SILVER 50+WOMEN) TABS take 1 tablet by oral route every day    [provider]  NON FORMULARY Take 2 capsules by mouth 2 (two) times daily. [facility]    [provider]  olopatadine (PATANOL) 0.1 % ophthalmic solution Place 1 drop into both eyes 2 (two) times daily as needed for allergies. 10/05/18  [provider]  oxyCODONE-acetaminophen (PERCOCET) 5-325 MG tablet Take 1 tablet by mouth every 8 (eight) hours as needed for severe pain. 10/23/20   Milton Ferguson, MD  pantoprazole (PROTONIX) 40 MG tablet TAKE 1 TABLET (40 MG TOTAL) BY MOUTH DAILY. TAKE 30-60 MIN BEFORE FIRST MEAL OF THE DAY 11/04/20   Tanda Rockers, MD  Phenylephrine-Acetaminophen (SINUS PRESSURE + PAIN) 5-325 MG TABS Take by mouth as directed.    [provider]  Potassium Chloride ER 20 MEQ TBCR Take 1 tablet by mouth daily. 01/15/21   Isaac Bliss, Rayford Halsted, MD  Probiotic Product (PROBIOTIC DAILY PO) Take 1 capsule by mouth daily.    [provider]  tiZANidine (ZANAFLEX) 2 MG tablet Take 1 tablet (2 mg total) by mouth in the morning and at bedtime. 06/29/21   Rondel Jumbo, PA-C  traMADol (ULTRAM) 50 MG tablet Take 50 mg by mouth every 6 (six) hours as needed. 10/27/21   [provider]  trimethoprim (TRIMPEX) 100 MG tablet Take 100 mg by mouth daily.    [provider]      Allergies    Amitriptyline hcl, Carisoprodol, Codeine, Fosamax [alendronate sodium], Meloxicam, Metronidazole, Nortriptyline hcl, Oxaprozin, Promethazine hcl, Propranolol, Rofecoxib, Sumatriptan, Tramadol, and Venlafaxine    Review of Systems   Review of Systems  All other systems reviewed and are negative.   Physical Exam Updated Vital Signs BP (!) 147/68 (BP Location: Left Arm)   Pulse 72   Temp 98 F (36.7 C) (Oral)   Resp 15   SpO2 96%  Physical Exam Vitals and nursing note reviewed.  Constitutional:      General: She is not in acute distress.    Appearance: She is well-developed.  HENT:     Head: Normocephalic.   Eyes:     Conjunctiva/sclera: Conjunctivae normal.  Neck:     Comments: Cervical collar in place, no obvious deformity, step-off, crepitus. Cardiovascular:     Rate and Rhythm: Normal rate and regular rhythm.  Pulmonary:     Effort: Pulmonary effort is normal. No respiratory distress.     Breath sounds: Normal breath sounds. No stridor.  Abdominal:     General: There is no distension.  Skin:    General: Skin is warm and dry.  Neurological:     Mental Status: She is alert and oriented to person, place, and time.     Cranial Nerves: No cranial nerve deficit.     Comments: Age-appropriate atrophy otherwise neuro exam unremarkable.  Psychiatric:        Mood and Affect: Mood normal.     ED Results / Procedures / Treatments   Labs (all labs ordered are listed, but only abnormal results are displayed) Labs Reviewed - No data to display  EKG None  Radiology CT Maxillofacial WO CM  Result  Date: 12/30/2021 CLINICAL DATA:  [place] today. EXAM: CT MAXILLOFACIAL WITHOUT CONTRAST TECHNIQUE: Multidetector CT imaging of the maxillofacial structures was performed. Multiplanar CT image reconstructions were also generated. RADIATION DOSE REDUCTION: This exam was performed according to the departmental dose-optimization program which includes automated exposure control, adjustment of the mA and/or kV according to patient size and/or use of iterative reconstruction technique. COMPARISON:  None FINDINGS: Osseous: . no acute facial fracture. Orbits: No orbital acute injury. Chronic scleral banding on the left. Sinuses: Clear Soft tissues: Soft tissue injury at the left lateral orbital region. Limited intracranial: Negative IMPRESSION: No facial fracture. Soft tissue injury lateral to the left  orbit. No acute intraorbital injury. Electronically Signed   By: Nelson Chimes M.D.   On: 12/30/2021 11:48   CT Head Wo Contrast  Result Date: 12/30/2021 CLINICAL DATA:  Fall today with trauma to the head. EXAM: CT HEAD WITHOUT CONTRAST TECHNIQUE: Contiguous axial images were obtained from the base of the skull through the vertex without intravenous contrast. RADIATION DOSE REDUCTION: This exam was performed according to the departmental dose-optimization program which includes automated exposure control, adjustment of the mA and/or kV according to patient size and/or use of iterative reconstruction technique. COMPARISON:  05/01/2021 FINDINGS: Brain: Age related volume loss. Mild chronic small-vessel ischemic change of the white matter. No evidence of acute infarction, mass lesion, hemorrhage, hydrocephalus or extra-axial collection. Vascular: There is atherosclerotic calcification of the major vessels at the base of the brain. Skull: No skull fracture. Sinuses/Orbits: Clear/normal Other: None IMPRESSION: No acute or traumatic finding.  Age related volume loss. Electronically Signed   By: Nelson Chimes M.D.   On: 12/30/2021  11:46   CT Cervical Spine Wo Contrast  Result Date: 12/30/2021 CLINICAL DATA:  [place] today with trauma to the head EXAM: CT CERVICAL SPINE WITHOUT CONTRAST TECHNIQUE: Multidetector CT imaging of the cervical spine was performed without intravenous contrast. Multiplanar CT image reconstructions were also generated. RADIATION DOSE REDUCTION: This exam was performed according to the departmental dose-optimization program which includes automated exposure control, adjustment of the mA and/or kV according to patient size and/or use of iterative reconstruction technique. COMPARISON:  10/22/2020 FINDINGS: Alignment: Kyphotic curvature throughout the cervical region. No traumatic malalignment. Degenerative translation towards the right of C1 relative to C2. These findings are unchanged. Skull base and vertebrae: No acute traumatic finding. Soft tissues and spinal canal: No traumatic finding. Disc levels: Pronounced chronic arthropathy at the C1-2 articulation, possibly CPPD related. C2-3: Chronic facet arthropathy with minimal degenerative anterolisthesis. C3-4: Chronic ankylosis.  No stenosis. C4-5: Chronic ankylosis.  No stenosis. C4-5: Disc degeneration with a minimal disc bulge. Mild facet osteoarthritis. No compressive stenosis. C6-7: Facet osteoarthritis with 1 mm of anterolisthesis. No compressive stenosis. C7-T1: Minimal disc degeneration.  No stenosis. Upper chest: Negative Other: None IMPRESSION: No acute or traumatic finding. Extensive chronic degenerative changes throughout the cervical region as above, possibly CPPD related. Kyphosis throughout and degenerative malalignment in the upper cervical spine as seen previously. Electronically Signed   By: Nelson Chimes M.D.   On: 12/30/2021 11:45    Procedures Procedures  LACERATION REPAIR Performed by: Carmin Muskrat Authorized by: Carmin Muskrat Consent: Verbal consent obtained. Risks and benefits: risks, benefits and alternatives were discussed Consent  given by: patient Patient identity confirmed: provided demographic data Prepped and Draped in normal sterile fashion Wound explored  Laceration Location: L face / lateral brow  Laceration Length: 4cm  No Foreign Bodies seen or palpated   Irrigation method: syringe Amount of cleaning: standard  Skin closure: dermabond  Number of sutures: one tube  Technique: close  Patient tolerance: Patient tolerated the procedure well with no immediate complications.  Medications Ordered in ED Medications - No data to display  ED Course/ Medical Decision Making/ A&P This patient with a Hx of multiple medical issues including chronic back pain from multiple prior surgeries, presents after mechanical fall.  this involves an extensive number of treatment options, and is a complaint that carries with it a high risk of complications and morbidity.    The differential diagnosis includes intracranial hemorrhage, skull fracture, patient will fracture, cervical spine fracture, hematoma  Social Determinants of Health:  Advanced age  Additional history obtained:  Additional history and/or information obtained from chart review and EMS, notable for discussed the patient's case meth on arrival, discussed her fall, findings, vital signs unremarkable in route   After the initial evaluation, orders, including: CT head, neck, face were initiated.   The patient was also maintained on pulse oximetry. The readings were typically 99% room air   On repeat evaluation of the patient improved   Imaging Studies ordered:  I independently visualized and interpreted imaging which showed no intracranial hemorrhage, no skull fracture neck fracture, facial bone fracture I agree with the radiologist interpretation   Dispostion / Final MDM:  After consideration of the diagnostic results and the patient's response to treatment, patient presenting for fall with pain in multiple areas, open wound on her face,  is otherwise neurologically unremarkable, hemodynamically unremarkable, has reassuring radiographic studies, no evidence of bacteremia, sepsis, indication for additional labs.  Patient notes chronic back pain for multiple prior surgeries, denies changes in his regard, and as above, without neurodeficits, with successful laceration repair patient appropriate for discharge with outpatient follow-up.  Final Clinical Impression(s) / ED Diagnoses Final diagnoses:  Fall, initial encounter  Injury of head, initial encounter  Facial laceration, initial encounter     Carmin Muskrat, MD 12/30/21 1229

## 2021-12-30 NOTE — Discharge Instructions (Addendum)
As discussed, your evaluation today has been largely reassuring.  But, it is important that you monitor your condition carefully, and do not hesitate to return to the ED if you develop new, or concerning changes in your condition.  Otherwise, please follow-up with your physician for appropriate ongoing care.  The Dermabond, or tissue adhesive used to close your facial laceration will fall off in the next 7 to 10 days.  You can wash your face, wash her hair, please do not use abrasive or any other particular detergent on the glue itself.

## 2021-12-30 NOTE — ED Notes (Signed)
[facility] called for transport need, [facility] stated 21 people ahead of pt.

## 2021-12-30 NOTE — ED Notes (Signed)
Pt given belongings back before being transported by safe transport. Pt has denture cup w/ necklace and bobby pins.

## 2021-12-30 NOTE — ED Notes (Signed)
[facility] called for transport needs.

## 2021-12-30 NOTE — ED Triage Notes (Signed)
BIBA Per EMS: Pt coming from [place] w/ c/o mechanical fall today. Pt did hit head. Denies LOC, denies thinners.  VSS

## 2022-01-07 ENCOUNTER — Ambulatory Visit: Payer: Medicare PPO

## 2022-03-03 ENCOUNTER — Other Ambulatory Visit: Payer: Self-pay | Admitting: Physician Assistant

## 2022-04-29 ENCOUNTER — Ambulatory Visit
Admission: RE | Admit: 2022-04-29 | Discharge: 2022-04-29 | Disposition: A | Discharge status: Home / Self Care | Payer: Medicare PPO | Source: Ambulatory Visit | Attending: Nurse Practitioner | Admitting: Nurse Practitioner

## 2022-04-29 DIAGNOSIS — Z1231 Encounter for screening mammogram for malignant neoplasm of breast: Secondary | ICD-10-CM

## 2022-05-19 ENCOUNTER — Other Ambulatory Visit: Payer: Self-pay | Admitting: Physician Assistant

## 2022-06-01 ENCOUNTER — Ambulatory Visit: Payer: Medicare PPO | Admitting: Neurology

## 2022-06-11 ENCOUNTER — Ambulatory Visit: Payer: Medicare PPO | Admitting: Neurology

## 2022-07-13 ENCOUNTER — Ambulatory Visit: Payer: Medicare PPO | Admitting: Neurology

## 2022-07-22 ENCOUNTER — Emergency Department (HOSPITAL_COMMUNITY)
Admission: EM | Admit: 2022-07-22 | Discharge: 2022-07-22 | Disposition: A | Discharge status: Home / Self Care | Payer: Medicare PPO | Attending: Emergency Medicine | Admitting: Emergency Medicine

## 2022-07-22 ENCOUNTER — Encounter (HOSPITAL_COMMUNITY): Payer: Self-pay

## 2022-07-22 ENCOUNTER — Other Ambulatory Visit: Payer: Self-pay

## 2022-07-22 DIAGNOSIS — R4182 Altered mental status, unspecified: Secondary | ICD-10-CM | POA: Diagnosis present

## 2022-07-22 DIAGNOSIS — F0390 Unspecified dementia without behavioral disturbance: Secondary | ICD-10-CM | POA: Insufficient documentation

## 2022-07-22 LAB — BASIC METABOLIC PANEL
Anion gap: 10 (ref 5–15)
BUN: 13 mg/dL (ref 8–23)
CO2: 24 mmol/L (ref 22–32)
Calcium: 9.3 mg/dL (ref 8.9–10.3)
Chloride: 100 mmol/L (ref 98–111)
Creatinine, Ser: 0.78 mg/dL (ref 0.44–1.00)
GFR, Estimated: >60 mL/min (ref >60)
Glucose, Bld: 118 mg/dL — ABNORMAL HIGH (ref 70–99)
Potassium: 3.6 mmol/L (ref 3.5–5.1)
Sodium: 134 mmol/L — ABNORMAL LOW (ref 135–145)

## 2022-07-22 LAB — CBC WITH DIFFERENTIAL/PLATELET
Abs Immature Granulocytes: 0.03 10*3/uL (ref 0.00–0.07)
Basophils Absolute: 0.1 10*3/uL (ref 0.0–0.1)
Basophils Relative: 1 %
Eosinophils Absolute: 0.2 10*3/uL (ref 0.0–0.5)
Eosinophils Relative: 3 %
HCT: 37.2 % (ref 36.0–46.0)
Hemoglobin: 12.1 g/dL (ref 12.0–15.0)
Immature Granulocytes: 0 %
Lymphocytes Relative: 15 %
Lymphs Abs: 1.1 10*3/uL (ref 0.7–4.0)
MCH: 30.5 pg (ref 26.0–34.0)
MCHC: 32.5 g/dL (ref 30.0–36.0)
MCV: 93.7 fL (ref 80.0–100.0)
Monocytes Absolute: 1 10*3/uL (ref 0.1–1.0)
Monocytes Relative: 13 %
Neutro Abs: 5.3 10*3/uL (ref 1.7–7.7)
Neutrophils Relative %: 68 %
Platelets: 289 10*3/uL (ref 150–400)
RBC: 3.97 MIL/uL (ref 3.87–5.11)
RDW: 13.8 % (ref 11.5–15.5)
WBC: 7.7 10*3/uL (ref 4.0–10.5)
nRBC: 0 % (ref 0.0–0.2)

## 2022-07-22 NOTE — ED Provider Notes (Signed)
[facility] AT [facility] Provider Note   CSN: [id number] Arrival date & time: 07/22/22  1959     History Chief Complaint  Patient presents with   Altered Mental Status    HPI [name] is a 89 y.o. female presenting for episode of confusion at her facility earlier today.  She lives at a skilled nursing facility because of cognitive impairment and a host of comorbid medical problems. She has no complaints at this time.  Chronic left knee swelling that is causing some discomfort.  She states that she otherwise feels fine with like to go back to her facility soon as possible. Only complaint per patient was that she "did not look well tonight "at her facility. She denies any concerns including fevers or chills, nausea vomiting, syncope shortness of breath.   Patient's recorded medical, surgical, social, medication list and allergies were reviewed in the Snapshot window as part of the initial history.   Review of Systems   Review of Systems  Unable to perform ROS: Dementia    Physical Exam Updated Vital Signs BP (!) 161/102 (BP Location: Left Arm)   Pulse 75   Temp 99.5 F (37.5 C) (Oral)   Resp 18   Ht 5\' 3"  (1.6 m)   Wt 70.3 kg   SpO2 96%   BMI 27.45 kg/m  Physical Exam Vitals and nursing note reviewed.  Constitutional:      General: She is not in acute distress.    Appearance: She is well-developed.  HENT:     Head: Normocephalic and atraumatic.  Eyes:     Conjunctiva/sclera: Conjunctivae normal.  Cardiovascular:     Rate and Rhythm: Normal rate and regular rhythm.     Heart sounds: No murmur heard. Pulmonary:     Effort: Pulmonary effort is normal. No respiratory distress.     Breath sounds: Normal breath sounds.  Abdominal:     General: There is no distension.     Palpations: Abdomen is soft.     Tenderness: There is no abdominal tenderness. There is no right CVA tenderness or left CVA tenderness.   Musculoskeletal:        General: No swelling or tenderness. Normal range of motion.     Cervical back: Neck supple.  Skin:    General: Skin is warm and dry.  Neurological:     General: No focal deficit present.     Mental Status: She is alert and oriented to person, place, and time. Mental status is at baseline.     Cranial Nerves: No cranial nerve deficit.      ED Course/ Medical Decision Making/ A&P Clinical Course as of 07/22/22 2254  Thu Jul 22, 2022  2030 Facility  [CC]    Clinical Course User Index [CC] Tretha Sciara, MD    Procedures Procedures   Medications Ordered in ED Medications - No data to display Medical Decision Making:   [name] is a 89 y.o. female who presented to the ED today with altered mental status detailed above.    Handoff received from EMS.  Additional history discussed with patient's family/caregivers.  Patient placed on continuous vitals and telemetry monitoring while in ED which was reviewed periodically.  Complete initial physical exam performed, notably the patient  was HDS in NAD.    Reviewed and confirmed nursing documentation for past medical history, family history, social history.    Initial Assessment:   With the patient's presentation of altered mental  status, most likely diagnosis is delerium 2/2 infectious etiology (UTI/CAP/URI) vs metabolic abnormality (Na/K/Mg/Ca) vs nonspecific etiology. Other diagnoses were considered including (but not limited to) CVA, ICH, intracranial mass, critical dehydration, heptatic dysfunction, uremia, hypercarbia, intoxication, endrocrine abnormality, toxidrome. These are considered less likely due to history of present illness and physical exam findings.   This is most consistent with an acute life/limb threatening illness complicated by underlying chronic conditions.  Initial Plan:  Screening labs including CBC and Metabolic panel to evaluate for infectious or metabolic etiology of  disease.  Objective evaluation as below reviewed   Initial Study Results:   Laboratory  All laboratory results reviewed without evidence of clinically relevant pathology.   Final Assessment and Plan:   Long conversation with patient and son.  Screening labs all ordered reviewed and grossly benign.  Given reassuring objective evaluation, patient's restoration of normal mental status and normal vital signs, I believe is reasonable for patient to follow-up in the outpatient setting with her PCP without acute indication for further intervention in the emergency room. Disposition:  I have considered need for hospitalization, however, considering all of the above, I believe this patient is stable for discharge at this time.  Patient/family educated about specific return precautions for given chief complaint and symptoms.  Patient/family educated about follow-up with PCP.     Patient/family expressed understanding of return precautions and need for follow-up. Patient spoken to regarding all imaging and laboratory results and appropriate follow up for these results. All education provided in verbal form with additional information in written form. Time was allowed for answering of patient questions. Patient discharged.    Emergency Department Medication Summary:   Medications - No data to display         Clinical Impression:  1. Altered mental status, unspecified altered mental status type      Discharge   Final Clinical Impression(s) / ED Diagnoses Final diagnoses:  Altered mental status, unspecified altered mental status type    Rx / DC Orders ED Discharge Orders     None         Tretha Sciara, MD 07/22/22 2254

## 2022-07-22 NOTE — ED Triage Notes (Signed)
Patient has hx of dementia. Per EMS patient sent to ER for altered mental status and "not acting right" per assisted living facility staff. Per EMS patient c/o knee. Patient is alert and able to answer triage questions appropriately. Patient states staff said she did not look good and sent her to the ER. Patient c/o left knee pain and states she gets injections to knee. Patient reports no other issues at this time

## 2022-07-22 NOTE — ED Notes (Signed)
Spoke with Harrie Jeans at [facility] notified of patient discharge from ER back to facility

## 2022-08-10 ENCOUNTER — Ambulatory Visit: Payer: Medicare PPO | Admitting: Family Medicine

## 2022-09-02 ENCOUNTER — Other Ambulatory Visit: Payer: Self-pay | Admitting: Physician Assistant

## 2022-09-14 ENCOUNTER — Ambulatory Visit: Payer: Medicare PPO | Admitting: Family Medicine

## 2022-10-06 ENCOUNTER — Other Ambulatory Visit: Payer: Self-pay | Admitting: Neurology

## 2022-10-14 ENCOUNTER — Other Ambulatory Visit (INDEPENDENT_AMBULATORY_CARE_PROVIDER_SITE_OTHER): Payer: Medicare PPO

## 2022-10-14 ENCOUNTER — Ambulatory Visit (INDEPENDENT_AMBULATORY_CARE_PROVIDER_SITE_OTHER): Payer: Medicare PPO | Admitting: Neurology

## 2022-10-14 ENCOUNTER — Encounter: Payer: Self-pay | Admitting: Neurology

## 2022-10-14 VITALS — BP 119/59 | HR 78 | Ht 63.0 in | Wt 154.6 lb

## 2022-10-14 DIAGNOSIS — R413 Other amnesia: Secondary | ICD-10-CM | POA: Diagnosis not present

## 2022-10-14 DIAGNOSIS — G44321 Chronic post-traumatic headache, intractable: Secondary | ICD-10-CM | POA: Diagnosis not present

## 2022-10-14 LAB — CBC
HCT: 38.9 % (ref 36.0–46.0)
Hemoglobin: 12.9 g/dL (ref 12.0–15.0)
MCHC: 33.1 g/dL (ref 30.0–36.0)
MCV: 94.5 fl (ref 78.0–100.0)
Platelets: 325 10*3/uL (ref 150.0–400.0)
RBC: 4.12 Mil/uL (ref 3.87–5.11)
RDW: 14.1 % (ref 11.5–15.5)
WBC: 7.5 10*3/uL (ref 4.0–10.5)

## 2022-10-14 LAB — COMPREHENSIVE METABOLIC PANEL WITH GFR
ALT: 6 U/L (ref 0–35)
AST: 17 U/L (ref 0–37)
Albumin: 4.1 g/dL (ref 3.5–5.2)
Alkaline Phosphatase: 91 U/L (ref 39–117)
BUN: 16 mg/dL (ref 6–23)
CO2: 28 meq/L (ref 19–32)
Calcium: 10 mg/dL (ref 8.4–10.5)
Chloride: 103 meq/L (ref 96–112)
Creatinine, Ser: 0.81 mg/dL (ref 0.40–1.20)
GFR: 63.78 mL/min
Glucose, Bld: 88 mg/dL (ref 70–99)
Potassium: 4.2 meq/L (ref 3.5–5.1)
Sodium: 138 meq/L (ref 135–145)
Total Bilirubin: 0.7 mg/dL (ref 0.2–1.2)
Total Protein: 6.7 g/dL (ref 6.0–8.3)

## 2022-10-14 LAB — URINALYSIS, ROUTINE W REFLEX MICROSCOPIC
Bilirubin Urine: NEGATIVE
Hgb urine dipstick: NEGATIVE
Ketones, ur: NEGATIVE
Nitrite: POSITIVE — AB
RBC / HPF: NONE SEEN (ref 0–?)
Specific Gravity, Urine: 1.015 (ref 1.000–1.030)
Total Protein, Urine: NEGATIVE
Urine Glucose: NEGATIVE
Urobilinogen, UA: 1 (ref 0.0–1.0)
pH: 6.5 (ref 5.0–8.0)

## 2022-10-14 LAB — TSH: TSH: 2.12 u[IU]/mL (ref 0.35–5.50)

## 2022-10-14 LAB — VITAMIN B12: Vitamin B-12: 167 pg/mL — ABNORMAL LOW (ref 211–911)

## 2022-10-14 NOTE — Progress Notes (Signed)
NEUROLOGY FOLLOW UP OFFICE NOTE  [name] [id number] [birth year]  HISTORY OF PRESENT ILLNESS: I had the pleasure of seeing [name] in follow-up in the neurology clinic on 10/14/2022.  The patient was last seen almost a year ago for memory loss and headaches. She is accompanied by her niece [name] who helps supplement the history today.  Records and images were personally reviewed where available.  She states she is not doing so good, indicating her left knee hurts her so bad. She had fallen backwards 2 years ago and still has headaches with pain going down her neck. She has had injections and also reports her back hurts a lot. Main concern today is worsening memory. [name] reports that she had last seen Pat in November and was noticing memory changes at that time. In February, it was found that one of her caregivers was stealing her money, then recently caregivers have reported to [facility] that they are worried, there has been a tremendous downward spiral. She lives in Independent Living at [facility]. She has an aide who comes for a few hours a day fixing her pillpacks then calls her to ask if she took her medications. [name] notes she forgets conversations from 5 minutes prior. When [name] came last week, she saw pieces of paper showing account was overdrawn, apparently her prior bank account was closed but Social Security was not informed so checks were still going to the old account. Her mortgage and payment to caregivers were overdrawn for 2 months. They are currently working with the bank to fix it. [name] has also found a lot of money given to political parties. Her [employer] account was hacked. She does not drive. Staff has to take her to eat at the dining hall. She has been having hallucinations recently, thinking her parents were alive. She also has auditory hallucinations, hearing knocking at her door. She woke up at 3am saying someone was knocking, [name] was staying over  and did not hear anything. This morning she was talking in her sleep, pulling her cover up. [name] reports the day before yesterday, she was in the hallway and had a meltdown saying "help me," she does not remember this when asked today. She feels symptoms have been worse in the past month due to her knee problems. She is estranged from her son, she had a fall in March and apparently when her son was called, "he washed his hands" of her.  They do not have her medication list, her pharmacy was called, she is on Gabapentin 300mg  in AM, 300mg  at noon, 600mg  at bedtime. She has a bottle of Tramadol 50mg  and says she has been taking 2 tablets twice a day (100mg  BID).    History on Initial Assessment 11/13/2019: This is a pleasant 89 year old right-handed woman with a history of hypertension, hyperlipidemia, SVT, migraines, anxiety, depression, presenting for evaluation of memory loss. She lives at [facility] and has been going to [facility] for her memory and falls and feels better. She states her memory is not as good as she wishes. She started noticing memory changes around 5 years ago, she would forget where her fridge was while she was in the kitchen. She continues to manage her medications and finances and denies any issues. She states her son wanted her to turn it over to him but she refused, "he disclaimed me and is not calling me anymore." She does not have a car. She reports he would not take  her to the closing of her home and he told some of her doctors one day that he could say she was failing and he needed to take charge of her health, then told the managers at [facility] that he was through with her. She moved to [place] in December 2020. She has a sister-in-law and nephew who keep an eye on her and call her every few days. Her niece in [place] comes once a year, they talk on the phone and has told her she could not remember some things. She reports troubling hallucinations, but she  figured out herself that it was the Propranolol and after stopping it several months ago, the hallucinations stopped. She was having auditory>visual hallucinations, hearing people in the living room, she thought she had been in a meeting with some of them. One night she needed to use the bathroom and saw 2 women sitting on a bench at the foot of her bed talking to each other. There is a family history of dementia in her younger deceased brother, maternal aunt, and cousin.    She has occasional migraines. She has left hip pain. She fell in 2019 and continues to have pain in the back of her head and neck. She denies any dizziness, diplopia, dysarthria/dysphagia, focal numbness/tingling/weakness, bowel/bladder dysfunction, anosmia, or tremors. She has done PT at 3 different places, working on her balance. She uses her walker or cane most of the time, but comes today with no assistive devices. No recent falls.    I personally reviewed head CT without contrast done 09/2018 with no acute changes, there was age-related diffuse atrophy and chronic microvascular disease.     Laboratory Data:      Lab Results  Component Value Date    TSH 2.15 09/27/2019         Lab Results  Component Value Date    VITAMINB12 256 11/23/2018    CT head 05/01/21 No acute intracranial findings.  No change from prior.  Atrophy and white matter microvascular disease.   PAST MEDICAL HISTORY: Past Medical History:  Diagnosis Date   Anxiety and depression    Back pain    lumbar   Fecal incontinence    Dr. Bosie Clos, likely due to loose stools in conjunction with pelvic muscle laxity from aging, recommended to stop Miralax   GI (gastrointestinal bleed)    hx of colon polyps 2004, diverticulosis, internal hemmorhids   Headache    Headache(784.0)    Hydronephrosis    Right 2008 s/p urology eval CT/cysto: observation/monitor   Hyperlipidemia    Hypertension    Knee pain    right chronic   Migraine    on topamax , per  neuro   Osteoporosis    Pulmonary nodules    [place]. First seen by CT only 05/04/03, no change on f/u 08/08/06 -Macroscopic changes rml 04/27/07 > resolved april 6,2010 no further w/u    Seborrheic keratosis    Left inferior posterior flank   SVT (supraventricular tachycardia)    sees Dr.Ross    MEDICATIONS: Current Outpatient Medications on File Prior to Visit  Medication Sig Dispense Refill   acetaminophen (TYLENOL) 325 MG tablet Take 650 mg by mouth every 6 (six) hours as needed.     escitalopram (LEXAPRO) 10 MG tablet TAKE 1 TABLET BY MOUTH EVERY DAY 90 tablet 1   gabapentin (NEURONTIN) 300 MG capsule TAKE 1 CAPSULE BY MOUTH 2 TIMES DAILY in the morning and at noon and TAKE 2 CAPSULES  AT BEDTIME 120 capsule 0   losartan (COZAAR) 50 MG tablet Take 50 mg by mouth daily.     Multiple Vitamins-Minerals (CENTRUM SILVER 50+WOMEN) TABS take 1 tablet by oral route every day     tiZANidine (ZANAFLEX) 2 MG tablet TAKE 1 TABLET BY MOUTH 2 TIMES DAILY IN THE MORNING AND AT BEDTIME 60 tablet 2   TRAMADOL HCL PO Take 50 mg by mouth as needed. Take 2 tabs BID daily and one at noon as needed     No current facility-administered medications on file prior to visit.    ALLERGIES: Allergies  Allergen Reactions   Amitriptyline Hcl     REACTION: hallucinations   Carisoprodol     REACTION: nightmares,rash,tachycardia   Codeine     REACTION: "deathly ill" nauesa, dizzy,weak   Fosamax [Alendronate Sodium] Other (See Comments)    Dental issues   Meloxicam     REACTION: sensitive to sun; facial redness; may cause severe abd pain   Metronidazole     REACTION: abd pain   Nortriptyline Hcl     REACTION: hallucinations   Oxaprozin     REACTION: abd pain   Promethazine Hcl     REACTION: spastic arms and legs flailing while awake and unusual behavior while sleep - walking all over house during night   Propranolol Other (See Comments)    hallucinations   Rofecoxib     REACTION: rash   Sumatriptan      REACTION: abdominal pain, blurry vision, drowsiness   Tramadol Other (See Comments)    Dizziness and constipation   Venlafaxine     Hallucinations     FAMILY HISTORY: Family History  Problem Relation Age of Onset   Breast cancer Mother        later in life   Diabetes Mother        late in life   Heart disease Mother        late onset   Lung cancer Father        apparently had TB   Heart disease Father        late onset   Prostate cancer Father    Colitis Brother    Cancer Brother    Colon cancer Neg Hx     SOCIAL HISTORY: Social History   Socioeconomic History   Marital status: Widowed    Spouse name: Not on file   Number of children: 1   Years of education: college    Highest education level: Doctorate  Occupational History   Occupation: retired   Tobacco Use   Smoking status: Never   Smokeless tobacco: Never  Vaping Use   Vaping Use: Never used  Substance and Sexual Activity   Alcohol use: Yes    Alcohol/week: 1.0 standard drink of alcohol    Types: 1 Glasses of wine per week    Comment: rarely, 1 glass of wine monthly   Drug use: No   Sexual activity: Not Currently  Other Topics Concern   Not on file  Social History Narrative   Moved from a retirement home to a condominium 04-2013, independent living,  lives by herself , drives    Son lives in [place]   Right handed    1 cup of caffeine per day       Lives in 3 story home but resides on the 2nd floor    Social Determinants of Health   Financial Resource Strain: Not on file  Food Insecurity: Not on  file  Transportation Needs: Not on file  Physical Activity: Not on file  Stress: Not on file  Social Connections: Not on file  Intimate Partner Violence: Not on file     PHYSICAL EXAM: Vitals:   10/14/22 1041  BP: (!) 119/59  Pulse: 78  SpO2: 95%   General: No acute distress. She falls asleep when not stimulated.  Head:  Normocephalic/atraumatic, head is bent to the right Skin/Extremities:  No rash, no edema Neurological Exam: alert and oriented to person, place. No aphasia or dysarthria. Fund of knowledge is reduced.  Recent and remote memory areimpaired. Attention and concentration are normal.  MMSE 23/30    10/14/2022   11:00 AM 11/23/2018    1:31 PM 12/06/2017    3:29 PM  MMSE - Mini Mental State Exam  Orientation to time 2 4 5   Orientation to Place 4 5 5   Registration 3 3 3   Attention/ Calculation 5 5 5   Recall 0 3 3  Language- name 2 objects 2 2 2   Language- repeat 1 1 1   Language- follow 3 step command 3 3 3   Language- read & follow direction 1 1 1   Write a sentence 1 1 1   Copy design 1 1 1   Total score 23 29 30    Cranial nerves: Pupils equal, round. Extraocular movements intact with no nystagmus. Visual fields full.  No facial asymmetry.  Motor: Bulk and tone normal, muscle strength 5/5 throughout with no pronator drift.   Finger to nose testing intact.  Gait slow and cautious reporting left knee pain, no ataxia. No tremors.    IMPRESSION: This is a pleasant 89 yo RH woman with a history of hypertension, hyperlipidemia, SVT, migraines, anxiety, depression, who presented in 2021 for memory loss. Neuropsychological evaluation in 12/2019 indicated Mild Cognitive Impairment, with no indication of a neurodegenerative process/dementia. She comes with her niece today raising concern about significant changes in cognition, with more difficulties with complex tasks (ie finances), as well as hallucinations. She does not have a PCP. Check CBC, CMP, urinalysis, TSH, B12 for reversible causes of memory changes. MRI brain without contrast will be ordered to assess for underlying structural abnormality. It appears she is taking a significant amount of Tramadol (100mg  BID) which may be contributing to mental status. She was advised to minimize to once a day, if needed, for headache, continue Gabapentin 300-300-600mg  for headache prophylaxis. She was advised to establish care with a  geriatrician. We also had an extensive discussion about advanced directives and looking into higher level of care, establishing a POA. Continue close supervision. Follow-up in 3 months, call for any changes.      Thank you for allowing me to participate in her care.  Please do not hesitate to call for any questions or concerns.    Patrcia Dolly, M.D.   CC: Dr. Fredric Dine

## 2022-10-14 NOTE — Patient Instructions (Addendum)
Good to see you.  Have bloodwork done for CBC, CMP, TSH, vitamin B12, urinalysis  2. Schedule MRI brain without contrast  3. Reduce Tramadol to 1 tablet daily for headache  4. Continue Gabapentin 300mg : take 1 capsule in AM, 1 capsule at noon, 2 capsules at night  5. Schedule an appointment with new family doctor/geriatrician:  6. Please contact Elder care: As with all capable adults, I would recommend that you consult with an elder care attorney regarding advanced directives if you have not, such as a healthcare power of attorney and living will. Consultation with an elder care attorney can help you protect your estate and communicate your preferences to your loved ones formally, in the event you are not able to do so yourself.   Elder [employer] investment banker  [address] in [place], [address]. They can be reached at [phone]. They also have a website [email or web address].    6. Follow-up in 3 months, call for any changes

## 2022-10-15 ENCOUNTER — Telehealth: Payer: Self-pay

## 2022-10-15 NOTE — Telephone Encounter (Signed)
Pt niece came to the office she was informed that bloodwork was overall normal except for her B12 level. Usually when it is that low, we recommend B12 injections. Pls check if staff can do it at [facility]. Also, her urine sample was dirty, it may be from collection. Can you pls ask if she has any problems urinating, any difficulty urinating, burning or foul-smelling urine? Any fever? If these start to occur, she should go to Urgent Care.. pt niece said that we need to find the pt help or she is washing her hands with her. She said that she doesn't think she can handle the shot and she is going to buy the highest over the counter pills she can find

## 2022-10-15 NOTE — Telephone Encounter (Signed)
-----   Message from Van Clines, MD sent at 10/15/2022  8:28 AM EDT ----- Can you pls call patient and let her know the bloodwork was overall normal except for her B12 level. Usually when it is that low, we recommend B12 injections. Pls check if staff can do it at [facility]. Also, her urine sample was dirty, it may be from collection. Can you pls ask if she has any problems urinating, any difficulty urinating, burning or foul-smelling urine? Any fever? If these start to occur, she should go to Urgent Care.

## 2022-10-18 NOTE — Telephone Encounter (Signed)
Ethel at [phone] would like to speak to someone about what is going with patient. She came in Friday and spoke with Herbert Seta

## 2022-10-18 NOTE — Telephone Encounter (Signed)
Recommend going to Urgent care/ER at this point, they may need to repeat the urinalysis if UTI is causing these changes.

## 2022-10-18 NOTE — Telephone Encounter (Signed)
Pt POA called advised that Dr Karel Jarvis stated Recommend going to Urgent care/ER at this point, they may need to repeat the urinalysis if UTI is causing these changes. She said that she was in [place] and not near her or close to her she would have to call her care givers and started to yell at me I told her to NOT yell at me, she said that if that is all I had to say then she guess she would just see [place] at [facility] follow [facility] appointment because she thinks that the medication she is on is the cause of her problems not the urine but she will call and let legacy know and have them check the urine again,

## 2022-10-18 NOTE — Telephone Encounter (Signed)
Pt has an appt with new PCP July 25, pt is sleeping more they hare having to wake her up. I advised her that if she gets to bad send her to the ER for evaluation

## 2022-10-22 NOTE — Telephone Encounter (Signed)
Noted, thanks!

## 2022-10-22 NOTE — Telephone Encounter (Signed)
I don't see any ER visits on EPIC, can you pls f/u on how she is doing, thank you

## 2022-10-22 NOTE — Telephone Encounter (Signed)
Pt was pt on amoxicillin TID for a week for UTI. She has her MRI scheduled for 2 weeks,

## 2022-10-26 ENCOUNTER — Telehealth: Payer: Self-pay | Admitting: Neurology

## 2022-10-26 NOTE — Telephone Encounter (Signed)
Pt niece called informed that we are not the PCP  we do not have her shot records

## 2022-10-26 NOTE — Telephone Encounter (Signed)
Patient's niece has scheduled patient with a Geriatric pract.but is needing information regarding the past vaccines patient has received/KB

## 2022-10-29 ENCOUNTER — Encounter: Payer: Self-pay | Admitting: Neurology

## 2022-10-31 ENCOUNTER — Ambulatory Visit
Admission: RE | Admit: 2022-10-31 | Discharge: 2022-10-31 | Disposition: A | Discharge status: Home / Self Care | Payer: Medicare PPO | Source: Ambulatory Visit | Attending: Neurology | Admitting: Neurology

## 2022-10-31 DIAGNOSIS — R413 Other amnesia: Secondary | ICD-10-CM

## 2022-11-11 ENCOUNTER — Encounter: Payer: Self-pay | Admitting: Family

## 2022-11-11 ENCOUNTER — Ambulatory Visit
Admission: RE | Admit: 2022-11-11 | Discharge: 2022-11-11 | Disposition: A | Discharge status: Home / Self Care | Payer: Medicare PPO | Source: Ambulatory Visit | Attending: Family | Admitting: Family

## 2022-11-11 ENCOUNTER — Ambulatory Visit: Payer: Medicare PPO | Admitting: Family

## 2022-11-11 VITALS — BP 118/60 | HR 69 | Temp 97.7°F | Resp 20 | Ht 63.0 in | Wt 150.0 lb

## 2022-11-11 DIAGNOSIS — R350 Frequency of micturition: Secondary | ICD-10-CM | POA: Diagnosis not present

## 2022-11-11 DIAGNOSIS — M79671 Pain in right foot: Secondary | ICD-10-CM

## 2022-11-11 DIAGNOSIS — M25562 Pain in left knee: Secondary | ICD-10-CM

## 2022-11-11 DIAGNOSIS — G8929 Other chronic pain: Secondary | ICD-10-CM

## 2022-11-11 DIAGNOSIS — I10 Essential (primary) hypertension: Secondary | ICD-10-CM

## 2022-11-11 DIAGNOSIS — H6123 Impacted cerumen, bilateral: Secondary | ICD-10-CM

## 2022-11-11 DIAGNOSIS — M25571 Pain in right ankle and joints of right foot: Secondary | ICD-10-CM

## 2022-11-11 DIAGNOSIS — E782 Mixed hyperlipidemia: Secondary | ICD-10-CM

## 2022-11-11 DIAGNOSIS — R413 Other amnesia: Secondary | ICD-10-CM

## 2022-11-11 DIAGNOSIS — M25471 Effusion, right ankle: Secondary | ICD-10-CM

## 2022-11-11 DIAGNOSIS — R2681 Unsteadiness on feet: Secondary | ICD-10-CM

## 2022-11-11 LAB — POCT URINALYSIS DIP (MANUAL ENTRY)
Bilirubin, UA: NEGATIVE
Blood, UA: NEGATIVE
Glucose, UA: NEGATIVE mg/dL
Ketones, POC UA: NEGATIVE mg/dL
Nitrite, UA: NEGATIVE
Spec Grav, UA: 1.025 (ref 1.010–1.025)
Urobilinogen, UA: 0.2 E.U./dL
pH, UA: 6 (ref 5.0–8.0)

## 2022-11-11 MED ORDER — DEBROX 6.5 % OT SOLN
5.0000 [drp] | Freq: Two times a day (BID) | OTIC | 0 refills | Status: AC
Start: 2022-11-11 — End: 2022-11-15

## 2022-11-11 NOTE — Patient Instructions (Signed)
Please get shingles and COVID-19 vaccine at the pharmacy.

## 2022-11-11 NOTE — Progress Notes (Signed)
Provider: Shalandria Elsbernd FNP-C   Lauren Ables, MD (Inactive)  [name], MD (Inactive) as PCP - General (Family Medicine) Lauren Rakes, MD as Consulting Physician (Gastroenterology) Lauren Gauze, MD as Consulting Physician (Nephrology) Lauren Harman, MD as Consulting Physician (Neurosurgery) Lauren Low, MD as Consulting Physician (Orthopedic Surgery) Lauren Glad, MD as Referring Physician (Neurology) Lauren Gross, MD as Consulting Physician (Orthopedic Surgery) Lauren Severin, MD as Consulting Physician (Orthopedic Surgery) Lauren Chimes, MD as Consulting Physician (Ophthalmology) Lauren Overlie, MD as Consulting Physician (Obstetrics and Gynecology) Lauren Resides, MD as Consulting Physician (Dermatology) Lauren Clines, MD as Consulting Physician (Neurology)  Extended Emergency Contact Information Primary Emergency Contact: [name],[name] Home Phone: [phone] Relation: Other Secondary Emergency Contact: [name], [place] Home Phone: [phone] Relation: Son  Code Status:  Full Code  Goals of care: Advanced Directive information    10/14/2022   10:48 AM  Advanced Directives  Does Patient Have a Medical Advance Directive? Yes  Type of Estate agent of [name];Living will     Chief Complaint  Patient presents with   Establish Care    New patient here to establish care   Quality Metric Gaps    Patient is due for AWV , covid booster, and discuss need for shingrix    HPI:  Pt is a 89 y.o. female seen today establish care here at [facility] and Adult  care for medical management of chronic diseases.Has a medical History of essential hypertension, mixed hyperlipidemia, migraines,Anxiety,/depression,SVT,osteoarthritis, chronic left knee pain, unsteady gait among others.Has not had a PCP for several years.   she Lives in Independent  [place].she is here with her Niece [name] who provides HPI information. State patient has had progressive memory loss since 2021.Had issues paying bills but niece was able to step in and assist. States patient's writes large checks to soliciting companies whenever she hears on the news.Afraid patient will run out of finances and will be unable to pay for her current living place. In [place] found out she was writing blank checks and care givers were stealing her money but has been able to take care of it.on chart,review had a Neuropsychological evaluation in 2021 which indicated mild cognitive impairment.   Has had difficulty walking due to chronic pain and recent right foot and ankle pain.Request referral for state to provide guardianship since patient not in good terms with her son who lives several hours away and does not want anything to do with him.Niece unable to assist since she has had to drive every week from [place] to visit with patient but getting difficult since she still works full-time. Patient does not think she has any issues with her memory States has a PhD and retired as a Physicist, medical at [employer].  She does not drink alcohol.  Also denies any use of tobacco products.  She complains of right foot pain and numbness x 1 week.sometimes pain shoots to below the knee.  Also has chronic left knee pain that hurts bad limiting her walk.Had right knee replacement in the past but still hurts too. States had a fall episode in march hitting left side of the head.son was called " He washed his hands" of her does not want anything to do with her.Pain radiates to the neck.unable to turn her neck.she was seen by Neurologist and MRI was done which showed no acute abnormalities.   Has had  hallucination thought her mother was coming to eat lunch with her. She was seen by Dr.Karen M.Karel Jarvis 10/14/2022 Vitamin B 12 was [name] 89 was advised to taken supplement then has follow up  appointment.    Past Medical History:  Diagnosis Date   Anxiety and depression    Back pain    lumbar   Fecal incontinence    Dr. Bosie Clos, likely due to loose stools in conjunction with pelvic muscle laxity from aging, recommended to stop Miralax   GI (gastrointestinal bleed)    hx of colon polyps 2004, diverticulosis, internal hemmorhids   Headache    Headache(784.0)    Hydronephrosis    Right 2008 s/p urology eval CT/cysto: observation/monitor   Hyperlipidemia    Hypertension    Knee pain    right chronic   Migraine    on topamax , per neuro   Osteoporosis    Pulmonary nodules    Wert. First seen by CT only 05/04/03, no change on f/u 08/08/06 -Macroscopic changes rml 04/27/07 > resolved april 6,2010 no further w/u    Seborrheic keratosis    Left inferior posterior flank   SVT (supraventricular tachycardia)    sees Dr.Ross   Past Surgical History:  Procedure Laterality Date   ABDOMINAL HYSTERECTOMY     APPENDECTOMY     BASAL CELL CARCINOMA EXCISION     BLADDER REPAIR     CATARACT EXTRACTION  2008   Dr.Digby   CHOLECYSTECTOMY     lumbar reconstructive surgery  01/07/2009   R ovary removed     ROTATOR CUFF REPAIR  9/-2011   SHOULDER SURGERY  12-2011   LEFT   TONSILLECTOMY     TOTAL KNEE ARTHROPLASTY     right   trigger finger surgery     L thumb (2014), R ring finger 05-2013    Allergies  Allergen Reactions   Amitriptyline Hcl     REACTION: hallucinations   Carisoprodol     REACTION: nightmares,rash,tachycardia   Codeine     REACTION: "deathly ill" nauesa, dizzy,weak   Fosamax [Alendronate Sodium] Other (See Comments)    Dental issues   Meloxicam     REACTION: sensitive to sun; facial redness; may cause severe abd pain   Metronidazole     REACTION: abd pain   Nortriptyline Hcl     REACTION: hallucinations   Oxaprozin     REACTION: abd pain   Promethazine Hcl     REACTION: spastic arms and legs flailing while awake and unusual behavior while sleep -  walking all over house during night   Propranolol Other (See Comments)    hallucinations   Rofecoxib     REACTION: rash   Sumatriptan     REACTION: abdominal pain, blurry vision, drowsiness   Tramadol Other (See Comments)    Dizziness and constipation   Venlafaxine     Hallucinations     Allergies as of 11/11/2022       Reactions   Amitriptyline Hcl    REACTION: hallucinations   Carisoprodol    REACTION: nightmares,rash,tachycardia   Codeine    REACTION: "deathly ill" nauesa, dizzy,weak   Fosamax [alendronate Sodium] Other (See Comments)   Dental issues   Meloxicam    REACTION: sensitive to sun; facial redness; may cause severe abd pain   Metronidazole    REACTION: abd pain   Nortriptyline Hcl    REACTION: hallucinations   Oxaprozin    REACTION: abd pain   Promethazine Hcl  REACTION: spastic arms and legs flailing while awake and unusual behavior while sleep - walking all over house during night   Propranolol Other (See Comments)   hallucinations   Rofecoxib    REACTION: rash   Sumatriptan    REACTION: abdominal pain, blurry vision, drowsiness   Tramadol Other (See Comments)   Dizziness and constipation   Venlafaxine    Hallucinations        Medication List        Accurate as of November 11, 2022 11:17 AM. If you have any questions, ask your nurse or doctor.          acetaminophen 325 MG tablet Commonly known as: TYLENOL Take 650 mg by mouth every 6 (six) hours as needed.   B-12 3000 MCG Caps Take 1 capsule by mouth daily.   Centrum Silver 50+Women Tabs take 1 tablet by oral route every day   escitalopram 10 MG tablet Commonly known as: LEXAPRO TAKE 1 TABLET BY MOUTH EVERY DAY   gabapentin 300 MG capsule Commonly known as: NEURONTIN TAKE 1 CAPSULE BY MOUTH 2 TIMES DAILY in the morning and at noon and TAKE 2 CAPSULES AT BEDTIME   losartan 50 MG tablet Commonly known as: COZAAR Take 50 mg by mouth daily.   tiZANidine 2 MG tablet Commonly  known as: ZANAFLEX TAKE 1 TABLET BY MOUTH 2 TIMES DAILY IN THE MORNING AND AT BEDTIME   TRAMADOL HCL PO Take 50 mg by mouth as needed. Take 2 tabs BID daily and one at noon as needed        Review of Systems  Constitutional:  Negative for appetite change, chills, fatigue, fever and unexpected weight change.  HENT:  Positive for hearing loss. Negative for congestion, dental problem, ear discharge, ear pain, facial swelling, nosebleeds, postnasal drip, rhinorrhea, sinus pressure, sinus pain, sneezing, sore throat, tinnitus and trouble swallowing.   Eyes:  Negative for pain, discharge, redness, itching and visual disturbance.  Respiratory:  Negative for cough, chest tightness, shortness of breath and wheezing.   Cardiovascular:  Negative for chest pain, palpitations and leg swelling.  Gastrointestinal:  Negative for abdominal distention, abdominal pain, blood in stool, constipation, diarrhea, nausea and vomiting.  Endocrine: Negative for cold intolerance, heat intolerance, polydipsia, polyphagia and polyuria.  Genitourinary:  Negative for difficulty urinating, dysuria, flank pain, frequency and urgency.  Musculoskeletal:  Positive for arthralgias and gait problem. Negative for back pain, joint swelling, myalgias, neck pain and neck stiffness.  Skin:  Negative for color change, pallor, rash and wound.  Neurological:  Negative for dizziness, syncope, speech difficulty, weakness, light-headedness, numbness and headaches.  Hematological:  Does not bruise/bleed easily.  Psychiatric/Behavioral:  Positive for hallucinations. Negative for agitation, behavioral problems, confusion, self-injury, sleep disturbance and suicidal ideas. The patient is not nervous/anxious.        Memory loss     Immunization History  Administered Date(s) Administered   Fluad Quad(high Dose 65+) 12/25/2018, 01/11/2020   Influenza Split 12/23/2013, 01/05/2017, 12/26/2017   Influenza Whole 03/19/2006, 02/03/2007,  04/29/2008, 12/21/2008   Influenza, High Dose Seasonal PF 12/18/2012   Influenza-Unspecified 12/18/2013, 12/19/2014, 01/02/2016   Moderna Sars-Covid-2 Vaccination 08/06/2019, 08/20/2019, 02/25/2020   Pneumococcal Conjugate-13 04/27/2014, 04/17/2015   Pneumococcal Polysaccharide-23 01/18/2003, 01/18/2008   Td 01/18/2003   Tdap 12/20/2012   Zoster, Live 02/17/2006   Pertinent  Health Maintenance Due  Topic Date Due   INFLUENZA VACCINE  11/18/2022   DEXA SCAN  Completed      06/29/2021  3:26 PM 10/29/2021   10:16 AM 12/30/2021   10:58 AM 10/14/2022   10:47 AM 11/11/2022   10:36 AM  Fall Risk  Falls in the past year? 1 1  1  0  Was there an injury with Fall? 0 0  1 0  Fall Risk Category Calculator 2 2  3  0  Fall Risk Category (Retired) Moderate Moderate     (RETIRED) Patient Fall Risk Level High fall risk High fall risk Moderate fall risk    Patient at Risk for Falls Due to     No Fall Risks  Fall risk Follow up    Falls evaluation completed Falls evaluation completed   Functional Status Survey:    Vitals:   11/11/22 1034  BP: 118/60  Pulse: 69  Resp: 20  Temp: 97.7 F (36.5 C)  SpO2: 95%  Weight: 150 lb (68 kg)  Height: 5\' 3"  (1.6 m)   Body mass index is 26.57 kg/m. Physical Exam Vitals reviewed.  Constitutional:      General: She is not in acute distress.    Appearance: Normal appearance. She is overweight. She is not ill-appearing or diaphoretic.  HENT:     Head: Normocephalic.     Right Ear: Tympanic membrane, ear canal and external ear normal. There is no impacted cerumen.     Left Ear: Tympanic membrane, ear canal and external ear normal. There is no impacted cerumen.     Nose: Nose normal. No congestion or rhinorrhea.     Mouth/Throat:     Mouth: Mucous membranes are moist.     Pharynx: Oropharynx is clear. No oropharyngeal exudate or posterior oropharyngeal erythema.  Eyes:     General: No scleral icterus.       Right eye: No discharge.        Left eye:  No discharge.     Extraocular Movements: Extraocular movements intact.     Conjunctiva/sclera: Conjunctivae normal.     Pupils: Pupils are equal, round, and reactive to light.  Neck:     Vascular: No carotid bruit.  Cardiovascular:     Rate and Rhythm: Normal rate and regular rhythm.     Pulses: Normal pulses.     Heart sounds: Normal heart sounds. No murmur heard.    No friction rub. No gallop.  Pulmonary:     Effort: Pulmonary effort is normal. No respiratory distress.     Breath sounds: Normal breath sounds. No wheezing, rhonchi or rales.  Chest:     Chest wall: No tenderness.  Abdominal:     General: Bowel sounds are normal. There is no distension.     Palpations: Abdomen is soft. There is no mass.     Tenderness: There is no abdominal tenderness. There is no right CVA tenderness, left CVA tenderness, guarding or rebound.  Musculoskeletal:        General: No swelling. Normal range of motion.     Cervical back: Normal range of motion. No rigidity or tenderness.     Right lower leg: No edema.     Left lower leg: No edema.     Right ankle: Swelling present. No ecchymosis. Tenderness present. Normal pulse.     Right Achilles Tendon: Normal.     Left ankle: Normal.     Left Achilles Tendon: Normal.  Lymphadenopathy:     Cervical: No cervical adenopathy.  Skin:    General: Skin is warm and dry.     Coloration: Skin is not pale.  Findings: No bruising, erythema, lesion or rash.  Neurological:     Mental Status: She is alert and oriented to person, place, and time.     Cranial Nerves: No cranial nerve deficit.     Sensory: No sensory deficit.     Motor: No weakness.     Coordination: Coordination normal.     Gait: Gait abnormal.     Comments: Alert and oriented to person,place but disoriented to day/date and season.  Psychiatric:        Mood and Affect: Mood normal.        Speech: Speech normal.        Behavior: Behavior is agitated.        Thought Content: Thought  content normal.        Cognition and Memory: Memory is impaired. She exhibits impaired recent memory.        Judgment: Judgment normal.     Comments: Easily agitated towards niece during visit.Forgets what she is told within few minutes. Scored 27/30 on MMSE     Labs reviewed: Recent Labs    07/22/22 2210 10/14/22 1226  NA 134* 138  K 3.6 4.2  CL 100 103  CO2 24 28  GLUCOSE 118* 88  BUN 13 16  CREATININE 0.78 0.81  CALCIUM 9.3 10.0   Recent Labs    10/14/22 1226  AST 17  ALT 6  ALKPHOS 91  BILITOT 0.7  PROT 6.7  ALBUMIN 4.1   Recent Labs    07/22/22 2210 10/14/22 1226  WBC 7.7 7.5  NEUTROABS 5.3  --   HGB 12.1 12.9  HCT 37.2 38.9  MCV 93.7 94.5  PLT 289 325.0   Lab Results  Component Value Date   TSH 2.12 10/14/2022   Lab Results  Component Value Date   HGBA1C 6.3 11/11/2020   Lab Results  Component Value Date   CHOL 124 11/11/2020   HDL 40.50 11/11/2020   LDLCALC 50 11/11/2020   LDLDIRECT 78.0 12/20/2012   TRIG 167.0 (H) 11/11/2020   CHOLHDL 3 11/11/2020    Significant Diagnostic Results in last 30 days:  MR BRAIN WO CONTRAST  Result Date: 11/01/2022 CLINICAL DATA:  Provided history: Memory changes. Memory loss. Additional history provided by the scanning technologist: Headaches, confusion, prior fall. EXAM: MRI HEAD WITHOUT CONTRAST TECHNIQUE: Multiplanar, multiecho pulse sequences of the brain and surrounding structures were obtained without intravenous contrast. COMPARISON:  CT of the head and maxillofacial structures 12/30/2021. Brain MRI 07/16/2017. FINDINGS: Brain: Generalized cerebral atrophy, mild-to-moderate for age. Mild cerebellar atrophy. Multifocal T2 FLAIR hyperintense signal abnormality within the cerebral white matter, nonspecific but compatible with mild chronic small vessel ischemic disease. There is no acute infarct. No evidence of an intracranial mass. No extra-axial fluid collection. No midline shift. Vascular: Maintained flow voids  within the proximal large arterial vessels. Skull and upper cervical spine: No focal suspicious marrow lesion. Nonspecific reversal of the expected cervical lordosis. Mild C2-C3 grade 1 anterolisthesis. Incompletely assessed cervical spondylosis. Sinuses/Orbits: No mass or acute finding within the imaged orbits. Prior bilateral ocular lens replacement. Left scleral buckle. No significant paranasal sinus disease. IMPRESSION: 1.  No evidence of an acute intracranial abnormality. 2. Mild chronic small vessel ischemic changes within the cerebral white matter, progressed from the prior brain MRI of 07/16/2017. 3. Generalized cerebral atrophy, mild-to-moderate for age. 4. Mild cerebellar atrophy. Electronically Signed   By: Jackey Loge D.O.   On: 11/01/2022 09:33    Assessment/Plan 1. Essential hypertension Blood  pressure well-controlled -Continue on losartan  2. Acute foot pain, right Unclear etiology but limiting mobility.  Will obtain imaging and sent to orthopedic or physical therapy if indicated -Advised to take over-the-counter Tylenol in the morning and evening.  Took 1 Tylenol during visit. - DG Foot Complete Right; Future  3. Mixed hyperlipidemia No recent LDL for review but past results were within goal. -Not on any statin  4. Chronic pain of left knee Chronic limiting her mobility observed sitting on her rollator during visit and paddles with a feet -Requires electric lift reclining chair to assist getting in and out of the chair and elevating legs - For home use only DME Other see comment  5. Unsteady gait Remains high risk for falls.  Fall and safety precaution discussed.  Recommend wheelchair if left knee and ankle worsens. - For home use only DME Other see comment  6. Memory loss Has had progressive decline though scored high on her MMSE but forgetful during visit asking repeative questions within 5 minutes.  Suspect memory loss could be related to have a [name] vitamin B-12 recently  done by neurologist.  Currently on supplement was unable to get injection as recommended due to her current living situation. Currently [name] in independent living and has no healthcare power of attorney needs request state guardian since son has washed his hands on her and she does not want anything to do with him.  Niece unable to take over guardianship since she lives and works entirely unable to frequently take care of her.  Currently [name] at [facility] and has a caregiver who assist her with ADLs but usually makes her own breakfast and eats meals at the facility. Recommend transfer to higher level of care assisted living.  Will place order for social worker to assist. -Will need advanced care planning - Ambulatory referral to Social Work  7. Pain and swelling of right ankle -No signs of infection noted during visit Continue with Tylenol will obtain lab work. - DG Ankle Complete Right; Future  8. Bilateral impacted cerumen Bilateral tympanic membrane not visualized due to cerumen impaction. - Advised Instill debrox 6.5 otic solution 5 drops into each ear twice daily x 4 days then follow up for ear lavage.May apply cotton ball at bedtime to prevent drainage to pillow. - carbamide peroxide (DEBROX) 6.5 % OTIC solution; Place 5 drops into both ears 2 (two) times daily for 4 days.  Dispense: 2 mL; Refill: 0  9. Urine frequency Afebrile Will obtain urine specimen to rule out urinary tract infection - POCT urinalysis dipstick indicates straw cloudy urine negative for blood, nitrites but positive for 2+ moderate leukocytes.  Will send urine for culture then treat if indicated. - Culture, Urine    Family/ staff Communication: Reviewed plan of care with patient and niece verbalized understanding  Labs/tests ordered:  - DG Foot Complete Right; Future - DG Ankle Complete Right; Future - Culture, Urine - POCT urinalysis dipstick  Next Appointment : Return in about 1 week (around  11/18/2022) for Bilateral ear lavage.  Addendum : Referral coordinate request social worker referral need to be changed to chronic care management Ref [id number].   Caesar Bookman, NP

## 2022-11-12 ENCOUNTER — Telehealth: Payer: Self-pay

## 2022-11-12 NOTE — Telephone Encounter (Signed)
Looks like she is allergic to mobic and tramadol which are the alternative for pain.Recommend Salon pas patch to back or hip for pain along with tylenol twice daily.

## 2022-11-12 NOTE — Telephone Encounter (Signed)
Patient's niece, [name] called and left message on clinical intake voicemail. She stated she would like to know if there is a pain killer that patient can take that doesn't make her drowsy. She states that patient is taking gabapentin and she falls asleep anywhere when she takes the gabapentin.She would like something other than gabapentin that doesn't cause drowsiness.  Message sent to Richarda Blade, NP

## 2022-11-15 NOTE — Addendum Note (Signed)
Addended byRicharda Blade C on: 11/15/2022 09:22 AM   Modules accepted: Orders

## 2022-11-16 ENCOUNTER — Other Ambulatory Visit: Payer: Self-pay | Admitting: Neurology

## 2022-11-16 ENCOUNTER — Telehealth: Payer: Self-pay | Admitting: Neurology

## 2022-11-16 NOTE — Telephone Encounter (Signed)
Pts niece is calling in stating that the pt is having issues with Rx gabapentin NEURONTIN) 300 MG is making her very dizzy and falls the sleep all the time at the dinner table. They are wanting to see if the dosage can be lowered and also taken off of auto refill.  Niece would like to have a call back as soon as possible.

## 2022-11-17 MED ORDER — GABAPENTIN 300 MG PO CAPS: ORAL_CAPSULE | ORAL | Status: DC

## 2022-11-17 NOTE — Telephone Encounter (Signed)
  Dr Everlena Cooper stated that he would discontinue the 300mg  in morning and afternoon and just take the 600mg  at bedtime for now, to see if dizziness improves   Pt niece wants to get her off gabapentin altogether and for her to just take tylenol

## 2022-11-18 ENCOUNTER — Ambulatory Visit (INDEPENDENT_AMBULATORY_CARE_PROVIDER_SITE_OTHER): Payer: Medicare PPO | Admitting: Family

## 2022-11-18 ENCOUNTER — Encounter: Payer: Self-pay | Admitting: Family

## 2022-11-18 ENCOUNTER — Telehealth: Payer: Self-pay | Admitting: *Deleted

## 2022-11-18 VITALS — BP 140/80 | HR 74 | Temp 97.7°F | Resp 18 | Ht 63.0 in | Wt 152.0 lb

## 2022-11-18 DIAGNOSIS — H6123 Impacted cerumen, bilateral: Secondary | ICD-10-CM | POA: Diagnosis not present

## 2022-11-18 DIAGNOSIS — R1030 Lower abdominal pain, unspecified: Secondary | ICD-10-CM | POA: Diagnosis not present

## 2022-11-18 DIAGNOSIS — M5442 Lumbago with sciatica, left side: Secondary | ICD-10-CM

## 2022-11-18 MED ORDER — PREDNISONE 20 MG PO TABS: ORAL_TABLET | ORAL | 0 refills | Status: AC

## 2022-11-18 NOTE — Progress Notes (Signed)
  Care Coordination   Note   11/18/2022 Name: [name] MRN: [id number] DOB: 10-May-1929  [name] is a 89 y.o. year old female who sees Ngetich, Donalee Citrin, NP for primary care. I reached out to Cordelia Poche by phone today to offer care coordination services.  Ms. Chiang was given information about Care Coordination services today including:   The Care Coordination services include support from the care team which includes your Nurse Coordinator, Clinical Social Worker, or Pharmacist.  The Care Coordination team is here to help remove barriers to the health concerns and goals most important to you. Care Coordination services are voluntary, and the patient may decline or stop services at any time by request to their care team member.   Care Coordination Consent Status: Patient agreed to services and verbal consent obtained.   Follow up plan:  Telephone appointment with care coordination team member scheduled for:  11/19/2022  Encounter Outcome:  Pt. Scheduled from referral   Burman Nieves, [facility] Care Coordination Care Guide Direct Dial: [phone]

## 2022-11-18 NOTE — Progress Notes (Signed)
Provider: Richarda Blade FNP-C  Tradarius Reinwald, Donalee Citrin, NP  Patient Care Team: Kawehi Hostetter, Donalee Citrin, NP as PCP - General (Family Medicine) Charlott Rakes, MD as Consulting Physician (Gastroenterology) Primitivo Gauze, MD as Consulting Physician (Nephrology) Maeola Harman, MD as Consulting Physician (Neurosurgery) Beverely Low, MD as Consulting Physician (Orthopedic Surgery) Santiago Glad, MD as Referring Physician (Neurology) Ollen Gross, MD as Consulting Physician (Orthopedic Surgery) Dominica Severin, MD as Consulting Physician (Orthopedic Surgery) Nelson Chimes, MD as Consulting Physician (Ophthalmology) Marcelle Overlie, MD as Consulting Physician (Obstetrics and Gynecology) Arminda Resides, MD as Consulting Physician (Dermatology) Van Clines, MD as Consulting Physician (Neurology)  Extended Emergency Contact Information Primary Emergency Contact: GODWIN,MARGARET Home Phone: [phone] Relation: Other Secondary Emergency Contact: Raul Del, [place] Home Phone: [phone] Relation: Son  Code Status: Full Code  Goals of care: Advanced Directive information    10/14/2022   10:48 AM  Advanced Directives  Does Patient Have a Medical Advance Directive? Yes  Type of Estate agent of [place];Living will     Chief Complaint  Patient presents with   Follow-up    Patient is here for an ear lavage    HPI:  Pt is a 89 y.o. female seen today for an acute visit for bilateral ear lavage.she was here 11/11/2022 bilateral cerumen impaction was noted. She was advised to instil debrox 6.5 % otic solution 5 drops into each ear twice daily x 4 days then follow up today for ear lavage.  She complains of left lower back pain x 1 week. Pain radiates to back of the left thigh.described as an ache.she denies any weakness, numbness or tingling on the leg.  Also complains of left lower abdomen pain x 2 days.pain comes and  goes.No issues with constipation or diarrhea,N/V    Past Medical History:  Diagnosis Date   Anxiety and depression    Back pain    lumbar   Fecal incontinence    Dr. Bosie Clos, likely due to loose stools in conjunction with pelvic muscle laxity from aging, recommended to stop Miralax   GI (gastrointestinal bleed)    hx of colon polyps 2004, diverticulosis, internal hemmorhids   Headache    Headache(784.0)    Hydronephrosis    Right 2008 s/p urology eval CT/cysto: observation/monitor   Hyperlipidemia    Hypertension    Knee pain    right chronic   Migraine    on topamax , per neuro   Osteoporosis    Pulmonary nodules    [place]. First seen by CT only 05/04/03, no change on f/u 08/08/06 -Macroscopic changes rml 04/27/07 > resolved april 6,2010 no further w/u    Seborrheic keratosis    Left inferior posterior flank   SVT (supraventricular tachycardia)    sees Dr.Ross   Past Surgical History:  Procedure Laterality Date   ABDOMINAL HYSTERECTOMY     APPENDECTOMY     BASAL CELL CARCINOMA EXCISION     BLADDER REPAIR     CATARACT EXTRACTION  2008   Dr.Digby   CHOLECYSTECTOMY     lumbar reconstructive surgery  01/07/2009   R ovary removed     ROTATOR CUFF REPAIR  9/-2011   SHOULDER SURGERY  12-2011   LEFT   TONSILLECTOMY     TOTAL KNEE ARTHROPLASTY     right   trigger finger surgery     L thumb (2014), R ring finger 05-2013    Allergies  Allergen Reactions   Amitriptyline Hcl     REACTION: hallucinations   Carisoprodol     REACTION: nightmares,rash,tachycardia   Codeine     REACTION: "deathly ill" nauesa, dizzy,weak   Fosamax [Alendronate Sodium] Other (See Comments)    Dental issues   Meloxicam     REACTION: sensitive to sun; facial redness; may cause severe abd pain   Metronidazole     REACTION: abd pain   Nortriptyline Hcl     REACTION: hallucinations   Oxaprozin     REACTION: abd pain   Promethazine Hcl     REACTION: spastic arms and legs flailing while awake and  unusual behavior while sleep - walking all over house during night   Propranolol Other (See Comments)    hallucinations   Rofecoxib     REACTION: rash   Sumatriptan     REACTION: abdominal pain, blurry vision, drowsiness   Tramadol Other (See Comments)    Dizziness and constipation   Venlafaxine     Hallucinations     Outpatient Encounter Medications as of 11/18/2022  Medication Sig   acetaminophen (TYLENOL) 325 MG tablet Take 650 mg by mouth every 6 (six) hours as needed.   Cyanocobalamin (B-12) 3000 MCG CAPS Take 1 capsule by mouth daily.   escitalopram (LEXAPRO) 10 MG tablet TAKE 1 TABLET BY MOUTH EVERY DAY   gabapentin (NEURONTIN) 300 MG capsule TAKE 2 CAPSULES AT BEDTIME   losartan (COZAAR) 50 MG tablet Take 50 mg by mouth daily.   Multiple Vitamins-Minerals (CENTRUM SILVER 50+WOMEN) TABS take 1 tablet by oral route every day   No facility-administered encounter medications on file as of 11/18/2022.    Review of Systems  Constitutional:  Negative for appetite change, chills, fatigue, fever and unexpected weight change.  HENT:  Negative for congestion, dental problem, ear discharge, ear pain, facial swelling, hearing loss, nosebleeds, postnasal drip, rhinorrhea, sinus pressure, sinus pain, sneezing, sore throat, tinnitus and trouble swallowing.   Respiratory:  Negative for cough, chest tightness, shortness of breath and wheezing.   Cardiovascular:  Negative for chest pain, palpitations and leg swelling.  Gastrointestinal:  Positive for abdominal pain. Negative for abdominal distention, blood in stool, constipation, diarrhea, nausea and vomiting.  Genitourinary:  Negative for difficulty urinating, dysuria, flank pain, frequency and urgency.  Musculoskeletal:  Positive for arthralgias, back pain and gait problem. Negative for joint swelling, myalgias, neck pain and neck stiffness.  Skin:  Negative for color change, pallor, rash and wound.  Neurological:  Negative for dizziness,  syncope, speech difficulty, weakness, light-headedness, numbness and headaches.    Immunization History  Administered Date(s) Administered   Fluad Quad(high Dose 65+) 12/25/2018, 01/11/2020   Influenza Split 12/23/2013, 01/05/2017, 12/26/2017   Influenza Whole 03/19/2006, 02/03/2007, 04/29/2008, 12/21/2008   Influenza, High Dose Seasonal PF 12/18/2012   Influenza-Unspecified 12/18/2013, 12/19/2014, 01/02/2016   Moderna Sars-Covid-2 Vaccination 08/06/2019, 08/20/2019, 02/25/2020   Pneumococcal Conjugate-13 04/27/2014, 04/17/2015   Pneumococcal Polysaccharide-23 01/18/2003, 01/18/2008   Td 01/18/2003   Tdap 12/20/2012   Zoster, Live 02/17/2006   Pertinent  Health Maintenance Due  Topic Date Due   INFLUENZA VACCINE  11/18/2022   DEXA SCAN  Completed      06/29/2021    3:26 PM 10/29/2021   10:16 AM 12/30/2021   10:58 AM 10/14/2022   10:47 AM 11/11/2022   10:36 AM  Fall Risk  Falls in the past year? 1 1  1 1   Was there an injury with Fall? 0 0  1 1  Fall Risk Category Calculator 2 2  3 3   Fall Risk Category (Retired) Moderate Moderate     (RETIRED) Patient Fall Risk Level High fall risk High fall risk Moderate fall risk    Patient at Risk for Falls Due to     History of fall(s);Impaired balance/gait;Impaired mobility;Mental status change  Fall risk Follow up    Falls evaluation completed Falls evaluation completed   Functional Status Survey:    Vitals:   11/18/22 0927 11/18/22 1011  BP: (!) 140/80 (!) 140/80  Pulse:  74  Resp:  18  Temp:  97.7 F (36.5 C)  SpO2:  94%  Weight:  152 lb (68.9 kg)  Height: 5\' 3"  (1.6 m) 5\' 3"  (1.6 m)   Body mass index is 26.93 kg/m. Physical Exam Vitals reviewed.  Constitutional:      General: She is not in acute distress.    Appearance: Normal appearance. She is overweight. She is not ill-appearing or diaphoretic.  HENT:     Head: Normocephalic.     Right Ear: There is impacted cerumen.     Left Ear: There is impacted cerumen.      Ears:     Comments: Bilateral ear cerumen lavaged with warm water and hydrogen peroxide moderate amounts of cerumen obtained.Tolerated procedure well.TM clear without any signs of infection but canal skin red from where cerumen.     Nose: Nose normal. No congestion or rhinorrhea.     Mouth/Throat:     Mouth: Mucous membranes are moist.     Pharynx: Oropharynx is clear. No oropharyngeal exudate or posterior oropharyngeal erythema.  Eyes:     General: No scleral icterus.       Right eye: No discharge.        Left eye: No discharge.     Conjunctiva/sclera: Conjunctivae normal.     Pupils: Pupils are equal, round, and reactive to light.  Neck:     Vascular: No carotid bruit.  Cardiovascular:     Rate and Rhythm: Normal rate and regular rhythm.     Pulses: Normal pulses.     Heart sounds: Normal heart sounds. No murmur heard.    No friction rub. No gallop.  Pulmonary:     Effort: Pulmonary effort is normal. No respiratory distress.     Breath sounds: Normal breath sounds. No wheezing, rhonchi or rales.  Chest:     Chest wall: No tenderness.  Abdominal:     General: Bowel sounds are normal. There is no distension.     Palpations: Abdomen is soft. There is no mass.     Tenderness: There is no abdominal tenderness. There is no right CVA tenderness, left CVA tenderness, guarding or rebound.  Musculoskeletal:        General: No swelling or tenderness. Normal range of motion.     Cervical back: Normal range of motion. No rigidity or tenderness.     Right lower leg: Edema present.     Left lower leg: No edema.     Comments: Trace edema on left ankle   Lymphadenopathy:     Cervical: No cervical adenopathy.  Skin:    General: Skin is warm and dry.     Coloration: Skin is not pale.     Findings: No erythema or rash.  Neurological:     Mental Status: She is alert and oriented to person, place, and time.     Cranial Nerves: No cranial nerve deficit.     Sensory: No sensory  deficit.      Motor: No weakness.     Coordination: Coordination normal.     Gait: Gait abnormal.  Psychiatric:        Mood and Affect: Mood normal.        Speech: Speech normal.        Behavior: Behavior normal.     Labs reviewed: Recent Labs    07/22/22 2210 10/14/22 1226  NA 134* 138  K 3.6 4.2  CL 100 103  CO2 24 28  GLUCOSE 118* 88  BUN 13 16  CREATININE 0.78 0.81  CALCIUM 9.3 10.0   Recent Labs    10/14/22 1226  AST 17  ALT 6  ALKPHOS 91  BILITOT 0.7  PROT 6.7  ALBUMIN 4.1   Recent Labs    07/22/22 2210 10/14/22 1226  WBC 7.7 7.5  NEUTROABS 5.3  --   HGB 12.1 12.9  HCT 37.2 38.9  MCV 93.7 94.5  PLT 289 325.0   Lab Results  Component Value Date   TSH 2.12 10/14/2022   Lab Results  Component Value Date   HGBA1C 6.3 11/11/2020   Lab Results  Component Value Date   CHOL 124 11/11/2020   HDL 40.50 11/11/2020   LDLCALC 50 11/11/2020   LDLDIRECT 78.0 12/20/2012   TRIG 167.0 (H) 11/11/2020   CHOLHDL 3 11/11/2020    Significant Diagnostic Results in last 30 days:  MR BRAIN WO CONTRAST  Result Date: 11/01/2022 CLINICAL DATA:  Provided history: Memory changes. Memory loss. Additional history provided by the scanning technologist: Headaches, confusion, prior fall. EXAM: MRI HEAD WITHOUT CONTRAST TECHNIQUE: Multiplanar, multiecho pulse sequences of the brain and surrounding structures were obtained without intravenous contrast. COMPARISON:  CT of the head and maxillofacial structures 12/30/2021. Brain MRI 07/16/2017. FINDINGS: Brain: Generalized cerebral atrophy, mild-to-moderate for age. Mild cerebellar atrophy. Multifocal T2 FLAIR hyperintense signal abnormality within the cerebral white matter, nonspecific but compatible with mild chronic small vessel ischemic disease. There is no acute infarct. No evidence of an intracranial mass. No extra-axial fluid collection. No midline shift. Vascular: Maintained flow voids within the proximal large arterial vessels. Skull and  upper cervical spine: No focal suspicious marrow lesion. Nonspecific reversal of the expected cervical lordosis. Mild C2-C3 grade 1 anterolisthesis. Incompletely assessed cervical spondylosis. Sinuses/Orbits: No mass or acute finding within the imaged orbits. Prior bilateral ocular lens replacement. Left scleral buckle. No significant paranasal sinus disease. IMPRESSION: 1.  No evidence of an acute intracranial abnormality. 2. Mild chronic small vessel ischemic changes within the cerebral white matter, progressed from the prior brain MRI of 07/16/2017. 3. Generalized cerebral atrophy, mild-to-moderate for age. 4. Mild cerebellar atrophy. Electronically Signed   By: Jackey Loge D.O.   On: 11/01/2022 09:33    Assessment/Plan  1. Bilateral impacted cerumen Bilateral ear cerumen lavaged with warm water and hydrogen peroxide moderate amounts of cerumen obtained.Tolerated procedure well.TM clear without any signs of infection but canal skin red from where cerumen.  2. Acute left-sided back pain with sciatica Lower back pain radiating to left posterior thigh. - predniSONE (DELTASONE) 20 MG tablet; Take 2 tablets (40 mg total) by mouth daily with breakfast for 1 day, THEN 1.5 tablets (30 mg total) daily with breakfast for 1 day, THEN 1 tablet (20 mg total) daily with breakfast for 1 day, THEN 0.5 tablets (10 mg total) daily with breakfast for 1 day.  Dispense: 5 tablet; Refill: 0  3. Lower abdominal pain Non-tender to palpation BS + x 4  quadrant non-distended.  Family/ staff Communication: Reviewed plan of care with patient verbalized understanding   Labs/tests ordered: None   Next Appointment: Return in about 6 months (around 05/21/2023) for medical mangement of chronic issues., Fasting labs in 6 months prior to visit.   Caesar Bookman, NP

## 2022-11-18 NOTE — Patient Instructions (Signed)
-   Apply ice compressor to left ankle area to keep swelling down   - Apply heating pad to lower back for 15-20 minutes daily to help with lower back pain.  - Notify provider if symptoms worsen or fail to improve

## 2022-11-19 ENCOUNTER — Ambulatory Visit: Payer: Self-pay | Admitting: Licensed Clinical Social Worker

## 2022-11-22 ENCOUNTER — Telehealth: Payer: Self-pay | Admitting: Neurology

## 2022-11-22 NOTE — Telephone Encounter (Signed)
Pt caregiver called wanting to speak with nurse about trying to wing [name] of a medication. Call back number [phone]

## 2022-11-22 NOTE — Addendum Note (Signed)
Addended by: Dimas Chyle on: 11/22/2022 10:09 AM   Modules accepted: Orders

## 2022-11-22 NOTE — Telephone Encounter (Signed)
Confirmed that pt is taken 600mg  at night per Dr Karel Jarvis orders pt can decrese to 300 mg at night for a week and then stop the gabapentin

## 2022-11-22 NOTE — Telephone Encounter (Signed)
Pls confirm how she is taking it right now. Per last phone note, Dr Everlena Cooper stated that he would discontinue the 300mg  in morning and afternoon and just take the 600mg  at bedtime for now. Is she just taking 600mg  at bedtime? If yes, reduce to 300mg  at bedtime for 1 week, then stop.

## 2022-11-23 NOTE — Patient Outreach (Signed)
  Care Coordination   Initial Visit Note   11/19/2022 Name: [name] MRN: [id number] DOB: Aug 23, 1929  [name] is a 89 y.o. year old female who sees [name], Lauren Citrin, NP for primary care. I spoke with  [name] by phone today.  What matters to the patients health and wellness today?  Caregiver Stress and Level of Care    Goals Addressed             This Visit's Progress    Obtain Supportive Resources-Caregiver Stress   On track    Activities and task to complete in order to accomplish goals.   Keep all upcoming appointments discussed today Continue with compliance of taking medication prescribed by Doctor Implement healthy coping skills discussed to assist with management of symptoms          SDOH assessments and interventions completed:  No     Care Coordination Interventions:  Yes, provided  Interventions Today    Flowsheet Row Most Recent Value  Chronic Disease   Chronic disease during today's visit Hypertension (HTN), Chronic Kidney Disease/End Stage Renal Disease (ESRD), Other  [Anxiety and Depression]  General Interventions   General Interventions Discussed/Reviewed General Interventions Discussed, Doctor Visits, Community Resources, Level of Care  Doctor Visits Discussed/Reviewed Doctor Visits Discussed  Level of Care Skilled Nursing Facility, Assisted Living, Personal Care Services  Mental Health Interventions   Mental Health Discussed/Reviewed Mental Health Discussed, Coping Strategies, Depression, Anxiety  Safety Interventions   Safety Discussed/Reviewed Safety Discussed, Home Safety  [Patient has Life Lock in case of a fall]  Home Safety Assistive Devices       Follow up plan: Follow up call scheduled for 4-6 weeks    Encounter Outcome:  Pt. Visit Completed   Jenel Lucks, MSW, LCSW [facility] Care Management [facility]  [facility] [email or web address] Phone [phone] 8:00 AM

## 2022-11-23 NOTE — Patient Instructions (Signed)
Visit Information  Thank you for taking time to visit with me today. Please don't hesitate to contact me if I can be of assistance to you.   Following are the goals we discussed today:   Goals Addressed             This Visit's Progress    Obtain Supportive Resources-Caregiver Stress   On track    Activities and task to complete in order to accomplish goals.   Keep all upcoming appointments discussed today Continue with compliance of taking medication prescribed by Doctor Implement healthy coping skills discussed to assist with management of symptoms          Our next appointment is by telephone on 08/16 at 1 PM  Please call the care guide team at [phone] if you need to cancel or reschedule your appointment.   If you are experiencing a Mental Health or Behavioral Health Crisis or need someone to talk to, please call the Suicide and Crisis Lifeline: 988 call 911   The patient verbalized understanding of instructions, educational materials, and care plan provided today and DECLINED offer to receive copy of patient instructions, educational materials, and care plan.   Jenel Lucks, MSW, LCSW [facility] Care Management [place]  [facility] [email or web address] Phone [phone] 8:01 AM

## 2022-12-03 ENCOUNTER — Ambulatory Visit: Payer: Self-pay | Admitting: Licensed Clinical Social Worker

## 2022-12-07 NOTE — Patient Instructions (Signed)
Visit Information  Thank you for taking time to visit with me today. Please don't hesitate to contact me if I can be of assistance to you.   Following are the goals we discussed today:   Goals Addressed             This Visit's Progress    Obtain Supportive Resources-Caregiver Stress   On track    Activities and task to complete in order to accomplish goals.   Keep all upcoming appointments discussed today Continue with compliance of taking medication prescribed by Doctor Implement healthy coping skills discussed to assist with management of symptoms          Our next appointment is by telephone on 09/06 at 1 PM  Please call the care guide team at [phone] if you need to cancel or reschedule your appointment.   If you are experiencing a Mental Health or Behavioral Health Crisis or need someone to talk to, please call the Suicide and Crisis Lifeline: 988 call 911   The patient verbalized understanding of instructions, educational materials, and care plan provided today and DECLINED offer to receive copy of patient instructions, educational materials, and care plan.   Jenel Lucks, MSW, LCSW [facility] Care Management [place]  [facility] [email or web address] Phone [phone] 9:40 AM

## 2022-12-07 NOTE — Patient Outreach (Signed)
  Care Coordination   Follow Up Visit Note   12/03/2022 Name: [name] MRN: [id number] DOB: Sep 03, 1929  [name] is a 89 y.o. year old female who sees Ngetich, Donalee Citrin, NP for primary care. I spoke with  Cordelia Poche by phone today.  What matters to the patients health and wellness today?  Caregiver Strain and Level of Care    Goals Addressed             This Visit's Progress    Obtain Supportive Resources-Caregiver Stress   On track    Activities and task to complete in order to accomplish goals.   Keep all upcoming appointments discussed today Continue with compliance of taking medication prescribed by Doctor Implement healthy coping skills discussed to assist with management of symptoms          SDOH assessments and interventions completed:  No     Care Coordination Interventions:  Yes, provided  Interventions Today    Flowsheet Row Most Recent Value  Chronic Disease   Chronic disease during today's visit Hypertension (HTN), Chronic Kidney Disease/End Stage Renal Disease (ESRD), Other  [Anxiety, depression, hx of memory loss]  General Interventions   General Interventions Discussed/Reviewed General Interventions Reviewed, Doctor Visits, Level of Care  [Pt's niece shared that pt's short term memory has declined significantly. Family are f/up with [employer] regarding caregiver assistance.]  Doctor Visits Discussed/Reviewed Doctor Visits Reviewed  Level of Care Assisted Living  [Family is visiting with local assisted living facilities [facility])]  Mental Health Interventions   Mental Health Discussed/Reviewed Mental Health Reviewed, Coping Strategies  Nutrition Interventions   Nutrition Discussed/Reviewed Nutrition Reviewed  Pharmacy Interventions   Pharmacy Dicussed/Reviewed Pharmacy Topics Reviewed, Medication Adherence  [Starting last night, not taking any more gabapentin. Taking low dose bp medicine.]  Safety  Interventions   Safety Discussed/Reviewed Safety Reviewed, Fall Risk, Home Safety  Guadelupe Sabin has not identified any fall hazards in the home]       Follow up plan: Follow up call scheduled for 2-4 weeks    Encounter Outcome:  Pt. Visit Completed   Jenel Lucks, MSW, LCSW [facility] Care Management [facility]  [facility] [email or web address] Phone [phone] 9:40 AM

## 2022-12-10 ENCOUNTER — Other Ambulatory Visit: Payer: Self-pay

## 2022-12-15 ENCOUNTER — Telehealth: Payer: Self-pay | Admitting: Family

## 2022-12-15 NOTE — Telephone Encounter (Signed)
Patient's niece called and asked that I send the FL2 form to [employer] again as they said they did not receive it. Per patient, I faxed the FL2 [phone]) and emailed it ([email or web address]).

## 2022-12-17 ENCOUNTER — Telehealth: Payer: Self-pay | Admitting: Licensed Clinical Social Worker

## 2022-12-17 NOTE — Patient Instructions (Signed)
Visit Information  Thank you for taking time to visit with me today. Please don't hesitate to contact me if I can be of assistance to you.   Following are the goals we discussed today:   Goals Addressed             This Visit's Progress    Obtain Supportive Resources-Caregiver Stress   On track    Activities and task to complete in order to accomplish goals.   Keep all upcoming appointments discussed today Continue with compliance of taking medication prescribed by Doctor Implement healthy coping skills discussed to assist with management of symptoms           Please call the care guide team at [phone] if you need to cancel or reschedule your appointment.   If you are experiencing a Mental Health or Behavioral Health Crisis or need someone to talk to, please call the Suicide and Crisis Lifeline: 988 call 911   The patient verbalized understanding of instructions, educational materials, and care plan provided today and DECLINED offer to receive copy of patient instructions, educational materials, and care plan.   Jenel Lucks, MSW, LCSW [facility] Care Management [place]  [facility] [email or web address] Phone [phone] 8:33 AM

## 2022-12-17 NOTE — Patient Outreach (Signed)
  Care Coordination   Follow Up Visit Note   12/15/2022 Name: [name] MRN: [id number] DOB: 1930-02-05  [name] is a 89 y.o. year old female who sees [name], Lauren Citrin, NP for primary care. I spoke with  [name]'s niece, [name], by phone today.  What matters to the patients health and wellness today?  Placement and FL2 concerns    Goals Addressed             This Visit's Progress    Obtain Supportive Resources-Caregiver Stress   On track    Activities and task to complete in order to accomplish goals.   Keep all upcoming appointments discussed today Continue with compliance of taking medication prescribed by Doctor Implement healthy coping skills discussed to assist with management of symptoms          SDOH assessments and interventions completed:  No     Care Coordination Interventions:  Yes, provided  Interventions Today    Flowsheet Row Most Recent Value  Chronic Disease   Chronic disease during today's visit Hypertension (HTN), Chronic Kidney Disease/End Stage Renal Disease (ESRD), Other  [Anxiety, Depression, Memory Loss]  General Interventions   General Interventions Discussed/Reviewed General Interventions Reviewed, Doctor Visits, Level of Care, Communication with  Doctor Visits Discussed/Reviewed Doctor Visits Reviewed  Communication with PCP/Specialists  [Spoke with PCP office to assist with FL2]  Level of Care Assisted Living  Mental Health Interventions   Mental Health Discussed/Reviewed Mental Health Reviewed, Coping Strategies  Pharmacy Interventions   Pharmacy Dicussed/Reviewed Pharmacy Topics Reviewed, Medication Adherence  Safety Interventions   Safety Discussed/Reviewed Safety Reviewed       Follow up plan: Follow up call scheduled for 2-4 weeks    Encounter Outcome:  Pt. Visit Completed   Jenel Lucks, MSW, LCSW [facility] Care Management [facility]  [facility] [email or web address] Phone [phone] 8:32 AM

## 2022-12-21 ENCOUNTER — Telehealth: Payer: Self-pay | Admitting: Neurology

## 2022-12-21 NOTE — Telephone Encounter (Signed)
Erskine Emery (pt niece ) is wanting to get a call back , concerning pts care

## 2022-12-22 NOTE — Telephone Encounter (Signed)
Family has cancelled the appt with you this month. They have rescheduled out into 06/2023. She is doing much better with tylenol, no pain meds. No headaches anymore. She is currently at [facility] [address]. If you feel she needs to come in sooner please let her know. Thank you.

## 2022-12-23 NOTE — Telephone Encounter (Signed)
She is still on the schedule for 9/19. If she is doing fine, ok for March f/u, thanks

## 2022-12-23 NOTE — Telephone Encounter (Signed)
I advised to daughter and she will keep the appt in march 2025. If something changes she will call back, very appreciative of the call.

## 2022-12-24 ENCOUNTER — Encounter: Payer: Self-pay | Admitting: Licensed Clinical Social Worker

## 2022-12-27 ENCOUNTER — Telehealth: Payer: Self-pay | Admitting: Licensed Clinical Social Worker

## 2022-12-27 NOTE — Patient Outreach (Signed)
  Care Coordination   12/24/2022 Name: [name] MRN: [id number] DOB: Dec 10, 1929   Care Coordination Outreach Attempts:  An unsuccessful telephone outreach was attempted for a scheduled appointment today.  Follow Up Plan:  Additional outreach attempts will be made to offer the patient care coordination information and services.   Encounter Outcome:  No Answer   Care Coordination Interventions:  No, not indicated    Jenel Lucks, MSW, LCSW [facility] Care Management [place]  [facility] [email or web address] Phone [phone] 3:50 PM

## 2022-12-28 ENCOUNTER — Telehealth: Payer: Self-pay | Admitting: Licensed Clinical Social Worker

## 2022-12-30 NOTE — Patient Instructions (Signed)
Visit Information  Thank you for taking time to visit with me today. Please don't hesitate to contact me if I can be of assistance to you.   Following are the goals we discussed today:   Goals Addressed             This Visit's Progress    COMPLETED: Obtain Supportive Resources-Caregiver Stress       Activities and task to complete in order to accomplish goals.   Keep all upcoming appointments discussed today Continue with compliance of taking medication prescribed by Doctor Implement healthy coping skills discussed to assist with management of symptoms          Please call the care guide team at [phone] if you need to cancel or reschedule your appointment.   If you are experiencing a Mental Health or Behavioral Health Crisis or need someone to talk to, please call the Suicide and Crisis Lifeline: 988 call 911   The patient verbalized understanding of instructions, educational materials, and care plan provided today and DECLINED offer to receive copy of patient instructions, educational materials, and care plan.   Jenel Lucks, MSW, LCSW [facility] Care Management [place]  [facility] [email or web address] Phone [phone] 10:34 AM

## 2022-12-30 NOTE — Patient Outreach (Signed)
  Care Coordination   Follow Up Visit Note   12/28/2022 Name: Laionna Barrionuevo MRN: [id number] DOB: 09/08/1929  Jalea Gaspar is a 89 y.o. year old female who sees Ngetich, Donalee Citrin, NP for primary care. I spoke with  Roda Shutters Shaikh's niece by phone today.  What matters to the patients health and wellness today?  Symptom Management and Housing    Goals Addressed             This Visit's Progress    COMPLETED: Obtain Supportive Resources-Caregiver Stress       Activities and task to complete in order to accomplish goals.   Keep all upcoming appointments discussed today Continue with compliance of taking medication prescribed by Doctor Implement healthy coping skills discussed to assist with management of symptoms          SDOH assessments and interventions completed:  No     Care Coordination Interventions:  Yes, provided  Interventions Today    Flowsheet Row Most Recent Value  Chronic Disease   Chronic disease during today's visit Chronic Kidney Disease/End Stage Renal Disease (ESRD), Other  [Depression and Memory Loss]  General Interventions   General Interventions Discussed/Reviewed General Interventions Reviewed, Doctor Visits  [Pt's memory has declined since move. Niece is calling daily and patient is adjusting well]  Doctor Visits Discussed/Reviewed Doctor Visits Reviewed  Mental Health Interventions   Mental Health Discussed/Reviewed Mental Health Reviewed, Coping Strategies  Nutrition Interventions   Nutrition Discussed/Reviewed Nutrition Reviewed  Pharmacy Interventions   Pharmacy Dicussed/Reviewed Pharmacy Topics Reviewed, Medication Adherence  [Decrease in rebound headaches after med adjustments from gabapentin and tramadol]  Safety Interventions   Safety Discussed/Reviewed Safety Reviewed       Follow up plan: No further intervention required.   Encounter Outcome:  Patient Visit Completed   Jenel Lucks, MSW, LCSW [facility] Care  Management [facility]  [facility] [email or web address] Phone [phone] 10:33 AM

## 2023-01-06 ENCOUNTER — Ambulatory Visit: Payer: Medicare PPO | Admitting: Neurology

## 2023-01-17 ENCOUNTER — Other Ambulatory Visit: Payer: Self-pay

## 2023-01-17 ENCOUNTER — Telehealth: Payer: Self-pay | Admitting: Licensed Clinical Social Worker

## 2023-01-17 ENCOUNTER — Telehealth: Payer: Self-pay | Admitting: Neurology

## 2023-01-17 ENCOUNTER — Other Ambulatory Visit: Payer: Self-pay | Admitting: Neurology

## 2023-01-17 MED ORDER — PREDNISONE 20 MG PO TABS: ORAL_TABLET | ORAL | 0 refills | Status: DC

## 2023-01-17 MED ORDER — PREDNISONE 20 MG PO TABS: ORAL_TABLET | ORAL | 0 refills | Status: AC

## 2023-01-17 NOTE — Telephone Encounter (Signed)
Pls let Diane know I sent in a prescription for Prednisone 20mg : Take 3 tabs on day 1, 2 and 1/2 tabs on day 2, 2 tabs on day 3, 1 and 1/2 tabs on day 4, 1 tab on day 5, 1/2 tab on days 6 and 7, then stop.

## 2023-01-17 NOTE — Telephone Encounter (Signed)
Pt c/o: seizure Missed medications?  No. Sleep deprived?  No. Alcohol intake?  No. Increased stress? Yes.   Any change in medication color or shape? No. Back to their usual baseline self?  No.. If no, advise go to ER Current medications prescribed by Dr. Karel Jarvis: none  [facility]  Headache cocktail not option

## 2023-01-17 NOTE — Telephone Encounter (Signed)
Pt's niece [name] called in and left a message with the access nurse on 01/15/23. She stated pt has headaches and gets shots in the head. The pt is having a severe headache. Wants to see if we are open.

## 2023-01-17 NOTE — Patient Outreach (Signed)
Care Coordination   Follow Up Visit Note   01/17/2023 Name: [name] MRN: [id number] DOB: 1929/11/24  [name] is a 89 y.o. year old female who sees Ngetich, Donalee Citrin, NP for primary care. I spoke with  Roda Shutters [facility]'s niece, Diane, by phone today.  What matters to the patients health and wellness today?  Medication/FL2 update    Goals Addressed             This Visit's Progress    COMPLETED: Obtain Supportive Resources-Caregiver Stress   On track    Activities and task to complete in order to accomplish goals.   Keep all upcoming appointments discussed today Continue with compliance of taking medication prescribed by Doctor Implement healthy coping skills discussed to assist with management of symptoms F/up with PCP office if [facility] needs a FL2 updated to include migraine meds          SDOH assessments and interventions completed:  No     Care Coordination Interventions:  Yes, provided  Interventions Today    Flowsheet Row Most Recent Value  Chronic Disease   Chronic disease during today's visit Chronic Obstructive Pulmonary Disease (COPD), Other  [Chronic migraines]  General Interventions   General Interventions Discussed/Reviewed General Interventions Reviewed, Doctor Visits  Doctor Visits Discussed/Reviewed Doctor Visits Reviewed  Pharmacy Interventions   Pharmacy Dicussed/Reviewed Pharmacy Topics Reviewed, Medication Adherence  [Pt's niece states that pt continues to suffer from migraines. Was prescribed new medication by Neurologist. Wants to ensure [facility] will administer meds w/o it being on FL2. Agreed to contact PCP, if needed]        Follow up plan: No further intervention required.   Encounter Outcome:  Patient Visit Completed   Jenel Lucks, MSW, LCSW [facility] Care Management [facility]  [facility] [email or web address] Phone [phone] 12:16 PM

## 2023-01-17 NOTE — Telephone Encounter (Signed)
Sent and called patients daughter

## 2023-01-17 NOTE — Telephone Encounter (Signed)
[facility], just to confirm, did she have a seizure or was it headaches she is having? Another option for ongoing severe headache is a week course of steroid, if Diane okay with that (and someone will give to patient), we can send it in. Thanks

## 2023-01-17 NOTE — Patient Instructions (Signed)
Visit Information  Thank you for taking time to visit with me today. Please don't hesitate to contact me if I can be of assistance to you.   Following are the goals we discussed today:   Goals Addressed             This Visit's Progress    COMPLETED: Obtain Supportive Resources-Caregiver Stress   On track    Activities and task to complete in order to accomplish goals.   Keep all upcoming appointments discussed today Continue with compliance of taking medication prescribed by Doctor Implement healthy coping skills discussed to assist with management of symptoms F/up with PCP office if [facility] needs a FL2 updated to include migraine meds          Please call the care guide team at [phone] if you need to cancel or reschedule your appointment.   If you are experiencing a Mental Health or Behavioral Health Crisis or need someone to talk to, please call the Suicide and Crisis Lifeline: 988 call 911   The patient verbalized understanding of instructions, educational materials, and care plan provided today and DECLINED offer to receive copy of patient instructions, educational materials, and care plan.   No further follow up required:    Jenel Lucks, MSW, LCSW [facility] Care Management [facility]  [facility] [email or web address] Phone [phone] 12:17 PM

## 2023-02-21 ENCOUNTER — Other Ambulatory Visit: Payer: Self-pay | Admitting: Family

## 2023-02-21 DIAGNOSIS — Z1231 Encounter for screening mammogram for malignant neoplasm of breast: Secondary | ICD-10-CM

## 2023-03-25 ENCOUNTER — Telehealth: Payer: Self-pay

## 2023-03-25 NOTE — Telephone Encounter (Signed)
Okay to schedule mammogram [id number] since last mammogram was done 04/29/2022.

## 2023-03-25 NOTE — Telephone Encounter (Signed)
Patient does not want to go since the last one was normal. Patient was unable to cancel the appointment unless you states its okay

## 2023-03-25 NOTE — Telephone Encounter (Signed)
Patient POA called and states that the patient was schedule a Mammogram 05/20/2023 for 1:50pm at [facility] in [place]. Her POA states she has had a mammogram this year and tried to cancel the upcoming one but they have to have an okay from Ngetich, Donalee Citrin, NP.

## 2023-05-03 DIAGNOSIS — F330 Major depressive disorder, recurrent, mild: Secondary | ICD-10-CM | POA: Diagnosis not present

## 2023-05-03 DIAGNOSIS — D519 Vitamin B12 deficiency anemia, unspecified: Secondary | ICD-10-CM | POA: Diagnosis not present

## 2023-05-03 DIAGNOSIS — R911 Solitary pulmonary nodule: Secondary | ICD-10-CM | POA: Diagnosis not present

## 2023-05-03 DIAGNOSIS — I10 Essential (primary) hypertension: Secondary | ICD-10-CM | POA: Diagnosis not present

## 2023-05-03 DIAGNOSIS — E785 Hyperlipidemia, unspecified: Secondary | ICD-10-CM | POA: Diagnosis not present

## 2023-05-03 DIAGNOSIS — G43009 Migraine without aura, not intractable, without status migrainosus: Secondary | ICD-10-CM | POA: Diagnosis not present

## 2023-05-03 DIAGNOSIS — F411 Generalized anxiety disorder: Secondary | ICD-10-CM | POA: Diagnosis not present

## 2023-05-03 DIAGNOSIS — M159 Polyosteoarthritis, unspecified: Secondary | ICD-10-CM | POA: Diagnosis not present

## 2023-05-10 DIAGNOSIS — F4321 Adjustment disorder with depressed mood: Secondary | ICD-10-CM | POA: Diagnosis not present

## 2023-05-12 DIAGNOSIS — G43809 Other migraine, not intractable, without status migrainosus: Secondary | ICD-10-CM | POA: Diagnosis not present

## 2023-05-12 DIAGNOSIS — E782 Mixed hyperlipidemia: Secondary | ICD-10-CM | POA: Diagnosis not present

## 2023-05-19 ENCOUNTER — Ambulatory Visit: Payer: Medicare PPO

## 2023-05-19 ENCOUNTER — Other Ambulatory Visit: Payer: Medicare PPO

## 2023-05-20 ENCOUNTER — Other Ambulatory Visit: Payer: Medicare PPO

## 2023-05-20 DIAGNOSIS — L03116 Cellulitis of left lower limb: Secondary | ICD-10-CM | POA: Diagnosis not present

## 2023-05-23 DIAGNOSIS — I10 Essential (primary) hypertension: Secondary | ICD-10-CM | POA: Diagnosis not present

## 2023-05-23 DIAGNOSIS — E785 Hyperlipidemia, unspecified: Secondary | ICD-10-CM | POA: Diagnosis not present

## 2023-05-23 DIAGNOSIS — D519 Vitamin B12 deficiency anemia, unspecified: Secondary | ICD-10-CM | POA: Diagnosis not present

## 2023-05-23 DIAGNOSIS — E559 Vitamin D deficiency, unspecified: Secondary | ICD-10-CM | POA: Diagnosis not present

## 2023-05-24 ENCOUNTER — Ambulatory Visit: Payer: Medicare PPO | Admitting: Family

## 2023-05-24 DIAGNOSIS — F4321 Adjustment disorder with depressed mood: Secondary | ICD-10-CM | POA: Diagnosis not present

## 2023-05-27 ENCOUNTER — Ambulatory Visit: Payer: Medicare PPO | Admitting: Family

## 2023-05-27 DIAGNOSIS — R350 Frequency of micturition: Secondary | ICD-10-CM | POA: Diagnosis not present

## 2023-05-27 DIAGNOSIS — L209 Atopic dermatitis, unspecified: Secondary | ICD-10-CM | POA: Diagnosis not present

## 2023-05-27 DIAGNOSIS — D519 Vitamin B12 deficiency anemia, unspecified: Secondary | ICD-10-CM | POA: Diagnosis not present

## 2023-05-27 DIAGNOSIS — L03116 Cellulitis of left lower limb: Secondary | ICD-10-CM | POA: Diagnosis not present

## 2023-05-31 DIAGNOSIS — G43009 Migraine without aura, not intractable, without status migrainosus: Secondary | ICD-10-CM | POA: Diagnosis not present

## 2023-05-31 DIAGNOSIS — E782 Mixed hyperlipidemia: Secondary | ICD-10-CM | POA: Diagnosis not present

## 2023-05-31 DIAGNOSIS — R21 Rash and other nonspecific skin eruption: Secondary | ICD-10-CM | POA: Diagnosis not present

## 2023-05-31 DIAGNOSIS — R300 Dysuria: Secondary | ICD-10-CM | POA: Diagnosis not present

## 2023-05-31 DIAGNOSIS — I10 Essential (primary) hypertension: Secondary | ICD-10-CM | POA: Diagnosis not present

## 2023-06-07 DIAGNOSIS — F4321 Adjustment disorder with depressed mood: Secondary | ICD-10-CM | POA: Diagnosis not present

## 2023-06-13 DIAGNOSIS — M25561 Pain in right knee: Secondary | ICD-10-CM | POA: Diagnosis not present

## 2023-06-13 DIAGNOSIS — F331 Major depressive disorder, recurrent, moderate: Secondary | ICD-10-CM | POA: Diagnosis not present

## 2023-06-13 DIAGNOSIS — F4321 Adjustment disorder with depressed mood: Secondary | ICD-10-CM | POA: Diagnosis not present

## 2023-06-13 DIAGNOSIS — M1712 Unilateral primary osteoarthritis, left knee: Secondary | ICD-10-CM | POA: Diagnosis not present

## 2023-06-16 DIAGNOSIS — M159 Polyosteoarthritis, unspecified: Secondary | ICD-10-CM | POA: Diagnosis not present

## 2023-06-16 DIAGNOSIS — D519 Vitamin B12 deficiency anemia, unspecified: Secondary | ICD-10-CM | POA: Diagnosis not present

## 2023-06-20 DIAGNOSIS — R269 Unspecified abnormalities of gait and mobility: Secondary | ICD-10-CM | POA: Diagnosis not present

## 2023-06-20 DIAGNOSIS — F4321 Adjustment disorder with depressed mood: Secondary | ICD-10-CM | POA: Diagnosis not present

## 2023-06-20 DIAGNOSIS — I10 Essential (primary) hypertension: Secondary | ICD-10-CM | POA: Diagnosis not present

## 2023-06-20 DIAGNOSIS — D519 Vitamin B12 deficiency anemia, unspecified: Secondary | ICD-10-CM | POA: Diagnosis not present

## 2023-06-20 DIAGNOSIS — M159 Polyosteoarthritis, unspecified: Secondary | ICD-10-CM | POA: Diagnosis not present

## 2023-06-27 DIAGNOSIS — F330 Major depressive disorder, recurrent, mild: Secondary | ICD-10-CM | POA: Diagnosis not present

## 2023-06-27 DIAGNOSIS — F4321 Adjustment disorder with depressed mood: Secondary | ICD-10-CM | POA: Diagnosis not present

## 2023-06-28 DIAGNOSIS — F411 Generalized anxiety disorder: Secondary | ICD-10-CM | POA: Diagnosis not present

## 2023-06-28 DIAGNOSIS — F4321 Adjustment disorder with depressed mood: Secondary | ICD-10-CM | POA: Diagnosis not present

## 2023-06-28 DIAGNOSIS — D519 Vitamin B12 deficiency anemia, unspecified: Secondary | ICD-10-CM | POA: Diagnosis not present

## 2023-06-28 DIAGNOSIS — R059 Cough, unspecified: Secondary | ICD-10-CM | POA: Diagnosis not present

## 2023-07-08 DIAGNOSIS — F4321 Adjustment disorder with depressed mood: Secondary | ICD-10-CM | POA: Diagnosis not present

## 2023-07-15 ENCOUNTER — Ambulatory Visit: Payer: Medicare PPO | Admitting: Neurology

## 2023-07-18 DIAGNOSIS — F330 Major depressive disorder, recurrent, mild: Secondary | ICD-10-CM | POA: Diagnosis not present

## 2023-07-18 DIAGNOSIS — R54 Age-related physical debility: Secondary | ICD-10-CM | POA: Diagnosis not present

## 2023-07-21 DIAGNOSIS — N390 Urinary tract infection, site not specified: Secondary | ICD-10-CM | POA: Diagnosis not present

## 2023-07-26 DIAGNOSIS — A498 Other bacterial infections of unspecified site: Secondary | ICD-10-CM | POA: Diagnosis not present

## 2023-07-26 DIAGNOSIS — R2681 Unsteadiness on feet: Secondary | ICD-10-CM | POA: Diagnosis not present

## 2023-07-26 DIAGNOSIS — N390 Urinary tract infection, site not specified: Secondary | ICD-10-CM | POA: Diagnosis not present

## 2023-07-26 DIAGNOSIS — R911 Solitary pulmonary nodule: Secondary | ICD-10-CM | POA: Diagnosis not present

## 2023-07-26 DIAGNOSIS — M1712 Unilateral primary osteoarthritis, left knee: Secondary | ICD-10-CM | POA: Diagnosis not present

## 2023-07-29 DIAGNOSIS — H9203 Otalgia, bilateral: Secondary | ICD-10-CM | POA: Diagnosis not present

## 2023-07-29 DIAGNOSIS — H6123 Impacted cerumen, bilateral: Secondary | ICD-10-CM | POA: Diagnosis not present

## 2023-08-02 DIAGNOSIS — R079 Chest pain, unspecified: Secondary | ICD-10-CM | POA: Diagnosis not present

## 2023-08-02 DIAGNOSIS — R55 Syncope and collapse: Secondary | ICD-10-CM | POA: Diagnosis not present

## 2023-08-02 DIAGNOSIS — W19XXXA Unspecified fall, initial encounter: Secondary | ICD-10-CM | POA: Diagnosis not present

## 2023-08-02 DIAGNOSIS — M542 Cervicalgia: Secondary | ICD-10-CM | POA: Diagnosis not present

## 2023-08-02 DIAGNOSIS — M25511 Pain in right shoulder: Secondary | ICD-10-CM | POA: Diagnosis not present

## 2023-08-02 DIAGNOSIS — M79652 Pain in left thigh: Secondary | ICD-10-CM | POA: Diagnosis not present

## 2023-08-02 DIAGNOSIS — I10 Essential (primary) hypertension: Secondary | ICD-10-CM | POA: Diagnosis not present

## 2023-08-02 DIAGNOSIS — S0990XA Unspecified injury of head, initial encounter: Secondary | ICD-10-CM | POA: Diagnosis not present

## 2023-08-02 DIAGNOSIS — S5000XA Contusion of unspecified elbow, initial encounter: Secondary | ICD-10-CM | POA: Diagnosis not present

## 2023-08-03 DIAGNOSIS — D381 Neoplasm of uncertain behavior of trachea, bronchus and lung: Secondary | ICD-10-CM | POA: Diagnosis not present

## 2023-08-03 DIAGNOSIS — R079 Chest pain, unspecified: Secondary | ICD-10-CM | POA: Diagnosis not present

## 2023-08-03 DIAGNOSIS — S42031A Displaced fracture of lateral end of right clavicle, initial encounter for closed fracture: Secondary | ICD-10-CM | POA: Diagnosis not present

## 2023-08-03 DIAGNOSIS — M79652 Pain in left thigh: Secondary | ICD-10-CM | POA: Diagnosis not present

## 2023-08-03 DIAGNOSIS — M25511 Pain in right shoulder: Secondary | ICD-10-CM | POA: Diagnosis not present

## 2023-08-03 DIAGNOSIS — R918 Other nonspecific abnormal finding of lung field: Secondary | ICD-10-CM | POA: Diagnosis not present

## 2023-08-03 DIAGNOSIS — R296 Repeated falls: Secondary | ICD-10-CM | POA: Diagnosis not present

## 2023-08-03 DIAGNOSIS — S0990XA Unspecified injury of head, initial encounter: Secondary | ICD-10-CM | POA: Diagnosis not present

## 2023-08-03 DIAGNOSIS — S32810A Multiple fractures of pelvis with stable disruption of pelvic ring, initial encounter for closed fracture: Secondary | ICD-10-CM | POA: Diagnosis not present

## 2023-08-03 DIAGNOSIS — E871 Hypo-osmolality and hyponatremia: Secondary | ICD-10-CM | POA: Diagnosis not present

## 2023-08-04 DIAGNOSIS — S42031A Displaced fracture of lateral end of right clavicle, initial encounter for closed fracture: Secondary | ICD-10-CM | POA: Diagnosis not present

## 2023-08-04 DIAGNOSIS — E871 Hypo-osmolality and hyponatremia: Secondary | ICD-10-CM | POA: Diagnosis not present

## 2023-08-04 DIAGNOSIS — R911 Solitary pulmonary nodule: Secondary | ICD-10-CM | POA: Diagnosis not present

## 2023-08-04 DIAGNOSIS — S32810A Multiple fractures of pelvis with stable disruption of pelvic ring, initial encounter for closed fracture: Secondary | ICD-10-CM | POA: Diagnosis not present

## 2023-08-04 DIAGNOSIS — R296 Repeated falls: Secondary | ICD-10-CM | POA: Diagnosis not present

## 2023-08-04 DIAGNOSIS — R918 Other nonspecific abnormal finding of lung field: Secondary | ICD-10-CM | POA: Diagnosis not present

## 2023-08-05 DIAGNOSIS — R296 Repeated falls: Secondary | ICD-10-CM | POA: Diagnosis not present

## 2023-08-05 DIAGNOSIS — R918 Other nonspecific abnormal finding of lung field: Secondary | ICD-10-CM | POA: Diagnosis not present

## 2023-08-05 DIAGNOSIS — R42 Dizziness and giddiness: Secondary | ICD-10-CM | POA: Diagnosis not present

## 2023-08-05 DIAGNOSIS — E871 Hypo-osmolality and hyponatremia: Secondary | ICD-10-CM | POA: Diagnosis not present

## 2023-08-06 DIAGNOSIS — R296 Repeated falls: Secondary | ICD-10-CM | POA: Diagnosis not present

## 2023-08-06 DIAGNOSIS — E871 Hypo-osmolality and hyponatremia: Secondary | ICD-10-CM | POA: Diagnosis not present

## 2023-08-06 DIAGNOSIS — R918 Other nonspecific abnormal finding of lung field: Secondary | ICD-10-CM | POA: Diagnosis not present

## 2023-08-07 DIAGNOSIS — R296 Repeated falls: Secondary | ICD-10-CM | POA: Diagnosis not present

## 2023-08-07 DIAGNOSIS — E871 Hypo-osmolality and hyponatremia: Secondary | ICD-10-CM | POA: Diagnosis not present

## 2023-08-07 DIAGNOSIS — R918 Other nonspecific abnormal finding of lung field: Secondary | ICD-10-CM | POA: Diagnosis not present

## 2023-08-08 DIAGNOSIS — E871 Hypo-osmolality and hyponatremia: Secondary | ICD-10-CM | POA: Diagnosis not present

## 2023-08-08 DIAGNOSIS — R918 Other nonspecific abnormal finding of lung field: Secondary | ICD-10-CM | POA: Diagnosis not present

## 2023-08-08 DIAGNOSIS — R296 Repeated falls: Secondary | ICD-10-CM | POA: Diagnosis not present

## 2023-08-09 DIAGNOSIS — R918 Other nonspecific abnormal finding of lung field: Secondary | ICD-10-CM | POA: Diagnosis not present

## 2023-08-10 DIAGNOSIS — F32A Depression, unspecified: Secondary | ICD-10-CM | POA: Diagnosis not present

## 2023-08-10 DIAGNOSIS — K5900 Constipation, unspecified: Secondary | ICD-10-CM | POA: Diagnosis not present

## 2023-08-10 DIAGNOSIS — R54 Age-related physical debility: Secondary | ICD-10-CM | POA: Diagnosis not present

## 2023-08-10 DIAGNOSIS — E039 Hypothyroidism, unspecified: Secondary | ICD-10-CM | POA: Diagnosis not present

## 2023-08-10 DIAGNOSIS — M1712 Unilateral primary osteoarthritis, left knee: Secondary | ICD-10-CM | POA: Diagnosis not present

## 2023-08-10 DIAGNOSIS — I10 Essential (primary) hypertension: Secondary | ICD-10-CM | POA: Diagnosis not present

## 2023-08-10 DIAGNOSIS — E785 Hyperlipidemia, unspecified: Secondary | ICD-10-CM | POA: Diagnosis not present

## 2023-08-10 DIAGNOSIS — G8911 Acute pain due to trauma: Secondary | ICD-10-CM | POA: Diagnosis not present

## 2023-08-10 DIAGNOSIS — R918 Other nonspecific abnormal finding of lung field: Secondary | ICD-10-CM | POA: Diagnosis not present

## 2023-08-10 DIAGNOSIS — M50021 Cervical disc disorder at C4-C5 level with myelopathy: Secondary | ICD-10-CM | POA: Diagnosis not present

## 2023-08-10 DIAGNOSIS — J40 Bronchitis, not specified as acute or chronic: Secondary | ICD-10-CM | POA: Diagnosis not present

## 2023-08-11 DIAGNOSIS — F4321 Adjustment disorder with depressed mood: Secondary | ICD-10-CM | POA: Diagnosis not present

## 2023-08-15 DIAGNOSIS — K5900 Constipation, unspecified: Secondary | ICD-10-CM | POA: Diagnosis not present

## 2023-08-15 DIAGNOSIS — I10 Essential (primary) hypertension: Secondary | ICD-10-CM | POA: Diagnosis not present

## 2023-08-15 DIAGNOSIS — F32A Depression, unspecified: Secondary | ICD-10-CM | POA: Diagnosis not present

## 2023-08-15 DIAGNOSIS — R918 Other nonspecific abnormal finding of lung field: Secondary | ICD-10-CM | POA: Diagnosis not present

## 2023-08-15 DIAGNOSIS — E785 Hyperlipidemia, unspecified: Secondary | ICD-10-CM | POA: Diagnosis not present

## 2023-08-15 DIAGNOSIS — M1712 Unilateral primary osteoarthritis, left knee: Secondary | ICD-10-CM | POA: Diagnosis not present

## 2023-08-15 DIAGNOSIS — M50021 Cervical disc disorder at C4-C5 level with myelopathy: Secondary | ICD-10-CM | POA: Diagnosis not present

## 2023-08-15 DIAGNOSIS — E039 Hypothyroidism, unspecified: Secondary | ICD-10-CM | POA: Diagnosis not present

## 2023-08-15 DIAGNOSIS — G8911 Acute pain due to trauma: Secondary | ICD-10-CM | POA: Diagnosis not present

## 2023-08-16 DIAGNOSIS — I10 Essential (primary) hypertension: Secondary | ICD-10-CM | POA: Diagnosis not present

## 2023-08-16 DIAGNOSIS — K5900 Constipation, unspecified: Secondary | ICD-10-CM | POA: Diagnosis not present

## 2023-08-16 DIAGNOSIS — S0083XA Contusion of other part of head, initial encounter: Secondary | ICD-10-CM | POA: Diagnosis not present

## 2023-08-16 DIAGNOSIS — M50021 Cervical disc disorder at C4-C5 level with myelopathy: Secondary | ICD-10-CM | POA: Diagnosis not present

## 2023-08-16 DIAGNOSIS — M25511 Pain in right shoulder: Secondary | ICD-10-CM | POA: Diagnosis not present

## 2023-08-16 DIAGNOSIS — R911 Solitary pulmonary nodule: Secondary | ICD-10-CM | POA: Diagnosis not present

## 2023-08-16 DIAGNOSIS — E785 Hyperlipidemia, unspecified: Secondary | ICD-10-CM | POA: Diagnosis not present

## 2023-08-16 DIAGNOSIS — J40 Bronchitis, not specified as acute or chronic: Secondary | ICD-10-CM | POA: Diagnosis not present

## 2023-08-16 DIAGNOSIS — E039 Hypothyroidism, unspecified: Secondary | ICD-10-CM | POA: Diagnosis not present

## 2023-08-16 DIAGNOSIS — R918 Other nonspecific abnormal finding of lung field: Secondary | ICD-10-CM | POA: Diagnosis not present

## 2023-08-16 DIAGNOSIS — M1712 Unilateral primary osteoarthritis, left knee: Secondary | ICD-10-CM | POA: Diagnosis not present

## 2023-08-16 DIAGNOSIS — R296 Repeated falls: Secondary | ICD-10-CM | POA: Diagnosis not present

## 2023-08-16 DIAGNOSIS — M6281 Muscle weakness (generalized): Secondary | ICD-10-CM | POA: Diagnosis not present

## 2023-08-16 DIAGNOSIS — G8911 Acute pain due to trauma: Secondary | ICD-10-CM | POA: Diagnosis not present

## 2023-08-16 DIAGNOSIS — F32A Depression, unspecified: Secondary | ICD-10-CM | POA: Diagnosis not present

## 2023-08-19 DIAGNOSIS — R911 Solitary pulmonary nodule: Secondary | ICD-10-CM | POA: Diagnosis not present

## 2023-08-23 DIAGNOSIS — I10 Essential (primary) hypertension: Secondary | ICD-10-CM | POA: Diagnosis not present

## 2023-08-23 DIAGNOSIS — R918 Other nonspecific abnormal finding of lung field: Secondary | ICD-10-CM | POA: Diagnosis not present

## 2023-08-23 DIAGNOSIS — E039 Hypothyroidism, unspecified: Secondary | ICD-10-CM | POA: Diagnosis not present

## 2023-08-23 DIAGNOSIS — G8911 Acute pain due to trauma: Secondary | ICD-10-CM | POA: Diagnosis not present

## 2023-08-23 DIAGNOSIS — E785 Hyperlipidemia, unspecified: Secondary | ICD-10-CM | POA: Diagnosis not present

## 2023-08-23 DIAGNOSIS — M1712 Unilateral primary osteoarthritis, left knee: Secondary | ICD-10-CM | POA: Diagnosis not present

## 2023-08-23 DIAGNOSIS — M50021 Cervical disc disorder at C4-C5 level with myelopathy: Secondary | ICD-10-CM | POA: Diagnosis not present

## 2023-08-23 DIAGNOSIS — F32A Depression, unspecified: Secondary | ICD-10-CM | POA: Diagnosis not present

## 2023-08-23 DIAGNOSIS — K5900 Constipation, unspecified: Secondary | ICD-10-CM | POA: Diagnosis not present

## 2023-08-24 DIAGNOSIS — R918 Other nonspecific abnormal finding of lung field: Secondary | ICD-10-CM | POA: Diagnosis not present

## 2023-08-24 DIAGNOSIS — F32A Depression, unspecified: Secondary | ICD-10-CM | POA: Diagnosis not present

## 2023-08-24 DIAGNOSIS — M1712 Unilateral primary osteoarthritis, left knee: Secondary | ICD-10-CM | POA: Diagnosis not present

## 2023-08-24 DIAGNOSIS — E785 Hyperlipidemia, unspecified: Secondary | ICD-10-CM | POA: Diagnosis not present

## 2023-08-24 DIAGNOSIS — I10 Essential (primary) hypertension: Secondary | ICD-10-CM | POA: Diagnosis not present

## 2023-08-24 DIAGNOSIS — K5900 Constipation, unspecified: Secondary | ICD-10-CM | POA: Diagnosis not present

## 2023-08-24 DIAGNOSIS — E039 Hypothyroidism, unspecified: Secondary | ICD-10-CM | POA: Diagnosis not present

## 2023-08-24 DIAGNOSIS — G8911 Acute pain due to trauma: Secondary | ICD-10-CM | POA: Diagnosis not present

## 2023-08-24 DIAGNOSIS — M50021 Cervical disc disorder at C4-C5 level with myelopathy: Secondary | ICD-10-CM | POA: Diagnosis not present

## 2023-08-30 DIAGNOSIS — R918 Other nonspecific abnormal finding of lung field: Secondary | ICD-10-CM | POA: Diagnosis not present

## 2023-08-30 DIAGNOSIS — M1712 Unilateral primary osteoarthritis, left knee: Secondary | ICD-10-CM | POA: Diagnosis not present

## 2023-08-30 DIAGNOSIS — F32A Depression, unspecified: Secondary | ICD-10-CM | POA: Diagnosis not present

## 2023-08-30 DIAGNOSIS — K5900 Constipation, unspecified: Secondary | ICD-10-CM | POA: Diagnosis not present

## 2023-08-30 DIAGNOSIS — E039 Hypothyroidism, unspecified: Secondary | ICD-10-CM | POA: Diagnosis not present

## 2023-08-30 DIAGNOSIS — G8911 Acute pain due to trauma: Secondary | ICD-10-CM | POA: Diagnosis not present

## 2023-08-30 DIAGNOSIS — E785 Hyperlipidemia, unspecified: Secondary | ICD-10-CM | POA: Diagnosis not present

## 2023-08-30 DIAGNOSIS — M50021 Cervical disc disorder at C4-C5 level with myelopathy: Secondary | ICD-10-CM | POA: Diagnosis not present

## 2023-08-30 DIAGNOSIS — I10 Essential (primary) hypertension: Secondary | ICD-10-CM | POA: Diagnosis not present

## 2023-09-03 DIAGNOSIS — F32A Depression, unspecified: Secondary | ICD-10-CM | POA: Diagnosis not present

## 2023-09-03 DIAGNOSIS — K5900 Constipation, unspecified: Secondary | ICD-10-CM | POA: Diagnosis not present

## 2023-09-03 DIAGNOSIS — M50021 Cervical disc disorder at C4-C5 level with myelopathy: Secondary | ICD-10-CM | POA: Diagnosis not present

## 2023-09-03 DIAGNOSIS — E785 Hyperlipidemia, unspecified: Secondary | ICD-10-CM | POA: Diagnosis not present

## 2023-09-03 DIAGNOSIS — R918 Other nonspecific abnormal finding of lung field: Secondary | ICD-10-CM | POA: Diagnosis not present

## 2023-09-03 DIAGNOSIS — M1712 Unilateral primary osteoarthritis, left knee: Secondary | ICD-10-CM | POA: Diagnosis not present

## 2023-09-03 DIAGNOSIS — G8911 Acute pain due to trauma: Secondary | ICD-10-CM | POA: Diagnosis not present

## 2023-09-03 DIAGNOSIS — E039 Hypothyroidism, unspecified: Secondary | ICD-10-CM | POA: Diagnosis not present

## 2023-09-03 DIAGNOSIS — I10 Essential (primary) hypertension: Secondary | ICD-10-CM | POA: Diagnosis not present

## 2023-09-05 DIAGNOSIS — F32A Depression, unspecified: Secondary | ICD-10-CM | POA: Diagnosis not present

## 2023-09-05 DIAGNOSIS — M50021 Cervical disc disorder at C4-C5 level with myelopathy: Secondary | ICD-10-CM | POA: Diagnosis not present

## 2023-09-05 DIAGNOSIS — I10 Essential (primary) hypertension: Secondary | ICD-10-CM | POA: Diagnosis not present

## 2023-09-05 DIAGNOSIS — M1712 Unilateral primary osteoarthritis, left knee: Secondary | ICD-10-CM | POA: Diagnosis not present

## 2023-09-05 DIAGNOSIS — E785 Hyperlipidemia, unspecified: Secondary | ICD-10-CM | POA: Diagnosis not present

## 2023-09-05 DIAGNOSIS — E039 Hypothyroidism, unspecified: Secondary | ICD-10-CM | POA: Diagnosis not present

## 2023-09-05 DIAGNOSIS — K5900 Constipation, unspecified: Secondary | ICD-10-CM | POA: Diagnosis not present

## 2023-09-05 DIAGNOSIS — R918 Other nonspecific abnormal finding of lung field: Secondary | ICD-10-CM | POA: Diagnosis not present

## 2023-09-05 DIAGNOSIS — G8911 Acute pain due to trauma: Secondary | ICD-10-CM | POA: Diagnosis not present

## 2023-09-07 DIAGNOSIS — M50021 Cervical disc disorder at C4-C5 level with myelopathy: Secondary | ICD-10-CM | POA: Diagnosis not present

## 2023-09-07 DIAGNOSIS — G8911 Acute pain due to trauma: Secondary | ICD-10-CM | POA: Diagnosis not present

## 2023-09-07 DIAGNOSIS — I10 Essential (primary) hypertension: Secondary | ICD-10-CM | POA: Diagnosis not present

## 2023-09-08 DIAGNOSIS — R918 Other nonspecific abnormal finding of lung field: Secondary | ICD-10-CM | POA: Diagnosis not present

## 2023-09-08 DIAGNOSIS — E039 Hypothyroidism, unspecified: Secondary | ICD-10-CM | POA: Diagnosis not present

## 2023-09-08 DIAGNOSIS — K5900 Constipation, unspecified: Secondary | ICD-10-CM | POA: Diagnosis not present

## 2023-09-08 DIAGNOSIS — M1712 Unilateral primary osteoarthritis, left knee: Secondary | ICD-10-CM | POA: Diagnosis not present

## 2023-09-08 DIAGNOSIS — E785 Hyperlipidemia, unspecified: Secondary | ICD-10-CM | POA: Diagnosis not present

## 2023-09-08 DIAGNOSIS — M50021 Cervical disc disorder at C4-C5 level with myelopathy: Secondary | ICD-10-CM | POA: Diagnosis not present

## 2023-09-08 DIAGNOSIS — G8911 Acute pain due to trauma: Secondary | ICD-10-CM | POA: Diagnosis not present

## 2023-09-08 DIAGNOSIS — F32A Depression, unspecified: Secondary | ICD-10-CM | POA: Diagnosis not present

## 2023-09-08 DIAGNOSIS — I10 Essential (primary) hypertension: Secondary | ICD-10-CM | POA: Diagnosis not present

## 2023-09-09 DIAGNOSIS — J40 Bronchitis, not specified as acute or chronic: Secondary | ICD-10-CM | POA: Diagnosis not present

## 2023-09-09 DIAGNOSIS — F4321 Adjustment disorder with depressed mood: Secondary | ICD-10-CM | POA: Diagnosis not present

## 2023-09-09 DIAGNOSIS — F32A Depression, unspecified: Secondary | ICD-10-CM | POA: Diagnosis not present

## 2023-09-09 DIAGNOSIS — M1712 Unilateral primary osteoarthritis, left knee: Secondary | ICD-10-CM | POA: Diagnosis not present

## 2023-09-09 DIAGNOSIS — K5900 Constipation, unspecified: Secondary | ICD-10-CM | POA: Diagnosis not present

## 2023-09-09 DIAGNOSIS — R918 Other nonspecific abnormal finding of lung field: Secondary | ICD-10-CM | POA: Diagnosis not present

## 2023-09-09 DIAGNOSIS — I10 Essential (primary) hypertension: Secondary | ICD-10-CM | POA: Diagnosis not present

## 2023-09-09 DIAGNOSIS — E785 Hyperlipidemia, unspecified: Secondary | ICD-10-CM | POA: Diagnosis not present

## 2023-09-09 DIAGNOSIS — E039 Hypothyroidism, unspecified: Secondary | ICD-10-CM | POA: Diagnosis not present

## 2023-09-09 DIAGNOSIS — M50021 Cervical disc disorder at C4-C5 level with myelopathy: Secondary | ICD-10-CM | POA: Diagnosis not present

## 2023-09-09 DIAGNOSIS — G8911 Acute pain due to trauma: Secondary | ICD-10-CM | POA: Diagnosis not present

## 2023-09-09 DIAGNOSIS — I119 Hypertensive heart disease without heart failure: Secondary | ICD-10-CM | POA: Diagnosis not present

## 2023-09-13 DIAGNOSIS — G3184 Mild cognitive impairment, so stated: Secondary | ICD-10-CM | POA: Diagnosis not present

## 2023-09-13 DIAGNOSIS — E785 Hyperlipidemia, unspecified: Secondary | ICD-10-CM | POA: Diagnosis not present

## 2023-09-13 DIAGNOSIS — I119 Hypertensive heart disease without heart failure: Secondary | ICD-10-CM | POA: Diagnosis not present

## 2023-09-13 DIAGNOSIS — F4321 Adjustment disorder with depressed mood: Secondary | ICD-10-CM | POA: Diagnosis not present

## 2023-09-14 DIAGNOSIS — E785 Hyperlipidemia, unspecified: Secondary | ICD-10-CM | POA: Diagnosis not present

## 2023-09-14 DIAGNOSIS — E039 Hypothyroidism, unspecified: Secondary | ICD-10-CM | POA: Diagnosis not present

## 2023-09-14 DIAGNOSIS — M50021 Cervical disc disorder at C4-C5 level with myelopathy: Secondary | ICD-10-CM | POA: Diagnosis not present

## 2023-09-14 DIAGNOSIS — R918 Other nonspecific abnormal finding of lung field: Secondary | ICD-10-CM | POA: Diagnosis not present

## 2023-09-14 DIAGNOSIS — K5900 Constipation, unspecified: Secondary | ICD-10-CM | POA: Diagnosis not present

## 2023-09-14 DIAGNOSIS — G8911 Acute pain due to trauma: Secondary | ICD-10-CM | POA: Diagnosis not present

## 2023-09-14 DIAGNOSIS — F32A Depression, unspecified: Secondary | ICD-10-CM | POA: Diagnosis not present

## 2023-09-14 DIAGNOSIS — M1712 Unilateral primary osteoarthritis, left knee: Secondary | ICD-10-CM | POA: Diagnosis not present

## 2023-09-14 DIAGNOSIS — I10 Essential (primary) hypertension: Secondary | ICD-10-CM | POA: Diagnosis not present

## 2023-09-15 DIAGNOSIS — R918 Other nonspecific abnormal finding of lung field: Secondary | ICD-10-CM | POA: Diagnosis not present

## 2023-09-15 DIAGNOSIS — I10 Essential (primary) hypertension: Secondary | ICD-10-CM | POA: Diagnosis not present

## 2023-09-15 DIAGNOSIS — M1712 Unilateral primary osteoarthritis, left knee: Secondary | ICD-10-CM | POA: Diagnosis not present

## 2023-09-15 DIAGNOSIS — E785 Hyperlipidemia, unspecified: Secondary | ICD-10-CM | POA: Diagnosis not present

## 2023-09-15 DIAGNOSIS — K5900 Constipation, unspecified: Secondary | ICD-10-CM | POA: Diagnosis not present

## 2023-09-15 DIAGNOSIS — M50021 Cervical disc disorder at C4-C5 level with myelopathy: Secondary | ICD-10-CM | POA: Diagnosis not present

## 2023-09-15 DIAGNOSIS — G8911 Acute pain due to trauma: Secondary | ICD-10-CM | POA: Diagnosis not present

## 2023-09-15 DIAGNOSIS — F32A Depression, unspecified: Secondary | ICD-10-CM | POA: Diagnosis not present

## 2023-09-15 DIAGNOSIS — E039 Hypothyroidism, unspecified: Secondary | ICD-10-CM | POA: Diagnosis not present

## 2023-09-19 DIAGNOSIS — M25511 Pain in right shoulder: Secondary | ICD-10-CM | POA: Diagnosis not present

## 2023-09-20 DIAGNOSIS — F32A Depression, unspecified: Secondary | ICD-10-CM | POA: Diagnosis not present

## 2023-09-20 DIAGNOSIS — I10 Essential (primary) hypertension: Secondary | ICD-10-CM | POA: Diagnosis not present

## 2023-09-20 DIAGNOSIS — K5900 Constipation, unspecified: Secondary | ICD-10-CM | POA: Diagnosis not present

## 2023-09-20 DIAGNOSIS — E785 Hyperlipidemia, unspecified: Secondary | ICD-10-CM | POA: Diagnosis not present

## 2023-09-20 DIAGNOSIS — R918 Other nonspecific abnormal finding of lung field: Secondary | ICD-10-CM | POA: Diagnosis not present

## 2023-09-20 DIAGNOSIS — G8911 Acute pain due to trauma: Secondary | ICD-10-CM | POA: Diagnosis not present

## 2023-09-20 DIAGNOSIS — E039 Hypothyroidism, unspecified: Secondary | ICD-10-CM | POA: Diagnosis not present

## 2023-09-20 DIAGNOSIS — M50021 Cervical disc disorder at C4-C5 level with myelopathy: Secondary | ICD-10-CM | POA: Diagnosis not present

## 2023-09-20 DIAGNOSIS — M1712 Unilateral primary osteoarthritis, left knee: Secondary | ICD-10-CM | POA: Diagnosis not present

## 2023-09-21 DIAGNOSIS — E039 Hypothyroidism, unspecified: Secondary | ICD-10-CM | POA: Diagnosis not present

## 2023-09-21 DIAGNOSIS — R918 Other nonspecific abnormal finding of lung field: Secondary | ICD-10-CM | POA: Diagnosis not present

## 2023-09-21 DIAGNOSIS — E785 Hyperlipidemia, unspecified: Secondary | ICD-10-CM | POA: Diagnosis not present

## 2023-09-21 DIAGNOSIS — G8911 Acute pain due to trauma: Secondary | ICD-10-CM | POA: Diagnosis not present

## 2023-09-21 DIAGNOSIS — M50021 Cervical disc disorder at C4-C5 level with myelopathy: Secondary | ICD-10-CM | POA: Diagnosis not present

## 2023-09-21 DIAGNOSIS — F32A Depression, unspecified: Secondary | ICD-10-CM | POA: Diagnosis not present

## 2023-09-21 DIAGNOSIS — K5900 Constipation, unspecified: Secondary | ICD-10-CM | POA: Diagnosis not present

## 2023-09-21 DIAGNOSIS — M1712 Unilateral primary osteoarthritis, left knee: Secondary | ICD-10-CM | POA: Diagnosis not present

## 2023-09-21 DIAGNOSIS — I10 Essential (primary) hypertension: Secondary | ICD-10-CM | POA: Diagnosis not present

## 2023-09-26 DIAGNOSIS — F411 Generalized anxiety disorder: Secondary | ICD-10-CM | POA: Diagnosis not present

## 2023-09-26 DIAGNOSIS — F320 Major depressive disorder, single episode, mild: Secondary | ICD-10-CM | POA: Diagnosis not present

## 2023-09-28 DIAGNOSIS — E785 Hyperlipidemia, unspecified: Secondary | ICD-10-CM | POA: Diagnosis not present

## 2023-09-28 DIAGNOSIS — K5900 Constipation, unspecified: Secondary | ICD-10-CM | POA: Diagnosis not present

## 2023-09-28 DIAGNOSIS — E039 Hypothyroidism, unspecified: Secondary | ICD-10-CM | POA: Diagnosis not present

## 2023-09-28 DIAGNOSIS — G8911 Acute pain due to trauma: Secondary | ICD-10-CM | POA: Diagnosis not present

## 2023-09-28 DIAGNOSIS — R918 Other nonspecific abnormal finding of lung field: Secondary | ICD-10-CM | POA: Diagnosis not present

## 2023-09-28 DIAGNOSIS — M1712 Unilateral primary osteoarthritis, left knee: Secondary | ICD-10-CM | POA: Diagnosis not present

## 2023-09-28 DIAGNOSIS — F32A Depression, unspecified: Secondary | ICD-10-CM | POA: Diagnosis not present

## 2023-09-28 DIAGNOSIS — M50021 Cervical disc disorder at C4-C5 level with myelopathy: Secondary | ICD-10-CM | POA: Diagnosis not present

## 2023-09-28 DIAGNOSIS — I10 Essential (primary) hypertension: Secondary | ICD-10-CM | POA: Diagnosis not present

## 2023-10-03 DIAGNOSIS — E039 Hypothyroidism, unspecified: Secondary | ICD-10-CM | POA: Diagnosis not present

## 2023-10-03 DIAGNOSIS — K5900 Constipation, unspecified: Secondary | ICD-10-CM | POA: Diagnosis not present

## 2023-10-03 DIAGNOSIS — R918 Other nonspecific abnormal finding of lung field: Secondary | ICD-10-CM | POA: Diagnosis not present

## 2023-10-03 DIAGNOSIS — M50021 Cervical disc disorder at C4-C5 level with myelopathy: Secondary | ICD-10-CM | POA: Diagnosis not present

## 2023-10-03 DIAGNOSIS — M1712 Unilateral primary osteoarthritis, left knee: Secondary | ICD-10-CM | POA: Diagnosis not present

## 2023-10-03 DIAGNOSIS — I10 Essential (primary) hypertension: Secondary | ICD-10-CM | POA: Diagnosis not present

## 2023-10-03 DIAGNOSIS — G8911 Acute pain due to trauma: Secondary | ICD-10-CM | POA: Diagnosis not present

## 2023-10-03 DIAGNOSIS — F32A Depression, unspecified: Secondary | ICD-10-CM | POA: Diagnosis not present

## 2023-10-03 DIAGNOSIS — E785 Hyperlipidemia, unspecified: Secondary | ICD-10-CM | POA: Diagnosis not present

## 2023-10-07 DIAGNOSIS — I10 Essential (primary) hypertension: Secondary | ICD-10-CM | POA: Diagnosis not present

## 2023-10-07 DIAGNOSIS — M50021 Cervical disc disorder at C4-C5 level with myelopathy: Secondary | ICD-10-CM | POA: Diagnosis not present

## 2023-10-07 DIAGNOSIS — E039 Hypothyroidism, unspecified: Secondary | ICD-10-CM | POA: Diagnosis not present

## 2023-10-07 DIAGNOSIS — K5900 Constipation, unspecified: Secondary | ICD-10-CM | POA: Diagnosis not present

## 2023-10-07 DIAGNOSIS — M1712 Unilateral primary osteoarthritis, left knee: Secondary | ICD-10-CM | POA: Diagnosis not present

## 2023-10-07 DIAGNOSIS — G8911 Acute pain due to trauma: Secondary | ICD-10-CM | POA: Diagnosis not present

## 2023-10-07 DIAGNOSIS — F4321 Adjustment disorder with depressed mood: Secondary | ICD-10-CM | POA: Diagnosis not present

## 2023-10-07 DIAGNOSIS — R918 Other nonspecific abnormal finding of lung field: Secondary | ICD-10-CM | POA: Diagnosis not present

## 2023-10-07 DIAGNOSIS — E785 Hyperlipidemia, unspecified: Secondary | ICD-10-CM | POA: Diagnosis not present

## 2023-10-07 DIAGNOSIS — F32A Depression, unspecified: Secondary | ICD-10-CM | POA: Diagnosis not present

## 2023-10-11 DIAGNOSIS — D519 Vitamin B12 deficiency anemia, unspecified: Secondary | ICD-10-CM | POA: Diagnosis not present

## 2023-10-11 DIAGNOSIS — I119 Hypertensive heart disease without heart failure: Secondary | ICD-10-CM | POA: Diagnosis not present

## 2023-10-11 DIAGNOSIS — R2681 Unsteadiness on feet: Secondary | ICD-10-CM | POA: Diagnosis not present

## 2023-10-11 DIAGNOSIS — M1712 Unilateral primary osteoarthritis, left knee: Secondary | ICD-10-CM | POA: Diagnosis not present

## 2023-10-17 DIAGNOSIS — I10 Essential (primary) hypertension: Secondary | ICD-10-CM | POA: Diagnosis not present

## 2023-10-17 DIAGNOSIS — E785 Hyperlipidemia, unspecified: Secondary | ICD-10-CM | POA: Diagnosis not present

## 2023-10-17 DIAGNOSIS — R269 Unspecified abnormalities of gait and mobility: Secondary | ICD-10-CM | POA: Diagnosis not present

## 2023-10-17 DIAGNOSIS — M1712 Unilateral primary osteoarthritis, left knee: Secondary | ICD-10-CM | POA: Diagnosis not present

## 2023-10-18 DIAGNOSIS — Z9181 History of falling: Secondary | ICD-10-CM | POA: Diagnosis not present

## 2023-10-18 DIAGNOSIS — R42 Dizziness and giddiness: Secondary | ICD-10-CM | POA: Diagnosis not present

## 2023-10-18 DIAGNOSIS — M6281 Muscle weakness (generalized): Secondary | ICD-10-CM | POA: Diagnosis not present

## 2023-10-18 DIAGNOSIS — W010XXA Fall on same level from slipping, tripping and stumbling without subsequent striking against object, initial encounter: Secondary | ICD-10-CM | POA: Diagnosis not present

## 2023-10-24 DIAGNOSIS — F411 Generalized anxiety disorder: Secondary | ICD-10-CM | POA: Diagnosis not present

## 2023-10-24 DIAGNOSIS — F0390 Unspecified dementia without behavioral disturbance: Secondary | ICD-10-CM | POA: Diagnosis not present

## 2023-10-26 DIAGNOSIS — M1712 Unilateral primary osteoarthritis, left knee: Secondary | ICD-10-CM | POA: Diagnosis not present

## 2023-10-26 DIAGNOSIS — R269 Unspecified abnormalities of gait and mobility: Secondary | ICD-10-CM | POA: Diagnosis not present

## 2023-10-31 DIAGNOSIS — I10 Essential (primary) hypertension: Secondary | ICD-10-CM | POA: Diagnosis not present

## 2023-10-31 DIAGNOSIS — G3184 Mild cognitive impairment, so stated: Secondary | ICD-10-CM | POA: Diagnosis not present

## 2023-10-31 DIAGNOSIS — M6281 Muscle weakness (generalized): Secondary | ICD-10-CM | POA: Diagnosis not present

## 2023-11-01 DIAGNOSIS — R410 Disorientation, unspecified: Secondary | ICD-10-CM | POA: Diagnosis not present

## 2023-11-01 DIAGNOSIS — R42 Dizziness and giddiness: Secondary | ICD-10-CM | POA: Diagnosis not present

## 2023-11-01 DIAGNOSIS — R5383 Other fatigue: Secondary | ICD-10-CM | POA: Diagnosis not present

## 2023-11-01 DIAGNOSIS — Z87440 Personal history of urinary (tract) infections: Secondary | ICD-10-CM | POA: Diagnosis not present

## 2023-11-02 DIAGNOSIS — N390 Urinary tract infection, site not specified: Secondary | ICD-10-CM | POA: Diagnosis not present

## 2023-11-04 DIAGNOSIS — F4321 Adjustment disorder with depressed mood: Secondary | ICD-10-CM | POA: Diagnosis not present

## 2023-11-04 DIAGNOSIS — R911 Solitary pulmonary nodule: Secondary | ICD-10-CM | POA: Diagnosis not present

## 2023-11-04 DIAGNOSIS — M1712 Unilateral primary osteoarthritis, left knee: Secondary | ICD-10-CM | POA: Diagnosis not present

## 2023-11-08 DIAGNOSIS — R911 Solitary pulmonary nodule: Secondary | ICD-10-CM | POA: Diagnosis not present

## 2023-11-08 DIAGNOSIS — R2681 Unsteadiness on feet: Secondary | ICD-10-CM | POA: Diagnosis not present

## 2023-11-08 DIAGNOSIS — F0390 Unspecified dementia without behavioral disturbance: Secondary | ICD-10-CM | POA: Diagnosis not present

## 2023-11-09 DIAGNOSIS — R42 Dizziness and giddiness: Secondary | ICD-10-CM | POA: Diagnosis not present

## 2023-11-15 DIAGNOSIS — R918 Other nonspecific abnormal finding of lung field: Secondary | ICD-10-CM | POA: Diagnosis not present

## 2023-11-17 DIAGNOSIS — Z886 Allergy status to analgesic agent status: Secondary | ICD-10-CM | POA: Diagnosis not present

## 2023-11-17 DIAGNOSIS — T148XXA Other injury of unspecified body region, initial encounter: Secondary | ICD-10-CM | POA: Diagnosis not present

## 2023-11-17 DIAGNOSIS — E785 Hyperlipidemia, unspecified: Secondary | ICD-10-CM | POA: Diagnosis not present

## 2023-11-17 DIAGNOSIS — M25562 Pain in left knee: Secondary | ICD-10-CM | POA: Diagnosis not present

## 2023-11-17 DIAGNOSIS — M545 Low back pain, unspecified: Secondary | ICD-10-CM | POA: Diagnosis not present

## 2023-11-17 DIAGNOSIS — W0110XA Fall on same level from slipping, tripping and stumbling with subsequent striking against unspecified object, initial encounter: Secondary | ICD-10-CM | POA: Diagnosis not present

## 2023-11-17 DIAGNOSIS — I10 Essential (primary) hypertension: Secondary | ICD-10-CM | POA: Diagnosis not present

## 2023-11-17 DIAGNOSIS — S0990XA Unspecified injury of head, initial encounter: Secondary | ICD-10-CM | POA: Diagnosis not present

## 2023-11-17 DIAGNOSIS — Z882 Allergy status to sulfonamides status: Secondary | ICD-10-CM | POA: Diagnosis not present

## 2023-11-17 DIAGNOSIS — Z881 Allergy status to other antibiotic agents status: Secondary | ICD-10-CM | POA: Diagnosis not present

## 2023-11-17 DIAGNOSIS — S2020XA Contusion of thorax, unspecified, initial encounter: Secondary | ICD-10-CM | POA: Diagnosis not present

## 2023-11-17 DIAGNOSIS — Z888 Allergy status to other drugs, medicaments and biological substances status: Secondary | ICD-10-CM | POA: Diagnosis not present

## 2023-11-17 DIAGNOSIS — R519 Headache, unspecified: Secondary | ICD-10-CM | POA: Diagnosis not present

## 2023-11-17 DIAGNOSIS — Z79899 Other long term (current) drug therapy: Secondary | ICD-10-CM | POA: Diagnosis not present

## 2023-11-17 DIAGNOSIS — W19XXXA Unspecified fall, initial encounter: Secondary | ICD-10-CM | POA: Diagnosis not present

## 2023-11-18 DIAGNOSIS — I10 Essential (primary) hypertension: Secondary | ICD-10-CM | POA: Diagnosis not present

## 2023-11-18 DIAGNOSIS — M6281 Muscle weakness (generalized): Secondary | ICD-10-CM | POA: Diagnosis not present

## 2023-11-18 DIAGNOSIS — W19XXXA Unspecified fall, initial encounter: Secondary | ICD-10-CM | POA: Diagnosis not present

## 2023-11-21 DIAGNOSIS — F320 Major depressive disorder, single episode, mild: Secondary | ICD-10-CM | POA: Diagnosis not present

## 2023-11-21 DIAGNOSIS — F0390 Unspecified dementia without behavioral disturbance: Secondary | ICD-10-CM | POA: Diagnosis not present

## 2023-11-25 DIAGNOSIS — F4321 Adjustment disorder with depressed mood: Secondary | ICD-10-CM | POA: Diagnosis not present

## 2023-12-06 DIAGNOSIS — M1712 Unilateral primary osteoarthritis, left knee: Secondary | ICD-10-CM | POA: Diagnosis not present

## 2023-12-06 DIAGNOSIS — M11262 Other chondrocalcinosis, left knee: Secondary | ICD-10-CM | POA: Diagnosis not present

## 2023-12-06 DIAGNOSIS — Z791 Long term (current) use of non-steroidal anti-inflammatories (NSAID): Secondary | ICD-10-CM | POA: Diagnosis not present

## 2023-12-06 DIAGNOSIS — I10 Essential (primary) hypertension: Secondary | ICD-10-CM | POA: Diagnosis not present

## 2023-12-06 DIAGNOSIS — F4321 Adjustment disorder with depressed mood: Secondary | ICD-10-CM | POA: Diagnosis not present

## 2023-12-15 DIAGNOSIS — F320 Major depressive disorder, single episode, mild: Secondary | ICD-10-CM | POA: Diagnosis not present

## 2023-12-15 DIAGNOSIS — I10 Essential (primary) hypertension: Secondary | ICD-10-CM | POA: Diagnosis not present

## 2023-12-16 DIAGNOSIS — D519 Vitamin B12 deficiency anemia, unspecified: Secondary | ICD-10-CM | POA: Diagnosis not present

## 2023-12-16 DIAGNOSIS — M6281 Muscle weakness (generalized): Secondary | ICD-10-CM | POA: Diagnosis not present

## 2023-12-16 DIAGNOSIS — E559 Vitamin D deficiency, unspecified: Secondary | ICD-10-CM | POA: Diagnosis not present

## 2023-12-23 DIAGNOSIS — F320 Major depressive disorder, single episode, mild: Secondary | ICD-10-CM | POA: Diagnosis not present

## 2023-12-23 DIAGNOSIS — M1712 Unilateral primary osteoarthritis, left knee: Secondary | ICD-10-CM | POA: Diagnosis not present

## 2023-12-23 DIAGNOSIS — R269 Unspecified abnormalities of gait and mobility: Secondary | ICD-10-CM | POA: Diagnosis not present
